# Patient Record
Sex: Female | Born: 1950 | Race: Black or African American | Hispanic: No | Smoking: Former smoker
Health system: Southern US, Community
[De-identification: ages and names within clinical notes are randomized; demographics above are authoritative.]

## PROBLEM LIST (undated history)

## (undated) DIAGNOSIS — I10 Essential (primary) hypertension: Secondary | ICD-10-CM

## (undated) DIAGNOSIS — M009 Pyogenic arthritis, unspecified: Secondary | ICD-10-CM

## (undated) DIAGNOSIS — E669 Obesity, unspecified: Secondary | ICD-10-CM

## (undated) DIAGNOSIS — E119 Type 2 diabetes mellitus without complications: Secondary | ICD-10-CM

## (undated) DIAGNOSIS — Z9889 Other specified postprocedural states: Secondary | ICD-10-CM

## (undated) DIAGNOSIS — N3281 Overactive bladder: Secondary | ICD-10-CM

## (undated) DIAGNOSIS — I639 Cerebral infarction, unspecified: Secondary | ICD-10-CM

## (undated) DIAGNOSIS — M1990 Unspecified osteoarthritis, unspecified site: Secondary | ICD-10-CM

## (undated) DIAGNOSIS — E785 Hyperlipidemia, unspecified: Secondary | ICD-10-CM

## (undated) DIAGNOSIS — G4733 Obstructive sleep apnea (adult) (pediatric): Secondary | ICD-10-CM

## (undated) DIAGNOSIS — Z973 Presence of spectacles and contact lenses: Secondary | ICD-10-CM

## (undated) DIAGNOSIS — R7303 Prediabetes: Secondary | ICD-10-CM

## (undated) HISTORY — DX: Obesity, unspecified: E66.9

## (undated) HISTORY — PX: TOTAL KNEE ARTHROPLASTY: SHX125

## (undated) HISTORY — DX: Hyperlipidemia, unspecified: E78.5

## (undated) HISTORY — DX: Essential (primary) hypertension: I10

## (undated) HISTORY — PX: JOINT REPLACEMENT: SHX530

## (undated) HISTORY — DX: Other specified postprocedural states: Z98.890

## (undated) HISTORY — DX: Obstructive sleep apnea (adult) (pediatric): G47.33

## (undated) HISTORY — PX: TUBAL LIGATION: SHX77

## (undated) HISTORY — DX: Type 2 diabetes mellitus without complications: E11.9

## (undated) HISTORY — DX: Cerebral infarction, unspecified: I63.9

## (undated) HISTORY — DX: Pyogenic arthritis, unspecified: M00.9

## (undated) HISTORY — PX: COLONOSCOPY: SHX174

## (undated) HISTORY — PX: DIAGNOSTIC LAPAROSCOPY: SUR761

## (undated) HISTORY — PX: OTHER SURGICAL HISTORY: SHX169

---

## 1999-07-27 ENCOUNTER — Encounter: Payer: Self-pay | Admitting: Sports Medicine

## 1999-07-27 ENCOUNTER — Ambulatory Visit (HOSPITAL_COMMUNITY): Admission: RE | Admit: 1999-07-27 | Discharge: 1999-07-27 | Payer: Self-pay | Admitting: Sports Medicine

## 2001-08-09 ENCOUNTER — Ambulatory Visit (HOSPITAL_COMMUNITY): Admission: RE | Admit: 2001-08-09 | Discharge: 2001-08-09 | Payer: Self-pay | Admitting: Family Medicine

## 2001-08-15 ENCOUNTER — Encounter: Payer: Self-pay | Admitting: Family Medicine

## 2001-08-15 ENCOUNTER — Encounter: Admission: RE | Admit: 2001-08-15 | Discharge: 2001-08-15 | Payer: Self-pay | Admitting: Family Medicine

## 2004-06-21 ENCOUNTER — Emergency Department (HOSPITAL_COMMUNITY): Admission: EM | Admit: 2004-06-21 | Discharge: 2004-06-21 | Payer: Self-pay | Admitting: Emergency Medicine

## 2005-09-17 ENCOUNTER — Emergency Department (HOSPITAL_COMMUNITY): Admission: EM | Admit: 2005-09-17 | Discharge: 2005-09-17 | Payer: Self-pay | Admitting: Family Medicine

## 2005-10-29 ENCOUNTER — Encounter: Admission: RE | Admit: 2005-10-29 | Discharge: 2005-10-29 | Payer: Self-pay | Admitting: Orthopedic Surgery

## 2006-03-31 ENCOUNTER — Ambulatory Visit (HOSPITAL_BASED_OUTPATIENT_CLINIC_OR_DEPARTMENT_OTHER): Admission: RE | Admit: 2006-03-31 | Discharge: 2006-03-31 | Payer: Self-pay | Admitting: Orthopedic Surgery

## 2006-05-10 ENCOUNTER — Ambulatory Visit: Payer: Self-pay

## 2008-11-14 ENCOUNTER — Encounter: Admission: RE | Admit: 2008-11-14 | Discharge: 2008-11-14 | Payer: Self-pay | Admitting: Family Medicine

## 2008-11-29 ENCOUNTER — Encounter: Admission: RE | Admit: 2008-11-29 | Discharge: 2009-01-27 | Payer: Self-pay | Admitting: Sports Medicine

## 2009-07-30 ENCOUNTER — Inpatient Hospital Stay (HOSPITAL_COMMUNITY): Admission: RE | Admit: 2009-07-30 | Discharge: 2009-08-04 | Payer: Self-pay | Admitting: Orthopedic Surgery

## 2009-09-11 ENCOUNTER — Encounter: Admission: RE | Admit: 2009-09-11 | Discharge: 2009-11-06 | Payer: Self-pay | Admitting: Orthopedic Surgery

## 2009-10-15 ENCOUNTER — Other Ambulatory Visit: Admission: RE | Admit: 2009-10-15 | Discharge: 2009-10-15 | Payer: Self-pay | Admitting: Family Medicine

## 2009-10-21 ENCOUNTER — Encounter: Admission: RE | Admit: 2009-10-21 | Discharge: 2009-10-21 | Payer: Self-pay | Admitting: Family Medicine

## 2009-10-24 ENCOUNTER — Encounter: Admission: RE | Admit: 2009-10-24 | Discharge: 2009-10-24 | Payer: Self-pay | Admitting: Family Medicine

## 2009-11-12 ENCOUNTER — Encounter: Admission: RE | Admit: 2009-11-12 | Discharge: 2009-11-12 | Payer: Self-pay | Admitting: Family Medicine

## 2010-04-22 ENCOUNTER — Other Ambulatory Visit: Payer: Self-pay | Admitting: Family Medicine

## 2010-04-22 ENCOUNTER — Ambulatory Visit
Admission: RE | Admit: 2010-04-22 | Discharge: 2010-04-22 | Disposition: A | Discharge status: Home / Self Care | Payer: Self-pay | Source: Ambulatory Visit | Attending: Family Medicine | Admitting: Family Medicine

## 2010-04-22 DIAGNOSIS — R109 Unspecified abdominal pain: Secondary | ICD-10-CM

## 2010-05-03 LAB — CBC
HCT: 30.2 % — ABNORMAL LOW (ref 36.0–46.0)
HCT: 31.9 % — ABNORMAL LOW (ref 36.0–46.0)
Hemoglobin: 10.2 g/dL — ABNORMAL LOW (ref 12.0–15.0)
Hemoglobin: 10.6 g/dL — ABNORMAL LOW (ref 12.0–15.0)
Hemoglobin: 10.7 g/dL — ABNORMAL LOW (ref 12.0–15.0)
MCHC: 33.9 g/dL (ref 30.0–36.0)
MCHC: 34.1 g/dL (ref 30.0–36.0)
MCV: 83.6 fL (ref 78.0–100.0)
Platelets: 192 10*3/uL (ref 150–400)
Platelets: 244 10*3/uL (ref 150–400)
RBC: 3.58 MIL/uL — ABNORMAL LOW (ref 3.87–5.11)
RBC: 3.64 MIL/uL — ABNORMAL LOW (ref 3.87–5.11)
RBC: 3.75 MIL/uL — ABNORMAL LOW (ref 3.87–5.11)
RDW: 14.3 % (ref 11.5–15.5)
WBC: 7.7 10*3/uL (ref 4.0–10.5)
WBC: 8.2 10*3/uL (ref 4.0–10.5)
WBC: 9 10*3/uL (ref 4.0–10.5)

## 2010-05-03 LAB — BASIC METABOLIC PANEL
BUN: 5 mg/dL — ABNORMAL LOW (ref 6–23)
BUN: 7 mg/dL (ref 6–23)
CO2: 25 mEq/L (ref 19–32)
Calcium: 8.5 mg/dL (ref 8.4–10.5)
Calcium: 8.6 mg/dL (ref 8.4–10.5)
Calcium: 9 mg/dL (ref 8.4–10.5)
Chloride: 102 mEq/L (ref 96–112)
Creatinine, Ser: 0.64 mg/dL (ref 0.4–1.2)
Creatinine, Ser: 0.68 mg/dL (ref 0.4–1.2)
Creatinine, Ser: 0.73 mg/dL (ref 0.4–1.2)
GFR calc Af Amer: >60 mL/min (ref >60)
GFR calc Af Amer: >60 mL/min (ref >60)
GFR calc Af Amer: >60 mL/min (ref >60)
GFR calc non Af Amer: >60 mL/min (ref >60)
GFR calc non Af Amer: >60 mL/min (ref >60)
GFR calc non Af Amer: >60 mL/min (ref >60)
GFR calc non Af Amer: >60 mL/min (ref >60)
Glucose, Bld: 138 mg/dL — ABNORMAL HIGH (ref 70–99)
Glucose, Bld: 156 mg/dL — ABNORMAL HIGH (ref 70–99)
Potassium: 3.4 mEq/L — ABNORMAL LOW (ref 3.5–5.1)
Potassium: 3.8 mEq/L (ref 3.5–5.1)
Potassium: 3.8 mEq/L (ref 3.5–5.1)
Potassium: 4 mEq/L (ref 3.5–5.1)
Sodium: 137 mEq/L (ref 135–145)
Sodium: 137 mEq/L (ref 135–145)
Sodium: 138 mEq/L (ref 135–145)
Sodium: 139 mEq/L (ref 135–145)

## 2010-05-03 LAB — PROTIME-INR
INR: 1.39 (ref 0.00–1.49)
INR: 1.46 (ref 0.00–1.49)
INR: 1.79 — ABNORMAL HIGH (ref 0.00–1.49)
Prothrombin Time: 16.9 seconds — ABNORMAL HIGH (ref 11.6–15.2)
Prothrombin Time: 17.6 seconds — ABNORMAL HIGH (ref 11.6–15.2)
Prothrombin Time: 18.3 seconds — ABNORMAL HIGH (ref 11.6–15.2)

## 2010-05-03 LAB — URINE CULTURE: Culture: NO GROWTH

## 2010-05-04 LAB — COMPREHENSIVE METABOLIC PANEL
Albumin: 4 g/dL (ref 3.5–5.2)
Alkaline Phosphatase: 95 U/L (ref 39–117)
BUN: 7 mg/dL (ref 6–23)
CO2: 25 mEq/L (ref 19–32)
Chloride: 107 mEq/L (ref 96–112)
Creatinine, Ser: 0.68 mg/dL (ref 0.4–1.2)
GFR calc non Af Amer: >60 mL/min (ref >60)
Glucose, Bld: 99 mg/dL (ref 70–99)
Potassium: 4.2 mEq/L (ref 3.5–5.1)
Total Bilirubin: 0.8 mg/dL (ref 0.3–1.2)

## 2010-05-04 LAB — URINALYSIS, ROUTINE W REFLEX MICROSCOPIC
Ketones, ur: NEGATIVE mg/dL
Nitrite: NEGATIVE
Specific Gravity, Urine: 1.017 (ref 1.005–1.030)
pH: 5 (ref 5.0–8.0)

## 2010-05-04 LAB — CBC
HCT: 41 % (ref 36.0–46.0)
Hemoglobin: 13.6 g/dL (ref 12.0–15.0)
MCV: 83.4 fL (ref 78.0–100.0)
Platelets: 253 10*3/uL (ref 150–400)
RBC: 4.92 MIL/uL (ref 3.87–5.11)
WBC: 6.2 10*3/uL (ref 4.0–10.5)

## 2010-05-04 LAB — URINE MICROSCOPIC-ADD ON

## 2010-05-04 LAB — TYPE AND SCREEN

## 2010-05-04 LAB — PROTIME-INR: INR: 0.99 (ref 0.00–1.49)

## 2010-06-30 NOTE — Procedures (Signed)
NAMEMERCEDEES, CONVERY             ACCOUNT NO.:  [id number]   MEDICAL RECORD NO.:  [id number]          PATIENT TYPE:  INP   LOCATION:  5025                         FACILITY:  [facility]   PHYSICIAN:  Loreta Ave, M.D. DATE OF BIRTH:  [birth year]   DATE OF PROCEDURE:  DATE OF DISCHARGE:  08/01/2009                    PERIPHERAL VASCULAR INVASIVE PROCEDURE   FINAL DIAGNOSES:  1. 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  2. Hypertension.  3. Morbid obesity.   HISTORY OF PRESENT ILLNESS:  A 60 year old white female with a history  of end-stage DJD left knee and chronic pain presented to our office for  a preop evaluation for left total knee replacement.  She had  progressively worsening pain with failure to respond with conservative  treatment.  Significant decrease in her daily activities due to the  ongoing complaint.   HOSPITAL COURSE:  On July 30, 2009, the patient was taken to the [facility] OR and a left total knee replacement procedure performed.  Surgeon,  Loreta Ave, M.D. and assistant Genene Churn. Barry Dienes, PA-C.  Anesthesia  general.  EBL 200 mL.  Tourniquet time 92 minutes.  One Hemovac drain  placed.  There were no surgical or anesthesia complications and the  patient was transferred to recovery in stable condition.  Pharmacy  protocol Coumadin started for DVT prophylaxis along with Lovenox bridge.  On July 31, 2009, patient doing well.  Pain controlled.  Vital signs  stable, afebrile.  WBC 7.7, hematocrit 31.9, hemoglobin 10.7, platelets  217, sodium 137, potassium 4.0, chloride 106, CO2 of 27, BUN 7,  creatinine 0.86, glucose 138, INR 1.07.  Dressing clean, dry and intact.  Neurovascularly intact.  The patient lives alone and needs skilled  nursing facility for rehab.  The patient did visit [facility] and  request to be transferred there if bed available.  Discontinued morphine  PCA.  PT/OT consults.  FL2 signed.  On  August 01, 2009, the patient doing  very well.  No complaints.  Vital signs stable, afebrile.  INR 1.39.  Urine culture showed no growth.  CBC and BMET pending.  Knee wound looks  good and staples intact.  No signs of infection.  Calf nontender  neurovascularly.  Hemovac drain removed.   DISPOSITION:  Transferred to skilled nursing facility.   CONDITION:  Good and stable.   MEDICATIONS:  1. Percocet 5/325 one to two tabs p.o. q.4-6 h., p.r.n. for pain.  2. Robaxin 500 mg 1 tab p.o. q.6 h., p.r.n. for spasms.  3. Coumadin per pharmacy protocol.  Maintain INR 2-3 x4 weeks      postoperative DVT prophylaxis.  4. Lovenox 30 mg 1 subcu injection b.i.d. and discontinue when      Coumadin is therapeutic with INR 2-3.  5. Simvastatin 40 mg 1 tab p.o. daily.  6. Lisinopril/hydrochlorothiazide 10/12.5 mg 1 tab p.o. daily.  7. Oxybutynin 5 mg 1 tab p.o. b.i.d.  8. Metoprolol tartrate 100 mg 1 tab p.o. b.i.d.   INSTRUCTIONS:  While at the skilled nursing facility, the patient will  continue PT/OT for knee  range of motion and strengthening and  ambulation.  She is weightbearing as tolerated.  Can start with a walker  and wean to a cane if tolerated.  Coumadin x4 weeks postoperatively for  DVT prophylaxis.  Discontinue Lovenox when the Coumadin is  therapeutic.  She is okay to shower, but no tub soaking.  Do not apply  any creams or ointments to her incision.  Staples to be removed 2 weeks  postop.  Daily dressing changes with 4 x 4 gauze and tape.  Follow up  with Dr. Eulah Pont when she is 2 weeks postop for recheck.  Call our office  at [phone] if there are any issues.      Genene Churn. Denton Meek.    ______________________________  Loreta Ave, M.D.    JMO/MEDQ  D:  08/01/2009  T:  08/01/2009  Job:  [id number]

## 2010-07-03 NOTE — Op Note (Signed)
[name], [name] [name]             ACCOUNT NO.:  [id number]   MEDICAL RECORD NO.:  [id number]          PATIENT TYPE:  AMB   LOCATION:  DSC                          FACILITY:  [facility]   PHYSICIAN:  Loreta Ave, M.D. 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   PROCEDURE PERFORMED:  Left knee examination under anesthesia,  arthroscopy, with partial medial meniscectomy.  Tricompartmental  debridement and removal of loose bodies and chondroplasty.   SURGEON:  Loreta Ave, M.D.   ASSISTANT:  Zonia Kief, P.A.   ANESTHESIA:  General.   ESTIMATED BLOOD LOSS:  Minimal.   TOURNIQUET TIME:  Not employed.   SPECIMENS:  None.   CULTURES:  None.   COMPLICATIONS:  None.   DRESSINGS:  Soft compressive.   DESCRIPTION OF PROCEDURE:  The patient was brought to the operating room  and placed on the operating table in supine position.  After adequate  anesthesia had been obtained, the knee examined.  Very limited by  exogenous obesity.  She does have full motion and reasonable stability  but with crepitus, mostly of the patellofemoral compartment.  Tourniquet  leg holder applied, the leg prepped and draped in the usual sterile  fashion.  Three portals created:  One superolateral, one each medial and  lateral parapatellar.  Inflow catheter introduced.  The knee was  distended, arthroscope introduced, knee inspected.  Grade 3 changes  diffusely medial compartment and patellofemoral joint.  Chondroplasty to  a stable surface.  Knocked down to grade 4.  The cruciate ligament was  intact.  Some spurring in both gutters and in the notch.  Lateral  meniscus, lateral compartment looked good.   Medial meniscus, a  detachment of the posterior horn, radial tear, irreparable.  This was  saucerized out and tapered in  smoothly, retaining a little bit of the rim in the back and tapered into  remaining meniscus.  At completion, all recesses examined to be sure all  loose fragments removed.  Instruments and fluid removed.  Portals in the  knee injected with Marcaine.  Portals closed with 4-0 nylon.  A sterile  compressive dressing applied.      Loreta Ave, M.D.  Electronically Signed     DFM/MEDQ  D:  03/31/2006  T:  04/01/2006  Job:  [id number]

## 2010-07-17 ENCOUNTER — Other Ambulatory Visit (HOSPITAL_COMMUNITY): Payer: Medicaid Other

## 2010-07-21 ENCOUNTER — Encounter (HOSPITAL_COMMUNITY)
Admission: RE | Admit: 2010-07-21 | Discharge: 2010-07-21 | Disposition: A | Discharge status: Home / Self Care | Payer: Medicaid Other | Source: Ambulatory Visit | Attending: Orthopedic Surgery | Admitting: Orthopedic Surgery

## 2010-07-21 ENCOUNTER — Ambulatory Visit (HOSPITAL_COMMUNITY)
Admission: RE | Admit: 2010-07-21 | Discharge: 2010-07-21 | Disposition: A | Discharge status: Home / Self Care | Payer: Medicaid Other | Source: Ambulatory Visit | Attending: Orthopedic Surgery | Admitting: Orthopedic Surgery

## 2010-07-21 ENCOUNTER — Other Ambulatory Visit (HOSPITAL_COMMUNITY): Payer: Self-pay | Admitting: Orthopedic Surgery

## 2010-07-21 DIAGNOSIS — IMO0002 Reserved for concepts with insufficient information to code with codable children: Secondary | ICD-10-CM | POA: Insufficient documentation

## 2010-07-21 DIAGNOSIS — M1711 Unilateral primary osteoarthritis, right knee: Secondary | ICD-10-CM

## 2010-07-21 DIAGNOSIS — Z01818 Encounter for other preprocedural examination: Secondary | ICD-10-CM | POA: Insufficient documentation

## 2010-07-21 DIAGNOSIS — M1710 Unilateral primary osteoarthritis, unspecified knee: Secondary | ICD-10-CM | POA: Insufficient documentation

## 2010-07-21 DIAGNOSIS — Z01810 Encounter for preprocedural cardiovascular examination: Secondary | ICD-10-CM | POA: Insufficient documentation

## 2010-07-21 DIAGNOSIS — Z01812 Encounter for preprocedural laboratory examination: Secondary | ICD-10-CM | POA: Insufficient documentation

## 2010-07-21 LAB — COMPREHENSIVE METABOLIC PANEL
ALT: 20 U/L (ref 0–35)
AST: 19 U/L (ref 0–37)
CO2: 28 mEq/L (ref 19–32)
Chloride: 106 mEq/L (ref 96–112)
Creatinine, Ser: 0.89 mg/dL (ref 0.4–1.2)
GFR calc Af Amer: >60 mL/min (ref >60)
GFR calc non Af Amer: >60 mL/min (ref >60)
Glucose, Bld: 119 mg/dL — ABNORMAL HIGH (ref 70–99)
Sodium: 144 mEq/L (ref 135–145)
Total Bilirubin: 0.3 mg/dL (ref 0.3–1.2)

## 2010-07-21 LAB — SURGICAL PCR SCREEN
MRSA, PCR: NEGATIVE
Staphylococcus aureus: POSITIVE — AB

## 2010-07-21 LAB — URINALYSIS, ROUTINE W REFLEX MICROSCOPIC
Bilirubin Urine: NEGATIVE
Ketones, ur: NEGATIVE mg/dL
Nitrite: NEGATIVE
Urobilinogen, UA: 0.2 mg/dL (ref 0.0–1.0)

## 2010-07-21 LAB — CBC
HCT: 41.3 % (ref 36.0–46.0)
Hemoglobin: 13.4 g/dL (ref 12.0–15.0)
MCH: 27.7 pg (ref 26.0–34.0)
RBC: 4.83 MIL/uL (ref 3.87–5.11)

## 2010-07-21 LAB — PROTIME-INR
INR: 0.91 (ref 0.00–1.49)
Prothrombin Time: 12.5 seconds (ref 11.6–15.2)

## 2010-07-21 LAB — APTT: aPTT: 27 seconds (ref 24–37)

## 2010-07-29 ENCOUNTER — Inpatient Hospital Stay (HOSPITAL_COMMUNITY): Payer: Medicaid Other

## 2010-07-29 ENCOUNTER — Inpatient Hospital Stay (HOSPITAL_COMMUNITY)
Admission: RE | Admit: 2010-07-29 | Discharge: 2010-08-01 | DRG: 470 | Disposition: A | Discharge status: Home Health | Payer: Medicaid Other | Source: Ambulatory Visit | Attending: Orthopedic Surgery | Admitting: Orthopedic Surgery

## 2010-07-29 DIAGNOSIS — G8929 Other chronic pain: Secondary | ICD-10-CM | POA: Diagnosis present

## 2010-07-29 DIAGNOSIS — I10 Essential (primary) hypertension: Secondary | ICD-10-CM | POA: Diagnosis present

## 2010-07-29 DIAGNOSIS — M1710 Unilateral primary osteoarthritis, unspecified knee: Principal | ICD-10-CM | POA: Diagnosis present

## 2010-07-29 DIAGNOSIS — E119 Type 2 diabetes mellitus without complications: Secondary | ICD-10-CM | POA: Diagnosis present

## 2010-07-29 DIAGNOSIS — E7800 Pure hypercholesterolemia, unspecified: Secondary | ICD-10-CM | POA: Diagnosis present

## 2010-07-29 LAB — TYPE AND SCREEN
ABO/RH(D): A POS
Antibody Screen: NEGATIVE

## 2010-07-29 LAB — URINALYSIS, ROUTINE W REFLEX MICROSCOPIC
Nitrite: NEGATIVE
Protein, ur: NEGATIVE mg/dL
Specific Gravity, Urine: 1.017 (ref 1.005–1.030)
Urobilinogen, UA: 0.2 mg/dL (ref 0.0–1.0)

## 2010-07-29 LAB — URINE MICROSCOPIC-ADD ON

## 2010-07-30 LAB — BASIC METABOLIC PANEL
BUN: 14 mg/dL (ref 6–23)
Chloride: 106 mEq/L (ref 96–112)
GFR calc Af Amer: >60 mL/min (ref >60)
GFR calc non Af Amer: >60 mL/min (ref >60)
Potassium: 4.2 mEq/L (ref 3.5–5.1)
Sodium: 140 mEq/L (ref 135–145)

## 2010-07-30 LAB — CBC
Hemoglobin: 11.3 g/dL — ABNORMAL LOW (ref 12.0–15.0)
MCH: 27.6 pg (ref 26.0–34.0)
MCV: 87 fL (ref 78.0–100.0)
Platelets: 253 10*3/uL (ref 150–400)
RBC: 4.09 MIL/uL (ref 3.87–5.11)
WBC: 9.2 10*3/uL (ref 4.0–10.5)

## 2010-07-30 LAB — PROTIME-INR: Prothrombin Time: 13.6 seconds (ref 11.6–15.2)

## 2010-07-31 LAB — BASIC METABOLIC PANEL
Calcium: 9.2 mg/dL (ref 8.4–10.5)
Creatinine, Ser: 0.68 mg/dL (ref 0.50–1.10)
GFR calc Af Amer: >60 mL/min (ref >60)
GFR calc non Af Amer: >60 mL/min (ref >60)
Sodium: 138 mEq/L (ref 135–145)

## 2010-07-31 LAB — PROTIME-INR
INR: 1.29 (ref 0.00–1.49)
Prothrombin Time: 16.3 seconds — ABNORMAL HIGH (ref 11.6–15.2)

## 2010-07-31 LAB — CBC
HCT: 35.1 % — ABNORMAL LOW (ref 36.0–46.0)
Hemoglobin: 11.2 g/dL — ABNORMAL LOW (ref 12.0–15.0)
MCH: 27.8 pg (ref 26.0–34.0)
MCHC: 31.9 g/dL (ref 30.0–36.0)
MCV: 87.1 fL (ref 78.0–100.0)
RDW: 15.3 % (ref 11.5–15.5)

## 2010-08-01 LAB — URINE CULTURE
Colony Count: >=100000
Culture  Setup Time: 201206130951

## 2010-08-01 LAB — CBC
Platelets: 231 10*3/uL (ref 150–400)
RBC: 4.07 MIL/uL (ref 3.87–5.11)
RDW: 15.5 % (ref 11.5–15.5)
WBC: 8.5 10*3/uL (ref 4.0–10.5)

## 2010-08-01 LAB — BASIC METABOLIC PANEL
Calcium: 9.9 mg/dL (ref 8.4–10.5)
GFR calc Af Amer: >60 mL/min (ref >60)
GFR calc non Af Amer: >60 mL/min (ref >60)
Potassium: 3.9 mEq/L (ref 3.5–5.1)
Sodium: 141 mEq/L (ref 135–145)

## 2010-08-01 LAB — PROTIME-INR: Prothrombin Time: 16.9 seconds — ABNORMAL HIGH (ref 11.6–15.2)

## 2010-08-06 NOTE — Op Note (Signed)
[name] [name], [name] [name] NO.:  [id number]  MEDICAL RECORD NO.:  [id number]  LOCATION:  5012                         FACILITY:  [facility]  PHYSICIAN:  Loreta Ave, M.D. DATE OF BIRTH:  14-Jun-1950  DATE OF PROCEDURE:  07/29/2010 DATE OF DISCHARGE:                              OPERATIVE REPORT   PREOPERATIVE DIAGNOSES:  Right knee end-stage degenerative arthritis, varus alignment, mild flexion contraction, morbid obesity.  POSTOPERATIVE DIAGNOSES:  Right knee end-stage degenerative arthritis, varus alignment, mild flexion contraction, morbid obesity.  PROCEDURES:  Right knee modified minimally invasive total knee replacement Stryker triathlon prosthesis.  Cemented pegged posterior stabilized #3 femoral component.  Cemented #3 tibial component 9-mm polyethylene insert.  Cemented resurfacing 32-mm patellar component. Soft tissue balancing.  SURGEON:  Loreta Ave, MD  ASSISTANT:  Zonia Kief, PA present throughout the entire case and necessary for timely completion of procedure.  ANESTHESIA:  General.  ESTIMATED BLOOD LOSS:  Minimal.  SPECIMENS:  None.  CULTURES:  None.  COMPLICATIONS:  None.  DRESSINGS:  Soft compressive knee immobilizer.  DRAIN:  Hemovac x1.  TOURNIQUET TIME:  1 hour and 10 minutes.  PROCEDURE:  The patient was brought to the operating room, placed on the operating table in a supine position.  After adequate anesthesia had been obtained, I examined the left knee that I had already replaced. Full extension, 120 degrees of flexion, good alignment, and stability. Attention turned to the right.  All positioning approach of operative procedure made more difficult by her morbid obesity.  Tourniquet applied.  Prepped and draped in usual sterile fashion.  Exsanguinated with elevation Esmarch.  Tourniquet was inflated to 350 mmHg.  Straight incision above the patella down to the tibial tubercle.  Medial arthrotomy vastus splitting  preserving quad tendon.  Knee exposed. Grade 4 changes throughout.  Medial capsular release.  Distal femur exposed.  10-mm resection, 5 degrees of valgus with the intramedullary guide.  Using epicondylar axis sized, cut, and fitted for posterior stabilized #3 component.  Attention turned to the tibia.  Extramedullary guide.  3-degree posterior slope cut below the defect medially for a 9- mm insert.  After that was complete, the knee was irrigated throughout. All recesses were explored to be sure all fragments of menisci, loose body, and cruciate ligaments removed.  I looked under gap in flexion/extension, which was nicely balanced.  Attention turned to the patella.  Periarticular spurs removed.  Posterior 10 mm removed. Drilled, sized, and fitted for a 32-mm component.  Wound irrigated. Trials put in place.  #3 above and below and a 9-mm insert and a 32 patella.  With this I had a nice mechanical axis full extension, full flexion, good patellofemoral tracking, good balancing, flexion/extension.  Tibia was marked for rotation and reamed.  All trials removed.  Copious irrigation with a pulse irrigating device. Cement prepared, placed on all components firmly seated.  Polyethylene attached to the tibia, knee reduced.  Patella with clamp.  Once cement hardened, the knee was reexamined.  Again pleased with alignment, stability, and motion.  Wound was irrigated once again.  Hemovac placed and brought out through a separate stab wound.  Arthrotomy closed with #1 Vicryl.  Skin and subcutaneous tissue with Vicryl and staples. Sterile compressive dressing applied.  Tourniquet was inflated and removed.  Knee immobilizer applied.  Anesthesia reversed.  Brought to the recovery room.  Tolerated surgery well.  No complications.     Loreta Ave, M.D.     DFM/MEDQ  D:  07/29/2010  T:  07/30/2010  Job:  [id number]  Electronically Signed by Mckinley Jewel M.D. on 08/06/2010 04:11:12 PM

## 2010-09-14 ENCOUNTER — Ambulatory Visit: Payer: Medicaid Other | Attending: Orthopedic Surgery | Admitting: Physical Therapy

## 2010-09-14 ENCOUNTER — Ambulatory Visit: Payer: Medicaid Other | Admitting: Physical Therapy

## 2010-09-14 DIAGNOSIS — IMO0001 Reserved for inherently not codable concepts without codable children: Secondary | ICD-10-CM | POA: Insufficient documentation

## 2010-09-14 DIAGNOSIS — M25569 Pain in unspecified knee: Secondary | ICD-10-CM | POA: Insufficient documentation

## 2010-09-14 DIAGNOSIS — R262 Difficulty in walking, not elsewhere classified: Secondary | ICD-10-CM | POA: Insufficient documentation

## 2010-09-14 DIAGNOSIS — M25669 Stiffness of unspecified knee, not elsewhere classified: Secondary | ICD-10-CM | POA: Insufficient documentation

## 2010-09-14 DIAGNOSIS — Z96659 Presence of unspecified artificial knee joint: Secondary | ICD-10-CM | POA: Insufficient documentation

## 2010-09-22 ENCOUNTER — Ambulatory Visit: Payer: Medicaid Other | Attending: Orthopedic Surgery | Admitting: Physical Therapy

## 2010-09-22 DIAGNOSIS — R262 Difficulty in walking, not elsewhere classified: Secondary | ICD-10-CM | POA: Insufficient documentation

## 2010-09-22 DIAGNOSIS — M25669 Stiffness of unspecified knee, not elsewhere classified: Secondary | ICD-10-CM | POA: Insufficient documentation

## 2010-09-22 DIAGNOSIS — IMO0001 Reserved for inherently not codable concepts without codable children: Secondary | ICD-10-CM | POA: Insufficient documentation

## 2010-09-22 DIAGNOSIS — M25569 Pain in unspecified knee: Secondary | ICD-10-CM | POA: Insufficient documentation

## 2010-09-22 DIAGNOSIS — Z96659 Presence of unspecified artificial knee joint: Secondary | ICD-10-CM | POA: Insufficient documentation

## 2010-09-28 ENCOUNTER — Encounter: Payer: Medicaid Other | Admitting: Physical Therapy

## 2010-09-29 ENCOUNTER — Ambulatory Visit: Payer: Medicaid Other | Admitting: Physical Therapy

## 2010-10-02 ENCOUNTER — Ambulatory Visit: Payer: Medicaid Other | Admitting: Physical Therapy

## 2010-10-05 ENCOUNTER — Ambulatory Visit: Payer: Medicaid Other | Admitting: Physical Therapy

## 2010-10-09 ENCOUNTER — Encounter: Payer: Medicaid Other | Admitting: Physical Therapy

## 2010-10-12 ENCOUNTER — Ambulatory Visit: Payer: Medicaid Other | Admitting: Physical Therapy

## 2010-10-16 ENCOUNTER — Ambulatory Visit: Payer: Medicaid Other | Admitting: Physical Therapy

## 2010-10-20 ENCOUNTER — Encounter: Payer: Medicaid Other | Admitting: Physical Therapy

## 2010-10-21 ENCOUNTER — Ambulatory Visit: Payer: Medicaid Other | Attending: Orthopedic Surgery | Admitting: Physical Therapy

## 2010-10-21 ENCOUNTER — Encounter (INDEPENDENT_AMBULATORY_CARE_PROVIDER_SITE_OTHER): Payer: Medicaid Other | Admitting: Ophthalmology

## 2010-10-21 DIAGNOSIS — M545 Low back pain, unspecified: Secondary | ICD-10-CM | POA: Insufficient documentation

## 2010-10-21 DIAGNOSIS — IMO0001 Reserved for inherently not codable concepts without codable children: Secondary | ICD-10-CM | POA: Insufficient documentation

## 2010-10-21 DIAGNOSIS — M25569 Pain in unspecified knee: Secondary | ICD-10-CM | POA: Insufficient documentation

## 2010-10-21 DIAGNOSIS — R5381 Other malaise: Secondary | ICD-10-CM | POA: Insufficient documentation

## 2010-10-21 DIAGNOSIS — M25669 Stiffness of unspecified knee, not elsewhere classified: Secondary | ICD-10-CM | POA: Insufficient documentation

## 2010-10-21 DIAGNOSIS — R262 Difficulty in walking, not elsewhere classified: Secondary | ICD-10-CM | POA: Insufficient documentation

## 2010-10-22 ENCOUNTER — Encounter (INDEPENDENT_AMBULATORY_CARE_PROVIDER_SITE_OTHER): Payer: Medicaid Other | Admitting: Ophthalmology

## 2010-10-22 DIAGNOSIS — H3530 Unspecified macular degeneration: Secondary | ICD-10-CM

## 2010-10-22 DIAGNOSIS — H2510 Age-related nuclear cataract, unspecified eye: Secondary | ICD-10-CM

## 2010-10-22 DIAGNOSIS — H43819 Vitreous degeneration, unspecified eye: Secondary | ICD-10-CM

## 2010-10-27 ENCOUNTER — Ambulatory Visit: Payer: Medicaid Other | Admitting: Physical Therapy

## 2010-10-27 NOTE — Discharge Summary (Signed)
NAMEADJOA, ALTHOUSE NO.:  [id number]  MEDICAL RECORD NO.:  [id number]  LOCATION:  5012                         FACILITY:  [facility]  PHYSICIAN:  Loreta Ave, M.D. DATE OF BIRTH:  [birth year]  DATE OF ADMISSION:  07/29/2010 DATE OF DISCHARGE:  08/01/2010                              DISCHARGE SUMMARY   FINAL DIAGNOSES: 1. Status post right total knee replacement for end-stage degenerative     joint disease. 2. Hypertension. 3. Type 2 diabetes.  HISTORY OF PRESENT ILLNESS:  A 60 year old black female with history of end-stage DJD of right knee and chronic pain presented to our office for preop evaluation for total knee replacement.  She had progressive and worsening pain with failure to response with conservative treatment. She had significant decrease in her daily activities due to the ongoing complaint.  HOSPITAL COURSE:  On July 29, 2010, the patient was taken to the [facility] OR, had a right total knee replacement procedure performed. Surgeon was Mckinley Jewel, MD and assistant, Zonia Kief PA-C. Anesthesia general.  No specimens.  EBL minimal.  Tourniquet time 79 minutes and one Hemovac drain placed.  There were no surgical or anesthesia complications and the patient was transferred to recovery room in stable condition.  After the recovery room, the patient was then transferred to the orthopedic unit and pharmacy protocol Coumadin and Lovenox started for DVT prophylaxis.  On July 30, 2010, the patient complained of radiating pain from her right buttock to her knee. Temperature 100.3, pulse 105, respirations 20, and blood pressure 124/68.  Hemoglobin 11.3.  INR 1.02.  Dressing clean, dry, and intact. Calf nontender, neurovascularly intact.  Skin was warm and dry.  The patient states that she does have a history of sciatica and we will see how she does with position changes, discontinue PCA, PT/OT consults.  On July 31, 2010, the patient moved into  rehab.  Pain controlled. Temperature 98.6.  Hemoglobin 11.2.  INR 1.29.  The wound looks good and staples intact.  No drainage or signs of infection.  Calf nontender, neurovascularly intact.  Skin is warm and dry.  Discontinued Foley. Encouraged the patient to work hard with rehab.  Later in afternoon, she greatly improved.  On August 01, 2010, the patient was doing well with good pain control.  Excellent progress with therapy and she is ready to discharge home.  Vital signs stable, afebrile.  Knee wound looks good and staples intact.  There are no drains or sign of infection.  Calf nontender, neurovascularly intact.  Skin was warm and dry.  DISPOSITION:  Discharged home.  CONDITION:  Good and stable.  DISCHARGE MEDICATIONS: 1. Percocet 5/325 one to two tabs p.o. q.4-6 hours p.r.n. for pain. 2. Robaxin 500 mg 1 tablet p.o. q.6 h p.r.n. for spasms. 3. Lovenox 30 mg 1 subcu injection q.12 h and discontinue when     Coumadin is therapeutic with INR 2-3. 4. Coumadin protocol for home health agency. 5. Bystolic 10 mg 1 tablet p.o. daily. 6. Simvastatin 20 mg 1 tablet p.o. daily.  INSTRUCTIONS:  The patient will work with her home health PT and OT to improve ambulation and knee range of motion and  strengthening. Weightbear as tolerated.  Daily dressing changes with 4x4 gauze and TED hose.  She is okay to shower, but no tub soaking.  Coumadin x4 weeks postop DVT prophylaxis.  Discontinue Lovenox when Coumadin is therapeutic with INR 2-3.  Follow up when she is 2 weeks postop for recheck and possible staple removal.  Return sooner if needed.     Genene Churn. Denton Meek.   ______________________________ Loreta Ave, M.D.    JMO/MEDQ  D:  09/23/2010  T:  09/24/2010  Job:  [id number]  Electronically Signed by Zonia Kief P.A. on 10/10/2010 06:46:53 PM Electronically Signed by Mckinley Jewel M.D. on 10/27/2010 02:45:56 PM

## 2010-10-29 ENCOUNTER — Ambulatory Visit: Payer: Medicaid Other | Admitting: Physical Therapy

## 2010-11-04 ENCOUNTER — Encounter: Payer: Medicaid Other | Admitting: Physical Therapy

## 2010-11-06 ENCOUNTER — Encounter: Payer: Medicaid Other | Admitting: Physical Therapy

## 2010-11-10 ENCOUNTER — Encounter: Payer: Medicaid Other | Admitting: Physical Therapy

## 2010-11-13 ENCOUNTER — Encounter: Payer: Medicaid Other | Admitting: Physical Therapy

## 2011-01-20 ENCOUNTER — Other Ambulatory Visit: Payer: Self-pay | Admitting: Sports Medicine

## 2011-01-20 DIAGNOSIS — M545 Low back pain: Secondary | ICD-10-CM

## 2011-01-22 ENCOUNTER — Ambulatory Visit
Admission: RE | Admit: 2011-01-22 | Discharge: 2011-01-22 | Disposition: A | Discharge status: Home / Self Care | Payer: Medicaid Other | Source: Ambulatory Visit | Attending: Sports Medicine | Admitting: Sports Medicine

## 2011-01-22 DIAGNOSIS — M545 Low back pain: Secondary | ICD-10-CM

## 2011-02-18 ENCOUNTER — Ambulatory Visit: Payer: Medicaid Other | Attending: Physical Medicine and Rehabilitation

## 2011-02-18 DIAGNOSIS — IMO0001 Reserved for inherently not codable concepts without codable children: Secondary | ICD-10-CM | POA: Insufficient documentation

## 2011-02-18 DIAGNOSIS — M545 Low back pain, unspecified: Secondary | ICD-10-CM | POA: Insufficient documentation

## 2011-02-18 DIAGNOSIS — Z96659 Presence of unspecified artificial knee joint: Secondary | ICD-10-CM | POA: Insufficient documentation

## 2011-02-18 DIAGNOSIS — R293 Abnormal posture: Secondary | ICD-10-CM | POA: Insufficient documentation

## 2011-02-18 DIAGNOSIS — R5381 Other malaise: Secondary | ICD-10-CM | POA: Insufficient documentation

## 2011-02-18 DIAGNOSIS — M25659 Stiffness of unspecified hip, not elsewhere classified: Secondary | ICD-10-CM | POA: Insufficient documentation

## 2011-02-23 ENCOUNTER — Ambulatory Visit: Payer: Medicaid Other | Admitting: Physical Therapy

## 2011-02-24 ENCOUNTER — Ambulatory Visit: Payer: Medicaid Other | Admitting: Physical Therapy

## 2011-03-02 ENCOUNTER — Ambulatory Visit: Payer: Medicaid Other | Admitting: Physical Therapy

## 2011-03-05 ENCOUNTER — Ambulatory Visit: Payer: Medicaid Other

## 2011-10-22 ENCOUNTER — Encounter (INDEPENDENT_AMBULATORY_CARE_PROVIDER_SITE_OTHER): Payer: Medicaid Other | Admitting: Ophthalmology

## 2012-05-19 ENCOUNTER — Other Ambulatory Visit: Payer: Self-pay | Admitting: Family Medicine

## 2012-05-19 DIAGNOSIS — Z1231 Encounter for screening mammogram for malignant neoplasm of breast: Secondary | ICD-10-CM

## 2012-06-21 DIAGNOSIS — H4011X0 Primary open-angle glaucoma, stage unspecified: Secondary | ICD-10-CM | POA: Diagnosis not present

## 2012-06-27 ENCOUNTER — Ambulatory Visit
Admission: RE | Admit: 2012-06-27 | Discharge: 2012-06-27 | Disposition: A | Discharge status: Home / Self Care | Payer: Medicare Other | Source: Ambulatory Visit | Attending: Family Medicine | Admitting: Family Medicine

## 2012-06-27 DIAGNOSIS — Z1231 Encounter for screening mammogram for malignant neoplasm of breast: Secondary | ICD-10-CM

## 2012-07-06 DIAGNOSIS — Z6841 Body Mass Index (BMI) 40.0 and over, adult: Secondary | ICD-10-CM | POA: Diagnosis not present

## 2012-07-06 DIAGNOSIS — E119 Type 2 diabetes mellitus without complications: Secondary | ICD-10-CM | POA: Diagnosis not present

## 2012-07-06 DIAGNOSIS — I10 Essential (primary) hypertension: Secondary | ICD-10-CM | POA: Diagnosis not present

## 2012-07-06 DIAGNOSIS — E782 Mixed hyperlipidemia: Secondary | ICD-10-CM | POA: Diagnosis not present

## 2012-07-18 DIAGNOSIS — H4011X0 Primary open-angle glaucoma, stage unspecified: Secondary | ICD-10-CM | POA: Diagnosis not present

## 2012-08-08 DIAGNOSIS — Z6835 Body mass index (BMI) 35.0-35.9, adult: Secondary | ICD-10-CM | POA: Diagnosis not present

## 2012-08-08 DIAGNOSIS — M549 Dorsalgia, unspecified: Secondary | ICD-10-CM | POA: Diagnosis not present

## 2012-08-21 ENCOUNTER — Encounter: Payer: Medicare Other | Attending: Family Medicine | Admitting: *Deleted

## 2012-08-21 ENCOUNTER — Encounter: Payer: Self-pay | Admitting: *Deleted

## 2012-08-21 DIAGNOSIS — Z713 Dietary counseling and surveillance: Secondary | ICD-10-CM | POA: Insufficient documentation

## 2012-08-21 DIAGNOSIS — E669 Obesity, unspecified: Secondary | ICD-10-CM | POA: Insufficient documentation

## 2012-08-21 NOTE — Patient Instructions (Signed)
Plan:  Aim for 3 Carb Choices per meal (45 grams) +/- 1 either way  Aim for 0-1 Carbs per snack if hungry  Consider going to library to check out Arm Chair exercises  Book to try. Consider  increasing your activity level by doing Arm Chair exercises or dancing for 15-30 minutes daily as tolerated

## 2012-08-21 NOTE — Progress Notes (Signed)
  Medical Nutrition Therapy:  Appt start time: 0800 end time:  0900.  Assessment:  Primary concerns today: patient here for obesity. Lives alone, does own food shopping and preparation. 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 Now she substitute teaches at variety of schools. She hasn't been active lately but would like to sign up for the [facility] when she can afford it. She does some house cleaning as able with sore back. Tries to do some Arm Chair exercises at home.   MEDICATIONS: see list   DIETARY INTAKE: Usual eating pattern includes 2-3 meals and 1-2 snacks per day.  Everyday foods include fair variety of all food groups.  Avoided foods include coffee, alcohol and cigarettes, red meats.    24-hr recall:  B ( AM): skips or has sweetened cereal with whole or 1% milk OR 2 eggs, sausage or [place] bacon and 2 toast plain.   Snk ( AM): none, usually takes a nap L ( PM): none unless skipped breakfast Snk ( PM): none D ( PM): grilled lean meats with lots of vegetables with salad and home made dressing, occasionally a starch, water Snk ( PM): sandwich or salad around midnight Beverages: sweet tea, koolaid, water  Usual physical activity: limited with bad back, would like to swim if could afford to get to [facility] pool  Estimated energy needs: 1400 calories 158 g carbohydrates 105 g protein 39 g fat  Progress Towards Goal(s):  In progress.   Nutritional Diagnosis:  NB-1.1 Food and nutrition-related knowledge deficit As related to obesity.  As evidenced by BMI of 53.5    Intervention:  Nutrition counseling for weight loss education initiated. DiscussedCarb Counting and reading food labels as form of portion control. Also used My Plate Method as suggestion for decreasing meat and starch type foods while increasing vegetables. Also discussed benefits of increased activity with suggestions of ways to include daily with little or no cost. Plan to review food portion of education in more  detail at next visit. Plan:  Aim for 3 Carb Choices per meal (45 grams) +/- 1 either way  Aim for 0-1 Carbs per snack if hungry  Consider going to library to check out Arm Chair exercises  Book to try. Consider  increasing your activity level by doing Arm Chair exercises or dancing for 15-30 minutes daily as tolerated   Handouts given during visit include: Carb Counting and Food Label handouts Meal Plan Card Arm Chair Exercise Book ideas  Monitoring/Evaluation:  Dietary intake, exercise, and body weight in 4 week(s).

## 2012-09-06 DIAGNOSIS — M7750 Other enthesopathy of unspecified foot: Secondary | ICD-10-CM | POA: Diagnosis not present

## 2012-09-20 ENCOUNTER — Encounter: Payer: Medicare Other | Admitting: *Deleted

## 2012-10-12 ENCOUNTER — Ambulatory Visit: Payer: Medicare Other | Admitting: *Deleted

## 2012-11-01 DIAGNOSIS — M7750 Other enthesopathy of unspecified foot: Secondary | ICD-10-CM | POA: Diagnosis not present

## 2012-11-28 DIAGNOSIS — H35369 Drusen (degenerative) of macula, unspecified eye: Secondary | ICD-10-CM | POA: Diagnosis not present

## 2012-11-28 DIAGNOSIS — H4011X0 Primary open-angle glaucoma, stage unspecified: Secondary | ICD-10-CM | POA: Diagnosis not present

## 2012-12-01 ENCOUNTER — Emergency Department (INDEPENDENT_AMBULATORY_CARE_PROVIDER_SITE_OTHER)
Admission: EM | Admit: 2012-12-01 | Discharge: 2012-12-01 | Disposition: A | Discharge status: Home / Self Care | Payer: Medicare Other | Source: Home / Self Care | Attending: Family Medicine | Admitting: Family Medicine

## 2012-12-01 ENCOUNTER — Encounter (HOSPITAL_COMMUNITY): Payer: Self-pay | Admitting: Emergency Medicine

## 2012-12-01 ENCOUNTER — Emergency Department (INDEPENDENT_AMBULATORY_CARE_PROVIDER_SITE_OTHER): Payer: Medicare Other

## 2012-12-01 DIAGNOSIS — M5030 Other cervical disc degeneration, unspecified cervical region: Secondary | ICD-10-CM | POA: Diagnosis not present

## 2012-12-01 DIAGNOSIS — M5412 Radiculopathy, cervical region: Secondary | ICD-10-CM | POA: Diagnosis not present

## 2012-12-01 MED ORDER — IBUPROFEN 800 MG PO TABS
800.0000 mg | ORAL_TABLET | Freq: Once | ORAL | Status: AC
  Administered 2012-12-01: 800 mg via ORAL

## 2012-12-01 MED ORDER — PREDNISONE 20 MG PO TABS: 40.0000 mg | ORAL_TABLET | Freq: Every day | ORAL | Status: DC

## 2012-12-01 MED ORDER — PREDNISONE 20 MG PO TABS
60.0000 mg | ORAL_TABLET | Freq: Once | ORAL | Status: AC
  Administered 2012-12-01: 60 mg via ORAL

## 2012-12-01 MED ORDER — PREDNISONE 20 MG PO TABS
ORAL_TABLET | ORAL | Status: AC
  Filled 2012-12-01: qty 3

## 2012-12-01 MED ORDER — CYCLOBENZAPRINE HCL 10 MG PO TABS: 10.0000 mg | ORAL_TABLET | Freq: Three times a day (TID) | ORAL | Status: DC | PRN

## 2012-12-01 MED ORDER — IBUPROFEN 800 MG PO TABS
ORAL_TABLET | ORAL | Status: AC
  Filled 2012-12-01: qty 1

## 2012-12-01 MED ORDER — HYDROCODONE-ACETAMINOPHEN 5-325 MG PO TABS: 1.0000 | ORAL_TABLET | ORAL | Status: DC | PRN

## 2012-12-01 NOTE — ED Notes (Signed)
Pt  Reports  Neck  Pain  With  Pain  Radiating  Into  Arms  /  Shoulders  For   sev  Weeks    -  She  denys  Any  Chest  Pain    Or  Any  Shortness  Of  Breath       She  Ambulated  To  Room with a  Steady  Fluid  Gait      She  Is  Sitting  Upright on exam table    In no  Acute distress            Skin is   Warm   Dry

## 2012-12-01 NOTE — ED Provider Notes (Signed)
CSN: [id number]     Arrival date & time 12/01/12  1151 History   First MD Initiated Contact with Patient 12/01/12 1359     Chief Complaint  Patient presents with  . Torticollis   HPI: Patient is a 62 y.o. female presenting with shoulder pain. The history is provided by the patient.  Shoulder Pain This is a chronic problem. The problem occurs constantly. The problem has been gradually worsening. She has tried acetaminophen, a cold compress and rest for the symptoms. The treatment provided mild relief.  Pt reports long h/o neck and bil shoulder pain that flares up with over use. States she started a new job in January that requires a lot of heavy lifting. Since starting this job she has noticed increased episodes of the neck pain that radiates into her bil shoulders. Recently she moved and has been having worsening pain. States she can not lift her arms straight above her head d/t the pain. She admits to intermittent numbness and tingling in her bil hands but admits she has also had these symptoms on and off for several years as well. All of these symptoms have worsened and become persistent since the first of this month. An over the counter analgesic cream and ice packs have helped some but pain still persist.  Past Medical History  Diagnosis Date  . Hyperlipidemia   . Hypertension    Past Surgical History  Procedure Laterality Date  . Joint replacement     History reviewed. No pertinent family history. History  Substance Use Topics  . Smoking status: Former Smoker    Quit date: 08/01/1992  . Smokeless tobacco: Never Used  . Alcohol Use: No   OB History   Grav Para Term Preterm Abortions TAB SAB Ect Mult Living                 Review of Systems  All other systems reviewed and are negative.    Allergies  Review of patient's allergies indicates no known allergies.  Home Medications   Current Outpatient Rx  Name  Route  Sig  Dispense  Refill  . amLODipine-valsartan  (EXFORGE) 10-160 MG per tablet   Oral   Take 1 tablet by mouth daily.         [name] Kitchen aspirin 81 MG tablet   Oral   Take 81 mg by mouth daily.         [name] Kitchen atorvastatin (LIPITOR) 40 MG tablet   Oral   Take 40 mg by mouth daily.         . cyclobenzaprine (FLEXERIL) 10 MG tablet   Oral   Take 1 tablet (10 mg total) by mouth 3 (three) times daily as needed for muscle spasms.   30 tablet   0   . HYDROcodone-acetaminophen (NORCO/VICODIN) 5-325 MG per tablet   Oral   Take 1 tablet by mouth every 4 (four) hours as needed for pain.   10 tablet   0   . nebivolol (BYSTOLIC) 10 MG tablet   Oral   Take 10 mg by mouth daily.         . predniSONE (DELTASONE) 20 MG tablet   Oral   Take 2 tablets (40 mg total) by mouth daily. For 5 days   10 tablet   0    BP 145/53  Pulse 80  Temp(Src) 98 F (36.7 C) (Oral)  Resp 16  SpO2 98% Physical Exam  Constitutional: She is oriented to person, place, and time. She  appears well-developed and well-nourished.  Obese  HENT:  Head: Normocephalic and atraumatic.  Eyes: Conjunctivae are normal.  Cardiovascular: Normal rate.   Pulmonary/Chest: Effort normal.  Musculoskeletal:       Back:  TTP to bil lateral neck that radiates into bil shoulders. Grips equal bil. Mild pain w/ active ROM of neck.   Neurological: She is alert and oriented to person, place, and time. No cranial nerve deficit.  Skin: Skin is warm and dry.  Psychiatric: She has a normal mood and affect.    ED Course  Procedures (including critical care time) Labs Review Labs Reviewed - No data to display Imaging Review Dg Cervical Spine Complete  12/01/2012   CLINICAL DATA:  Neck pain with radicular symptoms  EXAM: CERVICAL SPINE  4+ VIEWS  COMPARISON:  None.  FINDINGS: Frontal, lateral, open-mouth odontoid, and bilateral oblique views were obtained. There is no fracture or spondylolisthesis. Prevertebral soft tissues and predental space regions are normal.  There are anterior  osteophytes at all levels. There is mild disc space narrowing at C5-6. There is no appreciable exit foraminal narrowing on the oblique views.  There is calcification in both carotid arteries.  IMPRESSION: Osteoarthritic change. No fracture or spondylolisthesis. Carotid artery atherosclerotic calcification bilaterally.   Electronically Signed   By: Bretta Bang M.D.   On: 12/01/2012 15:54    EKG Interpretation     Ventricular Rate:    PR Interval:    QRS Duration:   QT Interval:    QTC Calculation:   R Axis:     Text Interpretation:              MDM   1. Degenerative disc disease, cervical   2. Cervical radiculopathy     2-3 week h/o worsening bil neck and upper shoulder pain associated w/ intermittent tingling in her bil hands. Pt states has had all symptoms on/off for years but worse over the last 2 to 3 weeks since starting a new job that has been physically demanding. C-spine xrays suggest DDD. Likely cervical radiculopathy (acute on chronic). No upper extremity weakness but pain w/ passive and active ROM of both arms and shoulders. Will treat w/ Prednisone, Flexeril and a short course of Norco and encourage pt to arrange f/u w/ her orthopedists at [facility] for further evaluation. No indication for urgent imaging.     Leanne Chang, NP 12/01/12 1629  Medical screening examination/treatment/procedure(s) were performed by a resident physician or non-physician practitioner and as the supervising physician I was immediately available for consultation/collaboration.  Clementeen Graham, MD  Rodolph Bong, MD 12/02/12 [phone]

## 2012-12-05 ENCOUNTER — Telehealth (HOSPITAL_COMMUNITY): Payer: Self-pay | Admitting: *Deleted

## 2012-12-05 NOTE — ED Notes (Signed)
Pt. called and requested refills of Cyclobenzaprine, Prednisone and Hydrocodone from K. Schorr NP. Pt. states the pills help better than the shot she gets.  I explained that we do not do refills because we are not a primary care facility.  I asked if she had a PCP.   She said she has Dr. Clelia Croft and a back doctor at [facility] but could not say his name. I recommended she call them to handle her chronic back pain problem.  We would be glad to see her again if she can't get in to see them soon. Vassie Moselle 12/05/2012

## 2012-12-09 ENCOUNTER — Inpatient Hospital Stay (HOSPITAL_COMMUNITY)
Admission: EM | Admit: 2012-12-09 | Discharge: 2012-12-15 | DRG: 486 | Disposition: A | Discharge status: Skilled Nursing Facility | Payer: Medicare Other | Attending: Orthopedic Surgery | Admitting: Orthopedic Surgery

## 2012-12-09 ENCOUNTER — Emergency Department (HOSPITAL_COMMUNITY): Payer: Medicare Other

## 2012-12-09 ENCOUNTER — Encounter (HOSPITAL_COMMUNITY): Payer: Self-pay | Admitting: Emergency Medicine

## 2012-12-09 ENCOUNTER — Inpatient Hospital Stay (HOSPITAL_COMMUNITY): Payer: Medicare Other | Admitting: Anesthesiology

## 2012-12-09 ENCOUNTER — Inpatient Hospital Stay (HOSPITAL_COMMUNITY): Payer: Medicare Other

## 2012-12-09 ENCOUNTER — Encounter (HOSPITAL_COMMUNITY)
Admission: EM | Disposition: A | Discharge status: Skilled Nursing Facility | Payer: Self-pay | Source: Home / Self Care | Attending: Orthopedic Surgery

## 2012-12-09 ENCOUNTER — Encounter (HOSPITAL_COMMUNITY): Payer: Medicare Other | Admitting: Anesthesiology

## 2012-12-09 DIAGNOSIS — Z96659 Presence of unspecified artificial knee joint: Secondary | ICD-10-CM

## 2012-12-09 DIAGNOSIS — L089 Local infection of the skin and subcutaneous tissue, unspecified: Secondary | ICD-10-CM | POA: Diagnosis not present

## 2012-12-09 DIAGNOSIS — M6750 Plica syndrome, unspecified knee: Secondary | ICD-10-CM | POA: Diagnosis not present

## 2012-12-09 DIAGNOSIS — E669 Obesity, unspecified: Secondary | ICD-10-CM | POA: Diagnosis present

## 2012-12-09 DIAGNOSIS — A4901 Methicillin susceptible Staphylococcus aureus infection, unspecified site: Secondary | ICD-10-CM | POA: Diagnosis present

## 2012-12-09 DIAGNOSIS — M79609 Pain in unspecified limb: Secondary | ICD-10-CM | POA: Diagnosis not present

## 2012-12-09 DIAGNOSIS — R7881 Bacteremia: Secondary | ICD-10-CM | POA: Diagnosis not present

## 2012-12-09 DIAGNOSIS — B999 Unspecified infectious disease: Secondary | ICD-10-CM | POA: Diagnosis not present

## 2012-12-09 DIAGNOSIS — N390 Urinary tract infection, site not specified: Secondary | ICD-10-CM | POA: Diagnosis not present

## 2012-12-09 DIAGNOSIS — Z79899 Other long term (current) drug therapy: Secondary | ICD-10-CM | POA: Diagnosis not present

## 2012-12-09 DIAGNOSIS — M009 Pyogenic arthritis, unspecified: Secondary | ICD-10-CM

## 2012-12-09 DIAGNOSIS — M6281 Muscle weakness (generalized): Secondary | ICD-10-CM | POA: Diagnosis not present

## 2012-12-09 DIAGNOSIS — Z7982 Long term (current) use of aspirin: Secondary | ICD-10-CM | POA: Diagnosis not present

## 2012-12-09 DIAGNOSIS — Z5189 Encounter for other specified aftercare: Secondary | ICD-10-CM | POA: Diagnosis not present

## 2012-12-09 DIAGNOSIS — T847XXA Infection and inflammatory reaction due to other internal orthopedic prosthetic devices, implants and grafts, initial encounter: Secondary | ICD-10-CM | POA: Diagnosis not present

## 2012-12-09 DIAGNOSIS — M25569 Pain in unspecified knee: Secondary | ICD-10-CM | POA: Diagnosis not present

## 2012-12-09 DIAGNOSIS — E785 Hyperlipidemia, unspecified: Secondary | ICD-10-CM

## 2012-12-09 DIAGNOSIS — T8459XA Infection and inflammatory reaction due to other internal joint prosthesis, initial encounter: Secondary | ICD-10-CM

## 2012-12-09 DIAGNOSIS — I10 Essential (primary) hypertension: Secondary | ICD-10-CM | POA: Diagnosis not present

## 2012-12-09 DIAGNOSIS — Z452 Encounter for adjustment and management of vascular access device: Secondary | ICD-10-CM | POA: Diagnosis not present

## 2012-12-09 DIAGNOSIS — T8450XA Infection and inflammatory reaction due to unspecified internal joint prosthesis, initial encounter: Secondary | ICD-10-CM | POA: Diagnosis not present

## 2012-12-09 DIAGNOSIS — Z6841 Body Mass Index (BMI) 40.0 and over, adult: Secondary | ICD-10-CM | POA: Diagnosis not present

## 2012-12-09 DIAGNOSIS — Z87891 Personal history of nicotine dependence: Secondary | ICD-10-CM | POA: Diagnosis not present

## 2012-12-09 DIAGNOSIS — Z0000 Encounter for general adult medical examination without abnormal findings: Secondary | ICD-10-CM | POA: Diagnosis not present

## 2012-12-09 DIAGNOSIS — I369 Nonrheumatic tricuspid valve disorder, unspecified: Secondary | ICD-10-CM | POA: Diagnosis not present

## 2012-12-09 DIAGNOSIS — Z87440 Personal history of urinary (tract) infections: Secondary | ICD-10-CM | POA: Diagnosis not present

## 2012-12-09 DIAGNOSIS — Z792 Long term (current) use of antibiotics: Secondary | ICD-10-CM | POA: Diagnosis not present

## 2012-12-09 DIAGNOSIS — Y831 Surgical operation with implant of artificial internal device as the cause of abnormal reaction of the patient, or of later complication, without mention of misadventure at the time of the procedure: Secondary | ICD-10-CM | POA: Diagnosis present

## 2012-12-09 DIAGNOSIS — Z471 Aftercare following joint replacement surgery: Secondary | ICD-10-CM | POA: Diagnosis not present

## 2012-12-09 HISTORY — PX: KNEE ARTHROSCOPY: SHX127

## 2012-12-09 LAB — URINALYSIS, ROUTINE W REFLEX MICROSCOPIC
Glucose, UA: NEGATIVE mg/dL
Ketones, ur: NEGATIVE mg/dL
Nitrite: POSITIVE — AB
Specific Gravity, Urine: 1.037 — ABNORMAL HIGH (ref 1.005–1.030)
Urobilinogen, UA: 1 mg/dL (ref 0.0–1.0)
pH: 5.5 (ref 5.0–8.0)

## 2012-12-09 LAB — GLUCOSE, SYNOVIAL FLUID: Glucose, Synovial Fluid: 44 mg/dL

## 2012-12-09 LAB — CBC
Hemoglobin: 12.9 g/dL (ref 12.0–15.0)
MCHC: 32.8 g/dL (ref 30.0–36.0)
Platelets: 241 10*3/uL (ref 150–400)
RBC: 4.54 MIL/uL (ref 3.87–5.11)
WBC: 17.4 10*3/uL — ABNORMAL HIGH (ref 4.0–10.5)

## 2012-12-09 LAB — CBC WITH DIFFERENTIAL/PLATELET
Basophils Relative: 0 % (ref 0–1)
Eosinophils Absolute: 0 10*3/uL (ref 0.0–0.7)
Hemoglobin: 13.6 g/dL (ref 12.0–15.0)
Lymphs Abs: 1.2 10*3/uL (ref 0.7–4.0)
MCH: 27.9 pg (ref 26.0–34.0)
MCHC: 32.8 g/dL (ref 30.0–36.0)
Monocytes Relative: 11 % (ref 3–12)
Neutro Abs: 13.9 10*3/uL — ABNORMAL HIGH (ref 1.7–7.7)
Neutrophils Relative %: 81 % — ABNORMAL HIGH (ref 43–77)
Platelets: 290 10*3/uL (ref 150–400)
RBC: 4.87 MIL/uL (ref 3.87–5.11)

## 2012-12-09 LAB — BASIC METABOLIC PANEL
BUN: 15 mg/dL (ref 6–23)
Chloride: 99 mEq/L (ref 96–112)
GFR calc Af Amer: >90 mL/min (ref >90)
GFR calc non Af Amer: >90 mL/min (ref >90)
Potassium: 4.1 mEq/L (ref 3.5–5.1)
Sodium: 135 mEq/L (ref 135–145)

## 2012-12-09 LAB — GRAM STAIN: Special Requests: NORMAL

## 2012-12-09 LAB — HEMOGLOBIN A1C: Mean Plasma Glucose: 143 mg/dL — ABNORMAL HIGH (ref <117)

## 2012-12-09 LAB — SEDIMENTATION RATE: Sed Rate: 26 mm/hr — ABNORMAL HIGH (ref 0–22)

## 2012-12-09 LAB — CREATININE, SERUM
Creatinine, Ser: 0.7 mg/dL (ref 0.50–1.10)
GFR calc Af Amer: >90 mL/min (ref >90)

## 2012-12-09 LAB — URINE MICROSCOPIC-ADD ON

## 2012-12-09 LAB — SYNOVIAL CELL COUNT + DIFF, W/ CRYSTALS
Crystals, Fluid: NONE SEEN
Lymphocytes-Synovial Fld: 2 % (ref 0–20)
Neutrophil, Synovial: 98 % — ABNORMAL HIGH (ref 0–25)
WBC, Synovial: 65300 /mm3 — ABNORMAL HIGH (ref 0–200)

## 2012-12-09 LAB — MRSA PCR SCREENING: MRSA by PCR: NEGATIVE

## 2012-12-09 LAB — GLUCOSE, CAPILLARY: Glucose-Capillary: 109 mg/dL — ABNORMAL HIGH (ref 70–99)

## 2012-12-09 LAB — C-REACTIVE PROTEIN: CRP: 6.1 mg/dL — ABNORMAL HIGH (ref <0.60)

## 2012-12-09 LAB — PROTIME-INR: INR: 1 (ref 0.00–1.49)

## 2012-12-09 LAB — GENTAMICIN LEVEL, RANDOM: Gentamicin Rm: 2 ug/mL

## 2012-12-09 SURGERY — ARTHROSCOPY, KNEE
Anesthesia: General | Site: Knee | Laterality: Right | Wound class: Contaminated

## 2012-12-09 MED ORDER — ALBUMIN HUMAN 5 % IV SOLN
INTRAVENOUS | Status: DC | PRN
  Administered 2012-12-09: 18:00:00 via INTRAVENOUS

## 2012-12-09 MED ORDER — METHOCARBAMOL 500 MG PO TABS
500.0000 mg | ORAL_TABLET | Freq: Four times a day (QID) | ORAL | Status: DC | PRN
  Administered 2012-12-10: 1000 mg via ORAL
  Administered 2012-12-11 – 2012-12-13 (×4): 500 mg via ORAL
  Filled 2012-12-09 (×5): qty 1
  Filled 2012-12-09: qty 2

## 2012-12-09 MED ORDER — METOCLOPRAMIDE HCL 5 MG/ML IJ SOLN: 5.0000 mg | Freq: Three times a day (TID) | INTRAMUSCULAR | Status: DC | PRN

## 2012-12-09 MED ORDER — VANCOMYCIN HCL 10 G IV SOLR
1500.0000 mg | Freq: Two times a day (BID) | INTRAVENOUS | Status: DC
  Administered 2012-12-10 (×2): 1500 mg via INTRAVENOUS
  Filled 2012-12-09 (×3): qty 1500

## 2012-12-09 MED ORDER — CEFAZOLIN SODIUM-DEXTROSE 2-3 GM-% IV SOLR
INTRAVENOUS | Status: AC
  Filled 2012-12-09: qty 50

## 2012-12-09 MED ORDER — AMLODIPINE BESYLATE-VALSARTAN 10-160 MG PO TABS: 1.0000 | ORAL_TABLET | Freq: Every day | ORAL | Status: DC

## 2012-12-09 MED ORDER — MORPHINE SULFATE 4 MG/ML IJ SOLN
INTRAMUSCULAR | Status: AC
  Filled 2012-12-09: qty 1

## 2012-12-09 MED ORDER — SODIUM CHLORIDE 0.9 % IR SOLN
Status: DC | PRN
  Administered 2012-12-09: 9000 mL

## 2012-12-09 MED ORDER — METHOCARBAMOL 100 MG/ML IJ SOLN
500.0000 mg | Freq: Four times a day (QID) | INTRAVENOUS | Status: DC | PRN
  Filled 2012-12-09: qty 5

## 2012-12-09 MED ORDER — HYDROMORPHONE HCL PF 1 MG/ML IJ SOLN
0.5000 mg | Freq: Once | INTRAMUSCULAR | Status: AC
  Administered 2012-12-09: 0.5 mg via INTRAVENOUS
  Filled 2012-12-09: qty 1

## 2012-12-09 MED ORDER — ESMOLOL HCL 10 MG/ML IV SOLN
INTRAVENOUS | Status: DC | PRN
  Administered 2012-12-09: 30 mg via INTRAVENOUS
  Administered 2012-12-09: 40 mg via INTRAVENOUS
  Administered 2012-12-09: 30 mg via INTRAVENOUS

## 2012-12-09 MED ORDER — LABETALOL HCL 5 MG/ML IV SOLN
INTRAVENOUS | Status: DC | PRN
  Administered 2012-12-09 (×2): 5 mg via INTRAVENOUS
  Administered 2012-12-09: 10 mg via INTRAVENOUS

## 2012-12-09 MED ORDER — VANCOMYCIN HCL 10 G IV SOLR
2500.0000 mg | INTRAVENOUS | Status: AC
  Administered 2012-12-09: 2500 mg via INTRAVENOUS
  Filled 2012-12-09 (×2): qty 2500

## 2012-12-09 MED ORDER — HYDROMORPHONE HCL PF 1 MG/ML IJ SOLN
0.5000 mg | INTRAMUSCULAR | Status: DC | PRN
  Administered 2012-12-10 (×2): 0.5 mg via INTRAVENOUS
  Filled 2012-12-09 (×3): qty 1

## 2012-12-09 MED ORDER — DIPHENHYDRAMINE HCL 12.5 MG/5ML PO ELIX: 12.5000 mg | ORAL_SOLUTION | ORAL | Status: DC | PRN

## 2012-12-09 MED ORDER — METOCLOPRAMIDE HCL 10 MG PO TABS: 5.0000 mg | ORAL_TABLET | Freq: Three times a day (TID) | ORAL | Status: DC | PRN

## 2012-12-09 MED ORDER — MORPHINE SULFATE 2 MG/ML IJ SOLN: 1.0000 mg | INTRAMUSCULAR | Status: DC | PRN

## 2012-12-09 MED ORDER — LIDOCAINE HCL (CARDIAC) 20 MG/ML IV SOLN
INTRAVENOUS | Status: DC | PRN
  Administered 2012-12-09: 60 mg via INTRAVENOUS

## 2012-12-09 MED ORDER — IRBESARTAN 150 MG PO TABS
150.0000 mg | ORAL_TABLET | Freq: Every day | ORAL | Status: DC
  Administered 2012-12-09 – 2012-12-15 (×7): 150 mg via ORAL
  Filled 2012-12-09 (×7): qty 1

## 2012-12-09 MED ORDER — ENOXAPARIN SODIUM 40 MG/0.4ML ~~LOC~~ SOLN
40.0000 mg | SUBCUTANEOUS | Status: DC
  Administered 2012-12-10 – 2012-12-15 (×6): 40 mg via SUBCUTANEOUS
  Filled 2012-12-09 (×6): qty 0.4

## 2012-12-09 MED ORDER — AMLODIPINE BESYLATE 10 MG PO TABS
10.0000 mg | ORAL_TABLET | Freq: Every day | ORAL | Status: DC
  Administered 2012-12-09 – 2012-12-15 (×7): 10 mg via ORAL
  Filled 2012-12-09 (×7): qty 1

## 2012-12-09 MED ORDER — GENTAMICIN SULFATE 40 MG/ML IJ SOLN
600.0000 mg | INTRAVENOUS | Status: DC
  Administered 2012-12-10: 600 mg via INTRAVENOUS
  Filled 2012-12-09: qty 15

## 2012-12-09 MED ORDER — METOPROLOL TARTRATE 1 MG/ML IV SOLN
INTRAVENOUS | Status: DC | PRN
  Administered 2012-12-09 (×5): 1 mg via INTRAVENOUS

## 2012-12-09 MED ORDER — LACTATED RINGERS IV SOLN
INTRAVENOUS | Status: DC
  Administered 2012-12-09: 100 mL/h via INTRAVENOUS

## 2012-12-09 MED ORDER — ATORVASTATIN CALCIUM 40 MG PO TABS
40.0000 mg | ORAL_TABLET | Freq: Every day | ORAL | Status: DC
  Administered 2012-12-09 – 2012-12-14 (×6): 40 mg via ORAL
  Filled 2012-12-09 (×7): qty 1

## 2012-12-09 MED ORDER — SUCCINYLCHOLINE CHLORIDE 20 MG/ML IJ SOLN
INTRAMUSCULAR | Status: DC | PRN
  Administered 2012-12-09: 100 mg via INTRAVENOUS

## 2012-12-09 MED ORDER — FENTANYL CITRATE 0.05 MG/ML IJ SOLN
INTRAMUSCULAR | Status: AC
  Filled 2012-12-09: qty 2

## 2012-12-09 MED ORDER — CEFAZOLIN SODIUM 1-5 GM-% IV SOLN
INTRAVENOUS | Status: AC
  Filled 2012-12-09: qty 50

## 2012-12-09 MED ORDER — DEXTROSE 5 % IV SOLN
3.0000 g | INTRAVENOUS | Status: DC | PRN
  Administered 2012-12-09: 3 g via INTRAVENOUS

## 2012-12-09 MED ORDER — CHLORHEXIDINE GLUCONATE 4 % EX LIQD
60.0000 mL | Freq: Once | CUTANEOUS | Status: AC
  Administered 2012-12-09: 4 via TOPICAL
  Filled 2012-12-09: qty 60

## 2012-12-09 MED ORDER — FENTANYL CITRATE 0.05 MG/ML IJ SOLN
25.0000 ug | INTRAMUSCULAR | Status: DC | PRN
  Administered 2012-12-09 (×2): 25 ug via INTRAVENOUS
  Administered 2012-12-09: 50 ug via INTRAVENOUS
  Administered 2012-12-09: 25 ug via INTRAVENOUS
  Administered 2012-12-09: 50 ug via INTRAVENOUS

## 2012-12-09 MED ORDER — ONDANSETRON HCL 4 MG/2ML IJ SOLN: 4.0000 mg | Freq: Four times a day (QID) | INTRAMUSCULAR | Status: DC | PRN

## 2012-12-09 MED ORDER — SODIUM CHLORIDE 0.9 % IV SOLN
INTRAVENOUS | Status: DC
  Administered 2012-12-09: 11:00:00 via INTRAVENOUS

## 2012-12-09 MED ORDER — BUPIVACAINE HCL (PF) 0.25 % IJ SOLN
INTRAMUSCULAR | Status: AC
  Filled 2012-12-09: qty 30

## 2012-12-09 MED ORDER — ACETAMINOPHEN 650 MG RE SUPP: 650.0000 mg | Freq: Four times a day (QID) | RECTAL | Status: DC | PRN

## 2012-12-09 MED ORDER — POTASSIUM CHLORIDE IN NACL 20-0.9 MEQ/L-% IV SOLN
INTRAVENOUS | Status: DC
  Administered 2012-12-09: 22:00:00 via INTRAVENOUS
  Administered 2012-12-10: 75 mL/h via INTRAVENOUS
  Filled 2012-12-09 (×9): qty 1000

## 2012-12-09 MED ORDER — GENTAMICIN SULFATE 40 MG/ML IJ SOLN
600.0000 mg | INTRAVENOUS | Status: AC
  Administered 2012-12-09: 600 mg via INTRAVENOUS
  Filled 2012-12-09 (×2): qty 15

## 2012-12-09 MED ORDER — PROPOFOL 10 MG/ML IV BOLUS
INTRAVENOUS | Status: DC | PRN
  Administered 2012-12-09: 50 mg via INTRAVENOUS
  Administered 2012-12-09: 150 mg via INTRAVENOUS

## 2012-12-09 MED ORDER — DOCUSATE SODIUM 100 MG PO CAPS
100.0000 mg | ORAL_CAPSULE | Freq: Two times a day (BID) | ORAL | Status: DC
  Administered 2012-12-09 – 2012-12-14 (×10): 100 mg via ORAL
  Filled 2012-12-09 (×13): qty 1

## 2012-12-09 MED ORDER — ONDANSETRON HCL 4 MG/2ML IJ SOLN: 4.0000 mg | Freq: Three times a day (TID) | INTRAMUSCULAR | Status: DC | PRN

## 2012-12-09 MED ORDER — NEBIVOLOL HCL 10 MG PO TABS
10.0000 mg | ORAL_TABLET | Freq: Every day | ORAL | Status: DC
  Administered 2012-12-09 – 2012-12-15 (×7): 10 mg via ORAL
  Filled 2012-12-09 (×7): qty 1

## 2012-12-09 MED ORDER — FENTANYL CITRATE 0.05 MG/ML IJ SOLN
INTRAMUSCULAR | Status: AC
  Administered 2012-12-09: 25 ug via INTRAVENOUS
  Filled 2012-12-09: qty 2

## 2012-12-09 MED ORDER — ONDANSETRON HCL 4 MG PO TABS: 4.0000 mg | ORAL_TABLET | Freq: Four times a day (QID) | ORAL | Status: DC | PRN

## 2012-12-09 MED ORDER — BUPIVACAINE HCL (PF) 0.25 % IJ SOLN
INTRAMUSCULAR | Status: DC | PRN
  Administered 2012-12-09: 10 mL

## 2012-12-09 MED ORDER — FENTANYL CITRATE 0.05 MG/ML IJ SOLN
INTRAMUSCULAR | Status: DC | PRN
  Administered 2012-12-09 (×2): 50 ug via INTRAVENOUS
  Administered 2012-12-09: 100 ug via INTRAVENOUS
  Administered 2012-12-09: 50 ug via INTRAVENOUS

## 2012-12-09 MED ORDER — ACETAMINOPHEN 325 MG PO TABS: 650.0000 mg | ORAL_TABLET | Freq: Four times a day (QID) | ORAL | Status: DC | PRN

## 2012-12-09 MED ORDER — HYDROMORPHONE HCL PF 1 MG/ML IJ SOLN
0.5000 mg | INTRAMUSCULAR | Status: DC | PRN
  Administered 2012-12-09 (×3): 0.5 mg via INTRAVENOUS
  Filled 2012-12-09 (×3): qty 1

## 2012-12-09 MED ORDER — SODIUM CHLORIDE 0.9 % IV SOLN
INTRAVENOUS | Status: DC | PRN
  Administered 2012-12-09 (×2): via INTRAVENOUS

## 2012-12-09 MED ORDER — HYDROCODONE-ACETAMINOPHEN 5-325 MG PO TABS
1.0000 | ORAL_TABLET | ORAL | Status: DC | PRN
  Administered 2012-12-09 – 2012-12-15 (×25): 2 via ORAL
  Filled 2012-12-09: qty 10
  Filled 2012-12-09 (×23): qty 2

## 2012-12-09 MED ORDER — HYDROMORPHONE HCL PF 1 MG/ML IJ SOLN: 1.0000 mg | INTRAMUSCULAR | Status: DC | PRN

## 2012-12-09 SURGICAL SUPPLY — 40 items
BANDAGE ELASTIC 6 VELCRO ST LF (GAUZE/BANDAGES/DRESSINGS) ×2 IMPLANT
BANDAGE ESMARK 6X9 LF (GAUZE/BANDAGES/DRESSINGS) ×1 IMPLANT
BANDAGE GAUZE ELAST BULKY 4 IN (GAUZE/BANDAGES/DRESSINGS) ×2 IMPLANT
BLADE CUDA 5.5 (BLADE) IMPLANT
BLADE GREAT WHITE 4.2 (BLADE) ×2 IMPLANT
BLADE SURG 11 STRL SS (BLADE) ×2 IMPLANT
BNDG ESMARK 6X9 LF (GAUZE/BANDAGES/DRESSINGS) ×2
BRUSH SCRUB DISP (MISCELLANEOUS) IMPLANT
CLOTH BEACON ORANGE TIMEOUT ST (SAFETY) ×2 IMPLANT
COVER SURGICAL LIGHT HANDLE (MISCELLANEOUS) ×4 IMPLANT
CUFF TOURNIQUET SINGLE 34IN LL (TOURNIQUET CUFF) IMPLANT
CUFF TOURNIQUET SINGLE 44IN (TOURNIQUET CUFF) IMPLANT
CWS 400CLOSED WOUND SUCTION KIT ×2 IMPLANT
DRAPE ARTHROSCOPY W/POUCH 114 (DRAPES) ×2 IMPLANT
DRAPE U-SHAPE 47X51 STRL (DRAPES) ×2 IMPLANT
DRSG EMULSION OIL 3X3 NADH (GAUZE/BANDAGES/DRESSINGS) ×2 IMPLANT
DRSG PAD ABDOMINAL 8X10 ST (GAUZE/BANDAGES/DRESSINGS) ×2 IMPLANT
GAUZE SPONGE 4X4 16PLY XRAY LF (GAUZE/BANDAGES/DRESSINGS) ×2 IMPLANT
GLOVE BIO SURGEON STRL SZ7.5 (GLOVE) ×2 IMPLANT
GLOVE BIO SURGEON STRL SZ8 (GLOVE) ×2 IMPLANT
GLOVE BIOGEL PI IND STRL 7.5 (GLOVE) ×1 IMPLANT
GLOVE BIOGEL PI IND STRL 8 (GLOVE) ×1 IMPLANT
GLOVE BIOGEL PI INDICATOR 7.5 (GLOVE) ×1
GLOVE BIOGEL PI INDICATOR 8 (GLOVE) ×1
GOWN PREVENTION PLUS XLARGE (GOWN DISPOSABLE) ×2 IMPLANT
GOWN STRL NON-REIN LRG LVL3 (GOWN DISPOSABLE) ×4 IMPLANT
KIT BASIN OR (CUSTOM PROCEDURE TRAY) ×2 IMPLANT
KIT ROOM TURNOVER OR (KITS) ×2 IMPLANT
MANIFOLD NEPTUNE II (INSTRUMENTS) ×2 IMPLANT
PACK ARTHROSCOPY DSU (CUSTOM PROCEDURE TRAY) ×2 IMPLANT
PAD ARMBOARD 7.5X6 YLW CONV (MISCELLANEOUS) ×4 IMPLANT
PADDING CAST COTTON 6X4 STRL (CAST SUPPLIES) ×4 IMPLANT
SET ARTHROSCOPY TUBING (MISCELLANEOUS) ×1
SET ARTHROSCOPY TUBING LN (MISCELLANEOUS) ×1 IMPLANT
SPONGE GAUZE 4X4 12PLY (GAUZE/BANDAGES/DRESSINGS) ×2 IMPLANT
SUT ETHILON 4 0 PS 2 18 (SUTURE) ×2 IMPLANT
TOWEL OR 17X24 6PK STRL BLUE (TOWEL DISPOSABLE) ×4 IMPLANT
TUBE CONNECTING 12X1/4 (SUCTIONS) ×2 IMPLANT
WAND 90 DEG TURBOVAC W/CORD (SURGICAL WAND) IMPLANT
WATER STERILE IRR 1000ML POUR (IV SOLUTION) ×2 IMPLANT

## 2012-12-09 NOTE — ED Notes (Addendum)
Attempted lab draw x 2, but unsuccessful. RN, Topher made aware. Main lab notified and phlebotomy do not draw labs after 3 am.

## 2012-12-09 NOTE — ED Notes (Signed)
Attempted to call report to floor, RN is busy starting blood transfusion and they  will call back as soon as they can.

## 2012-12-09 NOTE — Progress Notes (Signed)
ANTIBIOTIC CONSULT NOTE - FOLLOW UP  Pharmacy Consult for [facility] Indication: Septic knee joint  No Known Allergies  Patient Measurements: Height: 5\' 5"  (165.1 cm) Weight: 316 lb 9.3 oz (143.6 kg) IBW/kg (Calculated) : 57 Adjusted Body Weight: 90 kg  Vital Signs: Temp: 98.3 F (36.8 C) (10/25 2020) Temp src: Oral (10/25 1136) BP: 192/63 mmHg (10/25 2020) Pulse Rate: 110 (10/25 2020) Intake/Output from previous day:   Intake/Output from this shift: Total I/O In: -  Out: 400 [Urine:400]  Labs:  Recent Labs  12/09/12 0415  WBC 17.1*  HGB 13.6  PLT 290  CREATININE 0.72   Estimated Creatinine Clearance: 106.8 ml/min (by C-G formula based on Cr of 0.72).  Recent Labs  12/09/12 2000  GENTRANDOM 2.0     Microbiology: Recent Results (from the past 720 hour(s))  BODY FLUID CULTURE     Status: None   Collection Time    12/09/12  5:56 AM      Result Value Range Status   Specimen Description KNEE   Final   Special Requests Normal   Final   Gram Stain     Final   Value: CYTOSPIN WBC PRESENT, PREDOMINANTLY PMN     NO ORGANISMS SEEN     Performed by [facility]     Performed at [facility]   Culture PENDING   Incomplete   Report Status PENDING   Incomplete  GRAM STAIN     Status: None   Collection Time    12/09/12  5:56 AM      Result Value Range Status   Specimen Description KNEE   Final   Special Requests Normal   Final   Gram Stain     Final   Value: CYTOSPIN     WBC PRESENT, PREDOMINANTLY PMN     NO ORGANISMS SEEN     Gram Stain Report Called to,Read Back By and Verified With: T.SMITH RN AT 0820 ON [id number] BY C.BONGEL   Report Status 12/09/2012 FINAL   Final  MRSA PCR SCREENING     Status: None   Collection Time    12/09/12  1:37 PM      Result Value Range Status   MRSA by PCR NEGATIVE  NEGATIVE Final   Comment:            The GeneXpert MRSA Assay (FDA     approved for NASAL specimens     only), is one component of a     comprehensive  MRSA colonization     surveillance program. It is not     intended to diagnose MRSA     infection nor to guide or     monitor treatment for     MRSA infections.  GRAM STAIN     Status: None   Collection Time    12/09/12  5:46 PM      Result Value Range Status   Specimen Description FLUID SYNOVIAL RIGHT KNEE   Final   Special Requests PATIENT ON FOLLOWING ANCEF FLUID ON SWAB   Final   Gram Stain     Final   Value: FEW WBC PRESENT,BOTH PMN AND MONONUCLEAR     NO ORGANISMS SEEN     Gram Stain Report Called to,Read Back By and Verified With:     HANDY M,MD 1835 12/09/12 SCALES H   Report Status 12/09/2012 FINAL   Final    Anti-infectives   Start     Dose/Rate Route Frequency Ordered Stop  12/10/12 1000  gentamicin (GARAMYCIN) 600 mg in dextrose 5 % 100 mL IVPB     600 mg 115 mL/hr over 60 Minutes Intravenous Every 24 hours 12/09/12 0930     12/10/12 0100  vancomycin (VANCOCIN) 1,500 mg in sodium chloride 0.9 % 500 mL IVPB     1,500 mg 250 mL/hr over 120 Minutes Intravenous Every 12 hours 12/09/12 0930     12/09/12 1655  ceFAZolin (ANCEF) 2-3 GM-% IVPB SOLR    Comments:  Zachery Conch, Scot   : cabinet override      12/09/12 1655 12/10/12 0459   12/09/12 1653  ceFAZolin (ANCEF) 1-5 GM-% IVPB    Comments:  Alanda Amass   : cabinet override      12/09/12 1653 12/10/12 0459   12/09/12 0930  gentamicin (GARAMYCIN) 600 mg in dextrose 5 % 100 mL IVPB     600 mg 115 mL/hr over 60 Minutes Intravenous STAT 12/09/12 0917 12/09/12 1132   12/09/12 0930  vancomycin (VANCOCIN) 2,500 mg in sodium chloride 0.9 % 500 mL IVPB     2,500 mg 250 mL/hr over 120 Minutes Intravenous STAT 12/09/12 0917 12/09/12 1410      Assessment: 62 yo female s/p bilateral knee replacements presents to ED on 10/25 with worsening right leg pain. Aspiration of the knee showed abnormal synovial fluid with elevated WBC and neutrophils, cultures pending. Beginning broad spectrum antibiotics with vancomycin and  gentamicin.  Gentamycin level done approx 9.5hr post-dose=2 in goal range for q24h dosing per the [place] [employer]  Plan:  Natasha Bence 600mg  IV q24h  Misty Stanley Stillinger 12/09/2012,10:01 PM

## 2012-12-09 NOTE — H&P (Signed)
I have seen and examined the patient. I agree with the findings above.  Symptoms began on Wednesday with progression from slight rash of right lower leg to warmth, swelling of right knee.  As Mr. Renae Fickle also notes, no other symptoms or complaints of tooth issues or other wounds, but today UA positive for UTI.  I discussed with the patient the risks and benefits of surgery for her right knee infection, including the possibility of failure to resolve infection, nerve injury, vessel injury, wound breakdown, DVT/ PE, loss of motion, and need for further surgery among others, including possible poly exchange or TKA revision.  She understands these risks and wishes to proceed.   Budd Palmer, MD 12/09/2012 4:54 PM

## 2012-12-09 NOTE — Brief Op Note (Signed)
12/09/2012  6:40 PM  PATIENT:  Derryl Harbor  62 y.o. female  PRE-OPERATIVE DIAGNOSIS:  Right Total Knee Infection  POST-OPERATIVE DIAGNOSIS:  Right total knee infection, right medial condyle plica  PROCEDURE:  Procedure(s): ARTHROSCOPIC IRRIGATION AND DEBRIDEMENT RIGHT KNEE (Right) 2. Plica excision right medial femoral condyle  SURGEON:  Surgeon(s) and Role:    * Budd Palmer, MD - Primary  ANESTHESIA:   general  EBL:  Total I/O In: 850 [I.V.:600; IV Piggyback:250] Out: 465 [Urine:450; Blood:15]  BLOOD ADMINISTERED:none  DRAINS: Penrose drain in the R TKA   LOCAL MEDICATIONS USED:  MARCAINE     SPECIMEN:  Source of Specimen:  knee joint  DISPOSITION OF SPECIMEN:  micro  COUNTS:  YES  TOURNIQUET:  * No tourniquets in log *  DICTATION: .Other Dictation: Dictation Number [id number]  PLAN OF CARE: Admit to inpatient   PATIENT DISPOSITION:  PACU - hemodynamically stable.   Delay start of Pharmacological VTE agent (>24hrs) due to surgical blood loss or risk of bleeding: no

## 2012-12-09 NOTE — Anesthesia Preprocedure Evaluation (Addendum)
Anesthesia Evaluation  Patient identified by MRN, date of birth, ID band Patient awake    Reviewed: Allergy & Precautions, H&P , NPO status , Patient's Chart, lab work & pertinent test results, reviewed documented beta blocker date and time   Airway Mallampati: II TM Distance: >3 FB Neck ROM: Full    Dental  (+) Teeth Intact and Dental Advisory Given   Pulmonary neg pulmonary ROS,          Cardiovascular hypertension, Pt. on medications and Pt. on home beta blockers Rhythm:Regular Rate:Normal     Neuro/Psych    GI/Hepatic Neg liver ROS,   Endo/Other  negative endocrine ROS  Renal/GU negative Renal ROS     Musculoskeletal   Abdominal   Peds  Hematology   Anesthesia Other Findings   Reproductive/Obstetrics                         Anesthesia Physical Anesthesia Plan  ASA: III  Anesthesia Plan: General   Post-op Pain Management:    Induction: Intravenous  Airway Management Planned: Oral ETT  Additional Equipment:   Intra-op Plan:   Post-operative Plan: Extubation in OR  Informed Consent: I have reviewed the patients History and Physical, chart, labs and discussed the procedure including the risks, benefits and alternatives for the proposed anesthesia with the patient or authorized representative who has indicated his/her understanding and acceptance.   Dental advisory given  Plan Discussed with: CRNA, Anesthesiologist and Surgeon  Anesthesia Plan Comments:         Anesthesia Quick Evaluation

## 2012-12-09 NOTE — Anesthesia Postprocedure Evaluation (Signed)
  Anesthesia Post-op Note  Patient: [name] [name]  Procedure(s) Performed: Procedure(s): ARTHROSCOPIC IRRIGATION AND DEBRIDEMENT RIGHT KNEE (Right)  Patient Location: PACU  Anesthesia Type:General  Level of Consciousness: awake  Airway and Oxygen Therapy: Patient Spontanous Breathing  Post-op Pain: mild  Post-op Assessment: Post-op Vital signs reviewed  Post-op Vital Signs: Reviewed  Complications: No apparent anesthesia complications

## 2012-12-09 NOTE — OR Nursing (Addendum)
Foley in upon arrival to OR.  Urine color - concentrated.

## 2012-12-09 NOTE — Preoperative (Signed)
Beta Blockers   Reason not to administer Beta Blockers:No beta blocker in past 24hr.  Will administer intraop

## 2012-12-09 NOTE — Transfer of Care (Signed)
Immediate Anesthesia Transfer of Care Note  Patient: [name] [name]  Procedure(s) Performed: Procedure(s): ARTHROSCOPIC IRRIGATION AND DEBRIDEMENT RIGHT KNEE (Right)  Patient Location: PACU  Anesthesia Type:General  Level of Consciousness: sedated  Airway & Oxygen Therapy: Patient Spontanous Breathing and Patient connected to nasal cannula oxygen  Post-op Assessment: Report given to PACU RN and Post -op Vital signs reviewed and stable  Post vital signs: Reviewed and stable  Complications: No apparent anesthesia complications

## 2012-12-09 NOTE — Anesthesia Procedure Notes (Signed)
Procedure Name: Intubation Date/Time: 12/09/2012 5:19 PM Performed by: Alanda Amass A Pre-anesthesia Checklist: Patient identified, Timeout performed, Emergency Drugs available, Suction available and Patient being monitored Patient Re-evaluated:Patient Re-evaluated prior to inductionOxygen Delivery Method: Circle system utilized Preoxygenation: Pre-oxygenation with 100% oxygen Intubation Type: IV induction, Rapid sequence and Cricoid Pressure applied Laryngoscope Size: Mac and 3 Grade View: Grade I Tube type: Oral Tube size: 7.5 mm Number of attempts: 1 Airway Equipment and Method: Stylet Placement Confirmation: ETT inserted through vocal cords under direct vision,  breath sounds checked- equal and bilateral and positive ETCO2 Secured at: 22 cm Tube secured with: Tape Dental Injury: Teeth and Oropharynx as per pre-operative assessment

## 2012-12-09 NOTE — ED Provider Notes (Signed)
CSN: [id number]     Arrival date & time 12/09/12  0141 History   First MD Initiated Contact with Patient 12/09/12 0157     Chief Complaint  Patient presents with  . Leg Pain   (Consider location/radiation/quality/duration/timing/severity/associated sxs/prior Treatment) HPI 62 yo female presents to the ER from home with complaint of right knee pain.  Pt s/p bilateral knee replacements, right one done 6/12 with Dr Eulah Pont.  No trauma to the area.  Pain started abruptly on Wednesday and has been worsening since that time.  No fevers, no recent infections, no boils, no skin changes.  Tonight pain worsened, causing to her to call 911.   Past Medical History  Diagnosis Date  . Hyperlipidemia   . Hypertension    Past Surgical History  Procedure Laterality Date  . Joint replacement    . Knee surgery Bilateral    No family history on file. History  Substance Use Topics  . Smoking status: Former Smoker    Quit date: 08/01/1992  . Smokeless tobacco: Never Used  . Alcohol Use: No   OB History   Grav Para Term Preterm Abortions TAB SAB Ect Mult Living                 Review of Systems  Respiratory: Positive for shortness of breath (on exertion).   Cardiovascular: Positive for leg swelling (since having knee replacements).  All other systems reviewed and are negative.    Allergies  Review of patient's allergies indicates no known allergies.  Home Medications   Current Outpatient Rx  Name  Route  Sig  Dispense  Refill  . amLODipine-valsartan (EXFORGE) 10-160 MG per tablet   Oral   Take 1 tablet by mouth daily.         [name] Kitchen aspirin 81 MG tablet   Oral   Take 81 mg by mouth daily.         . Aspirin-Caffeine 845-65 MG PACK   Oral   Take 1 Package by mouth every 8 (eight) hours as needed (headache).         [name] Kitchen atorvastatin (LIPITOR) 40 MG tablet   Oral   Take 40 mg by mouth daily.         . cyclobenzaprine (FLEXERIL) 10 MG tablet   Oral   Take 1 tablet (10 mg total)  by mouth 3 (three) times daily as needed for muscle spasms.   30 tablet   0   . HYDROcodone-acetaminophen (NORCO/VICODIN) 5-325 MG per tablet   Oral   Take 1 tablet by mouth every 4 (four) hours as needed for pain.   10 tablet   0   . nebivolol (BYSTOLIC) 10 MG tablet   Oral   Take 10 mg by mouth daily.         . predniSONE (DELTASONE) 20 MG tablet   Oral   Take 2 tablets (40 mg total) by mouth daily. For 5 days   10 tablet   0   . traMADol (ULTRAM) 50 MG tablet   Oral   Take 50 mg by mouth every 6 (six) hours as needed for pain (pain).          BP 150/92  Pulse 114  Temp(Src) 99.2 F (37.3 C) (Oral)  Resp 20  SpO2 98% Physical Exam  Nursing note and vitals reviewed. Constitutional: She is oriented to person, place, and time. She appears well-developed and well-nourished. She appears distressed.  HENT:  Head: Normocephalic and atraumatic.  Nose:  Nose normal.  Mouth/Throat: Oropharynx is clear and moist.  Eyes: Conjunctivae and EOM are normal. Pupils are equal, round, and reactive to light.  Neck: Normal range of motion. Neck supple. No JVD present. No tracheal deviation present. No thyromegaly present.  Cardiovascular: Regular rhythm, normal heart sounds and intact distal pulses.  Exam reveals no gallop and no friction rub.   No murmur heard. Tachycardia noted  Pulmonary/Chest: Effort normal and breath sounds normal. No stridor. No respiratory distress. She has no wheezes. She has no rales. She exhibits no tenderness.  Abdominal: Soft. Bowel sounds are normal. She exhibits no distension and no mass. There is no tenderness. There is no rebound and no guarding.  Musculoskeletal: She exhibits edema (2+bilaterally) and tenderness.  Decreased ROM of right knee, warmth, effusion noted.  Pain worse over medial aspect.  No calf swelling or warmth.  No overlying skin changes  Lymphadenopathy:    She has no cervical adenopathy.  Neurological: She is alert and oriented to  person, place, and time. She exhibits normal muscle tone. Coordination normal.  Skin: Skin is warm and dry. Rash (diffuse nonblanching maculpapular rash to right lowe extremity) noted. No erythema. No pallor.  Psychiatric: She has a normal mood and affect. Her behavior is normal. Judgment and thought content normal.    ED Course  ARTHOCENTESIS Date/Time: 12/09/2012 5:55 AM Performed by: Olivia Mackie Authorized by: Olivia Mackie Consent: Verbal consent obtained. Risks and benefits: risks, benefits and alternatives were discussed Consent given by: patient Patient understanding: patient states understanding of the procedure being performed Patient consent: the patient's understanding of the procedure matches consent given Test results: test results available and properly labeled Site marked: the operative site was marked Imaging studies: imaging studies available Required items: required blood products, implants, devices, and special equipment available Patient identity confirmed: verbally with patient and arm band Time out: Immediately prior to procedure a "time out" was called to verify the correct patient, procedure, equipment, support staff and site/side marked as required. Indications: pain, possible septic joint and joint swelling  Body area: knee Joint: right knee Local anesthesia used: yes Anesthesia: local infiltration Local anesthetic: lidocaine 1% without epinephrine Anesthetic total: 5 ml Patient sedated: no Preparation: Patient was prepped and draped in the usual sterile fashion. Needle gauge: 18 G Approach: lateral Aspirate: cloudy, yellow and purulent Aspirate amount: 37 ml Patient tolerance: Patient tolerated the procedure well with no immediate complications. Comments: Fluid send for cell count, gram stain, crystal evaluation, glucose   (including critical care time) Labs Review Labs Reviewed  CBC WITH DIFFERENTIAL - Abnormal; Notable for the following:    WBC  17.1 (*)    Neutrophils Relative % 81 (*)    Neutro Abs 13.9 (*)    Lymphocytes Relative 7 (*)    Monocytes Absolute 2.0 (*)    All other components within normal limits  BASIC METABOLIC PANEL - Abnormal; Notable for the following:    Glucose, Bld 163 (*)    All other components within normal limits  SEDIMENTATION RATE - Abnormal; Notable for the following:    Sed Rate 26 (*)    All other components within normal limits  CULTURE, BLOOD (ROUTINE X 2)  CULTURE, BLOOD (ROUTINE X 2)  BODY FLUID CULTURE  GRAM STAIN  C-REACTIVE PROTEIN  SYNOVIAL FLUID, CRYSTAL  SYNOVIAL CELL COUNT + DIFF, W/ CRYSTALS  GLUCOSE, SYNOVIAL FLUID  GLUCOSE, CAPILLARY  CG4 I-STAT (LACTIC ACID)   Imaging Review Dg Chest 2 View  12/09/2012   CLINICAL DATA:  Medical clearance; history of smoking.  EXAM: CHEST  2 VIEW  COMPARISON:  Chest radiograph performed 07/21/2010  FINDINGS: The heart size and mediastinal contours are within normal limits. Both lungs are clear. Pulmonary vascularity is at the upper limits of normal. The visualized skeletal structures are unremarkable.  IMPRESSION: No active cardiopulmonary disease.   Electronically Signed   By: Roanna Raider M.D.   On: 12/09/2012 03:07   Dg Knee 2 Views Right  12/09/2012   CLINICAL DATA:  Right knee pain for 3 days.  EXAM: RIGHT KNEE - 1-2 VIEW  COMPARISON:  Right knee radiographs performed 07/29/2010  FINDINGS: There is no evidence of fracture or dislocation. The patient is status post total knee arthroplasty; the arthroplasty appears grossly intact, with minimal associated degenerative change.  A small knee joint effusion is suspected. The visualized soft tissues are otherwise unremarkable in appearance.  IMPRESSION: 1. No evidence of fracture or dislocation; right total knee arthroplasty appears intact. 2. Suspect small knee joint effusion.   Electronically Signed   By: Roanna Raider M.D.   On: 12/09/2012 02:58    EKG Interpretation   None       MDM   No diagnosis found. 62 yo female with right knee pain, warmth, swelling, decreased ROM.  Concern for possible septic joint.  Will get xray, labs.  Will d/w Theodis Shove on call physician, expect will need tap of the joint.  D/w Dr Carola Frost with orthopedics.  He recommends joint aspiration, fluid studies.  Pt updated on findings and plan, agrees to procedure.   Care passed to Dr Effie Shy awaiting results of arthrocentesis fluid  Olivia Mackie, MD 12/10/12 [phone]

## 2012-12-09 NOTE — H&P (Signed)
Orthopaedic Trauma Service H&P   Chief Complaint:  R knee pain/septic R TKA HPI:   The patient Is a very pleasant 62 year old black female who presents to the hospital with complaints of right knee pain x1 week. Patient had called her orthopedic office 2 times a week indicating that she was having some increased right knee pain. Last night the pain got so bad that she was unable to move her right leg and she felt that it was necessary to be seen. She presented to [facility]. Arthrocentesis of the knee was performed which demonstrated an abundance of white blood cells in the synovial fluid. Patient is transferred to [facility] for surgery later on this afternoon. The patient is in 5 N. 28. she complains only of right knee pain. She denies any recent illnesses, no recent dental work. She is unsure how this precipitated. Patient denies any fevers or chills. She does not feel ill  Patient lives with her daughter and grandson  She was recently treated with prednisone for cervical radiculopathy. This was prescribed to her on 12/01/2012. She completed her course and her neck is feeling much better   Past Medical History  Diagnosis Date  . Hyperlipidemia   . Hypertension     Past Surgical History  Procedure Laterality Date  . Joint replacement    . Knee surgery Bilateral     No family history on file. Social History:  reports that she quit smoking about 20 years ago. She has never used smokeless tobacco. She reports that she does not drink alcohol or use illicit drugs.  Allergies: No Known Allergies  Medications Prior to Admission  Medication Sig Dispense Refill  . amLODipine-valsartan (EXFORGE) 10-160 MG per tablet Take 1 tablet by mouth daily.      [name] Kitchen aspirin 81 MG tablet Take 81 mg by mouth daily.      . Aspirin-Caffeine 845-65 MG PACK Take 1 Package by mouth every 8 (eight) hours as needed (headache).      [name] Kitchen atorvastatin (LIPITOR) 40 MG tablet Take 40 mg by mouth  daily.      . cyclobenzaprine (FLEXERIL) 10 MG tablet Take 1 tablet (10 mg total) by mouth 3 (three) times daily as needed for muscle spasms.  30 tablet  0  . HYDROcodone-acetaminophen (NORCO/VICODIN) 5-325 MG per tablet Take 1 tablet by mouth every 4 (four) hours as needed for pain.  10 tablet  0  . nebivolol (BYSTOLIC) 10 MG tablet Take 10 mg by mouth daily.      . predniSONE (DELTASONE) 20 MG tablet Take 2 tablets (40 mg total) by mouth daily. For 5 days  10 tablet  0  . traMADol (ULTRAM) 50 MG tablet Take 50 mg by mouth every 6 (six) hours as needed for pain (pain).        Results for orders placed during the hospital encounter of 12/09/12 (from the past 48 hour(s))  CBC WITH DIFFERENTIAL     Status: Abnormal   Collection Time    12/09/12  4:15 AM      Result Value Range   WBC 17.1 (*) 4.0 - 10.5 K/uL   RBC 4.87  3.87 - 5.11 MIL/uL   Hemoglobin 13.6  12.0 - 15.0 g/dL   HCT 16.1  09.6 - 04.5 %   MCV 85.2  78.0 - 100.0 fL   MCH 27.9  26.0 - 34.0 pg   MCHC 32.8  30.0 - 36.0 g/dL   RDW 40.9  81.1 -  15.5 %   Platelets 290  150 - 400 K/uL   Neutrophils Relative % 81 (*) 43 - 77 %   Neutro Abs 13.9 (*) 1.7 - 7.7 K/uL   Lymphocytes Relative 7 (*) 12 - 46 %   Lymphs Abs 1.2  0.7 - 4.0 K/uL   Monocytes Relative 11  3 - 12 %   Monocytes Absolute 2.0 (*) 0.1 - 1.0 K/uL   Eosinophils Relative 0  0 - 5 %   Eosinophils Absolute 0.0  0.0 - 0.7 K/uL   Basophils Relative 0  0 - 1 %   Basophils Absolute 0.0  0.0 - 0.1 K/uL  BASIC METABOLIC PANEL     Status: Abnormal   Collection Time    12/09/12  4:15 AM      Result Value Range   Sodium 135  135 - 145 mEq/L   Potassium 4.1  3.5 - 5.1 mEq/L   Chloride 99  96 - 112 mEq/L   CO2 25  19 - 32 mEq/L   Glucose, Bld 163 (*) 70 - 99 mg/dL   BUN 15  6 - 23 mg/dL   Creatinine, Ser 1.61  0.50 - 1.10 mg/dL   Calcium 9.5  8.4 - 09.6 mg/dL   GFR calc non Af Amer >90  >90 mL/min   GFR calc Af Amer >90  >90 mL/min   Comment: (NOTE)     The eGFR has  been calculated using the CKD EPI equation.     This calculation has not been validated in all clinical situations.     eGFR's persistently <90 mL/min signify possible Chronic Kidney     Disease.  SEDIMENTATION RATE     Status: Abnormal   Collection Time    12/09/12  4:15 AM      Result Value Range   Sed Rate 26 (*) 0 - 22 mm/hr  C-REACTIVE PROTEIN     Status: Abnormal   Collection Time    12/09/12  4:15 AM      Result Value Range   CRP 6.1 (*) <0.60 mg/dL   Comment: Performed at [facility]  CG4 I-STAT (LACTIC ACID)     Status: None   Collection Time    12/09/12  4:30 AM      Result Value Range   Lactic Acid, Venous 1.87  0.5 - 2.2 mmol/L  GRAM STAIN     Status: None   Collection Time    12/09/12  5:56 AM      Result Value Range   Specimen Description KNEE     Special Requests Normal     Gram Stain       Value: CYTOSPIN     WBC PRESENT, PREDOMINANTLY PMN     NO ORGANISMS SEEN     Gram Stain Report Called to,Read Back By and Verified With: T.SMITH RN AT 0820 ON 25OCT14 BY C.BONGEL   Report Status 12/09/2012 FINAL    SYNOVIAL CELL COUNT + DIFF, W/ CRYSTALS     Status: Abnormal   Collection Time    12/09/12  5:56 AM      Result Value Range   Color, Synovial STRAW (*) YELLOW   Appearance-Synovial TURBID (*) CLEAR   Crystals, Fluid NO CRYSTALS SEEN     WBC, Synovial 04540 (*) 0 - 200 /cu mm   Neutrophil, Synovial 98 (*) 0 - 25 %   Lymphocytes-Synovial Fld 2  0 - 20 %  GLUCOSE, SYNOVIAL FLUID  Status: None   Collection Time    12/09/12  5:56 AM      Result Value Range   Glucose, Synovial Fluid 44     Comment:            THE NORMAL FLUID LEVEL IS USUALLY     EQUAL TO OR SLIGHTLY LESS THAN     THE NORMAL SERUM LEVEL (WITHIN     10 mg/dL).     PERFORMED AT [facility]     Performed at [facility]  GLUCOSE, CAPILLARY     Status: Abnormal   Collection Time    12/09/12  5:57 AM      Result Value Range   Glucose-Capillary 155 (*) 70 - 99 mg/dL    Comment 1 Notify RN    URINALYSIS, ROUTINE W REFLEX MICROSCOPIC     Status: Abnormal   Collection Time    12/09/12 12:03 PM      Result Value Range   Color, Urine YELLOW  YELLOW   APPearance CLOUDY (*) CLEAR   Specific Gravity, Urine 1.037 (*) 1.005 - 1.030   pH 5.5  5.0 - 8.0   Glucose, UA NEGATIVE  NEGATIVE mg/dL   Hgb urine dipstick SMALL (*) NEGATIVE   Bilirubin Urine NEGATIVE  NEGATIVE   Ketones, ur NEGATIVE  NEGATIVE mg/dL   Protein, ur 30 (*) NEGATIVE mg/dL   Urobilinogen, UA 1.0  0.0 - 1.0 mg/dL   Nitrite POSITIVE (*) NEGATIVE   Leukocytes, UA MODERATE (*) NEGATIVE  URINE MICROSCOPIC-ADD ON     Status: Abnormal   Collection Time    12/09/12 12:03 PM      Result Value Range   Squamous Epithelial / LPF RARE  RARE   WBC, UA 11-20  <3 WBC/hpf   RBC / HPF 3-6  <3 RBC/hpf   Bacteria, UA MANY (*) RARE   Casts HYALINE CASTS (*) NEGATIVE  GLUCOSE, CAPILLARY     Status: Abnormal   Collection Time    12/09/12  1:42 PM      Result Value Range   Glucose-Capillary 109 (*) 70 - 99 mg/dL   Dg Chest 2 View  82/95/6213   CLINICAL DATA:  Medical clearance; history of smoking.  EXAM: CHEST  2 VIEW  COMPARISON:  Chest radiograph performed 07/21/2010  FINDINGS: The heart size and mediastinal contours are within normal limits. Both lungs are clear. Pulmonary vascularity is at the upper limits of normal. The visualized skeletal structures are unremarkable.  IMPRESSION: No active cardiopulmonary disease.   Electronically Signed   By: Roanna Raider M.D.   On: 12/09/2012 03:07   Dg Knee 2 Views Right  12/09/2012   CLINICAL DATA:  Right knee pain for 3 days.  EXAM: RIGHT KNEE - 1-2 VIEW  COMPARISON:  Right knee radiographs performed 07/29/2010  FINDINGS: There is no evidence of fracture or dislocation. The patient is status post total knee arthroplasty; the arthroplasty appears grossly intact, with minimal associated degenerative change.  A small knee joint effusion is suspected. The visualized  soft tissues are otherwise unremarkable in appearance.  IMPRESSION: 1. No evidence of fracture or dislocation; right total knee arthroplasty appears intact. 2. Suspect small knee joint effusion.   Electronically Signed   By: Roanna Raider M.D.   On: 12/09/2012 02:58    Review of Systems  Constitutional: Negative for fever, chills, weight loss (weight gain ) and diaphoresis.  HENT: Negative for sore throat.   Eyes: Negative for blurred vision.  Respiratory: Negative for cough, shortness of breath and wheezing.   Cardiovascular: Negative for chest pain and palpitations.  Gastrointestinal: Negative for nausea, vomiting and abdominal pain.  Genitourinary: Positive for frequency. Negative for dysuria.  Musculoskeletal:       R knee pain x 1 week   Neurological: Negative for tingling, sensory change and headaches.    Blood pressure 153/50, pulse 111, temperature 98.3 F (36.8 C), temperature source Oral, resp. rate 18, height 5\' 5"  (1.651 m), weight 143.6 kg (316 lb 9.3 oz), SpO2 97.00%. Physical Exam  Constitutional: Vital signs are normal. She appears well-developed and well-nourished. She is cooperative.  Non-toxic appearance. She does not have a sickly appearance. She does not appear ill. No distress.  Extremely pleasant female, easy to talk to. Good historian   HENT:  Head: Normocephalic and atraumatic.  Mouth/Throat: Oropharynx is clear and moist.  Eyes: Conjunctivae and EOM are normal. Pupils are equal, round, and reactive to light.  Neck: Normal range of motion and full passive range of motion without pain. Neck supple. No spinous process tenderness and no muscular tenderness present. Normal range of motion present.  Cardiovascular: Normal rate, regular rhythm, S1 normal and S2 normal.   Respiratory:  Clear BS B   GI:  Obese, soft, NTND  Genitourinary:  Foley   Musculoskeletal:  Right Lower Extremity       Right hip is unremarkable      Right knee with moderate effusion       Healed midline total knee and noted      Mild erythema      Pain with motion of R knee      Symmetric swelling to B LEx      Ankle and foot unremarkable      DPN, SPN, TN sensation intact      EHL, FHL, AT, PT, peroneals, gastroc motor intact      + DP pulse      Compartments soft and NT          Neurological: She is alert.  Skin: Skin is warm.    Xrays R knee      No loosening or fx around prothesis      Mild joint effusion   Assessment/Plan  62 y/o female with R knee pain, septic arthritis R TKA  1. Septic R TKA  OR today for I&D  Pt started on vanc and gent- fluid cx pending. This was obtained in ED  Suspect UTI and prednisone use contributed to development of septic knee      2. Medical issues  Continue with home meds  3. UTI  Pt currently on Vanc and gent  Will await cx   4. FEN  NPO  IVF  5. Other  Pt has noted inc urinary frequency and nocturia (could be due to #3) as well as wt gain-  Will check HgbA1c   Will need to follow up with pcp regarding this   Pt states that she is trying to lose weight and is eating "right"- can have RD see to eval pt and give recs  6. ID  vanc and gent  Pt will likely need picc  7. Dispo  OR this pm for I&D      Mearl Latin, PA-C [facility] [phone] (P) 12/09/2012, 1:52 PM

## 2012-12-09 NOTE — ED Notes (Signed)
Bed: WJ19 Expected date:  Expected time:  Means of arrival:  Comments: Bed 14, EMS, 62 F, Knee Pain

## 2012-12-09 NOTE — ED Provider Notes (Signed)
[name] [name] is a 62 y.o. female presents for evaluation of worsening leg pain for several days. The pain is right-sided located in right posterior thigh, and right knee. She was unable to walk this morning. She has had intermittent pain in both knees since she has had knee replacements. She has had nausea without vomiting. Her pain is improved after treatment in the emergency department. I evaluated her at 0845 and pain was 5/10.  Labs have returned showing abnormal synovial fluid, with a normal appearance and elevated white blood cell count. There are no cells or crystals seen on the microscopic examination. A culture is pending.   Results for orders placed during the hospital encounter of 12/09/12  GRAM STAIN      Result Value Range   Specimen Description KNEE     Special Requests Normal     Gram Stain       Value: CYTOSPIN     WBC PRESENT, PREDOMINANTLY PMN     NO ORGANISMS SEEN     Gram Stain Report Called to,Read Back By and Verified With: T.SMITH RN AT 0820 ON 25OCT14 BY C.BONGEL   Report Status 12/09/2012 FINAL    CBC WITH DIFFERENTIAL      Result Value Range   WBC 17.1 (*) 4.0 - 10.5 K/uL   RBC 4.87  3.87 - 5.11 MIL/uL   Hemoglobin 13.6  12.0 - 15.0 g/dL   HCT 40.9  81.1 - 91.4 %   MCV 85.2  78.0 - 100.0 fL   MCH 27.9  26.0 - 34.0 pg   MCHC 32.8  30.0 - 36.0 g/dL   RDW 78.2  95.6 - 21.3 %   Platelets 290  150 - 400 K/uL   Neutrophils Relative % 81 (*) 43 - 77 %   Neutro Abs 13.9 (*) 1.7 - 7.7 K/uL   Lymphocytes Relative 7 (*) 12 - 46 %   Lymphs Abs 1.2  0.7 - 4.0 K/uL   Monocytes Relative 11  3 - 12 %   Monocytes Absolute 2.0 (*) 0.1 - 1.0 K/uL   Eosinophils Relative 0  0 - 5 %   Eosinophils Absolute 0.0  0.0 - 0.7 K/uL   Basophils Relative 0  0 - 1 %   Basophils Absolute 0.0  0.0 - 0.1 K/uL  BASIC METABOLIC PANEL      Result Value Range   Sodium 135  135 - 145 mEq/L   Potassium 4.1  3.5 - 5.1 mEq/L   Chloride 99  96 - 112 mEq/L   CO2 25  19 - 32 mEq/L   Glucose, Bld 163 (*) 70 - 99 mg/dL   BUN 15  6 - 23 mg/dL   Creatinine, Ser 0.86  0.50 - 1.10 mg/dL   Calcium 9.5  8.4 - 57.8 mg/dL   GFR calc non Af Amer >90  >90 mL/min   GFR calc Af Amer >90  >90 mL/min  SEDIMENTATION RATE      Result Value Range   Sed Rate 26 (*) 0 - 22 mm/hr  SYNOVIAL CELL COUNT + DIFF, W/ CRYSTALS      Result Value Range   Color, Synovial STRAW (*) YELLOW   Appearance-Synovial TURBID (*) CLEAR   Crystals, Fluid NO CRYSTALS SEEN     WBC, Synovial 46962 (*) 0 - 200 /cu mm   Neutrophil, Synovial 98 (*) 0 - 25 %   Lymphocytes-Synovial Fld 2  0 - 20 %  GLUCOSE, CAPILLARY  Result Value Range   Glucose-Capillary 155 (*) 70 - 99 mg/dL   Comment 1 Notify RN    CG4 I-STAT (LACTIC ACID)      Result Value Range   Lactic Acid, Venous 1.87  0.5 - 2.2 mmol/L     Exam- right leg, tender, posterior thigh; and diffusely, right knee. She has severe pain with any movement of the right leg. There is also some tenderness in the right lower leg with a rash of the anterior right lower leg. The rash is characterized by flat reddened areas between 1 and 2 cm in diameter, numbering around 10. The rash blanches with pressure, there are no petechiae or vesicles.   Assessment: Evaluation is c/w septic arthritis, with implanted hardware present. She will need operative management. IV ABX ordered.   9:55 AM-Consult complete with Dr. Carola Frost. Patient case explained and discussed. He agrees to admit patient for further evaluation and treatment. Call ended at 0900    Flint Melter, MD 12/09/12 [phone]

## 2012-12-09 NOTE — Op Note (Signed)
[name] [name], [name] [name]             ACCOUNT NO.:  [id number]  MEDICAL RECORD NO.:  [id number]  LOCATION:  5N28C                        FACILITY:  [facility]  PHYSICIAN:  Doralee Albino. Carola Frost, M.D. DATE OF BIRTH:  1950-09-23  DATE OF PROCEDURE:  12/09/2012 DATE OF DISCHARGE:                              OPERATIVE REPORT   PREOPERATIVE DIAGNOSES:  Infected right total knee.  POSTOPERATIVE DIAGNOSIS: 1. Infected right total knee. 2. Medial femoral condyle plica.  PROCEDURE: 1. Arthroscopic irrigation and debridement with partial synovectomy of     the right knee for infection. 2. Plica excision, right medial femoral condyle.  SURGEON:  Myrene Galas, M.D.  ASSISTANT:  None.  ANESTHESIA:  General.  COMPLICATIONS:  None.  I/O:  600 mL of crystalloid and 250 mL colloid/UOP 450 mL.  ESTIMATED BLOOD LOSS:  20 mL.  SPECIMENS:  Two anaerobic and aerobic sent from the knee joint.  FINDING:  Polys and monos with no bacteria seen.  TOURNIQUET:  None.  DISPOSITION:  To PACU.  CONDITION:  Stable.  BRIEF SUMMARY AND INDICATION FOR PROCEDURE:  [name] [name] is a 63- year-old female, who denies any history of recent injury, cough, tooth or gum pain.  She has noted some urinary frequency.  UA was positive preoperatively for UTIs.  She did receive a steroid dose pack within last week.  She complains of increasing right knee pain since Wednesday, 3 days ago, and she finally sought further medical evaluation.  I discussed with her the risks and benefits of surgery including possibility of failure to eradicate her infection, the need for further surgery including possibly knee revision, which could be staged for primary or poly exchange.  We also discussed DVT, PE, loss of motion, need for further surgery, damage to her implants, heart attack, stroke, and other anesthetic complications among others.  The patient understood these risks and did wish to proceed with debridement.  BRIEF  SUMMARY OF PROCEDURE:  [name] [name] received 3 g of Ancef preoperatively.  She was started on vancomycin and gentamicin after obtaining cultures from a knee aspiration in the emergency department as well.  Standard prep and drape was performed in the right lower extremity.  The inferolateral portal was established and advanced into the suprapatellar pouch.  Similarly, a medial working portal was established and then a superolateral cannulae was placed to facilitate inflow and outflow.  Diagnostic arthroscopy was performed of the knee demonstrating synovitis.  No significant damage to the poly.  There was very stout medial femoral plica that was at least 1 a cm in width that stretched across the medial femoral condyle into the notch.  This was debrided with the shaver.  A 9000 mL of saline were flushed through the knee as the debridement was performed and the knee was taken through range of motion consistently throughout to facilitate evacuation of all the infected joint fluid.  The knee was then injected with approximately 10 mL of 0.25% Marcaine.  The portals were closed.  A deep drain was left through the suprapatellar pouch into the knee joint.  This was sewed in.  Sterile gently compressive dressing was applied.  The patient was awakened from anesthesia and transferred to PACU  stable in condition.  PROGNOSIS:  I discussed this case with Dr. Richardson [name], who intends to resume follow up care on Monday.  Depending upon cultures, she may she may return to the OR later this week for further surgical management. We will, however, on broad-spectrum antibiotics awaiting final culture results, and at this time appears to include of vancomycin and gentamicin.  I am also checking hemoglobin A1c and will likely obtain a Infectious Disease consultation within the next 48 hours as well.  She will be weightbearing as tolerated.  She will be on DVT prophylaxis with Lovenox.     Doralee Albino. Carola Frost,  M.D.     MHH/MEDQ  D:  12/09/2012  T:  12/09/2012  Job:  [id number]

## 2012-12-09 NOTE — Progress Notes (Signed)
ANTIBIOTIC CONSULT NOTE - INITIAL  Pharmacy Consult for Vancomycin, Gentamicin Indication: Septic knee joint  No Known Allergies  Patient Measurements: Height: 5\' 4"  (162.6 cm) Weight: 317 lb (143.79 kg) IBW/kg (Calculated) : 54.7 Adjusted Body Weight: 90kg  Vital Signs: Temp: 99.2 F (37.3 C) (10/25 0801) Temp src: Oral (10/25 0801) BP: 137/56 mmHg (10/25 0801) Pulse Rate: 118 (10/25 0801) Intake/Output from previous day:   Intake/Output from this shift:    Labs:  Recent Labs  12/09/12 0415  WBC 17.1*  HGB 13.6  PLT 290  CREATININE 0.72   Estimated Creatinine Clearance: 105.3 ml/min (by C-G formula based on Cr of 0.72). No results found for this basename: VANCOTROUGH, Leodis Binet, VANCORANDOM, GENTTROUGH, GENTPEAK, GENTRANDOM, TOBRATROUGH, TOBRAPEAK, TOBRARND, AMIKACINPEAK, AMIKACINTROU, AMIKACIN,  in the last 72 hours   Microbiology: Recent Results (from the past 720 hour(s))  GRAM STAIN     Status: None   Collection Time    12/09/12  5:56 AM      Result Value Range Status   Specimen Description KNEE   Final   Special Requests Normal   Final   Gram Stain     Final   Value: CYTOSPIN     WBC PRESENT, PREDOMINANTLY PMN     NO ORGANISMS SEEN     Gram Stain Report Called to,Read Back By and Verified With: T.SMITH RN AT 0820 ON 25OCT14 BY C.BONGEL   Report Status 12/09/2012 FINAL   Final    Medical History: Past Medical History  Diagnosis Date  . Hyperlipidemia   . Hypertension     Medications:   (Not in a hospital admission)  Assessment: 62 yo female s/p bilateral knee replacements presents to ED on 10/25 with worsening right leg pain. Aspiration of the knee showed abnormal synovial fluid with elevated WBC and neutrophils, cultures pending. Beginning broad spectrum antibiotics with vancomycin and gentamicin.  Weight 144kg, Height 64in, therefore adjusted body weight 90kg  SCr wnl 0.72, estimated CrCl 105 ml/min  WBC elevated 17.2k  Goal of Therapy:   Vancomycin trough level 15-20 mcg/ml Random gentamicin level per [place] Extended Interval Nomogram  Plan:   Vancomycin 2500mg  IV x 1, then 1500mg  IV q12h  Check trough at stead state  Gentamicin 600mg  IV q24h (~7mg /kg) - check random level 10 hours after dose given Follow up renal function & cultures  Loralee Pacas, PharmD, BCPS Pager: [phone] 12/09/2012,9:23 AM

## 2012-12-09 NOTE — ED Notes (Signed)
[facility] paged. 

## 2012-12-09 NOTE — ED Notes (Signed)
Pt presents via EMS d/t right leg pain. Right leg is warm and swollen.

## 2012-12-10 LAB — URINE CULTURE

## 2012-12-10 LAB — BASIC METABOLIC PANEL
Calcium: 8.9 mg/dL (ref 8.4–10.5)
Creatinine, Ser: 0.74 mg/dL (ref 0.50–1.10)
GFR calc non Af Amer: 90 mL/min — ABNORMAL LOW (ref >90)
Sodium: 139 mEq/L (ref 135–145)

## 2012-12-10 MED ORDER — RIFAMPIN 300 MG PO CAPS
300.0000 mg | ORAL_CAPSULE | Freq: Two times a day (BID) | ORAL | Status: DC
  Filled 2012-12-10 (×2): qty 1

## 2012-12-10 MED ORDER — VANCOMYCIN HCL 10 G IV SOLR
1500.0000 mg | Freq: Two times a day (BID) | INTRAVENOUS | Status: DC
  Administered 2012-12-11 – 2012-12-12 (×2): 1500 mg via INTRAVENOUS
  Filled 2012-12-10 (×3): qty 1500

## 2012-12-10 MED ORDER — CEFAZOLIN SODIUM-DEXTROSE 2-3 GM-% IV SOLR
2.0000 g | Freq: Three times a day (TID) | INTRAVENOUS | Status: DC
  Filled 2012-12-10 (×3): qty 50

## 2012-12-10 MED ORDER — CEFAZOLIN SODIUM-DEXTROSE 2-3 GM-% IV SOLR
2.0000 g | Freq: Three times a day (TID) | INTRAVENOUS | Status: DC
  Administered 2012-12-10 – 2012-12-15 (×13): 2 g via INTRAVENOUS
  Filled 2012-12-10 (×18): qty 50

## 2012-12-10 NOTE — Progress Notes (Signed)
Information from lab: Anaerobic:positive culture, gram stain positive with clusters.  Final results still pending.

## 2012-12-10 NOTE — Progress Notes (Signed)
Clinical Social Work Department BRIEF PSYCHOSOCIAL ASSESSMENT 12/10/2012  Patient:  [name] [name], [name] [name]     Account Number:  [id number]     Admit date:  12/09/2012  Clinical Social Worker:  Hendricks Milo  Date/Time:  12/10/2012 04:18 PM  Referred by:  Physician  Date Referred:  12/10/2012 Referred for  SNF Placement   Other Referral:   Interview type:  Patient Other interview type:    PSYCHOSOCIAL DATA Living Status:  WITH ADULT CHILDREN Admitted from facility:   Level of care:   Primary support name:  Terrial Rhodes Primary support relationship to patient:  CHILD, ADULT Degree of support available:   Patient recently moved in with daughter. Daughter is very supportive.    CURRENT CONCERNS  Other Concerns:    SOCIAL WORK ASSESSMENT / PLAN Clinical Social Worker (CSW) met with patient to discuss SNF placement. Patient agreeable to SNF search in [facility] excluding [facility]. Patient's first choice is [facility]. Patient reported that she has pre-registered at [facility].   Assessment/plan status:  Psychosocial Support/Ongoing Assessment of Needs Other assessment/ plan:   Information/referral to community resources:   CSW gave patient SNF list.    PATIENT'S/FAMILY'S RESPONSE TO PLAN OF CARE: Patient appreciative of CSW visit.

## 2012-12-10 NOTE — Progress Notes (Signed)
   CARE MANAGEMENT NOTE 12/10/2012  Patient:  [name] [name], [name] [name]   Account Number:  [id number]  Date Initiated:  12/10/2012  Documentation initiated by:  [facility]  Subjective/Objective Assessment:   adm: ARTHROSCOPIC IRRIGATION AND DEBRIDEMENT RIGHT KNEE (Right)     Action/Plan:   discharge planning   Anticipated DC Date:  12/12/2012   Anticipated DC Plan:    In-house referral  Clinical Social Worker      DC Planning Services  CM consult      Choice offered to / List presented to:             Status of service:  In process, will continue to follow Medicare Important Message given?   (If response is "NO", the following Medicare IM given date fields will be blank) Date Medicare IM given:   Date Additional Medicare IM given:    Discharge Disposition:    Per UR Regulation:    If discussed at Long Length of Stay Meetings, dates discussed:    Comments:  12/10/12 14:00 CM notes PT recc. SNF.  Will continue for discharge planning.  CSW aware.  Freddy Jaksch, BSN, CM [phone].

## 2012-12-10 NOTE — Progress Notes (Signed)
Orthopedic Tech Progress Note Patient Details:  [name] [name] Jul 19, 1950 [id number]  Patient ID: [name] [name], female   DOB: 1950/04/24, 62 y.o.   MRN: [id number] Trapeze bar patient helper  Nikki Dom 12/10/2012, 1:57 PM

## 2012-12-10 NOTE — Progress Notes (Signed)
[facility] Progress Note  Subjective  Doing well this am No new issues   Blood cultures obtained in ED have a gram stain that shows Gm + cocci in clusters (both bottles) not sure what to make of this finding  Gram stain- no organisms cx pending  Review of Systems  Constitutional: Negative for fever and chills.  Eyes: Negative for blurred vision.  Respiratory: Negative for shortness of breath.   Cardiovascular: Negative for chest pain and palpitations.  Gastrointestinal: Negative for nausea, vomiting and abdominal pain.  Neurological: Negative for tingling, sensory change and headaches.    .Objective    BP 142/112  Pulse 104  Temp(Src) 99.5 F (37.5 C) (Oral)  Resp 20  Ht 5\' 5"  (1.651 m)  Wt 143.6 kg (316 lb 9.3 oz)  BMI 52.68 kg/m2  SpO2 99%  Intake/Output     10/25 0701 - 10/26 0700 10/26 0701 - 10/27 0700   I.V. (mL/kg) 1770.4 (12.3)    IV Piggyback 1365    Total Intake(mL/kg) 3135.4 (21.8)    Urine (mL/kg/hr) 1350 (0.4) 1100 (1.2)   Drains  10 (0)   Blood 15 (0)    Total Output 1365 1110   Net +1770.4 -1110          Labs Results for AYEN, VIVIANO (MRN [id number]) as of 12/10/2012 13:38  Ref. Range 12/09/2012 14:30  Hemoglobin A1C Latest Range: <5.7 % 6.6 (H)  Results for WHITTANY, PARISH (MRN [id number]) as of 12/10/2012 13:38  Ref. Range 12/10/2012 06:05  Sodium Latest Range: 135-145 mEq/L 139  Potassium Latest Range: 3.5-5.1 mEq/L 4.3  Chloride Latest Range: 96-112 mEq/L 106  CO2 Latest Range: 19-32 mEq/L 23  BUN Latest Range: 6-23 mg/dL 10  Creatinine Latest Range: 0.50-1.10 mg/dL 7.82  Calcium Latest Range: 8.4-10.5 mg/dL 8.9  GFR calc non Af Amer Latest Range: >90 mL/min 90 (L)  GFR calc Af Amer Latest Range: >90 mL/min >90  Glucose Latest Range: 70-99 mg/dL 956 (H)    Exam  Gen: sitting in chair, NAD, appears well Lungs:clear Cardiac:RRR Abd: + BS,  Ext:       Left lower extremity   Dressing c/d/i  Distal motor and  sensory functions intact  Drain patent, serosanguinous fluid present  Distal motor and sensory functions intact  Ext warm  + DP pulse     Assessment and Plan   POD/HD#: 1   62 y/o female with septic R TKA  1. Septic R TKA POD 1 arthroscopic I&D  Activity as tolerated  PT eval  Continue with hemovac drain  Dressing change tomorrow  Await cx      2. UTI  Urine cx pending  3. ID  Vanc and rifampin  Dc gent    Not sure what to make of blood culture bottles obtained at [facility] ED. Pt doesn't appear to be septic, no fever.  ? If this was contaminant   ID Consult  4. FEN  Diet as tolerated  5. DVT/PE prophylaxis  Lovenox  6. Dispo  Await cultures  Dr. Eulah Pont to determine definitive ortho course  Continue to monitor      Mearl Latin, PA-C [facility] [phone] (P) 12/10/2012 1:37 PM

## 2012-12-10 NOTE — Progress Notes (Addendum)
Clinical Social Work Department CLINICAL SOCIAL WORK PLACEMENT NOTE 12/10/2012  Patient:  [name] [name], [name] [name]  Account Number:  [id number] Admit date:  12/09/2012  Clinical Social Worker:  Jetta Lout, Theresia Majors  Date/time:  12/10/2012 04:24 PM  Clinical Social Work is seeking post-discharge placement for this patient at the following level of care:   SKILLED NURSING   (*CSW will update this form in Epic as items are completed)   12/10/2012  Patient/family provided with [facility] Department of Clinical Social Work's list of facilities offering this level of care within the geographic area requested by the patient (or if unable, by the patient's family).  12/10/2012  Patient/family informed of their freedom to choose among providers that offer the needed level of care, that participate in Medicare, Medicaid or managed care program needed by the patient, have an available bed and are willing to accept the patient.  12/10/2012  Patient/family informed of [facility]' ownership interest in [facility], as well as of the fact that they are under no obligation to receive care at this facility.  PASARR submitted to [employer] on 08/01/2009 PASARR number received from [employer] on 08/01/2009  FL2 transmitted to all facilities in geographic area requested by pt/family on  12/10/2012 FL2 transmitted to all facilities within larger geographic area on   Patient informed that his/her managed care company has contracts with or will negotiate with  certain facilities, including the following:     Patient/family informed of bed offers received:  12/11/12  Patient chooses bed at [facility] Physician recommends and patient chooses bed at    Patient to be transferred to  on  12/18/2012 Patient to be transferred to facility by [facility]  The following physician request were entered in Epic:   Additional Comments: Patient has existing PASARR. Patient's first choice [facility]. Patient excluded [facility]  from the SNF search.

## 2012-12-10 NOTE — Evaluation (Signed)
Physical Therapy Evaluation Patient Details Name: [name] [name] MRN: [id number] DOB: [birth year] Today's Date: 12/10/2012 Time: 8295-6213 PT Time Calculation (min): 20 min  PT Assessment / Plan / Recommendation History of Present Illness  s/p I&D septic Rt TKA   Clinical Impression  Patient is s/p arthroscopic I&D Rt knee and plica excision Rt medical femoral condyle resulting in functional limitations due to the deficits listed below (see PT Problem List). Patient will benefit from skilled PT to increase their independence and safety with mobility to allow discharge to the venue listed below. Pt is motivated to return home with daughter but spoke with pt about safest D/C planning due to amount of (A) needed at this time and home setup of daughter. Pt verbalized understanding that the safest recommendation at this time would be STSNF.      PT Assessment  Patient needs continued PT services    Follow Up Recommendations  SNF;Supervision/Assistance - 24 hour    Does the patient have the potential to tolerate intense rehabilitation      Barriers to Discharge Decreased caregiver support pt reports she has 24/7 (A); RN under the impression that her (A) is PRN    Equipment Recommendations  None recommended by PT (TBD )    Recommendations for Other Services     Frequency Min 6X/week    Precautions / Restrictions Precautions Precautions: Fall;Knee Restrictions Weight Bearing Restrictions: Yes RLE Weight Bearing: Non weight bearing   Pertinent Vitals/Pain 8/10; patient repositioned for comfort       Mobility  Bed Mobility Bed Mobility: Supine to Sit;Sitting - Scoot to Edge of Bed Supine to Sit: 4: Min assist;HOB elevated;With rails Sitting - Scoot to Edge of Bed: 4: Min assist;With rail Details for Bed Mobility Assistance: pt requires incr time due to pain; (A) to advance Rt LE to EOB; cues for hand placement and sequencing  Transfers Transfers: Sit to Stand;Stand to  Sit;Stand Pivot Transfers Sit to Stand: 1: +2 Total assist;From elevated surface;From bed;With upper extremity assist Sit to Stand: Patient Percentage: 50% Stand to Sit: 1: +2 Total assist;To chair/3-in-1;With armrests;With upper extremity assist Stand to Sit: Patient Percentage: 50% Stand Pivot Transfers: 1: +2 Total assist;From elevated surface;With armrests Stand Pivot Transfers: Patient Percentage: 40% Details for Transfer Assistance: pt requires incr time to complete transfers and max cues for sequencing;  2+ (A) to maintain balance and achieve upright standing position and control descent to chair  Ambulation/Gait Ambulation/Gait Assistance: Not tested (comment) Stairs: No Wheelchair Mobility Wheelchair Mobility: No    Exercises General Exercises - Lower Extremity Ankle Circles/Pumps: AROM;Both;10 reps;Supine   PT Diagnosis: Difficulty walking;Acute pain;Generalized weakness  PT Problem List: Decreased activity tolerance;Decreased strength;Decreased range of motion;Decreased balance;Decreased mobility;Decreased knowledge of use of DME;Decreased safety awareness;Pain PT Treatment Interventions: DME instruction;Gait training;Functional mobility training;Therapeutic activities;Therapeutic exercise;Balance training;Neuromuscular re-education;Patient/family education     PT Goals(Current goals can be found in the care plan section) Acute Rehab PT Goals Patient Stated Goal: to go home with my daughter PT Goal Formulation: With patient Time For Goal Achievement: 12/17/12 Potential to Achieve Goals: Good  Visit Information  Last PT Received On: 12/10/12 Assistance Needed: +2 History of Present Illness: s/p I&D septic Rt TKA        Prior Functioning  Home Living Family/patient expects to be discharged to:: Private residence Living Arrangements: Children Available Help at Discharge: Family;Friend(s);Available 24 hours/day Type of Home: House Home Access: Stairs to  enter [employer] of Steps: 1 Entrance Stairs-Rails: None Home Layout: Able  to live on main level with bedroom/bathroom Home Equipment: Dan Humphreys - 2 wheels;Cane - single point;Shower seat;Bedside commode Additional Comments: pt reports all of her equipment is in storage and she would be unable to get it out  Prior Function Level of Independence: Independent Comments: pt is living with her daughter and is able to sleep on couch downtairs  Communication Communication: No difficulties Dominant Hand: Left    Cognition  Cognition Arousal/Alertness: Awake/alert Behavior During Therapy: WFL for tasks assessed/performed Overall Cognitive Status: Within Functional Limits for tasks assessed    Extremity/Trunk Assessment Upper Extremity Assessment Upper Extremity Assessment: Defer to OT evaluation Lower Extremity Assessment Lower Extremity Assessment: RLE deficits/detail RLE: Unable to fully assess due to pain Cervical / Trunk Assessment Cervical / Trunk Assessment: Kyphotic   Balance Balance Balance Assessed: Yes Static Sitting Balance Static Sitting - Balance Support: Bilateral upper extremity supported;Feet unsupported Static Sitting - Level of Assistance: 5: Stand by assistance Static Sitting - Comment/# of Minutes: tolerated sitting EOB ~5 min to prepare for transfer  End of Session PT - End of Session Equipment Utilized During Treatment: Gait belt Activity Tolerance: Patient tolerated treatment well Patient left: in chair;with call bell/phone within reach Nurse Communication: Mobility status;Patient requests pain meds  GP     Donell Sievert, [place] [phone] 12/10/2012, 9:46 AM

## 2012-12-10 NOTE — Consult Note (Addendum)
[facility]  Total days of antibiotics 2        Day 2 vanco        Day 2 gent               Reason for Consult: bacteremia and pji    Referring Physician: murphy  Principal Problem:   Septic arthritis of knee, right Active Problems:   UTI (urinary tract infection)   Hyperlipidemia   Hypertension    HPI: [name] [name] is a 62 y.o. female with history of bilateral knee prosthetic joint replacements, last done on 07/2010 who presents with 4 day history of right knee pain, swelling and increasing warmth to touch to her right leg. It became increasingly painful unable to weight bear during ambulation. She denies recent trauma to leg, no fevers, but does endorse chills. seh states that she noticed having some red lesions on her lower right leg which she is unclear of the eitology. She went to the ED on 10/25 due to increasing symptoms and unable to be seen in the ambulatory setting. She was Found to have leukocytosis of 17.1 with 81%N, arthrocentesis showed total WBC of 65,300 and 98%PMNs, arthrocentesis cultures showing rare gpc. She did have concurrent blood cultures drawn which 2 sets are both growing [facility]. She was last seen in ED on 10/17 for bilateral shoulder pain thought to be due to cervical radiculopathy for which she is finishing steroid taper. She went to the OR on 10/15 for arthroscopic I x D and, plica excision of right medial femoral condyle. More cultures sent which are pending. She was started on vancomycin and gentamicin. She denis having fevers during this time, but it could possibly have been masked by the pred 40mg  daily she was taking up until Wednesday. She denies dysuria.  Past Medical History  Diagnosis Date  . Hyperlipidemia   . Hypertension     Allergies: No Known Allergies  MEDICATIONS: . amLODipine  10 mg Oral Daily   And  . irbesartan  150 mg Oral Daily  . atorvastatin  40 mg Oral q1800  .  ceFAZolin (ANCEF) IV  2 g Intravenous  Q8H  . docusate sodium  100 mg Oral BID  . enoxaparin (LOVENOX) injection  40 mg Subcutaneous Q24H  . nebivolol  10 mg Oral Daily  . rifampin  300 mg Oral Q12H  . vancomycin  1,500 mg Intravenous Q12H    History  Substance Use Topics  . Smoking status: Former Smoker    Quit date: 08/01/1992  . Smokeless tobacco: Never Used  . Alcohol Use: No    No family history on file.   Review of Systems  Constitutional: positive for chills, and diaphoresis. Negative for fever, activity change, appetite change, fatigue and unexpected weight change.  HENT: Negative for congestion, sore throat, rhinorrhea, sneezing, trouble swallowing and sinus pressure.  Eyes: Negative for photophobia and visual disturbance.  Respiratory: Negative for cough, chest tightness, shortness of breath, wheezing and stridor.  Cardiovascular: Negative for chest pain, palpitations and leg swelling.  Gastrointestinal: Negative for nausea, vomiting, abdominal pain, diarrhea, constipation, blood in stool, abdominal distention and anal bleeding.  Genitourinary: Negative for dysuria, hematuria, flank pain and difficulty urinating.  Musculoskeletal: Negative for myalgias, back pain, joint swelling, arthralgias and gait problem.  Skin: Negative for color change, pallor, rash and wound.  Neurological: Negative for dizziness, tremors, weakness and light-headedness.  Hematological: Negative for adenopathy. Does not bruise/bleed easily.  Psychiatric/Behavioral: Negative for  behavioral problems, confusion, sleep disturbance, dysphoric mood, decreased concentration and agitation.     OBJECTIVE: Temp:  [98.1 F (36.7 C)-99.5 F (37.5 C)] 99.5 F (37.5 C) (10/26 0625) Pulse Rate:  [99-110] 104 (10/26 0625) Resp:  [12-25] 20 (10/26 0625) BP: (141-192)/(54-112) 142/112 mmHg (10/26 0625) SpO2:  [92 %-99 %] 99 % (10/26 1610)  Physical Exam  Constitutional:  oriented to person, place, and time. appears well-developed and  well-nourished. No distress.  HENT:  Mouth/Throat: Oropharynx is clear and moist. No oropharyngeal exudate.  Cardiovascular: Normal rate, regular rhythm and normal heart sounds. Exam reveals no gallop and no friction rub.  No murmur heard.  Pulmonary/Chest: Effort normal and breath sounds normal. No respiratory distress.has no wheezes.  Abdominal: Soft. Bowel sounds are normal. He exhibits no distension. There is no tenderness.  Lymphadenopathy:  no cervical adenopathy.  Neurological:  alert and oriented to person, place, and time. Ext: right leg wrapped from surgery, right penrose drain in place. +1 edema  Skin: Skin is warm and dry. No rash noted. No erythema.  Psychiatric:  a normal mood and affect.  behavior is normal.    LABS: Results for orders placed during the hospital encounter of 12/09/12 (from the past 48 hour(s))  CBC WITH DIFFERENTIAL     Status: Abnormal   Collection Time    12/09/12  4:15 AM      Result Value Range   WBC 17.1 (*) 4.0 - 10.5 K/uL   RBC 4.87  3.87 - 5.11 MIL/uL   Hemoglobin 13.6  12.0 - 15.0 g/dL   HCT 96.0  45.4 - 09.8 %   MCV 85.2  78.0 - 100.0 fL   MCH 27.9  26.0 - 34.0 pg   MCHC 32.8  30.0 - 36.0 g/dL   RDW 11.9  14.7 - 82.9 %   Platelets 290  150 - 400 K/uL   Neutrophils Relative % 81 (*) 43 - 77 %   Neutro Abs 13.9 (*) 1.7 - 7.7 K/uL   Lymphocytes Relative 7 (*) 12 - 46 %   Lymphs Abs 1.2  0.7 - 4.0 K/uL   Monocytes Relative 11  3 - 12 %   Monocytes Absolute 2.0 (*) 0.1 - 1.0 K/uL   Eosinophils Relative 0  0 - 5 %   Eosinophils Absolute 0.0  0.0 - 0.7 K/uL   Basophils Relative 0  0 - 1 %   Basophils Absolute 0.0  0.0 - 0.1 K/uL  BASIC METABOLIC PANEL     Status: Abnormal   Collection Time    12/09/12  4:15 AM      Result Value Range   Sodium 135  135 - 145 mEq/L   Potassium 4.1  3.5 - 5.1 mEq/L   Chloride 99  96 - 112 mEq/L   CO2 25  19 - 32 mEq/L   Glucose, Bld 163 (*) 70 - 99 mg/dL   BUN 15  6 - 23 mg/dL   Creatinine, Ser 5.62   0.50 - 1.10 mg/dL   Calcium 9.5  8.4 - 13.0 mg/dL   GFR calc non Af Amer >90  >90 mL/min   GFR calc Af Amer >90  >90 mL/min   Comment: (NOTE)     The eGFR has been calculated using the CKD EPI equation.     This calculation has not been validated in all clinical situations.     eGFR's persistently <90 mL/min signify possible Chronic Kidney     Disease.  SEDIMENTATION  RATE     Status: Abnormal   Collection Time    12/09/12  4:15 AM      Result Value Range   Sed Rate 26 (*) 0 - 22 mm/hr  C-REACTIVE PROTEIN     Status: Abnormal   Collection Time    12/09/12  4:15 AM      Result Value Range   CRP 6.1 (*) <0.60 mg/dL   Comment: Performed at [facility]  CULTURE, BLOOD (ROUTINE X 2)     Status: None   Collection Time    12/09/12  4:15 AM      Result Value Range   Specimen Description BLOOD RIGHT ARM     Special Requests BOTTLES DRAWN AEROBIC AND ANAEROBIC 5CC     Culture  Setup Time       Value: 12/09/2012 13:17     Performed at [facility]   Culture       Value: GRAM POSITIVE COCCI IN CLUSTERS     Note: Gram Stain Report Called to,Read Back By and Verified With: Illene Regulus RN on 12/10/12 at 06:28 by Christie Nottingham     Performed at [facility]   Report Status PENDING    CG4 I-STAT (LACTIC ACID)     Status: None   Collection Time    12/09/12  4:30 AM      Result Value Range   Lactic Acid, Venous 1.87  0.5 - 2.2 mmol/L  CULTURE, BLOOD (ROUTINE X 2)     Status: None   Collection Time    12/09/12  4:48 AM      Result Value Range   Specimen Description BLOOD LEFT ARM     Special Requests BOTTLES DRAWN AEROBIC AND ANAEROBIC 3CC     Culture  Setup Time       Value: 12/09/2012 13:17     Performed at [facility]   Culture       Value: GRAM POSITIVE COCCI IN CLUSTERS     Note: Gram Stain Report Called to,Read Back By and Verified With: Illene Regulus RN on 12/10/12 at 03:25 by Christie Nottingham     Performed at [facility]   Report Status  PENDING    BODY FLUID CULTURE     Status: None   Collection Time    12/09/12  5:56 AM      Result Value Range   Specimen Description KNEE     Special Requests Normal     Gram Stain       Value: CYTOSPIN WBC PRESENT, PREDOMINANTLY PMN     NO ORGANISMS SEEN     Performed by [facility]     Performed at [facility]   Culture       Value: RARE GRAM POSITIVE COCCI     Note: CRITICAL RESULT CALLED TO, READ BACK BY AND VERIFIED WITH: Gwyndolyn Saxon @ [id number] 12/10/12 BY KRAWS     Performed at [facility]   Report Status PENDING    GRAM STAIN     Status: None   Collection Time    12/09/12  5:56 AM      Result Value Range   Specimen Description KNEE     Special Requests Normal     Gram Stain       Value: CYTOSPIN     WBC PRESENT, PREDOMINANTLY PMN     NO ORGANISMS SEEN     Gram Stain Report Called to,Read Back  By and Verified With: T.SMITH RN AT 0820 ON 25OCT14 BY C.BONGEL   Report Status 12/09/2012 FINAL    SYNOVIAL CELL COUNT + DIFF, W/ CRYSTALS     Status: Abnormal   Collection Time    12/09/12  5:56 AM      Result Value Range   Color, Synovial STRAW (*) YELLOW   Appearance-Synovial TURBID (*) CLEAR   Crystals, Fluid NO CRYSTALS SEEN     WBC, Synovial 96295 (*) 0 - 200 /cu mm   Neutrophil, Synovial 98 (*) 0 - 25 %   Lymphocytes-Synovial Fld 2  0 - 20 %  GLUCOSE, SYNOVIAL FLUID     Status: None   Collection Time    12/09/12  5:56 AM      Result Value Range   Glucose, Synovial Fluid 44     Comment:            THE NORMAL FLUID LEVEL IS USUALLY     EQUAL TO OR SLIGHTLY LESS THAN     THE NORMAL SERUM LEVEL (WITHIN     10 mg/dL).     PERFORMED AT [facility]     Performed at [facility]  GLUCOSE, CAPILLARY     Status: Abnormal   Collection Time    12/09/12  5:57 AM      Result Value Range   Glucose-Capillary 155 (*) 70 - 99 mg/dL   Comment 1 Notify RN    URINALYSIS, ROUTINE W REFLEX MICROSCOPIC     Status: Abnormal   Collection Time     12/09/12 12:03 PM      Result Value Range   Color, Urine YELLOW  YELLOW   APPearance CLOUDY (*) CLEAR   Specific Gravity, Urine 1.037 (*) 1.005 - 1.030   pH 5.5  5.0 - 8.0   Glucose, UA NEGATIVE  NEGATIVE mg/dL   Hgb urine dipstick SMALL (*) NEGATIVE   Bilirubin Urine NEGATIVE  NEGATIVE   Ketones, ur NEGATIVE  NEGATIVE mg/dL   Protein, ur 30 (*) NEGATIVE mg/dL   Urobilinogen, UA 1.0  0.0 - 1.0 mg/dL   Nitrite POSITIVE (*) NEGATIVE   Leukocytes, UA MODERATE (*) NEGATIVE  URINE MICROSCOPIC-ADD ON     Status: Abnormal   Collection Time    12/09/12 12:03 PM      Result Value Range   Squamous Epithelial / LPF RARE  RARE   WBC, UA 11-20  <3 WBC/hpf   RBC / HPF 3-6  <3 RBC/hpf   Bacteria, UA MANY (*) RARE   Casts HYALINE CASTS (*) NEGATIVE  MRSA PCR SCREENING     Status: None   Collection Time    12/09/12  1:37 PM      Result Value Range   MRSA by PCR NEGATIVE  NEGATIVE   Comment:            The GeneXpert MRSA Assay (FDA     approved for NASAL specimens     only), is one component of a     comprehensive MRSA colonization     surveillance program. It is not     intended to diagnose MRSA     infection nor to guide or     monitor treatment for     MRSA infections.  GLUCOSE, CAPILLARY     Status: Abnormal   Collection Time    12/09/12  1:42 PM      Result Value Range   Glucose-Capillary 109 (*) 70 - 99 mg/dL  HEMOGLOBIN A1C     Status: Abnormal   Collection Time    12/09/12  2:30 PM      Result Value Range   Hemoglobin A1C 6.6 (*) <5.7 %   Comment: (NOTE)                                                                               According to the ADA Clinical Practice Recommendations for 2011, when     HbA1c is used as a screening test:      >=6.5%   Diagnostic of Diabetes Mellitus               (if abnormal result is confirmed)     5.7-6.4%   Increased risk of developing Diabetes Mellitus     References:Diagnosis and Classification of Diabetes Mellitus,Diabetes      Care,2011,34(Suppl 1):S62-S69 and Standards of Medical Care in             Diabetes - 2011,Diabetes Care,2011,34 (Suppl 1):S11-S61.   Mean Plasma Glucose 143 (*) <117 mg/dL   Comment: Performed at [facility]  PROTIME-INR     Status: None   Collection Time    12/09/12  2:30 PM      Result Value Range   Prothrombin Time 13.0  11.6 - 15.2 seconds   INR 1.00  0.00 - 1.49  GRAM STAIN     Status: None   Collection Time    12/09/12  5:46 PM      Result Value Range   Specimen Description FLUID SYNOVIAL RIGHT KNEE     Special Requests PATIENT ON FOLLOWING ANCEF FLUID ON SWAB     Gram Stain       Value: FEW WBC PRESENT,BOTH PMN AND MONONUCLEAR     NO ORGANISMS SEEN     Gram Stain Report Called to,Read Back By and Verified With:     HANDY M,MD 1835 12/09/12 SCALES H   Report Status 12/09/2012 FINAL    ANAEROBIC CULTURE     Status: None   Collection Time    12/09/12  5:46 PM      Result Value Range   Specimen Description FLUID SYNOVIAL RIGHT KNEE     Special Requests PATIENT ON FOLLOWING ANCEF FLUID ON SWAB     Gram Stain       Value: FEW WBC PRESENT,BOTH PMN AND MONONUCLEAR     NO ORGANISMS SEEN     Gram Stain Report Called to,Read Back By and Verified With: Gram Stain Report Called to,Read Back By and Verified With: HANDY M MD 1835 12/09/12 SCALES H Performed at [facility]     Performed at [facility]   Culture       Value: NO ANAEROBES ISOLATED; CULTURE IN PROGRESS FOR 5 DAYS     Performed at [facility]   Report Status PENDING    BODY FLUID CULTURE     Status: None   Collection Time    12/09/12  5:46 PM      Result Value Range   Specimen Description FLUID SYNOVIAL RIGHT KNEE     Special Requests PATIENT ON FOLLOWING ANCEF FLUID ON SWAB  Gram Stain       Value: FEW WBC PRESENT,BOTH PMN AND MONONUCLEAR     NO ORGANISMS SEEN     Gram Stain Report Called to,Read Back By and Verified With: Gram Stain Report Called to,Read Back By and Verified  With: HANDY M MD 1835 12/09/12 SCALES H Performed at [facility]     Performed at [facility]   Culture       Value: NO GROWTH     Performed at [facility]   Report Status PENDING    GENTAMICIN LEVEL, RANDOM     Status: None   Collection Time    12/09/12  8:00 PM      Result Value Range   Gentamicin Rm 2.0     Comment:            Random Gentamicin therapeutic     range is dependent on dosage and     time of specimen collection.     A peak range is 5.0-10.0 ug/mL     A trough range is 0.5-2.0 ug/mL                Performed at [facility]  CBC     Status: Abnormal   Collection Time    12/09/12  9:40 PM      Result Value Range   WBC 17.4 (*) 4.0 - 10.5 K/uL   RBC 4.54  3.87 - 5.11 MIL/uL   Hemoglobin 12.9  12.0 - 15.0 g/dL   HCT 16.1  09.6 - 04.5 %   MCV 86.6  78.0 - 100.0 fL   MCH 28.4  26.0 - 34.0 pg   MCHC 32.8  30.0 - 36.0 g/dL   RDW 40.9  81.1 - 91.4 %   Platelets 241  150 - 400 K/uL  CREATININE, SERUM     Status: None   Collection Time    12/09/12  9:40 PM      Result Value Range   Creatinine, Ser 0.70  0.50 - 1.10 mg/dL   GFR calc non Af Amer >90  >90 mL/min   GFR calc Af Amer >90  >90 mL/min   Comment: (NOTE)     The eGFR has been calculated using the CKD EPI equation.     This calculation has not been validated in all clinical situations.     eGFR's persistently <90 mL/min signify possible Chronic Kidney     Disease.  BASIC METABOLIC PANEL     Status: Abnormal   Collection Time    12/10/12  6:05 AM      Result Value Range   Sodium 139  135 - 145 mEq/L   Potassium 4.3  3.5 - 5.1 mEq/L   Chloride 106  96 - 112 mEq/L   CO2 23  19 - 32 mEq/L   Glucose, Bld 149 (*) 70 - 99 mg/dL   BUN 10  6 - 23 mg/dL   Creatinine, Ser 7.82  0.50 - 1.10 mg/dL   Calcium 8.9  8.4 - 95.6 mg/dL   GFR calc non Af Amer 90 (*) >90 mL/min   GFR calc Af Amer >90  >90 mL/min   Comment: (NOTE)     The eGFR has been calculated using the CKD  EPI equation.     This calculation has not been validated in all clinical situations.     eGFR's persistently <90 mL/min signify possible Chronic Kidney     Disease.  MICRO: 10/25 synovial fluid OR cx: PENDING 10/25 synovial aspirate: rare GPC 10/25 blood cx x2: [facility]  IMAGING: Dg Chest 2 View  12/09/2012   CLINICAL DATA:  Medical clearance; history of smoking.  EXAM: CHEST  2 VIEW  COMPARISON:  Chest radiograph performed 07/21/2010  FINDINGS: The heart size and mediastinal contours are within normal limits. Both lungs are clear. Pulmonary vascularity is at the upper limits of normal. The visualized skeletal structures are unremarkable.  IMPRESSION: No active cardiopulmonary disease.   Electronically Signed   By: Roanna Raider M.D.   On: 12/09/2012 03:07   Dg Knee 2 Views Right  12/09/2012   CLINICAL DATA:  Right knee pain for 3 days.  EXAM: RIGHT KNEE - 1-2 VIEW  COMPARISON:  Right knee radiographs performed 07/29/2010  FINDINGS: There is no evidence of fracture or dislocation. The patient is status post total knee arthroplasty; the arthroplasty appears grossly intact, with minimal associated degenerative change.  A small knee joint effusion is suspected. The visualized soft tissues are otherwise unremarkable in appearance.  IMPRESSION: 1. No evidence of fracture or dislocation; right total knee arthroplasty appears intact. 2. Suspect small knee joint effusion.   Electronically Signed   By: Roanna Raider M.D.   On: 12/09/2012 02:58    Assessment/Plan:  17XW F with prosthetic joint infection of right knee s/p I x D c/b by gram positive bacteremia  1) prosthetic joint infection = recommend to start rifampin 300mg  BID either on Monday or Tuesday.optimal to have vancomycin and cefazolin started for 1-2 days prior to addition of rifampin. Acute onset of symptoms suggests early infection possibly due to staph aureus. Can discontinue gentamicin. She will need 6wks of IV therapy followed by at  least 4.5 months of oral antibiotics for prosthetic knee infection.   2) difficult venous access = please stick with PIVs until we document clearance of bacteremia for a picc line to be placed. Repeat blood cultures on monday  2) bacteremia = have added cefazolin in addn to vancomycin for now. We will narrow the regimen to a single agent once identification/sensitivities available. Will order repeat blood cultures on 10/27. Recommend TTE to evaluate for endocarditis.  3) asymptomatic bacturia= she does have evidence of pyuria, but does not sound like she was symptomatic. 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   Duke Salvia Drue Second MD MPH [facility] [phone]

## 2012-12-10 NOTE — Progress Notes (Signed)
Labs called in again; Anaerobic bottle gram positive cocci in clusters - Christie Nottingham in lab.

## 2012-12-11 ENCOUNTER — Encounter (HOSPITAL_COMMUNITY): Payer: Self-pay | Admitting: Orthopedic Surgery

## 2012-12-11 DIAGNOSIS — A4901 Methicillin susceptible Staphylococcus aureus infection, unspecified site: Secondary | ICD-10-CM

## 2012-12-11 DIAGNOSIS — I369 Nonrheumatic tricuspid valve disorder, unspecified: Secondary | ICD-10-CM

## 2012-12-11 DIAGNOSIS — E785 Hyperlipidemia, unspecified: Secondary | ICD-10-CM

## 2012-12-11 DIAGNOSIS — N390 Urinary tract infection, site not specified: Secondary | ICD-10-CM

## 2012-12-11 DIAGNOSIS — Z96659 Presence of unspecified artificial knee joint: Secondary | ICD-10-CM

## 2012-12-11 DIAGNOSIS — I10 Essential (primary) hypertension: Secondary | ICD-10-CM

## 2012-12-11 DIAGNOSIS — T8450XA Infection and inflammatory reaction due to unspecified internal joint prosthesis, initial encounter: Principal | ICD-10-CM

## 2012-12-11 DIAGNOSIS — M009 Pyogenic arthritis, unspecified: Secondary | ICD-10-CM

## 2012-12-11 LAB — CBC WITH DIFFERENTIAL/PLATELET
Basophils Relative: 0 % (ref 0–1)
Eosinophils Relative: 0 % (ref 0–5)
Hemoglobin: 12.1 g/dL (ref 12.0–15.0)
Lymphs Abs: 2.2 10*3/uL (ref 0.7–4.0)
MCHC: 31.9 g/dL (ref 30.0–36.0)
Monocytes Relative: 15 % — ABNORMAL HIGH (ref 3–12)
Neutro Abs: 8.3 10*3/uL — ABNORMAL HIGH (ref 1.7–7.7)
Neutrophils Relative %: 67 % (ref 43–77)
Platelets: 268 10*3/uL (ref 150–400)
RBC: 4.34 MIL/uL (ref 3.87–5.11)

## 2012-12-11 LAB — RENAL FUNCTION PANEL
Albumin: 2.7 g/dL — ABNORMAL LOW (ref 3.5–5.2)
CO2: 20 mEq/L (ref 19–32)
Calcium: 9.6 mg/dL (ref 8.4–10.5)
Creatinine, Ser: 0.73 mg/dL (ref 0.50–1.10)
GFR calc Af Amer: >90 mL/min (ref >90)
GFR calc non Af Amer: >90 mL/min (ref >90)
Phosphorus: 2.7 mg/dL (ref 2.3–4.6)
Sodium: 140 mEq/L (ref 135–145)

## 2012-12-11 MED ORDER — RIFAMPIN 300 MG PO CAPS
300.0000 mg | ORAL_CAPSULE | Freq: Two times a day (BID) | ORAL | Status: DC
  Administered 2012-12-11 – 2012-12-15 (×9): 300 mg via ORAL
  Filled 2012-12-11 (×10): qty 1

## 2012-12-11 NOTE — Progress Notes (Signed)
Echocardiogram 2D Echocardiogram has been performed.  [name] [name] 12/11/2012, 11:27 AM

## 2012-12-11 NOTE — Progress Notes (Signed)
[facility]  Date of Admission:  12/09/2012  Antibiotics: Antibiotics Given (last 72 hours)   Date/Time Action Medication Dose Rate   12/09/12 1210 Given  [Pt. did not receive at [facility] ED when ordered.]   vancomycin (VANCOCIN) 2,500 mg in sodium chloride 0.9 % 500 mL IVPB 2,500 mg 250 mL/hr   12/10/12 0017 Given   vancomycin (VANCOCIN) 1,500 mg in sodium chloride 0.9 % 500 mL IVPB 1,500 mg 250 mL/hr   12/10/12 1031 Given   gentamicin (GARAMYCIN) 600 mg in dextrose 5 % 100 mL IVPB 600 mg 115 mL/hr   12/10/12 1400 Given   vancomycin (VANCOCIN) 1,500 mg in sodium chloride 0.9 % 500 mL IVPB 1,500 mg 250 mL/hr   12/10/12 2231 Given  [med not on unit]   ceFAZolin (ANCEF) IVPB 2 g/50 mL premix 2 g 100 mL/hr   12/11/12 0444 Given   ceFAZolin (ANCEF) IVPB 2 g/50 mL premix 2 g 100 mL/hr      Subjective: Some pain but better  Objective: Temp:  [98.8 F (37.1 C)-99.2 F (37.3 C)] 99.2 F (37.3 C) (10/27 0634) Pulse Rate:  [103-106] 106 (10/27 0634) Resp:  [18] 18 (10/27 0634) BP: (138-149)/(57-60) 138/60 mmHg (10/27 0634) SpO2:  [96 %-97 %] 97 % (10/27 0634)  General: Awake, alert, nad Skin: no rashes Lungs: CTA B Cor: RRR Abdomen: soft, obese, nt, nd Ext: right leg wrapped  Lab Results Lab Results  Component Value Date   WBC 12.6* 12/11/2012   HGB 12.1 12/11/2012   HCT 37.9 12/11/2012   MCV 87.3 12/11/2012   PLT 268 12/11/2012    Lab Results  Component Value Date   CREATININE 0.73 12/11/2012   BUN 8 12/11/2012   NA 140 12/11/2012   K 4.4 12/11/2012   CL 108 12/11/2012   CO2 20 12/11/2012    Lab Results  Component Value Date   ALT 20 07/21/2010   AST 19 07/21/2010   ALKPHOS 91 07/21/2010   BILITOT 0.3 07/21/2010      Microbiology: Recent Results (from the past 240 hour(s))  CULTURE, BLOOD (ROUTINE X 2)     Status: None   Collection Time    12/09/12  4:15 AM      Result Value Range Status   Specimen Description BLOOD RIGHT ARM   Final    Special Requests BOTTLES DRAWN AEROBIC AND ANAEROBIC 5CC   Final   Culture  Setup Time     Final   Value: 12/09/2012 13:17     Performed at [facility]   Culture     Final   Value: STAPHYLOCOCCUS AUREUS     Note: RIFAMPIN AND GENTAMICIN SHOULD NOT BE USED AS SINGLE DRUGS FOR TREATMENT OF STAPH INFECTIONS.     Note: Gram Stain Report Called to,Read Back By and Verified With: Illene Regulus RN on 12/10/12 at 06:28 by Christie Nottingham     Performed at [facility]   Report Status PENDING   Incomplete  CULTURE, BLOOD (ROUTINE X 2)     Status: None   Collection Time    12/09/12  4:48 AM      Result Value Range Status   Specimen Description BLOOD LEFT ARM   Final   Special Requests BOTTLES DRAWN AEROBIC AND ANAEROBIC 3CC   Final   Culture  Setup Time     Final   Value: 12/09/2012 13:17     Performed at [facility]  Final   Value: GRAM POSITIVE COCCI IN CLUSTERS     Note: Gram Stain Report Called to,Read Back By and Verified With: Illene Regulus RN on 12/10/12 at 03:25 by Christie Nottingham     Performed at [facility]   Report Status PENDING   Incomplete  BODY FLUID CULTURE     Status: None   Collection Time    12/09/12  5:56 AM      Result Value Range Status   Specimen Description KNEE   Final   Special Requests Normal   Final   Gram Stain     Final   Value: CYTOSPIN WBC PRESENT, PREDOMINANTLY PMN     NO ORGANISMS SEEN     Performed by [facility]     Performed at [facility]   Culture     Final   Value: RARE GRAM POSITIVE COCCI     Note: CRITICAL RESULT CALLED TO, READ BACK BY AND VERIFIED WITH: Gwyndolyn Saxon @ 1610 12/10/12 BY KRAWS     Performed at [facility]   Report Status PENDING   Incomplete  GRAM STAIN     Status: None   Collection Time    12/09/12  5:56 AM      Result Value Range Status   Specimen Description KNEE   Final   Special Requests Normal   Final   Gram Stain     Final   Value: CYTOSPIN      WBC PRESENT, PREDOMINANTLY PMN     NO ORGANISMS SEEN     Gram Stain Report Called to,Read Back By and Verified With: T.SMITH RN AT 0820 ON 25OCT14 BY C.BONGEL   Report Status 12/09/2012 FINAL   Final  URINE CULTURE     Status: None   Collection Time    12/09/12 12:03 PM      Result Value Range Status   Specimen Description URINE, CATHETERIZED   Final   Special Requests NONE   Final   Culture  Setup Time     Final   Value: 12/09/2012 19:31     Performed at [facility]   Colony Count     Final   Value: NO GROWTH     Performed at [facility]   Culture     Final   Value: NO GROWTH     Performed at [facility]   Report Status 12/10/2012 FINAL   Final  MRSA PCR SCREENING     Status: None   Collection Time    12/09/12  1:37 PM      Result Value Range Status   MRSA by PCR NEGATIVE  NEGATIVE Final   Comment:            The GeneXpert MRSA Assay (FDA     approved for NASAL specimens     only), is one component of a     comprehensive MRSA colonization     surveillance program. It is not     intended to diagnose MRSA     infection nor to guide or     monitor treatment for     MRSA infections.  GRAM STAIN     Status: None   Collection Time    12/09/12  5:46 PM      Result Value Range Status   Specimen Description FLUID SYNOVIAL RIGHT KNEE   Final   Special Requests PATIENT ON FOLLOWING ANCEF FLUID ON SWAB   Final  Gram Stain     Final   Value: FEW WBC PRESENT,BOTH PMN AND MONONUCLEAR     NO ORGANISMS SEEN     Gram Stain Report Called to,Read Back By and Verified With:     HANDY M,MD 1835 12/09/12 SCALES [facility]   Report Status 12/09/2012 FINAL   Final  ANAEROBIC CULTURE     Status: None   Collection Time    12/09/12  5:46 PM      Result Value Range Status   Specimen Description FLUID SYNOVIAL RIGHT KNEE   Final   Special Requests PATIENT ON FOLLOWING ANCEF FLUID ON SWAB   Final   Gram Stain     Final   Value: FEW WBC PRESENT,BOTH PMN AND MONONUCLEAR      NO ORGANISMS SEEN     Gram Stain Report Called to,Read Back By and Verified With: Gram Stain Report Called to,Read Back By and Verified With: HANDY M MD 1835 12/09/12 SCALES H Performed at [facility]     Performed at [facility]   Culture     Final   Value: NO ANAEROBES ISOLATED; CULTURE IN PROGRESS FOR 5 DAYS     Performed at [facility]   Report Status PENDING   Incomplete  BODY FLUID CULTURE     Status: None   Collection Time    12/09/12  5:46 PM      Result Value Range Status   Specimen Description FLUID SYNOVIAL RIGHT KNEE   Final   Special Requests PATIENT ON FOLLOWING ANCEF FLUID ON SWAB   Final   Gram Stain     Final   Value: FEW WBC PRESENT,BOTH PMN AND MONONUCLEAR     NO ORGANISMS SEEN     Gram Stain Report Called to,Read Back By and Verified With: Gram Stain Report Called to,Read Back By and Verified With: HANDY M MD 1835 12/09/12 SCALES H Performed at [facility]     Performed at [facility]   Culture     Final   Value: NO GROWTH     Performed at [facility]   Report Status PENDING   Incomplete    Studies/Results: No results found.  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  On vancomycin and cefazolin pending sensitivities.  Will add rifampin.  Repeat blood cultures sent today.   -picc on Wed ok if repeat blood cultures remain negative -TTE pending   [name] [name], [name] Maduro, MD [facility] [email or web address] [phone] pager   [phone] cell 12/11/2012, 11:18 AM

## 2012-12-11 NOTE — Progress Notes (Signed)
[facility] Progress Note  Subjective  No new issues Doing well  Evaluated by ID yesterday Echo being done at bedside right now   Objective   BP 138/60  Pulse 106  Temp(Src) 99.2 F (37.3 C) (Oral)  Resp 18  Ht 5\' 5"  (1.651 m)  Wt 143.6 kg (316 lb 9.3 oz)  BMI 52.68 kg/m2  SpO2 97%  Intake/Output     10/26 0701 - 10/27 0700 10/27 0701 - 10/28 0700   P.O. 1840 240   I.V. (mL/kg) 1059.8 (7.4)    IV Piggyback 500    Total Intake(mL/kg) 3399.8 (23.7) 240 (1.7)   Urine (mL/kg/hr) 1550 (0.4)    Drains 85 (0)    Blood     Total Output 1635     Net +1764.8 +240        Urine Occurrence 6 x 1 x     Labs Results for [name] [name], [name] [name] (MRN [id number]) as of 12/11/2012 10:15  Ref. Range 12/11/2012 01:40  Sodium Latest Range: 135-145 mEq/L 140  Potassium Latest Range: 3.5-5.1 mEq/L 4.4  Chloride Latest Range: 96-112 mEq/L 108  CO2 Latest Range: 19-32 mEq/L 20  BUN Latest Range: 6-23 mg/dL 8  Creatinine Latest Range: 0.50-1.10 mg/dL 1.47  Calcium Latest Range: 8.4-10.5 mg/dL 9.6  GFR calc non Af Amer Latest Range: >90 mL/min >90  GFR calc Af Amer Latest Range: >90 mL/min >90  Glucose Latest Range: 70-99 mg/dL 829 (H)  Phosphorus Latest Range: 2.3-4.6 mg/dL 2.7  Albumin Latest Range: 3.5-5.2 g/dL 2.7 (L)  WBC Latest Range: 4.0-10.5 K/uL 12.6 (H)  RBC Latest Range: 3.87-5.11 MIL/uL 4.34  Hemoglobin Latest Range: 12.0-15.0 g/dL 56.2  HCT Latest Range: 36.0-46.0 % 37.9  MCV Latest Range: 78.0-100.0 fL 87.3  MCH Latest Range: 26.0-34.0 pg 27.9  MCHC Latest Range: 30.0-36.0 g/dL 13.0  RDW Latest Range: 11.5-15.5 % 14.9  Platelets Latest Range: 150-400 K/uL 268  Neutrophils Relative % Latest Range: 43-77 % 67  Lymphocytes Relative Latest Range: 12-46 % 18  Monocytes Relative Latest Range: 3-12 % 15 (H)  Eosinophils Relative Latest Range: 0-5 % 0  Basophils Relative Latest Range: 0-1 % 0  NEUT# Latest Range: 1.7-7.7 K/uL 8.3 (H)  Lymphocytes Absolute Latest  Range: 0.7-4.0 K/uL 2.2  Monocytes Absolute Latest Range: 0.1-1.0 K/uL 1.9 (H)  Eosinophils Absolute Latest Range: 0.0-0.7 K/uL 0.1  Basophils Absolute Latest Range: 0.0-0.1 K/uL 0.0    Synovial fluid cx- no growth  Urine cx- no growth  Exam  Gen: sitting in chair, NAD, appears well Lungs:clear Cardiac:RRR Abd: + BS,   Ext:        Left lower extremity               Dressing c/d/i             Distal motor and sensory functions intact             Drain patent, serosanguinous fluid present             Distal motor and sensory functions intact             Ext warm             + DP pulse                Assessment and Plan   POD/HD#: 2   62 y/o female with septic R TKA  1. Septic R TKA POD 2 arthroscopic I&D  Activity as tolerated             PT eval             Continue with hemovac drain             Dressing change tomorrow             Await cx                             2. UTI             Urine cx pending  3. ID             appreciate ID eval by Dr. Drue Second  Defer abx to ID  4. FEN             Diet as tolerated  5. DVT/PE prophylaxis             Lovenox  6. Dispo             Await cultures             Dr. Eulah Pont to determine definitive ortho course             Continue to monitor        Mearl Latin, PA-C [facility] [phone] (P) 12/11/2012 10:14 AM

## 2012-12-11 NOTE — Progress Notes (Signed)
Patient ID: [name] [name], female   DOB: 1950/07/08, 62 y.o.   MRN: [id number]  PROGRESS NOTE  Subjective:  negative for Chest Pain  negative for Shortness of Breath  negative for Nausea/Vomiting   negative for Calf Pain  negative for Bowel Movement   Tolerating Diet: yes         Patient reports pain as 5 on 0-10 scale.     Pt denies any fevers or chills.   Objective: Vital signs in last 24 hours:   Patient Vitals for the past 24 hrs:  BP Temp Temp src Pulse Resp SpO2  12/11/12 0634 138/60 mmHg 99.2 F (37.3 C) Oral 106 18 97 %  12/10/12 2122 149/57 mmHg 98.8 F (37.1 C) Oral 103 18 96 %      Intake/Output from previous day:   10/26 0701 - 10/27 0700 In: 3399.8 [P.O.:1840; I.V.:1059.8] Out: 1635 [Urine:1550; Drains:85]   Intake/Output this shift:   10/27 0701 - 10/27 1900 In: 240 [P.O.:240] Out: -    Intake/Output     10/26 0701 - 10/27 0700 10/27 0701 - 10/28 0700   P.O. 1840 240   I.V. (mL/kg) 1059.8 (7.4)    IV Piggyback 500    Total Intake(mL/kg) 3399.8 (23.7) 240 (1.7)   Urine (mL/kg/hr) 1550 (0.4)    Drains 85 (0)    Blood     Total Output 1635     Net +1764.8 +240        Urine Occurrence 6 x 1 x      LABORATORY DATA:  Recent Labs  12/09/12 0415 12/09/12 2140 12/11/12 0140  WBC 17.1* 17.4* 12.6*  HGB 13.6 12.9 12.1  HCT 41.5 39.3 37.9  PLT 290 241 268    Recent Labs  12/09/12 0415 12/09/12 2140 12/10/12 0605 12/11/12 0140  NA 135  --  139 140  K 4.1  --  4.3 4.4  CL 99  --  106 108  CO2 25  --  23 20  BUN 15  --  10 8  CREATININE 0.72 0.70 0.74 0.73  GLUCOSE 163*  --  149* 148*  CALCIUM 9.5  --  8.9 9.6   Lab Results  Component Value Date   INR 1.00 12/09/2012   INR 1.35 08/01/2010   INR 1.29 07/31/2010    Examination:  General appearance: alert, cooperative and no distress  Wound Exam: dressing inplace  Drainage: no drainage noted through dressing, hemovac in place  Motor Exam: grossly intact bilateral LE  Sensory  Exam: grossly intact bilateral LE  Vascular Exam: Normal  Assessment:    2 Days Post-Op  Procedure(s) (LRB): ARTHROSCOPIC IRRIGATION AND DEBRIDEMENT RIGHT KNEE (Right)  ADDITIONAL DIAGNOSIS:  Principal Problem:   Septic arthritis of knee, right Active Problems:   UTI (urinary tract infection)   Hyperlipidemia   Hypertension   Assessment and Plan   POD/HD#: 2  62 y/o female with septic R TKA  1. Septic R TKA POD 2 arthroscopic I&D  Activity as tolerated  PT eval  Continue with hemovac drain  Dressing change tomorrow  Await cx   2. UTI  Urine cx pending   3. ID  appreciate ID eval by Dr. Drue Second  Defer abx to ID   4. FEN  Diet as tolerated   5. DVT/PE prophylaxis  Lovenox   6. Dispo  Await cultures  TEE done today, results pending Hospitalist service consulted to help with medical management. Appreciate their help Dr. Eulah Pont to determine definitive ortho  course.  Continue to monitor           Wilkie Aye 12/11/2012, 12:22 PM

## 2012-12-11 NOTE — Progress Notes (Signed)
OT Cancellation Note and Discharge  Patient Details Name: [name] [name] MRN: [id number] DOB: 25-May-1950   Cancelled Treatment:    Reason Eval/Treat Not Completed: Other (comment). Pt's plan is to D/C to SNF and is already pre-registered at [facility], will defer OT eval to that facility, acute OT will sign off.  Evette Georges [phone] 12/11/2012, 8:19 AM

## 2012-12-11 NOTE — Care Management Utilization Note (Signed)
Utilization review completed. Lollie Gunner, RN BSN 

## 2012-12-11 NOTE — Consult Note (Signed)
Medical Consultation   [name] [name]  WUJ:[id number]  DOB: 1950/04/01  DOA: 12/09/2012  PCP: Lupita Raider, MD  Requesting physician: Mr. Domingo Cocking, PA  Reason for consultation: Medical management  History of Present Illness: This is a 62 year old female with a history of hypertension, hyperlipidemia, bilateral knee joint replacement that presented with increased right knee pain. Patient had been seen in orthopedics office twice within one week. Patient had presented to [facility] long hospital arthrocentesis at that time was performed showing an abundance of white blood cells in the synovial fluid. Patient was then transferred to [facility] for surgery. Patient underwent again arthroscopic irrigation and debridement of the right knee with plica excision of the right medial femoral condyle.  Consulted for medical management. Patient does have UTI with cultures currently pending. Arthrocentesis to show gram-positive cocci, rare. Blood culture showed 12/09/2012 also show gram-positive cocci in clusters.  Infectious diseases also been consulted and following the patient.  Currently patient has no complaints other than right knee pain. She does state that this pain has improved. She denies any chest pain, shortness of breath, palpitations, dizziness, abdominal pain, nausea, vomiting, diarrhea or constipation.  Allergies:  No Known Allergies    Past Medical History  Diagnosis Date  . Hyperlipidemia   . Hypertension     Past Surgical History  Procedure Laterality Date  . Joint replacement    . Knee surgery Bilateral     Social History:  reports that she quit smoking about 20 years ago. She has never used smokeless tobacco. She reports that she does not drink alcohol or use illicit drugs.  No family history on file.  Review of Systems:  Constitutional: Denies fever, chills, diaphoresis, appetite change and fatigue.  HEENT: Denies photophobia, eye pain, redness, hearing  loss, ear pain, congestion, sore throat, rhinorrhea, sneezing, mouth sores, trouble swallowing, neck pain, neck stiffness and tinnitus.   Respiratory: Denies SOB, DOE, cough, chest tightness,  and wheezing.   Cardiovascular: Denies chest pain, palpitations and leg swelling.  Gastrointestinal: Denies nausea, vomiting, abdominal pain, diarrhea, constipation, blood in stool and abdominal distention.  Genitourinary: Denies dysuria, urgency, frequency, hematuria, flank pain and difficulty urinating.  Musculoskeletal: Denies myalgias, back pain, arthralgias and gait problem.  Complains of right knee pain. Skin: Denies pallor, rash and wound.  Neurological: Denies dizziness, seizures, syncope, weakness, light-headedness, numbness and headaches.  Hematological: Denies adenopathy. Easy bruising, personal or family bleeding history  Psychiatric/Behavioral: Denies suicidal ideation, mood changes, confusion, nervousness, sleep disturbance and agitation   Physical Exam: Blood pressure 138/60, pulse 106, temperature 99.2 F (37.3 C), temperature source Oral, resp. rate 18, height 5\' 5"  (1.651 m), weight 143.6 kg (316 lb 9.3 oz), SpO2 97.00%.   General: Well developed, well nourished, NAD, appears stated age  HEENT: NCAT, PERRLA, EOMI, Anicteic Sclera, mucous membranes moist. No pharyngeal erythema or exudates  Neck: Supple, no JVD, no masses,  Cardiovascular: S1 S2 auscultated, no rubs, murmurs or gallops. Regular rate and rhythm.  Respiratory: Clear to auscultation bilaterally with equal chest rise  Abdomen: Soft, obese, nontender, nondistended, + bowel sounds  Extremities: warm dry without cyanosis clubbing or edema  Neuro: AAOx3, cranial nerves grossly intact. Strength 5/5 in patient's upper and lower extremities bilaterally  Skin: Without rashes exudates or nodules  Psych: Normal affect and demeanor with intact judgement and insight  Labs on Admission:  Basic Metabolic Panel:  Recent  Labs Lab 12/10/12 0605 12/11/12 0140  NA 139 140  K 4.3 4.4  CL 106  108  CO2 23 20  GLUCOSE 149* 148*  BUN 10 8  CREATININE 0.74 0.73  CALCIUM 8.9 9.6  PHOS  --  2.7   Liver Function Tests:  Recent Labs Lab 12/11/12 0140  ALBUMIN 2.7*   No results found for this basename: LIPASE, AMYLASE,  in the last 168 hours No results found for this basename: AMMONIA,  in the last 168 hours CBC:  Recent Labs Lab 12/09/12 2140 12/11/12 0140  WBC 17.4* 12.6*  NEUTROABS  --  8.3*  HGB 12.9 12.1  HCT 39.3 37.9  MCV 86.6 87.3  PLT 241 268   Cardiac Enzymes: No results found for this basename: CKTOTAL, CKMB, CKMBINDEX, TROPONINI,  in the last 168 hours BNP: No components found with this basename: POCBNP,  CBG:  Recent Labs Lab 12/09/12 0557 12/09/12 1342  GLUCAP 155* 109*    Inpatient Medications:   Scheduled Meds: . amLODipine  10 mg Oral Daily   And  . irbesartan  150 mg Oral Daily  . atorvastatin  40 mg Oral q1800  .  ceFAZolin (ANCEF) IV  2 g Intravenous Q8H  . docusate sodium  100 mg Oral BID  . enoxaparin (LOVENOX) injection  40 mg Subcutaneous Q24H  . nebivolol  10 mg Oral Daily  . rifampin  300 mg Oral Q12H  . vancomycin  1,500 mg Intravenous Q12H   Continuous Infusions: . 0.9 % NaCl with KCl 20 mEq / L 10 mL/hr at 12/11/12 0500     Radiological Exams on Admission: No results found.  Impression/Recommendations Principal Problem:   Septic arthritis of knee, right Active Problems:   UTI (urinary tract infection)   Hyperlipidemia   Hypertension  Septic arthritis of the right knee -Orthopedics, infectious disease currently following -Cultures from arthrocentesis to show gram-positive cocci -Blood cultures from 12/09/2012 also showed gram-positive cocci in clusters. -Patient undergoing TEE today along with repeat blood cultures. -Continue IV antibiotics of vancomycin and cefazolin, and rifampin and pain control. -Postop day 2 status post  arthrocentesis  Urinary tract infection -Urine culture still pending continue cefazolin and vancomycin infectious disease also on board  Hyperlipidemia - Continue atorvastatin  Hypertension -Currently controlled. -Continue amlodipine, Irbesartan, nebivolol.   DVT prophylaxis: Lovenox  Code Status: Full  I will followup again in the morning. Please contact me if I can be of assistance in the meanwhile. Thank you for this consultation.  Time Spent: 35 minutes  Aleksandra Raben D.O. [facility] 12/11/2012, 1:25 PM

## 2012-12-11 NOTE — Progress Notes (Signed)
Physical Therapy Treatment Patient Details Name: Beronica Lansdale MRN: [id number] DOB: January 14, 1951 Today's Date: 12/11/2012 Time: 4098-1191 PT Time Calculation (min): 24 min  PT Assessment / Plan / Recommendation  History of Present Illness s/p I&D septic Rt TKA    PT Comments   Pt slowly progressing with therapy. Pt requires incr time for all mobility and transfers due to pain. Pt is motivated to increase independence. Pt agreeable to SNF and is awaiting placement. Will cont to follow per POC.   Follow Up Recommendations  SNF;Supervision/Assistance - 24 hour     Does the patient have the potential to tolerate intense rehabilitation     Barriers to Discharge        Equipment Recommendations  None recommended by PT    Recommendations for Other Services    Frequency Min 6X/week   Progress towards PT Goals Progress towards PT goals: Progressing toward goals  Plan Current plan remains appropriate    Precautions / Restrictions Precautions Precautions: Fall;Knee Restrictions Weight Bearing Restrictions: Yes RLE Weight Bearing: Weight bearing as tolerated   Pertinent Vitals/Pain 5/10; repositioned in bed for comfort     Mobility  Bed Mobility Bed Mobility: Sit to Supine;Scooting to HOB Sit to Supine: 4: Min assist;HOB flat;With rail Scooting to [employer]: With rail;With trapeze;5: Supervision Details for Bed Mobility Assistance: pt requires increased time for bed mobility; demo difficulty lifting Rt LE onto bed and requires (A). pt relies on trapeze bar and hand rails to scoot in bed; cues for sequencing  Transfers Transfers: Stand to Sit;Sit to Stand;Stand Pivot Transfers Sit to Stand: 1: +2 Total assist;From chair/3-in-1;With armrests;With upper extremity assist Sit to Stand: Patient Percentage: 60% Stand to Sit: 4: Min assist;To chair/3-in-1;With armrests;With upper extremity assist;To bed Stand Pivot Transfers: 1: +2 Total assist;From elevated surface;With armrests Stand Pivot  Transfers: Patient Percentage: 60% Details for Transfer Assistance: pt requires 2+ for transfers due to difficulty powering up secondary to pain with WB through Rt LE; cues for hand placement and safety; pt was transferred back to chair then requires SPT to get back into bed for procedure. pt requires incr time for all transfers due to pain  Ambulation/Gait Ambulation/Gait Assistance: 4: Min assist Ambulation Distance (Feet): 20 Feet Assistive device: Rolling walker Ambulation/Gait Assistance Details: cues for gt sequencing and upright posture; pt requires incr time and amb at decreased speed; pt requires rest breaks due to UE fatigue; pt amb with step to gt and shuffles Lt foot for stepping due to pain with weightshifting on Rt LE  Gait Pattern: Step-to pattern;Decreased stance time - right;Decreased step length - left;Shuffle;Wide base of support;Trunk flexed Gait velocity: very decreased due to pain and fatigue  Stairs: No Wheelchair Mobility Wheelchair Mobility: No    Exercises General Exercises - Lower Extremity Ankle Circles/Pumps: AROM;Both;10 reps;Seated Long Arc Quad: AAROM;Right;10 reps;Seated   PT Diagnosis:    PT Problem List:   PT Treatment Interventions:     PT Goals (current goals can now be found in the care plan section) Acute Rehab PT Goals Patient Stated Goal: to get to where i can eventually go home  PT Goal Formulation: With patient Time For Goal Achievement: 12/17/12 Potential to Achieve Goals: Good  Visit Information  Last PT Received On: 12/11/12 Assistance Needed: +2 (for sit to stand ) History of Present Illness: s/p I&D septic Rt TKA     Subjective Data  Subjective: pt sitting in chair " how far are you wanting me to walk today?" agreeable to therapy  Patient Stated Goal: to get to where i can eventually go home    Cognition  Cognition Arousal/Alertness: Awake/alert Behavior During Therapy: WFL for tasks assessed/performed Overall Cognitive Status:  Within Functional Limits for tasks assessed    Balance  Balance Balance Assessed: Yes Static Sitting Balance Static Sitting - Balance Support: No upper extremity supported;Feet supported Static Sitting - Level of Assistance: 5: Stand by assistance Static Sitting - Comment/# of Minutes: tolerated sitting EOB ~3 min to rest and prepare for return to supine   End of Session PT - End of Session Equipment Utilized During Treatment: Gait belt Activity Tolerance: Patient tolerated treatment well Patient left: in bed;with call bell/phone within reach Nurse Communication: Mobility status   GP     Donell Sievert, [place] [phone] 12/11/2012, 10:27 AM

## 2012-12-12 DIAGNOSIS — M79609 Pain in unspecified limb: Secondary | ICD-10-CM

## 2012-12-12 LAB — BODY FLUID CULTURE: Special Requests: NORMAL

## 2012-12-12 LAB — CULTURE, BLOOD (ROUTINE X 2)

## 2012-12-12 NOTE — Progress Notes (Signed)
[facility] PROGRESS NOTE  [name] [name] ZOX:[id number] DOB: 1950/03/24 DOA: 12/09/2012 PCP: Lupita Raider, MD  Assessment/Plan: Septic arthritis of the right knee  -Cultures positive for methicillin sensitive Staphylococcus aureus. Infectious disease following, recommending 6 week treatment with IV Cefazolin and rifampin. Repeat blood cultures performed on 12/11/2012 showing no growth.  Urinary tract infection  -Urine cultures drawn on 12/09/2012 showing no growth. Patient on empiric IV antibiotic therapy for septic arthritis Hyperlipidemia  - Continue atorvastatin  Hypertension  -Currently controlled.  -Continue amlodipine, Irbesartan, nebivolol.    Code Status: Full code Disposition Plan: Patient will require 6 weeks of IV antibiotic therapy   Procedures:  Transthoracic echocardiogram performed on 12/11/2012 impression: Estimated ejection fraction 65-70%, no wall motion abnormalities, grade 2 diastolic dysfunction  Antibiotics:  Cefazolin  Rifampin  HPI/Subjective: Patient states feeling better today, had ultrasound today which did not show evidence of DVT per preliminary report. She did spike a temperature of 100.9. Otherwise she is tolerating by mouth intake, states pain is well-controlled.  Objective: Filed Vitals:   12/12/12 1400  BP: 107/70  Pulse: 102  Temp: 100.9 F (38.3 C)  Resp: 16    Intake/Output Summary (Last 24 hours) at 12/12/12 1734 Last data filed at 12/12/12 1200  Gross per 24 hour  Intake   1970 ml  Output     35 ml  Net   1935 ml   Filed Weights   12/09/12 0900 12/09/12 1300  Weight: 143.79 kg (317 lb) 143.6 kg (316 lb 9.3 oz)    Exam:   General:  No acute distress, she is pleasant cooperative and nontoxic-appearing  Cardiovascular: Regular rate rhythm normal S1-S2  Respiratory: Clear to auscultation bilaterally  Abdomen: Soft nontender nondistended  Musculoskeletal: There is some tenderness to palpation over her right  knee. I do not no erythema or worsening swelling  Data Reviewed: Basic Metabolic Panel:  Recent Labs Lab 12/09/12 0415 12/09/12 2140 12/10/12 0605 12/11/12 0140  NA 135  --  139 140  K 4.1  --  4.3 4.4  CL 99  --  106 108  CO2 25  --  23 20  GLUCOSE 163*  --  149* 148*  BUN 15  --  10 8  CREATININE 0.72 0.70 0.74 0.73  CALCIUM 9.5  --  8.9 9.6  PHOS  --   --   --  2.7   Liver Function Tests:  Recent Labs Lab 12/11/12 0140  ALBUMIN 2.7*   No results found for this basename: LIPASE, AMYLASE,  in the last 168 hours No results found for this basename: AMMONIA,  in the last 168 hours CBC:  Recent Labs Lab 12/09/12 0415 12/09/12 2140 12/11/12 0140  WBC 17.1* 17.4* 12.6*  NEUTROABS 13.9*  --  8.3*  HGB 13.6 12.9 12.1  HCT 41.5 39.3 37.9  MCV 85.2 86.6 87.3  PLT 290 241 268   Cardiac Enzymes: No results found for this basename: CKTOTAL, CKMB, CKMBINDEX, TROPONINI,  in the last 168 hours BNP (last 3 results) No results found for this basename: PROBNP,  in the last 8760 hours CBG:  Recent Labs Lab 12/09/12 0557 12/09/12 1342  GLUCAP 155* 109*    Recent Results (from the past 240 hour(s))  CULTURE, BLOOD (ROUTINE X 2)     Status: None   Collection Time    12/09/12  4:15 AM      Result Value Range Status   Specimen Description BLOOD RIGHT ARM   Final   Special Requests  BOTTLES DRAWN AEROBIC AND ANAEROBIC 5CC   Final   Culture  Setup Time     Final   Value: 12/09/2012 13:17     Performed at [facility]   Culture     Final   Value: STAPHYLOCOCCUS AUREUS     Note: RIFAMPIN AND GENTAMICIN SHOULD NOT BE USED AS SINGLE DRUGS FOR TREATMENT OF STAPH INFECTIONS.     Note: Gram Stain Report Called to,Read Back By and Verified With: Illene Regulus RN on 12/10/12 at 06:28 by Christie Nottingham     Performed at [facility]   Report Status 12/12/2012 FINAL   Final   Organism ID, Bacteria STAPHYLOCOCCUS AUREUS   Final  CULTURE, BLOOD (ROUTINE X 2)      Status: None   Collection Time    12/09/12  4:48 AM      Result Value Range Status   Specimen Description BLOOD LEFT ARM   Final   Special Requests BOTTLES DRAWN AEROBIC AND ANAEROBIC 3CC   Final   Culture  Setup Time     Final   Value: 12/09/2012 13:17     Performed at [facility]   Culture     Final   Value: STAPHYLOCOCCUS AUREUS     Note: SUSCEPTIBILITIES PERFORMED ON PREVIOUS CULTURE WITHIN THE LAST 5 DAYS.     Note: Gram Stain Report Called to,Read Back By and Verified With: Illene Regulus RN on 12/10/12 at 03:25 by Christie Nottingham     Performed at [facility]   Report Status 12/12/2012 FINAL   Final  BODY FLUID CULTURE     Status: None   Collection Time    12/09/12  5:56 AM      Result Value Range Status   Specimen Description KNEE   Final   Special Requests Normal   Final   Gram Stain     Final   Value: CYTOSPIN WBC PRESENT, PREDOMINANTLY PMN     NO ORGANISMS SEEN     Performed by [facility]     Performed at [facility]   Culture     Final   Value: RARE STAPHYLOCOCCUS AUREUS     Note: CRITICAL RESULT CALLED TO, READ BACK BY AND VERIFIED WITH: SHARON W @ 1610 12/10/12 BY KRAWS RIFAMPIN AND GENTAMICIN SHOULD NOT BE USED AS SINGLE DRUGS FOR TREATMENT OF STAPH INFECTIONS.     Performed at [facility]   Report Status 12/12/2012 FINAL   Final   Organism ID, Bacteria STAPHYLOCOCCUS AUREUS   Final  GRAM STAIN     Status: None   Collection Time    12/09/12  5:56 AM      Result Value Range Status   Specimen Description KNEE   Final   Special Requests Normal   Final   Gram Stain     Final   Value: CYTOSPIN     WBC PRESENT, PREDOMINANTLY PMN     NO ORGANISMS SEEN     Gram Stain Report Called to,Read Back By and Verified With: T.SMITH RN AT 0820 ON 25OCT14 BY C.BONGEL   Report Status 12/09/2012 FINAL   Final  URINE CULTURE     Status: None   Collection Time    12/09/12 12:03 PM      Result Value Range Status   Specimen Description  URINE, CATHETERIZED   Final   Special Requests NONE   Final   Culture  Setup Time     Final  Value: 12/09/2012 19:31     Performed at [facility]     Final   Value: NO GROWTH     Performed at [facility]   Culture     Final   Value: NO GROWTH     Performed at [facility]   Report Status 12/10/2012 FINAL   Final  MRSA PCR SCREENING     Status: None   Collection Time    12/09/12  1:37 PM      Result Value Range Status   MRSA by PCR NEGATIVE  NEGATIVE Final   Comment:            The GeneXpert MRSA Assay (FDA     approved for NASAL specimens     only), is one component of a     comprehensive MRSA colonization     surveillance program. It is not     intended to diagnose MRSA     infection nor to guide or     monitor treatment for     MRSA infections.  GRAM STAIN     Status: None   Collection Time    12/09/12  5:46 PM      Result Value Range Status   Specimen Description FLUID SYNOVIAL RIGHT KNEE   Final   Special Requests PATIENT ON FOLLOWING ANCEF FLUID ON SWAB   Final   Gram Stain     Final   Value: FEW WBC PRESENT,BOTH PMN AND MONONUCLEAR     NO ORGANISMS SEEN     Gram Stain Report Called to,Read Back By and Verified With:     HANDY M,MD 1835 12/09/12 [facility]   Report Status 12/09/2012 FINAL   Final  ANAEROBIC CULTURE     Status: None   Collection Time    12/09/12  5:46 PM      Result Value Range Status   Specimen Description FLUID SYNOVIAL RIGHT KNEE   Final   Special Requests PATIENT ON FOLLOWING ANCEF FLUID ON SWAB   Final   Gram Stain     Final   Value: FEW WBC PRESENT,BOTH PMN AND MONONUCLEAR     NO ORGANISMS SEEN     Gram Stain Report Called to,Read Back By and Verified With: Gram Stain Report Called to,Read Back By and Verified With: HANDY M MD 1835 12/09/12 SCALES H Performed at [facility]     Performed at [facility]   Culture     Final   Value: NO ANAEROBES ISOLATED; CULTURE IN PROGRESS FOR 5 DAYS      Performed at [facility]   Report Status PENDING   Incomplete  BODY FLUID CULTURE     Status: None   Collection Time    12/09/12  5:46 PM      Result Value Range Status   Specimen Description FLUID SYNOVIAL RIGHT KNEE   Final   Special Requests PATIENT ON FOLLOWING ANCEF FLUID ON SWAB   Final   Gram Stain     Final   Value: FEW WBC PRESENT,BOTH PMN AND MONONUCLEAR     NO ORGANISMS SEEN     Gram Stain Report Called to,Read Back By and Verified With: Gram Stain Report Called to,Read Back By and Verified With: HANDY M MD 1835 12/09/12 SCALES H Performed at [facility]     Performed at [facility]   Culture     Final   Value: FEW STAPHYLOCOCCUS  AUREUS     Note: RIFAMPIN AND GENTAMICIN SHOULD NOT BE USED AS SINGLE DRUGS FOR TREATMENT OF STAPH INFECTIONS.     Performed at [facility]   Report Status 12/12/2012 FINAL   Final   Organism ID, Bacteria STAPHYLOCOCCUS AUREUS   Final  CULTURE, BLOOD (ROUTINE X 2)     Status: None   Collection Time    12/11/12  1:40 AM      Result Value Range Status   Specimen Description BLOOD RIGHT ARM   Final   Special Requests BOTTLES DRAWN AEROBIC AND ANAEROBIC 10CC EACH   Final   Culture  Setup Time     Final   Value: 12/11/2012 08:34     Performed at [facility]   Culture     Final   Value:        BLOOD CULTURE RECEIVED NO GROWTH TO DATE CULTURE WILL BE HELD FOR 5 DAYS BEFORE ISSUING A FINAL NEGATIVE REPORT     Performed at [facility]   Report Status PENDING   Incomplete  CULTURE, BLOOD (ROUTINE X 2)     Status: None   Collection Time    12/11/12  1:45 AM      Result Value Range Status   Specimen Description BLOOD RIGHT HAND   Final   Special Requests BOTTLES DRAWN AEROBIC ONLY 10CC   Final   Culture  Setup Time     Final   Value: 12/11/2012 08:36     Performed at [facility]   Culture     Final   Value:        BLOOD CULTURE RECEIVED NO GROWTH TO DATE CULTURE WILL BE HELD FOR 5  DAYS BEFORE ISSUING A FINAL NEGATIVE REPORT     Performed at [facility]   Report Status PENDING   Incomplete     Studies: No results found.  Scheduled Meds: . amLODipine  10 mg Oral Daily   And  . irbesartan  150 mg Oral Daily  . atorvastatin  40 mg Oral q1800  .  ceFAZolin (ANCEF) IV  2 g Intravenous Q8H  . docusate sodium  100 mg Oral BID  . enoxaparin (LOVENOX) injection  40 mg Subcutaneous Q24H  . nebivolol  10 mg Oral Daily  . rifampin  300 mg Oral Q12H   Continuous Infusions: . 0.9 % NaCl with KCl 20 mEq / L 10 mL/hr at 12/11/12 0500    Principal Problem:   Septic arthritis of knee, right Active Problems:   UTI (urinary tract infection)   Hyperlipidemia   Hypertension    Time spent: 30    Jeralyn Bennett  [facility] Pager [phone]. If 7PM-7AM, please contact night-coverage at [email or web address], password [facility] 12/12/2012, 5:34 PM  LOS: 3 days

## 2012-12-12 NOTE — Progress Notes (Addendum)
[facility]  Date of Admission:  12/09/2012  Antibiotics: Antibiotics Given (last 72 hours)   Date/Time Action Medication Dose Rate   12/09/12 1210 Given  [Pt. did not receive at [facility] ED when ordered.]   vancomycin (VANCOCIN) 2,500 mg in sodium chloride 0.9 % 500 mL IVPB 2,500 mg 250 mL/hr   12/10/12 0017 Given   vancomycin (VANCOCIN) 1,500 mg in sodium chloride 0.9 % 500 mL IVPB 1,500 mg 250 mL/hr   12/10/12 1031 Given   gentamicin (GARAMYCIN) 600 mg in dextrose 5 % 100 mL IVPB 600 mg 115 mL/hr   12/10/12 1400 Given   vancomycin (VANCOCIN) 1,500 mg in sodium chloride 0.9 % 500 mL IVPB 1,500 mg 250 mL/hr   12/10/12 2231 Given  [med not on unit]   ceFAZolin (ANCEF) IVPB 2 g/50 mL premix 2 g 100 mL/hr   12/11/12 0444 Given   ceFAZolin (ANCEF) IVPB 2 g/50 mL premix 2 g 100 mL/hr   12/11/12 1346 Given   ceFAZolin (ANCEF) IVPB 2 g/50 mL premix 2 g 100 mL/hr   12/11/12 1355 Given   rifampin (RIFADIN) capsule 300 mg 300 mg    12/11/12 1742 Given   vancomycin (VANCOCIN) 1,500 mg in sodium chloride 0.9 % 500 mL IVPB 1,500 mg 250 mL/hr   12/11/12 1951 Given   ceFAZolin (ANCEF) IVPB 2 g/50 mL premix 2 g 100 mL/hr   12/11/12 2122 Given   rifampin (RIFADIN) capsule 300 mg 300 mg    12/12/12 0355 Given   ceFAZolin (ANCEF) IVPB 2 g/50 mL premix 2 g 100 mL/hr   12/12/12 0510 Given   vancomycin (VANCOCIN) 1,500 mg in sodium chloride 0.9 % 500 mL IVPB 1,500 mg 250 mL/hr      Subjective: No complaints  Objective: Temp:  [100 F (37.8 C)-100.4 F (38 C)] 100.4 F (38 C) (10/27 2000) Pulse Rate:  [105-110] 110 (10/27 2000) Resp:  [18-20] 18 (10/27 2000) BP: (136-163)/(64-65) 163/64 mmHg (10/27 2000) SpO2:  [93 %-95 %] 93 % (10/27 2000)  General: Awake, alert, nad Skin: no rashes Lungs: CTA B Cor: RRR Abdomen: soft, obese, nt, nd Ext: right leg wrapped  Lab Results Lab Results  Component Value Date   WBC 12.6* 12/11/2012   HGB 12.1 12/11/2012   HCT 37.9 12/11/2012   MCV 87.3 12/11/2012   PLT 268 12/11/2012    Lab Results  Component Value Date   CREATININE 0.73 12/11/2012   BUN 8 12/11/2012   NA 140 12/11/2012   K 4.4 12/11/2012   CL 108 12/11/2012   CO2 20 12/11/2012    Lab Results  Component Value Date   ALT 20 07/21/2010   AST 19 07/21/2010   ALKPHOS 91 07/21/2010   BILITOT 0.3 07/21/2010      Microbiology: Recent Results (from the past 240 hour(s))  CULTURE, BLOOD (ROUTINE X 2)     Status: None   Collection Time    12/09/12  4:15 AM      Result Value Range Status   Specimen Description BLOOD RIGHT ARM   Final   Special Requests BOTTLES DRAWN AEROBIC AND ANAEROBIC 5CC   Final   Culture  Setup Time     Final   Value: 12/09/2012 13:17     Performed at [facility]   Culture     Final   Value: STAPHYLOCOCCUS AUREUS     Note: RIFAMPIN AND GENTAMICIN SHOULD NOT BE USED AS SINGLE DRUGS FOR TREATMENT OF  STAPH INFECTIONS.     Note: Gram Stain Report Called to,Read Back By and Verified With: Illene Regulus RN on 12/10/12 at 06:28 by Christie Nottingham     Performed at [facility]   Report Status 12/12/2012 FINAL   Final   Organism ID, Bacteria STAPHYLOCOCCUS AUREUS   Final  CULTURE, BLOOD (ROUTINE X 2)     Status: None   Collection Time    12/09/12  4:48 AM      Result Value Range Status   Specimen Description BLOOD LEFT ARM   Final   Special Requests BOTTLES DRAWN AEROBIC AND ANAEROBIC 3CC   Final   Culture  Setup Time     Final   Value: 12/09/2012 13:17     Performed at [facility]   Culture     Final   Value: STAPHYLOCOCCUS AUREUS     Note: SUSCEPTIBILITIES PERFORMED ON PREVIOUS CULTURE WITHIN THE LAST 5 DAYS.     Note: Gram Stain Report Called to,Read Back By and Verified With: Illene Regulus RN on 12/10/12 at 03:25 by Christie Nottingham     Performed at [facility]   Report Status 12/12/2012 FINAL   Final  BODY FLUID CULTURE     Status: None   Collection Time    12/09/12  5:56 AM       Result Value Range Status   Specimen Description KNEE   Final   Special Requests Normal   Final   Gram Stain     Final   Value: CYTOSPIN WBC PRESENT, PREDOMINANTLY PMN     NO ORGANISMS SEEN     Performed by [facility]     Performed at [facility]   Culture     Final   Value: RARE STAPHYLOCOCCUS AUREUS     Note: CRITICAL RESULT CALLED TO, READ BACK BY AND VERIFIED WITH: SHARON W @ 1610 12/10/12 BY KRAWS RIFAMPIN AND GENTAMICIN SHOULD NOT BE USED AS SINGLE DRUGS FOR TREATMENT OF STAPH INFECTIONS.     Performed at [facility]   Report Status 12/12/2012 FINAL   Final   Organism ID, Bacteria STAPHYLOCOCCUS AUREUS   Final  GRAM STAIN     Status: None   Collection Time    12/09/12  5:56 AM      Result Value Range Status   Specimen Description KNEE   Final   Special Requests Normal   Final   Gram Stain     Final   Value: CYTOSPIN     WBC PRESENT, PREDOMINANTLY PMN     NO ORGANISMS SEEN     Gram Stain Report Called to,Read Back By and Verified With: T.SMITH RN AT 0820 ON 25OCT14 BY C.BONGEL   Report Status 12/09/2012 FINAL   Final  URINE CULTURE     Status: None   Collection Time    12/09/12 12:03 PM      Result Value Range Status   Specimen Description URINE, CATHETERIZED   Final   Special Requests NONE   Final   Culture  Setup Time     Final   Value: 12/09/2012 19:31     Performed at [facility] Count     Final   Value: NO GROWTH     Performed at [facility]   Culture     Final   Value: NO GROWTH     Performed at [facility]   Report Status 12/10/2012 FINAL  Final  MRSA PCR SCREENING     Status: None   Collection Time    12/09/12  1:37 PM      Result Value Range Status   MRSA by PCR NEGATIVE  NEGATIVE Final   Comment:            The GeneXpert MRSA Assay (FDA     approved for NASAL specimens     only), is one component of a     comprehensive MRSA colonization     surveillance program. It is not     intended  to diagnose MRSA     infection nor to guide or     monitor treatment for     MRSA infections.  GRAM STAIN     Status: None   Collection Time    12/09/12  5:46 PM      Result Value Range Status   Specimen Description FLUID SYNOVIAL RIGHT KNEE   Final   Special Requests PATIENT ON FOLLOWING ANCEF FLUID ON SWAB   Final   Gram Stain     Final   Value: FEW WBC PRESENT,BOTH PMN AND MONONUCLEAR     NO ORGANISMS SEEN     Gram Stain Report Called to,Read Back By and Verified With:     HANDY M,MD 1835 12/09/12 [facility]   Report Status 12/09/2012 FINAL   Final  ANAEROBIC CULTURE     Status: None   Collection Time    12/09/12  5:46 PM      Result Value Range Status   Specimen Description FLUID SYNOVIAL RIGHT KNEE   Final   Special Requests PATIENT ON FOLLOWING ANCEF FLUID ON SWAB   Final   Gram Stain     Final   Value: FEW WBC PRESENT,BOTH PMN AND MONONUCLEAR     NO ORGANISMS SEEN     Gram Stain Report Called to,Read Back By and Verified With: Gram Stain Report Called to,Read Back By and Verified With: HANDY M MD 1835 12/09/12 SCALES H Performed at [facility]     Performed at [facility]   Culture     Final   Value: NO ANAEROBES ISOLATED; CULTURE IN PROGRESS FOR 5 DAYS     Performed at [facility]   Report Status PENDING   Incomplete  BODY FLUID CULTURE     Status: None   Collection Time    12/09/12  5:46 PM      Result Value Range Status   Specimen Description FLUID SYNOVIAL RIGHT KNEE   Final   Special Requests PATIENT ON FOLLOWING ANCEF FLUID ON SWAB   Final   Gram Stain     Final   Value: FEW WBC PRESENT,BOTH PMN AND MONONUCLEAR     NO ORGANISMS SEEN     Gram Stain Report Called to,Read Back By and Verified With: Gram Stain Report Called to,Read Back By and Verified With: HANDY M MD 1835 12/09/12 SCALES H Performed at [facility]     Performed at [facility]   Culture     Final   Value: FEW STAPHYLOCOCCUS AUREUS     Note: RIFAMPIN AND  GENTAMICIN SHOULD NOT BE USED AS SINGLE DRUGS FOR TREATMENT OF STAPH INFECTIONS.     Performed at [facility]   Report Status 12/12/2012 FINAL   Final   Organism ID, Bacteria STAPHYLOCOCCUS AUREUS   Final  CULTURE, BLOOD (ROUTINE X 2)     Status: None   Collection  Time    12/11/12  1:40 AM      Result Value Range Status   Specimen Description BLOOD RIGHT ARM   Final   Special Requests BOTTLES DRAWN AEROBIC AND ANAEROBIC 10CC EACH   Final   Culture  Setup Time     Final   Value: 12/11/2012 08:34     Performed at [facility]   Culture     Final   Value:        BLOOD CULTURE RECEIVED NO GROWTH TO DATE CULTURE WILL BE HELD FOR 5 DAYS BEFORE ISSUING A FINAL NEGATIVE REPORT     Performed at [facility]   Report Status PENDING   Incomplete  CULTURE, BLOOD (ROUTINE X 2)     Status: None   Collection Time    12/11/12  1:45 AM      Result Value Range Status   Specimen Description BLOOD RIGHT HAND   Final   Special Requests BOTTLES DRAWN AEROBIC ONLY 10CC   Final   Culture  Setup Time     Final   Value: 12/11/2012 08:36     Performed at [facility]   Culture     Final   Value:        BLOOD CULTURE RECEIVED NO GROWTH TO DATE CULTURE WILL BE HELD FOR 5 DAYS BEFORE ISSUING A FINAL NEGATIVE REPORT     Performed at [facility]   Report Status PENDING   Incomplete    Studies/Results: No results found.  Assessment/Plan: 1) Prosthetic joint infection- Now MSSA.  Will continue with cefazolin and rifampin for at least 6 weeks in the IV.  Will need prolonged oral therapy after that. Repeat cultures remain negative.  TTE without vegetation.  Will defer TEE since she will need a prolonged course regardless.    -picc on Wed ok if repeat blood cultures remain negative -? If further surgery.    Will add routine HIV screening   COMER, ROBERT, MD [facility] [email or web address] [phone] pager     [phone] cell 12/12/2012, 10:48 AM

## 2012-12-12 NOTE — Progress Notes (Signed)
Physical Therapy Treatment Patient Details Name: [name] [name] MRN: [id number] DOB: 1951-01-10 Today's Date: 12/12/2012 Time: 4098-1191 PT Time Calculation (min): 24 min  PT Assessment / Plan / Recommendation  History of Present Illness s/p I&D septic Rt TKA    PT Comments   Pt cont's to be limited by pain & decreased activity tolerance.    Follow Up Recommendations  SNF;Supervision/Assistance - 24 hour     Does the patient have the potential to tolerate intense rehabilitation     Barriers to Discharge        Equipment Recommendations  None recommended by PT    Recommendations for Other Services    Frequency Min 6X/week   Progress towards PT Goals Progress towards PT goals: Progressing toward goals (slowly)  Plan Current plan remains appropriate    Precautions / Restrictions Precautions Precautions: Fall;Knee Restrictions RLE Weight Bearing: Weight bearing as tolerated   Pertinent Vitals/Pain 5/10 Rt knee.  Premedicated per pt.  Repositioned for comfort.      Mobility  Bed Mobility Bed Mobility: Supine to Sit;Sitting - Scoot to Edge of Bed Supine to Sit: 4: Min assist;HOB elevated;With rails Sitting - Scoot to Edge of Bed: 3: Mod assist Details for Bed Mobility Assistance: Incr time.  Relied heavily on rail with UE's to push trunk to sitting upright.  (A) to move RLE to EOB.  Use of draw pad to bring Rt hip forwards to EOB.   Transfers Transfers: Sit to Stand;Stand to Sit Sit to Stand: 1: +2 Total assist;4: Min guard;With upper extremity assist;With armrests;From bed;From chair/3-in-1 Sit to Stand: Patient Percentage: 70% Stand to Sit: 4: Min guard;With upper extremity assist;With armrests;To chair/3-in-1 Details for Transfer Assistance: +2 (A) to stand from bed; +1 to stand from 3-in-1 with armrests.  slow to achieve full standing Ambulation/Gait Ambulation/Gait Assistance: 4: Min guard Ambulation Distance (Feet): 20 Feet (to/from bathroom) Assistive device:  Rolling walker Ambulation/Gait Assistance Details: Cues for sequencing & encouragement to shift increased weight to Rt to allow increased step with Lt.    Pt fatigues quickly.  Step-to gait but shuffles Lt foot across floor when advancing.   Gait Pattern: Step-to pattern;Shuffle;Decreased weight shift to right;Decreased step length - right;Decreased step length - left Gait velocity: very decreased due to pain and fatigue  Stairs: No Wheelchair Mobility Wheelchair Mobility: No      PT Goals (current goals can now be found in the care plan section) Acute Rehab PT Goals PT Goal Formulation: With patient Time For Goal Achievement: 12/17/12 Potential to Achieve Goals: Good  Visit Information  Last PT Received On: 12/12/12 Assistance Needed: +2 (to stand from bed; +1 to stand from 3-in-1) History of Present Illness: s/p I&D septic Rt TKA     Subjective Data      Cognition  Cognition Arousal/Alertness: Awake/alert Behavior During Therapy: WFL for tasks assessed/performed Overall Cognitive Status: Within Functional Limits for tasks assessed    Balance     End of Session PT - End of Session Equipment Utilized During Treatment: Gait belt Activity Tolerance: Patient limited by fatigue;Patient limited by pain Patient left: in chair;with call bell/phone within reach Nurse Communication: Mobility status   GP     Lara Mulch 12/12/2012, 11:38 AM  Verdell Face, PTA [phone] 12/12/2012

## 2012-12-12 NOTE — Progress Notes (Signed)
*  PRELIMINARY RESULTS* Vascular Ultrasound Right lower extremity venous duplex has been completed.  Preliminary findings: no evidence of DVT.    Farrel Demark, RDMS, RVT  12/12/2012, 1:02 PM

## 2012-12-12 NOTE — Progress Notes (Signed)
Patient ID: [name] [name], female   DOB: 1950-03-14, 62 y.o.   MRN: [id number]  PROGRESS NOTE  Subjective:  negative for Chest Pain  negative for Shortness of Breath  negative for Nausea/Vomiting   positive for Calf Pain RLE  negative for Bowel Movement   Tolerating Diet: yes         Patient reports Right knee pain as 5 on 0-10 scale.     Objective: Vital signs in last 24 hours:   Patient Vitals for the past 24 hrs:  BP Temp Temp src Pulse Resp SpO2  12/11/12 2000 163/64 mmHg 100.4 F (38 C) Oral 110 18 93 %  12/11/12 1421 136/65 mmHg 100 F (37.8 C) - 105 20 95 %      Intake/Output from previous day:   10/27 0701 - 10/28 0700 In: 2210 [P.O.:960] Out: 35 [Drains:35]   Intake/Output this shift:       Intake/Output     10/27 0701 - 10/28 0700 10/28 0701 - 10/29 0700   P.O. 960    I.V. (mL/kg)     IV Piggyback 1250    Total Intake(mL/kg) 2210 (15.4)    Urine (mL/kg/hr)     Drains 35 (0)    Total Output 35     Net +2175          Urine Occurrence 9 x       LABORATORY DATA:  Recent Labs  12/09/12 0415 12/09/12 2140 12/11/12 0140  WBC 17.1* 17.4* 12.6*  HGB 13.6 12.9 12.1  HCT 41.5 39.3 37.9  PLT 290 241 268    Recent Labs  12/09/12 0415 12/09/12 2140 12/10/12 0605 12/11/12 0140  NA 135  --  139 140  K 4.1  --  4.3 4.4  CL 99  --  106 108  CO2 25  --  23 20  BUN 15  --  10 8  CREATININE 0.72 0.70 0.74 0.73  GLUCOSE 163*  --  149* 148*  CALCIUM 9.5  --  8.9 9.6   Lab Results  Component Value Date   INR 1.00 12/09/2012   INR 1.35 08/01/2010   INR 1.29 07/31/2010    Examination:  General appearance: alert, cooperative and no distress  Wound Exam: clean, dry, intact, dressing changed and hemovac removed. No significant warmth or erythema. No effusion.   Drainage:  None: wound tissue dry  Motor Exam: grossly intact bilateral LE  Sensory Exam: grossly intact bilateral LE  Vascular Exam: Normal  Right calf significantly tender to  palpation. No pain Left calf  Assessment:    3 Days Post-Op  Procedure(s) (LRB): ARTHROSCOPIC IRRIGATION AND DEBRIDEMENT RIGHT KNEE (Right)  ADDITIONAL DIAGNOSIS:  Principal Problem:   Septic arthritis of knee, right Active Problems:   UTI (urinary tract infection)   Hyperlipidemia   Hypertension   Plan: POD/HD#: 2  62 y/o female with septic R TKA   1. Septic R TKA POD 2 arthroscopic I&D  Activity as tolerated  Continue PT WBAT Dressing changed and hemovac removed. No effusion or erythema. No significant warmth.  Await cx   2. UTI  Urine cx shows no growth to date   3. ID   Defer abx to ID, appreciate eval  4. FEN  Diet as tolerated   5. DVT/PE prophylaxis  Lovenox   6. Hospitalist service on board to help with medical management Appreciate eval  7. Right calf pain- RLE venous duplex ordered to r/o RLE DVT  8. Dispo  Await  cultures  TEE  results pending  Dr. Eulah Pont to determine definitive ortho course.  Hopefully the pt will improve with IV antibiotics and I&D. Will consider poly exchange vs. Rev TKA if necessary.  Continue to monitor           Wilkie Aye 12/12/2012, 7:59 AM

## 2012-12-13 NOTE — Progress Notes (Signed)
Patient ID: [name] [name], female   DOB: April 04, 1950, 62 y.o.   MRN: [id number]  PROGRESS NOTE  Subjective:  negative for Chest Pain  negative for Shortness of Breath  negative for Nausea/Vomiting   positive for Bowel Movement   Tolerating Diet: yes         Patient reports pain as 5 on 0-10 scale.     Pt doing well. States she is feeling okay.   Objective: Vital signs in last 24 hours:   Patient Vitals for the past 24 hrs:  BP Temp Temp src Pulse Resp SpO2  12/13/12 0609 159/56 mmHg 98.9 F (37.2 C) Oral 104 16 94 %  12/12/12 2055 165/68 mmHg 99.6 F (37.6 C) Oral 105 16 95 %  12/12/12 1816 - 98.3 F (36.8 C) - - - -  12/12/12 1400 107/70 mmHg 100.9 F (38.3 C) - 102 16 98 %      Intake/Output from previous day:   10/28 0701 - 10/29 0700 In: 1110 [P.O.:960] Out: -    Intake/Output this shift:       Intake/Output     10/28 0701 - 10/29 0700 10/29 0701 - 10/30 0700   P.O. 960    Other 100    IV Piggyback 50    Total Intake(mL/kg) 1110 (7.7)    Drains     Total Output       Net +1110          Urine Occurrence 9 x    Stool Occurrence 2 x       LABORATORY DATA:  Recent Labs  12/09/12 0415 12/09/12 2140 12/11/12 0140  WBC 17.1* 17.4* 12.6*  HGB 13.6 12.9 12.1  HCT 41.5 39.3 37.9  PLT 290 241 268    Recent Labs  12/09/12 0415 12/09/12 2140 12/10/12 0605 12/11/12 0140  NA 135  --  139 140  K 4.1  --  4.3 4.4  CL 99  --  106 108  CO2 25  --  23 20  BUN 15  --  10 8  CREATININE 0.72 0.70 0.74 0.73  GLUCOSE 163*  --  149* 148*  CALCIUM 9.5  --  8.9 9.6   Lab Results  Component Value Date   INR 1.00 12/09/2012   INR 1.35 08/01/2010   INR 1.29 07/31/2010    Examination:  General appearance: alert, cooperative and no distress  Wound Exam: dressing in place  Drainage: no drainage noted through dressing  Motor Exam: grossly intact bilateral LE  Sensory Exam: grossly intact bilateral LE  Vascular Exam: Normal  Assessment:    4 Days  Post-Op  Procedure(s) (LRB): ARTHROSCOPIC IRRIGATION AND DEBRIDEMENT RIGHT KNEE (Right)  ADDITIONAL DIAGNOSIS:  Principal Problem:   Septic arthritis of knee, right Active Problems:   UTI (urinary tract infection)   Hyperlipidemia   Hypertension     Plan: POD/HD#: 77 62 y/o female with septic R TKA   1. Septic R TKA POD 4 arthroscopic I&D  Activity as tolerated  Continue PT WBAT  Dressing change tomorrow.  Cultures positive for methicillin sensitive Staphylococcus aureus. Infectious disease following, recommending 6 week treatment with IV Cefazolin and rifampin. Repeat blood cultures performed on 12/11/2012 showing no growth  2. UTI  Urine cx shows no growth  3. ID  Defer abx to ID, appreciate eval   4. FEN  Diet as tolerated   5. DVT/PE prophylaxis  Lovenox   6. Hospitalist service on board to help with medical management  Appreciate eval   7. Right calf pain- Venous duplex prelim no evidence of DVT   8. Dispo  Await final cultures  Dr. Eulah Pont to determine definitive ortho course. Optimistic at this point that the pt will not require additional surgical intervention. Will consider poly exchange vs. Rev TKA if necessary.  Continue to monitor           Wilkie Aye 12/13/2012, 7:55 AM

## 2012-12-13 NOTE — Progress Notes (Signed)
[facility]  Date of Admission:  12/09/2012  Antibiotics: Antibiotics Given (last 72 hours)   Date/Time Action Medication Dose Rate   12/10/12 1031 Given   gentamicin (GARAMYCIN) 600 mg in dextrose 5 % 100 mL IVPB 600 mg 115 mL/hr   12/10/12 1400 Given   vancomycin (VANCOCIN) 1,500 mg in sodium chloride 0.9 % 500 mL IVPB 1,500 mg 250 mL/hr   12/10/12 2231 Given  [med not on unit]   ceFAZolin (ANCEF) IVPB 2 g/50 mL premix 2 g 100 mL/hr   12/11/12 0444 Given   ceFAZolin (ANCEF) IVPB 2 g/50 mL premix 2 g 100 mL/hr   12/11/12 1346 Given   ceFAZolin (ANCEF) IVPB 2 g/50 mL premix 2 g 100 mL/hr   12/11/12 1355 Given   rifampin (RIFADIN) capsule 300 mg 300 mg    12/11/12 1742 Given   vancomycin (VANCOCIN) 1,500 mg in sodium chloride 0.9 % 500 mL IVPB 1,500 mg 250 mL/hr   12/11/12 1951 Given   ceFAZolin (ANCEF) IVPB 2 g/50 mL premix 2 g 100 mL/hr   12/11/12 2122 Given   rifampin (RIFADIN) capsule 300 mg 300 mg    12/12/12 0355 Given   ceFAZolin (ANCEF) IVPB 2 g/50 mL premix 2 g 100 mL/hr   12/12/12 0510 Given   vancomycin (VANCOCIN) 1,500 mg in sodium chloride 0.9 % 500 mL IVPB 1,500 mg 250 mL/hr   12/12/12 1145 Given   rifampin (RIFADIN) capsule 300 mg 300 mg    12/12/12 1604 Given   ceFAZolin (ANCEF) IVPB 2 g/50 mL premix 2 g 100 mL/hr   12/12/12 2249 Given   rifampin (RIFADIN) capsule 300 mg 300 mg    12/12/12 2323 Given   ceFAZolin (ANCEF) IVPB 2 g/50 mL premix 2 g 100 mL/hr   12/13/12 0810 Given   ceFAZolin (ANCEF) IVPB 2 g/50 mL premix 2 g 100 mL/hr      Subjective: No complaints  Objective: Temp:  [98.3 F (36.8 C)-100.9 F (38.3 C)] 98.9 F (37.2 C) (10/29 0609) Pulse Rate:  [102-105] 104 (10/29 0609) Resp:  [16] 16 (10/29 0609) BP: (107-165)/(56-70) 159/56 mmHg (10/29 0609) SpO2:  [94 %-98 %] 94 % (10/29 0609)  General: Awake, alert, nad Skin: no rashes Lungs: CTA B Cor: RRR Abdomen: soft, obese, nt, nd Ext: right leg  wrapped  Lab Results Lab Results  Component Value Date   WBC 12.6* 12/11/2012   HGB 12.1 12/11/2012   HCT 37.9 12/11/2012   MCV 87.3 12/11/2012   PLT 268 12/11/2012    Lab Results  Component Value Date   CREATININE 0.73 12/11/2012   BUN 8 12/11/2012   NA 140 12/11/2012   K 4.4 12/11/2012   CL 108 12/11/2012   CO2 20 12/11/2012    Lab Results  Component Value Date   ALT 20 07/21/2010   AST 19 07/21/2010   ALKPHOS 91 07/21/2010   BILITOT 0.3 07/21/2010      Microbiology: Recent Results (from the past 240 hour(s))  CULTURE, BLOOD (ROUTINE X 2)     Status: None   Collection Time    12/09/12  4:15 AM      Result Value Range Status   Specimen Description BLOOD RIGHT ARM   Final   Special Requests BOTTLES DRAWN AEROBIC AND ANAEROBIC 5CC   Final   Culture  Setup Time     Final   Value: 12/09/2012 13:17     Performed at [facility]  Culture     Final   Value: STAPHYLOCOCCUS AUREUS     Note: RIFAMPIN AND GENTAMICIN SHOULD NOT BE USED AS SINGLE DRUGS FOR TREATMENT OF STAPH INFECTIONS.     Note: Gram Stain Report Called to,Read Back By and Verified With: Illene Regulus RN on 12/10/12 at 06:28 by Christie Nottingham     Performed at [facility]   Report Status 12/12/2012 FINAL   Final   Organism ID, Bacteria STAPHYLOCOCCUS AUREUS   Final  CULTURE, BLOOD (ROUTINE X 2)     Status: None   Collection Time    12/09/12  4:48 AM      Result Value Range Status   Specimen Description BLOOD LEFT ARM   Final   Special Requests BOTTLES DRAWN AEROBIC AND ANAEROBIC 3CC   Final   Culture  Setup Time     Final   Value: 12/09/2012 13:17     Performed at [facility]   Culture     Final   Value: STAPHYLOCOCCUS AUREUS     Note: SUSCEPTIBILITIES PERFORMED ON PREVIOUS CULTURE WITHIN THE LAST 5 DAYS.     Note: Gram Stain Report Called to,Read Back By and Verified With: Illene Regulus RN on 12/10/12 at 03:25 by Christie Nottingham     Performed at [facility]   Report Status  12/12/2012 FINAL   Final  BODY FLUID CULTURE     Status: None   Collection Time    12/09/12  5:56 AM      Result Value Range Status   Specimen Description KNEE   Final   Special Requests Normal   Final   Gram Stain     Final   Value: CYTOSPIN WBC PRESENT, PREDOMINANTLY PMN     NO ORGANISMS SEEN     Performed by [facility]     Performed at [facility]   Culture     Final   Value: RARE STAPHYLOCOCCUS AUREUS     Note: CRITICAL RESULT CALLED TO, READ BACK BY AND VERIFIED WITH: SHARON W @ 1610 12/10/12 BY KRAWS RIFAMPIN AND GENTAMICIN SHOULD NOT BE USED AS SINGLE DRUGS FOR TREATMENT OF STAPH INFECTIONS.     Performed at [facility]   Report Status 12/12/2012 FINAL   Final   Organism ID, Bacteria STAPHYLOCOCCUS AUREUS   Final  GRAM STAIN     Status: None   Collection Time    12/09/12  5:56 AM      Result Value Range Status   Specimen Description KNEE   Final   Special Requests Normal   Final   Gram Stain     Final   Value: CYTOSPIN     WBC PRESENT, PREDOMINANTLY PMN     NO ORGANISMS SEEN     Gram Stain Report Called to,Read Back By and Verified With: T.SMITH RN AT 0820 ON 25OCT14 BY C.BONGEL   Report Status 12/09/2012 FINAL   Final  URINE CULTURE     Status: None   Collection Time    12/09/12 12:03 PM      Result Value Range Status   Specimen Description URINE, CATHETERIZED   Final   Special Requests NONE   Final   Culture  Setup Time     Final   Value: 12/09/2012 19:31     Performed at [facility]     Final   Value: NO GROWTH     Performed at [facility]  Culture     Final   Value: NO GROWTH     Performed at [facility]   Report Status 12/10/2012 FINAL   Final  MRSA PCR SCREENING     Status: None   Collection Time    12/09/12  1:37 PM      Result Value Range Status   MRSA by PCR NEGATIVE  NEGATIVE Final   Comment:            The GeneXpert MRSA Assay (FDA     approved for NASAL specimens     only),  is one component of a     comprehensive MRSA colonization     surveillance program. It is not     intended to diagnose MRSA     infection nor to guide or     monitor treatment for     MRSA infections.  GRAM STAIN     Status: None   Collection Time    12/09/12  5:46 PM      Result Value Range Status   Specimen Description FLUID SYNOVIAL RIGHT KNEE   Final   Special Requests PATIENT ON FOLLOWING ANCEF FLUID ON SWAB   Final   Gram Stain     Final   Value: FEW WBC PRESENT,BOTH PMN AND MONONUCLEAR     NO ORGANISMS SEEN     Gram Stain Report Called to,Read Back By and Verified With:     HANDY M,MD 1835 12/09/12 SCALES H   Report Status 12/09/2012 FINAL   Final  ANAEROBIC CULTURE     Status: None   Collection Time    12/09/12  5:46 PM      Result Value Range Status   Specimen Description FLUID SYNOVIAL RIGHT KNEE   Final   Special Requests PATIENT ON FOLLOWING ANCEF FLUID ON SWAB   Final   Gram Stain     Final   Value: FEW WBC PRESENT,BOTH PMN AND MONONUCLEAR     NO ORGANISMS SEEN     Gram Stain Report Called to,Read Back By and Verified With: Gram Stain Report Called to,Read Back By and Verified With: HANDY M MD 1835 12/09/12 SCALES H Performed at [facility]     Performed at [facility]   Culture     Final   Value: NO ANAEROBES ISOLATED; CULTURE IN PROGRESS FOR 5 DAYS     Performed at [facility]   Report Status PENDING   Incomplete  BODY FLUID CULTURE     Status: None   Collection Time    12/09/12  5:46 PM      Result Value Range Status   Specimen Description FLUID SYNOVIAL RIGHT KNEE   Final   Special Requests PATIENT ON FOLLOWING ANCEF FLUID ON SWAB   Final   Gram Stain     Final   Value: FEW WBC PRESENT,BOTH PMN AND MONONUCLEAR     NO ORGANISMS SEEN     Gram Stain Report Called to,Read Back By and Verified With: Gram Stain Report Called to,Read Back By and Verified With: HANDY M MD 1835 12/09/12 SCALES H Performed at [facility]      Performed at [facility]   Culture     Final   Value: FEW STAPHYLOCOCCUS AUREUS     Note: RIFAMPIN AND GENTAMICIN SHOULD NOT BE USED AS SINGLE DRUGS FOR TREATMENT OF STAPH INFECTIONS.     Performed at [facility]   Report Status 12/12/2012 FINAL  Final   Organism ID, Bacteria STAPHYLOCOCCUS AUREUS   Final  CULTURE, BLOOD (ROUTINE X 2)     Status: None   Collection Time    12/11/12  1:40 AM      Result Value Range Status   Specimen Description BLOOD RIGHT ARM   Final   Special Requests BOTTLES DRAWN AEROBIC AND ANAEROBIC 10CC EACH   Final   Culture  Setup Time     Final   Value: 12/11/2012 08:34     Performed at [facility]   Culture     Final   Value:        BLOOD CULTURE RECEIVED NO GROWTH TO DATE CULTURE WILL BE HELD FOR 5 DAYS BEFORE ISSUING A FINAL NEGATIVE REPORT     Performed at [facility]   Report Status PENDING   Incomplete  CULTURE, BLOOD (ROUTINE X 2)     Status: None   Collection Time    12/11/12  1:45 AM      Result Value Range Status   Specimen Description BLOOD RIGHT HAND   Final   Special Requests BOTTLES DRAWN AEROBIC ONLY 10CC   Final   Culture  Setup Time     Final   Value: 12/11/2012 08:36     Performed at [facility]   Culture     Final   Value:        BLOOD CULTURE RECEIVED NO GROWTH TO DATE CULTURE WILL BE HELD FOR 5 DAYS BEFORE ISSUING A FINAL NEGATIVE REPORT     Performed at [facility]   Report Status PENDING   Incomplete    Studies/Results: No results found.  Assessment/Plan: 1) Prosthetic joint infection- Now MSSA.  Will continue with cefazolin and rifampin for at least 6 weeks in the IV through December 5th.  Will need prolonged oral therapy after that (6 months). Repeat cultures remain negative.  TTE without vegetation.  Will defer TEE since she will need a prolonged course regardless.    -picc in -we will follow up with her in about 2 weeks -continue cefazolin 2 g q 8 hours and rifampin 300  mg po twice a day per home health protocol -cbc, cmp weekly to [facility]  Thanks for consult    Staci Righter, MD [facility] [email or web address] [phone] pager   [phone] cell 12/13/2012, 10:03 AM

## 2012-12-13 NOTE — Progress Notes (Signed)
Physical Therapy Treatment Patient Details Name: [name] [name] MRN: [id number] DOB: January 08, 1951 Today's Date: 12/13/2012 Time: 4098-1191 PT Time Calculation (min): 23 min  PT Assessment / Plan / Recommendation  History of Present Illness s/p I&D septic Rt TKA    PT Comments   Pt agreeable to participate in therapy.  Progressing with ability to stand with decreased (A) but mobility cont's to be limited by decreased activity tolerance & pt reporting Rt knee discomfort.     Follow Up Recommendations  SNF;Supervision/Assistance - 24 hour     Does the patient have the potential to tolerate intense rehabilitation     Barriers to Discharge        Equipment Recommendations  None recommended by PT    Recommendations for Other Services    Frequency Min 6X/week   Progress towards PT Goals Progress towards PT goals: Progressing toward goals (slowly)  Plan Current plan remains appropriate    Precautions / Restrictions Precautions Precautions: Fall;Knee Restrictions Weight Bearing Restrictions: Yes RLE Weight Bearing: Weight bearing as tolerated   Pertinent Vitals/Pain 5/10 Rt knee pain.  Appeared comfortable with repositioning.      Mobility  Bed Mobility Bed Mobility: Supine to Sit;Sitting - Scoot to Edge of Bed Supine to Sit: 4: Min guard;With rails Sitting - Scoot to Edge of Bed: 4: Min guard Details for Bed Mobility Assistance: Incr time & relies heavily on rail to push herself up to sitting.   Transfers Transfers: Sit to Stand;Stand to Sit Sit to Stand: 4: Min guard;With upper extremity assist;From bed Stand to Sit: 4: Min guard;With upper extremity assist;With armrests;To chair/3-in-1 Details for Transfer Assistance: Incr time to stand Ambulation/Gait Ambulation/Gait Assistance: 4: Min guard Ambulation Distance (Feet): 15 Feet Assistive device: Rolling walker Ambulation/Gait Assistance Details: Distance limited due to fatigue & Rt knee pain.  Cues for sequencing.   Multiple standing rest breaks due to UE fatigue.  Encouraged pt to decrease reliance of UE's on RW.  Lt step length less than Rt step length.   Gait Pattern: Step-to pattern;Shuffle;Decreased weight shift to right;Decreased step length - right;Decreased step length - left Gait velocity: very decreased due to pain and fatigue  Stairs: No Wheelchair Mobility Wheelchair Mobility: No    Exercises General Exercises - Lower Extremity Ankle Circles/Pumps: AROM;Both;10 reps Quad Sets: AROM;Right;5 reps Long Arc Quad: AROM;Right;10 reps Heel Slides: AAROM;Right;10 reps;Supine Straight Leg Raises: AAROM;Right;10 reps;Supine     PT Goals (current goals can now be found in the care plan section) Acute Rehab PT Goals PT Goal Formulation: With patient Time For Goal Achievement: 12/17/12 Potential to Achieve Goals: Good  Visit Information  Last PT Received On: 12/13/12 Assistance Needed: +1 History of Present Illness: s/p I&D septic Rt TKA     Subjective Data      Cognition  Cognition Arousal/Alertness: Awake/alert Behavior During Therapy: WFL for tasks assessed/performed Overall Cognitive Status: Within Functional Limits for tasks assessed    Balance     End of Session PT - End of Session Activity Tolerance: Patient limited by fatigue Patient left: in chair;with call bell/phone within reach Nurse Communication: Mobility status   GP     Lara Mulch 12/13/2012, 2:10 PM   Verdell Face, PTA [phone] 12/13/2012

## 2012-12-13 NOTE — Progress Notes (Signed)
[facility] PROGRESS NOTE  [name] [name] ZOX:[id number] DOB: Mar 11, 1950 DOA: 12/09/2012 PCP: Lupita Raider, MD  Assessment/Plan: Septic arthritis of the right knee  -Cultures positive for methicillin sensitive Staphylococcus aureus. Infectious disease following, recommending 6 week treatment with IV Cefazolin and rifampin. Repeat blood cultures performed on 12/11/2012 showing no growth.  Urinary tract infection  -Urine cultures drawn on 12/09/2012 showing no growth. Patient on empiric IV antibiotic therapy for septic arthritis Hyperlipidemia  - Continue atorvastatin  Hypertension  BP high. Likely pain related. Will decrease IVF to reduce salt load.   Code Status: Full code Disposition Plan: Patient will require 6 weeks of IV antibiotic therapy   Procedures:  Transthoracic echocardiogram performed on 12/11/2012 impression: Estimated ejection fraction 65-70%, no wall motion abnormalities, grade 2 diastolic dysfunction  Antibiotics:  Cefazolin  Rifampin  HPI/Subjective: C/o pain in knee and right shoulder (chronic)  Objective: Filed Vitals:   12/13/12 1245  BP: 167/79  Pulse: 97  Temp: 99.3 F (37.4 C)  Resp: 16    Intake/Output Summary (Last 24 hours) at 12/13/12 1718 Last data filed at 12/13/12 8119  Gross per 24 hour  Intake    630 ml  Output      0 ml  Net    630 ml   Filed Weights   12/09/12 0900 12/09/12 1300  Weight: 143.79 kg (317 lb) 143.6 kg (316 lb 9.3 oz)    Exam:   General:  No acute distress, she is pleasant cooperative and nontoxic-appearing  Cardiovascular: Regular rate rhythm normal S1-S2  Respiratory: Clear to auscultation bilaterally  Abdomen: Soft nontender nondistended  Musculoskeletal: There is some tenderness to palpation over her right knee. I do not no erythema or worsening swelling  Data Reviewed: Basic Metabolic Panel:  Recent Labs Lab 12/09/12 0415 12/09/12 2140 12/10/12 0605 12/11/12 0140  NA 135  --  139  140  K 4.1  --  4.3 4.4  CL 99  --  106 108  CO2 25  --  23 20  GLUCOSE 163*  --  149* 148*  BUN 15  --  10 8  CREATININE 0.72 0.70 0.74 0.73  CALCIUM 9.5  --  8.9 9.6  PHOS  --   --   --  2.7   Liver Function Tests:  Recent Labs Lab 12/11/12 0140  ALBUMIN 2.7*   No results found for this basename: LIPASE, AMYLASE,  in the last 168 hours No results found for this basename: AMMONIA,  in the last 168 hours CBC:  Recent Labs Lab 12/09/12 0415 12/09/12 2140 12/11/12 0140  WBC 17.1* 17.4* 12.6*  NEUTROABS 13.9*  --  8.3*  HGB 13.6 12.9 12.1  HCT 41.5 39.3 37.9  MCV 85.2 86.6 87.3  PLT 290 241 268   Cardiac Enzymes: No results found for this basename: CKTOTAL, CKMB, CKMBINDEX, TROPONINI,  in the last 168 hours BNP (last 3 results) No results found for this basename: PROBNP,  in the last 8760 hours CBG:  Recent Labs Lab 12/09/12 0557 12/09/12 1342  GLUCAP 155* 109*    Recent Results (from the past 240 hour(s))  CULTURE, BLOOD (ROUTINE X 2)     Status: None   Collection Time    12/09/12  4:15 AM      Result Value Range Status   Specimen Description BLOOD RIGHT ARM   Final   Special Requests BOTTLES DRAWN AEROBIC AND ANAEROBIC 5CC   Final   Culture  Setup Time     Final  Value: 12/09/2012 13:17     Performed at [facility]   Culture     Final   Value: STAPHYLOCOCCUS AUREUS     Note: RIFAMPIN AND GENTAMICIN SHOULD NOT BE USED AS SINGLE DRUGS FOR TREATMENT OF STAPH INFECTIONS.     Note: Gram Stain Report Called to,Read Back By and Verified With: Illene Regulus RN on 12/10/12 at 06:28 by Christie Nottingham     Performed at [facility]   Report Status 12/12/2012 FINAL   Final   Organism ID, Bacteria STAPHYLOCOCCUS AUREUS   Final  CULTURE, BLOOD (ROUTINE X 2)     Status: None   Collection Time    12/09/12  4:48 AM      Result Value Range Status   Specimen Description BLOOD LEFT ARM   Final   Special Requests BOTTLES DRAWN AEROBIC AND ANAEROBIC 3CC    Final   Culture  Setup Time     Final   Value: 12/09/2012 13:17     Performed at [facility]   Culture     Final   Value: STAPHYLOCOCCUS AUREUS     Note: SUSCEPTIBILITIES PERFORMED ON PREVIOUS CULTURE WITHIN THE LAST 5 DAYS.     Note: Gram Stain Report Called to,Read Back By and Verified With: Illene Regulus RN on 12/10/12 at 03:25 by Christie Nottingham     Performed at [facility]   Report Status 12/12/2012 FINAL   Final  BODY FLUID CULTURE     Status: None   Collection Time    12/09/12  5:56 AM      Result Value Range Status   Specimen Description KNEE   Final   Special Requests Normal   Final   Gram Stain     Final   Value: CYTOSPIN WBC PRESENT, PREDOMINANTLY PMN     NO ORGANISMS SEEN     Performed by [facility]     Performed at [facility]   Culture     Final   Value: RARE STAPHYLOCOCCUS AUREUS     Note: CRITICAL RESULT CALLED TO, READ BACK BY AND VERIFIED WITH: SHARON W @ 0454 12/10/12 BY KRAWS RIFAMPIN AND GENTAMICIN SHOULD NOT BE USED AS SINGLE DRUGS FOR TREATMENT OF STAPH INFECTIONS.     Performed at [facility]   Report Status 12/12/2012 FINAL   Final   Organism ID, Bacteria STAPHYLOCOCCUS AUREUS   Final  GRAM STAIN     Status: None   Collection Time    12/09/12  5:56 AM      Result Value Range Status   Specimen Description KNEE   Final   Special Requests Normal   Final   Gram Stain     Final   Value: CYTOSPIN     WBC PRESENT, PREDOMINANTLY PMN     NO ORGANISMS SEEN     Gram Stain Report Called to,Read Back By and Verified With: T.SMITH RN AT 0820 ON 25OCT14 BY C.BONGEL   Report Status 12/09/2012 FINAL   Final  URINE CULTURE     Status: None   Collection Time    12/09/12 12:03 PM      Result Value Range Status   Specimen Description URINE, CATHETERIZED   Final   Special Requests NONE   Final   Culture  Setup Time     Final   Value: 12/09/2012 19:31     Performed at [facility]     Final  Value:  NO GROWTH     Performed at [facility]     Final   Value: NO GROWTH     Performed at [facility]   Report Status 12/10/2012 FINAL   Final  MRSA PCR SCREENING     Status: None   Collection Time    12/09/12  1:37 PM      Result Value Range Status   MRSA by PCR NEGATIVE  NEGATIVE Final   Comment:            The GeneXpert MRSA Assay (FDA     approved for NASAL specimens     only), is one component of a     comprehensive MRSA colonization     surveillance program. It is not     intended to diagnose MRSA     infection nor to guide or     monitor treatment for     MRSA infections.  GRAM STAIN     Status: None   Collection Time    12/09/12  5:46 PM      Result Value Range Status   Specimen Description FLUID SYNOVIAL RIGHT KNEE   Final   Special Requests PATIENT ON FOLLOWING ANCEF FLUID ON SWAB   Final   Gram Stain     Final   Value: FEW WBC PRESENT,BOTH PMN AND MONONUCLEAR     NO ORGANISMS SEEN     Gram Stain Report Called to,Read Back By and Verified With:     HANDY M,MD 1835 12/09/12 SCALES H   Report Status 12/09/2012 FINAL   Final  ANAEROBIC CULTURE     Status: None   Collection Time    12/09/12  5:46 PM      Result Value Range Status   Specimen Description FLUID SYNOVIAL RIGHT KNEE   Final   Special Requests PATIENT ON FOLLOWING ANCEF FLUID ON SWAB   Final   Gram Stain     Final   Value: FEW WBC PRESENT,BOTH PMN AND MONONUCLEAR     NO ORGANISMS SEEN     Gram Stain Report Called to,Read Back By and Verified With: Gram Stain Report Called to,Read Back By and Verified With: HANDY M MD 1835 12/09/12 SCALES H Performed at [facility]     Performed at [facility]   Culture     Final   Value: NO ANAEROBES ISOLATED; CULTURE IN PROGRESS FOR 5 DAYS     Performed at [facility]   Report Status PENDING   Incomplete  BODY FLUID CULTURE     Status: None   Collection Time    12/09/12  5:46 PM      Result Value Range Status    Specimen Description FLUID SYNOVIAL RIGHT KNEE   Final   Special Requests PATIENT ON FOLLOWING ANCEF FLUID ON SWAB   Final   Gram Stain     Final   Value: FEW WBC PRESENT,BOTH PMN AND MONONUCLEAR     NO ORGANISMS SEEN     Gram Stain Report Called to,Read Back By and Verified With: Gram Stain Report Called to,Read Back By and Verified With: HANDY M MD 1835 12/09/12 SCALES H Performed at [facility]     Performed at [facility]   Culture     Final   Value: FEW STAPHYLOCOCCUS AUREUS     Note: RIFAMPIN AND GENTAMICIN SHOULD NOT BE USED AS SINGLE DRUGS FOR TREATMENT OF STAPH INFECTIONS.  Performed at [facility]   Report Status 12/12/2012 FINAL   Final   Organism ID, Bacteria STAPHYLOCOCCUS AUREUS   Final  CULTURE, BLOOD (ROUTINE X 2)     Status: None   Collection Time    12/11/12  1:40 AM      Result Value Range Status   Specimen Description BLOOD RIGHT ARM   Final   Special Requests BOTTLES DRAWN AEROBIC AND ANAEROBIC 10CC EACH   Final   Culture  Setup Time     Final   Value: 12/11/2012 08:34     Performed at [facility]   Culture     Final   Value:        BLOOD CULTURE RECEIVED NO GROWTH TO DATE CULTURE WILL BE HELD FOR 5 DAYS BEFORE ISSUING A FINAL NEGATIVE REPORT     Performed at [facility]   Report Status PENDING   Incomplete  CULTURE, BLOOD (ROUTINE X 2)     Status: None   Collection Time    12/11/12  1:45 AM      Result Value Range Status   Specimen Description BLOOD RIGHT HAND   Final   Special Requests BOTTLES DRAWN AEROBIC ONLY 10CC   Final   Culture  Setup Time     Final   Value: 12/11/2012 08:36     Performed at [facility]   Culture     Final   Value:        BLOOD CULTURE RECEIVED NO GROWTH TO DATE CULTURE WILL BE HELD FOR 5 DAYS BEFORE ISSUING A FINAL NEGATIVE REPORT     Performed at [facility]   Report Status PENDING   Incomplete     Studies: No results found.  Scheduled Meds: . amLODipine  10  mg Oral Daily   And  . irbesartan  150 mg Oral Daily  . atorvastatin  40 mg Oral q1800  .  ceFAZolin (ANCEF) IV  2 g Intravenous Q8H  . docusate sodium  100 mg Oral BID  . enoxaparin (LOVENOX) injection  40 mg Subcutaneous Q24H  . nebivolol  10 mg Oral Daily  . rifampin  300 mg Oral Q12H   Continuous Infusions: . 0.9 % NaCl with KCl 20 mEq / L 10 mL/hr at 12/11/12 0500    Time spent: 25  Christiane Ha, MD  [facility] Pager [phone]. If 7PM-7AM, please contact night-coverage at [email or web address], password [facility] 12/13/2012, 5:18 PM  LOS: 4 days

## 2012-12-14 ENCOUNTER — Inpatient Hospital Stay (HOSPITAL_COMMUNITY): Payer: Medicare Other

## 2012-12-14 LAB — ANAEROBIC CULTURE

## 2012-12-14 LAB — CBC WITH DIFFERENTIAL/PLATELET
Basophils Absolute: 0 10*3/uL (ref 0.0–0.1)
Basophils Relative: 0 % (ref 0–1)
Eosinophils Relative: 1 % (ref 0–5)
HCT: 36.2 % (ref 36.0–46.0)
Lymphocytes Relative: 18 % (ref 12–46)
MCH: 28.3 pg (ref 26.0–34.0)
MCHC: 32.9 g/dL (ref 30.0–36.0)
MCV: 86.2 fL (ref 78.0–100.0)
Monocytes Absolute: 1 10*3/uL (ref 0.1–1.0)
Monocytes Relative: 8 % (ref 3–12)
Platelets: 311 10*3/uL (ref 150–400)
RDW: 14.8 % (ref 11.5–15.5)
WBC: 12.4 10*3/uL — ABNORMAL HIGH (ref 4.0–10.5)

## 2012-12-14 MED ORDER — SODIUM CHLORIDE 0.9 % IJ SOLN: 10.0000 mL | INTRAMUSCULAR | Status: DC | PRN

## 2012-12-14 NOTE — Progress Notes (Signed)
[facility] PROGRESS NOTE  [name] [name] WJX:[id number] DOB: January 02, 1951 DOA: 12/09/2012 PCP: Lupita Raider, MD  Assessment/Plan: Septic arthritis of the right knee  -Cultures positive for methicillin sensitive Staphylococcus aureus. Infectious disease following, recommending 6 week treatment with IV Cefazolin and rifampin. Repeat blood cultures performed on 12/11/2012 showing no growth.  PICC in Urinary tract infection  -Urine cultures drawn on 12/09/2012 showing no growth. Patient on empiric IV antibiotic therapy for septic arthritis Hyperlipidemia  - Continue atorvastatin  Hypertension  BP better   Code Status: Full code Disposition Plan: Patient will require 6 weeks of IV antibiotic therapy   Procedures:  Transthoracic echocardiogram performed on 12/11/2012 impression: Estimated ejection fraction 65-70%, no wall motion abnormalities, grade 2 diastolic dysfunction  Antibiotics:  Cefazolin  Rifampin  HPI/Subjective: Pain unchanged  Objective: Filed Vitals:   12/14/12 1257  BP: 136/76  Pulse: 95  Temp: 99.4 F (37.4 C)  Resp: 16    Intake/Output Summary (Last 24 hours) at 12/14/12 1322 Last data filed at 12/14/12 0616  Gross per 24 hour  Intake    290 ml  Output      0 ml  Net    290 ml   Filed Weights   12/09/12 0900 12/09/12 1300  Weight: 143.79 kg (317 lb) 143.6 kg (316 lb 9.3 oz)    Exam:   General:  comfortable  Cardiovascular: Regular rate rhythm normal S1-S2  Respiratory: Clear to auscultation bilaterally  Abdomen: Soft nontender nondistended  Musculoskeletal: There is some tenderness to palpation over her right knee. I do not no erythema or worsening swelling  Data Reviewed: Basic Metabolic Panel:  Recent Labs Lab 12/09/12 0415 12/09/12 2140 12/10/12 0605 12/11/12 0140  NA 135  --  139 140  K 4.1  --  4.3 4.4  CL 99  --  106 108  CO2 25  --  23 20  GLUCOSE 163*  --  149* 148*  BUN 15  --  10 8  CREATININE 0.72 0.70  0.74 0.73  CALCIUM 9.5  --  8.9 9.6  PHOS  --   --   --  2.7   Liver Function Tests:  Recent Labs Lab 12/11/12 0140  ALBUMIN 2.7*   No results found for this basename: LIPASE, AMYLASE,  in the last 168 hours No results found for this basename: AMMONIA,  in the last 168 hours CBC:  Recent Labs Lab 12/09/12 0415 12/09/12 2140 12/11/12 0140 12/14/12 0750  WBC 17.1* 17.4* 12.6* 12.4*  NEUTROABS 13.9*  --  8.3* 9.1*  HGB 13.6 12.9 12.1 11.9*  HCT 41.5 39.3 37.9 36.2  MCV 85.2 86.6 87.3 86.2  PLT 290 241 268 311   Cardiac Enzymes: No results found for this basename: CKTOTAL, CKMB, CKMBINDEX, TROPONINI,  in the last 168 hours BNP (last 3 results) No results found for this basename: PROBNP,  in the last 8760 hours CBG:  Recent Labs Lab 12/09/12 0557 12/09/12 1342  GLUCAP 155* 109*    Recent Results (from the past 240 hour(s))  CULTURE, BLOOD (ROUTINE X 2)     Status: None   Collection Time    12/09/12  4:15 AM      Result Value Range Status   Specimen Description BLOOD RIGHT ARM   Final   Special Requests BOTTLES DRAWN AEROBIC AND ANAEROBIC 5CC   Final   Culture  Setup Time     Final   Value: 12/09/2012 13:17     Performed at [facility]  Culture     Final   Value: STAPHYLOCOCCUS AUREUS     Note: RIFAMPIN AND GENTAMICIN SHOULD NOT BE USED AS SINGLE DRUGS FOR TREATMENT OF STAPH INFECTIONS.     Note: Gram Stain Report Called to,Read Back By and Verified With: Illene Regulus RN on 12/10/12 at 06:28 by Christie Nottingham     Performed at [facility]   Report Status 12/12/2012 FINAL   Final   Organism ID, Bacteria STAPHYLOCOCCUS AUREUS   Final  CULTURE, BLOOD (ROUTINE X 2)     Status: None   Collection Time    12/09/12  4:48 AM      Result Value Range Status   Specimen Description BLOOD LEFT ARM   Final   Special Requests BOTTLES DRAWN AEROBIC AND ANAEROBIC 3CC   Final   Culture  Setup Time     Final   Value: 12/09/2012 13:17     Performed at [facility]   Culture     Final   Value: STAPHYLOCOCCUS AUREUS     Note: SUSCEPTIBILITIES PERFORMED ON PREVIOUS CULTURE WITHIN THE LAST 5 DAYS.     Note: Gram Stain Report Called to,Read Back By and Verified With: Illene Regulus RN on 12/10/12 at 03:25 by Christie Nottingham     Performed at [facility]   Report Status 12/12/2012 FINAL   Final  BODY FLUID CULTURE     Status: None   Collection Time    12/09/12  5:56 AM      Result Value Range Status   Specimen Description KNEE   Final   Special Requests Normal   Final   Gram Stain     Final   Value: CYTOSPIN WBC PRESENT, PREDOMINANTLY PMN     NO ORGANISMS SEEN     Performed by [facility]     Performed at [facility]   Culture     Final   Value: RARE STAPHYLOCOCCUS AUREUS     Note: CRITICAL RESULT CALLED TO, READ BACK BY AND VERIFIED WITH: SHARON W @ 1610 12/10/12 BY KRAWS RIFAMPIN AND GENTAMICIN SHOULD NOT BE USED AS SINGLE DRUGS FOR TREATMENT OF STAPH INFECTIONS.     Performed at [facility]   Report Status 12/12/2012 FINAL   Final   Organism ID, Bacteria STAPHYLOCOCCUS AUREUS   Final  GRAM STAIN     Status: None   Collection Time    12/09/12  5:56 AM      Result Value Range Status   Specimen Description KNEE   Final   Special Requests Normal   Final   Gram Stain     Final   Value: CYTOSPIN     WBC PRESENT, PREDOMINANTLY PMN     NO ORGANISMS SEEN     Gram Stain Report Called to,Read Back By and Verified With: T.SMITH RN AT 0820 ON 25OCT14 BY C.BONGEL   Report Status 12/09/2012 FINAL   Final  URINE CULTURE     Status: None   Collection Time    12/09/12 12:03 PM      Result Value Range Status   Specimen Description URINE, CATHETERIZED   Final   Special Requests NONE   Final   Culture  Setup Time     Final   Value: 12/09/2012 19:31     Performed at [facility]     Final   Value: NO GROWTH     Performed at [facility]  Culture     Final   Value: NO GROWTH      Performed at [facility]   Report Status 12/10/2012 FINAL   Final  MRSA PCR SCREENING     Status: None   Collection Time    12/09/12  1:37 PM      Result Value Range Status   MRSA by PCR NEGATIVE  NEGATIVE Final   Comment:            The GeneXpert MRSA Assay (FDA     approved for NASAL specimens     only), is one component of a     comprehensive MRSA colonization     surveillance program. It is not     intended to diagnose MRSA     infection nor to guide or     monitor treatment for     MRSA infections.  GRAM STAIN     Status: None   Collection Time    12/09/12  5:46 PM      Result Value Range Status   Specimen Description FLUID SYNOVIAL RIGHT KNEE   Final   Special Requests PATIENT ON FOLLOWING ANCEF FLUID ON SWAB   Final   Gram Stain     Final   Value: FEW WBC PRESENT,BOTH PMN AND MONONUCLEAR     NO ORGANISMS SEEN     Gram Stain Report Called to,Read Back By and Verified With:     HANDY M,MD 1835 12/09/12 [facility]AL[facility]   Report Status 12/09/2012 FINAL   Final  ANAEROBIC CULTURE     Status: None   Collection Time    12/09/12  5:46 PM      Result Value Range Status   Specimen Description FLUID SYNOVIAL RIGHT KNEE   Final   Special Requests PATIENT ON FOLLOWING ANCEF FLUID ON SWAB   Final   Gram Stain     Final   Value: FEW WBC PRESENT,BOTH PMN AND MONONUCLEAR     NO ORGANISMS SEEN     Gram Stain Report Called to,Read Back By and Verified With: Gram Stain Report Called to,Read Back By and Verified With: HANDY M MD 1835 12/09/12 SCALES H Performed at [facility]     Performed at [facility]   Culture     Final   Value: NO ANAEROBES ISOLATED     Performed at [facility]   Report Status 12/14/2012 FINAL   Final  BODY FLUID CULTURE     Status: None   Collection Time    12/09/12  5:46 PM      Result Value Range Status   Specimen Description FLUID SYNOVIAL RIGHT KNEE   Final   Special Requests PATIENT ON FOLLOWING ANCEF FLUID ON SWAB   Final    Gram Stain     Final   Value: FEW WBC PRESENT,BOTH PMN AND MONONUCLEAR     NO ORGANISMS SEEN     Gram Stain Report Called to,Read Back By and Verified With: Gram Stain Report Called to,Read Back By and Verified With: HANDY M MD 1835 12/09/12 SCAL[facility] Performed at [facility]     Performed at [facility]   Culture     Final   Value: FEW STAPHYLOCOCCUS AUREUS     Note: RIFAMPIN AND GENTAMICIN SHOULD NOT BE USED AS SINGLE DRUGS FOR TREATMENT OF STAPH INFECTIONS.     Performed at [facility]   Report Status 12/12/2012 FINAL   Final  Organism ID, Bacteria STAPHYLOCOCCUS AUREUS   Final  CULTURE, BLOOD (ROUTINE X 2)     Status: None   Collection Time    12/11/12  1:40 AM      Result Value Range Status   Specimen Description BLOOD RIGHT ARM   Final   Special Requests BOTTLES DRAWN AEROBIC AND ANAEROBIC 10CC EACH   Final   Culture  Setup Time     Final   Value: 12/11/2012 08:34     Performed at [facility]   Culture     Final   Value:        BLOOD CULTURE RECEIVED NO GROWTH TO DATE CULTURE WILL BE HELD FOR 5 DAYS BEFORE ISSUING A FINAL NEGATIVE REPORT     Performed at [facility]   Report Status PENDING   Incomplete  CULTURE, BLOOD (ROUTINE X 2)     Status: None   Collection Time    12/11/12  1:45 AM      Result Value Range Status   Specimen Description BLOOD RIGHT HAND   Final   Special Requests BOTTLES DRAWN AEROBIC ONLY 10CC   Final   Culture  Setup Time     Final   Value: 12/11/2012 08:36     Performed at [facility]   Culture     Final   Value:        BLOOD CULTURE RECEIVED NO GROWTH TO DATE CULTURE WILL BE HELD FOR 5 DAYS BEFORE ISSUING A FINAL NEGATIVE REPORT     Performed at [facility]   Report Status PENDING   Incomplete     Studies: Ir Fluoro Guide Cv Line Right  12/14/2012   CLINICAL DATA:  Knee infection, access for IV antibiotics  EXAM: Right upper extremity single lumen power PICC LINE PLACEMENT WITH  ULTRASOUND AND FLUOROSCOPIC GUIDANCE  FLUOROSCOPY TIME:  6 seconds  PROCEDURE: The patient was advised of the possible risks and complications and agreed to undergo the procedure. The patient was then brought to the angiographic suite for the procedure.  The right arm was prepped with chlorhexidine, draped in the usual sterile fashion using maximum barrier technique (cap and mask, sterile gown, sterile gloves, large sterile sheet, hand hygiene and cutaneous antisepsis) and infiltrated locally with 1% Lidocaine.  Ultrasound demonstrated patency of the right brachial vein, and this was documented with an image. Under real-time ultrasound guidance, this vein was accessed with a 21 gauge micropuncture needle and image documentation was performed. A 0.018 wire was introduced in to the vein. Over this, a 5 [place] single lumen power injectable PICC was advanced to the lower SVC/right atrial junction. Fluoroscopy during the procedure and fluoro spot radiograph confirms appropriate catheter position. The catheter was flushed and covered with a sterile dressing.  Complications: No immediate  IMPRESSION: Successful right arm power PICC line placement with ultrasound and fluoroscopic guidance. The catheter is ready for use.   Electronically Signed   By: Ruel Favors M.D.   On: 12/14/2012 11:11   Ir US Guide Vasc Access Right  12/14/2012   CLINICAL DATA:  Knee infection, access for IV antibiotics  EXAM: Right upper extremity single lumen power PICC LINE PLACEMENT WITH ULTRASOUND AND FLUOROSCOPIC GUIDANCE  FLUOROSCOPY TIME:  6 seconds  PROCEDURE: The patient was advised of the possible risks and complications and agreed to undergo the procedure. The patient was then brought to the angiographic suite for the procedure.  The right arm was prepped with chlorhexidine, draped  in the usual sterile fashion using maximum barrier technique (cap and mask, sterile gown, sterile gloves, large sterile sheet, hand hygiene and cutaneous  antisepsis) and infiltrated locally with 1% Lidocaine.  Ultrasound demonstrated patency of the right brachial vein, and this was documented with an image. Under real-time ultrasound guidance, this vein was accessed with a 21 gauge micropuncture needle and image documentation was performed. A 0.018 wire was introduced in to the vein. Over this, a 5 [place] single lumen power injectable PICC was advanced to the lower SVC/right atrial junction. Fluoroscopy during the procedure and fluoro spot radiograph confirms appropriate catheter position. The catheter was flushed and covered with a sterile dressing.  Complications: No immediate  IMPRESSION: Successful right arm power PICC line placement with ultrasound and fluoroscopic guidance. The catheter is ready for use.   Electronically Signed   By: Ruel Favors M.D.   On: 12/14/2012 11:11    Scheduled Meds: . amLODipine  10 mg Oral Daily   And  . irbesartan  150 mg Oral Daily  . atorvastatin  40 mg Oral q1800  .  ceFAZolin (ANCEF) IV  2 g Intravenous Q8H  . docusate sodium  100 mg Oral BID  . enoxaparin (LOVENOX) injection  40 mg Subcutaneous Q24H  . nebivolol  10 mg Oral Daily  . rifampin  300 mg Oral Q12H   Continuous Infusions: . 0.9 % NaCl with KCl 20 mEq / L 10 mL/hr at 12/11/12 0500    Time spent: 15  Christiane Ha, MD  [facility] Pager [phone]. If 7PM-7AM, please contact night-coverage at [email or web address], password [email or web address] 12/14/2012, 1:22 PM  LOS: 5 days

## 2012-12-14 NOTE — Procedures (Signed)
Successful RUE SL POWER PICC TIP SVC/RA NO COMP STABLE READY FOR USE  

## 2012-12-14 NOTE — Progress Notes (Signed)
Peripherally Inserted Central Catheter/Midline Placement  The IV Nurse has discussed with the patient and/or persons authorized to consent for the patient, the purpose of this procedure and the potential benefits and risks involved with this procedure.  The benefits include less needle sticks, lab draws from the catheter and patient may be discharged home with the catheter.  Risks include, but not limited to, infection, bleeding, blood clot (thrombus formation), and puncture of an artery; nerve damage and irregular heat beat.  Alternatives to this procedure were also discussed.  PICC/Midline Placement Documentation        Stacie Glaze Horton 12/14/2012, 9:00 AM

## 2012-12-14 NOTE — Progress Notes (Signed)
Physical Therapy Treatment Patient Details Name: [name] [name] MRN: [id number] DOB: 09-03-1950 Today's Date: 12/14/2012 Time: 4098-1191 PT Time Calculation (min): 18 min  PT Assessment / Plan / Recommendation  History of Present Illness s/p I&D septic Rt TKA    PT Comments   Pt seems to self-limit herself as she requires motivation to increase distance but defers, however she is progressing with ability to perform bed mobility & sit<>stand transfers with increased independence.     Follow Up Recommendations  SNF;Supervision/Assistance - 24 hour     Does the patient have the potential to tolerate intense rehabilitation     Barriers to Discharge        Equipment Recommendations  None recommended by PT    Recommendations for Other Services    Frequency Min 6X/week   Progress towards PT Goals Progress towards PT goals: Progressing toward goals  Plan Current plan remains appropriate    Precautions / Restrictions Precautions Precautions: Fall;Knee Restrictions RLE Weight Bearing: Weight bearing as tolerated       Mobility  Bed Mobility Bed Mobility: Supine to Sit;Sitting - Scoot to Edge of Bed;Sit to Supine Supine to Sit: 5: Supervision;HOB flat;With rails Sitting - Scoot to Edge of Bed: 5: Supervision Sit to Supine: 5: Supervision;HOB flat Transfers Transfers: Sit to Stand;Stand to Sit Sit to Stand: 4: Min guard;With upper extremity assist;From bed;From toilet Stand to Sit: 4: Min guard;With upper extremity assist;To bed;To toilet Ambulation/Gait Ambulation/Gait Assistance: 4: Min guard Ambulation Distance (Feet): 24 Feet (12' + 12' bed<>bathroom) Assistive device: Rolling walker Ambulation/Gait Assistance Details: cont's to require cues for sequencing Gait Pattern: Step-to pattern (decreased floor clearance) General Gait Details: defers further distance due to fatigue Stairs: No      PT Goals (current goals can now be found in the care plan section) Acute Rehab  PT Goals PT Goal Formulation: With patient Time For Goal Achievement: 12/17/12 Potential to Achieve Goals: Good  Visit Information  Last PT Received On: 12/14/12 Assistance Needed: +1 History of Present Illness: s/p I&D septic Rt TKA     Subjective Data      Cognition  Cognition Arousal/Alertness: Awake/alert Behavior During Therapy: WFL for tasks assessed/performed Overall Cognitive Status: Within Functional Limits for tasks assessed    Balance     End of Session PT - End of Session Activity Tolerance: Patient tolerated treatment well;Patient limited by fatigue Patient left: in bed;with call bell/phone within reach Nurse Communication: Mobility status   GP     Lara Mulch 12/14/2012, 3:22 PM   Verdell Face, PTA [phone] 12/14/2012

## 2012-12-14 NOTE — Progress Notes (Signed)
Patient ID: [name] [name], female   DOB: Jun 09, 1950, 62 y.o.   MRN: [id number]  PROGRESS NOTE  Subjective:  negative for Chest Pain  negative for Shortness of Breath  positive for Nausea/, negative for vomiting   negative for Calf Pain  negative for Bowel Movement   Tolerating Diet: yes         Patient reports pain as 5 on 0-10 scale.       Objective: Vital signs in last 24 hours:   Patient Vitals for the past 24 hrs:  BP Temp Temp src Pulse Resp SpO2  12/14/12 0617 152/86 mmHg 98.9 F (37.2 C) Oral 97 16 96 %  12/13/12 2127 148/66 mmHg 99.2 F (37.3 C) Oral 99 16 97 %  12/13/12 1245 167/79 mmHg 99.3 F (37.4 C) - 97 16 95 %      Intake/Output from previous day:   10/29 0701 - 10/30 0700 In: 340 [P.O.:240] Out: -    Intake/Output this shift:   10/29 1901 - 10/30 0700 In: 240 [P.O.:240] Out: -    Intake/Output     10/29 0701 - 10/30 0700   P.O. 240   IV Piggyback 100   Total Intake(mL/kg) 340 (2.4)   Net +340       Urine Occurrence 4 x      LABORATORY DATA:  Recent Labs  12/09/12 0415 12/09/12 2140 12/11/12 0140  WBC 17.1* 17.4* 12.6*  HGB 13.6 12.9 12.1  HCT 41.5 39.3 37.9  PLT 290 241 268    Recent Labs  12/09/12 0415 12/09/12 2140 12/10/12 0605 12/11/12 0140  NA 135  --  139 140  K 4.1  --  4.3 4.4  CL 99  --  106 108  CO2 25  --  23 20  BUN 15  --  10 8  CREATININE 0.72 0.70 0.74 0.73  GLUCOSE 163*  --  149* 148*  CALCIUM 9.5  --  8.9 9.6   Lab Results  Component Value Date   INR 1.00 12/09/2012   INR 1.35 08/01/2010   INR 1.29 07/31/2010    Examination:  General appearance: alert, cooperative and no distress  Wound Exam: dressing in place  Drainage: no drainage noted through dressing  Motor Exam: grossly intact bilateral LE  Sensory Exam: grossly intact bilateral LE  Vascular Exam: Normal  Assessment:    5 Days Post-Op  Procedure(s) (LRB): ARTHROSCOPIC IRRIGATION AND DEBRIDEMENT RIGHT KNEE (Right)  ADDITIONAL  DIAGNOSIS:  Principal Problem:   Septic arthritis of knee, right Active Problems:   UTI (urinary tract infection)   Hyperlipidemia   Hypertension     Plan:  POD/HD#: 30  62 y/o female with septic R TKA   1. Septic R TKA POD 5 arthroscopic I&D  Activity as tolerated  Continue PT WBAT  Dressing change tomorrow.  Cultures positive for methicillin sensitive Staphylococcus aureus. Infectious disease following, recommending 6 week treatment with IV Cefazolin and rifampin. Repeat blood cultures performed on 12/11/2012 showing no growth   2. UTI  Urine cx shows no growth   3. ID  Appreciate recommendation  4. FEN  Diet as tolerated   5. DVT/PE prophylaxis  Lovenox   6. Hospitalist service on board to help with medical management  Appreciate eval   7. Right calf pain- Venous duplex  no evidence of DVT   8. Dispo  PICC line today Anticipate possible  D/C to SNF tomorrow Continue to monitor  Wilkie Aye 12/14/2012, 6:59 AM

## 2012-12-15 DIAGNOSIS — Z471 Aftercare following joint replacement surgery: Secondary | ICD-10-CM | POA: Diagnosis not present

## 2012-12-15 DIAGNOSIS — B999 Unspecified infectious disease: Secondary | ICD-10-CM | POA: Diagnosis not present

## 2012-12-15 DIAGNOSIS — M5030 Other cervical disc degeneration, unspecified cervical region: Secondary | ICD-10-CM | POA: Diagnosis not present

## 2012-12-15 DIAGNOSIS — Z87440 Personal history of urinary (tract) infections: Secondary | ICD-10-CM | POA: Diagnosis not present

## 2012-12-15 DIAGNOSIS — N390 Urinary tract infection, site not specified: Secondary | ICD-10-CM | POA: Diagnosis not present

## 2012-12-15 DIAGNOSIS — M6281 Muscle weakness (generalized): Secondary | ICD-10-CM | POA: Diagnosis not present

## 2012-12-15 DIAGNOSIS — Z5189 Encounter for other specified aftercare: Secondary | ICD-10-CM | POA: Diagnosis not present

## 2012-12-15 DIAGNOSIS — A4901 Methicillin susceptible Staphylococcus aureus infection, unspecified site: Secondary | ICD-10-CM | POA: Diagnosis not present

## 2012-12-15 DIAGNOSIS — I10 Essential (primary) hypertension: Secondary | ICD-10-CM | POA: Diagnosis not present

## 2012-12-15 DIAGNOSIS — Z792 Long term (current) use of antibiotics: Secondary | ICD-10-CM | POA: Diagnosis not present

## 2012-12-15 DIAGNOSIS — M25569 Pain in unspecified knee: Secondary | ICD-10-CM | POA: Diagnosis not present

## 2012-12-15 DIAGNOSIS — Z7982 Long term (current) use of aspirin: Secondary | ICD-10-CM | POA: Diagnosis not present

## 2012-12-15 DIAGNOSIS — Z9889 Other specified postprocedural states: Secondary | ICD-10-CM | POA: Diagnosis not present

## 2012-12-15 DIAGNOSIS — A4902 Methicillin resistant Staphylococcus aureus infection, unspecified site: Secondary | ICD-10-CM | POA: Diagnosis not present

## 2012-12-15 DIAGNOSIS — Z452 Encounter for adjustment and management of vascular access device: Secondary | ICD-10-CM | POA: Diagnosis not present

## 2012-12-15 DIAGNOSIS — T8450XA Infection and inflammatory reaction due to unspecified internal joint prosthesis, initial encounter: Secondary | ICD-10-CM | POA: Diagnosis not present

## 2012-12-15 DIAGNOSIS — Z96659 Presence of unspecified artificial knee joint: Secondary | ICD-10-CM | POA: Diagnosis not present

## 2012-12-15 DIAGNOSIS — M79609 Pain in unspecified limb: Secondary | ICD-10-CM | POA: Diagnosis not present

## 2012-12-15 DIAGNOSIS — I369 Nonrheumatic tricuspid valve disorder, unspecified: Secondary | ICD-10-CM | POA: Diagnosis not present

## 2012-12-15 DIAGNOSIS — M009 Pyogenic arthritis, unspecified: Secondary | ICD-10-CM | POA: Diagnosis not present

## 2012-12-15 DIAGNOSIS — M5412 Radiculopathy, cervical region: Secondary | ICD-10-CM | POA: Diagnosis not present

## 2012-12-15 DIAGNOSIS — E785 Hyperlipidemia, unspecified: Secondary | ICD-10-CM | POA: Diagnosis not present

## 2012-12-15 MED ORDER — HYDROCODONE-ACETAMINOPHEN 5-325 MG PO TABS: 1.0000 | ORAL_TABLET | ORAL | Status: DC | PRN

## 2012-12-15 MED ORDER — ENOXAPARIN SODIUM 40 MG/0.4ML ~~LOC~~ SOLN: 40.0000 mg | SUBCUTANEOUS | Status: DC

## 2012-12-15 MED ORDER — HEPARIN SOD (PORK) LOCK FLUSH 100 UNIT/ML IV SOLN: 250.0000 [IU] | INTRAVENOUS | Status: DC | PRN

## 2012-12-15 MED ORDER — RIFAMPIN 300 MG PO CAPS: 300.0000 mg | ORAL_CAPSULE | Freq: Two times a day (BID) | ORAL | Status: DC

## 2012-12-15 MED ORDER — CEFAZOLIN SODIUM-DEXTROSE 2-3 GM-% IV SOLR: 2.0000 g | Freq: Three times a day (TID) | INTRAVENOUS | Status: DC

## 2012-12-15 MED ORDER — WHITE PETROLATUM GEL
Status: AC
  Administered 2012-12-15: 0.2
  Filled 2012-12-15: qty 5

## 2012-12-15 NOTE — Progress Notes (Signed)
Patient ID: [name] [name], female   DOB: 1950/10/28, 62 y.o.   MRN: [id number]  PROGRESS NOTE  Subjective:  negative for Chest Pain  negative for Shortness of Breath  positive for Nausea, negative for Vomiting   negative for Calf Pain  positive for Bowel Movement   Tolerating Diet: yes         Patient reports pain as 5 on 0-10 scale.       Objective: Vital signs in last 24 hours:   Patient Vitals for the past 24 hrs:  BP Temp Pulse Resp SpO2  12/15/12 0633 136/75 mmHg 98.9 F (37.2 C) 82 18 99 %  12/14/12 2215 140/68 mmHg 98.6 F (37 C) 92 18 98 %  12/14/12 1257 136/76 mmHg 99.4 F (37.4 C) 95 16 97 %      Intake/Output from previous day:   10/30 0701 - 10/31 0700 In: 120 [I.V.:120] Out: -    Intake/Output this shift:       Intake/Output     10/30 0701 - 10/31 0700 10/31 0701 - 11/01 0700   P.O.     I.V. (mL/kg) 120 (0.8)    IV Piggyback     Total Intake(mL/kg) 120 (0.8)    Net +120          Urine Occurrence 6 x    Stool Occurrence 1 x       LABORATORY DATA:  Recent Labs  12/09/12 0415 12/09/12 2140 12/11/12 0140 12/14/12 0750  WBC 17.1* 17.4* 12.6* 12.4*  HGB 13.6 12.9 12.1 11.9*  HCT 41.5 39.3 37.9 36.2  PLT 290 241 268 311    Recent Labs  12/09/12 0415 12/09/12 2140 12/10/12 0605 12/11/12 0140  NA 135  --  139 140  K 4.1  --  4.3 4.4  CL 99  --  106 108  CO2 25  --  23 20  BUN 15  --  10 8  CREATININE 0.72 0.70 0.74 0.73  GLUCOSE 163*  --  149* 148*  CALCIUM 9.5  --  8.9 9.6   Lab Results  Component Value Date   INR 1.00 12/09/2012   INR 1.35 08/01/2010   INR 1.29 07/31/2010    Examination:  General appearance: alert, cooperative and no distress  Wound Exam: clean, dry, intact, dressing changed  Drainage:  None: wound tissue dry  Motor Exam: grossly intact bilateral LE  Sensory Exam: grossly intact bilateral LE  Vascular Exam: Normal  Assessment:    6 Days Post-Op  Procedure(s) (LRB): ARTHROSCOPIC IRRIGATION AND  DEBRIDEMENT RIGHT KNEE (Right)  ADDITIONAL DIAGNOSIS:  Principal Problem:   Septic arthritis of knee, right Active Problems:   UTI (urinary tract infection)   Hyperlipidemia   Hypertension     Plan: POD/HD#: 21  62 y/o female with septic R TKA   1. Septic R TKA POD 6 arthroscopic I&D  Activity as tolerated  Continue PT WBAT  Dressing change today.  Cultures positive for methicillin sensitive Staphylococcus aureus. Infectious disease following, recommending 6 week treatment with IV Cefazolin and rifampin. Repeat blood cultures performed on 12/11/2012 showing no growth   2. UTI  Urine cx shows no growth   3. ID  Appreciate recommendation   4. FEN  Diet as tolerated   5. DVT/PE prophylaxis  Lovenox   6. Hospitalist service on board to help with medical management  Appreciate eval   7. Right calf pain- Venous duplex no evidence of DVT   8. Dispo  PICC line  placed yesterday  Anticipate D/C to SNF today            Wilkie Aye 12/15/2012, 7:42 AM

## 2012-12-15 NOTE — Discharge Summary (Signed)
PATIENT ID: [name] [name]        MRN:  [id number]          DOB/AGE: 61-Oct-1952 / 61 y.o.    DISCHARGE SUMMARY  ADMISSION DATE:    12/09/2012 DISCHARGE DATE:   12/15/2012   ADMISSION DIAGNOSIS: leg pain Right Total Knee Infection    DISCHARGE DIAGNOSIS:  Right Total Knee Infection    ADDITIONAL DIAGNOSIS: Principal Problem:   Septic arthritis of knee, right Active Problems:   UTI (urinary tract infection)   Hyperlipidemia   Hypertension  Past Medical History  Diagnosis Date  . Hyperlipidemia   . Hypertension     PROCEDURE: Procedure(s): ARTHROSCOPIC IRRIGATION AND DEBRIDEMENT RIGHT KNEE Right on 12/09/2012  CONSULTS: ID, Hospitalist service, PT/OT, Soc work  Treatment Team:  Edsel Petrin, DO   HISTORY:  See H&P in chart  HOSPITAL COURSE:  [name] [name] is a 62 y.o. admitted on 12/09/2012 and found to have a diagnosis of Right Total Knee Infection.  After appropriate laboratory studies were obtained  they were taken to the operating room on 12/09/2012 and underwent  Procedure(s): ARTHROSCOPIC IRRIGATION AND DEBRIDEMENT RIGHT KNEE  Right.   They were given perioperative antibiotics:  Anti-infectives   Start     Dose/Rate Route Frequency Ordered Stop   12/15/12 0000  ceFAZolin (ANCEF) 2-3 GM-% SOLR     2 g 100 mL/hr over 30 Minutes Intravenous Every 8 hours 12/15/12 0756     12/15/12 0000  rifampin (RIFADIN) 300 MG capsule     300 mg Oral Every 12 hours 12/15/12 0756     12/11/12 1730  vancomycin (VANCOCIN) 1,500 mg in sodium chloride 0.9 % 500 mL IVPB  Status:  Discontinued     1,500 mg 250 mL/hr over 120 Minutes Intravenous Every 12 hours 12/10/12 1731 12/12/12 1037   12/11/12 1300  rifampin (RIFADIN) capsule 300 mg     300 mg Oral Every 12 hours 12/11/12 1125     12/10/12 2000  ceFAZolin (ANCEF) IVPB 2 g/50 mL premix     2 g 100 mL/hr over 30 Minutes Intravenous Every 8 hours 12/10/12 1733     12/10/12 1600  ceFAZolin (ANCEF) IVPB 2 g/50 mL premix   Status:  Discontinued     2 g 100 mL/hr over 30 Minutes Intravenous Every 8 hours 12/10/12 1444 12/10/12 1733   12/10/12 1430  rifampin (RIFADIN) capsule 300 mg  Status:  Discontinued     300 mg Oral Every 12 hours 12/10/12 1322 12/10/12 1532   12/10/12 1000  gentamicin (GARAMYCIN) 600 mg in dextrose 5 % 100 mL IVPB  Status:  Discontinued     600 mg 115 mL/hr over 60 Minutes Intravenous Every 24 hours 12/09/12 0930 12/10/12 1359   12/10/12 0100  vancomycin (VANCOCIN) 1,500 mg in sodium chloride 0.9 % 500 mL IVPB  Status:  Discontinued     1,500 mg 250 mL/hr over 120 Minutes Intravenous Every 12 hours 12/09/12 0930 12/10/12 1731   12/09/12 1655  ceFAZolin (ANCEF) 2-3 GM-% IVPB SOLR    Comments:  Zachery Conch, Scot   : cabinet override      12/09/12 1655 12/10/12 0459   12/09/12 1653  ceFAZolin (ANCEF) 1-5 GM-% IVPB    Comments:  Alanda Amass   : cabinet override      12/09/12 1653 12/10/12 0459   12/09/12 0930  gentamicin (GARAMYCIN) 600 mg in dextrose 5 % 100 mL IVPB     600 mg 115 mL/hr over 60  Minutes Intravenous STAT 12/09/12 0917 12/09/12 1132   12/09/12 0930  vancomycin (VANCOCIN) 2,500 mg in sodium chloride 0.9 % 500 mL IVPB     2,500 mg 250 mL/hr over 120 Minutes Intravenous STAT 12/09/12 0917 12/09/12 1410    .  Tolerated the procedure well.  Placed with a foley intraoperatively.      6 Days Post-Op Procedure(s) (LRB):  ARTHROSCOPIC IRRIGATION AND DEBRIDEMENT RIGHT KNEE (Right)   ADDITIONAL DIAGNOSIS:  Principal Problem:  Septic arthritis of knee, right  Active Problems:  UTI (urinary tract infection)  Hyperlipidemia  Hypertension    POD/HD#: 25  62 y/o female with septic R TKA   1. Septic R TKA POD 6 arthroscopic I&D  Activity as tolerated  Continue PT WBAT  Dressing change today.  Cultures positive for methicillin sensitive Staphylococcus aureus. Infectious disease following, recommending 6 week treatment with IV Cefazolin and rifampin. Repeat blood cultures  performed on 12/11/2012 showing no growth   2. UTI  Urine cx shows no growth   3. ID  Appreciate recommendation   4. FEN  Diet as tolerated   5. DVT/PE prophylaxis  Lovenox   6. Hospitalist service on board to help with medical management  Appreciate eval   7. Right calf pain- Venous duplex no evidence of DVT   8. Dispo  PICC line placed yesterday  Anticipate D/C to SNF today   .  The remainder of the hospital course was dedicated to ambulation and strengthening.   The patient was discharged on 6 Days Post-Op in  Stable condition.  Blood products given:none  DIAGNOSTIC STUDIES: Recent vital signs: Patient Vitals for the past 24 hrs:  BP Temp Pulse Resp SpO2  12/15/12 0633 136/75 mmHg 98.9 F (37.2 C) 82 18 99 %  12/14/12 2215 140/68 mmHg 98.6 F (37 C) 92 18 98 %  12/14/12 1257 136/76 mmHg 99.4 F (37.4 C) 95 16 97 %       Recent laboratory studies:  Recent Labs  12/09/12 0415 12/09/12 2140 12/11/12 0140 12/14/12 0750  WBC 17.1* 17.4* 12.6* 12.4*  HGB 13.6 12.9 12.1 11.9*  HCT 41.5 39.3 37.9 36.2  PLT 290 241 268 311    Recent Labs  12/09/12 0415 12/09/12 2140 12/10/12 0605 12/11/12 0140  NA 135  --  139 140  K 4.1  --  4.3 4.4  CL 99  --  106 108  CO2 25  --  23 20  BUN 15  --  10 8  CREATININE 0.72 0.70 0.74 0.73  GLUCOSE 163*  --  149* 148*  CALCIUM 9.5  --  8.9 9.6   Lab Results  Component Value Date   INR 1.00 12/09/2012   INR 1.35 08/01/2010   INR 1.29 07/31/2010     Recent Radiographic Studies :  Dg Chest 2 View  12/09/2012   CLINICAL DATA:  Medical clearance; history of smoking.  EXAM: CHEST  2 VIEW  COMPARISON:  Chest radiograph performed 07/21/2010  FINDINGS: The heart size and mediastinal contours are within normal limits. Both lungs are clear. Pulmonary vascularity is at the upper limits of normal. The visualized skeletal structures are unremarkable.  IMPRESSION: No active cardiopulmonary disease.   Electronically Signed   By:  Roanna Raider M.D.   On: 12/09/2012 03:07   Dg Cervical Spine Complete  12/01/2012   CLINICAL DATA:  Neck pain with radicular symptoms  EXAM: CERVICAL SPINE  4+ VIEWS  COMPARISON:  None.  FINDINGS: Frontal, lateral, open-mouth  odontoid, and bilateral oblique views were obtained. There is no fracture or spondylolisthesis. Prevertebral soft tissues and predental space regions are normal.  There are anterior osteophytes at all levels. There is mild disc space narrowing at C5-6. There is no appreciable exit foraminal narrowing on the oblique views.  There is calcification in both carotid arteries.  IMPRESSION: Osteoarthritic change. No fracture or spondylolisthesis. Carotid artery atherosclerotic calcification bilaterally.   Electronically Signed   By: Bretta Bang M.D.   On: 12/01/2012 15:54   Dg Knee 2 Views Right  12/09/2012   CLINICAL DATA:  Right knee pain for 3 days.  EXAM: RIGHT KNEE - 1-2 VIEW  COMPARISON:  Right knee radiographs performed 07/29/2010  FINDINGS: There is no evidence of fracture or dislocation. The patient is status post total knee arthroplasty; the arthroplasty appears grossly intact, with minimal associated degenerative change.  A small knee joint effusion is suspected. The visualized soft tissues are otherwise unremarkable in appearance.  IMPRESSION: 1. No evidence of fracture or dislocation; right total knee arthroplasty appears intact. 2. Suspect small knee joint effusion.   Electronically Signed   By: Roanna Raider M.D.   On: 12/09/2012 02:58   Ir Fluoro Guide Cv Line Right  12/14/2012   CLINICAL DATA:  Knee infection, access for IV antibiotics  EXAM: Right upper extremity single lumen power PICC LINE PLACEMENT WITH ULTRASOUND AND FLUOROSCOPIC GUIDANCE  FLUOROSCOPY TIME:  6 seconds  PROCEDURE: The patient was advised of the possible risks and complications and agreed to undergo the procedure. The patient was then brought to the angiographic suite for the procedure.  The  right arm was prepped with chlorhexidine, draped in the usual sterile fashion using maximum barrier technique (cap and mask, sterile gown, sterile gloves, large sterile sheet, hand hygiene and cutaneous antisepsis) and infiltrated locally with 1% Lidocaine.  Ultrasound demonstrated patency of the right brachial vein, and this was documented with an image. Under real-time ultrasound guidance, this vein was accessed with a 21 gauge micropuncture needle and image documentation was performed. A 0.018 wire was introduced in to the vein. Over this, a 5 [place] single lumen power injectable PICC was advanced to the lower SVC/right atrial junction. Fluoroscopy during the procedure and fluoro spot radiograph confirms appropriate catheter position. The catheter was flushed and covered with a sterile dressing.  Complications: No immediate  IMPRESSION: Successful right arm power PICC line placement with ultrasound and fluoroscopic guidance. The catheter is ready for use.   Electronically Signed   By: Ruel Favors M.D.   On: 12/14/2012 11:11   Ir US Guide Vasc Access Right  12/14/2012   CLINICAL DATA:  Knee infection, access for IV antibiotics  EXAM: Right upper extremity single lumen power PICC LINE PLACEMENT WITH ULTRASOUND AND FLUOROSCOPIC GUIDANCE  FLUOROSCOPY TIME:  6 seconds  PROCEDURE: The patient was advised of the possible risks and complications and agreed to undergo the procedure. The patient was then brought to the angiographic suite for the procedure.  The right arm was prepped with chlorhexidine, draped in the usual sterile fashion using maximum barrier technique (cap and mask, sterile gown, sterile gloves, large sterile sheet, hand hygiene and cutaneous antisepsis) and infiltrated locally with 1% Lidocaine.  Ultrasound demonstrated patency of the right brachial vein, and this was documented with an image. Under real-time ultrasound guidance, this vein was accessed with a 21 gauge micropuncture needle and image  documentation was performed. A 0.018 wire was introduced in to the vein. Over this, a  5 [place] single lumen power injectable PICC was advanced to the lower SVC/right atrial junction. Fluoroscopy during the procedure and fluoro spot radiograph confirms appropriate catheter position. The catheter was flushed and covered with a sterile dressing.  Complications: No immediate  IMPRESSION: Successful right arm power PICC line placement with ultrasound and fluoroscopic guidance. The catheter is ready for use.   Electronically Signed   By: Ruel Favors M.D.   On: 12/14/2012 11:11    DISCHARGE INSTRUCTIONS: Discharge Orders   Future Appointments Provider Department Dept Phone   12/28/2012 2:00 PM Gardiner Barefoot, MD [facility] [phone]   Future Orders Complete By Expires   Call MD / Call 911  As directed    Comments:     If you experience chest pain or shortness of breath, CALL 911 and be transported to the hospital emergency room.  If you develope a fever above 101 F, pus (white drainage) or increased drainage or redness at the wound, or calf pain, call your surgeon's office.   Constipation Prevention  As directed    Comments:     Drink plenty of fluids.  Prune juice may be helpful.  You may use a stool softener, such as Colace (over the counter) 100 mg twice a day.  Use MiraLax (over the counter) for constipation as needed.   Diet - low sodium heart healthy  As directed    Discharge instructions  As directed    Comments:     Knee arthroscopy/I&D Care After Refer to this sheet in the next few weeks. These discharge instructions provide you with general information on caring for yourself after you leave the hospital. Your caregiver may also give you specific instructions. Your treatment has been planned according to the most current medical practices available, but unavoidable complications sometimes occur. If you have any problems or questions after discharge,  please call your caregiver.  HOME INSTRUCTIONS  May shower and apply bandaids to incisions.  Begin knee range of motion exercises.  Weightbear as tolerated with crutches and wean off as comfort allows.  Elevate foot above heart level as much as possible.   Avoid lifting or driving until you are instructed otherwise.   (suture)  removal on Monday Continue IV cefazolin x 6 weeks. Continue Rifampin x 6 weeks. Further antibiotic recommendations per ID.   SEEK MEDICAL CARE IF: You have swelling of your calf or leg.  You develop shortness of breath or chest pain.  You have redness, swelling, or increasing pain in the wound.  There is pus or any unusual drainage coming from the surgical site.  You notice a bad smell coming from the surgical site or dressing.  The surgical site breaks open after sutures or staples have been removed.  There is persistent bleeding from the suture or staple line.  You are getting worse or are not improving.  You have any other questions or concerns.  SEEK IMMEDIATE MEDICAL CARE IF:  You have a fever.  You develop a rash.  You have difficulty breathing.  You develop any reaction or side effects to medicines given.  MAKE SURE YOU:  Understand these instructions.  Will watch your condition.  Will get help right away if you are not doing well or get worse.   Increase activity slowly as tolerated  As directed    Weight bearing as tolerated  As directed    Questions:     Laterality:     Extremity:  DISCHARGE MEDICATIONS:     Medication List         amLODipine-valsartan 10-160 MG per tablet  Commonly known as:  EXFORGE  Take 1 tablet by mouth daily.     aspirin 81 MG tablet  Take 81 mg by mouth daily.     Aspirin-Caffeine 845-65 MG Pack  Take 1 Package by mouth every 8 (eight) hours as needed (headache).     atorvastatin 40 MG tablet  Commonly known as:  LIPITOR  Take 40 mg by mouth daily.     ceFAZolin 2-3 GM-% Solr  Commonly known as:   ANCEF  Inject 50 mLs (2 g total) into the vein every 8 (eight) hours.     cyclobenzaprine 10 MG tablet  Commonly known as:  FLEXERIL  Take 1 tablet (10 mg total) by mouth 3 (three) times daily as needed for muscle spasms.     enoxaparin 40 MG/0.4ML injection  Commonly known as:  LOVENOX  Inject 0.4 mLs (40 mg total) into the skin daily.     HYDROcodone-acetaminophen 5-325 MG per tablet  Commonly known as:  NORCO/VICODIN  Take 1-2 tablets by mouth every 4 (four) hours as needed.     nebivolol 10 MG tablet  Commonly known as:  BYSTOLIC  Take 10 mg by mouth daily.     predniSONE 20 MG tablet  Commonly known as:  DELTASONE  Take 2 tablets (40 mg total) by mouth daily. For 5 days     rifampin 300 MG capsule  Commonly known as:  RIFADIN  Take 1 capsule (300 mg total) by mouth every 12 (twelve) hours.     traMADol 50 MG tablet  Commonly known as:  ULTRAM  Take 50 mg by mouth every 6 (six) hours as needed for pain (pain).        FOLLOW UP VISIT:       Follow-up Information   Follow up with Loreta Ave, MD In 1 week.   Specialty:  Orthopedic Surgery   Contact information:   [address] [place] [address] [phone]       DISPOSITION:   Skilled Nursing Facility/Rehab  CONDITION:  Stable   Wilkie Aye 12/15/2012, 7:58 AM

## 2012-12-17 LAB — CULTURE, BLOOD (ROUTINE X 2): Culture: NO GROWTH

## 2012-12-18 ENCOUNTER — Other Ambulatory Visit: Payer: Self-pay | Admitting: *Deleted

## 2012-12-18 ENCOUNTER — Encounter: Payer: Self-pay | Admitting: Internal Medicine

## 2012-12-18 ENCOUNTER — Non-Acute Institutional Stay (SKILLED_NURSING_FACILITY): Payer: Medicare Other | Admitting: Internal Medicine

## 2012-12-18 DIAGNOSIS — E785 Hyperlipidemia, unspecified: Secondary | ICD-10-CM

## 2012-12-18 DIAGNOSIS — M009 Pyogenic arthritis, unspecified: Secondary | ICD-10-CM | POA: Diagnosis not present

## 2012-12-18 DIAGNOSIS — Z9889 Other specified postprocedural states: Secondary | ICD-10-CM | POA: Insufficient documentation

## 2012-12-18 DIAGNOSIS — Z452 Encounter for adjustment and management of vascular access device: Secondary | ICD-10-CM

## 2012-12-18 DIAGNOSIS — I10 Essential (primary) hypertension: Secondary | ICD-10-CM | POA: Diagnosis not present

## 2012-12-18 DIAGNOSIS — N390 Urinary tract infection, site not specified: Secondary | ICD-10-CM

## 2012-12-18 NOTE — Progress Notes (Signed)
Clinical social worker assisted with patient discharge to skilled nursing facility, [facility].  CSW addressed all family questions and concerns. CSW copied chart and added all important documents. CSW also set up patient transportation with [facility]. Clinical Social Worker will sign off for now as social work intervention is no longer needed.   Cindee Mclester, MSW, LCSWA [phone] 

## 2012-12-18 NOTE — Assessment & Plan Note (Signed)
As aborve -6 weeks antibiotics

## 2012-12-18 NOTE — Progress Notes (Signed)
MRN: [id number] Name: [name] [name]  Sex: female Age: 62 y.o. DOB: 1951-02-15  PSC #: [facility] Dandy Facility/Room: 303 Level Of Care: SNF Provider: Merrilee Seashore D Emergency Contacts: Extended Emergency Contact Information Primary Emergency Contact: Jones,Clyde Address: [address]          [place], [place] [address] [place] Phone: [phone] Relation: Son Secondary Emergency Contact: Rebecca Eaton [place] Home Phone: [phone] Mobile Phone: [phone] Relation: Daughter  Code Status: FULL  Allergies: Review of patient's allergies indicates no known allergies.  Chief Complaint  Patient presents with  . nursing home admission    HPI: Patient is 63 y.o. female who has septic knee, s/sp TKA who is admitted for 6 weeks IV antibiotics and OT/PT  Past Medical History  Diagnosis Date  . Hyperlipidemia   . Hypertension   . S/P right knee arthroscopy     with septic joint growing out MSSA  . Septic arthritis of knee, right     Past Surgical History  Procedure Laterality Date  . Joint replacement    . Knee surgery Bilateral   . Knee arthroscopy Right 12/09/2012    Procedure: ARTHROSCOPIC IRRIGATION AND DEBRIDEMENT RIGHT KNEE;  Surgeon: Budd Palmer, MD;  Location: [facility] OR;  Service: Orthopedics;  Laterality: Right;      Medication List       This list is accurate as of: 12/18/12 12:00 PM.  Always use your most recent med list.               amLODipine-valsartan 10-160 MG per tablet  Commonly known as:  EXFORGE  Take 1 tablet by mouth daily.     aspirin 81 MG tablet  Take 81 mg by mouth daily.     Aspirin-Caffeine 845-65 MG Pack  Take 1 Package by mouth every 8 (eight) hours as needed (headache).     atorvastatin 40 MG tablet  Commonly known as:  LIPITOR  Take 40 mg by mouth daily.     ceFAZolin 2-3 GM-% Solr  Commonly known as:  ANCEF  Inject 50 mLs (2 g total) into the vein every 8 (eight) hours.      cyclobenzaprine 10 MG tablet  Commonly known as:  FLEXERIL  Take 1 tablet (10 mg total) by mouth 3 (three) times daily as needed for muscle spasms.     enoxaparin 40 MG/0.4ML injection  Commonly known as:  LOVENOX  Inject 0.4 mLs (40 mg total) into the skin daily.     HYDROcodone-acetaminophen 5-325 MG per tablet  Commonly known as:  NORCO/VICODIN  Take 1-2 tablets by mouth every 4 (four) hours as needed.     nebivolol 10 MG tablet  Commonly known as:  BYSTOLIC  Take 10 mg by mouth daily.     rifampin 300 MG capsule  Commonly known as:  RIFADIN  Take 1 capsule (300 mg total) by mouth every 12 (twelve) hours.     traMADol 50 MG tablet  Commonly known as:  ULTRAM  Take 50 mg by mouth every 6 (six) hours as needed for pain (pain).        No orders of the defined types were placed in this encounter.     There is no immunization history on file for this patient.  History  Substance Use Topics  . Smoking status: Former Smoker    Quit date: 08/01/1992  . Smokeless tobacco: Never Used  . Alcohol Use: No    Family history is noncontributory  Review of Systems  DATA OBTAINED: from patient, GENERAL: Feels well no fevers, +fatigue,no appetite changes SKIN: No itching, rash or wounds EYES: No eye pain, redness, discharge EARS: No earache, tinnitus, change in hearing NOSE: No congestion, drainage or bleeding  MOUTH/THROAT: No mouth or tooth pain, No sore throat, No difficulty chewing or swallowing  RESPIRATORY: No cough, wheezing, SOB CARDIAC: No chest pain, palpitations, lower extremity edema  GI: No abdominal pain, No N/V/D or constipation, No heartburn or reflux  GU: No dysuria, frequency or urgency, or incontinence  MUSCULOSKELETAL:R knee pain, pain controlled NEUROLOGIC: No headache, dizziness or focal weakness PSYCHIATRIC: No overt anxiety or sadness. Sleeps well. No behavior issue.   Filed Vitals:   12/18/12 1145  BP: 149/78  Pulse: 94  Temp: 98.5 F (36.9  C)  Resp: 20    Physical Exam  GENERAL APPEARANCE: Alert, conversant. Appropriately groomed. No acute distress.  SKIN: No diaphoresis rash;see below HEAD: Normocephalic, atraumatic  EYES: Conjunctiva/lids clear. Pupils round, reactive. EOMs intact.  EARS: External exam WNL, canals clear. Hearing grossly normal.  NOSE: No deformity or discharge.  MOUTH/THROAT: Lips w/o lesions. RESPIRATORY: Breathing is even, unlabored. Lung sounds are clear   CARDIOVASCULAR: Heart RRR no murmurs, rubs or gallops. No peripheral edema.  ARTERIAL: radial pulse 2+, DP pulse 1+  VENOUS: No varicosities. No venous stasis skin changes  GASTROINTESTINAL: Abdomen is soft, non-tender, not distended w/ normal bowel sounds GENITOURINARY: Bladder non tender, not distended  MUSCULOSKELETAL:R knee with midline incision, healing well;knee is swollen and warm but no erythema NEUROLOGIC: Oriented X3. Cranial nerves 2-12 grossly intact. Moves all extremities no tremor. PSYCHIATRIC: Mood and affect appropriate to situation, no behavioral issues  Patient Active Problem List   Diagnosis Date Noted  . S/P right knee arthroscopy   . UTI (urinary tract infection) 12/09/2012  . Septic arthritis of knee, right 12/09/2012  . Hyperlipidemia   . Hypertension     CBC    Component Value Date/Time   WBC 12.4* 12/14/2012 0750   RBC 4.20 12/14/2012 0750   HGB 11.9* 12/14/2012 0750   HCT 36.2 12/14/2012 0750   PLT 311 12/14/2012 0750   MCV 86.2 12/14/2012 0750   LYMPHSABS 2.2 12/14/2012 0750   MONOABS 1.0 12/14/2012 0750   EOSABS 0.1 12/14/2012 0750   BASOSABS 0.0 12/14/2012 0750    CMP     Component Value Date/Time   NA 140 12/11/2012 0140   K 4.4 12/11/2012 0140   CL 108 12/11/2012 0140   CO2 20 12/11/2012 0140   GLUCOSE 148* 12/11/2012 0140   BUN 8 12/11/2012 0140   CREATININE 0.73 12/11/2012 0140   CALCIUM 9.6 12/11/2012 0140   PROT 7.5 07/21/2010 1314   ALBUMIN 2.7* 12/11/2012 0140   AST 19 07/21/2010 1314    ALT 20 07/21/2010 1314   ALKPHOS 91 07/21/2010 1314   BILITOT 0.3 07/21/2010 1314   GFRNONAA >90 12/11/2012 0140   GFRAA >90 12/11/2012 0140    Assessment and Plan  S/P right knee arthroscopy On 12/09/2012- pt to have 6 weeks with IV cefazolin and po rifampin with PICC line DVT prophylaxis with lovenox\  Hypertension Continue bystolic and exforge  Septic arthritis of knee, right As aborve -6 weeks antibiotics  Hyperlipidemia Continue lipitor  UTI (urinary tract infection) Urine cx were negative-no UTI    Margit Hanks, MD

## 2012-12-18 NOTE — Assessment & Plan Note (Signed)
Continue lipitor  ?

## 2012-12-18 NOTE — Assessment & Plan Note (Signed)
Urine cx were negative-no UTI

## 2012-12-18 NOTE — Assessment & Plan Note (Signed)
On 12/09/2012- pt to have 6 weeks with IV cefazolin and po rifampin with PICC line DVT prophylaxis with lovenox\

## 2012-12-18 NOTE — Assessment & Plan Note (Signed)
Continue bystolic and exforge

## 2012-12-22 NOTE — Progress Notes (Signed)
I have seen and examined the patient. I agree with the findings above.  Laurenashley Viar H, MD  

## 2012-12-25 ENCOUNTER — Other Ambulatory Visit: Payer: Self-pay | Admitting: *Deleted

## 2012-12-25 MED ORDER — HYDROCODONE-ACETAMINOPHEN 5-325 MG PO TABS: ORAL_TABLET | ORAL | Status: DC

## 2012-12-26 ENCOUNTER — Other Ambulatory Visit: Payer: Self-pay | Admitting: *Deleted

## 2012-12-26 DIAGNOSIS — M009 Pyogenic arthritis, unspecified: Secondary | ICD-10-CM | POA: Diagnosis not present

## 2012-12-26 MED ORDER — TRAMADOL HCL 50 MG PO TABS: 50.0000 mg | ORAL_TABLET | Freq: Four times a day (QID) | ORAL | Status: DC | PRN

## 2012-12-28 ENCOUNTER — Encounter: Payer: Self-pay | Admitting: Internal Medicine

## 2012-12-28 ENCOUNTER — Ambulatory Visit (INDEPENDENT_AMBULATORY_CARE_PROVIDER_SITE_OTHER): Payer: Medicaid Other | Admitting: Internal Medicine

## 2012-12-28 VITALS — BP 154/81 | HR 88 | Temp 98.2°F | Ht 65.0 in | Wt 305.0 lb

## 2012-12-28 DIAGNOSIS — M009 Pyogenic arthritis, unspecified: Secondary | ICD-10-CM | POA: Diagnosis not present

## 2012-12-28 NOTE — Assessment & Plan Note (Signed)
Doing well with current therapy.  RTC on Dec 4 and remove picc if doing well.  Then will need continuation with Keflex for 3-6 months.

## 2012-12-28 NOTE — Progress Notes (Signed)
  Subjective:    Patient ID: [name] [name], female    DOB: 12-May-1950, 62 y.o.   MRN: [id number]  HPI [name] [name] is a 62 y.o. female with history of bilateral knee prosthetic joint replacements, last done on 07/2010 who presented to ED with 4 day history of right knee pain, swelling and increasing warmth to touch to her right leg. It became increasingly painful unable to weight bear during ambulation. She denies recent trauma to leg, no fevers, but does endorse chills. seh states that she noticed having some red lesions on her lower right leg which she is unclear of the eitology. She went to the ED on 10/25 due to increasing symptoms and unable to be seen in the ambulatory setting. She was Found to have leukocytosis of 17.1 with 81%N, arthrocentesis showed total WBC of 65,300 and 98%PMNs, arthrocentesis cultures showing rare gpc. She did have concurrent blood cultures drawn which 2 sets and grew MSSA.  She was started on cefazolin with rifampin and has been on it for about 2 weeks.  Complaint of red rash on groin.  Picc working well.  Hopeful to finish soon    Review of Systems  Constitutional: Negative for fever and chills.  Eyes: Negative for visual disturbance.  Respiratory: Negative for cough.   Cardiovascular: Negative for leg swelling.  Gastrointestinal: Negative for diarrhea.  Musculoskeletal: Positive for arthralgias.       Improved knee swelling  Skin: Positive for rash.  Neurological: Negative for dizziness, light-headedness and headaches.  Psychiatric/Behavioral: Negative for dysphoric mood.       Objective:   Physical Exam  Constitutional: She appears well-developed and well-nourished.  In wheelchair  HENT:  Mouth/Throat: No oropharyngeal exudate.  Eyes: Right eye exhibits no discharge. Left eye exhibits no discharge. No scleral icterus.  Cardiovascular: Normal rate, regular rhythm and normal heart sounds.   Neurological: She is alert.  Skin:  picc clean, dry, no  erythema          Assessment & Plan:

## 2013-01-17 ENCOUNTER — Encounter: Payer: Self-pay | Admitting: Internal Medicine

## 2013-01-17 ENCOUNTER — Non-Acute Institutional Stay (SKILLED_NURSING_FACILITY): Payer: Medicare Other | Admitting: Internal Medicine

## 2013-01-17 DIAGNOSIS — I10 Essential (primary) hypertension: Secondary | ICD-10-CM

## 2013-01-17 NOTE — Progress Notes (Signed)
MRN: [id number] Name: [name] [name]  Sex: female Age: 62 y.o. DOB: 07-Jan-1951  [facility] #: [email or web address] Facility/Room: 303 Level Of Care: SNF Provider: Merrilee Seashore D Emergency Contacts: Extended Emergency Contact Information Primary Emergency Contact: Jones,Clyde Address: [address]          [place], [place] [address] Darden Amber of [place] Phone: [phone] Relation: Son Secondary Emergency Contact: Rebecca Eaton [place] of [place] Home Phone: [phone] Mobile Phone: [phone] Relation: Daughter  Code Status: FULL  Allergies: Review of patient's allergies indicates no known allergies.  Chief Complaint  Patient presents with  . Acute Visit    HPI: Patient is 62 y.o. female who I am seeing today because her BP is high today and has been high for several days.  Past Medical History  Diagnosis Date  . Hyperlipidemia   . Hypertension   . S/P right knee arthroscopy     with septic joint growing out MSSA  . Septic arthritis of knee, right     Past Surgical History  Procedure Laterality Date  . Joint replacement    . Knee surgery Bilateral   . Knee arthroscopy Right 12/09/2012    Procedure: ARTHROSCOPIC IRRIGATION AND DEBRIDEMENT RIGHT KNEE;  Surgeon: Budd Palmer, MD;  Location: [facility] OR;  Service: Orthopedics;  Laterality: Right;      Medication List       This list is accurate as of: 01/17/13 12:53 PM.  Always use your most recent med list.               amLODipine-valsartan 10-160 MG per tablet  Commonly known as:  EXFORGE  Take 1 tablet by mouth daily.     aspirin 81 MG tablet  Take 81 mg by mouth daily.     Aspirin-Caffeine 845-65 MG Pack  Take 1 Package by mouth every 8 (eight) hours as needed (headache).     atorvastatin 40 MG tablet  Commonly known as:  LIPITOR  Take 40 mg by mouth daily.     ceFAZolin 2-3 GM-% Solr  Commonly known as:  ANCEF  Inject 2 g into the vein every 8 (eight) hours.     cyclobenzaprine 10 MG  tablet  Commonly known as:  FLEXERIL  Take 1 tablet (10 mg total) by mouth 3 (three) times daily as needed for muscle spasms.     enoxaparin 40 MG/0.4ML injection  Commonly known as:  LOVENOX  Inject 0.4 mLs (40 mg total) into the skin daily.     HYDROcodone-acetaminophen 5-325 MG per tablet  Commonly known as:  NORCO/VICODIN  Take one tablet by mouth every four hours as needed for moderate pain; Take two tablets by mouth every four hours as needed for severe pain     nebivolol 10 MG tablet  Commonly known as:  BYSTOLIC  Take 10 mg by mouth daily.     rifampin 300 MG capsule  Commonly known as:  RIFADIN  Take 300 mg by mouth every 12 (twelve) hours.     traMADol 50 MG tablet  Commonly known as:  ULTRAM  Take 1 tablet (50 mg total) by mouth every 6 (six) hours as needed.        Meds ordered this encounter  Medications  . ceFAZolin (ANCEF) 2-3 GM-% SOLR    Sig: Inject 2 g into the vein every 8 (eight) hours.  . rifampin (RIFADIN) 300 MG capsule    Sig: Take 300 mg by mouth every 12 (twelve) hours.  There is no immunization history on file for this patient.  History  Substance Use Topics  . Smoking status: Former Smoker    Quit date: 08/01/1992  . Smokeless tobacco: Never Used  . Alcohol Use: No    Review of Systems  DATA OBTAINED: from patient, nurse; PT HAS NO C/O OF HA, SOB, CP ,ETC; NURSE REPORTS THAT PT IS EATING A;LL THE TIME, LOTS OF SALTY FOODS, NEVER GETS OUT OF BED;PT SAYS THAT SHE IS NOT GETTING HER BP MEDS BUT GREAT KNOWS FOR SURE SHE GOT HER MEDS YESTERDAY AND TODAY GENERAL: Feels well no fevers, fatigue, appetite changes SKIN: No itching, rash HEENT: No complaint RESPIRATORY: No cough, wheezing, SOB CARDIAC: No chest pain, palpitations, lower extremity edema  GI: No abdominal pain, No N/V/D or constipation, No heartburn or reflux  GU: No dysuria, frequency or urgency, or incontinence  MUSCULOSKELETAL: No unrelieved bone/joint pain NEUROLOGIC: No  headache, dizziness or focal weakness PSYCHIATRIC: No overt anxiety or sadness. Sleeps well.   Filed Vitals:   01/17/13 1247  BP: 189/99  Pulse: 82  Temp:   Resp:     Physical Exam  GENERAL APPEARANCE: Alert, conversant. Appropriately groomed. No acute distress ;OBESE SKIN: No diaphoresis rash, or wounds HEENT: Unremarkable RESPIRATORY: Breathing is even, unlabored. Lung sounds are clear   CARDIOVASCULAR: Heart RRR no murmurs, rubs or gallops. No peripheral edema  GASTROINTESTINAL: Abdomen is soft, non-tender, not distended w/ normal bowel sounds.  GENITOURINARY: Bladder non tender, not distended  MUSCULOSKELETAL:MINIMA;L CREPITANCE TO r KNEE;NO HEAT ,REDNESS OR APPRECIABLE SWELLING NEUROLOGIC: Cranial nerves 2-12 grossly intact. Moves all extremities no tremor. PSYCHIATRIC: Mood and affect appropriate to situation, no behavioral issues  Patient Active Problem List   Diagnosis Date Noted  . S/P right knee arthroscopy   . UTI (urinary tract infection) 12/09/2012  . Septic arthritis of knee, right 12/09/2012  . Hyperlipidemia   . Hypertension     CBC    Component Value Date/Time   WBC 12.4* 12/14/2012 0750   RBC 4.20 12/14/2012 0750   HGB 11.9* 12/14/2012 0750   HCT 36.2 12/14/2012 0750   PLT 311 12/14/2012 0750   MCV 86.2 12/14/2012 0750   LYMPHSABS 2.2 12/14/2012 0750   MONOABS 1.0 12/14/2012 0750   EOSABS 0.1 12/14/2012 0750   BASOSABS 0.0 12/14/2012 0750    CMP     Component Value Date/Time   NA 140 12/11/2012 0140   K 4.4 12/11/2012 0140   CL 108 12/11/2012 0140   CO2 20 12/11/2012 0140   GLUCOSE 148* 12/11/2012 0140   BUN 8 12/11/2012 0140   CREATININE 0.73 12/11/2012 0140   CALCIUM 9.6 12/11/2012 0140   PROT 7.5 07/21/2010 1314   ALBUMIN 2.7* 12/11/2012 0140   AST 19 07/21/2010 1314   ALT 20 07/21/2010 1314   ALKPHOS 91 07/21/2010 1314   BILITOT 0.3 07/21/2010 1314   GFRNONAA >90 12/11/2012 0140   GFRAA >90 12/11/2012 0140    Assessment and  Plan  Hypertension Will change exforge to 10/320 for discharge tomorrow. Today she will get an extra valsartan 160 mg and tomorrow as well. Will need to f/u with PCP in a week.  TIME SPENT WITH PT RECORDS, NURSE AND PT >25 MIN  Margit Hanks, MD

## 2013-01-17 NOTE — Assessment & Plan Note (Signed)
Will change exforge to 10/320 for discharge tomorrow. Today she will get an extra valsartan 160 mg and tomorrow as well. Will need to f/u with PCP in a week.

## 2013-01-18 ENCOUNTER — Non-Acute Institutional Stay (SKILLED_NURSING_FACILITY): Payer: Medicare Other | Admitting: Internal Medicine

## 2013-01-18 ENCOUNTER — Encounter: Payer: Self-pay | Admitting: Internal Medicine

## 2013-01-18 ENCOUNTER — Ambulatory Visit (INDEPENDENT_AMBULATORY_CARE_PROVIDER_SITE_OTHER): Payer: Medicare Other | Admitting: Internal Medicine

## 2013-01-18 VITALS — BP 195/80 | HR 102 | Temp 97.5°F | Wt 306.0 lb

## 2013-01-18 DIAGNOSIS — Z9889 Other specified postprocedural states: Secondary | ICD-10-CM

## 2013-01-18 DIAGNOSIS — T8459XD Infection and inflammatory reaction due to other internal joint prosthesis, subsequent encounter: Secondary | ICD-10-CM

## 2013-01-18 DIAGNOSIS — Z5189 Encounter for other specified aftercare: Secondary | ICD-10-CM

## 2013-01-18 DIAGNOSIS — N390 Urinary tract infection, site not specified: Secondary | ICD-10-CM

## 2013-01-18 DIAGNOSIS — I10 Essential (primary) hypertension: Secondary | ICD-10-CM | POA: Diagnosis not present

## 2013-01-18 LAB — C-REACTIVE PROTEIN: CRP: 0.9 mg/dL — ABNORMAL HIGH (ref <0.60)

## 2013-01-18 MED ORDER — CEPHALEXIN 500 MG PO CAPS: 500.0000 mg | ORAL_CAPSULE | Freq: Four times a day (QID) | ORAL | Status: DC

## 2013-01-18 NOTE — Assessment & Plan Note (Addendum)
Doing well and I will stop her IV antibiotics and change her to Keflex. Anticipate 3-6 months of Keflex. I will check her sedimentation rate and CRP today as well to see how it compares. This will help guide duration of oral therapy. Prescription was sent to her preferred pharmacy. She will return in 3 months.

## 2013-01-18 NOTE — Progress Notes (Signed)
  Subjective:    Patient ID: [name] [name], female    DOB: May 13, 1950, 62 y.o.   MRN: [id number]  HPI  [name] [name] is a 62 y.o. female with history of bilateral knee prosthetic joint replacements, last done on 07/2010 who presented to ED with 4 day history of right knee pain, swelling and increasing warmth to touch to her right leg. It became increasingly painful unable to weight bear during ambulation. She denies recent trauma to leg, no fevers, but does endorse chills. seh states that she noticed having some red lesions on her lower right leg which she is unclear of the eitology. She went to the ED on 10/25 due to increasing symptoms and unable to be seen in the ambulatory setting. She was Found to have leukocytosis of 17.1 with 81%N, arthrocentesis showed total WBC of 65,300 and 98%PMNs, arthrocentesis cultures showing rare gpc. She did have concurrent blood cultures drawn which 2 sets and grew MSSA.     She has been now on cefazolin for 6 weeks. She is having no fever, no chills, no diarrhea or rashes. Her knee has been doing well and she is walking on it now. Minimal edema and no warmth or significant pain. She is going to leave the facility as soon as her antibiotics are changed to oral regimen.    Review of Systems  Constitutional: Negative for fever and chills.  Eyes: Negative for visual disturbance.  Respiratory: Negative for cough.   Cardiovascular: Negative for leg swelling.  Gastrointestinal: Negative for diarrhea.  Musculoskeletal: Positive for arthralgias.       Improved knee swelling  Skin: Positive for rash.  Neurological: Negative for dizziness, light-headedness and headaches.  Psychiatric/Behavioral: Negative for dysphoric mood.       Objective:   Physical Exam  Constitutional: She is oriented to person, place, and time. She appears well-developed and well-nourished.  In wheelchair  HENT:  Mouth/Throat: No oropharyngeal exudate.  Eyes: Right eye exhibits no  discharge. Left eye exhibits no discharge. No scleral icterus.  Cardiovascular: Normal rate, regular rhythm and normal heart sounds.   Neurological: She is alert and oriented to person, place, and time.  Skin: No rash noted.  Psychiatric: She has a normal mood and affect.          Assessment & Plan:

## 2013-01-18 NOTE — Progress Notes (Signed)
MRN: [id number] Name: [name] [name]  Sex: female Age: 62 y.o. DOB: 07-30-1950  PSC #: [facility] Facility/Room: Level Of Care: SNF Provider: Merrilee Seashore D Emergency Contacts: Extended Emergency Contact Information Primary Emergency Contact: Jones,Clyde Address: [address]          [place], [place] [address] Darden Amber of [place] Phone: [phone] Relation: Son Secondary Emergency Contact: Rebecca Eaton [place] of [place] Home Phone: [phone] Mobile Phone: [phone] Relation: Daughter  Allergies: Review of patient's allergies indicates no known allergies.  Chief Complaint  Patient presents with  . Discharge Note    HPI: Patient is 62 y.o. female who is being d/c to home after being admitted for a septic knee replacement for IV antibiotics and OT/PT.  Past Medical History  Diagnosis Date  . Hyperlipidemia   . Hypertension   . S/P right knee arthroscopy     with septic joint growing out MSSA  . Septic arthritis of knee, right     Past Surgical History  Procedure Laterality Date  . Joint replacement    . Knee surgery Bilateral   . Knee arthroscopy Right 12/09/2012    Procedure: ARTHROSCOPIC IRRIGATION AND DEBRIDEMENT RIGHT KNEE;  Surgeon: Budd Palmer, MD;  Location: [facility] OR;  Service: Orthopedics;  Laterality: Right;      Medication List       This list is accurate as of: 01/18/13 11:59 PM.  Always use your most recent med list.               amLODipine-valsartan 10-160 MG per tablet  Commonly known as:  EXFORGE  Take 1 tablet by mouth daily.     aspirin 81 MG tablet  Take 81 mg by mouth daily.     atorvastatin 40 MG tablet  Commonly known as:  LIPITOR  Take 40 mg by mouth daily.     cephALEXin 500 MG capsule  Commonly known as:  KEFLEX  Take 1 capsule (500 mg total) by mouth 4 (four) times daily.     cyclobenzaprine 10 MG tablet  Commonly known as:  FLEXERIL  Take 1 tablet (10 mg total) by mouth 3 (three) times  daily as needed for muscle spasms.     enoxaparin 40 MG/0.4ML injection  Commonly known as:  LOVENOX  Inject 0.4 mLs (40 mg total) into the skin daily.     HYDROcodone-acetaminophen 5-325 MG per tablet  Commonly known as:  NORCO/VICODIN  Take one tablet by mouth every four hours as needed for moderate pain; Take two tablets by mouth every four hours as needed for severe pain     nebivolol 10 MG tablet  Commonly known as:  BYSTOLIC  Take 10 mg by mouth daily.     rifampin 300 MG capsule  Commonly known as:  RIFADIN  Take 300 mg by mouth every 12 (twelve) hours.     traMADol 50 MG tablet  Commonly known as:  ULTRAM  Take 1 tablet (50 mg total) by mouth every 6 (six) hours as needed.        No orders of the defined types were placed in this encounter.     There is no immunization history on file for this patient.  History  Substance Use Topics  . Smoking status: Former Smoker    Quit date: 08/01/1992  . Smokeless tobacco: Never Used     Comment: pt. does not smoke  . Alcohol Use: No    Filed Vitals:   01/19/13 1026  BP:  165/90  Pulse: 99  Temp: 98.7 F (37.1 C)  Resp: 18    Physical Exam  GENERAL APPEARANCE: Alert, conversant. Appropriately groomed. No acute distress.  HEENT: Unremarkable. RESPIRATORY: Breathing is even, unlabored. Lung sounds are clear   CARDIOVASCULAR: Heart RRR no murmurs, rubs or gallops. No peripheral edema.  GASTROINTESTINAL: Abdomen is soft, non-tender, not distended w/ normal bowel sounds.  NEUROLOGIC: Cranial nerves 2-12 grossly intact. Moves all extremities no tremor.  Patient Active Problem List   Diagnosis Date Noted  . S/P right knee arthroscopy   . UTI (urinary tract infection) 12/09/2012  . Infection of prosthetic knee joint 12/09/2012  . Hyperlipidemia   . Hypertension     CBC    Component Value Date/Time   WBC 12.4* 12/14/2012 0750   RBC 4.20 12/14/2012 0750   HGB 11.9* 12/14/2012 0750   HCT 36.2 12/14/2012 0750    PLT 311 12/14/2012 0750   MCV 86.2 12/14/2012 0750   LYMPHSABS 2.2 12/14/2012 0750   MONOABS 1.0 12/14/2012 0750   EOSABS 0.1 12/14/2012 0750   BASOSABS 0.0 12/14/2012 0750    CMP     Component Value Date/Time   NA 140 12/11/2012 0140   K 4.4 12/11/2012 0140   CL 108 12/11/2012 0140   CO2 20 12/11/2012 0140   GLUCOSE 148* 12/11/2012 0140   BUN 8 12/11/2012 0140   CREATININE 0.73 12/11/2012 0140   CALCIUM 9.6 12/11/2012 0140   PROT 7.5 07/21/2010 1314   ALBUMIN 2.7* 12/11/2012 0140   AST 19 07/21/2010 1314   ALT 20 07/21/2010 1314   ALKPHOS 91 07/21/2010 1314   BILITOT 0.3 07/21/2010 1314   GFRNONAA >90 12/11/2012 0140   GFRAA >90 12/11/2012 0140    Assessment and Plan  Pt was seen by Dr Luciana Axe today and PICC and IV antibiotics were stopped. Pt will be on a course of Keflex per Dr Luciana Axe. In the last few days pt's BP had increased significantly and a medication change was made but I expect that more changes may be needed.Pt was d/c with home health and  OT/PT but the last I heard she was refusing them.   Margit Hanks, MD

## 2013-01-19 ENCOUNTER — Encounter: Payer: Self-pay | Admitting: Internal Medicine

## 2013-01-30 DIAGNOSIS — M009 Pyogenic arthritis, unspecified: Secondary | ICD-10-CM | POA: Diagnosis not present

## 2013-01-31 DIAGNOSIS — L538 Other specified erythematous conditions: Secondary | ICD-10-CM | POA: Diagnosis not present

## 2013-01-31 DIAGNOSIS — M009 Pyogenic arthritis, unspecified: Secondary | ICD-10-CM | POA: Diagnosis not present

## 2013-01-31 DIAGNOSIS — Z6841 Body Mass Index (BMI) 40.0 and over, adult: Secondary | ICD-10-CM | POA: Diagnosis not present

## 2013-01-31 DIAGNOSIS — I10 Essential (primary) hypertension: Secondary | ICD-10-CM | POA: Diagnosis not present

## 2013-03-05 ENCOUNTER — Ambulatory Visit: Payer: Medicare Other | Admitting: Podiatry

## 2013-03-13 DIAGNOSIS — E782 Mixed hyperlipidemia: Secondary | ICD-10-CM | POA: Diagnosis not present

## 2013-03-13 DIAGNOSIS — I10 Essential (primary) hypertension: Secondary | ICD-10-CM | POA: Diagnosis not present

## 2013-03-13 DIAGNOSIS — M67919 Unspecified disorder of synovium and tendon, unspecified shoulder: Secondary | ICD-10-CM | POA: Diagnosis not present

## 2013-03-13 DIAGNOSIS — M25569 Pain in unspecified knee: Secondary | ICD-10-CM | POA: Diagnosis not present

## 2013-03-13 DIAGNOSIS — Z6841 Body Mass Index (BMI) 40.0 and over, adult: Secondary | ICD-10-CM | POA: Diagnosis not present

## 2013-03-13 DIAGNOSIS — E119 Type 2 diabetes mellitus without complications: Secondary | ICD-10-CM | POA: Diagnosis not present

## 2013-03-13 DIAGNOSIS — M719 Bursopathy, unspecified: Secondary | ICD-10-CM | POA: Diagnosis not present

## 2013-03-14 ENCOUNTER — Encounter: Payer: Self-pay | Admitting: Podiatrist

## 2013-03-14 ENCOUNTER — Ambulatory Visit (INDEPENDENT_AMBULATORY_CARE_PROVIDER_SITE_OTHER): Payer: Medicare Other | Admitting: Podiatrist

## 2013-03-14 VITALS — BP 139/64 | HR 78 | Resp 16

## 2013-03-14 DIAGNOSIS — L851 Acquired keratosis [keratoderma] palmaris et plantaris: Secondary | ICD-10-CM

## 2013-03-14 DIAGNOSIS — M216X9 Other acquired deformities of unspecified foot: Secondary | ICD-10-CM

## 2013-03-14 DIAGNOSIS — H4011X0 Primary open-angle glaucoma, stage unspecified: Secondary | ICD-10-CM | POA: Diagnosis not present

## 2013-03-14 DIAGNOSIS — M216X1 Other acquired deformities of right foot: Secondary | ICD-10-CM

## 2013-03-14 NOTE — Progress Notes (Signed)
   HPI:  Patient presents today for follow up of painful lesion submetatarsal 5 of the right foot. She recently had an infected prosthesis of her right knee however she states now that this is no longer giving her trouble.  Objective:  Patients chart is reviewed.  Neurovascular status unchanged. With palpable pedal pulses and neurological sensation intact to the right foot. Intractable hyperkeratotic lesion submetatarsal 5 of the right foot is noted. No redness, no swelling, no signs of infection are present. Ground glass appearance is noted.  Assessment:  Intractable hyperkeratotic lesion submetatarsal 5 of the right foot.  Plan:  Discussed treatment options and alternatives.  The symptomatic lesion was debrided with a 15 blade without complication. Discussed if it comes back and continues to be painful we can consider excision of the lesion however this would require sutures and staying off her foot for one to 2 weeks. She'll be seen back as needed for followup.

## 2013-03-19 ENCOUNTER — Ambulatory Visit: Payer: Medicare Other | Admitting: Podiatry

## 2013-03-21 ENCOUNTER — Other Ambulatory Visit: Payer: Self-pay | Admitting: Orthopedic Surgery

## 2013-03-21 DIAGNOSIS — M25511 Pain in right shoulder: Secondary | ICD-10-CM

## 2013-03-25 ENCOUNTER — Ambulatory Visit
Admission: RE | Admit: 2013-03-25 | Discharge: 2013-03-25 | Disposition: A | Discharge status: Home / Self Care | Payer: Medicaid Other | Source: Ambulatory Visit | Attending: Orthopedic Surgery | Admitting: Orthopedic Surgery

## 2013-03-25 DIAGNOSIS — M19019 Primary osteoarthritis, unspecified shoulder: Secondary | ICD-10-CM | POA: Diagnosis not present

## 2013-03-25 DIAGNOSIS — M25511 Pain in right shoulder: Secondary | ICD-10-CM

## 2013-03-25 DIAGNOSIS — M67919 Unspecified disorder of synovium and tendon, unspecified shoulder: Secondary | ICD-10-CM | POA: Diagnosis not present

## 2013-03-27 DIAGNOSIS — M25569 Pain in unspecified knee: Secondary | ICD-10-CM | POA: Diagnosis not present

## 2013-04-26 ENCOUNTER — Ambulatory Visit: Payer: Medicare Other | Admitting: Internal Medicine

## 2013-05-08 ENCOUNTER — Telehealth: Payer: Self-pay | Admitting: Licensed Clinical Social Worker

## 2013-05-08 ENCOUNTER — Ambulatory Visit: Payer: Medicare Other | Admitting: Internal Medicine

## 2013-05-08 NOTE — Telephone Encounter (Signed)
Patient called stating that she was unable to make her appointment today at 10:15 because she was unable to get in her car to drive due to knee stiffness, extreme pain and throbbing in her leg. I advised that she really needed to be seen, she could not get anyone to bring her today. Patient would like me to cancel appointment today and ask if Dr. Luciana Axeomer could do something for her over the phone. I explained to the patient once again she really needed to be seen because infection could be back. She would like Dr. Luciana Axeomer to call her if he is available. Please advise

## 2013-05-08 NOTE — Telephone Encounter (Signed)
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  She needs to be seen by the orthopedist.

## 2013-05-08 NOTE — Telephone Encounter (Signed)
Yes it is, I notified the patient.

## 2013-05-21 ENCOUNTER — Ambulatory Visit: Payer: Medicare Other | Admitting: Podiatry

## 2013-05-22 ENCOUNTER — Telehealth: Payer: Self-pay | Admitting: *Deleted

## 2013-05-22 NOTE — Telephone Encounter (Signed)
Patient called trying to reschedule missed appointment in March.  Patient needs an afternoon appointment, around 2:30.  Gave appointment for 4/30 at 2:30 with Dr. Luciana Axeomer.  Andree CossHowell, Michelle M, RN

## 2013-05-22 NOTE — Telephone Encounter (Signed)
error 

## 2013-05-31 DIAGNOSIS — N393 Stress incontinence (female) (male): Secondary | ICD-10-CM | POA: Diagnosis not present

## 2013-05-31 DIAGNOSIS — R350 Frequency of micturition: Secondary | ICD-10-CM | POA: Diagnosis not present

## 2013-05-31 DIAGNOSIS — N318 Other neuromuscular dysfunction of bladder: Secondary | ICD-10-CM | POA: Diagnosis not present

## 2013-05-31 DIAGNOSIS — Z6841 Body Mass Index (BMI) 40.0 and over, adult: Secondary | ICD-10-CM | POA: Diagnosis not present

## 2013-06-12 ENCOUNTER — Ambulatory Visit: Payer: Medicare Other | Admitting: Internal Medicine

## 2013-06-13 ENCOUNTER — Encounter: Payer: Self-pay | Admitting: Podiatry

## 2013-06-13 ENCOUNTER — Ambulatory Visit (INDEPENDENT_AMBULATORY_CARE_PROVIDER_SITE_OTHER): Payer: Medicare Other | Admitting: Podiatry

## 2013-06-13 VITALS — BP 138/58 | HR 77 | Resp 16

## 2013-06-13 DIAGNOSIS — L84 Corns and callosities: Secondary | ICD-10-CM

## 2013-06-13 DIAGNOSIS — M779 Enthesopathy, unspecified: Secondary | ICD-10-CM | POA: Diagnosis not present

## 2013-06-13 MED ORDER — TRIAMCINOLONE ACETONIDE 10 MG/ML IJ SUSP
10.0000 mg | Freq: Once | INTRAMUSCULAR | Status: AC
  Administered 2013-06-13: 10 mg

## 2013-06-13 NOTE — Progress Notes (Signed)
Subjective:     Patient ID: [name] HarborMyrtle [name], female   DOB: 05-14-1950, 63 y.o.   MRN: [id number]  HPI patient presents with inflammation and pain around the fifth metatarsal head right that and very tender for her and was trimmed in January without relief of symptoms   Review of Systems     Objective:   Physical Exam Neurovascular status intact with patient well oriented x3 and is found to have inflammation and pain fifth metatarsal head right foot with callus formation    Assessment:     Capsulitis with keratotic lesion right fifth MPJ with pain    Plan:     Injected the right fifth MPJ 3 mg Kenalog dexamethasone combination and then debrided the lesion fully which gives patient relief. Reappoint when symptomatic again

## 2013-06-14 ENCOUNTER — Ambulatory Visit: Payer: Medicare Other | Admitting: Internal Medicine

## 2013-06-15 ENCOUNTER — Other Ambulatory Visit: Payer: Self-pay

## 2013-06-15 DIAGNOSIS — Z1231 Encounter for screening mammogram for malignant neoplasm of breast: Secondary | ICD-10-CM

## 2013-06-19 ENCOUNTER — Ambulatory Visit (INDEPENDENT_AMBULATORY_CARE_PROVIDER_SITE_OTHER): Payer: Medicare Other | Admitting: Internal Medicine

## 2013-06-19 ENCOUNTER — Encounter: Payer: Self-pay | Admitting: Internal Medicine

## 2013-06-19 VITALS — BP 145/77 | HR 83 | Temp 98.7°F | Wt 320.0 lb

## 2013-06-19 DIAGNOSIS — M5030 Other cervical disc degeneration, unspecified cervical region: Secondary | ICD-10-CM | POA: Diagnosis not present

## 2013-06-19 DIAGNOSIS — I10 Essential (primary) hypertension: Secondary | ICD-10-CM | POA: Diagnosis not present

## 2013-06-19 DIAGNOSIS — M25569 Pain in unspecified knee: Secondary | ICD-10-CM | POA: Diagnosis not present

## 2013-06-19 DIAGNOSIS — M5412 Radiculopathy, cervical region: Secondary | ICD-10-CM | POA: Diagnosis not present

## 2013-06-19 DIAGNOSIS — T8450XA Infection and inflammatory reaction due to unspecified internal joint prosthesis, initial encounter: Secondary | ICD-10-CM

## 2013-06-19 DIAGNOSIS — A4901 Methicillin susceptible Staphylococcus aureus infection, unspecified site: Secondary | ICD-10-CM | POA: Diagnosis not present

## 2013-06-19 DIAGNOSIS — I369 Nonrheumatic tricuspid valve disorder, unspecified: Secondary | ICD-10-CM | POA: Diagnosis not present

## 2013-06-19 DIAGNOSIS — T8459XA Infection and inflammatory reaction due to other internal joint prosthesis, initial encounter: Secondary | ICD-10-CM

## 2013-06-19 DIAGNOSIS — Z96659 Presence of unspecified artificial knee joint: Secondary | ICD-10-CM | POA: Diagnosis not present

## 2013-06-19 DIAGNOSIS — E785 Hyperlipidemia, unspecified: Secondary | ICD-10-CM | POA: Diagnosis not present

## 2013-06-19 DIAGNOSIS — M009 Pyogenic arthritis, unspecified: Secondary | ICD-10-CM | POA: Diagnosis not present

## 2013-06-19 LAB — COMPREHENSIVE METABOLIC PANEL
ALT: 20 U/L (ref 0–35)
AST: 18 U/L (ref 0–37)
Albumin: 4.1 g/dL (ref 3.5–5.2)
Alkaline Phosphatase: 104 U/L (ref 39–117)
BUN: 14 mg/dL (ref 6–23)
CO2: 27 mEq/L (ref 19–32)
CREATININE: 0.82 mg/dL (ref 0.50–1.10)
Calcium: 9.9 mg/dL (ref 8.4–10.5)
Chloride: 103 mEq/L (ref 96–112)
Glucose, Bld: 102 mg/dL — ABNORMAL HIGH (ref 70–99)
Potassium: 4.6 mEq/L (ref 3.5–5.3)
Sodium: 138 mEq/L (ref 135–145)
Total Bilirubin: 0.5 mg/dL (ref 0.2–1.2)
Total Protein: 7.4 g/dL (ref 6.0–8.3)

## 2013-06-19 LAB — C-REACTIVE PROTEIN

## 2013-06-19 LAB — SEDIMENTATION RATE: SED RATE: 12 mm/h (ref 0–22)

## 2013-06-19 NOTE — Assessment & Plan Note (Signed)
Doing well with Keflex.  Will complete 6 months in June.  Will check crp, esr and cmp.  If all ok, can stop at the end of the 6 months.  Return PRN.

## 2013-06-19 NOTE — Progress Notes (Signed)
  Subjective:    Patient ID: [name] [name], female    DOB: 18-Aug-1950, 63 y.o.   MRN: [id number]  HPI  [name] [name] is a 63 y.o. female with history of bilateral knee prosthetic joint replacements, last done on 07/2010 who presented to ED with 4 day history of right knee pain, swelling and increasing warmth to touch to her right leg. It became increasingly painful unable to weight bear during ambulation. She denies recent trauma to leg, no fevers, but does endorse chills. seh states that she noticed having some red lesions on her lower right leg which she is unclear of the eitology. She went to the ED on 10/25 due to increasing symptoms and unable to be seen in the ambulatory setting. She was Found to have leukocytosis of 17.1 with 81%N, arthrocentesis showed total WBC of 65,300 and 98%PMNs, arthrocentesis cultures showing rare gpc. She did have concurrent blood cultures drawn which 2 sets and grew MSSA.       She completed 6 weeks of cefazolin and now is 5/6 months of keflex. Good tolerance.  Is having some knee pain and intermittent swelling at the bottom of the knee but no knee effusion or warmth.  Is to get an injection of knee due to pain.      Review of Systems  Constitutional: Negative for fever and chills.  Eyes: Negative for visual disturbance.  Respiratory: Negative for cough.   Cardiovascular: Negative for leg swelling.  Gastrointestinal: Negative for nausea, abdominal pain and diarrhea.  Musculoskeletal: Positive for arthralgias. Negative for joint swelling.  Neurological: Negative for dizziness, light-headedness and headaches.  Psychiatric/Behavioral: Negative for dysphoric mood.       Objective:   Physical Exam  Constitutional: She appears well-developed and well-nourished.  HENT:  Mouth/Throat: No oropharyngeal exudate.  Eyes: Right eye exhibits no discharge. Left eye exhibits no discharge. No scleral icterus.  Cardiovascular: Normal rate, regular rhythm and normal heart  sounds.   Musculoskeletal:  Right knee with no effusion, not warm  Skin: No rash noted.  Psychiatric: She has a normal mood and affect.          Assessment & Plan:

## 2013-06-20 ENCOUNTER — Telehealth: Payer: Self-pay | Admitting: *Deleted

## 2013-06-20 NOTE — Telephone Encounter (Signed)
Message copied by Georgena Spurling on Wed Jun 20, 2013 10:52 AM ------      Message from: Thayer Headings      Created: Wed Jun 20, 2013  9:53 AM       Please let her know that her labs look great (CRP, ESR) and continue with plan to finish next month keflex and stop.  Follow up PRN.  thanks ------

## 2013-06-20 NOTE — Telephone Encounter (Signed)
Patient notified

## 2013-06-21 DIAGNOSIS — M47817 Spondylosis without myelopathy or radiculopathy, lumbosacral region: Secondary | ICD-10-CM | POA: Diagnosis not present

## 2013-06-28 ENCOUNTER — Ambulatory Visit
Admission: RE | Admit: 2013-06-28 | Discharge: 2013-06-28 | Disposition: A | Discharge status: Home / Self Care | Payer: Medicaid Other | Source: Ambulatory Visit

## 2013-06-28 DIAGNOSIS — Z1231 Encounter for screening mammogram for malignant neoplasm of breast: Secondary | ICD-10-CM | POA: Diagnosis not present

## 2013-07-11 DIAGNOSIS — H4011X0 Primary open-angle glaucoma, stage unspecified: Secondary | ICD-10-CM | POA: Diagnosis not present

## 2013-08-27 ENCOUNTER — Other Ambulatory Visit: Payer: Self-pay | Admitting: Physician Assistant

## 2013-09-06 ENCOUNTER — Encounter (HOSPITAL_BASED_OUTPATIENT_CLINIC_OR_DEPARTMENT_OTHER): Payer: Self-pay

## 2013-09-06 ENCOUNTER — Ambulatory Visit (HOSPITAL_BASED_OUTPATIENT_CLINIC_OR_DEPARTMENT_OTHER): Admit: 2013-09-06 | Payer: Medicaid Other | Admitting: Orthopedic Surgery

## 2013-09-06 SURGERY — SHOULDER ARTHROSCOPY WITH SUBACROMIAL DECOMPRESSION, ROTATOR CUFF REPAIR AND BICEP TENDON REPAIR
Anesthesia: General | Site: Shoulder | Laterality: Right

## 2013-09-09 ENCOUNTER — Encounter (HOSPITAL_COMMUNITY): Payer: Self-pay | Admitting: Emergency Medicine

## 2013-09-09 ENCOUNTER — Emergency Department (HOSPITAL_COMMUNITY): Payer: Medicare Other

## 2013-09-09 ENCOUNTER — Emergency Department (HOSPITAL_COMMUNITY)
Admission: EM | Admit: 2013-09-09 | Discharge: 2013-09-10 | Disposition: A | Discharge status: Home / Self Care | Payer: Medicare Other | Attending: Emergency Medicine | Admitting: Emergency Medicine

## 2013-09-09 DIAGNOSIS — Z9889 Other specified postprocedural states: Secondary | ICD-10-CM | POA: Insufficient documentation

## 2013-09-09 DIAGNOSIS — I10 Essential (primary) hypertension: Secondary | ICD-10-CM | POA: Insufficient documentation

## 2013-09-09 DIAGNOSIS — X500XXA Overexertion from strenuous movement or load, initial encounter: Secondary | ICD-10-CM | POA: Insufficient documentation

## 2013-09-09 DIAGNOSIS — M25461 Effusion, right knee: Secondary | ICD-10-CM

## 2013-09-09 DIAGNOSIS — S99919A Unspecified injury of unspecified ankle, initial encounter: Secondary | ICD-10-CM | POA: Diagnosis not present

## 2013-09-09 DIAGNOSIS — Z792 Long term (current) use of antibiotics: Secondary | ICD-10-CM | POA: Diagnosis not present

## 2013-09-09 DIAGNOSIS — M25469 Effusion, unspecified knee: Secondary | ICD-10-CM | POA: Insufficient documentation

## 2013-09-09 DIAGNOSIS — Z8742 Personal history of other diseases of the female genital tract: Secondary | ICD-10-CM | POA: Insufficient documentation

## 2013-09-09 DIAGNOSIS — R509 Fever, unspecified: Secondary | ICD-10-CM | POA: Diagnosis not present

## 2013-09-09 DIAGNOSIS — Z7901 Long term (current) use of anticoagulants: Secondary | ICD-10-CM | POA: Diagnosis not present

## 2013-09-09 DIAGNOSIS — S8990XA Unspecified injury of unspecified lower leg, initial encounter: Secondary | ICD-10-CM | POA: Insufficient documentation

## 2013-09-09 DIAGNOSIS — Y9389 Activity, other specified: Secondary | ICD-10-CM | POA: Insufficient documentation

## 2013-09-09 DIAGNOSIS — S79929A Unspecified injury of unspecified thigh, initial encounter: Secondary | ICD-10-CM | POA: Diagnosis not present

## 2013-09-09 DIAGNOSIS — S79919A Unspecified injury of unspecified hip, initial encounter: Secondary | ICD-10-CM | POA: Diagnosis not present

## 2013-09-09 DIAGNOSIS — Z7982 Long term (current) use of aspirin: Secondary | ICD-10-CM | POA: Diagnosis not present

## 2013-09-09 DIAGNOSIS — Z87891 Personal history of nicotine dependence: Secondary | ICD-10-CM | POA: Insufficient documentation

## 2013-09-09 DIAGNOSIS — M25559 Pain in unspecified hip: Secondary | ICD-10-CM | POA: Diagnosis not present

## 2013-09-09 DIAGNOSIS — E785 Hyperlipidemia, unspecified: Secondary | ICD-10-CM | POA: Insufficient documentation

## 2013-09-09 DIAGNOSIS — S99929A Unspecified injury of unspecified foot, initial encounter: Principal | ICD-10-CM

## 2013-09-09 DIAGNOSIS — Z79899 Other long term (current) drug therapy: Secondary | ICD-10-CM | POA: Diagnosis not present

## 2013-09-09 DIAGNOSIS — Y9289 Other specified places as the place of occurrence of the external cause: Secondary | ICD-10-CM | POA: Insufficient documentation

## 2013-09-09 DIAGNOSIS — M25569 Pain in unspecified knee: Secondary | ICD-10-CM | POA: Diagnosis not present

## 2013-09-09 HISTORY — DX: Overactive bladder: N32.81

## 2013-09-09 LAB — CBC WITH DIFFERENTIAL/PLATELET
BASOS ABS: 0 10*3/uL (ref 0.0–0.1)
BASOS PCT: 0 % (ref 0–1)
EOS ABS: 0.1 10*3/uL (ref 0.0–0.7)
Eosinophils Relative: 1 % (ref 0–5)
HCT: 36.1 % (ref 36.0–46.0)
Hemoglobin: 11.2 g/dL — ABNORMAL LOW (ref 12.0–15.0)
Lymphocytes Relative: 17 % (ref 12–46)
Lymphs Abs: 2.4 10*3/uL (ref 0.7–4.0)
MCH: 26.3 pg (ref 26.0–34.0)
MCHC: 31 g/dL (ref 30.0–36.0)
MCV: 84.7 fL (ref 78.0–100.0)
MONOS PCT: 7 % (ref 3–12)
Monocytes Absolute: 0.9 10*3/uL (ref 0.1–1.0)
NEUTROS PCT: 75 % (ref 43–77)
Neutro Abs: 10.6 10*3/uL — ABNORMAL HIGH (ref 1.7–7.7)
PLATELETS: 686 10*3/uL — AB (ref 150–400)
RBC: 4.26 MIL/uL (ref 3.87–5.11)
RDW: 16.5 % — ABNORMAL HIGH (ref 11.5–15.5)
WBC: 14.1 10*3/uL — ABNORMAL HIGH (ref 4.0–10.5)

## 2013-09-09 LAB — BASIC METABOLIC PANEL
ANION GAP: 17 — AB (ref 5–15)
BUN: 25 mg/dL — ABNORMAL HIGH (ref 6–23)
CHLORIDE: 101 meq/L (ref 96–112)
CO2: 24 mEq/L (ref 19–32)
Calcium: 10.3 mg/dL (ref 8.4–10.5)
Creatinine, Ser: 0.98 mg/dL (ref 0.50–1.10)
GFR calc Af Amer: 70 mL/min — ABNORMAL LOW (ref >90)
GFR, EST NON AFRICAN AMERICAN: 61 mL/min — AB (ref >90)
Glucose, Bld: 115 mg/dL — ABNORMAL HIGH (ref 70–99)
Potassium: 3.8 mEq/L (ref 3.7–5.3)
SODIUM: 142 meq/L (ref 137–147)

## 2013-09-09 LAB — SEDIMENTATION RATE: SED RATE: 120 mm/h — AB (ref 0–22)

## 2013-09-09 LAB — CBG MONITORING, ED: GLUCOSE-CAPILLARY: 125 mg/dL — AB (ref 70–99)

## 2013-09-09 MED ORDER — OXYCODONE-ACETAMINOPHEN 5-325 MG PO TABS
2.0000 | ORAL_TABLET | Freq: Once | ORAL | Status: AC
  Administered 2013-09-09: 2 via ORAL
  Filled 2013-09-09: qty 2

## 2013-09-09 MED ORDER — OXYCODONE-ACETAMINOPHEN 5-325 MG PO TABS
1.0000 | ORAL_TABLET | Freq: Once | ORAL | Status: AC
  Administered 2013-09-09: 1 via ORAL
  Filled 2013-09-09: qty 1

## 2013-09-09 MED ORDER — OXYCODONE-ACETAMINOPHEN 5-325 MG PO TABS: 2.0000 | ORAL_TABLET | Freq: Four times a day (QID) | ORAL | Status: DC | PRN

## 2013-09-09 NOTE — Discharge Instructions (Signed)

## 2013-09-09 NOTE — ED Notes (Signed)
Pt from home via EMS with c/o family tried to assist pt out of recliner and her right leg bent behind her. Pt has history of right knee pain x 2 weeks. Pain is same as past 2 weeks pt just wants to be checked due to sliding out of recliner. Pt takes flexeril daily.

## 2013-09-09 NOTE — ED Notes (Signed)
Consent for procedure re: right knee fluid aspiration completed.  Rob PA at bedside to perform procedure.

## 2013-09-09 NOTE — ED Provider Notes (Signed)
CSN: [id number]     Arrival date & time 09/09/13  1809 History   First MD Initiated Contact with Patient 09/09/13 1811     Chief Complaint  Patient presents with  . Fall  . Knee Pain     (Consider location/radiation/quality/duration/timing/severity/associated sxs/prior Treatment) HPI Comments: Patient presents to the emergency department with chief complaint of right knee pain. She states that she fell out of her recliner, and landed on her knee, also twisting her knee today. She states the pain has been worse for the past 2 weeks. She states that she had a TKA approximately one year ago, which subsequently became infected. She has been being followed by infectious disease for the past 6-8 months. She states 2 months in an inpatient rehabilitation center, getting IV antibiotics. For the past 6 months she has been taking Keflex daily. She took her last Keflex dose July 1. She states that the pain in her knee has never returned to baseline. She reports intermittent fevers and chills, but denies measuring temperature. She states that she has not walked on her knee for the past week because of the pain. She takes Flexeril daily for pain, but states that is no longer helping.  The history is provided by the patient. No language interpreter was used.    Past Medical History  Diagnosis Date  . Hyperlipidemia   . Hypertension   . S/P right knee arthroscopy     with septic joint growing out MSSA  . Septic arthritis of knee, right   . Overactive bladder    Past Surgical History  Procedure Laterality Date  . Joint replacement    . Knee surgery Bilateral   . Knee arthroscopy Right 12/09/2012    Procedure: ARTHROSCOPIC IRRIGATION AND DEBRIDEMENT RIGHT KNEE;  Surgeon: Budd Palmer, MD;  Location: [facility] OR;  Service: Orthopedics;  Laterality: Right;   No family history on file. History  Substance Use Topics  . Smoking status: Former Smoker    Quit date: 08/01/1992  . Smokeless tobacco: Never  Used     Comment: pt. does not smoke  . Alcohol Use: No   OB History   Grav Para Term Preterm Abortions TAB SAB Ect Mult Living                 Review of Systems  Constitutional: Positive for fever and chills.  Musculoskeletal: Positive for arthralgias. Negative for joint swelling.  All other systems reviewed and are negative.     Allergies  Review of patient's allergies indicates no known allergies.  Home Medications   Prior to Admission medications   Medication Sig Start Date End Date Taking? Authorizing Provider  amLODipine-valsartan (EXFORGE) 10-160 MG per tablet Take 1 tablet by mouth daily.    Historical Provider, MD  aspirin 81 MG tablet Take 81 mg by mouth daily.    Historical Provider, MD  atorvastatin (LIPITOR) 40 MG tablet Take 40 mg by mouth daily.    Historical Provider, MD  cephALEXin (KEFLEX) 500 MG capsule Take 1 capsule (500 mg total) by mouth 4 (four) times daily. 01/18/13   Gardiner Barefoot, MD  cyclobenzaprine (FLEXERIL) 10 MG tablet Take 1 tablet (10 mg total) by mouth 3 (three) times daily as needed for muscle spasms. 12/01/12   Roma Kayser Schorr, NP  enoxaparin (LOVENOX) 40 MG/0.4ML injection Inject 0.4 mLs (40 mg total) into the skin daily. 12/15/12   Wilkie Aye, PA-C  HYDROcodone-acetaminophen (NORCO/VICODIN) 5-325 MG per tablet Take one tablet  by mouth every four hours as needed for moderate pain; Take two tablets by mouth every four hours as needed for severe pain 12/25/12   Tiffany L Reed, DO  nebivolol (BYSTOLIC) 10 MG tablet Take 10 mg by mouth daily.    Historical Provider, MD  traMADol (ULTRAM) 50 MG tablet Take 1 tablet (50 mg total) by mouth every 6 (six) hours as needed. 12/26/12   Kimber Relic, MD   BP 116/59  Pulse 86  Resp 20  Ht 5\' 5"  (1.651 m)  Wt 300 lb (136.079 kg)  BMI 49.92 kg/m2  SpO2 97% Physical Exam  Nursing note and vitals reviewed. Constitutional: She is oriented to person, place, and time. She appears  well-developed and well-nourished.  Morbidly obese  HENT:  Head: Normocephalic and atraumatic.  Eyes: Conjunctivae and EOM are normal.  Neck: Normal range of motion.  Cardiovascular: Normal rate.   Pulmonary/Chest: Effort normal.  Abdominal: She exhibits no distension.  Musculoskeletal: Normal range of motion.  Right knee tenderness palpation, midline scar from TKA, no evidence of cellulitis, no swelling, no evidence of DVT, range of motion and strength are deferred secondary to pain  Right hip is also tender to palpation, but no obvious bony abnormality or deformity, range of motion strength also deferred secondary to pain  Neurological: She is alert and oriented to person, place, and time.  Skin: Skin is dry.  Psychiatric: She has a normal mood and affect. Her behavior is normal. Judgment and thought content normal.    ED Course  Fine needle aspiration Date/Time: 09/09/2013 10:36 PM Performed by: Roxy Horseman Authorized by: Roxy Horseman Consent: Verbal consent obtained. written consent obtained. The procedure was performed in an emergent situation. Risks and benefits: risks, benefits and alternatives were discussed Consent given by: patient Patient understanding: patient states understanding of the procedure being performed Patient consent: the patient's understanding of the procedure matches consent given Procedure consent: procedure consent matches procedure scheduled Relevant documents: relevant documents present and verified Test results: test results available and properly labeled Site marked: the operative site was marked Imaging studies: imaging studies available Required items: required blood products, implants, devices, and special equipment available Patient identity confirmed: verbally with patient and arm band Time out: Immediately prior to procedure a "time out" was called to verify the correct patient, procedure, equipment, support staff and site/side marked  as required. Preparation: Patient was prepped and draped in the usual sterile fashion. Local anesthesia used: yes Anesthesia: local infiltration Local anesthetic: lidocaine 2% without epinephrine Anesthetic total: 5 ml Patient sedated: no Patient tolerance: Patient tolerated the procedure well with no immediate complications.   (including critical care time) Results for orders placed during the hospital encounter of 09/09/13  CBC WITH DIFFERENTIAL      Result Value Ref Range   WBC 14.1 (*) 4.0 - 10.5 K/uL   RBC 4.26  3.87 - 5.11 MIL/uL   Hemoglobin 11.2 (*) 12.0 - 15.0 g/dL   HCT 16.1  09.6 - 04.5 %   MCV 84.7  78.0 - 100.0 fL   MCH 26.3  26.0 - 34.0 pg   MCHC 31.0  30.0 - 36.0 g/dL   RDW 40.9 (*) 81.1 - 91.4 %   Platelets 686 (*) 150 - 400 K/uL   Neutrophils Relative % 75  43 - 77 %   Neutro Abs 10.6 (*) 1.7 - 7.7 K/uL   Lymphocytes Relative 17  12 - 46 %   Lymphs Abs 2.4  0.7 -  4.0 K/uL   Monocytes Relative 7  3 - 12 %   Monocytes Absolute 0.9  0.1 - 1.0 K/uL   Eosinophils Relative 1  0 - 5 %   Eosinophils Absolute 0.1  0.0 - 0.7 K/uL   Basophils Relative 0  0 - 1 %   Basophils Absolute 0.0  0.0 - 0.1 K/uL  BASIC METABOLIC PANEL      Result Value Ref Range   Sodium 142  137 - 147 mEq/L   Potassium 3.8  3.7 - 5.3 mEq/L   Chloride 101  96 - 112 mEq/L   CO2 24  19 - 32 mEq/L   Glucose, Bld 115 (*) 70 - 99 mg/dL   BUN 25 (*) 6 - 23 mg/dL   Creatinine, Ser 2.130.98  0.50 - 1.10 mg/dL   Calcium 08.610.3  8.4 - 57.810.5 mg/dL   GFR calc non Af Amer 61 (*) >90 mL/min   GFR calc Af Amer 70 (*) >90 mL/min   Anion gap 17 (*) 5 - 15   Dg Hip Complete Right  09/09/2013   CLINICAL DATA:  Right hip and knee pain secondary to a fall tonight.  EXAM: RIGHT HIP - COMPLETE 2+ VIEW  COMPARISON:  None.  FINDINGS: There is no fracture or dislocation. 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 With similar findings on the left. Pelvic bones appear normal.  IMPRESSION: No acute  abnormality.   Electronically Signed   By: Geanie CooleyJim  Maxwell M.D.   On: 09/09/2013 20:48   Dg Knee Complete 4 Views Right  09/09/2013   CLINICAL DATA:  Severe right knee pain mostly laterally  EXAM: RIGHT KNEE - COMPLETE 4+ VIEW  COMPARISON:  12/09/2012  FINDINGS: Tricompartmental knee prosthesis. On the lateral view, a moderate to large joint effusion has developed. The patella appears progressively sclerotic with thickening in somewhat poor definition of the posterior cortex of the patella. There is no fracture or dislocation. The femoral and tibial components of the implant appear in anticipated position.  IMPRESSION: Interval development of a moderate to large anterior joint effusion. There is also progressive sclerosis of the patella. The possibility of a septic joint with osteomyelitis of the patella is not excluded.   Electronically Signed   By: Esperanza Heiraymond  Rubner M.D.   On: 09/09/2013 20:46      EKG Interpretation None      MDM   Final diagnoses:  Knee effusion, right   Patient with right knee pain.  Pain has been increasing over the past 2 weeks.  Recently taken off keflex for septic joint.  No fevers.  Pain with movement.  No obvious effusion on PE.  9:32 PM Patient discussed with Dr. Dion SaucierLandau, who recommends aspirating the knee, restarting keflex, and follow-up with Dr. Eulah PontMurphy on Tuesday.  Dr. 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  States that the hardware will need to be removed.   10:33 PM Unsuccessful at aspirating the knee.  Dr. Gwendolyn GrantWalden called to bedside and also unable to aspirate fluid gross synovial fluid.  A small amount of blood was aspirated.  Possible hematoma from fall.  Plan remains unchanged.  DC to home with instructions to follow-up with Dr. Eulah PontMurphy on Tuesday.  Will restart abx.  Roxy Horsemanobert Morrison Mcbryar, PA-C 09/09/13 2337

## 2013-09-10 DIAGNOSIS — M25569 Pain in unspecified knee: Secondary | ICD-10-CM | POA: Diagnosis not present

## 2013-09-10 DIAGNOSIS — S8990XA Unspecified injury of unspecified lower leg, initial encounter: Secondary | ICD-10-CM | POA: Diagnosis not present

## 2013-09-10 DIAGNOSIS — S99919A Unspecified injury of unspecified ankle, initial encounter: Secondary | ICD-10-CM | POA: Diagnosis not present

## 2013-09-10 LAB — C-REACTIVE PROTEIN: CRP: 11.8 mg/dL — AB (ref <0.60)

## 2013-09-10 NOTE — ED Provider Notes (Signed)
Medical screening examination/treatment/procedure(s) were conducted as a shared visit with non-physician practitioner(s) and myself.  I personally evaluated the patient during the encounter.   EKG Interpretation None      Patient here with knee pain s/p fall. Hx of knee infection, recently stopper her long time keflex and swelling returned. Xrays normal. Ortho recommended tapping the joint - only blood on our tap - could be hemarthrosis due to trauma. Will restart keflex on Ortho recommendations. Given DME to help with ambulation.  Dagmar HaitWilliam Kimiye Strathman, MD 09/10/13 0030

## 2013-09-18 DIAGNOSIS — M25569 Pain in unspecified knee: Secondary | ICD-10-CM | POA: Diagnosis not present

## 2014-02-01 ENCOUNTER — Other Ambulatory Visit: Payer: Self-pay | Admitting: Orthopedic Surgery

## 2014-02-01 DIAGNOSIS — M545 Low back pain: Secondary | ICD-10-CM | POA: Diagnosis not present

## 2014-02-01 DIAGNOSIS — M25551 Pain in right hip: Secondary | ICD-10-CM | POA: Diagnosis not present

## 2014-02-01 DIAGNOSIS — M25561 Pain in right knee: Secondary | ICD-10-CM | POA: Diagnosis not present

## 2014-02-12 ENCOUNTER — Other Ambulatory Visit: Payer: Medicaid Other

## 2014-02-12 ENCOUNTER — Ambulatory Visit
Admission: RE | Admit: 2014-02-12 | Discharge: 2014-02-12 | Disposition: A | Discharge status: Home / Self Care | Payer: Medicare Other | Source: Ambulatory Visit | Attending: Orthopedic Surgery | Admitting: Orthopedic Surgery

## 2014-02-12 DIAGNOSIS — M545 Low back pain: Secondary | ICD-10-CM

## 2014-02-12 DIAGNOSIS — S3992XA Unspecified injury of lower back, initial encounter: Secondary | ICD-10-CM | POA: Diagnosis not present

## 2014-03-04 ENCOUNTER — Other Ambulatory Visit: Payer: Self-pay | Admitting: Physical Medicine and Rehabilitation

## 2014-03-04 DIAGNOSIS — M545 Low back pain: Secondary | ICD-10-CM | POA: Diagnosis not present

## 2014-03-04 DIAGNOSIS — M25561 Pain in right knee: Secondary | ICD-10-CM | POA: Diagnosis not present

## 2014-03-04 DIAGNOSIS — IMO0002 Reserved for concepts with insufficient information to code with codable children: Secondary | ICD-10-CM

## 2014-03-04 DIAGNOSIS — N949 Unspecified condition associated with female genital organs and menstrual cycle: Secondary | ICD-10-CM

## 2014-03-04 DIAGNOSIS — M7061 Trochanteric bursitis, right hip: Secondary | ICD-10-CM | POA: Diagnosis not present

## 2014-03-06 ENCOUNTER — Other Ambulatory Visit: Payer: Medicaid Other

## 2014-03-06 ENCOUNTER — Ambulatory Visit
Admission: RE | Admit: 2014-03-06 | Discharge: 2014-03-06 | Disposition: A | Discharge status: Home / Self Care | Payer: Medicare Other | Source: Ambulatory Visit | Attending: Physical Medicine and Rehabilitation | Admitting: Physical Medicine and Rehabilitation

## 2014-03-06 DIAGNOSIS — N949 Unspecified condition associated with female genital organs and menstrual cycle: Secondary | ICD-10-CM

## 2014-03-06 DIAGNOSIS — N8320 Unspecified ovarian cysts: Secondary | ICD-10-CM | POA: Diagnosis not present

## 2014-03-22 DIAGNOSIS — M545 Low back pain: Secondary | ICD-10-CM | POA: Diagnosis not present

## 2014-03-22 DIAGNOSIS — M7061 Trochanteric bursitis, right hip: Secondary | ICD-10-CM | POA: Diagnosis not present

## 2014-03-29 ENCOUNTER — Other Ambulatory Visit (HOSPITAL_COMMUNITY): Payer: Self-pay | Admitting: Orthopedic Surgery

## 2014-03-29 DIAGNOSIS — M545 Low back pain: Secondary | ICD-10-CM | POA: Diagnosis not present

## 2014-03-29 DIAGNOSIS — M009 Pyogenic arthritis, unspecified: Secondary | ICD-10-CM | POA: Diagnosis not present

## 2014-03-29 DIAGNOSIS — T84032S Mechanical loosening of internal right knee prosthetic joint, sequela: Secondary | ICD-10-CM

## 2014-03-29 DIAGNOSIS — M25561 Pain in right knee: Secondary | ICD-10-CM | POA: Diagnosis not present

## 2014-04-02 ENCOUNTER — Encounter (HOSPITAL_COMMUNITY): Payer: Medicare Other

## 2014-04-15 ENCOUNTER — Encounter (HOSPITAL_COMMUNITY): Payer: Medicare Other

## 2014-04-22 ENCOUNTER — Encounter (HOSPITAL_COMMUNITY)
Admission: RE | Admit: 2014-04-22 | Discharge: 2014-04-22 | Disposition: A | Discharge status: Home / Self Care | Payer: Medicare Other | Source: Ambulatory Visit | Attending: Orthopedic Surgery | Admitting: Orthopedic Surgery

## 2014-04-22 DIAGNOSIS — M25561 Pain in right knee: Secondary | ICD-10-CM | POA: Insufficient documentation

## 2014-04-22 DIAGNOSIS — Z471 Aftercare following joint replacement surgery: Secondary | ICD-10-CM | POA: Diagnosis not present

## 2014-04-22 DIAGNOSIS — Z96651 Presence of right artificial knee joint: Secondary | ICD-10-CM | POA: Diagnosis not present

## 2014-04-22 DIAGNOSIS — T84032S Mechanical loosening of internal right knee prosthetic joint, sequela: Secondary | ICD-10-CM | POA: Insufficient documentation

## 2014-04-22 MED ORDER — TECHNETIUM TC 99M MEDRONATE IV KIT
25.9000 | PACK | Freq: Once | INTRAVENOUS | Status: AC | PRN
  Administered 2014-04-22: 25.9 via INTRAVENOUS

## 2014-04-26 DIAGNOSIS — M25561 Pain in right knee: Secondary | ICD-10-CM | POA: Diagnosis not present

## 2014-04-26 DIAGNOSIS — M7061 Trochanteric bursitis, right hip: Secondary | ICD-10-CM | POA: Diagnosis not present

## 2014-04-26 DIAGNOSIS — M545 Low back pain: Secondary | ICD-10-CM | POA: Diagnosis not present

## 2014-05-06 DIAGNOSIS — E782 Mixed hyperlipidemia: Secondary | ICD-10-CM | POA: Diagnosis not present

## 2014-05-06 DIAGNOSIS — I10 Essential (primary) hypertension: Secondary | ICD-10-CM | POA: Diagnosis not present

## 2014-05-06 DIAGNOSIS — M1990 Unspecified osteoarthritis, unspecified site: Secondary | ICD-10-CM | POA: Diagnosis not present

## 2014-05-06 DIAGNOSIS — E119 Type 2 diabetes mellitus without complications: Secondary | ICD-10-CM | POA: Diagnosis not present

## 2014-05-06 DIAGNOSIS — N3281 Overactive bladder: Secondary | ICD-10-CM | POA: Diagnosis not present

## 2014-05-06 DIAGNOSIS — Z6841 Body Mass Index (BMI) 40.0 and over, adult: Secondary | ICD-10-CM | POA: Diagnosis not present

## 2014-05-14 DIAGNOSIS — R509 Fever, unspecified: Secondary | ICD-10-CM | POA: Diagnosis not present

## 2014-05-14 DIAGNOSIS — J101 Influenza due to other identified influenza virus with other respiratory manifestations: Secondary | ICD-10-CM | POA: Diagnosis not present

## 2014-05-14 DIAGNOSIS — R52 Pain, unspecified: Secondary | ICD-10-CM | POA: Diagnosis not present

## 2014-05-14 DIAGNOSIS — R0989 Other specified symptoms and signs involving the circulatory and respiratory systems: Secondary | ICD-10-CM | POA: Diagnosis not present

## 2014-05-23 ENCOUNTER — Other Ambulatory Visit (HOSPITAL_COMMUNITY): Payer: Medicare Other

## 2014-06-05 ENCOUNTER — Inpatient Hospital Stay (HOSPITAL_COMMUNITY): Admit: 2014-06-05 | Payer: Medicare Other | Admitting: Orthopedic Surgery

## 2014-06-05 ENCOUNTER — Encounter (HOSPITAL_COMMUNITY): Payer: Self-pay

## 2014-06-05 SURGERY — TOTAL KNEE REVISION
Anesthesia: General | Laterality: Right

## 2014-06-11 DIAGNOSIS — R319 Hematuria, unspecified: Secondary | ICD-10-CM | POA: Diagnosis not present

## 2014-06-11 DIAGNOSIS — Z6841 Body Mass Index (BMI) 40.0 and over, adult: Secondary | ICD-10-CM | POA: Diagnosis not present

## 2014-06-11 DIAGNOSIS — R7309 Other abnormal glucose: Secondary | ICD-10-CM | POA: Diagnosis not present

## 2014-06-11 DIAGNOSIS — E789 Disorder of lipoprotein metabolism, unspecified: Secondary | ICD-10-CM | POA: Diagnosis not present

## 2014-06-11 DIAGNOSIS — I10 Essential (primary) hypertension: Secondary | ICD-10-CM | POA: Diagnosis not present

## 2014-06-25 DIAGNOSIS — Z6841 Body Mass Index (BMI) 40.0 and over, adult: Secondary | ICD-10-CM | POA: Diagnosis not present

## 2014-06-25 DIAGNOSIS — I10 Essential (primary) hypertension: Secondary | ICD-10-CM | POA: Diagnosis not present

## 2014-06-25 DIAGNOSIS — E789 Disorder of lipoprotein metabolism, unspecified: Secondary | ICD-10-CM | POA: Diagnosis not present

## 2014-06-25 DIAGNOSIS — R7309 Other abnormal glucose: Secondary | ICD-10-CM | POA: Diagnosis not present

## 2014-07-09 DIAGNOSIS — R7309 Other abnormal glucose: Secondary | ICD-10-CM | POA: Diagnosis not present

## 2014-07-09 DIAGNOSIS — Z6841 Body Mass Index (BMI) 40.0 and over, adult: Secondary | ICD-10-CM | POA: Diagnosis not present

## 2014-07-09 DIAGNOSIS — E789 Disorder of lipoprotein metabolism, unspecified: Secondary | ICD-10-CM | POA: Diagnosis not present

## 2014-07-09 DIAGNOSIS — I10 Essential (primary) hypertension: Secondary | ICD-10-CM | POA: Diagnosis not present

## 2014-07-18 DIAGNOSIS — E789 Disorder of lipoprotein metabolism, unspecified: Secondary | ICD-10-CM | POA: Diagnosis not present

## 2014-07-18 DIAGNOSIS — R7309 Other abnormal glucose: Secondary | ICD-10-CM | POA: Diagnosis not present

## 2014-07-18 DIAGNOSIS — Z6841 Body Mass Index (BMI) 40.0 and over, adult: Secondary | ICD-10-CM | POA: Diagnosis not present

## 2014-07-18 DIAGNOSIS — I10 Essential (primary) hypertension: Secondary | ICD-10-CM | POA: Diagnosis not present

## 2014-08-16 DIAGNOSIS — E119 Type 2 diabetes mellitus without complications: Secondary | ICD-10-CM | POA: Diagnosis not present

## 2014-08-16 DIAGNOSIS — E669 Obesity, unspecified: Secondary | ICD-10-CM | POA: Diagnosis not present

## 2014-08-16 DIAGNOSIS — I10 Essential (primary) hypertension: Secondary | ICD-10-CM | POA: Diagnosis not present

## 2014-08-16 DIAGNOSIS — E785 Hyperlipidemia, unspecified: Secondary | ICD-10-CM | POA: Diagnosis not present

## 2014-08-16 DIAGNOSIS — R7309 Other abnormal glucose: Secondary | ICD-10-CM | POA: Diagnosis not present

## 2014-08-27 ENCOUNTER — Ambulatory Visit: Payer: Medicare Other | Admitting: Family Medicine

## 2014-10-28 IMAGING — MG MM DIGITAL SCREENING BILAT
8 of 9 series · 8 of 9 positions shown · non-contrast
Comparison: Previous exams.

CLINICAL DATA: Screening.

DIGITAL BILATERAL SCREENING MAMMOGRAM WITH CAD

[R CC (1 of 2)]
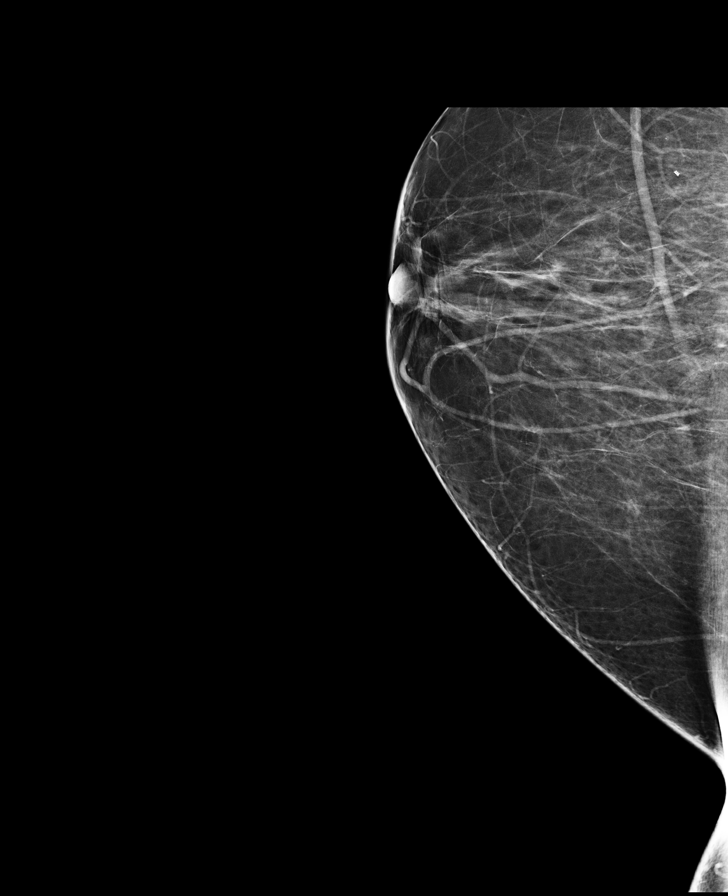

[R MLO (1 of 2)]
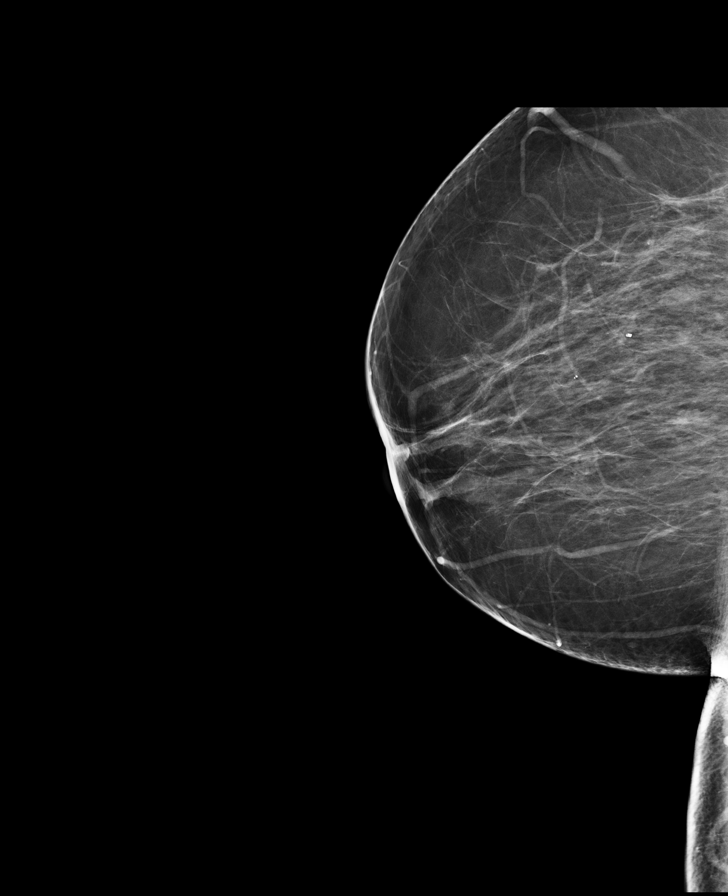

[L CC (1 of 3)]
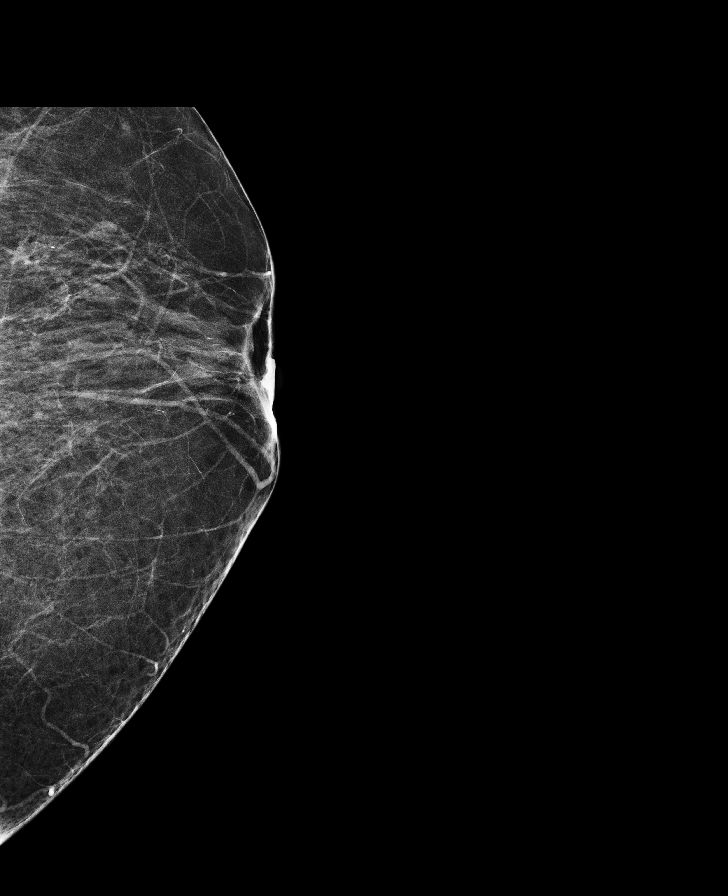

[L CC (2 of 3)]
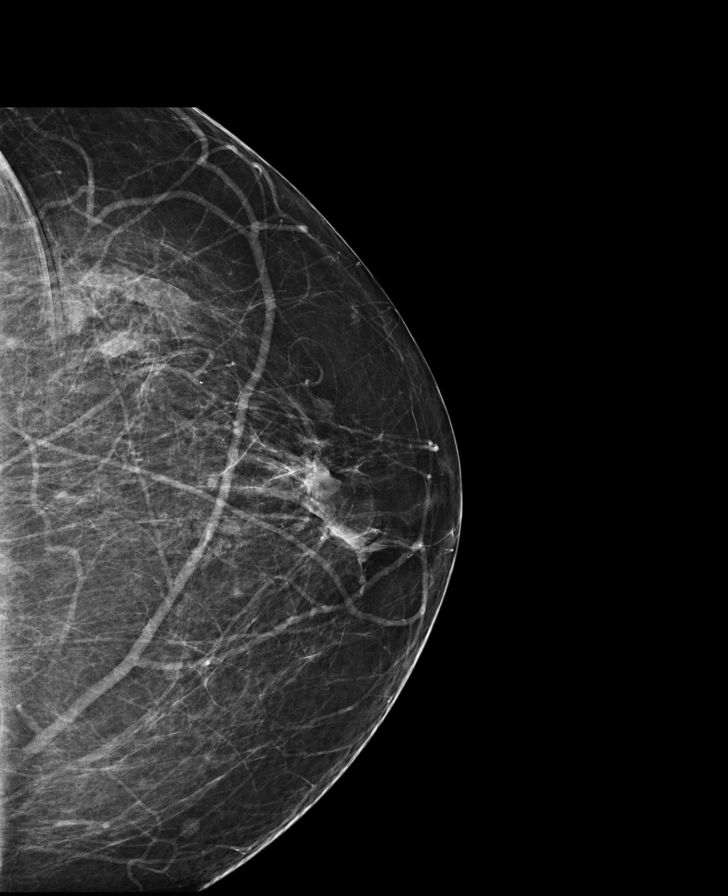

[L CC (3 of 3)]
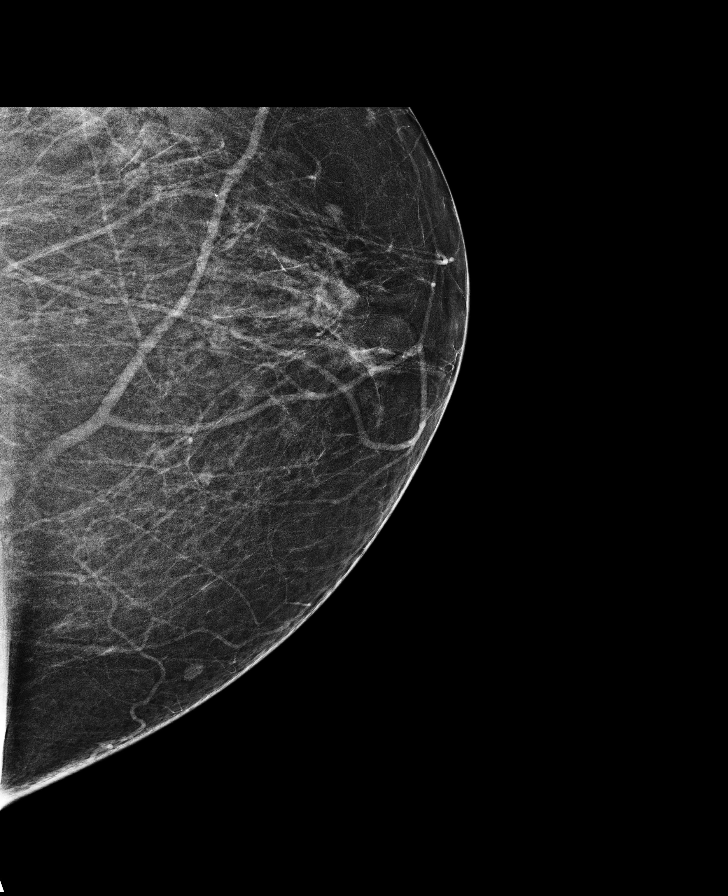

[R MLO (2 of 2)]
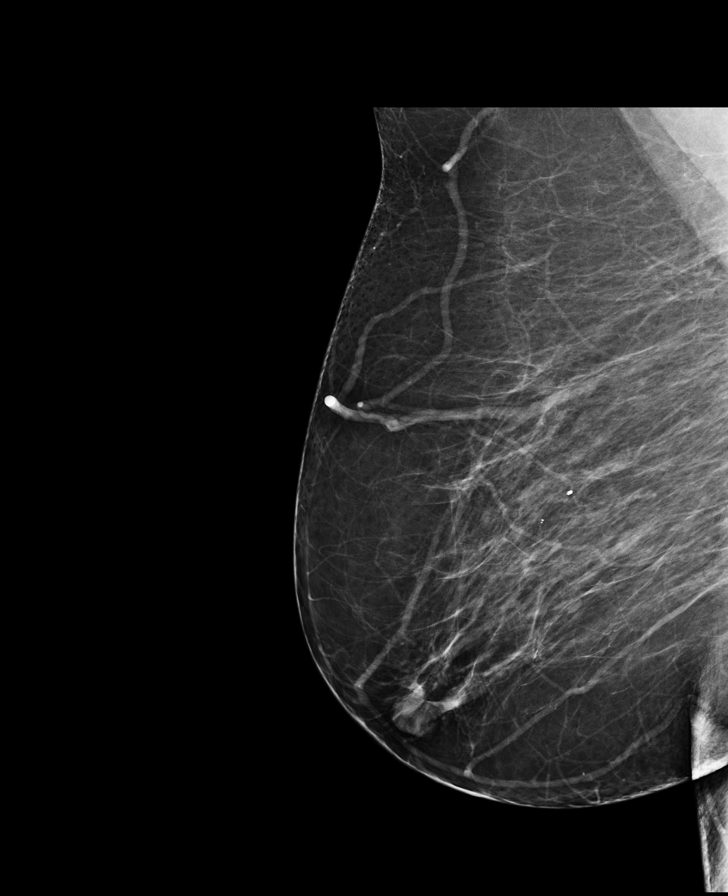

[L MLO]
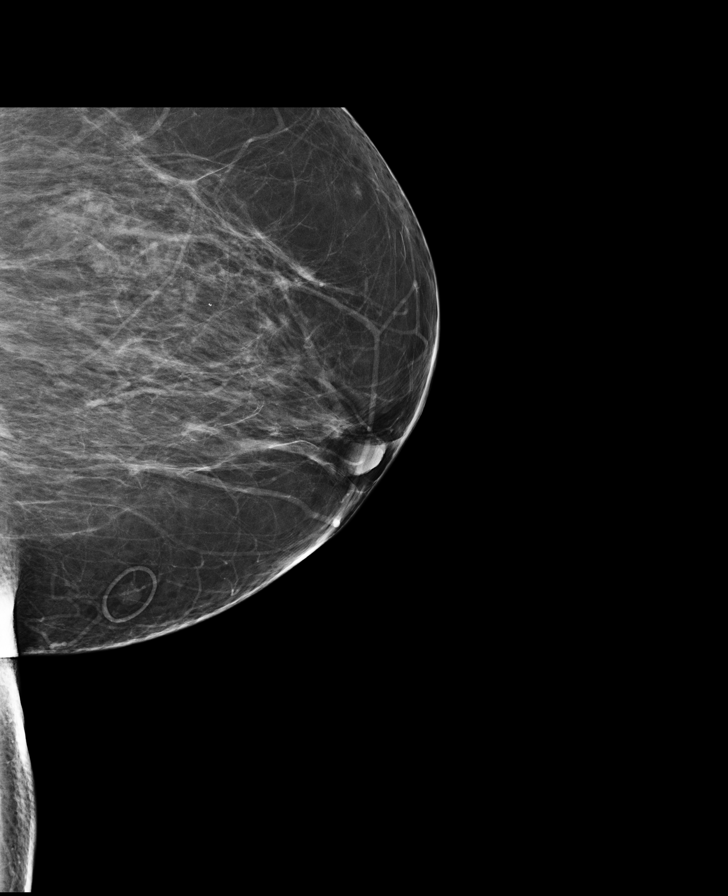

[R CC (2 of 2)]
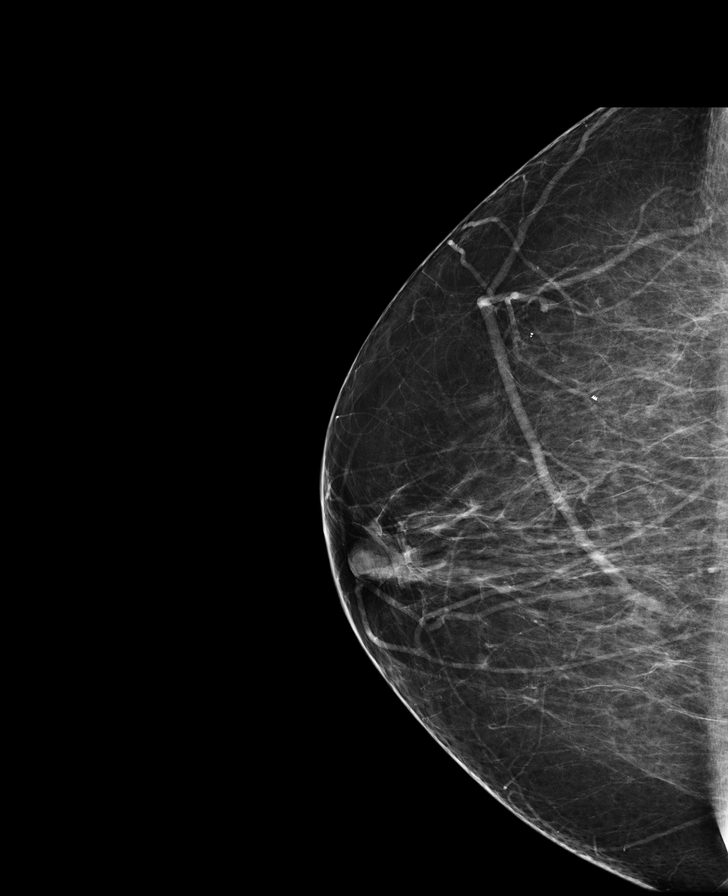

[8 of 9 positions shown; findings below may reference images not displayed]

FINDINGS: ACR Breast Density Category 2: There is a scattered fibroglandular
pattern.

No suspicious masses, architectural distortion, or calcifications
are present.

Images were processed with CAD.
IMPRESSION: No mammographic evidence of malignancy.

A result letter of this screening mammogram will be mailed directly
to the patient.

RECOMMENDATION:
Screening mammogram in one year. (Code:LB-[id number])

BI-RADS CATEGORY 1:  Negative.

## 2014-11-04 ENCOUNTER — Inpatient Hospital Stay (HOSPITAL_COMMUNITY): Payer: Medicare Other

## 2014-11-04 ENCOUNTER — Encounter (HOSPITAL_COMMUNITY): Payer: Self-pay | Admitting: Emergency Medicine

## 2014-11-04 ENCOUNTER — Inpatient Hospital Stay (HOSPITAL_COMMUNITY)
Admission: EM | Admit: 2014-11-04 | Discharge: 2014-11-09 | DRG: 464 | Disposition: A | Discharge status: Skilled Nursing Facility | Payer: Medicare Other | Attending: Internal Medicine | Admitting: Internal Medicine

## 2014-11-04 ENCOUNTER — Emergency Department (HOSPITAL_COMMUNITY): Payer: Medicare Other

## 2014-11-04 DIAGNOSIS — Z6841 Body Mass Index (BMI) 40.0 and over, adult: Secondary | ICD-10-CM

## 2014-11-04 DIAGNOSIS — N3281 Overactive bladder: Secondary | ICD-10-CM | POA: Diagnosis present

## 2014-11-04 DIAGNOSIS — Z96653 Presence of artificial knee joint, bilateral: Secondary | ICD-10-CM | POA: Diagnosis present

## 2014-11-04 DIAGNOSIS — Y792 Prosthetic and other implants, materials and accessory orthopedic devices associated with adverse incidents: Secondary | ICD-10-CM | POA: Diagnosis present

## 2014-11-04 DIAGNOSIS — R2689 Other abnormalities of gait and mobility: Secondary | ICD-10-CM | POA: Diagnosis not present

## 2014-11-04 DIAGNOSIS — Z7982 Long term (current) use of aspirin: Secondary | ICD-10-CM

## 2014-11-04 DIAGNOSIS — M19011 Primary osteoarthritis, right shoulder: Secondary | ICD-10-CM | POA: Diagnosis present

## 2014-11-04 DIAGNOSIS — R630 Anorexia: Secondary | ICD-10-CM | POA: Diagnosis present

## 2014-11-04 DIAGNOSIS — M869 Osteomyelitis, unspecified: Secondary | ICD-10-CM

## 2014-11-04 DIAGNOSIS — M25561 Pain in right knee: Secondary | ICD-10-CM

## 2014-11-04 DIAGNOSIS — M25461 Effusion, right knee: Secondary | ICD-10-CM | POA: Diagnosis not present

## 2014-11-04 DIAGNOSIS — Z8619 Personal history of other infectious and parasitic diseases: Secondary | ICD-10-CM

## 2014-11-04 DIAGNOSIS — G8929 Other chronic pain: Secondary | ICD-10-CM | POA: Diagnosis not present

## 2014-11-04 DIAGNOSIS — R52 Pain, unspecified: Secondary | ICD-10-CM

## 2014-11-04 DIAGNOSIS — T8453XD Infection and inflammatory reaction due to internal right knee prosthesis, subsequent encounter: Secondary | ICD-10-CM

## 2014-11-04 DIAGNOSIS — M00861 Arthritis due to other bacteria, right knee: Secondary | ICD-10-CM | POA: Diagnosis present

## 2014-11-04 DIAGNOSIS — Z79899 Other long term (current) drug therapy: Secondary | ICD-10-CM

## 2014-11-04 DIAGNOSIS — R7309 Other abnormal glucose: Secondary | ICD-10-CM | POA: Diagnosis present

## 2014-11-04 DIAGNOSIS — M6281 Muscle weakness (generalized): Secondary | ICD-10-CM | POA: Diagnosis not present

## 2014-11-04 DIAGNOSIS — E785 Hyperlipidemia, unspecified: Secondary | ICD-10-CM | POA: Diagnosis not present

## 2014-11-04 DIAGNOSIS — Z96659 Presence of unspecified artificial knee joint: Secondary | ICD-10-CM

## 2014-11-04 DIAGNOSIS — D62 Acute posthemorrhagic anemia: Secondary | ICD-10-CM | POA: Diagnosis not present

## 2014-11-04 DIAGNOSIS — M009 Pyogenic arthritis, unspecified: Secondary | ICD-10-CM | POA: Insufficient documentation

## 2014-11-04 DIAGNOSIS — T8459XA Infection and inflammatory reaction due to other internal joint prosthesis, initial encounter: Secondary | ICD-10-CM

## 2014-11-04 DIAGNOSIS — T8131XA Disruption of external operation (surgical) wound, not elsewhere classified, initial encounter: Secondary | ICD-10-CM | POA: Diagnosis not present

## 2014-11-04 DIAGNOSIS — T8450XD Infection and inflammatory reaction due to unspecified internal joint prosthesis, subsequent encounter: Secondary | ICD-10-CM | POA: Diagnosis not present

## 2014-11-04 DIAGNOSIS — B9561 Methicillin susceptible Staphylococcus aureus infection as the cause of diseases classified elsewhere: Secondary | ICD-10-CM | POA: Diagnosis present

## 2014-11-04 DIAGNOSIS — T859XXA Unspecified complication of internal prosthetic device, implant and graft, initial encounter: Secondary | ICD-10-CM | POA: Insufficient documentation

## 2014-11-04 DIAGNOSIS — Z9119 Patient's noncompliance with other medical treatment and regimen: Secondary | ICD-10-CM | POA: Diagnosis present

## 2014-11-04 DIAGNOSIS — B9689 Other specified bacterial agents as the cause of diseases classified elsewhere: Secondary | ICD-10-CM | POA: Diagnosis not present

## 2014-11-04 DIAGNOSIS — T8189XA Other complications of procedures, not elsewhere classified, initial encounter: Secondary | ICD-10-CM | POA: Diagnosis not present

## 2014-11-04 DIAGNOSIS — M25562 Pain in left knee: Secondary | ICD-10-CM

## 2014-11-04 DIAGNOSIS — Z4789 Encounter for other orthopedic aftercare: Secondary | ICD-10-CM | POA: Diagnosis not present

## 2014-11-04 DIAGNOSIS — M868X6 Other osteomyelitis, lower leg: Secondary | ICD-10-CM | POA: Diagnosis present

## 2014-11-04 DIAGNOSIS — M7989 Other specified soft tissue disorders: Secondary | ICD-10-CM | POA: Diagnosis not present

## 2014-11-04 DIAGNOSIS — Z87891 Personal history of nicotine dependence: Secondary | ICD-10-CM | POA: Diagnosis not present

## 2014-11-04 DIAGNOSIS — R32 Unspecified urinary incontinence: Secondary | ICD-10-CM | POA: Diagnosis present

## 2014-11-04 DIAGNOSIS — I10 Essential (primary) hypertension: Secondary | ICD-10-CM | POA: Diagnosis present

## 2014-11-04 DIAGNOSIS — Z452 Encounter for adjustment and management of vascular access device: Secondary | ICD-10-CM | POA: Diagnosis not present

## 2014-11-04 DIAGNOSIS — Z96651 Presence of right artificial knee joint: Secondary | ICD-10-CM | POA: Diagnosis not present

## 2014-11-04 DIAGNOSIS — T8453XA Infection and inflammatory reaction due to internal right knee prosthesis, initial encounter: Secondary | ICD-10-CM | POA: Diagnosis not present

## 2014-11-04 DIAGNOSIS — E46 Unspecified protein-calorie malnutrition: Secondary | ICD-10-CM | POA: Diagnosis not present

## 2014-11-04 DIAGNOSIS — G8918 Other acute postprocedural pain: Secondary | ICD-10-CM | POA: Diagnosis not present

## 2014-11-04 DIAGNOSIS — T8450XS Infection and inflammatory reaction due to unspecified internal joint prosthesis, sequela: Secondary | ICD-10-CM | POA: Diagnosis not present

## 2014-11-04 LAB — CBC WITH DIFFERENTIAL/PLATELET
BASOS ABS: 0.1 10*3/uL (ref 0.0–0.1)
Basophils Relative: 1 %
Eosinophils Absolute: 0.1 10*3/uL (ref 0.0–0.7)
Eosinophils Relative: 1 %
HEMATOCRIT: 36 % (ref 36.0–46.0)
HEMOGLOBIN: 11 g/dL — AB (ref 12.0–15.0)
LYMPHS PCT: 34 %
Lymphs Abs: 2.6 10*3/uL (ref 0.7–4.0)
MCH: 20.9 pg — ABNORMAL LOW (ref 26.0–34.0)
MCHC: 30.6 g/dL (ref 30.0–36.0)
MCV: 68.3 fL — ABNORMAL LOW (ref 78.0–100.0)
MONOS PCT: 7 %
Monocytes Absolute: 0.5 10*3/uL (ref 0.1–1.0)
NEUTROS ABS: 4.4 10*3/uL (ref 1.7–7.7)
Neutrophils Relative %: 57 %
Platelets: 407 10*3/uL — ABNORMAL HIGH (ref 150–400)
RBC: 5.27 MIL/uL — AB (ref 3.87–5.11)
RDW: 18.3 % — ABNORMAL HIGH (ref 11.5–15.5)
WBC: 7.7 10*3/uL (ref 4.0–10.5)

## 2014-11-04 LAB — HEPATIC FUNCTION PANEL
ALT: 7 U/L — AB (ref 14–54)
AST: 16 U/L (ref 15–41)
Albumin: 3.6 g/dL (ref 3.5–5.0)
Alkaline Phosphatase: 101 U/L (ref 38–126)
BILIRUBIN DIRECT: 0.2 mg/dL (ref 0.1–0.5)
BILIRUBIN INDIRECT: 0.4 mg/dL (ref 0.3–0.9)
BILIRUBIN TOTAL: 0.6 mg/dL (ref 0.3–1.2)
Total Protein: 8.7 g/dL — ABNORMAL HIGH (ref 6.5–8.1)

## 2014-11-04 LAB — BASIC METABOLIC PANEL
ANION GAP: 10 (ref 5–15)
BUN: 7 mg/dL (ref 6–20)
CHLORIDE: 107 mmol/L (ref 101–111)
CO2: 22 mmol/L (ref 22–32)
Calcium: 9.7 mg/dL (ref 8.9–10.3)
Creatinine, Ser: 0.69 mg/dL (ref 0.44–1.00)
GFR calc non Af Amer: >60 mL/min (ref >60)
Glucose, Bld: 99 mg/dL (ref 65–99)
POTASSIUM: 4.6 mmol/L (ref 3.5–5.1)
Sodium: 139 mmol/L (ref 135–145)

## 2014-11-04 LAB — C-REACTIVE PROTEIN: CRP: 0.9 mg/dL (ref <1.0)

## 2014-11-04 LAB — SEDIMENTATION RATE: SED RATE: 45 mm/h — AB (ref 0–22)

## 2014-11-04 MED ORDER — HYDROMORPHONE HCL 1 MG/ML IJ SOLN
0.5000 mg | INTRAMUSCULAR | Status: DC | PRN
  Administered 2014-11-06 – 2014-11-09 (×5): 0.5 mg via INTRAVENOUS
  Filled 2014-11-04 (×5): qty 1

## 2014-11-04 MED ORDER — CEFAZOLIN SODIUM-DEXTROSE 2-3 GM-% IV SOLR
2.0000 g | Freq: Once | INTRAVENOUS | Status: AC
  Administered 2014-11-04: 2 g via INTRAVENOUS
  Filled 2014-11-04: qty 50

## 2014-11-04 MED ORDER — ALUM & MAG HYDROXIDE-SIMETH 200-200-20 MG/5ML PO SUSP
15.0000 mL | Freq: Four times a day (QID) | ORAL | Status: DC | PRN
  Administered 2014-11-04: 15 mL via ORAL
  Filled 2014-11-04: qty 30

## 2014-11-04 MED ORDER — IRBESARTAN 150 MG PO TABS
150.0000 mg | ORAL_TABLET | Freq: Every day | ORAL | Status: DC
  Administered 2014-11-05: 150 mg via ORAL
  Filled 2014-11-04 (×2): qty 1

## 2014-11-04 MED ORDER — DOCUSATE SODIUM 100 MG PO CAPS
100.0000 mg | ORAL_CAPSULE | Freq: Two times a day (BID) | ORAL | Status: DC
  Administered 2014-11-04 – 2014-11-09 (×9): 100 mg via ORAL
  Filled 2014-11-04 (×12): qty 1

## 2014-11-04 MED ORDER — VANCOMYCIN HCL 10 G IV SOLR
2000.0000 mg | Freq: Once | INTRAVENOUS | Status: AC
  Administered 2014-11-04: 2000 mg via INTRAVENOUS
  Filled 2014-11-04: qty 2000

## 2014-11-04 MED ORDER — VANCOMYCIN HCL 10 G IV SOLR
1250.0000 mg | Freq: Two times a day (BID) | INTRAVENOUS | Status: AC
  Administered 2014-11-04 – 2014-11-06 (×3): 1250 mg via INTRAVENOUS
  Filled 2014-11-04 (×5): qty 1250

## 2014-11-04 MED ORDER — ACETAMINOPHEN 325 MG PO TABS
650.0000 mg | ORAL_TABLET | Freq: Four times a day (QID) | ORAL | Status: DC | PRN
  Administered 2014-11-07 – 2014-11-08 (×2): 650 mg via ORAL
  Filled 2014-11-04 (×2): qty 2

## 2014-11-04 MED ORDER — LEVALBUTEROL HCL 0.63 MG/3ML IN NEBU
0.6300 mg | INHALATION_SOLUTION | Freq: Four times a day (QID) | RESPIRATORY_TRACT | Status: DC
  Administered 2014-11-04: 0.63 mg via RESPIRATORY_TRACT
  Filled 2014-11-04: qty 3

## 2014-11-04 MED ORDER — SODIUM CHLORIDE 0.9 % IJ SOLN
3.0000 mL | Freq: Two times a day (BID) | INTRAMUSCULAR | Status: DC
  Administered 2014-11-04 – 2014-11-08 (×5): 3 mL via INTRAVENOUS

## 2014-11-04 MED ORDER — HYDRALAZINE HCL 20 MG/ML IJ SOLN
10.0000 mg | Freq: Four times a day (QID) | INTRAMUSCULAR | Status: DC | PRN
  Administered 2014-11-04 – 2014-11-06 (×2): 10 mg via INTRAVENOUS
  Filled 2014-11-04: qty 1

## 2014-11-04 MED ORDER — ENOXAPARIN SODIUM 60 MG/0.6ML ~~LOC~~ SOLN
50.0000 mg | SUBCUTANEOUS | Status: DC
  Administered 2014-11-04 – 2014-11-09 (×4): 50 mg via SUBCUTANEOUS
  Filled 2014-11-04 (×6): qty 0.6

## 2014-11-04 MED ORDER — ACETAMINOPHEN 650 MG RE SUPP: 650.0000 mg | Freq: Four times a day (QID) | RECTAL | Status: DC | PRN

## 2014-11-04 MED ORDER — OXYCODONE-ACETAMINOPHEN 5-325 MG PO TABS
2.0000 | ORAL_TABLET | Freq: Four times a day (QID) | ORAL | Status: DC | PRN
  Administered 2014-11-04 – 2014-11-07 (×10): 2 via ORAL
  Filled 2014-11-04 (×10): qty 2

## 2014-11-04 MED ORDER — ATORVASTATIN CALCIUM 40 MG PO TABS
40.0000 mg | ORAL_TABLET | Freq: Every morning | ORAL | Status: DC
  Administered 2014-11-05 – 2014-11-09 (×5): 40 mg via ORAL
  Filled 2014-11-04 (×6): qty 1

## 2014-11-04 MED ORDER — SODIUM CHLORIDE 0.9 % IV SOLN
INTRAVENOUS | Status: DC
Start: 2014-11-04 — End: 2014-11-07
  Administered 2014-11-04: 09:00:00 via INTRAVENOUS
  Administered 2014-11-07: 75 mL/h via INTRAVENOUS

## 2014-11-04 MED ORDER — NEBIVOLOL HCL 5 MG PO TABS
10.0000 mg | ORAL_TABLET | Freq: Every morning | ORAL | Status: DC
  Administered 2014-11-04 – 2014-11-09 (×6): 10 mg via ORAL
  Filled 2014-11-04: qty 2
  Filled 2014-11-04: qty 1
  Filled 2014-11-04 (×2): qty 2
  Filled 2014-11-04: qty 1
  Filled 2014-11-04: qty 2

## 2014-11-04 NOTE — H&P (Signed)
[facility] History and Physical  Dione Petron NWG:[id number] DOB: 07/20/1950 DOA: 11/04/2014  Referring physician:  PCP: Lupita Raider, MD   Chief Complaint: *   HPI:  [name] [name] is a 64 y.o. female with history of bilateral knee prosthetic joint replacements, last done on 07/2010 who presented to ED with 4 day history of right knee pain, swelling and increasing warmth to touch and drainage from   right patellar region. It became increasingly painful unable to weight bear during ambulation. She denies recent trauma to leg, no fevers, but does endorse chills. Patient has had irrigation and debridement of the right knee on 12/09/12 for right knee infection. Patient also had MSSA bacteremia along with septic arthritis of the right knee, previously treated with 6 weeks of cefazolin and 6 months of oral Keflex and managed by infectious disease Dr. Molly Maduro comer  as an outpatient.  In the ER the patient is afebrile, hypertensive, otherwise hemodynamically stable, white count is 7.7. X-ray of the right knee show soft tissue swelling and right total knee arthroplasty. Orthopedics has been consulted   Review of Systems: negative for the following  Constitutional: Denies fever, chills, diaphoresis, appetite change and fatigue.  HEENT: Denies photophobia, eye pain, redness, hearing loss, ear pain, congestion, sore throat, rhinorrhea, sneezing, mouth sores, trouble swallowing, neck pain, neck stiffness and tinnitus.  Respiratory: Denies SOB, DOE, cough, chest tightness, and wheezing.  Cardiovascular: Denies chest pain, palpitations and leg swelling.  Gastrointestinal: Denies nausea, vomiting, abdominal pain, diarrhea, constipation, blood in stool and abdominal distention.  Genitourinary: Denies dysuria, urgency, frequency, hematuria, flank pain and difficulty urinating.  Musculoskeletal: Right knee, joint swelling, arthralgias and gait problem.  Skin: Denies pallor, rash and wound.   Neurological: Denies dizziness, seizures, syncope, weakness, light-headedness, numbness and headaches.  Hematological: Denies adenopathy. Easy bruising, personal or family bleeding history  Psychiatric/Behavioral: Denies suicidal ideation, mood changes, confusion, nervousness, sleep disturbance and agitation       Past Medical History  Diagnosis Date  . Hyperlipidemia   . Hypertension   . S/P right knee arthroscopy     with septic joint growing out MSSA  . Septic arthritis of knee, right   . Overactive bladder      Past Surgical History  Procedure Laterality Date  . Joint replacement    . Knee surgery Bilateral   . Knee arthroscopy Right 12/09/2012    Procedure: ARTHROSCOPIC IRRIGATION AND DEBRIDEMENT RIGHT KNEE;  Surgeon: Budd Palmer, MD;  Location: [facility] OR;  Service: Orthopedics;  Laterality: Right;      Social History:  reports that she quit smoking about 22 years ago. She has never used smokeless tobacco. She reports that she does not drink alcohol or use illicit drugs.    No Known Allergies      FAMILY HISTORY  When questioned  Directly-patient reports  No family history of HTN, CVA ,DIABETES, TB, Cancer CAD, Bleeding Disorders, Sickle Cell, diabetes, anemia, asthma,   Prior to Admission medications   Medication Sig Start Date End Date Taking? Authorizing Provider  Aspirin-Salicylamide-Caffeine (BC HEADACHE POWDER PO) Take 1 Package by mouth daily as needed (for pain).   Yes Historical Provider, MD  atorvastatin (LIPITOR) 40 MG tablet Take 40 mg by mouth every morning.    Yes Historical Provider, MD  nebivolol (BYSTOLIC) 10 MG tablet Take 10 mg by mouth every morning.    Yes Historical Provider, MD  valsartan (DIOVAN) 160 MG tablet Take 160 mg by mouth daily.   Yes Historical Provider,  MD  aspirin 81 MG tablet Take 81 mg by mouth every morning.     Historical Provider, MD  HYDROcodone-acetaminophen (NORCO/VICODIN) 5-325 MG per tablet Take one tablet by mouth  every four hours as needed for moderate pain; Take two tablets by mouth every four hours as needed for severe pain Patient not taking: Reported on 11/04/2014 12/25/12   Tiffany L Reed, DO  oxyCODONE-acetaminophen (PERCOCET/ROXICET) 5-325 MG per tablet Take 2 tablets by mouth every 6 (six) hours as needed for severe pain. Patient not taking: Reported on 11/04/2014 09/09/13   Roxy Horseman, PA-C  traMADol (ULTRAM) 50 MG tablet Take 1 tablet (50 mg total) by mouth every 6 (six) hours as needed. Patient not taking: Reported on 11/04/2014 12/26/12   Kimber Relic, MD     Physical Exam: Filed Vitals:   11/04/14 0331 11/04/14 0400 11/04/14 0603 11/04/14 0630  BP: 183/80 183/80 172/72 179/72  Pulse: 85 76 77 81  Temp:      TempSrc:      Resp: SpO2: 96% 98% 98% 100%     Constitutional: Vital signs reviewed. Patient is a well-developed and well-nourished in no acute distress and cooperative with exam. Alert and oriented x3.  Head: Normocephalic and atraumatic  Ear: TM normal bilaterally  Mouth: no erythema or exudates, MMM  Eyes: PERRL, EOMI, conjunctivae normal, No scleral icterus.  Neck: Supple, Trachea midline normal ROM, No JVD, mass, thyromegaly, or carotid bruit present.  Cardiovascular: RRR, S1 normal, S2 normal, no MRG, pulses symmetric and intact bilaterally  Pulmonary/Chest: CTAB, no wheezes, rales, or rhonchi  Abdominal: Soft. Non-tender, non-distended, bowel sounds are normal, no masses, organomegaly, or guarding present.  GU: no CVA tenderness Musculoskeletal: No joint deformities, erythema, or stiffness, ROM full and no nontender Extremities: No deformity; decreased range of motion of the knees, right greater than left; pulses normal; swelling, tenderness and pain on attempted range of motion of right knee. Drainage from the right knee joint Hematology: no cervical, inginal, or axillary adenopathy.  Neurological: A&O x3, Strenght is normal and symmetric bilaterally,  cranial nerve II-XII are grossly intact, no focal motor deficit, sensory intact to light touch bilaterally.  Skin: Warm, dry and intact. No rash, cyanosis, or clubbing.  Psychiatric: Normal mood and affect. speech and behavior is normal. Judgment and thought content normal. Cognition and memory are normal.      Data Review   Micro Results No results found for this or any previous visit (from the past 240 hour(s)).  Radiology Reports Dg Knee 1-2 Views Right  11/04/2014   CLINICAL DATA:  Right knee drainage for 4 days. Previous right knee arthroplasty. No injury.  EXAM: RIGHT KNEE - 1-2 VIEW  COMPARISON:  09/09/2013  FINDINGS: Right total knee arthroplasty. Components appear grossly well-seated although oblique positioning of the lateral view limits evaluation of the femoral component. No evidence of acute fracture or dislocation. No significant effusion. Soft tissue swelling in the subcutaneous fat superficial to the patella may represent edema or cellulitis.  IMPRESSION: Subcutaneous soft tissue swelling. Right total knee arthroplasty appears grossly well seated. No acute fractures.   Electronically Signed   By: Burman Nieves M.D.   On: 11/04/2014 06:03     CBC  Recent Labs Lab 11/04/14 0507  WBC 7.7  HGB 11.0*  HCT 36.0  PLT 407*  MCV 68.3*  MCH 20.9*  MCHC 30.6  RDW 18.3*  LYMPHSABS 2.6  MONOABS 0.5  EOSABS 0.1  BASOSABS 0.1  Chemistries   Recent Labs Lab 11/04/14 0507  NA 139  K 4.6  CL 107  CO2 22  GLUCOSE 99  BUN 7  CREATININE 0.69  CALCIUM 9.7   ------------------------------------------------------------------------------------------------------------------ CrCl cannot be calculated (Unknown ideal weight.). ------------------------------------------------------------------------------------------------------------------ No results for input(s): HGBA1C in the last 72  hours. ------------------------------------------------------------------------------------------------------------------ No results for input(s): CHOL, HDL, LDLCALC, TRIG, CHOLHDL, LDLDIRECT in the last 72 hours. ------------------------------------------------------------------------------------------------------------------ No results for input(s): TSH, T4TOTAL, T3FREE, THYROIDAB in the last 72 hours.  Invalid input(s): FREET3 ------------------------------------------------------------------------------------------------------------------ No results for input(s): VITAMINB12, FOLATE, FERRITIN, TIBC, IRON, RETICCTPCT in the last 72 hours.  Coagulation profile No results for input(s): INR, PROTIME in the last 168 hours.  No results for input(s): DDIMER in the last 72 hours.  Cardiac Enzymes No results for input(s): CKMB, TROPONINI, MYOGLOBIN in the last 168 hours.  Invalid input(s): CK ------------------------------------------------------------------------------------------------------------------ Invalid input(s): POCBNP   CBG: No results for input(s): GLUCAP in the last 168 hours.     EKG: Independently reviewed.   Assessment/Plan Active Problems:    Septic arthritis of the right knee Orthopedics consulted, IR guided aspiration for cell count and culture has been ordered Patient empirically started on vancomycin Blood culture 2   Hypertension-blood pressure uncontrolled, patient takes Diovan and diastolic at home which has been restarted. Start prn hydralazine   Dyslipidemia continue Lipitor    Code Status:   full Family Communication: bedside Disposition Plan: admit   Total time spent 55 minutes.Greater than 50% of this time was spent in counseling, explanation of diagnosis, planning of further management, and coordination of care  [facility] Pager [phone]  If 7PM-7AM, please contact night-coverage [email or web address] Password  [facility] 11/04/2014, 7:32 AM

## 2014-11-04 NOTE — Progress Notes (Signed)
Patient ID: [name] [name], female   DOB: [birth year], 64 y.o.   MRN: [id number]         [facility]    Date of Admission:  11/04/2014           Day 1 vancomycin       Reason for Consult: Chronic right prosthetic knee infection    Referring Physician: Dr. Richardson Landry  Active Problems:   Infection of prosthetic knee joint   . atorvastatin  40 mg Oral q morning - 10a  . docusate sodium  100 mg Oral BID  . enoxaparin (LOVENOX) injection  50 mg Subcutaneous Q24H  . irbesartan  150 mg Oral Daily  . nebivolol  10 mg Oral q morning - 10a  . sodium chloride  3 mL Intravenous Q12H  . vancomycin  1,250 mg Intravenous Q12H    Recommendations: 1. 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 I will continue vancomycin pending final cultures. She will need another PICC. She probably needs removal of all hardware with spacer placement followed by at least 6 weeks of IV antibiotic therapy.    HPI: [name] [name] is a 64 y.o. female who underwent right total knee arthroplasty in 2012. 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 She underwent incision and drainage and polyps change followed by 6 weeks of IV cefazolin and oral rifampin. She was then converted over to oral cephalexin along with rifampin and continued antibiotic therapy until June 2015. She was followed by my partner, Dr. Merceda Elks. She was feeling better and her sedimentation rate and C-reactive protein had normalized so antibiotics were stopped at that time. She fell on her right knee in July 2015 and suffered a traumatic hemarthrosis. She has had worsening pain since that time. She underwent a bone scan in March of this year which showed probable loosening of all components in the knee. She was scheduled for surgery  in April but it was felt that her blood pressure needed to come under better control. She was working on that when she started having increasing pain, swelling, redness and drainage from her knee recently. She came to the emergency department today where 10 mL of serosanguineous fluid was drained from the knee. Gram stain shows gram-positive cocci in pairs.   Review of Systems: Constitutional: positive for anorexia and weight loss, negative for chills, fevers and sweats Eyes: negative Ears, nose, mouth, throat, and face: negative Respiratory: negative Cardiovascular: negative Gastrointestinal: negative Genitourinary:negative Integument/breast: positive for recent dryness, redness and cracking of the skin over her right knee Musculoskeletal:positive for arthralgias Behavioral/Psych: negative  Past Medical History  Diagnosis Date  . Hyperlipidemia   . Hypertension   . S/P right knee arthroscopy     with septic joint growing out MSSA  . Septic arthritis of knee, right   . Overactive bladder     Social History  Substance Use Topics  . Smoking status: Former Smoker    Quit date: 08/01/1992  . Smokeless tobacco: Never Used     Comment: pt. does not smoke  . Alcohol Use: No    No family history on file. No Known Allergies  OBJECTIVE: Blood pressure 145/63, pulse 78, temperature 97.9 F (36.6 C), temperature source Oral, resp. rate 18, height  (1.626 m), weight 251 lb 8 oz (114.08  kg), SpO2 99 %. General: She is alert and in good spirits Skin: Dry peeling skin over her anterior right knee Oral: No oropharyngeal lesions Lymph nodes: No palpable adenopathy  Lung: clear Cor: Regular S1 and S2 with no murmur AbdomObese, soft and nontender  Extremities: Right knee is slightly warm and swollen. There is dark brown drainage from a pinpoint hole inferiorly  Lab Results Lab Results  Component Value Date   WBC 7.7 11/04/2014   HGB 11.0* 11/04/2014   HCT 36.0 11/04/2014   MCV  68.3* 11/04/2014   PLT 407* 11/04/2014    Lab Results  Component Value Date   CREATININE 0.69 11/04/2014   BUN 7 11/04/2014   NA 139 11/04/2014   K 4.6 11/04/2014   CL 107 11/04/2014   CO2 22 11/04/2014    Lab Results  Component Value Date   ALT 7* 11/04/2014   AST 16 11/04/2014   ALKPHOS 101 11/04/2014   BILITOT 0.6 11/04/2014     Microbiology: Recent Results (from the past 240 hour(s))  Wound culture     Status: None (Preliminary result)   Collection Time: 11/04/14  5:12 AM  Result Value Ref Range Status   Specimen Description WOUND KNEE  Final   Special Requests NONE  Final   Gram Stain   Final    NO WBC SEEN FEW SQUAMOUS EPITHELIAL CELLS PRESENT NO ORGANISMS SEEN Performed at [facility]    Culture PENDING  Incomplete   Report Status PENDING  Incomplete  Anaerobic culture     Status: None (Preliminary result)   Collection Time: 11/04/14 12:50 PM  Result Value Ref Range Status   Specimen Description   Final    FLUID RIGHT KNEE Performed at [facility]    Special Requests Normal  Final   Gram Stain PENDING  Incomplete   Culture PENDING  Incomplete   Report Status PENDING  Incomplete  Body fluid culture     Status: None (Preliminary result)   Collection Time: 11/04/14 12:50 PM  Result Value Ref Range Status   Specimen Description FLUID RIGHT KNEE  Final   Special Requests NONE  Final   Gram Stain   Final    ABUNDANT WBC PRESENT,BOTH PMN AND MONONUCLEAR FEW GRAM POSITIVE COCCI IN PAIRS Performed at [facility]    Culture PENDING  Incomplete   Report Status PENDING  Incomplete    Cliffton Asters, MD [facility] [phone] pager   [phone] cell 11/04/2014, 5:04 PM

## 2014-11-04 NOTE — Procedures (Signed)
Successful US guided rt knee jt aspiration No comp Stable 10cc blood tinged exudative jt fld aspirated Labs sent Full report in pacs

## 2014-11-04 NOTE — ED Notes (Signed)
Bed: ZO10 Expected date:  Expected time:  Means of arrival:  Comments: EMS 64 yo female-right knee pain

## 2014-11-04 NOTE — ED Provider Notes (Signed)
CSN: [id number]     Arrival date & time 11/04/14  0300 History   First MD Initiated Contact with Patient 11/04/14 [phone]     Chief Complaint  Patient presents with  . Knee Pain     (Consider location/radiation/quality/duration/timing/severity/associated sxs/prior Treatment) HPI  This is a 64 year old female with a history of bilateral total knee replacements as well as subsequent history of right knee infection (MSSA, no anaerobes) 2 years ago that required long-term antibiotics via PICC line. She has had a return of pain in the right knee since July of this year. She has been nonambulatory since that time, getting around the house by scooting around in a desk chair. Over the past few days she has had an increase in pain in the right knee as well as increased erythema over the right patella. Yesterday she noticed that she had clear drainage from the skin overlying the patella. She has not had a fever. She has not noticed purulent drainage. The pain in her knee is mild to moderate at rest but severe if she tries to move it. Range of motion is very limited due to pain and swelling.    Past Medical History  Diagnosis Date  . Hyperlipidemia   . Hypertension   . S/P right knee arthroscopy     with septic joint growing out MSSA  . Septic arthritis of knee, right   . Overactive bladder    Past Surgical History  Procedure Laterality Date  . Joint replacement    . Knee surgery Bilateral   . Knee arthroscopy Right 12/09/2012    Procedure: ARTHROSCOPIC IRRIGATION AND DEBRIDEMENT RIGHT KNEE;  Surgeon: Budd Palmer, MD;  Location: [facility] OR;  Service: Orthopedics;  Laterality: Right;   No family history on file. Social History  Substance Use Topics  . Smoking status: Former Smoker    Quit date: 08/01/1992  . Smokeless tobacco: Never Used     Comment: pt. does not smoke  . Alcohol Use: No   OB History    No data available     Review of Systems  All other systems reviewed and are  negative.   Allergies  Review of patient's allergies indicates no known allergies.  Home Medications   Prior to Admission medications   Medication Sig Start Date End Date Taking? Authorizing Provider  Aspirin-Salicylamide-Caffeine (BC HEADACHE POWDER PO) Take 1 Package by mouth daily as needed (for pain).   Yes Historical Provider, MD  atorvastatin (LIPITOR) 40 MG tablet Take 40 mg by mouth every morning.    Yes Historical Provider, MD  nebivolol (BYSTOLIC) 10 MG tablet Take 10 mg by mouth every morning.    Yes Historical Provider, MD  valsartan (DIOVAN) 160 MG tablet Take 160 mg by mouth daily.   Yes Historical Provider, MD  aspirin 81 MG tablet Take 81 mg by mouth every morning.     Historical Provider, MD  HYDROcodone-acetaminophen (NORCO/VICODIN) 5-325 MG per tablet Take one tablet by mouth every four hours as needed for moderate pain; Take two tablets by mouth every four hours as needed for severe pain Patient not taking: Reported on 11/04/2014 12/25/12   Tiffany L Reed, DO  oxyCODONE-acetaminophen (PERCOCET/ROXICET) 5-325 MG per tablet Take 2 tablets by mouth every 6 (six) hours as needed for severe pain. Patient not taking: Reported on 11/04/2014 09/09/13   Roxy Horseman, PA-C  traMADol (ULTRAM) 50 MG tablet Take 1 tablet (50 mg total) by mouth every 6 (six) hours as needed. Patient not  taking: Reported on 11/04/2014 12/26/12   Kimber Relic, MD   BP 179/72 mmHg  Pulse 81  Temp(Src) 97.7 F (36.5 C) (Oral)  Resp 14  SpO2 100%   Physical Exam  General: Well-developed, obese female in no acute distress; appearance consistent with age of record HENT: normocephalic; atraumatic Eyes: pupils equal, round and reactive to light; extraocular muscles intact Neck: supple Heart: regular rate and rhythm Lungs: clear to auscultation bilaterally Abdomen: soft; obese; nontender; bowel sounds present Extremities: No deformity; decreased range of motion of the knees, right greater than left;  pulses normal; swelling, tenderness and pain on attempted range of motion of right knee Neurologic: Awake, alert and oriented; motor function intact in all extremities and symmetric; no facial droop Skin: Warm and dry; erythema and serous drainage of the skin anterior to the right patella:    Psychiatric: Normal mood and affect    ED Course  Procedures (including critical care time)   MDM  Nursing notes and vitals signs, including pulse oximetry, reviewed.  Summary of this visit's results, reviewed by myself:  Labs:  Results for orders placed or performed during the hospital encounter of 11/04/14 (from the past 24 hour(s))  CBC with Differential/Platelet     Status: Abnormal   Collection Time: 11/04/14  5:07 AM  Result Value Ref Range   WBC 7.7 4.0 - 10.5 K/uL   RBC 5.27 (H) 3.87 - 5.11 MIL/uL   Hemoglobin 11.0 (L) 12.0 - 15.0 g/dL   HCT 16.1 09.6 - 04.5 %   MCV 68.3 (L) 78.0 - 100.0 fL   MCH 20.9 (L) 26.0 - 34.0 pg   MCHC 30.6 30.0 - 36.0 g/dL   RDW 40.9 (H) 81.1 - 91.4 %   Platelets 407 (H) 150 - 400 K/uL   Neutrophils Relative % 57 %   Lymphocytes Relative 34 %   Monocytes Relative 7 %   Eosinophils Relative 1 %   Basophils Relative 1 %   Neutro Abs 4.4 1.7 - 7.7 K/uL   Lymphs Abs 2.6 0.7 - 4.0 K/uL   Monocytes Absolute 0.5 0.1 - 1.0 K/uL   Eosinophils Absolute 0.1 0.0 - 0.7 K/uL   Basophils Absolute 0.1 0.0 - 0.1 K/uL   Smear Review MORPHOLOGY UNREMARKABLE   Basic metabolic panel     Status: None   Collection Time: 11/04/14  5:07 AM  Result Value Ref Range   Sodium 139 135 - 145 mmol/L   Potassium 4.6 3.5 - 5.1 mmol/L   Chloride 107 101 - 111 mmol/L   CO2 22 22 - 32 mmol/L   Glucose, Bld 99 65 - 99 mg/dL   BUN 7 6 - 20 mg/dL   Creatinine, Ser 7.82 0.44 - 1.00 mg/dL   Calcium 9.7 8.9 - 95.6 mg/dL   GFR calc non Af Amer >60 >60 mL/min   GFR calc Af Amer >60 >60 mL/min   Anion gap 10 5 - 15    Imaging Studies: Dg Knee 1-2 Views Right  11/04/2014    CLINICAL DATA:  Right knee drainage for 4 days. Previous right knee arthroplasty. No injury.  EXAM: RIGHT KNEE - 1-2 VIEW  COMPARISON:  09/09/2013  FINDINGS: Right total knee arthroplasty. Components appear grossly well-seated although oblique positioning of the lateral view limits evaluation of the femoral component. No evidence of acute fracture or dislocation. No significant effusion. Soft tissue swelling in the subcutaneous fat superficial to the patella may represent edema or cellulitis.  IMPRESSION: Subcutaneous soft  tissue swelling. Right total knee arthroplasty appears grossly well seated. No acute fractures.   Electronically Signed   By: Burman Nieves M.D.   On: 11/04/2014 06:03   6:34 AM Discussed with Dr. Madelon Lips who is covering for Dr. Eulah Pont. They will see the patient in consult. He agrees with proceeding with antibiotics as wound has been cultured. Will have hospitalist admit.   Paula Libra, MD 11/04/14 [id number]

## 2014-11-04 NOTE — Consult Note (Signed)
Patient well known to me. Seen and examined today. Not acutely ill or septic. 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 Initial surgery 2011. Hematogenous infection 2014. I&D poly exchange then. Long term IV then oral antibiotics. Earlier this year evidence of ongoing subacute infection and probable loosening of components. Now with scant clear drainage at distal wound. Discussed at length with her. Plan transfer to [facility] for I&D, removal of previous total knee on Wednesday. Depending on findings either cement spacer with later revision OR single stage revision. There remains a great concern about ever curing this infection, given that she has not followed up with me since March and it has been ongoing since. Fortunately her xrays show very little bone loss or involvement. Still awaiting results of ultrasound guided aspiration of knee. Also we need input from ID service. Dr Richardson Landry

## 2014-11-04 NOTE — ED Notes (Signed)
Bed: WA17 Expected date:  Expected time:  Means of arrival:  Comments: 

## 2014-11-04 NOTE — Progress Notes (Signed)
ANTIBIOTIC CONSULT NOTE  Pharmacy Consult for Vancomycin Indication: R/O septic knee  No Known Allergies  Patient Measurements: Height:  (162.6 cm) Weight: 251 lb 8 oz (114.08 kg) IBW/kg (Calculated) : 54.7  Vital Signs: Temp: 98.1 F (36.7 C) (09/19 0825) Temp Source: Oral (09/19 0825) BP: 182/63 mmHg (09/19 0825) Pulse Rate: 81 (09/19 0825) Intake/Output from previous day:   Intake/Output from this shift:    Labs:  Recent Labs  11/04/14 0507  WBC 7.7  HGB 11.0*  PLT 407*  CREATININE 0.69   Estimated Creatinine Clearance: 89.2 mL/min (by C-G formula based on Cr of 0.69). No results for input(s): VANCOTROUGH, VANCOPEAK, VANCORANDOM, GENTTROUGH, GENTPEAK, GENTRANDOM, TOBRATROUGH, TOBRAPEAK, TOBRARND, AMIKACINPEAK, AMIKACINTROU, AMIKACIN in the last 72 hours.   Microbiology: No results found for this or any previous visit (from the past 720 hour(s)).  Anti-infectives    Start     Dose/Rate Route Frequency Ordered Stop   11/04/14 2200  vancomycin (VANCOCIN) 1,250 mg in sodium chloride 0.9 % 250 mL IVPB     1,250 mg 166.7 mL/hr over 90 Minutes Intravenous Every 12 hours 11/04/14 0846     11/04/14 0745  vancomycin (VANCOCIN) 2,000 mg in sodium chloride 0.9 % 500 mL IVPB     2,000 mg 250 mL/hr over 120 Minutes Intravenous  Once 11/04/14 0738     11/04/14 0645  ceFAZolin (ANCEF) IVPB 2 g/50 mL premix     2 g 100 mL/hr over 30 Minutes Intravenous  Once 11/04/14 1610        Assessment: 64 yo obese F who presents to ED with pain, swelling, redness of R knee that began draining several days ago.  She has hx B TKA and subsequent R knee MSSA infection in 2014 for which she completed 6 weeks of IV Ancef.   She is afebrile on admission and WBC wnl.  Cx data pending. Renal function is at patient's baseline.  NCrCl ~ 90-95 ml/min  9/19 Ancef x1 dose in ED 9/19 >> Vanc >>    9/19 blood x2: 9/19 Wound (knee):  Dose changes/levels:  Goal of Therapy:  Vancomycin  trough level 15-20 mcg/ml  Plan:  Vancomycin 2gm IV x1 now then  IV q12h Check Vancomycin trough at steady state Monitor renal function and cx data   Elson Clan 11/04/2014,8:46 AM

## 2014-11-04 NOTE — Care Management Note (Signed)
Case Management Note  Patient Details  Name: [name] [name] MRN: [id number] Date of Birth: 06-Feb-1951  Subjective/Objective:                Failed op treatment for on going knee infection over recent hardware site.    Action/Plan:Date:  Sept. 19, 2016 U.R. performed for needs and level of care. Will continue to follow for Case Management needs.  Marcelle Smiling, RN, BSN, [place]   [phone]   Expected Discharge Date:   (UNKNOWN)               Expected Discharge Plan:     In-House Referral:     Discharge planning Services     Post Acute Care Choice:    Choice offered to:     DME Arranged:    DME Agency:     HH Arranged:    HH Agency:     Status of Service:     Medicare Important Message Given:    Date Medicare IM Given:    Medicare IM give by:    Date Additional Medicare IM Given:    Additional Medicare Important Message give by:     If discussed at Long Length of Stay Meetings, dates discussed:    Additional Comments:  Golda Acre, RN 11/04/2014, 3:51 PM

## 2014-11-04 NOTE — ED Notes (Signed)
Per EMS pt right leg has been draining since Thursday. Has been taking tramadol and hydrocodone at home and has run out.

## 2014-11-04 NOTE — Consult Note (Signed)
Reason for Consult: infected right total knee Referring Physician: [facility]  [name] [name] is an 64 y.o. female.  HPI: Patient with over one year now of right knee pain progressively worsening.  Difficulty with weight bearing on the right leg, said she "pushes herself around the house in a wheeled desk chair", and transfers or very short distances with wheeled walker.  Acutely now with drainage from right knee for the last 4 days.  Previous knee replacements done by Dr. Amada Jupiter, one in 2011 on the right and then in 2012 on the left.  At the time she presented with her infection, 2014, her cultures grew out staph aureus, Methicillin sensitive.  She did well with initial debriding arthroscopy and then a course of IV antibiotics for six weeks and then p.o. antibiotics for almost six months.  Her acute situation definitely settled down, but the lingering pain has never resolved.  Because of the elevated SED rate, as well as C-reactive protein we pursued further workup with her bone scan.  At this point in time I think there is definitely evidence of either some loosening of her components from her infection or a smoldering non-acute infectious process.  Previous attempts to aspirate and culture her knee the end of last year, 2015, revealed a scant amount of bloody fluid, really no obvious purulence.  Intraarticular Cortisone injection helpful, but only for a short time.  The big issue here is lingering diffuse deep seated pain throughout her knee.  Not systemically ill.   It was decided in March of this year patient needed single stage revision vs two stage with cement spacer and the plan was to proceed at that time.  There were issues regarding clearance for surgery with uncontrolled diabetes and HTN and the surgery was cancelled at the time.  Also appears to have some noncompliance as far as follow up with ortho as well as PCP regarding the above issues.      Past Medical History  Diagnosis Date  .  Hyperlipidemia   . Hypertension   . S/P right knee arthroscopy     with septic joint growing out MSSA  . Septic arthritis of knee, right   . Overactive bladder     Past Surgical History  Procedure Laterality Date  . Joint replacement    . Knee surgery Bilateral   . Knee arthroscopy Right 12/09/2012    Procedure: ARTHROSCOPIC IRRIGATION AND DEBRIDEMENT RIGHT KNEE;  Surgeon: Rozanna Box, MD;  Location: [facility];  Service: Orthopedics;  Laterality: Right;    No family history on file.  Social History:  reports that she quit smoking about 22 years ago. She has never used smokeless tobacco. She reports that she does not drink alcohol or use illicit drugs.  Allergies: No Known Allergies  Medications: I have reviewed the patient's current medications.  Results for orders placed or performed during the hospital encounter of 11/04/14 (from the past 48 hour(s))  CBC with Differential/Platelet     Status: Abnormal   Collection Time: 11/04/14  5:07 AM  Result Value Ref Range   WBC 7.7 4.0 - 10.5 K/uL   RBC 5.27 (H) 3.87 - 5.11 MIL/uL   Hemoglobin 11.0 (L) 12.0 - 15.0 g/dL   HCT 36.0 36.0 - 46.0 %   MCV 68.3 (L) 78.0 - 100.0 fL   MCH 20.9 (L) 26.0 - 34.0 pg   MCHC 30.6 30.0 - 36.0 g/dL   RDW 18.3 (H) 11.5 - 15.5 %  Platelets 407 (H) 150 - 400 K/uL   Neutrophils Relative % 57 %   Lymphocytes Relative 34 %   Monocytes Relative 7 %   Eosinophils Relative 1 %   Basophils Relative 1 %   Neutro Abs 4.4 1.7 - 7.7 K/uL   Lymphs Abs 2.6 0.7 - 4.0 K/uL   Monocytes Absolute 0.5 0.1 - 1.0 K/uL   Eosinophils Absolute 0.1 0.0 - 0.7 K/uL   Basophils Absolute 0.1 0.0 - 0.1 K/uL   Smear Review MORPHOLOGY UNREMARKABLE   Basic metabolic panel     Status: None   Collection Time: 11/04/14  5:07 AM  Result Value Ref Range   Sodium 139 135 - 145 mmol/L   Potassium 4.6 3.5 - 5.1 mmol/L   Chloride 107 101 - 111 mmol/L   CO2 22 22 - 32 mmol/L   Glucose, Bld 99 65 - 99 mg/dL   BUN 7 6 - 20 mg/dL    Creatinine, Ser 0.69 0.44 - 1.00 mg/dL   Calcium 9.7 8.9 - 10.3 mg/dL   GFR calc non Af Amer >60 >60 mL/min   GFR calc Af Amer >60 >60 mL/min    Comment: (NOTE) The eGFR has been calculated using the CKD EPI equation. This calculation has not been validated in all clinical situations. eGFR's persistently <60 mL/min signify possible Chronic Kidney Disease.    Anion gap 10 5 - 15  C-reactive protein     Status: None   Collection Time: 11/04/14  5:07 AM  Result Value Ref Range   CRP 0.9 <1.0 mg/dL    Comment: Performed at [facility]  Sedimentation rate     Status: Abnormal   Collection Time: 11/04/14  5:07 AM  Result Value Ref Range   Sed Rate 45 (H) 0 - 22 mm/hr  Hepatic function panel     Status: Abnormal   Collection Time: 11/04/14  5:07 AM  Result Value Ref Range   Total Protein 8.7 (H) 6.5 - 8.1 g/dL   Albumin 3.6 3.5 - 5.0 g/dL   AST 16 15 - 41 U/L   ALT 7 (L) 14 - 54 U/L   Alkaline Phosphatase 101 38 - 126 U/L   Total Bilirubin 0.6 0.3 - 1.2 mg/dL   Bilirubin, Direct 0.2 0.1 - 0.5 mg/dL   Indirect Bilirubin 0.4 0.3 - 0.9 mg/dL    Dg Knee 1-2 Views Right  11/04/2014   CLINICAL DATA:  Right knee drainage for 4 days. Previous right knee arthroplasty. No injury.  EXAM: RIGHT KNEE - 1-2 VIEW  COMPARISON:  09/09/2013  FINDINGS: Right total knee arthroplasty. Components appear grossly well-seated although oblique positioning of the lateral view limits evaluation of the femoral component. No evidence of acute fracture or dislocation. No significant effusion. Soft tissue swelling in the subcutaneous fat superficial to the patella may represent edema or cellulitis.  IMPRESSION: Subcutaneous soft tissue swelling. Right total knee arthroplasty appears grossly well seated. No acute fractures.   Electronically Signed   By: Lucienne Capers M.D.   On: 11/04/2014 06:03    Review of Systems  Constitutional: Positive for chills. Negative for fever.  HENT: Negative.   Eyes:  Negative.   Respiratory: Negative.   Cardiovascular: Negative.   Gastrointestinal: Negative.   Genitourinary: Negative.   Musculoskeletal: Positive for joint pain. Negative for falls.  Skin:       Flaking/dry skin right knee now with "pus like" drainage  Neurological: Negative.   Endo/Heme/Allergies: Negative.   Psychiatric/Behavioral:  Negative.    Blood pressure 182/63, pulse 81, temperature 98.1 F (36.7 C), temperature source Oral, resp. rate 17, height '5\' 4"'  (1.626 m), weight 114.08 kg (251 lb 8 oz), SpO2 98 %. Physical Exam  Constitutional: She is oriented to person, place, and time. She appears well-developed and well-nourished. No distress.  Morbidly obese  HENT:  Head: Normocephalic and atraumatic.  Neck: Normal range of motion.  Cardiovascular: Normal rate and intact distal pulses.   Respiratory: Effort normal and breath sounds normal. No respiratory distress. She has no wheezes.  Musculoskeletal:       Right knee: She exhibits decreased range of motion, swelling, effusion, erythema and bony tenderness. She exhibits no ecchymosis, no LCL laxity and no MCL laxity. Tenderness found. Medial joint line and lateral joint line tenderness noted.  Significant pain with any attempt of passive ROM right knee  Lymphadenopathy:    She has no cervical adenopathy.  Neurological: She is alert and oriented to person, place, and time. No cranial nerve deficit.  Skin: Skin is warm and dry. There is erythema.     Psychiatric: She has a normal mood and affect. Her behavior is normal.    Assessment/Plan: Infected right total knee   Long standing infected right total knee.  This has been worked up and treatment discussed several months ago, documentation in our office from March recommend one stage vs two revision total knee for infection.  She had not been systemically ill, but now presenting with drainage.  There were some issues regarding surgical clearance since March due to uncontrolled  diabetes and HTN, I believe she even changed PCP.  Have discussed with Dr. Amada Jupiter and ortho team, do feel she needs 2 stage revision, first being I&D hardware removal and placement of antibiotic cement spacer.  Dr. Percell Miller will coordinate this.  Appreciate medicine team participation in her care.  [name] [name] 11/04/2014, 9:58 AM

## 2014-11-05 ENCOUNTER — Other Ambulatory Visit: Payer: Self-pay | Admitting: Physician Assistant

## 2014-11-05 DIAGNOSIS — G8929 Other chronic pain: Secondary | ICD-10-CM

## 2014-11-05 DIAGNOSIS — M25561 Pain in right knee: Secondary | ICD-10-CM

## 2014-11-05 LAB — CBC WITH DIFFERENTIAL/PLATELET
BASOS ABS: 0 10*3/uL (ref 0.0–0.1)
Basophils Relative: 0 %
EOS PCT: 1 %
Eosinophils Absolute: 0.1 10*3/uL (ref 0.0–0.7)
HCT: 37.3 % (ref 36.0–46.0)
HEMOGLOBIN: 11.2 g/dL — AB (ref 12.0–15.0)
LYMPHS ABS: 1.8 10*3/uL (ref 0.7–4.0)
LYMPHS PCT: 28 %
MCH: 21.1 pg — ABNORMAL LOW (ref 26.0–34.0)
MCHC: 30 g/dL (ref 30.0–36.0)
MCV: 70.2 fL — AB (ref 78.0–100.0)
Monocytes Absolute: 0.6 10*3/uL (ref 0.1–1.0)
Monocytes Relative: 8 %
NEUTROS ABS: 4.1 10*3/uL (ref 1.7–7.7)
NEUTROS PCT: 62 %
PLATELETS: 379 10*3/uL (ref 150–400)
RBC: 5.31 MIL/uL — AB (ref 3.87–5.11)
RDW: 19 % — ABNORMAL HIGH (ref 11.5–15.5)
WBC: 6.6 10*3/uL (ref 4.0–10.5)

## 2014-11-05 LAB — TYPE AND SCREEN
ABO/RH(D): A POS
ABO/RH(D): A POS
Antibody Screen: NEGATIVE
Antibody Screen: NEGATIVE

## 2014-11-05 LAB — COMPREHENSIVE METABOLIC PANEL
ALK PHOS: 88 U/L (ref 38–126)
ALK PHOS: 95 U/L (ref 38–126)
ALT: 8 U/L — AB (ref 14–54)
ALT: 8 U/L — AB (ref 14–54)
AST: 13 U/L — ABNORMAL LOW (ref 15–41)
AST: 17 U/L (ref 15–41)
Albumin: 3.2 g/dL — ABNORMAL LOW (ref 3.5–5.0)
Albumin: 3.2 g/dL — ABNORMAL LOW (ref 3.5–5.0)
Anion gap: 6 (ref 5–15)
Anion gap: 8 (ref 5–15)
BUN: 8 mg/dL (ref 6–20)
CALCIUM: 9.2 mg/dL (ref 8.9–10.3)
CALCIUM: 9.2 mg/dL (ref 8.9–10.3)
CHLORIDE: 111 mmol/L (ref 101–111)
CHLORIDE: 110 mmol/L (ref 101–111)
CO2: 23 mmol/L (ref 22–32)
CO2: 22 mmol/L (ref 22–32)
CREATININE: 0.49 mg/dL (ref 0.44–1.00)
CREATININE: 0.85 mg/dL (ref 0.44–1.00)
GFR calc Af Amer: >60 mL/min (ref >60)
Glucose, Bld: 103 mg/dL — ABNORMAL HIGH (ref 65–99)
Glucose, Bld: 107 mg/dL — ABNORMAL HIGH (ref 65–99)
Potassium: 3.8 mmol/L (ref 3.5–5.1)
Potassium: 4 mmol/L (ref 3.5–5.1)
Sodium: 140 mmol/L (ref 135–145)
Sodium: 140 mmol/L (ref 135–145)
Total Bilirubin: 0.7 mg/dL (ref 0.3–1.2)
Total Bilirubin: 0.5 mg/dL (ref 0.3–1.2)
Total Protein: 7.8 g/dL (ref 6.5–8.1)
Total Protein: 8.3 g/dL — ABNORMAL HIGH (ref 6.5–8.1)

## 2014-11-05 LAB — CBC
HCT: 35.8 % — ABNORMAL LOW (ref 36.0–46.0)
Hemoglobin: 10.7 g/dL — ABNORMAL LOW (ref 12.0–15.0)
MCH: 20.7 pg — AB (ref 26.0–34.0)
MCHC: 29.9 g/dL — ABNORMAL LOW (ref 30.0–36.0)
MCV: 69.4 fL — AB (ref 78.0–100.0)
PLATELETS: 360 10*3/uL (ref 150–400)
RBC: 5.16 MIL/uL — ABNORMAL HIGH (ref 3.87–5.11)
RDW: 18.7 % — AB (ref 11.5–15.5)
WBC: 5.5 10*3/uL (ref 4.0–10.5)

## 2014-11-05 LAB — PROTIME-INR
INR: 1.12 (ref 0.00–1.49)
PROTHROMBIN TIME: 14.6 s (ref 11.6–15.2)

## 2014-11-05 LAB — ABO/RH: ABO/RH(D): A POS

## 2014-11-05 LAB — HEMOGLOBIN A1C
Hgb A1c MFr Bld: 6.2 % — ABNORMAL HIGH (ref 4.8–5.6)
MEAN PLASMA GLUCOSE: 131 mg/dL

## 2014-11-05 LAB — APTT: APTT: 27 s (ref 24–37)

## 2014-11-05 MED ORDER — CHLORHEXIDINE GLUCONATE 4 % EX LIQD
60.0000 mL | Freq: Once | CUTANEOUS | Status: AC
  Administered 2014-11-05: 4 via TOPICAL
  Filled 2014-11-05: qty 60

## 2014-11-05 MED ORDER — SODIUM CHLORIDE 0.9 % IV SOLN
1500.0000 mg | INTRAVENOUS | Status: DC
  Filled 2014-11-05: qty 1500

## 2014-11-05 MED ORDER — CHLORHEXIDINE GLUCONATE 4 % EX LIQD
60.0000 mL | Freq: Once | CUTANEOUS | Status: AC
  Administered 2014-11-06: 4 via TOPICAL
  Filled 2014-11-05: qty 60

## 2014-11-05 MED ORDER — CHLORHEXIDINE GLUCONATE 4 % EX LIQD
60.0000 mL | Freq: Once | CUTANEOUS | Status: DC
  Filled 2014-11-05: qty 60

## 2014-11-05 MED ORDER — LACTATED RINGERS IV SOLN
INTRAVENOUS | Status: DC
  Administered 2014-11-05: 100 mL/h via INTRAVENOUS
  Administered 2014-11-06 (×3): via INTRAVENOUS

## 2014-11-05 MED ORDER — VANCOMYCIN HCL 10 G IV SOLR
1250.0000 mg | Freq: Two times a day (BID) | INTRAVENOUS | Status: DC
  Administered 2014-11-06 – 2014-11-07 (×2): 1250 mg via INTRAVENOUS
  Filled 2014-11-05 (×4): qty 1250

## 2014-11-05 MED ORDER — IRBESARTAN 150 MG PO TABS: 150.0000 mg | ORAL_TABLET | Freq: Every day | ORAL | Status: DC

## 2014-11-05 NOTE — Progress Notes (Signed)
[facility] PROGRESS NOTE  [name] [name] ONG:[id number] DOB: [birth year] DOA: 11/04/2014 PCP: Lupita Raider, MD  Assessment/Plan: Active Problems:   Infection of prosthetic knee joint    Chronic right prosthetic knee infection-most likely due to the same MSSA that was isolated in October 2014. Continue vancomycin pending final cultures, patient will need PICC line, she will need hardware removal with spacer placement followed by 6 weeks of IV antibiotics Dr. Eulah Pont and Dr. Orvan Falconer following Transfer to [facility] for surgery tomorrow  Hypertension-uncontrolled, patient takes Diovan and bystolic at home which has been restarted. Start prn hydralazine   Dyslipidemia continue Lipitor  Prediabetes-hemoglobin A1c 6.2, previously 6.6. Patient currently does not take any anti-diabetic medications at home  DVT prophylaxsis   Code Status:      Code Status Orders        Start     Ordered   11/04/14 0732  Full code   Continuous     11/04/14 0732     Family Communication: family updated about patient's clinical progress Disposition Plan:  Transfer to [facility] for surgery tomorrow   Brief narrative: [name] [name] is a 64 y.o. female with history of bilateral knee prosthetic joint replacements, last done on 07/2010 who presented to ED with 4 day history of right knee pain, swelling and increasing warmth to touch and drainage from right patellar region. It became increasingly painful unable to weight bear during ambulation. She denies recent trauma to leg, no fevers, but does endorse chills. Patient has had irrigation and debridement of the right knee on 12/09/12 for right knee infection. Patient also had MSSA bacteremia along with septic arthritis of the right knee, previously treated with 6 weeks of cefazolin and 6 months of oral Keflex and managed by infectious disease Dr. Molly Maduro comer as an outpatient.  In the ER the patient is afebrile, hypertensive, otherwise  hemodynamically stable, white count is 7.7. X-ray of the right knee show soft tissue swelling and right total knee arthroplasty. Orthopedics has been consulted  Consultants:  None  Procedures:  None  Antibiotics: Anti-infectives    Start     Dose/Rate Route Frequency Ordered Stop   11/04/14 2200  vancomycin (VANCOCIN) 1,250 mg in sodium chloride 0.9 % 250 mL IVPB     1,250 mg 166.7 mL/hr over 90 Minutes Intravenous Every 12 hours 11/04/14 0846     11/04/14 0745  vancomycin (VANCOCIN) 2,000 mg in sodium chloride 0.9 % 500 mL IVPB     2,000 mg 250 mL/hr over 120 Minutes Intravenous  Once 11/04/14 0738 11/04/14 1056   11/04/14 0645  ceFAZolin (ANCEF) IVPB 2 g/50 mL premix     2 g 100 mL/hr over 30 Minutes Intravenous  Once 11/04/14 0634 11/04/14 0745         HPI/Subjective: Patient states the pain in the knee as well controlled, we covered with a dressing, otherwise no complaints of chest pain shortness of breath nausea vomiting  Objective: Filed Vitals:   11/04/14 0908 11/04/14 1528 11/04/14 2241 11/05/14 0630  BP:  145/63 192/81 165/80  Pulse:  78 78 74  Temp:  97.9 F (36.6 C) 98 F (36.7 C) 98.7 F (37.1 C)  TempSrc:  Oral Oral Oral  Resp:  Height:      Weight:      SpO2: 98% 99% 100% 100%    Intake/Output Summary (Last 24 hours) at 11/05/14 1049 Last data filed at 11/05/14 0800  Gross per  24 hour  Intake    360 ml  Output      0 ml  Net    360 ml    Exam:  General: No acute respiratory distress Lungs: Clear to auscultation bilaterally without wheezes or crackles Cardiovascular: Regular rate and rhythm without murmur gallop or rub normal S1 and S2 Abdomen: Nontender, nondistended, soft, bowel sounds positive, no rebound, no ascites, no appreciable mass ExtremitiesSignificant pain with any attempt of passive ROM right knee     Data Review   Micro Results Recent Results (from the past 240 hour(s))  Wound culture     Status: None  (Preliminary result)   Collection Time: 11/04/14  5:12 AM  Result Value Ref Range Status   Specimen Description WOUND KNEE  Final   Special Requests NONE  Final   Gram Stain   Final    NO WBC SEEN FEW SQUAMOUS EPITHELIAL CELLS PRESENT NO ORGANISMS SEEN Performed at [facility]    Culture   Final    MODERATE STAPHYLOCOCCUS AUREUS Note: RIFAMPIN AND GENTAMICIN SHOULD NOT BE USED AS SINGLE DRUGS FOR TREATMENT OF STAPH INFECTIONS. Performed at [facility]    Report Status PENDING  Incomplete  Anaerobic culture     Status: None (Preliminary result)   Collection Time: 11/04/14 12:50 PM  Result Value Ref Range Status   Specimen Description   Final    FLUID RIGHT KNEE Performed at [facility]    Special Requests Normal  Final   Gram Stain PENDING  Incomplete   Culture PENDING  Incomplete   Report Status PENDING  Incomplete  Fungus culture w smear     Status: None (Preliminary result)   Collection Time: 11/04/14 12:50 PM  Result Value Ref Range Status   Specimen Description KNEE JOINT FLUID RIGHT  Final   Special Requests Normal  Final   Fungal Smear   Final    NO YEAST OR FUNGAL ELEMENTS SEEN Performed at [facility]    Culture   Final    CULTURE IN PROGRESS FOR FOUR WEEKS Performed at [facility]    Report Status PENDING  Incomplete  Body fluid culture     Status: None (Preliminary result)   Collection Time: 11/04/14 12:50 PM  Result Value Ref Range Status   Specimen Description FLUID RIGHT KNEE  Final   Special Requests NONE  Final   Gram Stain   Final    ABUNDANT WBC PRESENT,BOTH PMN AND MONONUCLEAR FEW GRAM POSITIVE COCCI IN PAIRS Performed at [facility]    Culture PENDING  Incomplete   Report Status PENDING  Incomplete    Radiology Reports Dg Knee 1-2 Views Right  11/04/2014   CLINICAL DATA:  Right knee drainage for 4 days. Previous right knee arthroplasty. No injury.  EXAM: RIGHT KNEE - 1-2 VIEW  COMPARISON:   09/09/2013  FINDINGS: Right total knee arthroplasty. Components appear grossly well-seated although oblique positioning of the lateral view limits evaluation of the femoral component. No evidence of acute fracture or dislocation. No significant effusion. Soft tissue swelling in the subcutaneous fat superficial to the patella may represent edema or cellulitis.  IMPRESSION: Subcutaneous soft tissue swelling. Right total knee arthroplasty appears grossly well seated. No acute fractures.   Electronically Signed   By: Burman Nieves M.D.   On: 11/04/2014 06:03   US Aspiration  11/04/2014   CLINICAL DATA:  PRIOR RIGHT KNEE ARTHROPLASTY, SEPTIC ARTHRITIS  EXAM: ULTRASOUND GUIDED NEEDLE ASPIRATE OF  RIGHT KNEE JOINT  MEDICATIONS: 1% lidocaine locally  PROCEDURE: The procedure, risks, benefits, and alternatives were explained to the patient. Questions regarding the procedure were encouraged and answered. The patient understands and consents to the procedure.  The medial right knee was prepped with ChloraPrep in a sterile fashion, and a sterile drape was applied covering the operative field. A sterile gown and sterile gloves were used for the procedure. Local anesthesia was provided with 1% Lidocaine.  Previous imaging reviewed. Preliminary ultrasound performed. Moderate right knee joint effusion noted. Under sterile conditions and local anesthesia, a 19 gauge sheath needle was advanced percutaneously under direct ultrasound into the medial aspect of the right knee joint. Syringe aspiration yielded 10 cc extubated blood tinged synovial fluid. Sample sent for Gram stain and culture. Needle removed.  COMPLICATIONS: None.  FINDINGS: Imaging confirms needle placed in in the right knee joint for aspiration  IMPRESSION: Successful ultrasound right knee joint aspiration. Sample sent for lab analysis and culture.   Electronically Signed   By: Judie Petit.  Shick M.D.   On: 11/04/2014 15:52     CBC  Recent Labs Lab 11/04/14 0507   WBC 7.7  HGB 11.0*  HCT 36.0  PLT 407*  MCV 68.3*  MCH 20.9*  MCHC 30.6  RDW 18.3*  LYMPHSABS 2.6  MONOABS 0.5  EOSABS 0.1  BASOSABS 0.1    Chemistries   Recent Labs Lab 11/04/14 0507  NA 139  K 4.6  CL 107  CO2 22  GLUCOSE 99  BUN 7  CREATININE 0.69  CALCIUM 9.7  AST 16  ALT 7*  ALKPHOS 101  BILITOT 0.6   ------------------------------------------------------------------------------------------------------------------ estimated creatinine clearance is 89.2 mL/min (by C-G formula based on Cr of 0.69). ------------------------------------------------------------------------------------------------------------------  Recent Labs  11/04/14 0507  HGBA1C 6.2*   ------------------------------------------------------------------------------------------------------------------ No results for input(s): CHOL, HDL, LDLCALC, TRIG, CHOLHDL, LDLDIRECT in the last 72 hours. ------------------------------------------------------------------------------------------------------------------ No results for input(s): TSH, T4TOTAL, T3FREE, THYROIDAB in the last 72 hours.  Invalid input(s): FREET3 ------------------------------------------------------------------------------------------------------------------ No results for input(s): VITAMINB12, FOLATE, FERRITIN, TIBC, IRON, RETICCTPCT in the last 72 hours.  Coagulation profile No results for input(s): INR, PROTIME in the last 168 hours.  No results for input(s): DDIMER in the last 72 hours.  Cardiac Enzymes No results for input(s): CKMB, TROPONINI, MYOGLOBIN in the last 168 hours.  Invalid input(s): CK ------------------------------------------------------------------------------------------------------------------ Invalid input(s): POCBNP   CBG: No results for input(s): GLUCAP in the last 168 hours.     Studies: Dg Knee 1-2 Views Right  11/04/2014   CLINICAL DATA:  Right knee drainage for 4 days. Previous right  knee arthroplasty. No injury.  EXAM: RIGHT KNEE - 1-2 VIEW  COMPARISON:  09/09/2013  FINDINGS: Right total knee arthroplasty. Components appear grossly well-seated although oblique positioning of the lateral view limits evaluation of the femoral component. No evidence of acute fracture or dislocation. No significant effusion. Soft tissue swelling in the subcutaneous fat superficial to the patella may represent edema or cellulitis.  IMPRESSION: Subcutaneous soft tissue swelling. Right total knee arthroplasty appears grossly well seated. No acute fractures.   Electronically Signed   By: Burman Nieves M.D.   On: 11/04/2014 06:03   US Aspiration  11/04/2014   CLINICAL DATA:  PRIOR RIGHT KNEE ARTHROPLASTY, SEPTIC ARTHRITIS  EXAM: ULTRASOUND GUIDED NEEDLE ASPIRATE OF RIGHT KNEE JOINT  MEDICATIONS: 1% lidocaine locally  PROCEDURE: The procedure, risks, benefits, and alternatives were explained to the patient. Questions regarding the procedure were encouraged and answered. The patient understands and consents  to the procedure.  The medial right knee was prepped with ChloraPrep in a sterile fashion, and a sterile drape was applied covering the operative field. A sterile gown and sterile gloves were used for the procedure. Local anesthesia was provided with 1% Lidocaine.  Previous imaging reviewed. Preliminary ultrasound performed. Moderate right knee joint effusion noted. Under sterile conditions and local anesthesia, a 19 gauge sheath needle was advanced percutaneously under direct ultrasound into the medial aspect of the right knee joint. Syringe aspiration yielded 10 cc extubated blood tinged synovial fluid. Sample sent for Gram stain and culture. Needle removed.  COMPLICATIONS: None.  FINDINGS: Imaging confirms needle placed in in the right knee joint for aspiration  IMPRESSION: Successful ultrasound right knee joint aspiration. Sample sent for lab analysis and culture.   Electronically Signed   By: Judie Petit.  Shick M.D.    On: 11/04/2014 15:52      Lab Results  Component Value Date   HGBA1C 6.2* 11/04/2014   HGBA1C 6.6* 12/09/2012   Lab Results  Component Value Date   CREATININE 0.69 11/04/2014       Scheduled Meds: . atorvastatin  40 mg Oral q morning - 10a  . docusate sodium  100 mg Oral BID  . enoxaparin (LOVENOX) injection  50 mg Subcutaneous Q24H  . irbesartan  150 mg Oral Daily  . nebivolol  10 mg Oral q morning - 10a  . sodium chloride  3 mL Intravenous Q12H  . vancomycin  1,250 mg Intravenous Q12H   Continuous Infusions: . sodium chloride 75 mL/hr at 11/04/14 0854    Active Problems:   Infection of prosthetic knee joint    Time spent: 45 minutes   [facility]  [facility] Pager [phone]. If 7PM-7AM, please contact night-coverage at [email or web address], password [facility] 11/05/2014, 10:49 AM  LOS: 1 day

## 2014-11-05 NOTE — Progress Notes (Signed)
Report called to Carney Bern on [facility].

## 2014-11-05 NOTE — Progress Notes (Signed)
Patient ID: [name] [name], female   DOB: 1950/07/13, 64 y.o.   MRN: [id number]         [facility] for Infectious Disease    Date of Admission:  11/04/2014           Day 2 vancomycin   Active Problems:   Infection of prosthetic knee joint   . atorvastatin  40 mg Oral q morning - 10a  . docusate sodium  100 mg Oral BID  . enoxaparin (LOVENOX) injection  50 mg Subcutaneous Q24H  . nebivolol  10 mg Oral q morning - 10a  . sodium chloride  3 mL Intravenous Q12H  . vancomycin  1,250 mg Intravenous Q12H    SUBJECTIVE: Her chronic right knee pain is unchanged. She did not have any fever, chills or sweats overnight.   Review of Systems: Pertinent items are noted in HPI.  Past Medical History  Diagnosis Date  . Hyperlipidemia   . Hypertension   . S/P right knee arthroscopy     with septic joint growing out MSSA  . Septic arthritis of knee, right   . Overactive bladder     Social History  Substance Use Topics  . Smoking status: Former Smoker    Quit date: 08/01/1992  . Smokeless tobacco: Never Used     Comment: pt. does not smoke  . Alcohol Use: No    No family history on file. No Known Allergies  OBJECTIVE: Filed Vitals:   11/04/14 1528 11/04/14 2241 11/05/14 0630 11/05/14 1440  BP: 145/63 192/81 165/80 186/68  Pulse: 78 78 74 73  Temp: 97.9 F (36.6 C) 98 F (36.7 C) 98.7 F (37.1 C) 97.5 F (36.4 C)  TempSrc: Oral Oral Oral Oral  Resp: Height:      Weight:      SpO2: 99% 100% 100% 100%   Body mass index is 43.15 kg/(m^2).  General: She is in good spirits. She is talking to a friend on the phone  Lungs: Clear  Cor: Regular S1 and S2 with no murmur  Right leg: Warm and swollen knee. No expressible drainage.   Lab Results Lab Results  Component Value Date   WBC 5.5 11/05/2014   HGB 10.7* 11/05/2014   HCT 35.8* 11/05/2014   MCV 69.4* 11/05/2014   PLT 360 11/05/2014    Lab Results  Component Value Date   CREATININE 0.49  11/05/2014   BUN <5* 11/05/2014   NA 140 11/05/2014   K 3.8 11/05/2014   CL 111 11/05/2014   CO2 23 11/05/2014    Lab Results  Component Value Date   ALT 8* 11/05/2014   AST 13* 11/05/2014   ALKPHOS 88 11/05/2014   BILITOT 0.7 11/05/2014   SED RATE (mm/hr)  Date Value  11/04/2014 45*  09/09/2013 120*  06/19/2013 12   CRP (mg/dL)  Date Value  11/91/4782 0.9  09/09/2013 11.8*  06/19/2013 <0.5     Microbiology: Recent Results (from the past 240 hour(s))  Blood culture (routine x 2)     Status: None (Preliminary result)   Collection Time: 11/04/14  5:07 AM  Result Value Ref Range Status   Specimen Description BLOOD RIGHT HAND  Final   Special Requests BOTTLES DRAWN AEROBIC ONLY 8.5ML  Final   Culture   Final    NO GROWTH 1 DAY Performed at [facility]    Report Status PENDING  Incomplete  Wound culture     Status: None (Preliminary  result)   Collection Time: 11/04/14  5:12 AM  Result Value Ref Range Status   Specimen Description WOUND KNEE  Final   Special Requests NONE  Final   Gram Stain   Final    NO WBC SEEN FEW SQUAMOUS EPITHELIAL CELLS PRESENT NO ORGANISMS SEEN Performed at [facility]    Culture   Final    MODERATE STAPHYLOCOCCUS AUREUS Note: RIFAMPIN AND GENTAMICIN SHOULD NOT BE USED AS SINGLE DRUGS FOR TREATMENT OF STAPH INFECTIONS. Performed at [facility]    Report Status PENDING  Incomplete  Blood culture (routine x 2)     Status: None (Preliminary result)   Collection Time: 11/04/14  5:21 AM  Result Value Ref Range Status   Specimen Description BLOOD LEFT HAND  Final   Special Requests BOTTLES DRAWN AEROBIC AND ANAEROBIC  Final   Culture   Final    NO GROWTH 1 DAY Performed at [facility]    Report Status PENDING  Incomplete  Anaerobic culture     Status: None (Preliminary result)   Collection Time: 11/04/14 12:50 PM  Result Value Ref Range Status   Specimen Description   Final    FLUID RIGHT  KNEE Performed at [facility]    Special Requests Normal  Final   Gram Stain PENDING  Incomplete   Culture PENDING  Incomplete   Report Status PENDING  Incomplete  Fungus culture w smear     Status: None (Preliminary result)   Collection Time: 11/04/14 12:50 PM  Result Value Ref Range Status   Specimen Description KNEE JOINT FLUID RIGHT  Final   Special Requests Normal  Final   Fungal Smear   Final    NO YEAST OR FUNGAL ELEMENTS SEEN Performed at [facility]    Culture   Final    CULTURE IN PROGRESS FOR FOUR WEEKS Performed at [facility]    Report Status PENDING  Incomplete  Body fluid culture     Status: None (Preliminary result)   Collection Time: 11/04/14 12:50 PM  Result Value Ref Range Status   Specimen Description FLUID RIGHT KNEE  Final   Special Requests NONE  Final   Gram Stain   Final    ABUNDANT WBC PRESENT,BOTH PMN AND MONONUCLEAR FEW GRAM POSITIVE COCCI IN PAIRS    Culture   Final    TOO YOUNG TO READ Performed at [facility]    Report Status PENDING  Incomplete     ASSESSMENT: Cultures of wound drainage are growing staph aureus. 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 I will continue vancomycin pending final culture results and have a PICC placed. She is due to transfer to [facility] tonight for surgery tomorrow.   PLAN: Continue vancomycin pending final culture results  PICC placement   Cliffton Asters, MD [facility] for Infectious Disease [facility] [phone] pager   [phone] cell 11/05/2014, 3:47 PM

## 2014-11-05 NOTE — H&P (Signed)
[name] [name] is an 64 y.o. female.  HPI: Patient with over one year now of right knee pain progressively worsening. Difficulty with weight bearing on the right leg, said she "pushes herself around the house in a wheeled desk chair", and transfers or very short distances with wheeled walker. Acutely now with drainage from right knee for the last 4 days. Previous knee replacements done by Dr. Amada Jupiter, one in 2011 on the right and then in 2012 on the left. At the time she presented with her infection, 2014, her cultures grew out staph aureus, Methicillin sensitive. She did well with initial debriding arthroscopy and then a course of IV antibiotics for six weeks and then p.o. antibiotics for almost six months. Her acute situation definitely settled down, but the lingering pain has never resolved. Because of the elevated SED rate, as well as C-reactive protein we pursued further workup with her bone scan. At this point in time I think there is definitely evidence of either some loosening of her components from her infection or a smoldering non-acute infectious process. Previous attempts to aspirate and culture her knee the end of last year, 2015, revealed a scant amount of bloody fluid, really no obvious purulence. Intraarticular Cortisone injection helpful, but only for a short time. The big issue here is lingering diffuse deep seated pain throughout her knee. Not systemically ill.   It was decided in March of this year patient needed single stage revision vs two stage with cement spacer and the plan was to proceed at that time. There were issues regarding clearance for surgery with uncontrolled diabetes and HTN and the surgery was cancelled at the time. Also appears to have some noncompliance as far as follow up with ortho as well as PCP regarding the above issues.   Past Medical History  Diagnosis Date  . Hyperlipidemia   . Hypertension   . S/P right knee arthroscopy      with septic joint growing out MSSA  . Septic arthritis of knee, right   . Overactive bladder     Past Surgical History  Procedure Laterality Date  . Joint replacement    . Knee surgery Bilateral   . Knee arthroscopy Right 12/09/2012    Procedure: ARTHROSCOPIC IRRIGATION AND DEBRIDEMENT RIGHT KNEE; Surgeon: Rozanna Box, MD; Location: [place]; Service: Orthopedics; Laterality: Right;    No family history on file.  Social History:  reports that she quit smoking about 22 years ago. She has never used smokeless tobacco. She reports that she does not drink alcohol or use illicit drugs.  Allergies: No Known Allergies  Medications: I have reviewed the patient's current medications.   Lab Results Last 48 Hours    Results for orders placed or performed during the hospital encounter of 11/04/14 (from the past 48 hour(s))  CBC with Differential/Platelet Status: Abnormal   Collection Time: 11/04/14 5:07 AM  Result Value Ref Range   WBC 7.7 4.0 - 10.5 K/uL   RBC 5.27 (H) 3.87 - 5.11 MIL/uL   Hemoglobin 11.0 (L) 12.0 - 15.0 g/dL   HCT 36.0 36.0 - 46.0 %   MCV 68.3 (L) 78.0 - 100.0 fL   MCH 20.9 (L) 26.0 - 34.0 pg   MCHC 30.6 30.0 - 36.0 g/dL   RDW 18.3 (H) 11.5 - 15.5 %   Platelets 407 (H) 150 - 400 K/uL   Neutrophils Relative % 57 %   Lymphocytes Relative 34 %   Monocytes Relative 7 %   Eosinophils  Relative 1 %   Basophils Relative 1 %   Neutro Abs 4.4 1.7 - 7.7 K/uL   Lymphs Abs 2.6 0.7 - 4.0 K/uL   Monocytes Absolute 0.5 0.1 - 1.0 K/uL   Eosinophils Absolute 0.1 0.0 - 0.7 K/uL   Basophils Absolute 0.1 0.0 - 0.1 K/uL   Smear Review MORPHOLOGY UNREMARKABLE   Basic metabolic panel Status: None   Collection Time: 11/04/14 5:07 AM  Result Value Ref Range   Sodium 139 135 - 145 mmol/L   Potassium 4.6 3.5 - 5.1 mmol/L   Chloride 107  101 - 111 mmol/L   CO2 22 22 - 32 mmol/L   Glucose, Bld 99 65 - 99 mg/dL   BUN 7 6 - 20 mg/dL   Creatinine, Ser 0.69 0.44 - 1.00 mg/dL   Calcium 9.7 8.9 - 10.3 mg/dL   GFR calc non Af Amer >60 >60 mL/min   GFR calc Af Amer >60 >60 mL/min    Comment: (NOTE) The eGFR has been calculated using the CKD EPI equation. This calculation has not been validated in all clinical situations. eGFR's persistently <60 mL/min signify possible Chronic Kidney Disease.    Anion gap 10 5 - 15  C-reactive protein Status: None   Collection Time: 11/04/14 5:07 AM  Result Value Ref Range   CRP 0.9 <1.0 mg/dL    Comment: Performed at [facility]  Sedimentation rate Status: Abnormal   Collection Time: 11/04/14 5:07 AM  Result Value Ref Range   Sed Rate 45 (H) 0 - 22 mm/hr  Hepatic function panel Status: Abnormal   Collection Time: 11/04/14 5:07 AM  Result Value Ref Range   Total Protein 8.7 (H) 6.5 - 8.1 g/dL   Albumin 3.6 3.5 - 5.0 g/dL   AST 16 15 - 41 U/L   ALT 7 (L) 14 - 54 U/L   Alkaline Phosphatase 101 38 - 126 U/L   Total Bilirubin 0.6 0.3 - 1.2 mg/dL   Bilirubin, Direct 0.2 0.1 - 0.5 mg/dL   Indirect Bilirubin 0.4 0.3 - 0.9 mg/dL       Imaging Results (Last 48 hours)    Dg Knee 1-2 Views Right  11/04/2014 CLINICAL DATA: Right knee drainage for 4 days. Previous right knee arthroplasty. No injury. EXAM: RIGHT KNEE - 1-2 VIEW COMPARISON: 09/09/2013 FINDINGS: Right total knee arthroplasty. Components appear grossly well-seated although oblique positioning of the lateral view limits evaluation of the femoral component. No evidence of acute fracture or dislocation. No significant effusion. Soft tissue swelling in the subcutaneous fat superficial to the patella may represent edema or cellulitis. IMPRESSION: Subcutaneous soft tissue swelling. Right total knee arthroplasty  appears grossly well seated. No acute fractures. Electronically Signed By: Lucienne Capers M.D. On: 11/04/2014 06:03     Review of Systems  Constitutional: Positive for chills. Negative for fever.  HENT: Negative.  Eyes: Negative.  Respiratory: Negative.  Cardiovascular: Negative.  Gastrointestinal: Negative.  Genitourinary: Negative.  Musculoskeletal: Positive for joint pain. Negative for falls.  Skin:   Flaking/dry skin right knee now with "pus like" drainage  Neurological: Negative.  Endo/Heme/Allergies: Negative.  Psychiatric/Behavioral: Negative.   Blood pressure 182/63, pulse 81, temperature 98.1 F (36.7 C), temperature source Oral, resp. rate 17, height _0  (1.626 m), weight 114.08 kg (251 lb 8 oz), SpO2 98 %. Physical Exam  Constitutional: She is oriented to person, place, and time. She appears well-developed and well-nourished. No distress.  Morbidly obese  HENT:  Head: Normocephalic and atraumatic.  Neck: Normal range of motion.  Cardiovascular: Normal rate and intact distal pulses.  Respiratory: Effort normal and breath sounds normal. No respiratory distress. She has no wheezes.  Musculoskeletal:   Right knee: She exhibits decreased range of motion, swelling, effusion, erythema and bony tenderness. She exhibits no ecchymosis, no LCL laxity and no MCL laxity. Tenderness found. Medial joint line and lateral joint line tenderness noted.  Significant pain with any attempt of passive ROM right knee  Lymphadenopathy:   She has no cervical adenopathy.  Neurological: She is alert and oriented to person, place, and time. No cranial nerve deficit.  Skin: Skin is warm and dry. There is erythema.     Psychiatric: She has a normal mood and affect. Her behavior is normal.    Assessment/Plan: Infected right total knee   Long standing infected right total knee. This has been worked up and treatment discussed several months ago, documentation in  our office from March recommend one stage vs two revision total knee for infection. She had not been systemically ill, but now presenting with drainage. There were some issues regarding surgical clearance since March due to uncontrolled diabetes and HTN, I believe she even changed PCP. Have discussed with Dr. Amada Jupiter and ortho team, do feel she needs 2 stage revision, first being I&D hardware removal and placement of antibiotic cement spacer. We will proceed with 2 stage surgical intervention this Wednesday.  Please hold Lovenox day before surgery and make NPO after midnight.

## 2014-11-05 NOTE — Progress Notes (Signed)
Patient ID: [name] [name], female   DOB: [birth year], 64 y.o.   MRN: [id number]   Patient to be transferred to [facility] today for right knee surgery tomorrow Please hold Lovenox today Make NPO after midnight  Please call with questions -Tessa Lerner, PA-C [phone]

## 2014-11-05 NOTE — Progress Notes (Signed)
ANTIBIOTIC CONSULT NOTE  Pharmacy Consult for Vancomycin Indication: R/O septic knee  No Known Allergies  Patient Measurements: Height:  (162.6 cm) Weight: 251 lb 8 oz (114.08 kg) IBW/kg (Calculated) : 54.7  Vital Signs: Temp: 97.5 F (36.4 C) (09/20 1440) Temp Source: Oral (09/20 1440) BP: 186/68 mmHg (09/20 1440) Pulse Rate: 73 (09/20 1440) Intake/Output from previous day: 09/19 0701 - 09/20 0700 In: 360 [P.O.:360] Out: -  Intake/Output from this shift: Total I/O In: 240 [P.O.:240] Out: -   Labs:  Recent Labs  11/04/14 0507 11/05/14 1357  WBC 7.7 5.5  HGB 11.0* 10.7*  PLT 407* 360  CREATININE 0.69 0.49   Estimated Creatinine Clearance: 89.2 mL/min (by C-G formula based on Cr of 0.49). No results for input(s): VANCOTROUGH, VANCOPEAK, VANCORANDOM, GENTTROUGH, GENTPEAK, GENTRANDOM, TOBRATROUGH, TOBRAPEAK, TOBRARND, AMIKACINPEAK, AMIKACINTROU, AMIKACIN in the last 72 hours.   Microbiology: Recent Results (from the past 720 hour(s))  Blood culture (routine x 2)     Status: None (Preliminary result)   Collection Time: 11/04/14  5:07 AM  Result Value Ref Range Status   Specimen Description BLOOD RIGHT HAND  Final   Special Requests BOTTLES DRAWN AEROBIC ONLY 8.5ML  Final   Culture   Final    NO GROWTH 1 DAY Performed at [facility]    Report Status PENDING  Incomplete  Wound culture     Status: None (Preliminary result)   Collection Time: 11/04/14  5:12 AM  Result Value Ref Range Status   Specimen Description WOUND KNEE  Final   Special Requests NONE  Final   Gram Stain   Final    NO WBC SEEN FEW SQUAMOUS EPITHELIAL CELLS PRESENT NO ORGANISMS SEEN Performed at [facility]    Culture   Final    MODERATE STAPHYLOCOCCUS AUREUS Note: RIFAMPIN AND GENTAMICIN SHOULD NOT BE USED AS SINGLE DRUGS FOR TREATMENT OF STAPH INFECTIONS. Performed at [facility]    Report Status PENDING  Incomplete  Blood culture (routine x 2)      Status: None (Preliminary result)   Collection Time: 11/04/14  5:21 AM  Result Value Ref Range Status   Specimen Description BLOOD LEFT HAND  Final   Special Requests BOTTLES DRAWN AEROBIC AND ANAEROBIC  Final   Culture   Final    NO GROWTH 1 DAY Performed at [facility]    Report Status PENDING  Incomplete  Anaerobic culture     Status: None (Preliminary result)   Collection Time: 11/04/14 12:50 PM  Result Value Ref Range Status   Specimen Description   Final    FLUID RIGHT KNEE Performed at [facility]    Special Requests Normal  Final   Gram Stain PENDING  Incomplete   Culture PENDING  Incomplete   Report Status PENDING  Incomplete  Fungus culture w smear     Status: None (Preliminary result)   Collection Time: 11/04/14 12:50 PM  Result Value Ref Range Status   Specimen Description KNEE JOINT FLUID RIGHT  Final   Special Requests Normal  Final   Fungal Smear   Final    NO YEAST OR FUNGAL ELEMENTS SEEN Performed at [facility]    Culture   Final    CULTURE IN PROGRESS FOR FOUR WEEKS Performed at [facility]    Report Status PENDING  Incomplete  Body fluid culture     Status: None (Preliminary result)   Collection Time: 11/04/14 12:50 PM  Result Value Ref Range Status   Specimen Description FLUID RIGHT KNEE  Final   Special Requests NONE  Final   Gram Stain   Final    ABUNDANT WBC PRESENT,BOTH PMN AND MONONUCLEAR FEW GRAM POSITIVE COCCI IN PAIRS    Culture   Final    TOO YOUNG TO READ Performed at [facility]    Report Status PENDING  Incomplete    Assessment: 64 yo obese F who presents to ED with pain, swelling, redness of R knee that began draining several days ago.  She has hx B TKA and subsequent R knee MSSA infection in 2014 for which she completed 6 weeks of IV Ancef.   She is afebrile and WBC WNL.  Renal function is stable.   9/19 Ancef x1 dose in ED 9/19 >> Vanc >>    9/19 blood x2: NGTD 9/19 Wound  (knee): moderate Staph aureus 9/19 Fungus culture (R knee joint fluid): no yeast or fungal elements seen, cx in progress x 4 weeks)   Goal of Therapy:  Vancomycin trough level 15-20 mcg/ml  Plan:  Plan to transfer to [facility] today for I&D, hardware removal, and placement of antibiotic cement spacer on 9/21 (surgery scheduled ~ 1330). Vancomycin 1500 mg IV x 1 has been ordered per MD pre-operatively per Vancomycin pre-op dosing protocol.   Continue Vancomycin 1250 mg IV q12h through last dose at 2200 tonight.  Then give pre-op dose of Vancomycin 1500 mg IV x 1 dose tomorrow prior to surgery.  Resume Vancomycin 1250 mg IV q12h twelve hours after pre-op dose tomorrow.  Check Vancomycin trough level at steady state  Monitor renal function and cx data   Greer Pickerel, PharmD, BCPS Pager: [phone] 11/05/2014 5:44 PM

## 2014-11-06 ENCOUNTER — Encounter (HOSPITAL_COMMUNITY)
Admission: EM | Disposition: A | Discharge status: Skilled Nursing Facility | Payer: Self-pay | Source: Home / Self Care | Attending: Internal Medicine

## 2014-11-06 ENCOUNTER — Inpatient Hospital Stay (HOSPITAL_COMMUNITY): Payer: Medicare Other | Admitting: Anesthesiology

## 2014-11-06 ENCOUNTER — Inpatient Hospital Stay (HOSPITAL_COMMUNITY): Admission: RE | Admit: 2014-11-06 | Payer: Medicare Other | Source: Ambulatory Visit | Admitting: Orthopedic Surgery

## 2014-11-06 ENCOUNTER — Encounter (HOSPITAL_COMMUNITY): Payer: Self-pay | Admitting: *Deleted

## 2014-11-06 DIAGNOSIS — T8450XS Infection and inflammatory reaction due to unspecified internal joint prosthesis, sequela: Secondary | ICD-10-CM

## 2014-11-06 DIAGNOSIS — I10 Essential (primary) hypertension: Secondary | ICD-10-CM

## 2014-11-06 DIAGNOSIS — E785 Hyperlipidemia, unspecified: Secondary | ICD-10-CM

## 2014-11-06 HISTORY — PX: REIMPLANTATION OF CEMENTED SPACER KNEE: SHX6050

## 2014-11-06 HISTORY — PX: TOTAL KNEE REVISION: SHX996

## 2014-11-06 LAB — WOUND CULTURE: Gram Stain: NONE SEEN

## 2014-11-06 LAB — SURGICAL PCR SCREEN
MRSA, PCR: NEGATIVE
Staphylococcus aureus: NEGATIVE

## 2014-11-06 SURGERY — TOTAL KNEE REVISION
Anesthesia: Regional | Site: Knee | Laterality: Right

## 2014-11-06 MED ORDER — GLYCOPYRROLATE 0.2 MG/ML IJ SOLN
INTRAMUSCULAR | Status: DC | PRN
  Administered 2014-11-06: 0.6 mg via INTRAVENOUS

## 2014-11-06 MED ORDER — NEOSTIGMINE METHYLSULFATE 10 MG/10ML IV SOLN
INTRAVENOUS | Status: DC | PRN
  Administered 2014-11-06: 5 mg via INTRAVENOUS

## 2014-11-06 MED ORDER — PROPOFOL 10 MG/ML IV BOLUS
INTRAVENOUS | Status: AC
  Filled 2014-11-06: qty 20

## 2014-11-06 MED ORDER — MIDAZOLAM HCL 2 MG/2ML IJ SOLN
1.0000 mg | Freq: Once | INTRAMUSCULAR | Status: AC
  Administered 2014-11-06: 1 mg via INTRAVENOUS

## 2014-11-06 MED ORDER — MIDAZOLAM HCL 5 MG/ML IJ SOLN: 1.0000 mg | Freq: Once | INTRAMUSCULAR | Status: DC

## 2014-11-06 MED ORDER — LABETALOL HCL 5 MG/ML IV SOLN
INTRAVENOUS | Status: DC | PRN
  Administered 2014-11-06: 5 mg via INTRAVENOUS

## 2014-11-06 MED ORDER — FENTANYL CITRATE (PF) 100 MCG/2ML IJ SOLN
INTRAMUSCULAR | Status: DC | PRN
  Administered 2014-11-06: 100 ug via INTRAVENOUS
  Administered 2014-11-06 (×7): 50 ug via INTRAVENOUS
  Administered 2014-11-06: 100 ug via INTRAVENOUS

## 2014-11-06 MED ORDER — PROPOFOL 10 MG/ML IV BOLUS
INTRAVENOUS | Status: DC | PRN
  Administered 2014-11-06: 150 mg via INTRAVENOUS
  Administered 2014-11-06: 50 mg via INTRAVENOUS

## 2014-11-06 MED ORDER — ONDANSETRON HCL 4 MG/2ML IJ SOLN
INTRAMUSCULAR | Status: DC | PRN
  Administered 2014-11-06: 4 mg via INTRAVENOUS

## 2014-11-06 MED ORDER — VANCOMYCIN HCL 10 G IV SOLR
1250.0000 mg | INTRAVENOUS | Status: DC
  Filled 2014-11-06: qty 1250

## 2014-11-06 MED ORDER — BUPIVACAINE-EPINEPHRINE (PF) 0.5% -1:200000 IJ SOLN
INTRAMUSCULAR | Status: DC | PRN
  Administered 2014-11-06: 30 mL via PERINEURAL

## 2014-11-06 MED ORDER — BUPIVACAINE LIPOSOME 1.3 % IJ SUSP
20.0000 mL | Freq: Once | INTRAMUSCULAR | Status: DC
  Filled 2014-11-06: qty 20

## 2014-11-06 MED ORDER — HYDRALAZINE HCL 20 MG/ML IJ SOLN
INTRAMUSCULAR | Status: AC
  Filled 2014-11-06: qty 1

## 2014-11-06 MED ORDER — FENTANYL CITRATE (PF) 100 MCG/2ML IJ SOLN
50.0000 ug | Freq: Once | INTRAMUSCULAR | Status: AC
  Administered 2014-11-06: 50 ug via INTRAVENOUS

## 2014-11-06 MED ORDER — MIDAZOLAM HCL 5 MG/5ML IJ SOLN
INTRAMUSCULAR | Status: DC | PRN
  Administered 2014-11-06: 2 mg via INTRAVENOUS

## 2014-11-06 MED ORDER — SODIUM CHLORIDE 0.9 % IR SOLN
Status: DC | PRN
  Administered 2014-11-06: 1000 mL
  Administered 2014-11-06 (×3): 3000 mL

## 2014-11-06 MED ORDER — HYDROMORPHONE HCL 1 MG/ML IJ SOLN
INTRAMUSCULAR | Status: AC
  Administered 2014-11-06: 0.5 mg via INTRAVENOUS
  Filled 2014-11-06: qty 1

## 2014-11-06 MED ORDER — LACTATED RINGERS IV SOLN
INTRAVENOUS | Status: DC
  Administered 2014-11-07: 21:00:00 via INTRAVENOUS

## 2014-11-06 MED ORDER — FENTANYL CITRATE (PF) 250 MCG/5ML IJ SOLN
INTRAMUSCULAR | Status: AC
  Filled 2014-11-06: qty 5

## 2014-11-06 MED ORDER — SALINE FLUSH 0.9 % IV SOLN
INTRAVENOUS | Status: DC | PRN
  Administered 2014-11-06: 40 mL via SURGICAL_CAVITY

## 2014-11-06 MED ORDER — ROCURONIUM BROMIDE 100 MG/10ML IV SOLN
INTRAVENOUS | Status: DC | PRN
  Administered 2014-11-06: 50 mg via INTRAVENOUS
  Administered 2014-11-06: 10 mg via INTRAVENOUS

## 2014-11-06 MED ORDER — HYDROMORPHONE HCL 1 MG/ML IJ SOLN
0.2500 mg | INTRAMUSCULAR | Status: DC | PRN
  Administered 2014-11-06 (×6): 0.5 mg via INTRAVENOUS

## 2014-11-06 MED ORDER — SUCCINYLCHOLINE CHLORIDE 20 MG/ML IJ SOLN
INTRAMUSCULAR | Status: DC | PRN
  Administered 2014-11-06: 120 mg via INTRAVENOUS

## 2014-11-06 MED ORDER — BUPIVACAINE LIPOSOME 1.3 % IJ SUSP
INTRAMUSCULAR | Status: DC | PRN
  Administered 2014-11-06: 20 mL

## 2014-11-06 MED ORDER — OXYCODONE-ACETAMINOPHEN 5-325 MG PO TABS: 1.0000 | ORAL_TABLET | ORAL | Status: DC | PRN

## 2014-11-06 MED ORDER — PHENYLEPHRINE HCL 10 MG/ML IJ SOLN
INTRAMUSCULAR | Status: DC | PRN
  Administered 2014-11-06: 40 ug via INTRAVENOUS
  Administered 2014-11-06 (×2): 80 ug via INTRAVENOUS

## 2014-11-06 MED ORDER — MIDAZOLAM HCL 2 MG/2ML IJ SOLN
INTRAMUSCULAR | Status: AC
  Filled 2014-11-06: qty 4

## 2014-11-06 MED ORDER — LIDOCAINE HCL (CARDIAC) 20 MG/ML IV SOLN
INTRAVENOUS | Status: DC | PRN
  Administered 2014-11-06: 40 mg via INTRAVENOUS

## 2014-11-06 MED ORDER — ENOXAPARIN SODIUM 30 MG/0.3ML ~~LOC~~ SOLN: 30.0000 mg | Freq: Two times a day (BID) | SUBCUTANEOUS | Status: DC

## 2014-11-06 SURGICAL SUPPLY — 80 items
BANDAGE ELASTIC 4 VELCRO ST LF (GAUZE/BANDAGES/DRESSINGS) ×3 IMPLANT
BANDAGE ELASTIC 6 VELCRO ST LF (GAUZE/BANDAGES/DRESSINGS) ×3 IMPLANT
BANDAGE ESMARK 6X9 LF (GAUZE/BANDAGES/DRESSINGS) ×1 IMPLANT
BENZOIN TINCTURE PRP APPL 2/3 (GAUZE/BANDAGES/DRESSINGS) ×3 IMPLANT
BLADE SAG 18X100X1.27 (BLADE) ×3 IMPLANT
BLADE SAW SGTL 13.0X1.19X90.0M (BLADE) ×3 IMPLANT
BLADE SURG ROTATE 9660 (MISCELLANEOUS) IMPLANT
BNDG ESMARK 6X9 LF (GAUZE/BANDAGES/DRESSINGS) ×3
BONE CEMENT SIMPLEX TOBRAMYCIN (Cement) ×4 IMPLANT
BOWL SMART MIX CTS (DISPOSABLE) IMPLANT
CEMENT BONE SIMPLEX TOBRAMYCIN (Cement) ×2 IMPLANT
CLOSURE STERI-STRIP 1/2X4 (GAUZE/BANDAGES/DRESSINGS) ×1
CLSR STERI-STRIP ANTIMIC 1/2X4 (GAUZE/BANDAGES/DRESSINGS) ×2 IMPLANT
CMPNT FEMORAL MED 64MM (Knees) ×1 IMPLANT
COMPONENT FEMORAL MED 64MM (Knees) ×1 IMPLANT
COMPONENT TIBIAL LRG (Knees) ×1 IMPLANT
COVER SURGICAL LIGHT HANDLE (MISCELLANEOUS) ×3 IMPLANT
CUFF TOURNIQUET SINGLE 34IN LL (TOURNIQUET CUFF) IMPLANT
DRAPE EXTREMITY T 121X128X90 (DRAPE) ×3 IMPLANT
DRAPE IMP U-DRAPE 54X76 (DRAPES) ×3 IMPLANT
DRAPE U-SHAPE 47X51 STRL (DRAPES) ×3 IMPLANT
DRSG PAD ABDOMINAL 8X10 ST (GAUZE/BANDAGES/DRESSINGS) ×3 IMPLANT
DURAPREP 26ML APPLICATOR (WOUND CARE) ×6 IMPLANT
ELECT CAUTERY BLADE 6.4 (BLADE) ×3 IMPLANT
ELECT REM PT RETURN 9FT ADLT (ELECTROSURGICAL) ×3
ELECTRODE REM PT RTRN 9FT ADLT (ELECTROSURGICAL) ×1 IMPLANT
EVACUATOR 1/8 PVC DRAIN (DRAIN) ×3 IMPLANT
FACESHIELD WRAPAROUND (MASK) ×3 IMPLANT
FEMORAL COMPONENT MEDIUM (Knees) ×2 IMPLANT
GAUZE SPONGE 4X4 12PLY STRL (GAUZE/BANDAGES/DRESSINGS) ×9 IMPLANT
GAUZE XEROFORM 1X8 LF (GAUZE/BANDAGES/DRESSINGS) ×3 IMPLANT
GLOVE BIO SURGEON STRL SZ 6.5 (GLOVE) ×2 IMPLANT
GLOVE BIO SURGEONS STRL SZ 6.5 (GLOVE) ×1
GLOVE BIOGEL PI IND STRL 7.0 (GLOVE) ×2 IMPLANT
GLOVE BIOGEL PI INDICATOR 7.0 (GLOVE) ×4
GLOVE ECLIPSE 7.0 STRL STRAW (GLOVE) ×3 IMPLANT
GLOVE ORTHO TXT STRL SZ7.5 (GLOVE) ×3 IMPLANT
GLOVE SURG SS PI 7.0 STRL IVOR (GLOVE) ×3 IMPLANT
GOWN STRL REUS W/ TWL LRG LVL3 (GOWN DISPOSABLE) ×4 IMPLANT
GOWN STRL REUS W/ TWL XL LVL3 (GOWN DISPOSABLE) ×1 IMPLANT
GOWN STRL REUS W/TWL LRG LVL3 (GOWN DISPOSABLE) ×8
GOWN STRL REUS W/TWL XL LVL3 (GOWN DISPOSABLE) ×2
HANDPIECE INTERPULSE COAX TIP (DISPOSABLE)
IMMOBILIZER KNEE 22 UNIV (SOFTGOODS) ×3 IMPLANT
KIT BASIN OR (CUSTOM PROCEDURE TRAY) ×3 IMPLANT
KIT ROOM TURNOVER OR (KITS) ×3 IMPLANT
MANIFOLD NEPTUNE II (INSTRUMENTS) ×3 IMPLANT
NEEDLE 18GX1X1/2 (RX/OR ONLY) (NEEDLE) ×3 IMPLANT
NEEDLE 22X1 1/2 (OR ONLY) (NEEDLE) ×3 IMPLANT
NS IRRIG 1000ML POUR BTL (IV SOLUTION) ×3 IMPLANT
PACK TOTAL JOINT (CUSTOM PROCEDURE TRAY) ×3 IMPLANT
PACK UNIVERSAL I (CUSTOM PROCEDURE TRAY) ×3 IMPLANT
PAD ARMBOARD 7.5X6 YLW CONV (MISCELLANEOUS) ×6 IMPLANT
PAD CAST 4YDX4 CTTN HI CHSV (CAST SUPPLIES) ×1 IMPLANT
PADDING CAST COTTON 4X4 STRL (CAST SUPPLIES) ×2
PADDING CAST COTTON 6X4 STRL (CAST SUPPLIES) ×3 IMPLANT
REMEDY TIBIAL COMPONENT  SIZE MEDIUM ×3 IMPLANT
SET HNDPC FAN SPRY TIP SCT (DISPOSABLE) IMPLANT
SPONGE LAP 18X18 X RAY DECT (DISPOSABLE) ×6 IMPLANT
SUCTION FRAZIER TIP 10 FR DISP (SUCTIONS) ×3 IMPLANT
SUT MNCRL AB 4-0 PS2 18 (SUTURE) ×3 IMPLANT
SUT VIC AB 0 CT1 27 (SUTURE) ×2
SUT VIC AB 0 CT1 27XBRD ANBCTR (SUTURE) ×1 IMPLANT
SUT VIC AB 1 CT1 27 (SUTURE) ×2
SUT VIC AB 1 CT1 27XBRD ANBCTR (SUTURE) ×1 IMPLANT
SUT VIC AB 1 CTX 36 (SUTURE) ×2
SUT VIC AB 1 CTX36XBRD ANBCTR (SUTURE) ×1 IMPLANT
SUT VIC AB 2-0 CT1 27 (SUTURE) ×4
SUT VIC AB 2-0 CT1 TAPERPNT 27 (SUTURE) ×2 IMPLANT
SYR 50ML LL SCALE MARK (SYRINGE) ×3 IMPLANT
TIBIAL COMPONENT LRG (Knees) ×3 IMPLANT
TOWEL OR 17X24 6PK STRL BLUE (TOWEL DISPOSABLE) ×3 IMPLANT
TOWEL OR 17X26 10 PK STRL BLUE (TOWEL DISPOSABLE) ×3 IMPLANT
TRAY CATH 16FR W/PLASTIC CATH (SET/KITS/TRAYS/PACK) ×3 IMPLANT
TRAY FOLEY CATH 16FRSI W/METER (SET/KITS/TRAYS/PACK) IMPLANT
TUBE ANAEROBIC SPECIMEN COL (MISCELLANEOUS) IMPLANT
TUBE CONNECTING 12'X1/4 (SUCTIONS) ×1
TUBE CONNECTING 12X1/4 (SUCTIONS) ×2 IMPLANT
WATER STERILE IRR 1000ML POUR (IV SOLUTION) ×3 IMPLANT
YANKAUER SUCT BULB TIP NO VENT (SUCTIONS) ×3 IMPLANT

## 2014-11-06 NOTE — Transfer of Care (Signed)
Immediate Anesthesia Transfer of Care Note  Patient: [name] [name]  Procedure(s) Performed: Procedure(s): REMOVAL OF TOTAL COMPONENTS, EXTENSIVE  IRRIGATION AND DEBRIDEMENT  RIGHT KNEE (Right) PLACEMENT OF CEMENT SPACER (Right)  Patient Location: PACU  Anesthesia Type:General  Level of Consciousness: awake, alert , oriented and patient cooperative  Airway & Oxygen Therapy: Patient Spontanous Breathing and Patient connected to face mask oxygen  Post-op Assessment: Report given to RN and Post -op Vital signs reviewed and stable  Post vital signs: Reviewed and stable  Last Vitals:  BP 177/100 HR 85 SpO2 97 on 4L [place] RR 15 Resting comfortably, without complaints of pain. Maintaining good airway.  Complications: No apparent anesthesia complications

## 2014-11-06 NOTE — Progress Notes (Signed)
[facility] Progress Note   [name] [name]  ZOX:[id number]  DOB: 06/07/1950  DOA: 11/04/2014 PCP: Lupita Raider, MD  Brief narrative: [name] [name] is a 64 y.o. female with history of bilateral knee replacements, septic arthritis of the right knee, hypertension, hyperlipidemia who presents with 4 days of right knee pain swelling warmth and drainage. In the past septic arthritis was noted to be secondary to MSSA- she was bacteremic as well and was treated with 6 weeks of Ancef followed by 6 months of Keflex.   Subjective: Continues to have some right knee pain. Ongoing discharge. No complaint of fever chills nausea vomiting abdominal pain  Assessment/Plan: Principal Problem:   Infection of prosthetic knee joint -Has history of MSSA infection of same knee as mentioned above -Plan orthopedics for OR today for irrigation and debridement -Continue antibiotics per ID-wound culture growing MSSA -PICC line to be placed  Active Problems:   Hyperlipidemia -Continue statin    Hypertension -Continue nebivolol    Code Status:     Code Status Orders        Start     Ordered   11/04/14 0732  Full code   Continuous     11/04/14 0732     Family Communication:  Disposition Plan: PT eval post surgery to determine if she is safe to go home DVT prophylaxis: Per ortho Consultants: Orthopedic surgery, infectious disease Procedures:  Antibiotics: Anti-infectives    Start     Dose/Rate Route Frequency Ordered Stop   11/07/14 0100  [MAR Hold]  vancomycin (VANCOCIN) 1,250 mg in sodium chloride 0.9 % 250 mL IVPB     (MAR Hold since 11/06/14 1223)   1,250 mg 166.7 mL/hr over 90 Minutes Intravenous Every 12 hours 11/05/14 1745     11/06/14 1500  vancomycin (VANCOCIN) 1,250 mg in sodium chloride 0.9 % 250 mL IVPB     1,250 mg 166.7 mL/hr over 90 Minutes Intravenous To Surgery 11/06/14 1451 11/07/14 1500   11/04/14 2200  vancomycin (VANCOCIN) 1,250 mg in sodium chloride 0.9 % 250  mL IVPB     1,250 mg 166.7 mL/hr over 90 Minutes Intravenous Every 12 hours 11/04/14 0846 11/06/14 0147   11/04/14 0745  vancomycin (VANCOCIN) 2,000 mg in sodium chloride 0.9 % 500 mL IVPB     2,000 mg 250 mL/hr over 120 Minutes Intravenous  Once 11/04/14 0738 11/04/14 1056   11/04/14 0645  ceFAZolin (ANCEF) IVPB 2 g/50 mL premix     2 g 100 mL/hr over 30 Minutes Intravenous  Once 11/04/14 0634 11/04/14 0745      Objective: Filed Weights   11/04/14 0825  Weight: 114.08 kg (251 lb 8 oz)    Intake/Output Summary (Last 24 hours) at 11/06/14 1616 Last data filed at 11/06/14 1611  Gross per 24 hour  Intake 2968.33 ml  Output    250 ml  Net 2718.33 ml     Vitals Filed Vitals:   11/05/14 1440 11/05/14 2115 11/06/14 0133 11/06/14 0525  BP: 186/68 142/82 141/69 163/98  Pulse: 73 77 78 81  Temp: 97.5 F (36.4 C) 97.8 F (36.6 C) 98 F (36.7 C) 97.8 F (36.6 C)  TempSrc: Oral Oral Oral Oral  Resp: Height:      Weight:      SpO2: 100% 100% 96% 100%    Exam:  General:  Pt is alert, not in acute distress  HEENT: No icterus, No thrush, oral mucosa moist  Cardiovascular: regular rate and rhythm,  S1/S2 No murmur  Respiratory: clear to auscultation bilaterally   Abdomen: Soft, +Bowel sounds, non tender, non distended, no guarding  MSK: No LE cyanosis or clubbing-right knee swollen erythematous tender with bloody drainage on dressing.  Data Reviewed: Basic Metabolic Panel:  Recent Labs Lab 11/04/14 0507 11/05/14 1357 11/05/14 2245  NA 139 140 140  K 4.6 3.8 4.0  CL 107 111 110  CO2 GLUCOSE 99 103* 107*  BUN 7 <5* 8  CREATININE 0.69 0.49 0.85  CALCIUM 9.7 9.2 9.2   Liver Function Tests:  Recent Labs Lab 11/04/14 0507 11/05/14 1357 11/05/14 2245  AST 16 13* 17  ALT 7* 8* 8*  ALKPHOS 101 88 95  BILITOT 0.6 0.7 0.5  PROT 8.7* 7.8 8.3*  ALBUMIN 3.6 3.2* 3.2*   No results for input(s): LIPASE, AMYLASE in the last 168 hours. No  results for input(s): AMMONIA in the last 168 hours. CBC:  Recent Labs Lab 11/04/14 0507 11/05/14 1357 11/05/14 2245  WBC 7.7 5.5 6.6  NEUTROABS 4.4  --  4.1  HGB 11.0* 10.7* 11.2*  HCT 36.0 35.8* 37.3  MCV 68.3* 69.4* 70.2*  PLT 407* 360 379   Cardiac Enzymes: No results for input(s): CKTOTAL, CKMB, CKMBINDEX, TROPONINI in the last 168 hours. BNP (last 3 results) No results for input(s): BNP in the last 8760 hours.  ProBNP (last 3 results) No results for input(s): PROBNP in the last 8760 hours.  CBG: No results for input(s): GLUCAP in the last 168 hours.  Recent Results (from the past 240 hour(s))  Blood culture (routine x 2)     Status: None (Preliminary result)   Collection Time: 11/04/14  5:07 AM  Result Value Ref Range Status   Specimen Description BLOOD RIGHT HAND  Final   Special Requests BOTTLES DRAWN AEROBIC ONLY 8.5ML  Final   Culture   Final    NO GROWTH 2 DAYS Performed at [facility]    Report Status PENDING  Incomplete  Wound culture     Status: None   Collection Time: 11/04/14  5:12 AM  Result Value Ref Range Status   Specimen Description WOUND KNEE  Final   Special Requests NONE  Final   Gram Stain   Final    NO WBC SEEN FEW SQUAMOUS EPITHELIAL CELLS PRESENT NO ORGANISMS SEEN Performed at [facility]    Culture   Final    MODERATE STAPHYLOCOCCUS AUREUS Note: RIFAMPIN AND GENTAMICIN SHOULD NOT BE USED AS SINGLE DRUGS FOR TREATMENT OF STAPH INFECTIONS. Performed at [facility]    Report Status 11/06/2014 FINAL  Final   Organism ID, Bacteria STAPHYLOCOCCUS AUREUS  Final      Susceptibility   Staphylococcus aureus - MIC*    CLINDAMYCIN <=0.25 SENSITIVE Sensitive     ERYTHROMYCIN <=0.25 SENSITIVE Sensitive     GENTAMICIN <=0.5 SENSITIVE Sensitive     LEVOFLOXACIN 0.25 SENSITIVE Sensitive     OXACILLIN 0.5 SENSITIVE Sensitive     RIFAMPIN <=0.5 SENSITIVE Sensitive     TRIMETH/SULFA <=10 SENSITIVE Sensitive      VANCOMYCIN 1 SENSITIVE Sensitive     TETRACYCLINE <=1 SENSITIVE Sensitive     MOXIFLOXACIN <=0.25 SENSITIVE Sensitive     * MODERATE STAPHYLOCOCCUS AUREUS  Blood culture (routine x 2)     Status: None (Preliminary result)   Collection Time: 11/04/14  5:21 AM  Result Value Ref Range Status   Specimen Description BLOOD LEFT HAND  Final  Special Requests BOTTLES DRAWN AEROBIC AND ANAEROBIC  Final   Culture   Final    NO GROWTH 2 DAYS Performed at [facility]    Report Status PENDING  Incomplete  Anaerobic culture     Status: None (Preliminary result)   Collection Time: 11/04/14 12:50 PM  Result Value Ref Range Status   Specimen Description FLUID RIGHT KNEE  Final   Special Requests Normal  Final   Gram Stain   Final    ABUNDANT WBC PRESENT,BOTH PMN AND MONONUCLEAR FEW GRAM POSITIVE COCCI IN PAIRS    Culture   Final    NO ANAEROBES ISOLATED; CULTURE IN PROGRESS FOR 5 DAYS Performed at [facility]    Report Status PENDING  Incomplete  Fungus culture w smear     Status: None (Preliminary result)   Collection Time: 11/04/14 12:50 PM  Result Value Ref Range Status   Specimen Description KNEE JOINT FLUID RIGHT  Final   Special Requests Normal  Final   Fungal Smear   Final    NO YEAST OR FUNGAL ELEMENTS SEEN Performed at [facility]    Culture   Final    CULTURE IN PROGRESS FOR FOUR WEEKS Performed at [facility]    Report Status PENDING  Incomplete  Body fluid culture     Status: None (Preliminary result)   Collection Time: 11/04/14 12:50 PM  Result Value Ref Range Status   Specimen Description FLUID RIGHT KNEE  Final   Special Requests NONE  Final   Gram Stain   Final    ABUNDANT WBC PRESENT,BOTH PMN AND MONONUCLEAR FEW GRAM POSITIVE COCCI IN PAIRS    Culture   Final    CULTURE REINCUBATED FOR BETTER GROWTH Performed at [facility]    Report Status PENDING  Incomplete  Urine culture     Status: None (Preliminary result)    Collection Time: 11/05/14 10:55 PM  Result Value Ref Range Status   Specimen Description URINE, RANDOM  Final   Special Requests NONE  Final   Culture TOO YOUNG TO READ  Final   Report Status PENDING  Incomplete  Surgical pcr screen     Status: None   Collection Time: 11/05/14 10:57 PM  Result Value Ref Range Status   MRSA, PCR NEGATIVE NEGATIVE Final   Staphylococcus aureus NEGATIVE NEGATIVE Final    Comment:        The Xpert SA Assay (FDA approved for NASAL specimens in patients over 68 years of age), is one component of a comprehensive surveillance program.  Test performance has been validated by [facility] for patients greater than or equal to 20 year old. It is not intended to diagnose infection nor to guide or monitor treatment.      Studies: No results found.  Scheduled Meds:  Scheduled Meds: . [MAR Hold] atorvastatin  40 mg Oral q morning - 10a  . bupivacaine liposome  20 mL Infiltration Once  . [MAR Hold] docusate sodium  100 mg Oral BID  . [MAR Hold] enoxaparin (LOVENOX) injection  50 mg Subcutaneous Q24H  . [MAR Hold] nebivolol  10 mg Oral q morning - 10a  . [MAR Hold] sodium chloride  3 mL Intravenous Q12H  . [MAR Hold] vancomycin  1,250 mg Intravenous Q12H  . vancomycin  1,250 mg Intravenous To OR   Continuous Infusions: . sodium chloride 75 mL/hr at 11/04/14 0854  . lactated ringers 100 mL/hr (11/05/14 2119)  . lactated ringers  Time spent on care of this patient: 35 min   RIZWAN,SAIMA, MD 11/06/2014, 4:16 PM  LOS: 2 days   [facility] Office  [phone] Pager - Text Page per [email or web address] If 7PM-7AM, please contact night-coverage [email or web address]

## 2014-11-06 NOTE — Progress Notes (Signed)
Called report to Short stay. Pt going down to have surgery

## 2014-11-06 NOTE — Discharge Instructions (Signed)
INSTRUCTIONS AFTER JOINT REPLACEMENT   o Remove items at home which could result in a fall. This includes throw rugs or furniture in walking pathways o ICE to the affected joint every three hours while awake for 30 minutes at a time, for at least the first 3-5 days, and then as needed for pain and swelling.  Continue to use ice for pain and swelling. You may notice swelling that will progress down to the foot and ankle.  This is normal after surgery.  Elevate your leg when you are not up walking on it.   o Continue to use the breathing machine you got in the hospital (incentive spirometer) which will help keep your temperature down.  It is common for your temperature to cycle up and down following surgery, especially at night when you are not up moving around and exerting yourself.  The breathing machine keeps your lungs expanded and your temperature down.  BLOOD THINNER: USE LOVENOX AS DIRECTED FOR A TOTAL OF 14 DAYS FOLLOWING SURGERY.  ONCE FINISHED WITH THIS, TAKE ASPIRIN  ONE TAB ONCE DAILY.  THESE MEDICATIONS ARE BOTH USED TO PREVENT BLOOD CLOTS.  DIET:  As you were doing prior to hospitalization, we recommend a well-balanced diet.  DRESSING / WOUND CARE / SHOWERING  You may change your dressing 3-5 days after surgery.  Then change the dressing every day with sterile gauze.  Please use good hand washing techniques before changing the dressing.  Do not use any lotions or creams on the incision until instructed by your surgeon. and You may shower 3 days after surgery, but keep the wounds dry during showering.  You may use an occlusive plastic wrap (Press'n Seal for example), NO SOAKING/SUBMERGING IN THE BATHTUB.  If the bandage gets wet, change with a clean dry gauze.  If the incision gets wet, pat the wound dry with a clean towel.     WEIGHT BEARING   Weight bearing as tolerated with assist device (walker, cane, etc) as directed, use it as long as suggested by your surgeon or therapist,  typically at least 4-6 weeks.   CONSTIPATION  Constipation is defined medically as fewer than three stools per week and severe constipation as less than one stool per week.  Even if you have a regular bowel pattern at home, your normal regimen is likely to be disrupted due to multiple reasons following surgery.  Combination of anesthesia, postoperative narcotics, change in appetite and fluid intake all can affect your bowels.   YOU MUST use at least one of the following options; they are listed in order of increasing strength to get the job done.  They are all available over the counter, and you may need to use some, POSSIBLY even all of these options:    Drink plenty of fluids (prune juice may be helpful) and high fiber foods Colace 100 mg by mouth twice a day  Senokot for constipation as directed and as needed Dulcolax (bisacodyl), take with full glass of water  Miralax (polyethylene glycol) once or twice a day as needed.  If you have tried all these things and are unable to have a bowel movement in the first 3-4 days after surgery call either your surgeon or your primary doctor.    If you experience loose stools or diarrhea, hold the medications until you stool forms back up.  If your symptoms do not get better within 1 week or if they get worse, check with your doctor.  If you experience "  the worst abdominal pain ever" or develop nausea or vomiting, please contact the office immediately for further recommendations for treatment.   ITCHING:  If you experience itching with your medications, try taking only a single pain pill, or even half a pain pill at a time.  You can also use Benadryl over the counter for itching or also to help with sleep.   TED HOSE STOCKINGS:  Use stockings on both legs until for at least 2 weeks or as directed by physician office. They may be removed at night for sleeping.  MEDICATIONS:  See your medication summary on the After Visit Summary that nursing will review  with you.  You may have some home medications which will be placed on hold until you complete the course of blood thinner medication.  It is important for you to complete the blood thinner medication as prescribed.  PRECAUTIONS:  If you experience chest pain or shortness of breath - call 911 immediately for transfer to the hospital emergency department.   If you develop a fever greater that 101 F, purulent drainage from wound, increased redness or drainage from wound, foul odor from the wound/dressing, or calf pain - CONTACT YOUR SURGEON.                                                   FOLLOW-UP APPOINTMENTS:  If you do not already have a post-op appointment, please call the office for an appointment to be seen by your surgeon.  Guidelines for how soon to be seen are listed in your After Visit Summary, but are typically between 1-4 weeks after surgery.  OTHER INSTRUCTIONS:   Knee Replacement:  Do not place pillow under knee, focus on keeping the knee straight while resting. CPM instructions: 0-90 degrees, 2 hours in the morning, 2 hours in the afternoon, and 2 hours in the evening. Place foam block, curve side up under heel at all times except when in CPM or when walking.  DO NOT modify, tear, cut, or change the foam block in any way.  MAKE SURE YOU:   Understand these instructions.   Get help right away if you are not doing well or get worse.    Thank you for letting us be a part of your medical care team.  It is a privilege we respect greatly.  We hope these instructions will help you stay on track for a fast and full recovery!

## 2014-11-06 NOTE — H&P (View-Only) (Signed)
Patient ID: [name] [name], female   DOB: 02/26/1950, 64 y.o.   MRN: [id number]   Patient to be transferred to [facility] today for right knee surgery tomorrow Please hold Lovenox today Make NPO after midnight  Please call with questions -Lindsey Stanbery, PA-C [phone] 

## 2014-11-06 NOTE — Interval H&P Note (Signed)
History and Physical Interval Note:  11/06/2014 8:30 AM  [name] [name]  has presented today for surgery, with the diagnosis of RIGHT KNEE INFECTION   The various methods of treatment have been discussed with the patient and family. After consideration of risks, benefits and other options for treatment, the patient has consented to  Procedure(s): REVISION RIGHT TOTAL KNEE,  IRRIGATION AND DEBRIDEMENT   (Right) PLACEMENT OF CEMENTED SPACER   (Right) as a surgical intervention .  The patient's history has been reviewed, patient examined, no change in status, stable for surgery.  I have reviewed the patient's chart and labs.  Questions were answered to the patient's satisfaction.     Loreta Ave

## 2014-11-06 NOTE — Anesthesia Procedure Notes (Addendum)
Anesthesia Regional Block:  Femoral nerve block  Pre-Anesthetic Checklist: ,, timeout performed, Correct Patient, Correct Site, Correct Laterality, Correct Procedure, Correct Position, site marked, Risks and benefits discussed, pre-op evaluation,  At surgeon's request and post-op pain management  Laterality: Right  Prep: Maximum Sterile Barrier Precautions used and chloraprep       Needles:  Injection technique: Single-shot  Needle Type: Echogenic Stimulator Needle     Needle Length: 5cm 5 cm Needle Gauge: 22 and 22 G    Additional Needles:  Procedures: ultrasound guided (picture in chart) Femoral nerve block  Nerve Stimulator or Paresthesia:  Response: Patellar respose,   Additional Responses:   Narrative:  Start time: 11/06/2014 1:30 PM End time: 11/06/2014 1:40 PM Injection made incrementally with aspirations every 5 mL. Anesthesiologist: Gaynelle Adu  Additional Notes: 2% Lidocaine skin wheel.    Procedure Name: Intubation Date/Time: 11/06/2014 2:34 PM Performed by: Fransisca Kaufmann Pre-anesthesia Checklist: Patient identified, Emergency Drugs available, Suction available, Patient being monitored and Timeout performed Patient Re-evaluated:Patient Re-evaluated prior to inductionOxygen Delivery Method: Circle system utilized Preoxygenation: Pre-oxygenation with 100% oxygen Intubation Type: IV induction Ventilation: Mask ventilation without difficulty Laryngoscope Size: Miller and 2 Grade View: Grade I Tube type: Oral Tube size: 7.5 mm Number of attempts: 1 Airway Equipment and Method: Stylet Placement Confirmation: ETT inserted through vocal cords under direct vision,  positive ETCO2 and CO2 detector Secured at: 22 cm Tube secured with: Tape Dental Injury: Teeth and Oropharynx as per pre-operative assessment

## 2014-11-06 NOTE — Anesthesia Preprocedure Evaluation (Signed)
Anesthesia Evaluation  Patient identified by MRN, date of birth, ID band Patient awake    Reviewed: Allergy & Precautions, H&P , NPO status , Patient's Chart, lab work & pertinent test results, reviewed documented beta blocker date and time   Airway Mallampati: II  TM Distance: >3 FB Neck ROM: Full    Dental no notable dental hx. (+) Teeth Intact, Dental Advisory Given   Pulmonary neg pulmonary ROS, former smoker,    Pulmonary exam normal breath sounds clear to auscultation       Cardiovascular hypertension, Pt. on medications and Pt. on home beta blockers  Rhythm:Regular Rate:Normal     Neuro/Psych negative neurological ROS  negative psych ROS   GI/Hepatic negative GI ROS, Neg liver ROS,   Endo/Other  Morbid obesity  Renal/GU negative Renal ROS  negative genitourinary   Musculoskeletal  (+) Arthritis , Osteoarthritis,    Abdominal   Peds  Hematology negative hematology ROS (+)   Anesthesia Other Findings   Reproductive/Obstetrics negative OB ROS                             Anesthesia Physical Anesthesia Plan  ASA: III  Anesthesia Plan: General and Regional   Post-op Pain Management: GA combined w/ Regional for post-op pain   Induction: Intravenous  Airway Management Planned: LMA and Oral ETT  Additional Equipment:   Intra-op Plan:   Post-operative Plan: Extubation in OR  Informed Consent: I have reviewed the patients History and Physical, chart, labs and discussed the procedure including the risks, benefits and alternatives for the proposed anesthesia with the patient or authorized representative who has indicated his/her understanding and acceptance.   Dental advisory given  Plan Discussed with: CRNA  Anesthesia Plan Comments:         Anesthesia Quick Evaluation

## 2014-11-07 ENCOUNTER — Encounter (HOSPITAL_COMMUNITY): Payer: Self-pay | Admitting: Orthopedic Surgery

## 2014-11-07 MED ORDER — CEFAZOLIN SODIUM-DEXTROSE 2-3 GM-% IV SOLR
2.0000 g | Freq: Three times a day (TID) | INTRAVENOUS | Status: DC
  Administered 2014-11-07 – 2014-11-09 (×6): 2 g via INTRAVENOUS
  Filled 2014-11-07 (×10): qty 50

## 2014-11-07 MED ORDER — SODIUM CHLORIDE 0.9 % IJ SOLN
10.0000 mL | INTRAMUSCULAR | Status: DC | PRN
  Administered 2014-11-09: 10 mL
  Filled 2014-11-07: qty 40

## 2014-11-07 MED ORDER — OXYCODONE-ACETAMINOPHEN 5-325 MG PO TABS
1.0000 | ORAL_TABLET | Freq: Four times a day (QID) | ORAL | Status: DC | PRN
  Administered 2014-11-07: 1 via ORAL
  Administered 2014-11-08 – 2014-11-09 (×6): 2 via ORAL
  Filled 2014-11-07 (×5): qty 2
  Filled 2014-11-07: qty 1
  Filled 2014-11-07: qty 2

## 2014-11-07 MED ORDER — MORPHINE SULFATE ER 15 MG PO TBCR
15.0000 mg | EXTENDED_RELEASE_TABLET | Freq: Two times a day (BID) | ORAL | Status: DC
  Administered 2014-11-07 – 2014-11-09 (×5): 15 mg via ORAL
  Filled 2014-11-07 (×5): qty 1

## 2014-11-07 MED ORDER — RIFAMPIN 300 MG PO CAPS
300.0000 mg | ORAL_CAPSULE | Freq: Every day | ORAL | Status: DC
  Administered 2014-11-07 – 2014-11-09 (×3): 300 mg via ORAL
  Filled 2014-11-07 (×3): qty 1

## 2014-11-07 NOTE — Care Management Important Message (Signed)
Important Message  Patient Details  Name: [name] [name] MRN: [id number] Date of Birth: [birth year]   Medicare Important Message Given:  Yes-second notification given    Orson Aloe 11/07/2014, 9:25 AM

## 2014-11-07 NOTE — Progress Notes (Signed)
[facility] Progress Note   Melyssa Signor  ZOX:[id number]  DOB: 12-Dec-1950  DOA: 11/04/2014 PCP: Lupita Raider, MD  Brief narrative: [name] [name] is a 64 y.o. female with history of bilateral knee replacements, septic arthritis of the right knee, hypertension, hyperlipidemia who presents with 4 days of right knee pain swelling warmth and drainage. In the past septic arthritis was noted to be secondary to MSSA- she was bacteremic as well and was treated with 6 weeks of Ancef followed by 6 months of Keflex.   Subjective: Continues to have some right knee pain which is quite severe. No nausea, vomiting, diarrhea or abdominal pain.   Assessment/Plan: Principal Problem:   Infection of prosthetic knee joint -Has history of MSSA infection of same knee as mentioned above -s/p irrigation and debridement and placement of a spacer on 9/21 -Continue antibiotics per ID-wound culture growing MSSA -PICC line to be placed - increase medications for pain control- start MS Contin with Oxycodone to continue for breakthrough pain  Active Problems:   Hyperlipidemia -Continue statin    Hypertension -Continue nebivolol    Code Status:     Code Status Orders        Start     Ordered   11/04/14 0732  Full code   Continuous     11/04/14 0732     Family Communication:  Disposition Plan: PT eval   DVT prophylaxis: Lovenox Consultants: Orthopedic surgery, infectious disease Procedures:  Antibiotics: Anti-infectives    Start     Dose/Rate Route Frequency Ordered Stop   11/07/14 0100  vancomycin (VANCOCIN) 1,250 mg in sodium chloride 0.9 % 250 mL IVPB     1,250 mg 166.7 mL/hr over 90 Minutes Intravenous Every 12 hours 11/05/14 1745     11/06/14 1500  vancomycin (VANCOCIN) 1,250 mg in sodium chloride 0.9 % 250 mL IVPB  Status:  Discontinued     1,250 mg 166.7 mL/hr over 90 Minutes Intravenous To Surgery 11/06/14 1451 11/06/14 1911   11/04/14 2200  vancomycin (VANCOCIN) 1,250  mg in sodium chloride 0.9 % 250 mL IVPB     1,250 mg 166.7 mL/hr over 90 Minutes Intravenous Every 12 hours 11/04/14 0846 11/06/14 0147   11/04/14 0745  vancomycin (VANCOCIN) 2,000 mg in sodium chloride 0.9 % 500 mL IVPB     2,000 mg 250 mL/hr over 120 Minutes Intravenous  Once 11/04/14 0738 11/04/14 1056   11/04/14 0645  ceFAZolin (ANCEF) IVPB 2 g/50 mL premix     2 g 100 mL/hr over 30 Minutes Intravenous  Once 11/04/14 0634 11/04/14 0745      Objective: Filed Weights   11/04/14 0825  Weight: 114.08 kg (251 lb 8 oz)    Intake/Output Summary (Last 24 hours) at 11/07/14 1209 Last data filed at 11/06/14 1645  Gross per 24 hour  Intake   2000 ml  Output    450 ml  Net   1550 ml     Vitals Filed Vitals:   11/06/14 1845 11/06/14 1855 11/06/14 2100 11/07/14 0623  BP:  177/68 158/91 164/42  Pulse: 95 102 116 98  Temp:   98.1 F (36.7 C) 98.4 F (36.9 C)  TempSrc:   Oral Oral  Resp: Height:      Weight:      SpO2: 100% 100% 100% 100%    Exam:  General:  Pt is alert, not in acute distress  HEENT: No icterus, No thrush, oral mucosa moist  Cardiovascular: regular rate and  rhythm, S1/S2 No murmur  Respiratory: clear to auscultation bilaterally   Abdomen: Soft, +Bowel sounds, non tender, non distended, no guarding  MSK: No LE cyanosis or clubbing-right knee in dressing wound vac in place  Data Reviewed: Basic Metabolic Panel:  Recent Labs Lab 11/04/14 0507 11/05/14 1357 11/05/14 2245  NA 139 140 140  K 4.6 3.8 4.0  CL 107 111 110  CO2 GLUCOSE 99 103* 107*  BUN 7 <5* 8  CREATININE 0.69 0.49 0.85  CALCIUM 9.7 9.2 9.2   Liver Function Tests:  Recent Labs Lab 11/04/14 0507 11/05/14 1357 11/05/14 2245  AST 16 13* 17  ALT 7* 8* 8*  ALKPHOS 101 88 95  BILITOT 0.6 0.7 0.5  PROT 8.7* 7.8 8.3*  ALBUMIN 3.6 3.2* 3.2*   No results for input(s): LIPASE, AMYLASE in the last 168 hours. No results for input(s): AMMONIA in the last 168  hours. CBC:  Recent Labs Lab 11/04/14 0507 11/05/14 1357 11/05/14 2245  WBC 7.7 5.5 6.6  NEUTROABS 4.4  --  4.1  HGB 11.0* 10.7* 11.2*  HCT 36.0 35.8* 37.3  MCV 68.3* 69.4* 70.2*  PLT 407* 360 379   Cardiac Enzymes: No results for input(s): CKTOTAL, CKMB, CKMBINDEX, TROPONINI in the last 168 hours. BNP (last 3 results) No results for input(s): BNP in the last 8760 hours.  ProBNP (last 3 results) No results for input(s): PROBNP in the last 8760 hours.  CBG: No results for input(s): GLUCAP in the last 168 hours.  Recent Results (from the past 240 hour(s))  Blood culture (routine x 2)     Status: None (Preliminary result)   Collection Time: 11/04/14  5:07 AM  Result Value Ref Range Status   Specimen Description BLOOD RIGHT HAND  Final   Special Requests BOTTLES DRAWN AEROBIC ONLY 8.5ML  Final   Culture   Final    NO GROWTH 2 DAYS Performed at [facility]    Report Status PENDING  Incomplete  Wound culture     Status: None   Collection Time: 11/04/14  5:12 AM  Result Value Ref Range Status   Specimen Description WOUND KNEE  Final   Special Requests NONE  Final   Gram Stain   Final    NO WBC SEEN FEW SQUAMOUS EPITHELIAL CELLS PRESENT NO ORGANISMS SEEN Performed at [facility]    Culture   Final    MODERATE STAPHYLOCOCCUS AUREUS Note: RIFAMPIN AND GENTAMICIN SHOULD NOT BE USED AS SINGLE DRUGS FOR TREATMENT OF STAPH INFECTIONS. Performed at [facility]    Report Status 11/06/2014 FINAL  Final   Organism ID, Bacteria STAPHYLOCOCCUS AUREUS  Final      Susceptibility   Staphylococcus aureus - MIC*    CLINDAMYCIN <=0.25 SENSITIVE Sensitive     ERYTHROMYCIN <=0.25 SENSITIVE Sensitive     GENTAMICIN <=0.5 SENSITIVE Sensitive     LEVOFLOXACIN 0.25 SENSITIVE Sensitive     OXACILLIN 0.5 SENSITIVE Sensitive     RIFAMPIN <=0.5 SENSITIVE Sensitive     TRIMETH/SULFA <=10 SENSITIVE Sensitive     VANCOMYCIN 1 SENSITIVE Sensitive     TETRACYCLINE  <=1 SENSITIVE Sensitive     MOXIFLOXACIN <=0.25 SENSITIVE Sensitive     * MODERATE STAPHYLOCOCCUS AUREUS  Blood culture (routine x 2)     Status: None (Preliminary result)   Collection Time: 11/04/14  5:21 AM  Result Value Ref Range Status   Specimen Description BLOOD LEFT HAND  Final   Special  Requests BOTTLES DRAWN AEROBIC AND ANAEROBIC  Final   Culture   Final    NO GROWTH 2 DAYS Performed at [facility]    Report Status PENDING  Incomplete  Anaerobic culture     Status: None (Preliminary result)   Collection Time: 11/04/14 12:50 PM  Result Value Ref Range Status   Specimen Description FLUID RIGHT KNEE  Final   Special Requests Normal  Final   Gram Stain   Final    ABUNDANT WBC PRESENT,BOTH PMN AND MONONUCLEAR FEW GRAM POSITIVE COCCI IN PAIRS    Culture   Final    NO ANAEROBES ISOLATED; CULTURE IN PROGRESS FOR 5 DAYS Performed at [facility]    Report Status PENDING  Incomplete  Fungus culture w smear     Status: None (Preliminary result)   Collection Time: 11/04/14 12:50 PM  Result Value Ref Range Status   Specimen Description KNEE JOINT FLUID RIGHT  Final   Special Requests Normal  Final   Fungal Smear   Final    NO YEAST OR FUNGAL ELEMENTS SEEN Performed at [facility]    Culture   Final    CULTURE IN PROGRESS FOR FOUR WEEKS Performed at [facility]    Report Status PENDING  Incomplete  Body fluid culture     Status: None (Preliminary result)   Collection Time: 11/04/14 12:50 PM  Result Value Ref Range Status   Specimen Description FLUID RIGHT KNEE  Final   Special Requests NONE  Final   Gram Stain   Final    ABUNDANT WBC PRESENT,BOTH PMN AND MONONUCLEAR FEW GRAM POSITIVE COCCI IN PAIRS    Culture   Final    MODERATE STAPHYLOCOCCUS AUREUS Performed at [facility]    Report Status PENDING  Incomplete  Urine culture     Status: None (Preliminary result)   Collection Time: 11/05/14 10:55 PM  Result Value Ref  Range Status   Specimen Description URINE, RANDOM  Final   Special Requests NONE  Final   Culture TOO YOUNG TO READ  Final   Report Status PENDING  Incomplete  Surgical pcr screen     Status: None   Collection Time: 11/05/14 10:57 PM  Result Value Ref Range Status   MRSA, PCR NEGATIVE NEGATIVE Final   Staphylococcus aureus NEGATIVE NEGATIVE Final    Comment:        The Xpert SA Assay (FDA approved for NASAL specimens in patients over 73 years of age), is one component of a comprehensive surveillance program.  Test performance has been validated by [facility] for patients greater than or equal to 92 year old. It is not intended to diagnose infection nor to guide or monitor treatment.      Studies: No results found.  Scheduled Meds:  Scheduled Meds: . atorvastatin  40 mg Oral q morning - 10a  . bupivacaine liposome  20 mL Infiltration Once  . docusate sodium  100 mg Oral BID  . enoxaparin (LOVENOX) injection  50 mg Subcutaneous Q24H  . morphine  15 mg Oral Q12H  . nebivolol  10 mg Oral q morning - 10a  . sodium chloride  3 mL Intravenous Q12H  . vancomycin  1,250 mg Intravenous Q12H   Continuous Infusions: . lactated ringers      Time spent on care of this patient: 35 min   Dreden Rivere, MD 11/07/2014, 12:09 PM  LOS: 3 days   [facility] Office  [phone] Pager -  Text Page per [email or web address] If 7PM-7AM, please contact night-coverage [email or web address]

## 2014-11-07 NOTE — Anesthesia Postprocedure Evaluation (Signed)
  Anesthesia Post-op Note  Patient: [name] [name]  Procedure(s) Performed: Procedure(s): REMOVAL OF TOTAL COMPONENTS, EXTENSIVE  IRRIGATION AND DEBRIDEMENT  RIGHT KNEE (Right) PLACEMENT OF CEMENT SPACER (Right)  Patient Location: PACU  Anesthesia Type: General, Regional   Level of Consciousness: awake, alert  and oriented  Airway and Oxygen Therapy: Patient Spontanous Breathing  Post-op Pain: mild  Post-op Assessment: Post-op Vital signs reviewed  Post-op Vital Signs: Reviewed  Last Vitals:  Filed Vitals:   11/07/14 2113  BP: 169/60  Pulse: 106  Temp: 36.7 C  Resp: 19    Complications: No apparent anesthesia complications

## 2014-11-07 NOTE — Progress Notes (Signed)
Patient requested fresh pitcher of ice water (delivered by RN). Patient requests all IV pain medications to be pushed slowly because it burns if the medication is pushed too fast. Nursing will continue to monitor.

## 2014-11-07 NOTE — Op Note (Signed)
[name], [name] [name] NO.:  [id number]  MEDICAL RECORD NO.:  [id number]  LOCATION:  5N19C                        FACILITY:  [facility]  PHYSICIAN:  Loreta Ave, M.D. 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  Drainage from inferior incision. This is relatively chronic.  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  Drainage from inferior incision. This is relatively chronic with significant adhesions, arthrofibrosis, and purulence.  No gross loosening of the fixed components however.  PROCEDURE:  Right knee exploration.  Extensive irrigation and debridement of intra and extra-articular purulence.  Elliptical excision of inferior sinus tract.  Removal of all remaining components from femur, tibia, and patella.  Extensive irrigation and debridement. Placement of a cement St[employer]ker Remedy spacer impregnated with antibiotics.  Size medium on the femur side, large on the tibia.  SURGEON:  Loreta Ave, M.D.  ASSISTANT:  Mikey Kirschner, PA, present throughout the entire case and necessary for timely completion of procedure.  ANESTHESIA:  General.  BLOOD LOSS:  300 mL.  BLOOD GIVEN:  None.  SPECIMENS:  None.  CULTURES:  None.  COMPLICATIONS:  None.  DRESSINGS:  Soft compressive knee immobilizer.  DRAINS:  Hemovac x1.  TOURNIQUET TIME:  One hour.  DESCRIPTION OF PROCEDURE:  The patient was brought to the operating room and after adequate anesthesia had been obtained, right knee examined. From the chronic infection and scarring, her motion was 0 to about 40 degrees.  Tourniquet applied.  Prepped and draped in usual sterile fashion.  Exsanguinated with elevation of Esmarch.  Tourniquet inflated to  350 mmHg.  Previous incision was opened.  At the bottom of this, I did an elliptical excision of her   DICTATION ENDS HERE.     Loreta Ave, M.D.     DFM/MEDQ  D:  11/07/2014  T:  11/07/2014  Job:  [id number]

## 2014-11-07 NOTE — Evaluation (Signed)
Physical Therapy Evaluation Patient Details Name: [name] [name] MRN: [id number] DOB: 04/01/1950 Today's Date: 11/07/2014   History of Present Illness  Pt is a 64 y/o F s/p I and D Rt total knee and placement of cemented spacer 2/2 Rt knee infection.  Her PMH includes hyperlipidemia, HTN, overactive bladder.  Clinical Impression  Pt admitted with above diagnosis. Pt currently with functional limitations due to the deficits listed below (see PT Problem List). [name] [name] is limited 2/2 severe hypersensitivity to light touch of Rt LE and therefore was limited to rolling in bed.  She required +3 assist for rolling so pericare could be provided.  She does not have assist needed upon return home as her daughter works during the day and pt will need to go to SNF before returning home.  Pt will benefit from skilled PT to increase their independence and safety with mobility to allow discharge to the venue listed below.      Follow Up Recommendations SNF;Supervision/Assistance - 24 hour    Equipment Recommendations  Other (comment) (TBD by next venue of care)    Recommendations for Other Services       Precautions / Restrictions Precautions Precautions: Fall;Knee Precaution Booklet Issued: No Precaution Comments: Reviewed knee precautions and specific orders for no ROM and limited ambulation Required Braces or Orthoses: Knee Immobilizer - Right Knee Immobilizer - Right: On at all times (May remove when in bed to place ice on knee) Restrictions Weight Bearing Restrictions: Yes RLE Weight Bearing: Weight bearing as tolerated      Mobility  Bed Mobility Overal bed mobility: Needs Assistance;+2 for physical assistance Bed Mobility: Rolling Rolling: +2 for physical assistance;Max assist         General bed mobility comments: Max +2 assist w/ very increased time 2/2 pt's sensitivity w/ any mobility.  Pt assisted by reaching and holding onto bed rail while pericare was provided and sheets  were changed as pt had soiled her bed.  Transfers                 General transfer comment: Attempted to have pt sit EOB; however due to pt's sensitivity, was unable  Ambulation/Gait                Stairs            Wheelchair Mobility    Modified Rankin (Stroke Patients Only)       Balance                                             Pertinent Vitals/Pain Pain Assessment: Faces Faces Pain Scale: Hurts worst Pain Location: Rt LE Pain Descriptors / Indicators: Moaning;Grimacing;Guarding;Sharp;Constant Pain Intervention(s): Limited activity within patient's tolerance;Monitored during session;Repositioned;Utilized relaxation techniques    Home Living Family/patient expects to be discharged to:: Skilled nursing facility Living Arrangements: Children (lives w/ her daughter)               Additional Comments: Daughter works during the day and no other available assist    Prior Function Level of Independence: Needs assistance   Gait / Transfers Assistance Needed: PTA pt would sit in her computer chair and shimy her bottom to scoot around her home.  Last time pt remembers walking was in July, 2016 using RW in house, would not go outside for fear of falling.  ADL's / Homemaking Assistance Needed:  Grandaughter would assist pt w/ washing her back and legs.          Hand Dominance        Extremity/Trunk Assessment   Upper Extremity Assessment: Generalized weakness           Lower Extremity Assessment: RLE deficits/detail RLE Deficits / Details: s/p I and D Rt total knee w/ resultant limited ROM and strength       Communication   Communication: No difficulties  Cognition Arousal/Alertness: Awake/alert Behavior During Therapy: Anxious Overall Cognitive Status: Within Functional Limits for tasks assessed                      General Comments General comments (skin integrity, edema, etc.): Pt w/ severe  hypersensitivity to pain and would scream out before PT placed hand on Rt LE.  Because of this was unable to assist pt to sitting EOB as she was unable to slide Rt LE in bed and would scream out in pain w/ light touch anywhere on Rt LE.  Provided educated on importance of and benefits of getting OOB and the potential consequences of immobility.  When educated pt that we needed to provide pericare pt says, "I can just sit here in my urine, it will be fine".  When educated on potential risk for bed sore she replied with, "I won't get a bed sore from lying in bed for a few days".      Exercises General Exercises - Lower Extremity Ankle Circles/Pumps: AROM;Both;15 reps;Supine      Assessment/Plan    PT Assessment Patient needs continued PT services  PT Diagnosis Difficulty walking;Abnormality of gait;Generalized weakness;Acute pain   PT Problem List Decreased strength;Decreased range of motion;Decreased activity tolerance;Decreased balance;Decreased mobility;Decreased knowledge of use of DME;Decreased safety awareness;Decreased knowledge of precautions;Obesity;Decreased skin integrity;Pain  PT Treatment Interventions DME instruction;Gait training;Functional mobility training;Therapeutic activities;Therapeutic exercise;Balance training;Neuromuscular re-education;Patient/family education;Modalities;Wheelchair mobility training   PT Goals (Current goals can be found in the Care Plan section) Acute Rehab PT Goals Patient Stated Goal: rehab before home PT Goal Formulation: With patient Time For Goal Achievement: 11/21/14 Potential to Achieve Goals: Fair    Frequency Min 5X/week   Barriers to discharge Decreased caregiver support No assist availabe at d/c     Co-evaluation               End of Session   Activity Tolerance: Patient limited by pain Patient left: in bed;with call bell/phone within reach;with nursing/sitter in room Nurse Communication: Mobility status;Precautions;Weight  bearing status;Other (comment);Patient requests pain meds         Time: 1610-9604 PT Time Calculation (min) (ACUTE ONLY): 45 min   Charges:   PT Evaluation $Initial PT Evaluation Tier I: 1 Procedure PT Treatments $Therapeutic Activity: 23-37 mins   PT G Codes:       Michail Jewels PT, DPT [phone] Pager: [phone] 11/07/2014, 5:03 PM

## 2014-11-07 NOTE — Progress Notes (Signed)
Subjective: 1 Day Post-Op Procedure(s) (LRB): REMOVAL OF TOTAL COMPONENTS, EXTENSIVE  IRRIGATION AND DEBRIDEMENT  RIGHT KNEE (Right) PLACEMENT OF CEMENT SPACER (Right) Patient reports pain as severe.  Patient with increased pain since surgery despite minimal movement of knee.  No nausea/vomiting, lightheadedness/dizziness, chest pain/sob.  Tolerating diet but ready to advance.  Positive flatus but no bm.  Patient continues to have urinary incontinence but states this has been going on prior to sx.  Objective: Vital signs in last 24 hours: Temp:  [97.7 F (36.5 C)-98.4 F (36.9 C)] 98.4 F (36.9 C) (09/22 0623) Pulse Rate:  [76-116] 98 (09/22 0623) Resp:  [13-20] 20 (09/22 0623) BP: (150-202)/(42-100) 164/42 mmHg (09/22 0623) SpO2:  [98 %-100 %] 100 % (09/22 0623)  Intake/Output from previous day: 09/21 0701 - 09/22 0700 In: 2000 [I.V.:2000] Out: 450 [Urine:200; Blood:250] Intake/Output this shift:     Recent Labs  11/05/14 1357 11/05/14 2245  HGB 10.7* 11.2*    Recent Labs  11/05/14 1357 11/05/14 2245  WBC 5.5 6.6  RBC 5.16* 5.31*  HCT 35.8* 37.3  PLT 360 379    Recent Labs  11/05/14 1357 11/05/14 2245  NA 140 140  K 3.8 4.0  CL 111 110  CO2 23 22  BUN <5* 8  CREATININE 0.49 0.85  GLUCOSE 103* 107*  CALCIUM 9.2 9.2    Recent Labs  11/05/14 2245  INR 1.12    Neurologically intact Neurovascular intact Sensation intact distally Intact pulses distally Dorsiflexion/Plantar flexion intact Compartment soft  hemovac drain in place No drainage noted through dressing Knee immobilizer in place Negative homans bilaterally  Assessment/Plan: 1 Day Post-Op Procedure(s) (LRB): REMOVAL OF TOTAL COMPONENTS, EXTENSIVE  IRRIGATION AND DEBRIDEMENT  RIGHT KNEE (Right) PLACEMENT OF CEMENT SPACER (Right) Advance diet Up with therapy  WBAT RLE Must be in knee immobilizer at all times.  May open knee immobilizer when in bed to place ice pack. NO ROM or right  knee.  No excessive walking. Will remove hemovac drain tomorrow Will change bandage tomorrow  Dry dressing change prn ABLA-mild and stable Continue plan for picc line placement and IV abx per ID (cx grew out MSSA) Continue plan per medicine  Otilio Saber 11/07/2014, 8:19 AM

## 2014-11-07 NOTE — Progress Notes (Signed)
Peripherally Inserted Central Catheter/Midline Placement  The IV Nurse has discussed with the patient and/or persons authorized to consent for the patient, the purpose of this procedure and the potential benefits and risks involved with this procedure.  The benefits include less needle sticks, lab draws from the catheter and patient may be discharged home with the catheter.  Risks include, but not limited to, infection, bleeding, blood clot (thrombus formation), and puncture of an artery; nerve damage and irregular heat beat.  Alternatives to this procedure were also discussed.  PICC/Midline Placement Documentation  PICC / Midline Single Lumen 11/07/14 PICC Right Cephalic 39 cm 0 cm (Active)  Indication for Insertion or Continuance of Line Home intravenous therapies (PICC only) 11/07/2014  9:43 AM  Exposed Catheter (cm) 0 cm 11/07/2014  9:43 AM  Site Assessment Clean;Dry;Intact 11/07/2014  9:43 AM  Line Status Flushed;Saline locked;Blood return noted 11/07/2014  9:43 AM  Dressing Type Transparent 11/07/2014  9:43 AM  Dressing Status Clean;Intact;Dry;Antimicrobial disc in place 11/07/2014  9:43 AM  Line Care Connections checked and tightened 11/07/2014  9:43 AM  Line Adjustment (NICU/IV Team Only) No 11/07/2014  9:43 AM  Dressing Intervention New dressing 11/07/2014  9:43 AM  Dressing Change Due 11/14/14 11/07/2014  9:43 AM       Elliot Dally 11/07/2014, 9:44 AM

## 2014-11-07 NOTE — Progress Notes (Signed)
INFECTIOUS DISEASE PROGRESS NOTE  ID: [name] [name] is a 64 y.o. female with  Principal Problem:   Infection of prosthetic knee joint Active Problems:   Hyperlipidemia   Hypertension  Subjective: Without complaints  Abtx:  Anti-infectives    Start     Dose/Rate Route Frequency Ordered Stop   11/07/14 0100  vancomycin (VANCOCIN) 1,250 mg in sodium chloride 0.9 % 250 mL IVPB     1,250 mg 166.7 mL/hr over 90 Minutes Intravenous Every 12 hours 11/05/14 1745     11/06/14 1500  vancomycin (VANCOCIN) 1,250 mg in sodium chloride 0.9 % 250 mL IVPB  Status:  Discontinued     1,250 mg 166.7 mL/hr over 90 Minutes Intravenous To Surgery 11/06/14 1451 11/06/14 1911   11/04/14 2200  vancomycin (VANCOCIN) 1,250 mg in sodium chloride 0.9 % 250 mL IVPB     1,250 mg 166.7 mL/hr over 90 Minutes Intravenous Every 12 hours 11/04/14 0846 11/06/14 0147   11/04/14 0745  vancomycin (VANCOCIN) 2,000 mg in sodium chloride 0.9 % 500 mL IVPB     2,000 mg 250 mL/hr over 120 Minutes Intravenous  Once 11/04/14 0738 11/04/14 1056   11/04/14 0645  ceFAZolin (ANCEF) IVPB 2 g/50 mL premix     2 g 100 mL/hr over 30 Minutes Intravenous  Once 11/04/14 0634 11/04/14 0745      Medications:  Scheduled: . atorvastatin  40 mg Oral q morning - 10a  . bupivacaine liposome  20 mL Infiltration Once  . docusate sodium  100 mg Oral BID  . enoxaparin (LOVENOX) injection  50 mg Subcutaneous Q24H  . morphine  15 mg Oral Q12H  . nebivolol  10 mg Oral q morning - 10a  . sodium chloride  3 mL Intravenous Q12H  . vancomycin  1,250 mg Intravenous Q12H    Objective: Vital signs in last 24 hours: Temp:  [97.7 F (36.5 C)-98.4 F (36.9 C)] 98.4 F (36.9 C) (09/22 2440) Pulse Rate:  [76-116] 98 (09/22 0623) Resp:  [13-20] 20 (09/22 0623) BP: (150-202)/(42-100) 164/42 mmHg (09/22 0623) SpO2:  [98 %-100 %] 100 % (09/22 0623)   General appearance: alert, cooperative and no distress Incision/Wound: RLE dressed  Lab  Results  Recent Labs  11/05/14 1357 11/05/14 2245  WBC 5.5 6.6  HGB 10.7* 11.2*  HCT 35.8* 37.3  NA 140 140  K 3.8 4.0  CL 111 110  CO2 23 22  BUN <5* 8  CREATININE 0.49 0.85   Liver Panel  Recent Labs  11/05/14 1357 11/05/14 2245  PROT 7.8 8.3*  ALBUMIN 3.2* 3.2*  AST 13* 17  ALT 8* 8*  ALKPHOS 88 95  BILITOT 0.7 0.5   Sedimentation Rate No results for input(s): ESRSEDRATE in the last 72 hours. C-Reactive Protein No results for input(s): CRP in the last 72 hours.  Microbiology: Recent Results (from the past 240 hour(s))  Blood culture (routine x 2)     Status: None (Preliminary result)   Collection Time: 11/04/14  5:07 AM  Result Value Ref Range Status   Specimen Description BLOOD RIGHT HAND  Final   Special Requests BOTTLES DRAWN AEROBIC ONLY 8.5ML  Final   Culture   Final    NO GROWTH 3 DAYS Performed at [facility]    Report Status PENDING  Incomplete  Wound culture     Status: None   Collection Time: 11/04/14  5:12 AM  Result Value Ref Range Status   Specimen Description WOUND KNEE  Final   Special Requests NONE  Final   Gram Stain   Final    NO WBC SEEN FEW SQUAMOUS EPITHELIAL CELLS PRESENT NO ORGANISMS SEEN Performed at [facility]    Culture   Final    MODERATE STAPHYLOCOCCUS AUREUS Note: RIFAMPIN AND GENTAMICIN SHOULD NOT BE USED AS SINGLE DRUGS FOR TREATMENT OF STAPH INFECTIONS. Performed at [facility]    Report Status 11/06/2014 FINAL  Final   Organism ID, Bacteria STAPHYLOCOCCUS AUREUS  Final      Susceptibility   Staphylococcus aureus - MIC*    CLINDAMYCIN <=0.25 SENSITIVE Sensitive     ERYTHROMYCIN <=0.25 SENSITIVE Sensitive     GENTAMICIN <=0.5 SENSITIVE Sensitive     LEVOFLOXACIN 0.25 SENSITIVE Sensitive     OXACILLIN 0.5 SENSITIVE Sensitive     RIFAMPIN <=0.5 SENSITIVE Sensitive     TRIMETH/SULFA <=10 SENSITIVE Sensitive     VANCOMYCIN 1 SENSITIVE Sensitive     TETRACYCLINE <=1 SENSITIVE Sensitive       MOXIFLOXACIN <=0.25 SENSITIVE Sensitive     * MODERATE STAPHYLOCOCCUS AUREUS  Blood culture (routine x 2)     Status: None (Preliminary result)   Collection Time: 11/04/14  5:21 AM  Result Value Ref Range Status   Specimen Description BLOOD LEFT HAND  Final   Special Requests BOTTLES DRAWN AEROBIC AND ANAEROBIC 5ML  Final   Culture   Final    NO GROWTH 3 DAYS Performed at [facility]    Report Status PENDING  Incomplete  Anaerobic culture     Status: None (Preliminary result)   Collection Time: 11/04/14 12:50 PM  Result Value Ref Range Status   Specimen Description FLUID RIGHT KNEE  Final   Special Requests Normal  Final   Gram Stain   Final    ABUNDANT WBC PRESENT,BOTH PMN AND MONONUCLEAR FEW GRAM POSITIVE COCCI IN PAIRS    Culture   Final    NO ANAEROBES ISOLATED; CULTURE IN PROGRESS FOR 5 DAYS Performed at [facility]    Report Status PENDING  Incomplete  Fungus culture w smear     Status: None (Preliminary result)   Collection Time: 11/04/14 12:50 PM  Result Value Ref Range Status   Specimen Description KNEE JOINT FLUID RIGHT  Final   Special Requests Normal  Final   Fungal Smear   Final    NO YEAST OR FUNGAL ELEMENTS SEEN Performed at [facility]    Culture   Final    CULTURE IN PROGRESS FOR FOUR WEEKS Performed at [facility]    Report Status PENDING  Incomplete  Body fluid culture     Status: None (Preliminary result)   Collection Time: 11/04/14 12:50 PM  Result Value Ref Range Status   Specimen Description FLUID RIGHT KNEE  Final   Special Requests NONE  Final   Gram Stain   Final    ABUNDANT WBC PRESENT,BOTH PMN AND MONONUCLEAR FEW GRAM POSITIVE COCCI IN PAIRS    Culture   Final    MODERATE STAPHYLOCOCCUS AUREUS Performed at [facility]    Report Status PENDING  Incomplete  Urine culture     Status: None (Preliminary result)   Collection Time: 11/05/14 10:55 PM  Result Value Ref Range Status   Specimen  Description URINE, RANDOM  Final   Special Requests NONE  Final   Culture CULTURE REINCUBATED FOR BETTER GROWTH  Final   Report Status PENDING  Incomplete  Surgical pcr screen  Status: None   Collection Time: 11/05/14 10:57 PM  Result Value Ref Range Status   MRSA, PCR NEGATIVE NEGATIVE Final   Staphylococcus aureus NEGATIVE NEGATIVE Final    Comment:        The Xpert SA Assay (FDA approved for NASAL specimens in patients over 45 years of age), is one component of a comprehensive surveillance program.  Test performance has been validated by [facility] for patients greater than or equal to 2 year old. It is not intended to diagnose infection nor to guide or monitor treatment.     Studies/Results: No results found.   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  She has PIC.  She can f/u in ID clinic in 4-6 weeks.          Bobby Rumpf Infectious Diseases (pager) [phone] [email or web address] 11/07/2014, 3:19 PM  LOS: 3 days   Diagnosis: Prosthetic joint infection and Osteomyelltis  Culture Result: MSSA  No Known Allergies  Discharge antibiotics: Ancef 2 g ivpb q8h Rifampin 320m po once daily Duration: 39 days  End Date: 12-17-14  [facility] Protocol: Labs weekly while on IV antibiotics: _X_ CBC with differential _X_ CMP _X_ CRP _X_ ESR  Fax weekly labs to ([phone] Clinic Follow Up Appt: HJohnnye Simaor [place] or CMegan Salon4-6 weeks

## 2014-11-07 NOTE — Progress Notes (Signed)
Patient urinated on herself. NT asked RN to assist with a full linen change. Patient refused RN to assist with turning her while the NT began the linen change stating that she was in too much pain. RN administered Dilaudid IV. RN and NT were able to successfully complete full linen change after pain medication administration. Nursing will continue to monitor.

## 2014-11-08 DIAGNOSIS — T8450XD Infection and inflammatory reaction due to unspecified internal joint prosthesis, subsequent encounter: Secondary | ICD-10-CM

## 2014-11-08 DIAGNOSIS — M25561 Pain in right knee: Secondary | ICD-10-CM

## 2014-11-08 DIAGNOSIS — M25562 Pain in left knee: Secondary | ICD-10-CM

## 2014-11-08 DIAGNOSIS — M869 Osteomyelitis, unspecified: Secondary | ICD-10-CM

## 2014-11-08 LAB — CBC
HCT: 25.7 % — ABNORMAL LOW (ref 36.0–46.0)
HEMATOCRIT: 26.1 % — AB (ref 36.0–46.0)
HEMOGLOBIN: 7.7 g/dL — AB (ref 12.0–15.0)
HEMOGLOBIN: 8 g/dL — AB (ref 12.0–15.0)
MCH: 21 pg — AB (ref 26.0–34.0)
MCH: 21.4 pg — ABNORMAL LOW (ref 26.0–34.0)
MCHC: 30 g/dL (ref 30.0–36.0)
MCHC: 30.7 g/dL (ref 30.0–36.0)
MCV: 70 fL — ABNORMAL LOW (ref 78.0–100.0)
MCV: 70 fL — AB (ref 78.0–100.0)
Platelets: 277 10*3/uL (ref 150–400)
Platelets: 345 10*3/uL (ref 150–400)
RBC: 3.67 MIL/uL — AB (ref 3.87–5.11)
RBC: 3.73 MIL/uL — ABNORMAL LOW (ref 3.87–5.11)
RDW: 18.9 % — ABNORMAL HIGH (ref 11.5–15.5)
RDW: 19.3 % — AB (ref 11.5–15.5)
WBC: 7.9 10*3/uL (ref 4.0–10.5)
WBC: 8.8 10*3/uL (ref 4.0–10.5)

## 2014-11-08 LAB — URINE CULTURE

## 2014-11-08 LAB — BASIC METABOLIC PANEL
ANION GAP: 5 (ref 5–15)
BUN: 5 mg/dL — ABNORMAL LOW (ref 6–20)
CALCIUM: 8.9 mg/dL (ref 8.9–10.3)
CHLORIDE: 109 mmol/L (ref 101–111)
CO2: 25 mmol/L (ref 22–32)
Creatinine, Ser: 0.78 mg/dL (ref 0.44–1.00)
GFR calc non Af Amer: >60 mL/min (ref >60)
Glucose, Bld: 148 mg/dL — ABNORMAL HIGH (ref 65–99)
Potassium: 3.8 mmol/L (ref 3.5–5.1)
Sodium: 139 mmol/L (ref 135–145)

## 2014-11-08 LAB — BODY FLUID CULTURE

## 2014-11-08 LAB — PREPARE RBC (CROSSMATCH)

## 2014-11-08 MED ORDER — DOCUSATE SODIUM 100 MG PO CAPS: 100.0000 mg | ORAL_CAPSULE | Freq: Two times a day (BID) | ORAL | Status: DC

## 2014-11-08 MED ORDER — FERROUS SULFATE 325 (65 FE) MG PO TABS: 325.0000 mg | ORAL_TABLET | Freq: Two times a day (BID) | ORAL | Status: DC

## 2014-11-08 MED ORDER — SODIUM CHLORIDE 0.9 % IV SOLN: Freq: Once | INTRAVENOUS | Status: DC

## 2014-11-08 MED ORDER — ACETAMINOPHEN 325 MG PO TABS: 650.0000 mg | ORAL_TABLET | ORAL | Status: DC | PRN

## 2014-11-08 MED ORDER — MORPHINE SULFATE ER 15 MG PO TBCR: 15.0000 mg | EXTENDED_RELEASE_TABLET | Freq: Two times a day (BID) | ORAL | Status: DC

## 2014-11-08 MED ORDER — IRBESARTAN 150 MG PO TABS
150.0000 mg | ORAL_TABLET | Freq: Every day | ORAL | Status: DC
  Administered 2014-11-08 – 2014-11-09 (×2): 150 mg via ORAL
  Filled 2014-11-08 (×2): qty 1

## 2014-11-08 MED ORDER — FERROUS SULFATE 325 (65 FE) MG PO TABS
325.0000 mg | ORAL_TABLET | Freq: Two times a day (BID) | ORAL | Status: DC
  Administered 2014-11-08 – 2014-11-09 (×2): 325 mg via ORAL
  Filled 2014-11-08 (×2): qty 1

## 2014-11-08 MED ORDER — RIFAMPIN 300 MG PO CAPS: 300.0000 mg | ORAL_CAPSULE | Freq: Every day | ORAL | Status: DC

## 2014-11-08 NOTE — Clinical Social Work Note (Signed)
Clinical Social Work Assessment  Patient Details  Name: [name] [name] MRN: [id number] Date of Birth: 04/26/1950  Date of referral:  11/08/14               Reason for consult:  Facility Placement, Discharge Planning                Permission sought to share information with:  Chartered certified accountant granted to share information::  Yes, Verbal Permission Granted  Name::        Agency::  [employer] (prefers [place] or [place])  Relationship::     Contact Information:     Housing/Transportation Living arrangements for the past 2 months:  [place] of Information:  Patient Patient Interpreter Needed:  None Criminal Activity/Legal Involvement Pertinent to Current Situation/Hospitalization:  No - Comment as needed Significant Relationships:  Adult Children Lives with:  Adult Children Do you feel safe going back to the place where you live?  No (High fall risk.) Need for family participation in patient care:  No (Coment) (Patient able to make own decisions.)  Care giving concerns:  Patient expressed no concerns at this time.   Social Worker assessment / plan:  CSW received referral for possible SNF placement at time of discharge. CSW met with patient at bedside to discuss discharge disposition. Patient understanding and agreeable to PT recommendation for SNF placement at time of discharge. Per patient, patient would prefer [facility] at either [facility] or [facility]. CSW to continue to follow and assist with discharge planning needs.  Employment status:  Retired Forensic scientist:  Medicare PT Recommendations:  [place] / Referral to community resources:  [place]  Patient/Family's Response to care:  Patient understanding and agreeable to CSW plan of care.  Patient/Family's Understanding of and Emotional Response to Diagnosis, Current Treatment, and Prognosis:  Patient understanding and agreeable to  CSW plan of care.  Emotional Assessment Appearance:  Appears stated age Attitude/Demeanor/Rapport:  Other (Pleasant) Affect (typically observed):  Accepting, Appropriate, Pleasant, Happy Orientation:  Oriented to Self, Oriented to Place, Oriented to  Time, Oriented to Situation Alcohol / Substance use:  Not Applicable Psych involvement (Current and /or in the community):  No (Comment) (Not appropriate on this admission.)  Discharge Needs  Concerns to be addressed:  No discharge needs identified Readmission within the last 30 days:  No Current discharge risk:  None Barriers to Discharge:  No Barriers Identified   Caroline Sauger, LCSW 11/08/2014, 12:12 PM [phone]

## 2014-11-08 NOTE — Progress Notes (Signed)
Subjective: 2 Days Post-Op Procedure(s) (LRB): REMOVAL OF TOTAL COMPONENTS, EXTENSIVE  IRRIGATION AND DEBRIDEMENT  RIGHT KNEE (Right) PLACEMENT OF CEMENT SPACER (Right) Patient reports pain as severe.  Continued severe pain despite increased narcotics.  No nausea/vomiting, lightheadedness/dizziness, chest pain/sob.  Positive flatus but no bm.  Tolerating diet.  Objective: Vital signs in last 24 hours: Temp:  [98 F (36.7 C)-100.1 F (37.8 C)] 100.1 F (37.8 C) (09/23 0700) Pulse Rate:  [100-111] 111 (09/23 0700) Resp:  [19-20] 19 (09/23 0700) BP: (122-178)/(41-62) 178/62 mmHg (09/23 0700) SpO2:  [97 %-100 %] 98 % (09/23 0700)  Intake/Output from previous day: 09/22 0701 - 09/23 0700 In: 1880 [P.O.:1780; IV Piggyback:100] Out: 87 [Emesis/NG output:2; Drains:85] Intake/Output this shift:     Recent Labs  11/05/14 1357 11/05/14 2245 11/08/14 0800  HGB 10.7* 11.2* 7.7*    Recent Labs  11/05/14 2245 11/08/14 0800  WBC 6.6 7.9  RBC 5.31* 3.67*  HCT 37.3 25.7*  PLT 379 277    Recent Labs  11/05/14 1357 11/05/14 2245  NA 140 140  K 3.8 4.0  CL 111 110  CO2 23 22  BUN <5* 8  CREATININE 0.49 0.85  GLUCOSE 103* 107*  CALCIUM 9.2 9.2    Recent Labs  11/05/14 2245  INR 1.12    Neurologically intact Neurovascular intact Sensation intact distally Intact pulses distally Dorsiflexion/Plantar flexion intact Incision: moderate drainage No cellulitis present Compartment soft  Knee immobilizer in place but unwrapped by me to change bandage Moderate drainage noted through bandage.  Bandage changed by me today hemovac drain pulled by me today  Assessment/Plan: 2 Days Post-Op Procedure(s) (LRB): REMOVAL OF TOTAL COMPONENTS, EXTENSIVE  IRRIGATION AND DEBRIDEMENT  RIGHT KNEE (Right) PLACEMENT OF CEMENT SPACER (Right) Advance diet Up with therapy  WBAT RLE Must be in knee immobilizer at all times, but may open to place ice packs. NO ROM of right knee.  No  excessive ambulation. Dry dressing change prn ABLA-mild and stable PICC placed yesterday and patient receiving abx per ID Continue d/c plan per medicine Patient will need to fu with ortho in 1-2 weeks.  Pain meds and dvt ppx rx in chart. Ortho signing off   Otilio Saber 11/08/2014, 8:30 AM

## 2014-11-08 NOTE — Clinical Social Work Placement (Signed)
   CLINICAL SOCIAL WORK PLACEMENT  NOTE  Date:  11/08/2014  Patient Details  Name: [name] [name] MRN: [id number] Date of Birth: Nov 02, 1950  Clinical Social Work is seeking post-discharge placement for this patient at the Skilled  Nursing Facility level of care (*CSW will initial, date and re-position this form in  chart as items are completed):  Yes   Patient/family provided with [facility] Clinical Social Work Department's list of facilities offering this level of care within the geographic area requested by the patient (or if unable, by the patient's family).  Yes   Patient/family informed of their freedom to choose among providers that offer the needed level of care, that participate in Medicare, Medicaid or managed care program needed by the patient, have an available bed and are willing to accept the patient.  Yes   Patient/family informed of [facility]'s ownership interest in [facility] and [facility], as well as of the fact that they are under no obligation to receive care at these facilities.  PASRR submitted to EDS on  (n/a)     PASRR number received on  (n/a)     Existing PASRR number confirmed on 11/08/14     FL2 transmitted to all facilities in geographic area requested by pt/family on 11/08/14     FL2 transmitted to all facilities within larger geographic area on  (n/a)     Patient informed that his/her managed care company has contracts with or will negotiate with certain facilities, including the following:   (yes, [place])         Patient/family informed of bed offers received.  Patient chooses bed at       Physician recommends and patient chooses bed at      Patient to be transferred to   on  .  Patient to be transferred to facility by EMS     Patient family notified on   of transfer.  Name of family member notified:        PHYSICIAN Please sign FL2     Additional Comment:    _______________________________________________ Rod Mae, LCSW 11/08/2014, 12:15 PM

## 2014-11-08 NOTE — Progress Notes (Signed)
Physical Therapy Treatment Patient Details Name: [name] [name] MRN: [id number] DOB: 05-26-1950 Today's Date: 11/08/2014    History of Present Illness Pt is a 64 y/o F s/p I and D Rt total knee and placement of cemented spacer 2/2 Rt knee infection.  Her PMH includes hyperlipidemia, HTN, overactive bladder.    PT Comments    [name] continues to be extremely anxious and guarded w/ any mobility.  Educated pt again on the potential consequences of lying in bed and benefits of sitting up.  She required +2 max assist for bed mobility to achieve sitting EOB w/ very increased time, utilizing relaxation techniques.  Pt will benefit from continued skilled PT services to increase functional independence and safety.   Follow Up Recommendations  SNF;Supervision/Assistance - 24 hour     Equipment Recommendations  Other (comment) (TBD by next venue of care)    Recommendations for Other Services       Precautions / Restrictions Precautions Precautions: Fall;Knee Precaution Comments: Reviewed knee precautions and specific orders for no ROM and limited ambulation Required Braces or Orthoses: Knee Immobilizer - Right Knee Immobilizer - Right: On at all times (May remove when in bed to place ice on knee) Restrictions Weight Bearing Restrictions: Yes RLE Weight Bearing: Weight bearing as tolerated    Mobility  Bed Mobility Overal bed mobility: Needs Assistance;+2 for physical assistance Bed Mobility: Supine to Sit     Supine to sit: Max assist;+2 for physical assistance     General bed mobility comments: Max +2 assist w/ very increased time 2/2 pt's sensitivity w/ any mobility.  Pt educated on use of bed sheet which allowed her to assist her Rt LE to EOB.  Pt would not allow PT to assist her w/ Rt LE mobility until Rt LE was off bed and needed support.  Bed pad was used to rotate pelvis very slowly to sitting EOB.  Pt extremely guarded and anxious.    Transfers                     Ambulation/Gait                 Stairs            Wheelchair Mobility    Modified Rankin (Stroke Patients Only)       Balance                                    Cognition Arousal/Alertness: Awake/alert Behavior During Therapy: Anxious Overall Cognitive Status: Within Functional Limits for tasks assessed                      Exercises General Exercises - Lower Extremity Ankle Circles/Pumps: AROM;Both;15 reps;Supine    General Comments General comments (skin integrity, edema, etc.): Educated pt on the benefits of sitting EOB and potential consequences of lying in bed.  Instructed pt to sit EOB w/ Rt LE elevated for at least 2 hours before returning to supine w/ assist from nurse tech, pt verbalized understanding.      Pertinent Vitals/Pain Pain Assessment: Faces Faces Pain Scale: Hurts worst Pain Location: Rt LE w/ any mobility Pain Descriptors / Indicators: Sharp;Moaning;Grimacing;Guarding Pain Intervention(s): Limited activity within patient's tolerance;Monitored during session;Repositioned;Premedicated before session    Home Living  Prior Function            PT Goals (current goals can now be found in the care plan section) Acute Rehab PT Goals Patient Stated Goal: rehab before home PT Goal Formulation: With patient Time For Goal Achievement: 11/21/14 Potential to Achieve Goals: Fair Progress towards PT goals: Progressing toward goals (very modestly)    Frequency  7X/week    PT Plan Frequency needs to be updated    Co-evaluation             End of Session   Activity Tolerance: Patient limited by pain;Other (comment) (limited by anxiety and muscle guarding) Patient left: in bed;with call bell/phone within reach     Time: 0923-1002 PT Time Calculation (min) (ACUTE ONLY): 39 min  Charges:  $Therapeutic Activity: 38-52 mins                    G Codes:      Michail Jewels PT,  DPT [phone] Pager: [phone] 11/08/2014, 11:24 AM

## 2014-11-08 NOTE — Discharge Summary (Addendum)
Physician Discharge Summary  [name] [name] ZOX:[id number] DOB: 1950-11-19 DOA: 11/04/2014  PCP: Lupita Raider, MD  Admit date: 11/04/2014 Discharge date: 11/09/2014  Time spent: 60 minutes   Discharge Condition: stable  Discharge Diagnoses:  Principal Problem:   Infection of prosthetic knee joint Active Problems:   Osteomyelitis of right knee region   Hyperlipidemia   Hypertension   History of present illness:  [name] [name] is a 64 y.o. female with history of bilateral knee replacements, septic arthritis of the right knee, hypertension, hyperlipidemia who presents with 4 days of right knee pain swelling warmth and drainage. In the past septic arthritis was noted to be secondary to MSSA- she was bacteremic as well and was treated with 6 weeks of Ancef followed by 6 months of Keflex.  Hospital Course:  Principal Problem:  Infection of prosthetic knee joint/osteomyelitis -Has history of MSSA infection of same knee 10/14 -s/p irrigation and debridement and placement of an antibiotic impregnated spacer on 9/21 -wound culture growing MSSA -Continue cefazolin and rifampin 42 days (6 wks)- stop date- 10/3 -PICC line placed - Continue current dose of MS Contin with Oxycodone for breakthrough pain -Per ortho,WBAT, must be in knee immobilizer at all times but may open to place ice packs-NO range of motion of the right knee and no "excessive ambulation", dressing change when necessary, follow-up with ortho in 1-2 weeks - Lovenox for DVT prophylaxis per ortho  Active Problems:  Anemia -Likely secondary to acute blood loss from surgery - asymptomatic and therefore, no indication for transfusion -ferrous sulfate started-   Hyperlipidemia -Continue statin   Hypertension -Continue nebivolol and Diovan    Procedures:  10/2214- I and D by Mckinley Jewel, MD  Consultations:  Ortho  ID   Discharge Exam: Filed Weights   11/04/14 0825  Weight: 114.08 kg (251 lb 8 oz)    Filed Vitals:   11/09/14 0619  BP: 144/59  Pulse: 102  Temp: 98.3 F (36.8 C)  Resp: 18    General: AAO x 3, no distress Cardiovascular: RRR, no murmurs  Respiratory: clear to auscultation bilaterally GI: soft, non-tender, non-distended, bowel sound positive Extremity: right knee in immobilizer, no cyanosis, clubbing or edema  Discharge Instructions You were cared for by a hospitalist during your hospital stay. If you have any questions about your discharge medications or the care you received while you were in the hospital after you are discharged, you can call the unit and asked to speak with the hospitalist on call if the hospitalist that took care of you is not available. Once you are discharged, your primary care physician will handle any further medical issues. Please note that NO REFILLS for any discharge medications will be authorized once you are discharged, as it is imperative that you return to your primary care physician (or establish a relationship with a primary care physician if you do not have one) for your aftercare needs so that they can reassess your need for medications and monitor your lab values.      Discharge Instructions    Diet - low sodium heart healthy    Complete by:  As directed      Increase activity slowly    Complete by:  As directed             Medication List    STOP taking these medications        BC HEADACHE POWDER PO      TAKE these medications        acetaminophen  325 MG tablet  Commonly known as:  TYLENOL  Take 2 tablets (650 mg total) by mouth every 4 (four) hours as needed for mild pain, fever or headache (or Fever >/= 101).     aspirin 81 MG tablet  Take 81 mg by mouth every morning.     atorvastatin 40 MG tablet  Commonly known as:  LIPITOR  Take 40 mg by mouth every morning.     docusate sodium 100 MG capsule  Commonly known as:  COLACE  Take 1 capsule (100 mg total) by mouth 2 (two) times daily.     enoxaparin 30  MG/0.3ML injection  Commonly known as:  LOVENOX  Inject 0.3 mLs (30 mg total) into the skin every 12 (twelve) hours. Use Lovenox injections as directed q12 hours x 14 days following surgery to prevent blood clots     ferrous sulfate 325 (65 FE) MG tablet  Take 1 tablet (325 mg total) by mouth 2 (two) times daily with a meal.     morphine 15 MG 12 hr tablet  Commonly known as:  MS CONTIN  Take 1 tablet (15 mg total) by mouth every 12 (twelve) hours.     nebivolol 10 MG tablet  Commonly known as:  BYSTOLIC  Take 10 mg by mouth every morning.     oxyCODONE-acetaminophen 5-325 MG per tablet  Commonly known as:  ROXICET  Take 1-2 tablets by mouth every 4 (four) hours as needed.     rifampin 300 MG capsule  Commonly known as:  RIFADIN  Take 1 capsule (300 mg total) by mouth daily.     valsartan 160 MG tablet  Commonly known as:  DIOVAN  Take 160 mg by mouth daily.       No Known Allergies Follow-up Information    Follow up with Loreta Ave, MD. Schedule an appointment as soon as possible for a visit in 2 weeks.   Specialty:  Orthopedic Surgery   Contact information:   [address] [place] [address] [phone]        The results of significant diagnostics from this hospitalization (including imaging, microbiology, ancillary and laboratory) are listed below for reference.    Significant Diagnostic Studies: Dg Knee 1-2 Views Right  11/04/2014   CLINICAL DATA:  Right knee drainage for 4 days. Previous right knee arthroplasty. No injury.  EXAM: RIGHT KNEE - 1-2 VIEW  COMPARISON:  09/09/2013  FINDINGS: Right total knee arthroplasty. Components appear grossly well-seated although oblique positioning of the lateral view limits evaluation of the femoral component. No evidence of acute fracture or dislocation. No significant effusion. Soft tissue swelling in the subcutaneous fat superficial to the patella may represent edema or cellulitis.  IMPRESSION:  Subcutaneous soft tissue swelling. Right total knee arthroplasty appears grossly well seated. No acute fractures.   Electronically Signed   By: Burman Nieves M.D.   On: 11/04/2014 06:03   US Aspiration  11/04/2014   CLINICAL DATA:  PRIOR RIGHT KNEE ARTHROPLASTY, SEPTIC ARTHRITIS  EXAM: ULTRASOUND GUIDED NEEDLE ASPIRATE OF RIGHT KNEE JOINT  MEDICATIONS: 1% lidocaine locally  PROCEDURE: The procedure, risks, benefits, and alternatives were explained to the patient. Questions regarding the procedure were encouraged and answered. The patient understands and consents to the procedure.  The medial right knee was prepped with ChloraPrep in a sterile fashion, and a sterile drape was applied covering the operative field. A sterile gown and sterile gloves were used for the procedure. Local anesthesia was  provided with 1% Lidocaine.  Previous imaging reviewed. Preliminary ultrasound performed. Moderate right knee joint effusion noted. Under sterile conditions and local anesthesia, a 19 gauge sheath needle was advanced percutaneously under direct ultrasound into the medial aspect of the right knee joint. Syringe aspiration yielded 10 cc extubated blood tinged synovial fluid. Sample sent for Gram stain and culture. Needle removed.  COMPLICATIONS: None.  FINDINGS: Imaging confirms needle placed in in the right knee joint for aspiration  IMPRESSION: Successful ultrasound right knee joint aspiration. Sample sent for lab analysis and culture.   Electronically Signed   By: Judie Petit.  Shick M.D.   On: 11/04/2014 15:52    Microbiology: Recent Results (from the past 240 hour(s))  Blood culture (routine x 2)     Status: None (Preliminary result)   Collection Time: 11/04/14  5:07 AM  Result Value Ref Range Status   Specimen Description BLOOD RIGHT HAND  Final   Special Requests BOTTLES DRAWN AEROBIC ONLY 8.5ML  Final   Culture   Final    NO GROWTH 4 DAYS Performed at [facility]    Report Status PENDING  Incomplete   Wound culture     Status: None   Collection Time: 11/04/14  5:12 AM  Result Value Ref Range Status   Specimen Description WOUND KNEE  Final   Special Requests NONE  Final   Gram Stain   Final    NO WBC SEEN FEW SQUAMOUS EPITHELIAL CELLS PRESENT NO ORGANISMS SEEN Performed at [facility]    Culture   Final    MODERATE STAPHYLOCOCCUS AUREUS Note: RIFAMPIN AND GENTAMICIN SHOULD NOT BE USED AS SINGLE DRUGS FOR TREATMENT OF STAPH INFECTIONS. Performed at [facility]    Report Status 11/06/2014 FINAL  Final   Organism ID, Bacteria STAPHYLOCOCCUS AUREUS  Final      Susceptibility   Staphylococcus aureus - MIC*    CLINDAMYCIN <=0.25 SENSITIVE Sensitive     ERYTHROMYCIN <=0.25 SENSITIVE Sensitive     GENTAMICIN <=0.5 SENSITIVE Sensitive     LEVOFLOXACIN 0.25 SENSITIVE Sensitive     OXACILLIN 0.5 SENSITIVE Sensitive     RIFAMPIN <=0.5 SENSITIVE Sensitive     TRIMETH/SULFA <=10 SENSITIVE Sensitive     VANCOMYCIN 1 SENSITIVE Sensitive     TETRACYCLINE <=1 SENSITIVE Sensitive     MOXIFLOXACIN <=0.25 SENSITIVE Sensitive     * MODERATE STAPHYLOCOCCUS AUREUS  Blood culture (routine x 2)     Status: None (Preliminary result)   Collection Time: 11/04/14  5:21 AM  Result Value Ref Range Status   Specimen Description BLOOD LEFT HAND  Final   Special Requests BOTTLES DRAWN AEROBIC AND ANAEROBIC  Final   Culture   Final    NO GROWTH 4 DAYS Performed at [facility]    Report Status PENDING  Incomplete  Anaerobic culture     Status: None   Collection Time: 11/04/14 12:50 PM  Result Value Ref Range Status   Specimen Description FLUID RIGHT KNEE  Final   Special Requests Normal  Final   Gram Stain   Final    ABUNDANT WBC PRESENT,BOTH PMN AND MONONUCLEAR FEW GRAM POSITIVE COCCI IN PAIRS    Culture   Final    NO ANAEROBES ISOLATED Performed at [facility]    Report Status 11/09/2014 FINAL  Final  Fungus culture w smear     Status: None (Preliminary  result)   Collection Time: 11/04/14 12:50 PM  Result Value Ref Range Status  Specimen Description KNEE JOINT FLUID RIGHT  Final   Special Requests Normal  Final   Fungal Smear   Final    NO YEAST OR FUNGAL ELEMENTS SEEN Performed at [facility]    Culture   Final    CULTURE IN PROGRESS FOR FOUR WEEKS Performed at [facility]    Report Status PENDING  Incomplete  Body fluid culture     Status: None   Collection Time: 11/04/14 12:50 PM  Result Value Ref Range Status   Specimen Description FLUID RIGHT KNEE  Final   Special Requests NONE  Final   Gram Stain   Final    ABUNDANT WBC PRESENT,BOTH PMN AND MONONUCLEAR FEW GRAM POSITIVE COCCI IN PAIRS    Culture   Final    MODERATE STAPHYLOCOCCUS AUREUS Performed at [facility]    Report Status 11/08/2014 FINAL  Final   Organism ID, Bacteria STAPHYLOCOCCUS AUREUS  Final      Susceptibility   Staphylococcus aureus - MIC*    CIPROFLOXACIN <=0.5 SENSITIVE Sensitive     ERYTHROMYCIN 0.5 SENSITIVE Sensitive     GENTAMICIN <=0.5 SENSITIVE Sensitive     OXACILLIN 0.5 SENSITIVE Sensitive     TETRACYCLINE <=1 SENSITIVE Sensitive     VANCOMYCIN <=0.5 SENSITIVE Sensitive     TRIMETH/SULFA <=10 SENSITIVE Sensitive     CLINDAMYCIN <=0.25 SENSITIVE Sensitive     RIFAMPIN <=0.5 SENSITIVE Sensitive     Inducible Clindamycin NEGATIVE Sensitive     * MODERATE STAPHYLOCOCCUS AUREUS  Urine culture     Status: None   Collection Time: 11/05/14 10:55 PM  Result Value Ref Range Status   Specimen Description URINE, RANDOM  Final   Special Requests NONE  Final   Culture   Final    20,000 COLONIES/mL PROTEUS MIRABILIS 20,000 COLONIES/mL ENTEROCOCCUS SPECIES    Report Status 11/08/2014 FINAL  Final   Organism ID, Bacteria PROTEUS MIRABILIS  Final   Organism ID, Bacteria ENTEROCOCCUS SPECIES  Final      Susceptibility   Proteus mirabilis - MIC*    AMPICILLIN <=2 SENSITIVE Sensitive     CEFAZOLIN <=4 SENSITIVE Sensitive      CEFTRIAXONE <=1 SENSITIVE Sensitive     CIPROFLOXACIN <=0.25 SENSITIVE Sensitive     GENTAMICIN <=1 SENSITIVE Sensitive     IMIPENEM 4 SENSITIVE Sensitive     NITROFURANTOIN 64 RESISTANT Resistant     TRIMETH/SULFA <=20 SENSITIVE Sensitive     AMPICILLIN/SULBACTAM <=2 SENSITIVE Sensitive     PIP/TAZO <=4 SENSITIVE Sensitive     * 20,000 COLONIES/mL PROTEUS MIRABILIS   Enterococcus species - MIC*    AMPICILLIN <=2 SENSITIVE Sensitive     LEVOFLOXACIN 1 SENSITIVE Sensitive     NITROFURANTOIN <=16 SENSITIVE Sensitive     VANCOMYCIN 1 SENSITIVE Sensitive     * 20,000 COLONIES/mL ENTEROCOCCUS SPECIES  Surgical pcr screen     Status: None   Collection Time: 11/05/14 10:57 PM  Result Value Ref Range Status   MRSA, PCR NEGATIVE NEGATIVE Final   Staphylococcus aureus NEGATIVE NEGATIVE Final    Comment:        The Xpert SA Assay (FDA approved for NASAL specimens in patients over 71 years of age), is one component of a comprehensive surveillance program.  Test performance has been validated by [facility] for patients greater than or equal to 26 year old. It is not intended to diagnose infection nor to guide or monitor treatment.      Labs: Basic  Metabolic Panel:  Recent Labs Lab 11/04/14 0507 11/05/14 1357 11/05/14 2245 11/08/14 0800 11/09/14 0425  NA 139 140 140 139 140  K 4.6 3.8 4.0 3.8 3.9  CL 107 111 110 109 106  CO2 GLUCOSE 99 103* 107* 148* 110*  BUN 7 <5* 8 5* 5*  CREATININE 0.69 0.49 0.85 0.78 0.81  CALCIUM 9.7 9.2 9.2 8.9 8.9   Liver Function Tests:  Recent Labs Lab 11/04/14 0507 11/05/14 1357 11/05/14 2245  AST 16 13* 17  ALT 7* 8* 8*  ALKPHOS 101 88 95  BILITOT 0.6 0.7 0.5  PROT 8.7* 7.8 8.3*  ALBUMIN 3.6 3.2* 3.2*   No results for input(s): LIPASE, AMYLASE in the last 168 hours. No results for input(s): AMMONIA in the last 168 hours. CBC:  Recent Labs Lab 11/04/14 0507 11/05/14 1357 11/05/14 2245 11/08/14 0800  11/08/14 1124 11/09/14 0425  WBC 7.7 5.5 6.6 7.9 8.8 7.0  NEUTROABS 4.4  --  4.1  --   --   --   HGB 11.0* 10.7* 11.2* 7.7* 8.0* 7.8*  HCT 36.0 35.8* 37.3 25.7* 26.1* 25.8*  MCV 68.3* 69.4* 70.2* 70.0* 70.0* 70.3*  PLT 407* 360 379 277 345 293   Cardiac Enzymes: No results for input(s): CKTOTAL, CKMB, CKMBINDEX, TROPONINI in the last 168 hours. BNP: BNP (last 3 results) No results for input(s): BNP in the last 8760 hours.  ProBNP (last 3 results) No results for input(s): PROBNP in the last 8760 hours.  CBG: No results for input(s): GLUCAP in the last 168 hours.     SignedCalvert Cantor, MD [facility] 11/09/2014, 9:42 AM

## 2014-11-08 NOTE — Progress Notes (Addendum)
[facility] Progress Note   [name] [name]  ZOX:[id number]  DOB: 09-14-1950  DOA: 11/04/2014 PCP: Lupita Raider, MD  Brief narrative: [name] [name] is a 64 y.o. female with history of bilateral knee replacements, septic arthritis of the right knee, hypertension, hyperlipidemia who presents with 4 days of right knee pain swelling warmth and drainage. In the past septic arthritis was noted to be secondary to MSSA- she was bacteremic as well and was treated with 6 weeks of Ancef followed by 6 months of Keflex.   Subjective: Pain was well controlled through the night and she was able to sleep through the night without asking for breakthrough medication. She is in pain this morning which is relatively well controlled with the current medications that are ordered. No complaints of fevers chills shortness of breath cough nausea vomiting or diarrhea.  Assessment/Plan: Principal Problem:   Infection of prosthetic knee joint/osteomyelitis -Has history of MSSA infection of same knee 10/14 -s/p irrigation and debridement and placement of a spacer on 9/21 -wound culture growing MSSA -Continue cefazolin and rifampin 42 days (6 wks) 10/3 -PICC line placed - Continue current dose of MS Contin with Oxycodone for breakthrough pain -Per ortho, must be in knee immobilizer at all times but may open to place ice packs-no range of motion of the right knee and no "excessive ambulation", dressing change when necessary, follow-up with ortho in 1-2 weeks  Active Problems:  Anemia -Likely secondary to acute blood loss -Rechecking hemoglobin-if still low, we'll transfuse     Hyperlipidemia -Continue statin    Hypertension -Continue nebivolol and Diovan    Code Status:     Code Status Orders        Start     Ordered   11/04/14 0732  Full code   Continuous     11/04/14 0732     Disposition Plan:  skilled nursing facility DVT prophylaxis: Lovenox Consultants: Orthopedic surgery,  infectious disease Procedures:  Antibiotics: Anti-infectives    Start     Dose/Rate Route Frequency Ordered Stop   11/07/14 1600  ceFAZolin (ANCEF) IVPB 2 g/50 mL premix     2 g 100 mL/hr over 30 Minutes Intravenous 3 times per day 11/07/14 1531     11/07/14 1600  rifampin (RIFADIN) capsule 300 mg     300 mg Oral Daily 11/07/14 1531     11/07/14 0100  vancomycin (VANCOCIN) 1,250 mg in sodium chloride 0.9 % 250 mL IVPB  Status:  Discontinued     1,250 mg 166.7 mL/hr over 90 Minutes Intravenous Every 12 hours 11/05/14 1745 11/07/14 1531   11/06/14 1500  vancomycin (VANCOCIN) 1,250 mg in sodium chloride 0.9 % 250 mL IVPB  Status:  Discontinued     1,250 mg 166.7 mL/hr over 90 Minutes Intravenous To Surgery 11/06/14 1451 11/06/14 1911   11/04/14 2200  vancomycin (VANCOCIN) 1,250 mg in sodium chloride 0.9 % 250 mL IVPB     1,250 mg 166.7 mL/hr over 90 Minutes Intravenous Every 12 hours 11/04/14 0846 11/06/14 0147   11/04/14 0745  vancomycin (VANCOCIN) 2,000 mg in sodium chloride 0.9 % 500 mL IVPB     2,000 mg 250 mL/hr over 120 Minutes Intravenous  Once 11/04/14 0738 11/04/14 1056   11/04/14 0645  ceFAZolin (ANCEF) IVPB 2 g/50 mL premix     2 g 100 mL/hr over 30 Minutes Intravenous  Once 11/04/14 0634 11/04/14 0745      Objective: Filed Weights   11/04/14 0825  Weight: 114.08 kg (251 lb  8 oz)    Intake/Output Summary (Last 24 hours) at 11/08/14 1238 Last data filed at 11/08/14 0825  Gross per 24 hour  Intake   1690 ml  Output     87 ml  Net   1603 ml     Vitals Filed Vitals:   11/07/14 0623 11/07/14 1300 11/07/14 2113 11/08/14 0700  BP: 164/42 122/41 169/60 178/62  Pulse: 98 100 106 111  Temp: 98.4 F (36.9 C) 98 F (36.7 C) 98.1 F (36.7 C) 100.1 F (37.8 C)  TempSrc: Oral Oral Oral Oral  Resp: Height:      Weight:      SpO2: 100% 100% 97% 98%    Exam:  General:  Pt is alert, not in acute distress  HEENT: No icterus, No thrush, oral mucosa  moist  Cardiovascular: regular rate and rhythm, S1/S2 No murmur  Respiratory: clear to auscultation bilaterally   Abdomen: Soft, +Bowel sounds, non tender, non distended, no guarding  MSK: No LE cyanosis or clubbing-right knee in knee immobilizer  Data Reviewed: Basic Metabolic Panel:  Recent Labs Lab 11/04/14 0507 11/05/14 1357 11/05/14 2245 11/08/14 0800  NA 139 140 140 139  K 4.6 3.8 4.0 3.8  CL 107 111 110 109  CO2 GLUCOSE 99 103* 107* 148*  BUN 7 <5* 8 5*  CREATININE 0.69 0.49 0.85 0.78  CALCIUM 9.7 9.2 9.2 8.9   Liver Function Tests:  Recent Labs Lab 11/04/14 0507 11/05/14 1357 11/05/14 2245  AST 16 13* 17  ALT 7* 8* 8*  ALKPHOS 101 88 95  BILITOT 0.6 0.7 0.5  PROT 8.7* 7.8 8.3*  ALBUMIN 3.6 3.2* 3.2*   No results for input(s): LIPASE, AMYLASE in the last 168 hours. No results for input(s): AMMONIA in the last 168 hours. CBC:  Recent Labs Lab 11/04/14 0507 11/05/14 1357 11/05/14 2245 11/08/14 0800  WBC 7.7 5.5 6.6 7.9  NEUTROABS 4.4  --  4.1  --   HGB 11.0* 10.7* 11.2* 7.7*  HCT 36.0 35.8* 37.3 25.7*  MCV 68.3* 69.4* 70.2* 70.0*  PLT 407* 360 379 277   Cardiac Enzymes: No results for input(s): CKTOTAL, CKMB, CKMBINDEX, TROPONINI in the last 168 hours. BNP (last 3 results) No results for input(s): BNP in the last 8760 hours.  ProBNP (last 3 results) No results for input(s): PROBNP in the last 8760 hours.  CBG: No results for input(s): GLUCAP in the last 168 hours.  Recent Results (from the past 240 hour(s))  Blood culture (routine x 2)     Status: None (Preliminary result)   Collection Time: 11/04/14  5:07 AM  Result Value Ref Range Status   Specimen Description BLOOD RIGHT HAND  Final   Special Requests BOTTLES DRAWN AEROBIC ONLY 8.5ML  Final   Culture   Final    NO GROWTH 3 DAYS Performed at [facility]    Report Status PENDING  Incomplete  Wound culture     Status: None   Collection Time: 11/04/14  5:12 AM   Result Value Ref Range Status   Specimen Description WOUND KNEE  Final   Special Requests NONE  Final   Gram Stain   Final    NO WBC SEEN FEW SQUAMOUS EPITHELIAL CELLS PRESENT NO ORGANISMS SEEN Performed at [facility]    Culture   Final    MODERATE STAPHYLOCOCCUS AUREUS Note: RIFAMPIN AND GENTAMICIN SHOULD NOT BE USED AS SINGLE DRUGS FOR  TREATMENT OF STAPH INFECTIONS. Performed at [facility]    Report Status 11/06/2014 FINAL  Final   Organism ID, Bacteria STAPHYLOCOCCUS AUREUS  Final      Susceptibility   Staphylococcus aureus - MIC*    CLINDAMYCIN <=0.25 SENSITIVE Sensitive     ERYTHROMYCIN <=0.25 SENSITIVE Sensitive     GENTAMICIN <=0.5 SENSITIVE Sensitive     LEVOFLOXACIN 0.25 SENSITIVE Sensitive     OXACILLIN 0.5 SENSITIVE Sensitive     RIFAMPIN <=0.5 SENSITIVE Sensitive     TRIMETH/SULFA <=10 SENSITIVE Sensitive     VANCOMYCIN 1 SENSITIVE Sensitive     TETRACYCLINE <=1 SENSITIVE Sensitive     MOXIFLOXACIN <=0.25 SENSITIVE Sensitive     * MODERATE STAPHYLOCOCCUS AUREUS  Blood culture (routine x 2)     Status: None (Preliminary result)   Collection Time: 11/04/14  5:21 AM  Result Value Ref Range Status   Specimen Description BLOOD LEFT HAND  Final   Special Requests BOTTLES DRAWN AEROBIC AND ANAEROBIC  Final   Culture   Final    NO GROWTH 3 DAYS Performed at [facility]    Report Status PENDING  Incomplete  Anaerobic culture     Status: None (Preliminary result)   Collection Time: 11/04/14 12:50 PM  Result Value Ref Range Status   Specimen Description FLUID RIGHT KNEE  Final   Special Requests Normal  Final   Gram Stain   Final    ABUNDANT WBC PRESENT,BOTH PMN AND MONONUCLEAR FEW GRAM POSITIVE COCCI IN PAIRS    Culture   Final    NO ANAEROBES ISOLATED; CULTURE IN PROGRESS FOR 5 DAYS Performed at [facility]    Report Status PENDING  Incomplete  Fungus culture w smear     Status: None (Preliminary result)   Collection  Time: 11/04/14 12:50 PM  Result Value Ref Range Status   Specimen Description KNEE JOINT FLUID RIGHT  Final   Special Requests Normal  Final   Fungal Smear   Final    NO YEAST OR FUNGAL ELEMENTS SEEN Performed at [facility]    Culture   Final    CULTURE IN PROGRESS FOR FOUR WEEKS Performed at [facility]    Report Status PENDING  Incomplete  Body fluid culture     Status: None (Preliminary result)   Collection Time: 11/04/14 12:50 PM  Result Value Ref Range Status   Specimen Description FLUID RIGHT KNEE  Final   Special Requests NONE  Final   Gram Stain   Final    ABUNDANT WBC PRESENT,BOTH PMN AND MONONUCLEAR FEW GRAM POSITIVE COCCI IN PAIRS    Culture   Final    MODERATE STAPHYLOCOCCUS AUREUS Performed at [facility]    Report Status PENDING  Incomplete  Urine culture     Status: None   Collection Time: 11/05/14 10:55 PM  Result Value Ref Range Status   Specimen Description URINE, RANDOM  Final   Special Requests NONE  Final   Culture   Final    20,000 COLONIES/mL PROTEUS MIRABILIS 20,000 COLONIES/mL ENTEROCOCCUS SPECIES    Report Status 11/08/2014 FINAL  Final   Organism ID, Bacteria PROTEUS MIRABILIS  Final   Organism ID, Bacteria ENTEROCOCCUS SPECIES  Final      Susceptibility   Proteus mirabilis - MIC*    AMPICILLIN <=2 SENSITIVE Sensitive     CEFAZOLIN <=4 SENSITIVE Sensitive     CEFTRIAXONE <=1 SENSITIVE Sensitive     CIPROFLOXACIN <=0.25  SENSITIVE Sensitive     GENTAMICIN <=1 SENSITIVE Sensitive     IMIPENEM 4 SENSITIVE Sensitive     NITROFURANTOIN 64 RESISTANT Resistant     TRIMETH/SULFA <=20 SENSITIVE Sensitive     AMPICILLIN/SULBACTAM <=2 SENSITIVE Sensitive     PIP/TAZO <=4 SENSITIVE Sensitive     * 20,000 COLONIES/mL PROTEUS MIRABILIS   Enterococcus species - MIC*    AMPICILLIN <=2 SENSITIVE Sensitive     LEVOFLOXACIN 1 SENSITIVE Sensitive     NITROFURANTOIN <=16 SENSITIVE Sensitive     VANCOMYCIN 1 SENSITIVE Sensitive      * 20,000 COLONIES/mL ENTEROCOCCUS SPECIES  Surgical pcr screen     Status: None   Collection Time: 11/05/14 10:57 PM  Result Value Ref Range Status   MRSA, PCR NEGATIVE NEGATIVE Final   Staphylococcus aureus NEGATIVE NEGATIVE Final    Comment:        The Xpert SA Assay (FDA approved for NASAL specimens in patients over 39 years of age), is one component of a comprehensive surveillance program.  Test performance has been validated by [facility] for patients greater than or equal to 69 year old. It is not intended to diagnose infection nor to guide or monitor treatment.      Studies: No results found.  Scheduled Meds:  Scheduled Meds: . sodium chloride   Intravenous Once  . atorvastatin  40 mg Oral q morning - 10a  . bupivacaine liposome  20 mL Infiltration Once  .  ceFAZolin (ANCEF) IV  2 g Intravenous 3 times per day  . docusate sodium  100 mg Oral BID  . enoxaparin (LOVENOX) injection  50 mg Subcutaneous Q24H  . morphine  15 mg Oral Q12H  . nebivolol  10 mg Oral q morning - 10a  . rifampin  300 mg Oral Daily  . sodium chloride  3 mL Intravenous Q12H   Continuous Infusions:    Time spent on care of this patient: 35 min   RIZWAN,SAIMA, MD 11/08/2014, 12:38 PM  LOS: 4 days   [facility] Office  [phone] Pager - Text Page per [email or web address] If 7PM-7AM, please contact night-coverage [email or web address]

## 2014-11-08 NOTE — Progress Notes (Addendum)
INFECTIOUS DISEASE PROGRESS NOTE  ID: [name] [name] is a 64 y.o. female with  Principal Problem:   Infection of prosthetic knee joint Active Problems:   Hyperlipidemia   Hypertension  Subjective: C/o R shoulder arthritis  Abtx:  Anti-infectives    Start     Dose/Rate Route Frequency Ordered Stop   11/07/14 1600  ceFAZolin (ANCEF) IVPB 2 g/50 mL premix     2 g 100 mL/hr over 30 Minutes Intravenous 3 times per day 11/07/14 1531     11/07/14 1600  rifampin (RIFADIN) capsule 300 mg     300 mg Oral Daily 11/07/14 1531     11/07/14 0100  vancomycin (VANCOCIN) 1,250 mg in sodium chloride 0.9 % 250 mL IVPB  Status:  Discontinued     1,250 mg 166.7 mL/hr over 90 Minutes Intravenous Every 12 hours 11/05/14 1745 11/07/14 1531   11/06/14 1500  vancomycin (VANCOCIN) 1,250 mg in sodium chloride 0.9 % 250 mL IVPB  Status:  Discontinued     1,250 mg 166.7 mL/hr over 90 Minutes Intravenous To Surgery 11/06/14 1451 11/06/14 1911   11/04/14 2200  vancomycin (VANCOCIN) 1,250 mg in sodium chloride 0.9 % 250 mL IVPB     1,250 mg 166.7 mL/hr over 90 Minutes Intravenous Every 12 hours 11/04/14 0846 11/06/14 0147   11/04/14 0745  vancomycin (VANCOCIN) 2,000 mg in sodium chloride 0.9 % 500 mL IVPB     2,000 mg 250 mL/hr over 120 Minutes Intravenous  Once 11/04/14 0738 11/04/14 1056   11/04/14 0645  ceFAZolin (ANCEF) IVPB 2 g/50 mL premix     2 g 100 mL/hr over 30 Minutes Intravenous  Once 11/04/14 0634 11/04/14 0745      Medications:  Scheduled: . atorvastatin  40 mg Oral q morning - 10a  . bupivacaine liposome  20 mL Infiltration Once  .  ceFAZolin (ANCEF) IV  2 g Intravenous 3 times per day  . docusate sodium  100 mg Oral BID  . enoxaparin (LOVENOX) injection  50 mg Subcutaneous Q24H  . morphine  15 mg Oral Q12H  . nebivolol  10 mg Oral q morning - 10a  . rifampin  300 mg Oral Daily  . sodium chloride  3 mL Intravenous Q12H    Objective: Vital signs in last 24 hours: Temp:  [98 F  (36.7 C)-100.1 F (37.8 C)] 100.1 F (37.8 C) (09/23 0700) Pulse Rate:  [100-111] 111 (09/23 0700) Resp:  [19-20] 19 (09/23 0700) BP: (122-178)/(41-62) 178/62 mmHg (09/23 0700) SpO2:  [97 %-100 %] 98 % (09/23 0700)   General appearance: alert, cooperative and no distress Incision/Wound: RLE dressed. Normal light touch distally  Lab Results  Recent Labs  11/05/14 2245 11/08/14 0800  WBC 6.6 7.9  HGB 11.2* 7.7*  HCT 37.3 25.7*  NA 140 139  K 4.0 3.8  CL 110 109  CO2 22 25  BUN 8 5*  CREATININE 0.85 0.78   Liver Panel  Recent Labs  11/05/14 1357 11/05/14 2245  PROT 7.8 8.3*  ALBUMIN 3.2* 3.2*  AST 13* 17  ALT 8* 8*  ALKPHOS 88 95  BILITOT 0.7 0.5   Sedimentation Rate No results for input(s): ESRSEDRATE in the last 72 hours. C-Reactive Protein No results for input(s): CRP in the last 72 hours.  Microbiology: Recent Results (from the past 240 hour(s))  Blood culture (routine x 2)     Status: None (Preliminary result)   Collection Time: 11/04/14  5:07 AM  Result Value Ref  Range Status   Specimen Description BLOOD RIGHT HAND  Final   Special Requests BOTTLES DRAWN AEROBIC ONLY 8.5ML  Final   Culture   Final    NO GROWTH 3 DAYS Performed at [facility]    Report Status PENDING  Incomplete  Wound culture     Status: None   Collection Time: 11/04/14  5:12 AM  Result Value Ref Range Status   Specimen Description WOUND KNEE  Final   Special Requests NONE  Final   Gram Stain   Final    NO WBC SEEN FEW SQUAMOUS EPITHELIAL CELLS PRESENT NO ORGANISMS SEEN Performed at [facility]    Culture   Final    MODERATE STAPHYLOCOCCUS AUREUS Note: RIFAMPIN AND GENTAMICIN SHOULD NOT BE USED AS SINGLE DRUGS FOR TREATMENT OF STAPH INFECTIONS. Performed at [facility]    Report Status 11/06/2014 FINAL  Final   Organism ID, Bacteria STAPHYLOCOCCUS AUREUS  Final      Susceptibility   Staphylococcus aureus - MIC*    CLINDAMYCIN <=0.25 SENSITIVE  Sensitive     ERYTHROMYCIN <=0.25 SENSITIVE Sensitive     GENTAMICIN <=0.5 SENSITIVE Sensitive     LEVOFLOXACIN 0.25 SENSITIVE Sensitive     OXACILLIN 0.5 SENSITIVE Sensitive     RIFAMPIN <=0.5 SENSITIVE Sensitive     TRIMETH/SULFA <=10 SENSITIVE Sensitive     VANCOMYCIN 1 SENSITIVE Sensitive     TETRACYCLINE <=1 SENSITIVE Sensitive     MOXIFLOXACIN <=0.25 SENSITIVE Sensitive     * MODERATE STAPHYLOCOCCUS AUREUS  Blood culture (routine x 2)     Status: None (Preliminary result)   Collection Time: 11/04/14  5:21 AM  Result Value Ref Range Status   Specimen Description BLOOD LEFT HAND  Final   Special Requests BOTTLES DRAWN AEROBIC AND ANAEROBIC  Final   Culture   Final    NO GROWTH 3 DAYS Performed at [facility]    Report Status PENDING  Incomplete  Anaerobic culture     Status: None (Preliminary result)   Collection Time: 11/04/14 12:50 PM  Result Value Ref Range Status   Specimen Description FLUID RIGHT KNEE  Final   Special Requests Normal  Final   Gram Stain   Final    ABUNDANT WBC PRESENT,BOTH PMN AND MONONUCLEAR FEW GRAM POSITIVE COCCI IN PAIRS    Culture   Final    NO ANAEROBES ISOLATED; CULTURE IN PROGRESS FOR 5 DAYS Performed at [facility]    Report Status PENDING  Incomplete  Fungus culture w smear     Status: None (Preliminary result)   Collection Time: 11/04/14 12:50 PM  Result Value Ref Range Status   Specimen Description KNEE JOINT FLUID RIGHT  Final   Special Requests Normal  Final   Fungal Smear   Final    NO YEAST OR FUNGAL ELEMENTS SEEN Performed at [facility]    Culture   Final    CULTURE IN PROGRESS FOR FOUR WEEKS Performed at [facility]    Report Status PENDING  Incomplete  Body fluid culture     Status: None (Preliminary result)   Collection Time: 11/04/14 12:50 PM  Result Value Ref Range Status   Specimen Description FLUID RIGHT KNEE  Final   Special Requests NONE  Final   Gram Stain   Final     ABUNDANT WBC PRESENT,BOTH PMN AND MONONUCLEAR FEW GRAM POSITIVE COCCI IN PAIRS    Culture   Final  MODERATE STAPHYLOCOCCUS AUREUS Performed at [facility]    Report Status PENDING  Incomplete  Urine culture     Status: None (Preliminary result)   Collection Time: 11/05/14 10:55 PM  Result Value Ref Range Status   Specimen Description URINE, RANDOM  Final   Special Requests NONE  Final   Culture CULTURE REINCUBATED FOR BETTER GROWTH  Final   Report Status PENDING  Incomplete  Surgical pcr screen     Status: None   Collection Time: 11/05/14 10:57 PM  Result Value Ref Range Status   MRSA, PCR NEGATIVE NEGATIVE Final   Staphylococcus aureus NEGATIVE NEGATIVE Final    Comment:        The Xpert SA Assay (FDA approved for NASAL specimens in patients over 78 years of age), is one component of a comprehensive surveillance program.  Test performance has been validated by [facility] for patients greater than or equal to 27 year old. It is not intended to diagnose infection nor to guide or monitor treatment.     Studies/Results: No results found.   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  Available as needed.          Johny Sax Infectious Diseases (pager) [phone] [email or web address] 11/08/2014, 8:48 AM  LOS: 4 days

## 2014-11-08 NOTE — Care Management Important Message (Signed)
Important Message  Patient Details  Name: [name] [name] MRN: [id number] Date of Birth: 10-20-1950   Medicare Important Message Given:  Yes-third notification given    Bernadette Hoit 11/08/2014, 11:11 AM

## 2014-11-09 DIAGNOSIS — B999 Unspecified infectious disease: Secondary | ICD-10-CM | POA: Diagnosis not present

## 2014-11-09 DIAGNOSIS — Z765 Malingerer [conscious simulation]: Secondary | ICD-10-CM | POA: Diagnosis not present

## 2014-11-09 DIAGNOSIS — T8450XA Infection and inflammatory reaction due to unspecified internal joint prosthesis, initial encounter: Secondary | ICD-10-CM | POA: Diagnosis not present

## 2014-11-09 DIAGNOSIS — R5381 Other malaise: Secondary | ICD-10-CM | POA: Diagnosis not present

## 2014-11-09 DIAGNOSIS — M6281 Muscle weakness (generalized): Secondary | ICD-10-CM | POA: Diagnosis not present

## 2014-11-09 DIAGNOSIS — N10 Acute tubulo-interstitial nephritis: Secondary | ICD-10-CM | POA: Diagnosis not present

## 2014-11-09 DIAGNOSIS — F4320 Adjustment disorder, unspecified: Secondary | ICD-10-CM | POA: Diagnosis not present

## 2014-11-09 DIAGNOSIS — I10 Essential (primary) hypertension: Secondary | ICD-10-CM | POA: Diagnosis not present

## 2014-11-09 DIAGNOSIS — K5900 Constipation, unspecified: Secondary | ICD-10-CM | POA: Diagnosis not present

## 2014-11-09 DIAGNOSIS — Z4789 Encounter for other orthopedic aftercare: Secondary | ICD-10-CM | POA: Diagnosis not present

## 2014-11-09 DIAGNOSIS — Z452 Encounter for adjustment and management of vascular access device: Secondary | ICD-10-CM | POA: Diagnosis not present

## 2014-11-09 DIAGNOSIS — M869 Osteomyelitis, unspecified: Secondary | ICD-10-CM | POA: Diagnosis not present

## 2014-11-09 DIAGNOSIS — M00861 Arthritis due to other bacteria, right knee: Secondary | ICD-10-CM | POA: Diagnosis not present

## 2014-11-09 DIAGNOSIS — R600 Localized edema: Secondary | ICD-10-CM | POA: Diagnosis not present

## 2014-11-09 DIAGNOSIS — D62 Acute posthemorrhagic anemia: Secondary | ICD-10-CM | POA: Diagnosis not present

## 2014-11-09 DIAGNOSIS — T8450XS Infection and inflammatory reaction due to unspecified internal joint prosthesis, sequela: Secondary | ICD-10-CM | POA: Diagnosis not present

## 2014-11-09 DIAGNOSIS — T8453XD Infection and inflammatory reaction due to internal right knee prosthesis, subsequent encounter: Secondary | ICD-10-CM | POA: Diagnosis not present

## 2014-11-09 DIAGNOSIS — Z96651 Presence of right artificial knee joint: Secondary | ICD-10-CM | POA: Diagnosis not present

## 2014-11-09 DIAGNOSIS — E46 Unspecified protein-calorie malnutrition: Secondary | ICD-10-CM | POA: Diagnosis not present

## 2014-11-09 DIAGNOSIS — E785 Hyperlipidemia, unspecified: Secondary | ICD-10-CM | POA: Diagnosis not present

## 2014-11-09 DIAGNOSIS — M25561 Pain in right knee: Secondary | ICD-10-CM | POA: Diagnosis not present

## 2014-11-09 DIAGNOSIS — R7881 Bacteremia: Secondary | ICD-10-CM | POA: Diagnosis not present

## 2014-11-09 DIAGNOSIS — R2689 Other abnormalities of gait and mobility: Secondary | ICD-10-CM | POA: Diagnosis not present

## 2014-11-09 LAB — BASIC METABOLIC PANEL
ANION GAP: 8 (ref 5–15)
BUN: 5 mg/dL — ABNORMAL LOW (ref 6–20)
CHLORIDE: 106 mmol/L (ref 101–111)
CO2: 26 mmol/L (ref 22–32)
Calcium: 8.9 mg/dL (ref 8.9–10.3)
Creatinine, Ser: 0.81 mg/dL (ref 0.44–1.00)
GFR calc non Af Amer: >60 mL/min (ref >60)
GLUCOSE: 110 mg/dL — AB (ref 65–99)
Potassium: 3.9 mmol/L (ref 3.5–5.1)
Sodium: 140 mmol/L (ref 135–145)

## 2014-11-09 LAB — CULTURE, BLOOD (ROUTINE X 2)
Culture: NO GROWTH
Culture: NO GROWTH

## 2014-11-09 LAB — CBC
HEMATOCRIT: 25.8 % — AB (ref 36.0–46.0)
HEMOGLOBIN: 7.8 g/dL — AB (ref 12.0–15.0)
MCH: 21.3 pg — ABNORMAL LOW (ref 26.0–34.0)
MCHC: 30.2 g/dL (ref 30.0–36.0)
MCV: 70.3 fL — AB (ref 78.0–100.0)
Platelets: 293 10*3/uL (ref 150–400)
RBC: 3.67 MIL/uL — ABNORMAL LOW (ref 3.87–5.11)
RDW: 19.4 % — ABNORMAL HIGH (ref 11.5–15.5)
WBC: 7 10*3/uL (ref 4.0–10.5)

## 2014-11-09 LAB — ANAEROBIC CULTURE: Special Requests: NORMAL

## 2014-11-09 MED ORDER — HEPARIN SOD (PORK) LOCK FLUSH 100 UNIT/ML IV SOLN
250.0000 [IU] | INTRAVENOUS | Status: AC | PRN
  Administered 2014-11-09: 250 [IU]

## 2014-11-09 NOTE — Clinical Social Work Placement (Signed)
   CLINICAL SOCIAL WORK PLACEMENT  NOTE  Date:  11/09/2014  Patient Details  Name: [name] [name] MRN: [id number] Date of Birth: July 21, 1950  Clinical Social Work is seeking post-discharge placement for this patient at the Skilled  Nursing Facility level of care (*CSW will initial, date and re-position this form in  chart as items are completed):  Yes   Patient/family provided with [facility] Clinical Social Work Department's list of facilities offering this level of care within the geographic area requested by the patient (or if unable, by the patient's family).  Yes   Patient/family informed of their freedom to choose among providers that offer the needed level of care, that participate in Medicare, Medicaid or managed care program needed by the patient, have an available bed and are willing to accept the patient.  Yes   Patient/family informed of [facility]'s ownership interest in [facility] and [facility], as well as of the fact that they are under no obligation to receive care at these facilities.  PASRR submitted to EDS on  (n/a)     PASRR number received on  (n/a)     Existing PASRR number confirmed on 11/08/14     FL2 transmitted to all facilities in geographic area requested by pt/family on 11/08/14     FL2 transmitted to all facilities within larger geographic area on  (n/a)     Patient informed that his/her managed care company has contracts with or will negotiate with certain facilities, including the following:   (yes, [place])     Yes   Patient/family informed of bed offers received.  Patient chooses bed at [facility]     Physician recommends and patient chooses bed at      Patient to be transferred to [facility] on 11/09/14.  Patient to be transferred to facility by EMS     Patient family notified on 11/09/14 of transfer.  Name of family member notified:  pt notified at bedside.     PHYSICIAN Please sign FL2     Additional Comment:     _______________________________________________ Orson Eva, LCSW 11/09/2014, 10:33 AM

## 2014-11-09 NOTE — Progress Notes (Signed)
Physical Therapy Treatment Patient Details Name: [name] [name] MRN: [id number] DOB: 10-Oct-1950 Today's Date: 11/09/2014    History of Present Illness Pt is a 64 y/o F s/p I and D Rt total knee and placement of cemented spacer 2/2 Rt knee infection.  Her PMH includes hyperlipidemia, HTN, overactive bladder.    PT Comments    Patient moving extremely slowly and needs incr time to complete basic isometric strengthening exercises in bed and with max encouragement.  She was premedicated and declined to sit EOB, transfer or ambulate today.  Patient guarding her Rt leg and allowed therapist to touch her Rt leg only minimally.  Patient anticipates discharge to facility this pm, so will sign off. Equipment to be addressed at next venue of care.  Follow Up Recommendations  SNF;Supervision/Assistance - 24 hour     Equipment Recommendations  None recommended by PT    Recommendations for Other Services       Precautions / Restrictions Precautions Precautions: Fall;Knee (No ROM, limit ambulation distances) Precaution Booklet Issued: No Precaution Comments: reviewed positioning, use of knee immobilizer with return demo understanding Required Braces or Orthoses: Knee Immobilizer - Right Knee Immobilizer - Right: On at all times Restrictions Weight Bearing Restrictions: Yes RLE Weight Bearing: Weight bearing as tolerated Other Position/Activity Restrictions: No ROM Rt knee    Mobility  Bed Mobility               General bed mobility comments: declined getting to edge of bed, despite max encouragement  Transfers                 General transfer comment: declined getting to edge of bed, despite max encouragement  Ambulation/Gait             General Gait Details: pt declined   Stairs            Wheelchair Mobility    Modified Rankin (Stroke Patients Only)       Balance Overall balance assessment: History of Falls                                   Cognition Arousal/Alertness: Awake/alert Behavior During Therapy: Anxious Overall Cognitive Status: Within Functional Limits for tasks assessed                      Exercises General Exercises - Lower Extremity Ankle Circles/Pumps: AROM;Both;10 reps;Supine Quad Sets: AROM;Right;10 reps;Supine Gluteal Sets: AROM;Right;10 reps;Supine Hip ABduction/ADduction: AROM;Right;10 reps;Supine;Other (comment) (isometric abduct, isometric adduct) Straight Leg Raises: AROM;Right;10 reps;Supine;Other (comment) (isometric)    General Comments General comments (skin integrity, edema, etc.): reapplied Rt knee immobilizer in bed, trimmed excess distal portion for improved fit, min/mod sanguinous drainage from Rt lateral proximal incision site      Pertinent Vitals/Pain Pain Assessment: 0-10 Pain Score: 8  Pain Location: Rt knee/lateral thigh Pain Descriptors / Indicators: Aching;Sharp;Shooting;Tender Pain Intervention(s): Limited activity within patient's tolerance;Monitored during session;Premedicated before session;Repositioned    Home Living                      Prior Function            PT Goals (current goals can now be found in the care plan section) Acute Rehab PT Goals Patient Stated Goal: rehab; let this medicine kick in so I can get some relief! Progress towards PT goals: Not progressing toward goals - comment  Frequency       PT Plan      Co-evaluation             End of Session   Activity Tolerance: Patient limited by pain Patient left: in bed;with call bell/phone within reach;with SCD's reapplied     Time: 1610-9604 PT Time Calculation (min) (ACUTE ONLY): 51 min  Charges:  $Therapeutic Exercise: 23-37 mins $Self Care/Home Management: Oct 08, 2022                    G CodesNestor Lewandowsky, PT [phone]  SOTH,KRISTEN 11/09/2014, 11:07 AM

## 2014-11-09 NOTE — Progress Notes (Signed)
Pt for discharge to [facility]   CSW facilitated pt discharge needs including faxing pt discharge summary to [facility], discussing with pt at bedside, providing RN phone number to call report, and arranging ambulance transport for pt to [facility] scheduled for 2 pm pick up.   No further social work needs identified at this time.  CSW signing off.   Loletta Specter, MSW, LCSW Clinical Social Work [phone]

## 2014-11-09 NOTE — Plan of Care (Signed)
Problem: Acute Rehab PT Goals(only PT should resolve) Goal: Pt Will Go Supine/Side To Sit Outcome: Not Met (add Reason) pain Goal: Pt Will Go Sit To Supine/Side Outcome: Not Met (add Reason) pain Goal: Patient Will Transfer Sit To/From Stand Outcome: Not Met (add Reason) Pain, pt declined Goal: Pt Will Transfer Bed To Chair/Chair To Bed Outcome: Not Met (add Reason) Pain intensity

## 2014-11-11 ENCOUNTER — Encounter: Payer: Self-pay | Admitting: Adult Health

## 2014-11-11 ENCOUNTER — Non-Acute Institutional Stay (SKILLED_NURSING_FACILITY): Payer: Medicare Other | Admitting: Adult Health

## 2014-11-11 DIAGNOSIS — E46 Unspecified protein-calorie malnutrition: Secondary | ICD-10-CM

## 2014-11-11 DIAGNOSIS — E785 Hyperlipidemia, unspecified: Secondary | ICD-10-CM | POA: Diagnosis not present

## 2014-11-11 DIAGNOSIS — T8450XS Infection and inflammatory reaction due to unspecified internal joint prosthesis, sequela: Secondary | ICD-10-CM | POA: Diagnosis not present

## 2014-11-11 DIAGNOSIS — K5900 Constipation, unspecified: Secondary | ICD-10-CM

## 2014-11-11 DIAGNOSIS — I10 Essential (primary) hypertension: Secondary | ICD-10-CM

## 2014-11-11 DIAGNOSIS — Z96659 Presence of unspecified artificial knee joint: Principal | ICD-10-CM

## 2014-11-11 DIAGNOSIS — T8459XS Infection and inflammatory reaction due to other internal joint prosthesis, sequela: Secondary | ICD-10-CM

## 2014-11-11 DIAGNOSIS — D62 Acute posthemorrhagic anemia: Secondary | ICD-10-CM | POA: Diagnosis not present

## 2014-11-11 LAB — TYPE AND SCREEN
ABO/RH(D): A POS
Antibody Screen: NEGATIVE
UNIT DIVISION: 0

## 2014-11-12 DIAGNOSIS — M6281 Muscle weakness (generalized): Secondary | ICD-10-CM | POA: Diagnosis not present

## 2014-11-12 DIAGNOSIS — R2689 Other abnormalities of gait and mobility: Secondary | ICD-10-CM | POA: Diagnosis not present

## 2014-11-12 DIAGNOSIS — M00861 Arthritis due to other bacteria, right knee: Secondary | ICD-10-CM | POA: Diagnosis not present

## 2014-11-12 DIAGNOSIS — R5381 Other malaise: Secondary | ICD-10-CM | POA: Diagnosis not present

## 2014-11-12 DIAGNOSIS — R600 Localized edema: Secondary | ICD-10-CM | POA: Diagnosis not present

## 2014-11-13 ENCOUNTER — Non-Acute Institutional Stay (SKILLED_NURSING_FACILITY): Payer: Medicare Other | Admitting: Internal Medicine

## 2014-11-13 ENCOUNTER — Encounter (HOSPITAL_COMMUNITY): Payer: Self-pay | Admitting: Orthopedic Surgery

## 2014-11-13 DIAGNOSIS — I10 Essential (primary) hypertension: Secondary | ICD-10-CM

## 2014-11-13 DIAGNOSIS — Z96659 Presence of unspecified artificial knee joint: Secondary | ICD-10-CM

## 2014-11-13 DIAGNOSIS — T8450XA Infection and inflammatory reaction due to unspecified internal joint prosthesis, initial encounter: Secondary | ICD-10-CM

## 2014-11-13 DIAGNOSIS — R7881 Bacteremia: Secondary | ICD-10-CM | POA: Diagnosis not present

## 2014-11-13 DIAGNOSIS — M00861 Arthritis due to other bacteria, right knee: Secondary | ICD-10-CM | POA: Diagnosis not present

## 2014-11-13 DIAGNOSIS — T8459XA Infection and inflammatory reaction due to other internal joint prosthesis, initial encounter: Secondary | ICD-10-CM

## 2014-11-13 DIAGNOSIS — K5900 Constipation, unspecified: Secondary | ICD-10-CM | POA: Diagnosis not present

## 2014-11-13 DIAGNOSIS — R2689 Other abnormalities of gait and mobility: Secondary | ICD-10-CM | POA: Diagnosis not present

## 2014-11-13 DIAGNOSIS — M6281 Muscle weakness (generalized): Secondary | ICD-10-CM | POA: Diagnosis not present

## 2014-11-13 DIAGNOSIS — D62 Acute posthemorrhagic anemia: Secondary | ICD-10-CM | POA: Diagnosis not present

## 2014-11-13 DIAGNOSIS — E785 Hyperlipidemia, unspecified: Secondary | ICD-10-CM

## 2014-11-13 DIAGNOSIS — R600 Localized edema: Secondary | ICD-10-CM | POA: Diagnosis not present

## 2014-11-13 DIAGNOSIS — R5381 Other malaise: Secondary | ICD-10-CM

## 2014-11-13 NOTE — Progress Notes (Signed)
Patient ID: [name] [name], female   DOB: Oct 26, 1950, 64 y.o.   MRN: [id number]     [facility]   PCP: Lupita Raider, MD  Code Status: Full Code  No Known Allergies  Chief Complaint  Patient presents with  . New Admit To SNF    New Admit     HPI:  64 y.o. 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 She had to undergo irrigation and debridement and had antibiotic impregnated spacer placed on 11/06/14. She was started on antibiotics, seen by Infectious disease and is now on cefazolin and rifampin for a total of 6 weeks. She has medical history of bilateral knee replacements, hypertension, hyperlipidemia. She is seen in her room today. Her pain is under control with current regimen but mobility is limited. She has been working some with therapy team. Wound nurse has noted her surgical incision to have dehisced some with drainage reported. She is seen in her room today. She denies any fever or chills.   Review of Systems:  Constitutional: Negative for fever, chills, diaphoresis.  HENT: Negative for headache, congestion, nasal discharge, difficulty swallowing.   Eyes: Negative for eye pain, blurred vision, double vision and discharge.  Respiratory: Negative for cough, shortness of breath and wheezing.   Cardiovascular: Negative for chest pain, palpitations. Positive for leg swelling.  Gastrointestinal: Negative for heartburn, nausea, vomiting, abdominal pain. Last bowel movement 2 days back Genitourinary: Negative for dysuria, flank pain.  Musculoskeletal: Negative for back pain, falls Skin: Negative for itching, rash.  Neurological: Negative for dizziness, tingling, focal weakness Psychiatric/Behavioral: Negative for depression   Past Medical History  Diagnosis Date  . Hyperlipidemia   . Hypertension   . S/P right knee arthroscopy     with septic  joint growing out MSSA  . Septic arthritis of knee, right   . Overactive bladder    Past Surgical History  Procedure Laterality Date  . Joint replacement    . Knee surgery Bilateral   . Knee arthroscopy Right 12/09/2012    Procedure: ARTHROSCOPIC IRRIGATION AND DEBRIDEMENT RIGHT KNEE;  Surgeon: Budd Palmer, MD;  Location: [facility] OR;  Service: Orthopedics;  Laterality: Right;  . Total knee revision Right 11/06/2014    Procedure: REMOVAL OF TOTAL COMPONENTS, EXTENSIVE  IRRIGATION AND DEBRIDEMENT  RIGHT KNEE;  Surgeon: Loreta Ave, MD;  Location[facility] OR;  Service: Orthopedics;  Laterality: Right;  . Reimplantation of cemented spacer knee Right 11/06/2014    Procedure: PLACEMENT OF CEMENT SPACER;  Surgeon: Loreta Ave, MD;  Location: [facility] OR;  Service: Orthopedics;  Laterality: Right;   Social History:   reports that she quit smoking about 22 years ago. She has never used smokeless tobacco. She reports that she does not drink alcohol or use illicit drugs.  No family history on file.  Medications:   Medication List       This list is accurate as of: 11/13/14 10:08 AM.  Always use your most recent med list.               acetaminophen 325 MG tablet  Commonly known as:  TYLENOL  Take 2 tablets (650 mg total) by mouth every 4 (four) hours as needed for mild pain, fever or headache (or Fever >/= 101).     aspirin 81 MG tablet  Take 81 mg by mouth every morning.     atorvastatin 40 MG tablet  Commonly known as:  LIPITOR  Take 40 mg by mouth every morning.     CEFAZOLIN SODIUM IV  2g/57ml in over 30 minutes three times daily. Stop 11/18/14     docusate sodium 100 MG capsule  Commonly known as:  COLACE  Take 1 capsule (100 mg total) by mouth 2 (two) times daily.     enoxaparin 30 MG/0.3ML injection  Commonly known as:  LOVENOX  Inject 0.3 mLs (30 mg total) into the skin every 12 (twelve) hours. Use Lovenox injections as directed q12 hours x 14 days following surgery to  prevent blood clots     ferrous sulfate 325 (65 FE) MG tablet  Take 1 tablet (325 mg total) by mouth 2 (two) times daily with a meal.     morphine 15 MG 12 hr tablet  Commonly known as:  MS CONTIN  Take 1 tablet (15 mg total) by mouth every 12 (twelve) hours.     nebivolol 10 MG tablet  Commonly known as:  BYSTOLIC  Take 10 mg by mouth every morning.     oxyCODONE-acetaminophen 5-325 MG tablet  Commonly known as:  ROXICET  Take 1-2 tablets by mouth every 4 (four) hours as needed.     PROCEL PO  One Scoop twice daily for protein     rifampin 300 MG capsule  Commonly known as:  RIFADIN  Take 1 capsule (300 mg total) by mouth daily.     valsartan 160 MG tablet  Commonly known as:  DIOVAN  Take 160 mg by mouth daily.         Physical Exam: Filed Vitals:   11/13/14 1001  BP: 153/78  Pulse: 84  Temp: 98.7 F (37.1 C)  TempSrc: Oral  Resp: 20  Height: 5\' 4"  (1.626 m)  Weight: 251 lb 12.8 oz (114.216 kg)  SpO2: 98%    General- elderly female, obese, in no acute distress Head- normocephalic, atraumatic Nose- normal nasal mucosa, no maxillary or frontal sinus tenderness, no nasal discharge Throat- moist mucus membrane  Eyes- PERRLA, EOMI, no pallor, no icterus, no discharge, normal conjunctiva, normal sclera Neck- no cervical lymphadenopathy Cardiovascular- normal s1,s2, no murmurs, right leg and foot edema Respiratory- bilateral clear to auscultation, no wheeze, no rhonchi, no crackles, no use of accessory muscles Abdomen- bowel sounds present, soft, non tender Musculoskeletal- right leg ROM limited at both hip and knee, patient winces with pain with even the slightest movement, able to move all other 3 extremities and ROM in both shoulders limited Neurological- alert and oriented to person, place and time Skin- warm and dry, right knee surgical incision with staples in place, at 2 points there is gaping noted with skin denuded, bloody drainage present, peri-incision  erythema noted, right arm picc line Psychiatry- normal mood and affect    Labs reviewed: Basic Metabolic Panel:  Recent Labs  16/10/96 2245 11/08/14 0800 11/09/14 0425  NA 140 139 140  K 4.0 3.8 3.9  CL 110 109 106  CO2 22 25 26   GLUCOSE 107* 148* 110*  BUN 8 5* 5*  CREATININE 0.85 0.78 0.81  CALCIUM 9.2 8.9 8.9   Liver Function Tests:  Recent Labs  11/04/14 0507 11/05/14 1357 11/05/14 2245  AST 16 13* 17  ALT 7* 8* 8*  ALKPHOS 101 88 95  BILITOT 0.6 0.7 0.5  PROT 8.7* 7.8 8.3*  ALBUMIN 3.6 3.2* 3.2*   No results for input(s): LIPASE, AMYLASE in the last 8760 hours. No results for input(s):  AMMONIA in the last 8760 hours. CBC:  Recent Labs  11/04/14 0507  11/05/14 2245 11/08/14 0800 11/08/14 1124 11/09/14 0425  WBC 7.7  < > 6.6 7.9 8.8 7.0  NEUTROABS 4.4  --  4.1  --   --   --   HGB 11.0*  < > 11.2* 7.7* 8.0* 7.8*  HCT 36.0  < > 37.3 25.7* 26.1* 25.8*  MCV 68.3*  < > 70.2* 70.0* 70.0* 70.3*  PLT 407*  < > 379 277 345 293  < > = values in this interval not displayed. Cardiac Enzymes: No results for input(s): CKTOTAL, CKMB, CKMBINDEX, TROPONINI in the last 8760 hours. BNP: Invalid input(s): POCBNP CBG: No results for input(s): GLUCAP in the last 8760 hours.  Radiological Exams: Dg Knee 1-2 Views Right  11/04/2014   CLINICAL DATA:  Right knee drainage for 4 days. Previous right knee arthroplasty. No injury.  EXAM: RIGHT KNEE - 1-2 VIEW  COMPARISON:  09/09/2013  FINDINGS: Right total knee arthroplasty. Components appear grossly well-seated although oblique positioning of the lateral view limits evaluation of the femoral component. No evidence of acute fracture or dislocation. No significant effusion. Soft tissue swelling in the subcutaneous fat superficial to the patella may represent edema or cellulitis.  IMPRESSION: Subcutaneous soft tissue swelling. Right total knee arthroplasty appears grossly well seated. No acute fractures.   Electronically Signed    By: Burman Nieves M.D.   On: 11/04/2014 06:03   US Aspiration  11/04/2014   CLINICAL DATA:  PRIOR RIGHT KNEE ARTHROPLASTY, SEPTIC ARTHRITIS  EXAM: ULTRASOUND GUIDED NEEDLE ASPIRATE OF RIGHT KNEE JOINT  MEDICATIONS: 1% lidocaine locally  PROCEDURE: The procedure, risks, benefits, and alternatives were explained to the patient. Questions regarding the procedure were encouraged and answered. The patient understands and consents to the procedure.  The medial right knee was prepped with ChloraPrep in a sterile fashion, and a sterile drape was applied covering the operative field. A sterile gown and sterile gloves were used for the procedure. Local anesthesia was provided with 1% Lidocaine.  Previous imaging reviewed. Preliminary ultrasound performed. Moderate right knee joint effusion noted. Under sterile conditions and local anesthesia, a 19 gauge sheath needle was advanced percutaneously under direct ultrasound into the medial aspect of the right knee joint. Syringe aspiration yielded 10 cc extubated blood tinged synovial fluid. Sample sent for Gram stain and culture. Needle removed.  COMPLICATIONS: None.  FINDINGS: Imaging confirms needle placed in in the right knee joint for aspiration  IMPRESSION: Successful ultrasound right knee joint aspiration. Sample sent for lab analysis and culture.   Electronically Signed   By: Judie Petit.  Shick M.D.   On: 11/04/2014 15:52    Assessment/Plan  Physical deconditioning severely deconditioned. Will have patient work with PT/OT as tolerated to regain strength and restore function.  Fall precautions are in place.  Right prosthetic knee osteomyelitis Has antibiotic impregnated spacer and on 6 weeks course of rifaxamine and cefazolin. With dehisence of incision site will have her be seen by orthopedics, appointment made for 9 am 11/14/14. Start robaxin 500 mg bid for 5 days and then q12h prn now for muscle spasm and reassess. Continue ms contin 15 mg bid, roxicet 5-325 mg 1-2  tab q4h prn pain. To wear immobilizer to her right leg all the time and to work with therapy team as tolerated. Will have physiatry follow patient to help with pain management. Continue lovenox for dvt prophylaxis.  MSSA bacteremia Continue cefazolin and rifaxamin for total of 6 weeks  until 12/19/14, to be followed by ID and orthopedics, monitor wbc and temperature curve. Continue skin and right arm picc line care  Blood loss anemia Likely post surgery, monitor h&h, continue ferrous sulfate 325 mg bid for now  HTN Elevated bp, monitor bp reading for now, continue bystolic 10 mg daily and diovan 160 mg daily  HLD Continue lipitor for now and monitor   Goals of care: short term rehabilitation   Labs/tests ordered: cbc, bmp  Family/ staff Communication: reviewed care plan with patient and nursing supervisor    Oneal Grout, MD  [facility] [phone] (Monday-Friday 8 am - 5 pm) [phone] (afterhours)

## 2014-11-14 DIAGNOSIS — T8453XD Infection and inflammatory reaction due to internal right knee prosthesis, subsequent encounter: Secondary | ICD-10-CM | POA: Diagnosis not present

## 2014-11-14 DIAGNOSIS — R600 Localized edema: Secondary | ICD-10-CM | POA: Diagnosis not present

## 2014-11-14 DIAGNOSIS — R5381 Other malaise: Secondary | ICD-10-CM | POA: Diagnosis not present

## 2014-11-14 DIAGNOSIS — M00861 Arthritis due to other bacteria, right knee: Secondary | ICD-10-CM | POA: Diagnosis not present

## 2014-11-14 DIAGNOSIS — M6281 Muscle weakness (generalized): Secondary | ICD-10-CM | POA: Diagnosis not present

## 2014-11-14 DIAGNOSIS — R2689 Other abnormalities of gait and mobility: Secondary | ICD-10-CM | POA: Diagnosis not present

## 2014-11-15 DIAGNOSIS — E46 Unspecified protein-calorie malnutrition: Secondary | ICD-10-CM | POA: Insufficient documentation

## 2014-11-15 NOTE — Progress Notes (Signed)
Patient ID: [name] [name], female   DOB: 08-15-1950, 64 y.o.   MRN: [id number]    DATE:  11/11/14 MRN:  [id number]  BIRTHDAY: [birth year]  Facility:  Nursing Home Location:  [facility]  Nursing Home Room Number: [id number]  LEVEL OF CARE:  SNF [phone])  Contact Information    Name Relation Home Work [place] Son [phone]  [phone]   Lu [place] Daughter [phone]         Chief Complaint  Patient presents with  . Hospitalization Follow-up    Infection of  S/P prosthetic knee joint/osteomyelitis, anemia, hyperlipidemia, constipation, hypertension and protein calorie malnutrition    HISTORY OF PRESENT ILLNESS:  This is a 64 year old female who was been admitted to [facility] on 11/09/14 from [facility]. She has PMH of bilateral knee replacements, septic arthritis, hypertension and hyperlipidemia. She was having right knee pain, swelling, warmth and drainage. She had a history of having septic arthritis secondary to MSSA/bacteremia and was treated with 6 weeks of Ancef followed by 6 months of Keflex. She had right knee  irrigation and debridement and placement of an antibiotics impregnated spacer on 9/21.  She has been admitted for a short-term rehabilitation.  PAST MEDICAL HISTORY:  Past Medical History  Diagnosis Date  . Hyperlipidemia   . Hypertension   . S/P right knee arthroscopy     with septic joint growing out MSSA  . Septic arthritis of knee, right   . Overactive bladder      CURRENT MEDICATIONS: Reviewed  Patient's Medications  New Prescriptions   No medications on file  Previous Medications   ACETAMINOPHEN (TYLENOL) 325 MG TABLET    Take 2 tablets (650 mg total) by mouth every 4 (four) hours as needed for mild pain, fever or headache (or Fever >/= 101).   ASPIRIN 81 MG TABLET    Take 81 mg by mouth every morning.    ATORVASTATIN (LIPITOR) 40 MG TABLET    Take 40 mg by mouth every morning.    CEFAZOLIN SODIUM IV     2g/53ml in over 30 minutes three times daily. Stop 11/18/14   DOCUSATE SODIUM (COLACE) 100 MG CAPSULE    Take 1 capsule (100 mg total) by mouth 2 (two) times daily.   ENOXAPARIN (LOVENOX) 30 MG/0.3ML INJECTION    Inject 0.3 mLs (30 mg total) into the skin every 12 (twelve) hours. Use Lovenox injections as directed q12 hours x 14 days following surgery to prevent blood clots   FERROUS SULFATE 325 (65 FE) MG TABLET    Take 1 tablet (325 mg total) by mouth 2 (two) times daily with a meal.   MORPHINE (MS CONTIN) 15 MG 12 HR TABLET    Take 1 tablet (15 mg total) by mouth every 12 (twelve) hours.   NEBIVOLOL (BYSTOLIC) 10 MG TABLET    Take 10 mg by mouth every morning.    OXYCODONE-ACETAMINOPHEN (ROXICET) 5-325 MG PER TABLET    Take 1-2 tablets by mouth every 4 (four) hours as needed.   PROTEIN (PROCEL PO)    One Scoop twice daily for protein   RIFAMPIN (RIFADIN) 300 MG CAPSULE    Take 1 capsule (300 mg total) by mouth daily.   VALSARTAN (DIOVAN) 160 MG TABLET    Take 160 mg by mouth daily.  Modified Medications   No medications on file  Discontinued Medications   No medications on file    No Known Allergies   REVIEW OF  SYSTEMS:  GENERAL: no change in appetite, no fatigue, no weight changes, no fever, chills or weakness EYES: Denies change in vision, dry eyes, eye pain, itching or discharge EARS: Denies change in hearing, ringing in ears, or earache NOSE: Denies nasal congestion or epistaxis MOUTH and THROAT: Denies oral discomfort, gingival pain or bleeding, pain from teeth or hoarseness   RESPIRATORY: no cough, SOB, DOE, wheezing, hemoptysis CARDIAC: no chest pain, edema or palpitations GI: no abdominal pain, diarrhea, constipation, heart burn, nausea or vomiting GU: Denies dysuria, frequency, hematuria, incontinence, or discharge PSYCHIATRIC: Denies feeling of depression or anxiety. No report of hallucinations, insomnia, paranoia, or agitation   PHYSICAL EXAMINATION  GENERAL  APPEARANCE: Well nourished. In no acute distress. Obese SKIN:  Right knee with staples, with slight serosanguinous drainage  HEAD: Normal in size and contour. No evidence of trauma EYES: Lids open and close normally. No blepharitis, entropion or ectropion. PERRL. Conjunctivae are clear and sclerae are white. Lenses are without opacity EARS: Pinnae are normal. Patient hears normal voice tunes of the examiner MOUTH and THROAT: Lips are without lesions. Oral mucosa is moist and without lesions. Tongue is normal in shape, size, and color and without lesions NECK: supple, trachea midline, no neck masses, no thyroid tenderness, no thyromegaly LYMPHATICS: no LAN in the neck, no supraclavicular LAN RESPIRATORY: breathing is even & unlabored, BS CTAB  CARDIAC: RRR, no murmur,no extra heart sounds, RLE edema 2+; right upper arm SL PICC GI: abdomen soft, normal BS, no masses, no tenderness, no hepatomegaly, no splenomegaly EXTREMITIES:  Able to move 4 extremities; RLE immobilizer PSYCHIATRIC: Alert and oriented X 3. Affect and behavior are appropriate  LABS/RADIOLOGY: Labs reviewed: Basic Metabolic Panel:  Recent Labs  47/42/59 2245 11/08/14 0800 11/09/14 0425  NA 140 139 140  K 4.0 3.8 3.9  CL 110 109 106  CO2 GLUCOSE 107* 148* 110*  BUN 8 5* 5*  CREATININE 0.85 0.78 0.81  CALCIUM 9.2 8.9 8.9   Liver Function Tests:  Recent Labs  11/04/14 0507 11/05/14 1357 11/05/14 2245  AST 16 13* 17  ALT 7* 8* 8*  ALKPHOS 101 88 95  BILITOT 0.6 0.7 0.5  PROT 8.7* 7.8 8.3*  ALBUMIN 3.6 3.2* 3.2*   CBC:  Recent Labs  11/04/14 0507  11/05/14 2245 11/08/14 0800 11/08/14 1124 11/09/14 0425  WBC 7.7  < > 6.6 7.9 8.8 7.0  NEUTROABS 4.4  --  4.1  --   --   --   HGB 11.0*  < > 11.2* 7.7* 8.0* 7.8*  HCT 36.0  < > 37.3 25.7* 26.1* 25.8*  MCV 68.3*  < > 70.2* 70.0* 70.0* 70.3*  PLT 407*  < > 379 277 345 293  < > = values in this interval not displayed.   Dg Knee 1-2 Views  Right  11/04/2014   CLINICAL DATA:  Right knee drainage for 4 days. Previous right knee arthroplasty. No injury.  EXAM: RIGHT KNEE - 1-2 VIEW  COMPARISON:  09/09/2013  FINDINGS: Right total knee arthroplasty. Components appear grossly well-seated although oblique positioning of the lateral view limits evaluation of the femoral component. No evidence of acute fracture or dislocation. No significant effusion. Soft tissue swelling in the subcutaneous fat superficial to the patella may represent edema or cellulitis.  IMPRESSION: Subcutaneous soft tissue swelling. Right total knee arthroplasty appears grossly well seated. No acute fractures.   Electronically Signed   By: Burman Nieves M.D.   On: 11/04/2014 06:03  US Aspiration  11/04/2014   CLINICAL DATA:  PRIOR RIGHT KNEE ARTHROPLASTY, SEPTIC ARTHRITIS  EXAM: ULTRASOUND GUIDED NEEDLE ASPIRATE OF RIGHT KNEE JOINT  MEDICATIONS: 1% lidocaine locally  PROCEDURE: The procedure, risks, benefits, and alternatives were explained to the patient. Questions regarding the procedure were encouraged and answered. The patient understands and consents to the procedure.  The medial right knee was prepped with ChloraPrep in a sterile fashion, and a sterile drape was applied covering the operative field. A sterile gown and sterile gloves were used for the procedure. Local anesthesia was provided with 1% Lidocaine.  Previous imaging reviewed. Preliminary ultrasound performed. Moderate right knee joint effusion noted. Under sterile conditions and local anesthesia, a 19 gauge sheath needle was advanced percutaneously under direct ultrasound into the medial aspect of the right knee joint. Syringe aspiration yielded 10 cc extubated blood tinged synovial fluid. Sample sent for Gram stain and culture. Needle removed.  COMPLICATIONS: None.  FINDINGS: Imaging confirms needle placed in in the right knee joint for aspiration  IMPRESSION: Successful ultrasound right knee joint aspiration.  Sample sent for lab analysis and culture.   Electronically Signed   By: Judie Petit.  Shick M.D.   On: 11/04/2014 15:52    ASSESSMENT/PLAN:  Infection of prosthetic knee joint/osteomyelitis S/P irrigation and debridement and placement of ongoing antibiotics impregnated spacer - for rehabilitation; WBAT; continue Lovenox 30 mg subcutaneous every 12 hours 14 days; continue cefazolin and rifampin 42 days (6 weeks) with stop date 10/3; continue MS Contin 15 mg 12 hour 1 tab by mouth every 12 hours and Percocet 5/325 mg 1-2 tabs by mouth every 4 hours when necessary pain; right knee immobilizer at all times; no ROM on the right knee and no excessive ambulation; follow-up with Dr. Eulah Pont, orthopedic surgeon, in 2 weeks  Anemia, acute blood loss - hemoglobin 7.8; continue ferrous sulfate 325 mg 1 tab by mouth twice a day; check CBC  Hyperlipidemia - continue atorvastatin 40 mg 1 tab by mouth daily  Constipation - continue Colace 100 mg by mouth twice a day  Hypertension - well controlled; continue Bystolic 10 mg 1 tab by mouth every morning and Diovan 160 mg 1 tab by mouth daily; check BMP  Protein calorie malnutrition - albumin 3.2; start Procel 1 scoop twice a day     Goals of care:  Short-term rehabilitation   [facility], NP [facility] [phone]

## 2014-11-18 DIAGNOSIS — M00861 Arthritis due to other bacteria, right knee: Secondary | ICD-10-CM | POA: Diagnosis not present

## 2014-11-18 DIAGNOSIS — R2689 Other abnormalities of gait and mobility: Secondary | ICD-10-CM | POA: Diagnosis not present

## 2014-11-18 DIAGNOSIS — M6281 Muscle weakness (generalized): Secondary | ICD-10-CM | POA: Diagnosis not present

## 2014-11-18 DIAGNOSIS — R600 Localized edema: Secondary | ICD-10-CM | POA: Diagnosis not present

## 2014-11-18 DIAGNOSIS — R5381 Other malaise: Secondary | ICD-10-CM | POA: Diagnosis not present

## 2014-11-20 DIAGNOSIS — R600 Localized edema: Secondary | ICD-10-CM | POA: Diagnosis not present

## 2014-11-20 DIAGNOSIS — R2689 Other abnormalities of gait and mobility: Secondary | ICD-10-CM | POA: Diagnosis not present

## 2014-11-20 DIAGNOSIS — R5381 Other malaise: Secondary | ICD-10-CM | POA: Diagnosis not present

## 2014-11-20 DIAGNOSIS — M00861 Arthritis due to other bacteria, right knee: Secondary | ICD-10-CM | POA: Diagnosis not present

## 2014-11-20 DIAGNOSIS — M6281 Muscle weakness (generalized): Secondary | ICD-10-CM | POA: Diagnosis not present

## 2014-11-22 DIAGNOSIS — R2689 Other abnormalities of gait and mobility: Secondary | ICD-10-CM | POA: Diagnosis not present

## 2014-11-22 DIAGNOSIS — M00861 Arthritis due to other bacteria, right knee: Secondary | ICD-10-CM | POA: Diagnosis not present

## 2014-11-22 DIAGNOSIS — R5381 Other malaise: Secondary | ICD-10-CM | POA: Diagnosis not present

## 2014-11-22 DIAGNOSIS — M6281 Muscle weakness (generalized): Secondary | ICD-10-CM | POA: Diagnosis not present

## 2014-11-22 DIAGNOSIS — R600 Localized edema: Secondary | ICD-10-CM | POA: Diagnosis not present

## 2014-11-25 DIAGNOSIS — M00861 Arthritis due to other bacteria, right knee: Secondary | ICD-10-CM | POA: Diagnosis not present

## 2014-11-25 DIAGNOSIS — R600 Localized edema: Secondary | ICD-10-CM | POA: Diagnosis not present

## 2014-11-25 DIAGNOSIS — R5381 Other malaise: Secondary | ICD-10-CM | POA: Diagnosis not present

## 2014-11-25 DIAGNOSIS — M6281 Muscle weakness (generalized): Secondary | ICD-10-CM | POA: Diagnosis not present

## 2014-11-25 DIAGNOSIS — R2689 Other abnormalities of gait and mobility: Secondary | ICD-10-CM | POA: Diagnosis not present

## 2014-11-26 DIAGNOSIS — T8453XD Infection and inflammatory reaction due to internal right knee prosthesis, subsequent encounter: Secondary | ICD-10-CM | POA: Diagnosis not present

## 2014-11-27 DIAGNOSIS — R5381 Other malaise: Secondary | ICD-10-CM | POA: Diagnosis not present

## 2014-11-27 DIAGNOSIS — R2689 Other abnormalities of gait and mobility: Secondary | ICD-10-CM | POA: Diagnosis not present

## 2014-11-27 DIAGNOSIS — M00861 Arthritis due to other bacteria, right knee: Secondary | ICD-10-CM | POA: Diagnosis not present

## 2014-11-27 DIAGNOSIS — R600 Localized edema: Secondary | ICD-10-CM | POA: Diagnosis not present

## 2014-11-27 DIAGNOSIS — M6281 Muscle weakness (generalized): Secondary | ICD-10-CM | POA: Diagnosis not present

## 2014-11-29 DIAGNOSIS — R600 Localized edema: Secondary | ICD-10-CM | POA: Diagnosis not present

## 2014-11-29 DIAGNOSIS — M6281 Muscle weakness (generalized): Secondary | ICD-10-CM | POA: Diagnosis not present

## 2014-11-29 DIAGNOSIS — R2689 Other abnormalities of gait and mobility: Secondary | ICD-10-CM | POA: Diagnosis not present

## 2014-11-29 DIAGNOSIS — M00861 Arthritis due to other bacteria, right knee: Secondary | ICD-10-CM | POA: Diagnosis not present

## 2014-11-29 DIAGNOSIS — R5381 Other malaise: Secondary | ICD-10-CM | POA: Diagnosis not present

## 2014-11-29 NOTE — Op Note (Signed)
NAMMancel Parsons:  [name], [name] [name]             ACCOUNT NO.:  [id number]  MEDICAL RECORD NO.:  [id number]  LOCATION:  5N19C                        FACILITY:  [facility]  PHYSICIAN:  Loreta Aveaniel F. Murphy, M.D. DATE OF BIRTH:  September 04, 1950  DATE OF PROCEDURE:  11/06/2014 DATE OF DISCHARGE:  11/09/2014                              OPERATIVE REPORT   ADDENDUM  DESCRIPTION OF PROCEDURE:  The patient was brought to the operating room and after adequate anesthesia had been obtained, right knee was examined.  From chronic infection and scarring, her motion was 0-40 degrees.  Tourniquet applied.  Prepped and draped in usual sterile fashion.  Exsanguinated with elevation of Esmarch.  Tourniquet inflated to 350 mmHg.  Previous incision was then opened.  At the bottom of the incision, there was a large area of drainage.  This was elliptically excised and the fistulous tract coming out there was excised.  This extended into a large pocket of infectious material anteromedially that extended into the joint on the medial aspect.  After all of the extra- articular medial had been extensively debrided, I did a full arthrotomy in a standard manner.  There was cloudy purulent fluid within the knee itself.  All adhesions were cleared out.  I then sequentially removed all components.  I was able to saw the polyethylene off the patella preserving most of the bone, taking out the remaining pegs. Polyethylene removed from the tibia.  Utilizing thin osteotomes as well as oscillating saw, I then carefully removed all the remaining components.  They were not grossly loose.  Fortunately, I was able to preserve a fair amount of bone with removal of components.  Once that was complete, I did an extensive lavage of the entire knee utilizing almost 9 L of saline.  I felt that I cleaned out the knee and debrided this adequately.  I then utilized the [employer]ker antibiotic-impregnated cement spacers.  A large one on the tibia, medium on the  femur.  These were lightly cemented in place with antibiotic cement as well.  Once cement hardened, the wound was irrigated again.  Arthrotomy was then closed with Vicryl.  Vicryl and staples on the skin.  Sterile compressive dressing applied.  Tourniquet deflated and removed. Anesthesia reversed.  Brought to the recovery room.  Tolerated the surgery well.     Loreta Aveaniel F. Murphy, M.D.     DFM/MEDQ  D:  11/28/2014  T:  11/29/2014  Job:  [id number]

## 2014-12-02 DIAGNOSIS — R5381 Other malaise: Secondary | ICD-10-CM | POA: Diagnosis not present

## 2014-12-02 DIAGNOSIS — R600 Localized edema: Secondary | ICD-10-CM | POA: Diagnosis not present

## 2014-12-02 DIAGNOSIS — M6281 Muscle weakness (generalized): Secondary | ICD-10-CM | POA: Diagnosis not present

## 2014-12-02 DIAGNOSIS — R2689 Other abnormalities of gait and mobility: Secondary | ICD-10-CM | POA: Diagnosis not present

## 2014-12-02 DIAGNOSIS — M00861 Arthritis due to other bacteria, right knee: Secondary | ICD-10-CM | POA: Diagnosis not present

## 2014-12-02 LAB — FUNGUS CULTURE W SMEAR
Fungal Smear: NONE SEEN
SPECIAL REQUESTS: NORMAL

## 2014-12-11 ENCOUNTER — Encounter: Payer: Self-pay | Admitting: Infectious Diseases

## 2014-12-11 ENCOUNTER — Ambulatory Visit (INDEPENDENT_AMBULATORY_CARE_PROVIDER_SITE_OTHER): Payer: Medicare Other | Admitting: Infectious Diseases

## 2014-12-11 VITALS — BP 171/85 | HR 80 | Temp 97.7°F

## 2014-12-11 DIAGNOSIS — M869 Osteomyelitis, unspecified: Secondary | ICD-10-CM

## 2014-12-11 DIAGNOSIS — N10 Acute tubulo-interstitial nephritis: Secondary | ICD-10-CM | POA: Diagnosis not present

## 2014-12-11 MED ORDER — CEPHALEXIN 500 MG PO CAPS: 500.0000 mg | ORAL_CAPSULE | Freq: Two times a day (BID) | ORAL | Status: DC

## 2014-12-11 NOTE — Progress Notes (Signed)
   Subjective:    Patient ID: [name] [name], female    DOB: 09/21/1950, 64 y.o.   MRN: [id number]  HPI 64 y.o. F who underwent R TKR in 2012. 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 She underwent incision and drainage and parts exchange followed by 6 weeks of IV cefazolin and oral rifampin. She was then converted over to oral cephalexin along with rifampin and continued antibiotic therapy until June 2015. She was followed by my partner, Dr. Merceda Elksob Comer. She was feeling better and her sedimentation rate and C-reactive protein had normalized so antibiotics were stopped at that time. She fell on her right knee in July 2015 and suffered a traumatic hemarthrosis. She has had worsening pain since that time. She underwent a bone scan in March of this year which showed probable loosening of all components in the knee. She was scheduled for surgery in April but it was felt that her HTN needed to come under better control. She then developed increasing pain, swelling, redness and drainage from her knee. She came to the emergency department 9-19 and 10 mL of serosanguineous fluid was drained from the knee. She was adm and underwent I & D on 9-22, with removal of hardware and placement of anbx spacer. Her Cx (wound and operative) grew MSSA, she was d/c home on ancef/rif with completion date of 11-3.  Has been having some throbbing pain for lat 3 days, otherwise knee has been fine. [facility]  No problems with PIC Still needs to cane to walk, bear wt.   Review of Systems  Constitutional: Negative for fever, chills, appetite change and unexpected weight change.  Respiratory: Negative for cough and shortness of breath.   Gastrointestinal: Positive for constipation. Negative for diarrhea.  Genitourinary: Negative for difficulty urinating.  has been started on colace.  Has lost 19#.      Objective:   Physical Exam  Constitutional: She appears  well-developed and well-nourished.  HENT:  Mouth/Throat: No oropharyngeal exudate.  Eyes: EOM are normal. Pupils are equal, round, and reactive to light.  Neck: Neck supple.  Cardiovascular: Normal rate, regular rhythm and normal heart sounds.   Pulmonary/Chest: Effort normal and breath sounds normal.  Abdominal: Soft. Bowel sounds are normal. There is no tenderness.  Musculoskeletal:       Arms:      Legs: Lymphadenopathy:    She has no cervical adenopathy.      Assessment & Plan:

## 2014-12-11 NOTE — Assessment & Plan Note (Signed)
She appears to be doing ok.  Her wound is healing well, she is tolerating her anbx Will ask her SNF to send us her labs.  She can stop her rifampin at end of IV therapy.  Will plan for her to continue her anbx as written, plan for her to stay on oral anbx until she has new TKR.

## 2014-12-11 NOTE — Assessment & Plan Note (Signed)
Would re-cx if she has sx.

## 2014-12-17 DIAGNOSIS — T8453XD Infection and inflammatory reaction due to internal right knee prosthesis, subsequent encounter: Secondary | ICD-10-CM | POA: Diagnosis not present

## 2014-12-19 ENCOUNTER — Other Ambulatory Visit: Payer: Self-pay | Admitting: *Deleted

## 2014-12-19 ENCOUNTER — Encounter: Payer: Self-pay | Admitting: Adult Health

## 2014-12-19 DIAGNOSIS — M869 Osteomyelitis, unspecified: Secondary | ICD-10-CM

## 2014-12-19 MED ORDER — CEPHALEXIN 500 MG PO CAPS: 500.0000 mg | ORAL_CAPSULE | Freq: Two times a day (BID) | ORAL | Status: DC

## 2015-01-14 DIAGNOSIS — T8453XD Infection and inflammatory reaction due to internal right knee prosthesis, subsequent encounter: Secondary | ICD-10-CM | POA: Diagnosis not present

## 2015-03-13 ENCOUNTER — Ambulatory Visit: Payer: Medicare Other | Admitting: Infectious Diseases

## 2015-03-17 ENCOUNTER — Ambulatory Visit: Payer: Medicare Other | Admitting: Infectious Diseases

## 2015-04-18 ENCOUNTER — Emergency Department (HOSPITAL_COMMUNITY)
Admission: EM | Admit: 2015-04-18 | Discharge: 2015-04-18 | Disposition: A | Discharge status: Home / Self Care | Payer: Medicare Other | Attending: Emergency Medicine | Admitting: Emergency Medicine

## 2015-04-18 ENCOUNTER — Encounter (HOSPITAL_COMMUNITY): Payer: Self-pay | Admitting: Emergency Medicine

## 2015-04-18 DIAGNOSIS — Z79891 Long term (current) use of opiate analgesic: Secondary | ICD-10-CM | POA: Insufficient documentation

## 2015-04-18 DIAGNOSIS — Z9889 Other specified postprocedural states: Secondary | ICD-10-CM | POA: Insufficient documentation

## 2015-04-18 DIAGNOSIS — I10 Essential (primary) hypertension: Secondary | ICD-10-CM | POA: Insufficient documentation

## 2015-04-18 DIAGNOSIS — Z7901 Long term (current) use of anticoagulants: Secondary | ICD-10-CM | POA: Diagnosis not present

## 2015-04-18 DIAGNOSIS — Z87891 Personal history of nicotine dependence: Secondary | ICD-10-CM | POA: Diagnosis not present

## 2015-04-18 DIAGNOSIS — M25561 Pain in right knee: Secondary | ICD-10-CM | POA: Insufficient documentation

## 2015-04-18 DIAGNOSIS — G8929 Other chronic pain: Secondary | ICD-10-CM | POA: Insufficient documentation

## 2015-04-18 DIAGNOSIS — Z79899 Other long term (current) drug therapy: Secondary | ICD-10-CM | POA: Insufficient documentation

## 2015-04-18 DIAGNOSIS — Z7982 Long term (current) use of aspirin: Secondary | ICD-10-CM | POA: Insufficient documentation

## 2015-04-18 DIAGNOSIS — Z792 Long term (current) use of antibiotics: Secondary | ICD-10-CM | POA: Insufficient documentation

## 2015-04-18 DIAGNOSIS — N3281 Overactive bladder: Secondary | ICD-10-CM | POA: Insufficient documentation

## 2015-04-18 DIAGNOSIS — E785 Hyperlipidemia, unspecified: Secondary | ICD-10-CM | POA: Diagnosis not present

## 2015-04-18 DIAGNOSIS — M25569 Pain in unspecified knee: Secondary | ICD-10-CM | POA: Diagnosis not present

## 2015-04-18 MED ORDER — DIAZEPAM 5 MG PO TABS
5.0000 mg | ORAL_TABLET | Freq: Once | ORAL | Status: AC
  Administered 2015-04-18: 5 mg via ORAL
  Filled 2015-04-18: qty 1

## 2015-04-18 MED ORDER — HYDROMORPHONE HCL 1 MG/ML IJ SOLN
1.0000 mg | Freq: Once | INTRAMUSCULAR | Status: AC
  Administered 2015-04-18: 1 mg via INTRAMUSCULAR
  Filled 2015-04-18: qty 1

## 2015-04-18 NOTE — ED Notes (Signed)
Bed: [facility]ALB Expected date:  Expected time:  Means of arrival:  Comments: 

## 2015-04-18 NOTE — ED Notes (Addendum)
Pt BIB EMS from home c/o right knee pain; pt had knee replacement 2012 which later became infected; equipment from knee was removed due to infection; pt states her knee began again this past December; pt able to stand and pivot with assistance.  Pt states she was supposed to have knee surgery in February but her doctor wanted to get her BP under control first. BP in triage is 178/107.

## 2015-04-18 NOTE — ED Provider Notes (Signed)
CSN: [id number]     Arrival date & time 04/18/15  [id number] History   First MD Initiated Contact with Patient 04/18/15 [phone]     Chief Complaint  Patient presents with  . Knee Pain     (Consider location/radiation/quality/duration/timing/severity/associated sxs/prior Treatment) HPI   64yF with R knee pain. Significant past history with regards to this. 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 She underwent I&D and parts exchange followed by 6 weeks IV abx/rifampin then switched to oral cephalexin along with rifampin and continued antibiotic therapy until June 2015. She fell on her right knee in July 2015 and suffered a traumatic hemarthrosis. She has had worsening pain since that time. She underwent a bone scan in March, 2016 which showed probable loosening of components. She subsequently then developed increasing pain, swelling, redness and drainage from her knee. She came to the emergency department September, 2013 and 10 mL of serosanguineous fluid was drained from the knee. She was adm and underwent I & D on 9-22, with removal of hardware and placement of abx spacer.   Coming in today because of continued pain. Constant throb/ache. Can only stand for a few minutes at a time. No acute swelling, redness. Last saw orthopedic surgeon in December     Past Medical History  Diagnosis Date  . Hyperlipidemia   . Hypertension   . S/P right knee arthroscopy     with septic joint growing out MSSA  . Septic arthritis of knee, right (HCC)   . Overactive bladder    Past Surgical History  Procedure Laterality Date  . Joint replacement    . Knee surgery Bilateral   . Knee arthroscopy Right 12/09/2012    Procedure: ARTHROSCOPIC IRRIGATION AND DEBRIDEMENT RIGHT KNEE;  Surgeon: Budd PalmerMichael H Handy, MD;  Location: [facility] OR;  Service: Orthopedics;  Laterality: Right;  . Total knee revision Right 11/06/2014    Procedure: REMOVAL OF TOTAL COMPONENTS, EXTENSIVE   IRRIGATION AND DEBRIDEMENT  RIGHT KNEE;  Surgeon: Loreta Aveaniel F Murphy, MD;  [facility];  Service: Orthopedics;  Laterality: Right;  . Reimplantation of cemented spacer knee Right 11/06/2014    Procedure: PLACEMENT OF CEMENT SPACER;  Surgeon: Loreta Aveaniel F Murphy, MD;  [facility];  Service: Orthopedics;  Laterality: Right;   No family history on file. Social History  Substance Use Topics  . Smoking status: Former Smoker    Quit date: 08/01/1992  . Smokeless tobacco: Never Used     Comment: pt. does not smoke  . Alcohol Use: No   OB History    No data available     Review of Systems  All systems reviewed and negative, other than as noted in HPI.   Allergies  Review of patient's allergies indicates no known allergies.  Home Medications   Prior to Admission medications   Medication Sig Start Date End Date Taking? Authorizing Provider  acetaminophen (TYLENOL) 325 MG tablet Take 2 tablets (650 mg total) by mouth every 4 (four) hours as needed for mild pain, fever or headache (or Fever >/= 101). 11/08/14   Calvert CantorSaima Rizwan, MD  aspirin 81 MG tablet Take 81 mg by mouth every morning.     Historical Provider, MD  atorvastatin (LIPITOR) 40 MG tablet Take 40 mg by mouth every morning.     Historical Provider, MD  CEFAZOLIN SODIUM IV 2g/6350ml in 100ml over 30 minutes three times daily. Stop 11/18/14    Historical Provider, MD  cephALEXin (KEFLEX) 500 MG capsule  Take 1 capsule (500 mg total) by mouth 2 (two) times daily. 12/19/14   Ginnie Smart, MD  docusate sodium (COLACE) 100 MG capsule Take 1 capsule (100 mg total) by mouth 2 (two) times daily. 11/08/14   Calvert Cantor, MD  enoxaparin (LOVENOX) 30 MG/0.3ML injection Inject 0.3 mLs (30 mg total) into the skin every 12 (twelve) hours. Use Lovenox injections as directed q12 hours x 14 days following surgery to prevent blood clots Patient not taking: Reported on 12/11/2014 11/06/14   Cristie Hem, PA-C  ferrous sulfate 325 (65 FE) MG tablet Take  1 tablet (325 mg total) by mouth 2 (two) times daily with a meal. 11/08/14   Calvert Cantor, MD  morphine (MS CONTIN) 15 MG 12 hr tablet Take 1 tablet (15 mg total) by mouth every 12 (twelve) hours. 11/08/14   Calvert Cantor, MD  nebivolol (BYSTOLIC) 10 MG tablet Take 10 mg by mouth every morning.     Historical Provider, MD  oxyCODONE-acetaminophen (ROXICET) 5-325 MG per tablet Take 1-2 tablets by mouth every 4 (four) hours as needed. 11/06/14   Cristie Hem, PA-C  Protein (PROCEL PO) One Scoop twice daily for protein    Historical Provider, MD  rifampin (RIFADIN) 300 MG capsule Take 1 capsule (300 mg total) by mouth daily. 11/08/14   Calvert Cantor, MD  valsartan (DIOVAN) 160 MG tablet Take 160 mg by mouth daily.    Historical Provider, MD   BP 199/90 mmHg  Pulse 104  Temp(Src) 97.8 F (36.6 C)  Resp 18  SpO2 93% Physical Exam  Constitutional: She appears well-developed and well-nourished. No distress.  HENT:  Head: Normocephalic and atraumatic.  Eyes: Conjunctivae are normal. Right eye exhibits no discharge. Left eye exhibits no discharge.  Neck: Neck supple.  Cardiovascular: Normal rate, regular rhythm and normal heart sounds.  Exam reveals no gallop and no friction rub.   No murmur heard. Pulmonary/Chest: Effort normal and breath sounds normal. No respiratory distress.  Abdominal: Soft. She exhibits no distension. There is no tenderness.  Musculoskeletal: She exhibits no edema or tenderness.  Well healed surgical scars R knee. No effusion. No concerning skin changes. No TTP, but complains of increased pain with ROM. NVI.   Neurological: She is alert.  Skin: Skin is warm and dry.  Psychiatric: She has a normal mood and affect. Her behavior is normal. Thought content normal.  Nursing note and vitals reviewed.   ED Course  Procedures (including critical care time) Labs Review Labs Reviewed - No data to display  Imaging Review No results found. I have personally reviewed and  evaluated these images and lab results as part of my medical decision-making.   EKG Interpretation None      MDM   Final diagnoses:  Chronic knee pain, right    64yF with R knee pain. She is under the impression that she could come to the ED and have her surgery today. Explained that today different circumstances than the last time she came through the ED and was admitted. Today her exam is reassuring. No evidence of infection or neurovascular compromise on exam. I do feel that imaging or other work-up would be of much utility or acutely change management. Reassurance provided as well as need to see her orthpedist.     Raeford Razor, MD 04/21/15 [phone]

## 2015-04-18 NOTE — ED Notes (Signed)
Bed: WA11 Expected date:  Expected time:  Means of arrival:  Comments: Knee pain 

## 2015-04-18 NOTE — ED Notes (Signed)
MD at bedside. 

## 2015-05-16 ENCOUNTER — Emergency Department (HOSPITAL_COMMUNITY)
Admission: EM | Admit: 2015-05-16 | Discharge: 2015-05-16 | Disposition: A | Discharge status: Home / Self Care | Payer: Medicare Other | Attending: Emergency Medicine | Admitting: Emergency Medicine

## 2015-05-16 ENCOUNTER — Encounter (HOSPITAL_COMMUNITY): Payer: Self-pay | Admitting: Emergency Medicine

## 2015-05-16 DIAGNOSIS — Z9889 Other specified postprocedural states: Secondary | ICD-10-CM | POA: Diagnosis not present

## 2015-05-16 DIAGNOSIS — Z792 Long term (current) use of antibiotics: Secondary | ICD-10-CM | POA: Insufficient documentation

## 2015-05-16 DIAGNOSIS — Z7982 Long term (current) use of aspirin: Secondary | ICD-10-CM | POA: Diagnosis not present

## 2015-05-16 DIAGNOSIS — M25561 Pain in right knee: Secondary | ICD-10-CM | POA: Diagnosis not present

## 2015-05-16 DIAGNOSIS — E785 Hyperlipidemia, unspecified: Secondary | ICD-10-CM | POA: Diagnosis not present

## 2015-05-16 DIAGNOSIS — I10 Essential (primary) hypertension: Secondary | ICD-10-CM | POA: Insufficient documentation

## 2015-05-16 DIAGNOSIS — Z87448 Personal history of other diseases of urinary system: Secondary | ICD-10-CM | POA: Insufficient documentation

## 2015-05-16 DIAGNOSIS — M25569 Pain in unspecified knee: Secondary | ICD-10-CM | POA: Diagnosis not present

## 2015-05-16 DIAGNOSIS — G8929 Other chronic pain: Secondary | ICD-10-CM | POA: Insufficient documentation

## 2015-05-16 DIAGNOSIS — R52 Pain, unspecified: Secondary | ICD-10-CM | POA: Diagnosis not present

## 2015-05-16 DIAGNOSIS — R2241 Localized swelling, mass and lump, right lower limb: Secondary | ICD-10-CM | POA: Diagnosis not present

## 2015-05-16 DIAGNOSIS — Z79899 Other long term (current) drug therapy: Secondary | ICD-10-CM | POA: Insufficient documentation

## 2015-05-16 DIAGNOSIS — Z87891 Personal history of nicotine dependence: Secondary | ICD-10-CM | POA: Diagnosis not present

## 2015-05-16 MED ORDER — HYDROCODONE-ACETAMINOPHEN 5-325 MG PO TABS
1.0000 | ORAL_TABLET | Freq: Once | ORAL | Status: AC
Start: 2015-05-16 — End: 2015-05-16
  Administered 2015-05-16: 1 via ORAL
  Filled 2015-05-16: qty 1

## 2015-05-16 MED ORDER — TRAMADOL HCL 50 MG PO TABS: 50.0000 mg | ORAL_TABLET | Freq: Four times a day (QID) | ORAL | Status: DC | PRN

## 2015-05-16 MED ORDER — NEBIVOLOL HCL 10 MG PO TABS: 10.0000 mg | ORAL_TABLET | Freq: Every day | ORAL | Status: DC

## 2015-05-16 NOTE — ED Notes (Signed)
[facility] notified of need for pt to be transported home. PT was given discharge information and has signed discharge paperwork.

## 2015-05-16 NOTE — ED Notes (Signed)
Per EMS, chronic right knee pain

## 2015-05-16 NOTE — ED Provider Notes (Signed)
CSN: [id number]     Arrival date & time 05/16/15  1513 History   First MD Initiated Contact with Patient 05/16/15 1730     Chief Complaint  Patient presents with  . Knee Pain   HPI   65 -year-old female resents today with  R knee pain. 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 She underwent I&D and parts exchange followed by 6 weeks IV abx/rifampin then switched to oral cephalexin along with rifampin and continued antibiotic therapy until June 2015. She fell on her right knee in July 2015 and suffered a traumatic hemarthrosis. She has had worsening pain since that time. She underwent a bone scan in March, 2016 which showed probable loosening of components. She subsequently then developed increasing pain, swelling, redness and drainage from her knee. She came to the emergency department September, 2013 and 10 mL of serosanguineous fluid was drained from the knee. She was adm and underwent I & D on 9-22, with removal of hardware and placement of abx spacer.   Today patient presents today with complaints of right knee pain. She was most recently seen in the ED on 04/18/2015 for the same. Patient reports that since surgery she's had intermittent swelling and pain to the right knee. She reports this is not every day, reports about every other day her knee or to touch, with swelling and pain, she reports placing ice on the knee and reduce his symptoms back down to baseline. Patient reports the pain has been persistent, she has followed up with Dr. Eulah Pont who performed the procedure, he is evaluated the knee at its present condition in December reporting that they would need to do a total knee replacement. She was scheduled in February of this year for the surgery, but due to hypertension was unable to undergo the procedure. Today patient reports the pain is baseline, with normal skin findings of the knee. Patient denies any fever, chills, nausea, vomiting, or any  other systemic symptoms. Patient reports she continues to take Keflex daily as preventative therapy. Patient reports she ran out of her tramadol yesterday, and cannot receive another prescription for 7 days.    Past Medical History  Diagnosis Date  . Hyperlipidemia   . Hypertension   . S/P right knee arthroscopy     with septic joint growing out MSSA  . Septic arthritis of knee, right (HCC)   . Overactive bladder    Past Surgical History  Procedure Laterality Date  . Joint replacement    . Knee surgery Bilateral   . Knee arthroscopy Right 12/09/2012    Procedure: ARTHROSCOPIC IRRIGATION AND DEBRIDEMENT RIGHT KNEE;  Surgeon: Budd Palmer, MD;  Location: [facility] OR;  Service: Orthopedics;  Laterality: Right;  . Total knee revision Right 11/06/2014    Procedure: REMOVAL OF TOTAL COMPONENTS, EXTENSIVE  IRRIGATION AND DEBRIDEMENT  RIGHT KNEE;  Surgeon: Loreta Ave, MD;  Location: [facility] OR;  Service: Orthopedics;  Laterality: Right;  . Reimplantation of cemented spacer knee Right 11/06/2014    Procedure: PLACEMENT OF CEMENT SPACER;  Surgeon: Loreta Ave, MD;  Location: [facility] OR;  Service: Orthopedics;  Laterality: Right;   No family history on file. Social History  Substance Use Topics  . Smoking status: Former Smoker    Quit date: 08/01/1992  . Smokeless tobacco: Never Used     Comment: pt. does not smoke  . Alcohol Use: No   OB History    No data available  Review of Systems  All other systems reviewed and are negative.   Allergies  Review of patient's allergies indicates no known allergies.  Home Medications   Prior to Admission medications   Medication Sig Start Date End Date Taking? Authorizing Provider  aspirin 81 MG tablet Take 81 mg by mouth every morning.    Yes Historical Provider, MD  atorvastatin (LIPITOR) 40 MG tablet Take 40 mg by mouth every morning.    Yes Historical Provider, MD  cephALEXin (KEFLEX) 500 MG capsule Take 1 capsule (500 mg total) by mouth 2  (two) times daily. 12/19/14  Yes Ginnie Smart, MD  valsartan (DIOVAN) 160 MG tablet Take 160 mg by mouth daily.   Yes Historical Provider, MD  nebivolol (BYSTOLIC) 10 MG tablet Take 1 tablet (10 mg total) by mouth daily. 05/16/15   Eyvonne Mechanic, PA-C  traMADol (ULTRAM) 50 MG tablet Take 1 tablet (50 mg total) by mouth every 6 (six) hours as needed. 05/16/15   Niv Darley, PA-C   BP 167/97 mmHg  Pulse 97  Resp 18  SpO2 98%   Physical Exam  Constitutional: She is oriented to person, place, and time. She appears well-developed and well-nourished.  HENT:  Head: Normocephalic and atraumatic.  Eyes: Conjunctivae are normal. Pupils are equal, round, and reactive to light. Right eye exhibits no discharge. Left eye exhibits no discharge. No scleral icterus.  Neck: Normal range of motion. No JVD present. No tracheal deviation present.  Pulmonary/Chest: Effort normal. No stridor.  Musculoskeletal:  Warmth to touch over right knee, no redness, minor amount of swelling. No range of motion of the knee due to prior procedures, distal sensation strength, and motor function intact.  Neurological: She is alert and oriented to person, place, and time. Coordination normal.  Skin: Skin is warm and dry. No rash noted. No erythema. No pallor.  Psychiatric: She has a normal mood and affect. Her behavior is normal. Judgment and thought content normal.  Nursing note and vitals reviewed.   ED Course  Procedures (including critical care time) Labs Review Labs Reviewed - No data to display  Imaging Review No results found. I have personally reviewed and evaluated these images and lab results as part of my medical decision-making.   EKG Interpretation None      MDM   Final diagnoses:  Right knee pain    Labs:   Imaging:  Consults:  Therapeutics:  Discharge Meds:   Assessment/Plan:65 year old female presents today with right knee pain. Patient reports that this is chronic, with no  acute changes in her baseline level. She reports this gets warmth to touch, she places ice on it and it improves. She notes this is been present since the time of her last septic arthritis. She notes she's been using Ultram at home, has been unable to make contact with Dr. Eulah Pont for follow-up evaluation. I personally contacted Dr. Eulah Pont, this is not the case as he has been awaiting her return for further management. He reports that patient is in need of surgical revision, but has not been following up in the office, did call their office today for refill on pain medication. I informed Dr. Eulah Pont did patient's knee was warm to touch, reports this is baseline per patient. He instructed that no joint aspiration was indicated his on this patient was afebrile, with no signs of systemic illness. Patient is afebrile, nontoxic in no acute distress, again she reiterates that this is her baseline. Patient will be given short course  of pain medication until she can follow up with Dr. Eulah PontMurphy in his office. Patient also noted to be hypertensive here in the ED, requesting refill of her medication. Patient has been bouncing around between urgent cares getting her medications filled, informed her that she would need to follow up with primary care for continued management of her hypertension. Patient will be discharged home with follow-up information, strict return precautions. Patient verbalized understanding and agreement today's plan had no further questions or concerns at the time discharge        Eyvonne MechanicJeffrey Karisa Nesser, PA-C 05/17/15 0207  Melene Planan Floyd, DO 05/17/15 2201

## 2015-05-16 NOTE — Discharge Instructions (Signed)

## 2015-05-20 ENCOUNTER — Ambulatory Visit (INDEPENDENT_AMBULATORY_CARE_PROVIDER_SITE_OTHER): Payer: Medicare Other | Admitting: Family Medicine

## 2015-05-20 VITALS — BP 166/82 | HR 70 | Temp 97.4°F | Resp 16 | Ht 65.0 in

## 2015-05-20 DIAGNOSIS — I10 Essential (primary) hypertension: Secondary | ICD-10-CM

## 2015-05-20 DIAGNOSIS — Z9889 Other specified postprocedural states: Secondary | ICD-10-CM | POA: Diagnosis not present

## 2015-05-20 DIAGNOSIS — M25561 Pain in right knee: Secondary | ICD-10-CM | POA: Diagnosis not present

## 2015-05-20 DIAGNOSIS — T8453XD Infection and inflammatory reaction due to internal right knee prosthesis, subsequent encounter: Secondary | ICD-10-CM | POA: Diagnosis not present

## 2015-05-20 MED ORDER — AMLODIPINE BESYLATE 5 MG PO TABS: 5.0000 mg | ORAL_TABLET | Freq: Every day | ORAL | Status: DC

## 2015-05-20 NOTE — Assessment & Plan Note (Signed)
S/P R knee TKA with spacer.   Pre operative clearance today. POSSUM score of 8% mortality and 42.3% morbidity.  She does not have any severe underlying problems including CAD, arrhythmia, CKD.  Cardiac Risk Index of 0.4% of a major cardiac event per her current risk factors.  She can have surgery with the understanding that her risk factors of HTN/HLD place her heart at increased stress during the procedure.

## 2015-05-20 NOTE — Progress Notes (Signed)
Pt is a 65 y.o. female who is here for preoperative clearance for TKA revision of the R and HTN.   HTN - Not greatly controlled.  Been on Volsartan as well as bystolic and denies SE at this time.  Compliant with medication.  No Sx today including HA, blurred vision, diplopia.    TKA revision of R knee with spacer 2/2 septic arthritis - Here for pre-operative clearance per Dr. Eulah PontMurphy.  Currently wheelchair bound 2/2 her knee.  No previous MI, CHF, unstable angina or valvular Sx.   1) High Risk Cardiac Conditions  1) Recent MI - No.  2) Decompensated Heart Failure - No.  3) unstable angina - No.  4) Symptomatic arrythmia - No.  5) Sx Valvular Disease - No.  2) Intermediate Risk Factors - DM, CKD, CVA, CHF, CAD - No.  2) Functional Status - > 4 mets ( Walk, run, climb stairs ) No.  3) Surgery Specific Risk -   Intermediate (Carotid, Head and Neck, Orthopaedic )  4) Further Noninvasive evaluation -   1) EKG - Yes.    PE: Filed Vitals:   05/20/15 1512  BP: 166/82  Pulse: 70  Temp: 97.4 F (36.3 C)  Resp: 16   Physical Examination: General appearance - alert, well appearing, and in no distress Chest - clear to auscultation, no wheezes, rales or rhonchi, symmetric air entry Heart - normal rate and regular rhythm

## 2015-05-20 NOTE — Assessment & Plan Note (Signed)
Switch to Norvasc from Bystolic due to AA and no underlying CHF Continue valsartan 160 mg with possibility of increasing to 320 F/u in 3-4 weeks, can consider repeat BMET.

## 2015-05-20 NOTE — Patient Instructions (Addendum)
     IF you received an x-ray today, you will receive an invoice from [facility]. Please contact [facility] at [phone] with questions or concerns regarding your invoice.   IF you received labwork today, you will receive an invoice from [facility]. Please contact [facility] at [phone] with questions or concerns regarding your invoice.   Our billing staff will not be able to assist you with questions regarding bills from these companies.  You will be contacted with the lab results as soon as they are available. The fastest way to get your results is to activate your My Chart account. Instructions are located on the last page of this paperwork. If you have not heard from us regarding the results in 2 weeks, please contact this office.     Stop the bystolic and start the norvasc 5 mg once per day.

## 2015-05-21 ENCOUNTER — Telehealth: Payer: Self-pay | Admitting: Radiology

## 2015-05-21 MED ORDER — VALSARTAN 160 MG PO TABS: 160.0000 mg | ORAL_TABLET | Freq: Every day | ORAL | Status: DC

## 2015-05-21 NOTE — Telephone Encounter (Signed)
Patient called to clarify which meds she is to take/ she is to use, Dr Paulina FusiHess wanted her to use.  I have advised her to continue to use the Valsartan and to use the new med the Amlodipine, she has voiced understanding. She has asked for the Valsartan, only has 2 left, I have sent this in (one month supply until she can see her PCP) She has also stated she is out of Lipitor and wants this sent also, I have advised her this is not mentioned in the note, but I will send a request Please review and advise

## 2015-05-22 MED ORDER — ATORVASTATIN CALCIUM 40 MG PO TABS: 40.0000 mg | ORAL_TABLET | Freq: Every morning | ORAL | Status: DC

## 2015-05-22 NOTE — Telephone Encounter (Signed)
I have refilled for her but she will need a lipid panel before more refills or we need to see her last lab results from a different office

## 2015-05-23 ENCOUNTER — Telehealth: Payer: Self-pay

## 2015-05-23 NOTE — Telephone Encounter (Signed)
SPoke with pt, she would like an appointment for BP recheck. Can someone help. Nevermind she will just walk in.

## 2015-05-23 NOTE — Telephone Encounter (Signed)
She needs to come in

## 2015-05-23 NOTE — Telephone Encounter (Signed)
Sherry with [facility] is calling stating that patient has been cleared from a cardiac side but needs to make sure she is cleared for medical to have surgery   Best number [phone]

## 2015-05-23 NOTE — Telephone Encounter (Signed)
She saw Dr Paulina Fusihess on 4/4 and other than her BP everything was fine - her BP medications were changed and if she has seen cardiology since then and they have evaluated her BP medication change on that medication she should be fine - if they did not evaluate her BP since the medication change I would recommend her RTC for BP check.

## 2015-05-27 ENCOUNTER — Telehealth: Payer: Self-pay

## 2015-05-27 DIAGNOSIS — R32 Unspecified urinary incontinence: Secondary | ICD-10-CM

## 2015-05-27 NOTE — Telephone Encounter (Signed)
Pt states she was told to come back in order to get clearance for surgery, however she is in a wheelchair and the transportation bus isn't able to bring her since her insurance won't pay for it States she isn't sure what to do. Please call [phone]

## 2015-05-27 NOTE — Telephone Encounter (Signed)
I do not have any suggestions. Please advise.

## 2015-05-27 NOTE — Telephone Encounter (Signed)
S/P right knee arthroscopy - [name] DeutscherBryan R Hess, DO at 05/20/2015 4:06 PM     Status: Written Related Problem: S/P right knee arthroscopy   Expand All Collapse All   S/P R knee TKA with spacer.  Pre operative clearance today. POSSUM score of 8% mortality and 42.3% morbidity. She does not have any severe underlying problems including CAD, arrhythmia, CKD. Cardiac Risk Index of 0.4% of a major cardiac event per her current risk factors. She can have surgery with the understanding that her risk factors of HTN/HLD place her heart at increased stress during the procedure.          See Above. Dr. Paulina FusiHess cleared pt for surgery on 05/20/15.... Not sure what else she needs. I did see that [name] [name] recommended she return for BP recheck since her meds were changed. Does she have a machine at home? Can she check her BP and let [name] [name] know the readings? If not, there's not a whole lot I can offer, she would need to come in to have BP rechecked.

## 2015-05-28 NOTE — Telephone Encounter (Signed)
155/69 Blood pressure today 149/79

## 2015-05-29 NOTE — Telephone Encounter (Signed)
SPoke with pt, pt advised to wait for Dr. Paulina FusiHess to come in to get paper signed for surgery.  She would like to double her dose of Diovan to see if her blood pressure will go down. Is this ok?  Can we Rx pantyliner/depend? For overactive bladder. She states her Rx ran out and she needs another one.

## 2015-05-30 MED ORDER — DEPEND UNDERGARMENTS MISC: 1.0000 | Status: DC | PRN

## 2015-05-30 NOTE — Telephone Encounter (Signed)
I would have the patient increase her Norvasc as I think that will make the most changes in her BP with the current dose.  Please have her increase the Norvasc to 2 pills a day and then call us with her BP results.

## 2015-05-30 NOTE — Telephone Encounter (Signed)
Sent her Rx to the pharmacy for Depends protection.

## 2015-05-30 NOTE — Telephone Encounter (Signed)
Pt advised.

## 2015-06-02 NOTE — Telephone Encounter (Signed)
[facility]

## 2015-06-04 ENCOUNTER — Telehealth: Payer: Self-pay

## 2015-06-04 NOTE — Telephone Encounter (Signed)
Dr. Paulina FusiHess, I missed you yesterday to talk to you about this patient. She needs clearance for her surgery but you were not here last week and nobody else would sign off on this. According to your notes pt was cleared but her blood pressure was high. I need something from you, signed,  clearing her for knee surgery.

## 2015-06-04 NOTE — Telephone Encounter (Signed)
Clarisa SchoolsHey Tamara, I signed off on this when [facility] was [facility] today so they should not need anything else.  Thanks, Judie GrieveBryan

## 2015-06-04 NOTE — Telephone Encounter (Signed)
Thanks Dr. Paulina FusiHess!

## 2015-06-06 ENCOUNTER — Other Ambulatory Visit: Payer: Self-pay | Admitting: Physician Assistant

## 2015-06-06 DIAGNOSIS — T8453XD Infection and inflammatory reaction due to internal right knee prosthesis, subsequent encounter: Secondary | ICD-10-CM | POA: Diagnosis not present

## 2015-06-06 NOTE — H&P (Signed)
[name] CapriceMyrtle comes in for follow up finally.  First time I have seen her since the end of last year.  We have been waiting to get clearance to proceed with conversion of her cement spacer back to a total knee.  She has completed antibiotics.  Other than pain around her knee trying to walk with a cement spacer, she has had no elevated temperature and no local or systemic signs of infection.  She continues to have issues with her blood pressure, which has been our biggest stumbling block for getting clearance.  I had her finally come in because we have stopped refilling her pain medicine because we haven't seen her in almost four months.   History and general exam is reviewed.    EXAMINATION: Lungs clear to auscultation bilaterally.  Heart sounds normal.  Specifically, she is awake, alert and afebrile.  Her knee has little to no warmth.  Obviously limited motion 0 to about 30 degrees.  No evidence of local or systemic infection.    X-RAYS: Two view x-ray shows her spacer.  Nothing else adverse.    DISPOSITION: At this point in time I think her infection is resolved.  We are going to get a CBC with diff, repeat her SED rate and C-reactive protein.  We are going to proceed with scheduling, conversion of her back to a stemmed total knee.  Again, we need to get medical clearance before we can proceed.  Procedure, risks, benefits and complications reviewed in detail.  Her entire course reviewed in detail with her and she understands.  We will see her prior to operative intervention.    PROCEDURE: For the sake of completeness to be sure infection is eradicated, we attempted to aspirate her knee under sterile technique with Xylocaine.  This essentially yielded no fluid in her knee and I was very comfortable we were definitely in the knee joint.

## 2015-06-16 ENCOUNTER — Telehealth: Payer: Self-pay

## 2015-06-16 MED ORDER — VALSARTAN 320 MG PO TABS: 320.0000 mg | ORAL_TABLET | Freq: Every day | ORAL | Status: DC

## 2015-06-16 NOTE — Telephone Encounter (Signed)
Looks like [name] advised to double dose. Her blood pressure has been running 149/90, in that range. She would like to send in the 320mg  instead of taking two pills.

## 2015-06-19 ENCOUNTER — Other Ambulatory Visit (HOSPITAL_COMMUNITY): Payer: Self-pay | Admitting: *Deleted

## 2015-06-19 NOTE — Pre-Procedure Instructions (Signed)
    [name] HarborMyrtle [name]  06/19/2015      [facility] [place] [address] [place] [id number] Phone: [phone] Fax: [phone]    Your procedure is scheduled on 07-02-2015   Wednesday   [facility] at  11:20 A.M.   Call this number if you have problems the morning of surgery:  [phone]   Remember:  Do not eat food or drink liquids after midnight.   Take these medicines the morning of surgery with A SIP OF WATER Amlodipine(Norvasc),atorvastatin(Lipitor),tramadol(Ultra) if needed.   Do not wear jewelry, make-up or nail polish.  Do not wear lotions, powders, or perfumes.  You may not wear deodorant.  Do not shave 48 hours prior to surgery.     Do not bring valuables to the hospital.  [facility] is not responsible for any belongings or valuables.  Contacts, dentures or bridgework may not be worn into surgery.  Leave your suitcase in the car.  After surgery it may be brought to your room.  For patients admitted to the hospital, discharge time will be determined by your treatment team.    Special instructions:  See attached sheet for instructions on CHG showers  Please read over the following fact sheets that you were given. Pain Booklet, Coughing and Deep Breathing and Surgical Site Infection Prevention

## 2015-06-20 ENCOUNTER — Inpatient Hospital Stay (HOSPITAL_COMMUNITY)
Admission: RE | Admit: 2015-06-20 | Discharge: 2015-06-20 | Disposition: A | Discharge status: Home / Self Care | Payer: Medicare Other | Source: Ambulatory Visit

## 2015-06-25 ENCOUNTER — Encounter (HOSPITAL_COMMUNITY)
Admission: RE | Admit: 2015-06-25 | Discharge: 2015-06-25 | Disposition: A | Discharge status: Home / Self Care | Payer: Medicare Other | Source: Ambulatory Visit | Attending: Orthopedic Surgery | Admitting: Orthopedic Surgery

## 2015-06-25 ENCOUNTER — Encounter (HOSPITAL_COMMUNITY): Payer: Self-pay

## 2015-06-25 DIAGNOSIS — Z0183 Encounter for blood typing: Secondary | ICD-10-CM | POA: Diagnosis not present

## 2015-06-25 DIAGNOSIS — I10 Essential (primary) hypertension: Secondary | ICD-10-CM | POA: Diagnosis not present

## 2015-06-25 DIAGNOSIS — Z01812 Encounter for preprocedural laboratory examination: Secondary | ICD-10-CM | POA: Diagnosis not present

## 2015-06-25 DIAGNOSIS — Z87891 Personal history of nicotine dependence: Secondary | ICD-10-CM | POA: Diagnosis not present

## 2015-06-25 DIAGNOSIS — Z79899 Other long term (current) drug therapy: Secondary | ICD-10-CM | POA: Insufficient documentation

## 2015-06-25 DIAGNOSIS — Z01818 Encounter for other preprocedural examination: Secondary | ICD-10-CM | POA: Insufficient documentation

## 2015-06-25 DIAGNOSIS — R7303 Prediabetes: Secondary | ICD-10-CM | POA: Insufficient documentation

## 2015-06-25 DIAGNOSIS — Z7982 Long term (current) use of aspirin: Secondary | ICD-10-CM | POA: Diagnosis not present

## 2015-06-25 DIAGNOSIS — E785 Hyperlipidemia, unspecified: Secondary | ICD-10-CM | POA: Insufficient documentation

## 2015-06-25 HISTORY — DX: Presence of spectacles and contact lenses: Z97.3

## 2015-06-25 HISTORY — DX: Prediabetes: R73.03

## 2015-06-25 LAB — TYPE AND SCREEN
ABO/RH(D): A POS
Antibody Screen: NEGATIVE

## 2015-06-25 LAB — CBC
HCT: 40.3 % (ref 36.0–46.0)
Hemoglobin: 12.4 g/dL (ref 12.0–15.0)
MCH: 24.5 pg — ABNORMAL LOW (ref 26.0–34.0)
MCHC: 30.8 g/dL (ref 30.0–36.0)
MCV: 79.6 fL (ref 78.0–100.0)
PLATELETS: 267 10*3/uL (ref 150–400)
RBC: 5.06 MIL/uL (ref 3.87–5.11)
RDW: 15.7 % — AB (ref 11.5–15.5)
WBC: 5.5 10*3/uL (ref 4.0–10.5)

## 2015-06-25 LAB — BASIC METABOLIC PANEL
Anion gap: 9 (ref 5–15)
BUN: 9 mg/dL (ref 6–20)
CALCIUM: 9.7 mg/dL (ref 8.9–10.3)
CO2: 22 mmol/L (ref 22–32)
CREATININE: 0.61 mg/dL (ref 0.44–1.00)
Chloride: 110 mmol/L (ref 101–111)
GFR calc non Af Amer: >60 mL/min (ref >60)
GLUCOSE: 104 mg/dL — AB (ref 65–99)
Potassium: 4 mmol/L (ref 3.5–5.1)
Sodium: 141 mmol/L (ref 135–145)

## 2015-06-25 LAB — SURGICAL PCR SCREEN
MRSA, PCR: NEGATIVE
Staphylococcus aureus: NEGATIVE

## 2015-06-25 NOTE — Progress Notes (Signed)
Pt denies SOB, chest pain, and being under the care of a cardiologist. Pt denies having a cardiac cath and stress test. Pt denies having a chest x ray within the last year. Pt chart forwarded to anesthesia for review; clearance note on chart.

## 2015-06-25 NOTE — Pre-Procedure Instructions (Signed)
[name] HarborMyrtle [name]  06/25/2015      [facility] [place] - [address] [place] [id number] Phone: [phone] Fax: [phone]    Your procedure is scheduled on Wednesday, Jul 02, 2015.  [facility] at 11:15 A.M.  Call this number if you have problems the morning of surgery:  [phone]   Remember:  Do not eat food or drink liquids after midnight Tuesday, Jul 01, 2015  Take these medicines the morning of surgery with A SIP OF WATER : amLODipine (NORVASC), atorvastatin (LIPITOR), if needed:traMADol (ULTRAM) for pain Stop taking Aspirin, vitamins, fish oil and herbal medications. Do not take any NSAIDs ie: Ibuprofen, Advil, Naproxen, BC and Goody Powder or any medication containing Aspirin; stop now.   Do not wear jewelry, make-up or nail polish.  Do not wear lotions, powders, or perfumes.  You may not wear deodorant.  Do not shave 48 hours prior to surgery.  Men may shave face and neck.  Do not bring valuables to the hospital.  [facility] is not responsible for any belongings or valuables.  Contacts, dentures or bridgework may not be worn into surgery.  Leave your suitcase in the car.  After surgery it may be brought to your room.  For patients admitted to the hospital, discharge time will be determined by your treatment team.  Patients discharged the day of surgery will not be allowed to drive home.   Name and phone number of your driver:   Special instructions:  Special Instructions:Special Instructions: [facility] - Preparing for Surgery  Before surgery, you can play an important role.  Because skin is not sterile, your skin needs to be as free of germs as possible.  You can reduce the number of germs on you skin by washing with CHG (chlorahexidine gluconate) soap before surgery.  CHG is an antiseptic cleaner which kills germs and bonds with the skin to continue killing germs  even after washing.  Please DO NOT use if you have an allergy to CHG or antibacterial soaps.  If your skin becomes reddened/irritated stop using the CHG and inform your nurse when you arrive at Short Stay.  Do not shave (including legs and underarms) for at least 48 hours prior to the first CHG shower.  You may shave your face.  Please follow these instructions carefully:   1.  Shower with CHG Soap the night before surgery and the morning of Surgery.  2.  If you choose to wash your hair, wash your hair first as usual with your normal shampoo.  3.  After you shampoo, rinse your hair and body thoroughly to remove the Shampoo.  4.  Use CHG as you would any other liquid soap.  You can apply chg directly  to the skin and wash gently with scrungie or a clean washcloth.  5.  Apply the CHG Soap to your body ONLY FROM THE NECK DOWN.  Do not use on open wounds or open sores.  Avoid contact with your eyes, ears, mouth and genitals (private parts).  Wash genitals (private parts) with your normal soap.  6.  Wash thoroughly, paying special attention to the area where your surgery will be performed.  7.  Thoroughly rinse your body with warm water from the neck down.  8.  DO NOT shower/wash with your normal soap after using and rinsing off the CHG Soap.  9.  Pat yourself dry  with a clean towel.            10.  Wear clean pajamas.            11.  Place clean sheets on your bed the night of your first shower and do not sleep with pets.  Day of Surgery  Do not apply any lotions/deodorants the morning of surgery.  Please wear clean clothes to the hospital/surgery center.  Please read over the following fact sheets that you were given. Pain Booklet, Coughing and Deep Breathing, Blood Transfusion Information, MRSA Information and Surgical Site Infection Prevention

## 2015-06-26 LAB — HEMOGLOBIN A1C
Hgb A1c MFr Bld: 6.1 % — ABNORMAL HIGH (ref 4.8–5.6)
Mean Plasma Glucose: 128 mg/dL

## 2015-06-26 NOTE — Progress Notes (Signed)
Anesthesia Chart Review:  Pt is a 65 year old female scheduled for R total knee revision on 07/02/2015 with Dr. Jamison Neighbor. Murphy.   Pt uses [facility] for primary care; has medical clearance for surgery from Dr. Warner MccreedyBrian Hess.   PMH includes:  HTN, hyperlipidemia, borderline diabetes. Former smoker. BMI 47  Medications include: amlodipine, ASA, lipitor, valsartan.   Preoperative labs reviewed.  HgbA1c 6.1, glucose 104  EKG 11/05/14: NSR. ST & T wave abnormality, consider anterior ischemia  Echo 12/11/12:  - Procedure narrative: Transthoracic echocardiography. Image quality was poor. The study was technically difficult, as a result of poor sound wave transmission and restricted patient mobility. - Left ventricle: Wall thickness was increased in a patternof moderate LVH. Systolic function was vigorous. The estimated ejection fraction was in the range of 65% to 70%. Wall motion was normal; there were no regional wall motion abnormalities. Features are consistent with a pseudonormal left ventricular filling pattern, with concomitant abnormal relaxation and increased filling pressure (grade 2 diastolic dysfunction). Doppler parameters are consistent with high ventricular filling pressure. - Atrial septum: There was increased thickness of the septum, consistent with lipomatous hypertrophy. - Pericardium, extracardiac: A trivial pericardial effusion was identified.  If no changes, I anticipate pt can proceed with surgery as scheduled.   Rica Mastngela Airianna Kreischer, FNP-BC [facility]/Anesthesiology Phone: [phone] 06/26/2015 2:29 PM

## 2015-07-01 MED ORDER — VANCOMYCIN HCL 10 G IV SOLR
1500.0000 mg | INTRAVENOUS | Status: DC
  Filled 2015-07-01: qty 1500

## 2015-07-01 MED ORDER — TRANEXAMIC ACID 1000 MG/10ML IV SOLN
1000.0000 mg | INTRAVENOUS | Status: DC
  Filled 2015-07-01: qty 10

## 2015-07-02 ENCOUNTER — Inpatient Hospital Stay (HOSPITAL_COMMUNITY)
Admission: EM | Admit: 2015-07-02 | Discharge: 2015-07-05 | DRG: 467 | Disposition: A | Discharge status: Skilled Nursing Facility | Payer: Medicare Other | Attending: Orthopedic Surgery | Admitting: Orthopedic Surgery

## 2015-07-02 ENCOUNTER — Encounter (HOSPITAL_COMMUNITY)
Admission: EM | Disposition: A | Discharge status: Skilled Nursing Facility | Payer: Self-pay | Source: Home / Self Care | Attending: Orthopedic Surgery

## 2015-07-02 ENCOUNTER — Inpatient Hospital Stay (HOSPITAL_COMMUNITY): Payer: Medicare Other

## 2015-07-02 ENCOUNTER — Inpatient Hospital Stay (HOSPITAL_COMMUNITY): Admission: RE | Admit: 2015-07-02 | Payer: Medicare Other | Source: Ambulatory Visit | Admitting: Orthopedic Surgery

## 2015-07-02 ENCOUNTER — Emergency Department (HOSPITAL_COMMUNITY): Payer: Medicare Other | Admitting: Anesthesiology

## 2015-07-02 ENCOUNTER — Emergency Department (HOSPITAL_COMMUNITY): Payer: Medicare Other | Admitting: Emergency Medicine

## 2015-07-02 ENCOUNTER — Encounter (HOSPITAL_COMMUNITY): Payer: Self-pay | Admitting: *Deleted

## 2015-07-02 DIAGNOSIS — M00269 Other streptococcal arthritis, unspecified knee: Secondary | ICD-10-CM | POA: Diagnosis not present

## 2015-07-02 DIAGNOSIS — M25562 Pain in left knee: Secondary | ICD-10-CM | POA: Diagnosis not present

## 2015-07-02 DIAGNOSIS — Z96659 Presence of unspecified artificial knee joint: Secondary | ICD-10-CM

## 2015-07-02 DIAGNOSIS — Z6841 Body Mass Index (BMI) 40.0 and over, adult: Secondary | ICD-10-CM | POA: Diagnosis not present

## 2015-07-02 DIAGNOSIS — Z471 Aftercare following joint replacement surgery: Secondary | ICD-10-CM | POA: Diagnosis not present

## 2015-07-02 DIAGNOSIS — Z87891 Personal history of nicotine dependence: Secondary | ICD-10-CM

## 2015-07-02 DIAGNOSIS — E784 Other hyperlipidemia: Secondary | ICD-10-CM | POA: Diagnosis not present

## 2015-07-02 DIAGNOSIS — G8918 Other acute postprocedural pain: Secondary | ICD-10-CM | POA: Diagnosis not present

## 2015-07-02 DIAGNOSIS — M25512 Pain in left shoulder: Secondary | ICD-10-CM | POA: Diagnosis not present

## 2015-07-02 DIAGNOSIS — Z7982 Long term (current) use of aspirin: Secondary | ICD-10-CM | POA: Diagnosis not present

## 2015-07-02 DIAGNOSIS — M25569 Pain in unspecified knee: Secondary | ICD-10-CM | POA: Diagnosis not present

## 2015-07-02 DIAGNOSIS — M1711 Unilateral primary osteoarthritis, right knee: Secondary | ICD-10-CM | POA: Diagnosis present

## 2015-07-02 DIAGNOSIS — E785 Hyperlipidemia, unspecified: Secondary | ICD-10-CM | POA: Diagnosis present

## 2015-07-02 DIAGNOSIS — I10 Essential (primary) hypertension: Secondary | ICD-10-CM | POA: Diagnosis present

## 2015-07-02 DIAGNOSIS — M6281 Muscle weakness (generalized): Secondary | ICD-10-CM | POA: Diagnosis not present

## 2015-07-02 DIAGNOSIS — M25561 Pain in right knee: Secondary | ICD-10-CM

## 2015-07-02 DIAGNOSIS — T84498A Other mechanical complication of other internal orthopedic devices, implants and grafts, initial encounter: Secondary | ICD-10-CM | POA: Diagnosis not present

## 2015-07-02 DIAGNOSIS — Z96651 Presence of right artificial knee joint: Secondary | ICD-10-CM | POA: Diagnosis not present

## 2015-07-02 DIAGNOSIS — R278 Other lack of coordination: Secondary | ICD-10-CM | POA: Diagnosis not present

## 2015-07-02 DIAGNOSIS — D62 Acute posthemorrhagic anemia: Secondary | ICD-10-CM | POA: Diagnosis not present

## 2015-07-02 DIAGNOSIS — Z4789 Encounter for other orthopedic aftercare: Secondary | ICD-10-CM | POA: Diagnosis not present

## 2015-07-02 HISTORY — DX: Unspecified osteoarthritis, unspecified site: M19.90

## 2015-07-02 HISTORY — PX: TOTAL KNEE REVISION: SHX996

## 2015-07-02 SURGERY — TOTAL KNEE REVISION
Anesthesia: Regional | Laterality: Right

## 2015-07-02 MED ORDER — PHENOL 1.4 % MT LIQD: 1.0000 | OROMUCOSAL | Status: DC | PRN

## 2015-07-02 MED ORDER — TOBRAMYCIN SULFATE 1.2 G IJ SOLR
INTRAMUSCULAR | Status: AC
  Filled 2015-07-02: qty 2.4

## 2015-07-02 MED ORDER — METHOCARBAMOL 1000 MG/10ML IJ SOLN
500.0000 mg | Freq: Four times a day (QID) | INTRAVENOUS | Status: DC | PRN
  Filled 2015-07-02 (×2): qty 5

## 2015-07-02 MED ORDER — ROCURONIUM BROMIDE 50 MG/5ML IV SOLN
INTRAVENOUS | Status: AC
  Filled 2015-07-02: qty 1

## 2015-07-02 MED ORDER — GLYCOPYRROLATE 0.2 MG/ML IJ SOLN
INTRAMUSCULAR | Status: DC | PRN
  Administered 2015-07-02: 0.2 mg via INTRAVENOUS

## 2015-07-02 MED ORDER — LIDOCAINE HCL (CARDIAC) 20 MG/ML IV SOLN
INTRAVENOUS | Status: DC | PRN
  Administered 2015-07-02: 70 mg via INTRAVENOUS

## 2015-07-02 MED ORDER — OXYCODONE HCL 5 MG PO TABS
5.0000 mg | ORAL_TABLET | ORAL | Status: DC | PRN
  Administered 2015-07-02 – 2015-07-05 (×18): 10 mg via ORAL
  Filled 2015-07-02 (×18): qty 2

## 2015-07-02 MED ORDER — HYDROMORPHONE HCL 1 MG/ML IJ SOLN
INTRAMUSCULAR | Status: AC
  Administered 2015-07-02: 0.5 mg via INTRAVENOUS
  Filled 2015-07-02: qty 1

## 2015-07-02 MED ORDER — ATORVASTATIN CALCIUM 40 MG PO TABS
40.0000 mg | ORAL_TABLET | Freq: Every morning | ORAL | Status: DC
  Administered 2015-07-03 – 2015-07-05 (×3): 40 mg via ORAL
  Filled 2015-07-02 (×2): qty 1

## 2015-07-02 MED ORDER — ONDANSETRON HCL 4 MG PO TABS: 4.0000 mg | ORAL_TABLET | Freq: Four times a day (QID) | ORAL | Status: DC | PRN

## 2015-07-02 MED ORDER — OXYCODONE-ACETAMINOPHEN 5-325 MG PO TABS: 1.0000 | ORAL_TABLET | ORAL | Status: DC | PRN

## 2015-07-02 MED ORDER — CHLORHEXIDINE GLUCONATE 4 % EX LIQD: 60.0000 mL | Freq: Once | CUTANEOUS | Status: DC

## 2015-07-02 MED ORDER — ROPIVACAINE HCL 5 MG/ML IJ SOLN
INTRAMUSCULAR | Status: DC | PRN
  Administered 2015-07-02: 25 mL via PERINEURAL

## 2015-07-02 MED ORDER — ONDANSETRON HCL 4 MG/2ML IJ SOLN
INTRAMUSCULAR | Status: DC | PRN
  Administered 2015-07-02: 4 mg via INTRAVENOUS

## 2015-07-02 MED ORDER — PHENYLEPHRINE 40 MCG/ML (10ML) SYRINGE FOR IV PUSH (FOR BLOOD PRESSURE SUPPORT)
PREFILLED_SYRINGE | INTRAVENOUS | Status: AC
  Filled 2015-07-02: qty 10

## 2015-07-02 MED ORDER — DIPHENHYDRAMINE HCL 12.5 MG/5ML PO ELIX: 12.5000 mg | ORAL_SOLUTION | ORAL | Status: DC | PRN

## 2015-07-02 MED ORDER — FENTANYL CITRATE (PF) 250 MCG/5ML IJ SOLN
INTRAMUSCULAR | Status: AC
  Filled 2015-07-02: qty 5

## 2015-07-02 MED ORDER — TOBRAMYCIN SULFATE 1.2 G IJ SOLR
INTRAMUSCULAR | Status: DC | PRN
  Administered 2015-07-02: 2.4 g

## 2015-07-02 MED ORDER — KETOROLAC TROMETHAMINE 30 MG/ML IJ SOLN: 30.0000 mg | Freq: Once | INTRAMUSCULAR | Status: DC | PRN

## 2015-07-02 MED ORDER — POLYETHYLENE GLYCOL 3350 17 G PO PACK: 17.0000 g | PACK | Freq: Every day | ORAL | Status: DC | PRN

## 2015-07-02 MED ORDER — HYDROMORPHONE HCL 1 MG/ML IJ SOLN
0.5000 mg | INTRAMUSCULAR | Status: DC | PRN
  Administered 2015-07-03 – 2015-07-04 (×2): 1 mg via INTRAVENOUS
  Filled 2015-07-02 (×2): qty 1

## 2015-07-02 MED ORDER — HYDROCODONE-ACETAMINOPHEN 7.5-325 MG PO TABS: 1.0000 | ORAL_TABLET | Freq: Once | ORAL | Status: DC | PRN

## 2015-07-02 MED ORDER — VANCOMYCIN HCL IN DEXTROSE 1-5 GM/200ML-% IV SOLN
1000.0000 mg | Freq: Two times a day (BID) | INTRAVENOUS | Status: AC
Start: 2015-07-02 — End: 2015-07-02
  Administered 2015-07-02: 1000 mg via INTRAVENOUS
  Filled 2015-07-02: qty 200

## 2015-07-02 MED ORDER — POTASSIUM CHLORIDE IN NACL 20-0.9 MEQ/L-% IV SOLN
INTRAVENOUS | Status: DC
  Administered 2015-07-02: 17:00:00 via INTRAVENOUS
  Administered 2015-07-03: 100 mL/h via INTRAVENOUS
  Filled 2015-07-02 (×2): qty 1000

## 2015-07-02 MED ORDER — MENTHOL 3 MG MT LOZG: 1.0000 | LOZENGE | OROMUCOSAL | Status: DC | PRN

## 2015-07-02 MED ORDER — MAGNESIUM CITRATE PO SOLN: 1.0000 | Freq: Once | ORAL | Status: DC | PRN

## 2015-07-02 MED ORDER — METHOCARBAMOL 500 MG PO TABS
500.0000 mg | ORAL_TABLET | Freq: Four times a day (QID) | ORAL | Status: DC | PRN
  Administered 2015-07-03 – 2015-07-05 (×6): 500 mg via ORAL
  Filled 2015-07-02 (×8): qty 1

## 2015-07-02 MED ORDER — LABETALOL HCL 5 MG/ML IV SOLN
INTRAVENOUS | Status: DC | PRN
  Administered 2015-07-02: 5 mg via INTRAVENOUS

## 2015-07-02 MED ORDER — APIXABAN 2.5 MG PO TABS: ORAL_TABLET | ORAL | Status: DC

## 2015-07-02 MED ORDER — METOCLOPRAMIDE HCL 5 MG PO TABS
5.0000 mg | ORAL_TABLET | Freq: Three times a day (TID) | ORAL | Status: DC | PRN
Start: 2015-07-02 — End: 2015-07-05

## 2015-07-02 MED ORDER — DEPEND UNDERGARMENTS MISC: 1.0000 | Status: DC | PRN

## 2015-07-02 MED ORDER — HYDROMORPHONE HCL 1 MG/ML IJ SOLN
0.2500 mg | INTRAMUSCULAR | Status: DC | PRN
  Administered 2015-07-02 (×2): 0.5 mg via INTRAVENOUS

## 2015-07-02 MED ORDER — FENTANYL CITRATE (PF) 100 MCG/2ML IJ SOLN
INTRAMUSCULAR | Status: DC | PRN
  Administered 2015-07-02 (×2): 100 ug via INTRAVENOUS
  Administered 2015-07-02: 150 ug via INTRAVENOUS
  Administered 2015-07-02 (×2): 50 ug via INTRAVENOUS
  Administered 2015-07-02: 100 ug via INTRAVENOUS
  Administered 2015-07-02 (×3): 50 ug via INTRAVENOUS

## 2015-07-02 MED ORDER — VANCOMYCIN HCL 1000 MG IV SOLR
1500.0000 mg | INTRAVENOUS | Status: DC | PRN
  Administered 2015-07-02: 1500 mg via INTRAVENOUS

## 2015-07-02 MED ORDER — KETOROLAC TROMETHAMINE 30 MG/ML IJ SOLN
INTRAMUSCULAR | Status: AC
  Filled 2015-07-02: qty 1

## 2015-07-02 MED ORDER — LABETALOL HCL 5 MG/ML IV SOLN
INTRAVENOUS | Status: AC
  Filled 2015-07-02: qty 4

## 2015-07-02 MED ORDER — ACETAMINOPHEN 325 MG PO TABS
650.0000 mg | ORAL_TABLET | Freq: Four times a day (QID) | ORAL | Status: DC | PRN
  Filled 2015-07-02: qty 2

## 2015-07-02 MED ORDER — IRBESARTAN 300 MG PO TABS
300.0000 mg | ORAL_TABLET | Freq: Every day | ORAL | Status: DC
  Administered 2015-07-03 – 2015-07-05 (×3): 300 mg via ORAL
  Filled 2015-07-02 (×3): qty 1

## 2015-07-02 MED ORDER — ZOLPIDEM TARTRATE 5 MG PO TABS: 5.0000 mg | ORAL_TABLET | Freq: Every evening | ORAL | Status: DC | PRN

## 2015-07-02 MED ORDER — ACETAMINOPHEN 650 MG RE SUPP: 650.0000 mg | Freq: Four times a day (QID) | RECTAL | Status: DC | PRN

## 2015-07-02 MED ORDER — PROPOFOL 10 MG/ML IV BOLUS
INTRAVENOUS | Status: AC
  Filled 2015-07-02: qty 20

## 2015-07-02 MED ORDER — AMLODIPINE BESYLATE 5 MG PO TABS
5.0000 mg | ORAL_TABLET | Freq: Every day | ORAL | Status: DC
  Administered 2015-07-02 – 2015-07-05 (×4): 5 mg via ORAL
  Filled 2015-07-02 (×4): qty 1

## 2015-07-02 MED ORDER — SODIUM CHLORIDE 0.9 % IR SOLN
Status: DC | PRN
  Administered 2015-07-02: 3000 mL

## 2015-07-02 MED ORDER — MIDAZOLAM HCL 2 MG/2ML IJ SOLN
INTRAMUSCULAR | Status: AC
  Filled 2015-07-02: qty 2

## 2015-07-02 MED ORDER — ROCURONIUM BROMIDE 100 MG/10ML IV SOLN
INTRAVENOUS | Status: DC | PRN
  Administered 2015-07-02: 20 mg via INTRAVENOUS
  Administered 2015-07-02: 50 mg via INTRAVENOUS

## 2015-07-02 MED ORDER — NEOSTIGMINE METHYLSULFATE 10 MG/10ML IV SOLN
INTRAVENOUS | Status: DC | PRN
  Administered 2015-07-02: 2 mg via INTRAVENOUS

## 2015-07-02 MED ORDER — METOCLOPRAMIDE HCL 5 MG/ML IJ SOLN
5.0000 mg | Freq: Three times a day (TID) | INTRAMUSCULAR | Status: DC | PRN
Start: 2015-07-02 — End: 2015-07-05

## 2015-07-02 MED ORDER — BISACODYL 5 MG PO TBEC: 5.0000 mg | DELAYED_RELEASE_TABLET | Freq: Every day | ORAL | Status: DC | PRN

## 2015-07-02 MED ORDER — APIXABAN 2.5 MG PO TABS
2.5000 mg | ORAL_TABLET | Freq: Two times a day (BID) | ORAL | Status: DC
  Administered 2015-07-03 – 2015-07-05 (×5): 2.5 mg via ORAL
  Filled 2015-07-02 (×5): qty 1

## 2015-07-02 MED ORDER — TRANEXAMIC ACID 1000 MG/10ML IV SOLN
1000.0000 mg | INTRAVENOUS | Status: DC | PRN
  Administered 2015-07-02: 1000 mg via INTRAVENOUS

## 2015-07-02 MED ORDER — ONDANSETRON HCL 4 MG/2ML IJ SOLN
INTRAMUSCULAR | Status: AC
  Filled 2015-07-02: qty 2

## 2015-07-02 MED ORDER — MIDAZOLAM HCL 2 MG/2ML IJ SOLN
2.0000 mg | Freq: Once | INTRAMUSCULAR | Status: AC
  Administered 2015-07-02: 2 mg via INTRAVENOUS

## 2015-07-02 MED ORDER — LACTATED RINGERS IV SOLN
INTRAVENOUS | Status: DC
  Administered 2015-07-02 (×2): via INTRAVENOUS

## 2015-07-02 MED ORDER — DEXAMETHASONE SODIUM PHOSPHATE 10 MG/ML IJ SOLN
INTRAMUSCULAR | Status: AC
  Filled 2015-07-02: qty 1

## 2015-07-02 MED ORDER — BUPIVACAINE LIPOSOME 1.3 % IJ SUSP
INTRAMUSCULAR | Status: DC | PRN
  Administered 2015-07-02: 20 mL

## 2015-07-02 MED ORDER — PHENYLEPHRINE 40 MCG/ML (10ML) SYRINGE FOR IV PUSH (FOR BLOOD PRESSURE SUPPORT)
PREFILLED_SYRINGE | INTRAVENOUS | Status: DC | PRN
  Administered 2015-07-02 (×2): 80 ug via INTRAVENOUS

## 2015-07-02 MED ORDER — DEXAMETHASONE SODIUM PHOSPHATE 10 MG/ML IJ SOLN
10.0000 mg | Freq: Once | INTRAMUSCULAR | Status: AC
  Administered 2015-07-03: 10 mg via INTRAVENOUS
  Filled 2015-07-02: qty 1

## 2015-07-02 MED ORDER — METHOCARBAMOL 500 MG PO TABS: 500.0000 mg | ORAL_TABLET | Freq: Four times a day (QID) | ORAL | Status: DC

## 2015-07-02 MED ORDER — ONDANSETRON HCL 4 MG PO TABS: 4.0000 mg | ORAL_TABLET | Freq: Three times a day (TID) | ORAL | Status: DC | PRN

## 2015-07-02 MED ORDER — PROPOFOL 10 MG/ML IV BOLUS
INTRAVENOUS | Status: DC | PRN
  Administered 2015-07-02: 170 mg via INTRAVENOUS

## 2015-07-02 MED ORDER — ONDANSETRON HCL 4 MG/2ML IJ SOLN: 4.0000 mg | Freq: Four times a day (QID) | INTRAMUSCULAR | Status: DC | PRN

## 2015-07-02 MED ORDER — BUPIVACAINE LIPOSOME 1.3 % IJ SUSP
20.0000 mL | INTRAMUSCULAR | Status: DC
  Filled 2015-07-02: qty 20

## 2015-07-02 MED ORDER — SODIUM CHLORIDE 0.9 % IJ SOLN
INTRAMUSCULAR | Status: DC | PRN
  Administered 2015-07-02: 40 mL via INTRAVENOUS

## 2015-07-02 MED ORDER — FENTANYL CITRATE (PF) 100 MCG/2ML IJ SOLN
100.0000 ug | Freq: Once | INTRAMUSCULAR | Status: AC
  Administered 2015-07-02: 100 ug via INTRAVENOUS

## 2015-07-02 MED ORDER — DOCUSATE SODIUM 100 MG PO CAPS
100.0000 mg | ORAL_CAPSULE | Freq: Two times a day (BID) | ORAL | Status: DC
  Administered 2015-07-02 – 2015-07-05 (×7): 100 mg via ORAL
  Filled 2015-07-02 (×7): qty 1

## 2015-07-02 MED ORDER — CELECOXIB 200 MG PO CAPS
200.0000 mg | ORAL_CAPSULE | Freq: Two times a day (BID) | ORAL | Status: DC
  Administered 2015-07-02 – 2015-07-05 (×7): 200 mg via ORAL
  Filled 2015-07-02 (×7): qty 1

## 2015-07-02 SURGICAL SUPPLY — 77 items
ANCHOR CORKSCREW BIO 5.5 FT (Anchor) ×3 IMPLANT
BANDAGE ACE 6X5 VEL STRL LF (GAUZE/BANDAGES/DRESSINGS) ×3 IMPLANT
BANDAGE ELASTIC 4 VELCRO ST LF (GAUZE/BANDAGES/DRESSINGS) ×3 IMPLANT
BANDAGE ELASTIC 6 VELCRO ST LF (GAUZE/BANDAGES/DRESSINGS) ×3 IMPLANT
BANDAGE ESMARK 6X9 LF (GAUZE/BANDAGES/DRESSINGS) ×1 IMPLANT
BASEPLATE TIBIAL SZ4 (Plate) ×3 IMPLANT
BASEPLATE TIBIAL TRIATH (Orthopedic Implant) ×2 IMPLANT
BLADE SAG 18X100X1.27 (BLADE) ×3 IMPLANT
BLADE SAW SGTL 13.0X1.19X90.0M (BLADE) ×3 IMPLANT
BLADE SURG ROTATE 9660 (MISCELLANEOUS) IMPLANT
BNDG ESMARK 6X9 LF (GAUZE/BANDAGES/DRESSINGS) ×3
BOWL SMART MIX CTS (DISPOSABLE) IMPLANT
CEMENT BONE SIMPLEX SPEEDSET (Cement) ×6 IMPLANT
CLOSURE WOUND 1/2 X4 (GAUZE/BANDAGES/DRESSINGS) ×1
COVER SURGICAL LIGHT HANDLE (MISCELLANEOUS) ×3 IMPLANT
CUFF TOURNIQUET SINGLE 44IN (TOURNIQUET CUFF) ×3 IMPLANT
DRAPE EXTREMITY T 121X128X90 (DRAPE) ×3 IMPLANT
DRAPE IMP U-DRAPE 54X76 (DRAPES) ×3 IMPLANT
DRAPE U-SHAPE 47X51 STRL (DRAPES) ×3 IMPLANT
DRSG ADAPTIC 3X8 NADH LF (GAUZE/BANDAGES/DRESSINGS) ×3 IMPLANT
DRSG PAD ABDOMINAL 8X10 ST (GAUZE/BANDAGES/DRESSINGS) ×3 IMPLANT
DURAPREP 26ML APPLICATOR (WOUND CARE) ×6 IMPLANT
ELECT CAUTERY BLADE 6.4 (BLADE) ×3 IMPLANT
ELECT REM PT RETURN 9FT ADLT (ELECTROSURGICAL) ×3
ELECTRODE REM PT RTRN 9FT ADLT (ELECTROSURGICAL) ×1 IMPLANT
EVACUATOR 1/8 PVC DRAIN (DRAIN) IMPLANT
FACESHIELD WRAPAROUND (MASK) ×3 IMPLANT
FEMORAL CAMP STABILIZER KNEE (Knees) ×3 IMPLANT
GAUZE SPONGE 4X4 12PLY STRL (GAUZE/BANDAGES/DRESSINGS) ×3 IMPLANT
GAUZE XEROFORM 1X8 LF (GAUZE/BANDAGES/DRESSINGS) ×6 IMPLANT
GLOVE BIOGEL PI IND STRL 7.0 (GLOVE) ×1 IMPLANT
GLOVE BIOGEL PI INDICATOR 7.0 (GLOVE) ×2
GLOVE ECLIPSE 7.0 STRL STRAW (GLOVE) ×3 IMPLANT
GLOVE ORTHO TXT STRL SZ7.5 (GLOVE) ×3 IMPLANT
GOWN STRL REUS W/ TWL LRG LVL3 (GOWN DISPOSABLE) ×3 IMPLANT
GOWN STRL REUS W/ TWL XL LVL3 (GOWN DISPOSABLE) ×1 IMPLANT
GOWN STRL REUS W/TWL LRG LVL3 (GOWN DISPOSABLE) ×6
GOWN STRL REUS W/TWL XL LVL3 (GOWN DISPOSABLE) ×2
HANDPIECE INTERPULSE COAX TIP (DISPOSABLE)
IMMOBILIZER KNEE 22 UNIV (SOFTGOODS) ×3 IMPLANT
INSERT TIBIAL SZ 4 11MM (Insert) ×1 IMPLANT
KIT BASIN OR (CUSTOM PROCEDURE TRAY) ×3 IMPLANT
KIT ROOM TURNOVER OR (KITS) ×3 IMPLANT
KNEE-FLUTED STEM 14MM*100MM TS (Stem) ×3 IMPLANT
MANIFOLD NEPTUNE II (INSTRUMENTS) ×3 IMPLANT
NEEDLE 18GX1X1/2 (RX/OR ONLY) (NEEDLE) ×3 IMPLANT
NEEDLE 22X1 1/2 (OR ONLY) (NEEDLE) ×3 IMPLANT
NEEDLE SUT PASSER RTC (NEEDLE) ×3 IMPLANT
NS IRRIG 1000ML POUR BTL (IV SOLUTION) ×3 IMPLANT
PACK TOTAL JOINT (CUSTOM PROCEDURE TRAY) ×3 IMPLANT
PACK UNIVERSAL I (CUSTOM PROCEDURE TRAY) ×3 IMPLANT
PAD ARMBOARD 7.5X6 YLW CONV (MISCELLANEOUS) ×6 IMPLANT
PAD CAST 4YDX4 CTTN HI CHSV (CAST SUPPLIES) ×1 IMPLANT
PADDING CAST COTTON 4X4 STRL (CAST SUPPLIES) ×2
PADDING CAST COTTON 6X4 STRL (CAST SUPPLIES) ×6 IMPLANT
PATELLA ASYMMETRIC 35X10MM (Knees) ×3 IMPLANT
SET HNDPC FAN SPRY TIP SCT (DISPOSABLE) IMPLANT
STAPLER VISISTAT 35W (STAPLE) ×3 IMPLANT
STEM FLUTED TRIA 13X100 (Stem) ×3 IMPLANT
STEM KNEE-FLUTED 14MM*100MM TS (Stem) ×1 IMPLANT
STRIP CLOSURE SKIN 1/2X4 (GAUZE/BANDAGES/DRESSINGS) ×2 IMPLANT
SUCTION FRAZIER HANDLE 10FR (MISCELLANEOUS) ×2
SUCTION TUBE FRAZIER 10FR DISP (MISCELLANEOUS) ×1 IMPLANT
SUT MNCRL AB 4-0 PS2 18 (SUTURE) ×3 IMPLANT
SUT VIC AB 0 CT1 27 (SUTURE)
SUT VIC AB 0 CT1 27XBRD ANBCTR (SUTURE) IMPLANT
SUT VIC AB 1 CTX 36 (SUTURE) ×2
SUT VIC AB 1 CTX36XBRD ANBCTR (SUTURE) ×1 IMPLANT
SUT VIC AB 2-0 CT1 27 (SUTURE) ×6
SUT VIC AB 2-0 CT1 TAPERPNT 27 (SUTURE) ×3 IMPLANT
SYR 50ML LL SCALE MARK (SYRINGE) ×3 IMPLANT
TIBIAL INSERT SZ 4 11MM (Insert) ×3 IMPLANT
TOWEL OR 17X24 6PK STRL BLUE (TOWEL DISPOSABLE) ×3 IMPLANT
TOWEL OR 17X26 10 PK STRL BLUE (TOWEL DISPOSABLE) ×3 IMPLANT
TRAY FOLEY CATH 16FRSI W/METER (SET/KITS/TRAYS/PACK) IMPLANT
TUBE ANAEROBIC SPECIMEN COL (MISCELLANEOUS) IMPLANT
WATER STERILE IRR 1000ML POUR (IV SOLUTION) ×3 IMPLANT

## 2015-07-02 NOTE — ED Notes (Signed)
Pt called for [facility] transport to hospital for knee replacement by Dr. Eulah PontMurphy. Pt was unable to ambulate to get out of the house. OR to be called and alerted pt is here.

## 2015-07-02 NOTE — Progress Notes (Signed)
Orthopedic Tech Progress Note Patient Details:  [name] [name] 08/17/1950 [id number]  CPM Right Knee CPM Right Knee: On Right Knee Flexion (Degrees): 60 Right Knee Extension (Degrees): 0 Additional Comments: trapezebar patient helper Viewed order from doctor's order list  Nikki DomCrawford, Berline Semrad 07/02/2015, 3:03 PM

## 2015-07-02 NOTE — Discharge Summary (Addendum)
Patient ID: [name] HarborMyrtle Lorenzo MRN: [id number] DOB/AGE: 1950/12/13 65 y.o.  Admit date: 07/02/2015 Discharge date: 07/04/2015  Admission Diagnoses:  Active Problems:   S/P revision of total knee   Discharge Diagnoses:  Same  Past Medical History  Diagnosis Date  . Hyperlipidemia   . Hypertension   . S/P right knee arthroscopy     with septic joint growing out MSSA  . Overactive bladder   . Wears glasses   . Borderline diabetes   . Septic arthritis of knee, right (HCC)   . Arthritis     "right knee" (07/02/2015)    Surgeries: Procedure(s): TOTAL KNEE REVISION on 07/02/2015   Consultants:    Discharged Condition: Improved  Hospital Course: [name] [name] is an 65 y.o. 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 Patient has severe unremitting pain that affects sleep, daily activities, and work/hobbies. After pre-op clearance the patient was taken to the operating room on 07/02/2015 and underwent  Procedure(s): TOTAL KNEE REVISION.  Patient was given perioperative antibiotics: patient with a pre-op Hb of 12.4 developed ABLA on pod#1 with a Hb of 9.1.  She is currently stable but we will continue to follow.      Anti-infectives    Start     Dose/Rate Route Frequency Ordered Stop   07/02/15 2100  vancomycin (VANCOCIN) IVPB 1000 mg/200 mL premix     1,000 mg 200 mL/hr over 60 Minutes Intravenous Every 12 hours 07/02/15 1524 07/02/15 2225   07/02/15 1203  tobramycin (NEBCIN) powder  Status:  Discontinued       As needed 07/02/15 1203 07/02/15 1337       Patient was given sequential compression devices, early ambulation, and chemoprophylaxis to prevent DVT.  Patient benefited maximally from hospital stay and there were no complications.    Recent vital signs:  Patient Vitals for the past 24 hrs:  BP Temp Temp src Pulse Resp SpO2  07/04/15 0514 (!) 117/48 mmHg 98.1 F (36.7 C) Oral (!) 103 18 100 %  07/03/15 2027 (!) 123/53  mmHg 98.2 F (36.8 C) Oral (!) 110 18 99 %  07/03/15 1700 (!) 150/63 mmHg 98.8 F (37.1 C) Oral (!) 120 18 100 %     Recent laboratory studies:   Recent Labs  07/03/15 0657  WBC 6.9  HGB 9.1*  HCT 29.9*  PLT 193  NA 140  K 4.6  CL 106  CO2 24  BUN 20  CREATININE 1.06*  GLUCOSE 127*  CALCIUM 8.4*     Discharge Medications:     Medication List    STOP taking these medications        aspirin 81 MG tablet     traMADol 50 MG tablet  Commonly known as:  ULTRAM      TAKE these medications        amLODipine 5 MG tablet  Commonly known as:  NORVASC  Take 1 tablet (5 mg total) by mouth daily.     apixaban 2.5 MG Tabs tablet  Commonly known as:  ELIQUIS  Take 1 tab po q12 hours x 14 days following surgery to prevent blood clots     atorvastatin 40 MG tablet  Commonly known as:  LIPITOR  Take 1 tablet (40 mg total) by mouth every morning.     bisacodyl 5 MG EC tablet  Commonly known as:  DULCOLAX  Take 1 tablet (5 mg total) by mouth daily as needed for moderate constipation.  DEPEND UNDERGARMENTS Misc  1 Device by Does not apply route as needed.     methocarbamol 500 MG tablet  Commonly known as:  ROBAXIN  Take 1 tablet (500 mg total) by mouth 4 (four) times daily.     ondansetron 4 MG tablet  Commonly known as:  ZOFRAN  Take 1 tablet (4 mg total) by mouth every 8 (eight) hours as needed for nausea or vomiting.     oxyCODONE-acetaminophen 5-325 MG tablet  Commonly known as:  ROXICET  Take 1-2 tablets by mouth every 4 (four) hours as needed.     valsartan 320 MG tablet  Commonly known as:  DIOVAN  Take 1 tablet (320 mg total) by mouth daily.        Diagnostic Studies: Dg Knee Right Port  07/02/2015  CLINICAL DATA:  Post right total knee replacement EXAM: PORTABLE RIGHT KNEE - 1-2 VIEW COMPARISON:  11/04/2014 FINDINGS: Two views of the right knee submitted. There is new right knee prosthesis with anatomic alignment. Postsurgical changes are noted  with small amount of periarticular soft tissue air. Midline anterior skin staples. IMPRESSION: New right knee prosthesis with anatomic alignment. Electronically Signed   By: Natasha Mead M.D.   On: 07/02/2015 14:47    Disposition: 01-Home or Self Care       Signed: Otilio Saber 07/04/2015, 8:03 AM

## 2015-07-02 NOTE — Transfer of Care (Signed)
Immediate Anesthesia Transfer of Care Note  Patient: [name] [name]  Procedure(s) Performed: Procedure(s): TOTAL KNEE REVISION (Right)  Patient Location: PACU  Anesthesia Type:General  Level of Consciousness: awake  Airway & Oxygen Therapy: Patient Spontanous Breathing and Patient connected to nasal cannula oxygen  Post-op Assessment: Report given to RN and Post -op Vital signs reviewed and stable  Post vital signs: stable  Last Vitals:  Filed Vitals:   07/02/15 1106 07/02/15 1344  BP:  137/63  Pulse: 101 98  Temp:  36.6 C  Resp: 16 12    Last Pain:  Filed Vitals:   07/02/15 1345  PainSc: 8          Complications: No apparent anesthesia complications

## 2015-07-02 NOTE — H&P (View-Only) (Signed)
[name] CapriceMyrtle comes in for follow up finally.  First time I have seen her since the end of last year.  We have been waiting to get clearance to proceed with conversion of her cement spacer back to a total knee.  She has completed antibiotics.  Other than pain around her knee trying to walk with a cement spacer, she has had no elevated temperature and no local or systemic signs of infection.  She continues to have issues with her blood pressure, which has been our biggest stumbling block for getting clearance.  I had her finally come in because we have stopped refilling her pain medicine because we haven't seen her in almost four months.   History and general exam is reviewed.    EXAMINATION: Lungs clear to auscultation bilaterally.  Heart sounds normal.  Specifically, she is awake, alert and afebrile.  Her knee has little to no warmth.  Obviously limited motion 0 to about 30 degrees.  No evidence of local or systemic infection.    X-RAYS: Two view x-ray shows her spacer.  Nothing else adverse.    DISPOSITION: At this point in time I think her infection is resolved.  We are going to get a CBC with diff, repeat her SED rate and C-reactive protein.  We are going to proceed with scheduling, conversion of her back to a stemmed total knee.  Again, we need to get medical clearance before we can proceed.  Procedure, risks, benefits and complications reviewed in detail.  Her entire course reviewed in detail with her and she understands.  We will see her prior to operative intervention.    PROCEDURE: For the sake of completeness to be sure infection is eradicated, we attempted to aspirate her knee under sterile technique with Xylocaine.  This essentially yielded no fluid in her knee and I was very comfortable we were definitely in the knee joint.

## 2015-07-02 NOTE — Anesthesia Procedure Notes (Addendum)
Anesthesia Regional Block:  Adductor canal block  Pre-Anesthetic Checklist: ,, timeout performed, Correct Patient, Correct Site, Correct Laterality, Correct Procedure, Correct Position, site marked, Risks and benefits discussed,  Surgical consent,  Pre-op evaluation,  At surgeon's request and post-op pain management  Laterality: Right  Prep: chloraprep       Needles:  Injection technique: Single-shot  Needle Type: Echogenic Needle     Needle Length: 10cm 10 cm Needle Gauge: 21 and 21 G    Additional Needles:  Procedures: ultrasound guided (picture in chart) Adductor canal block Narrative:  Start time: 07/02/2015 10:45 AM End time: 07/02/2015 10:54 AM Injection made incrementally with aspirations every 5 mL.  Performed by: Personally  Anesthesiologist: Marcene DuosFITZGERALD, ROBERT   Procedure Name: Intubation Date/Time: 07/02/2015 11:24 AM Performed by: Renford DillsMULLINS, Dannelle Rhymes L Pre-anesthesia Checklist: Patient identified, Emergency Drugs available, Suction available and Patient being monitored Patient Re-evaluated:Patient Re-evaluated prior to inductionOxygen Delivery Method: Circle system utilized Preoxygenation: Pre-oxygenation with 100% oxygen Intubation Type: IV induction Ventilation: Mask ventilation without difficulty Laryngoscope Size: Miller and 2 Grade View: Grade III Tube type: Oral Tube size: 7.5 mm Number of attempts: 1 Airway Equipment and Method: Stylet Placement Confirmation: ETT inserted through vocal cords under direct vision,  positive ETCO2 and breath sounds checked- equal and bilateral Secured at: 20 cm Tube secured with: Tape Dental Injury: Teeth and Oropharynx as per pre-operative assessment

## 2015-07-02 NOTE — Interval H&P Note (Signed)
History and Physical Interval Note:  07/02/2015 8:34 AM  [name] [name]  has presented today for surgery, with the diagnosis of right knee mechanical orthopedic implant complication  The various methods of treatment have been discussed with the patient and family. After consideration of risks, benefits and other options for treatment, the patient has consented to  Procedure(s): TOTAL KNEE REVISION (Right) as a surgical intervention .  The patient's history has been reviewed, patient examined, no change in status, stable for surgery.  I have reviewed the patient's chart and labs.  Questions were answered to the patient's satisfaction.     Loreta Aveaniel F Milica Gully

## 2015-07-02 NOTE — Anesthesia Preprocedure Evaluation (Signed)
Anesthesia Evaluation  Patient identified by MRN, date of birth, ID band Patient awake    Reviewed: Allergy & Precautions, H&P , NPO status , Patient's Chart, lab work & pertinent test results, reviewed documented beta blocker date and time   Airway Mallampati: II  TM Distance: >3 FB Neck ROM: Full    Dental no notable dental hx. (+) Teeth Intact, Dental Advisory Given   Pulmonary neg pulmonary ROS, former smoker,    Pulmonary exam normal breath sounds clear to auscultation       Cardiovascular hypertension, Pt. on medications and Pt. on home beta blockers  Rhythm:Regular Rate:Normal     Neuro/Psych negative neurological ROS  negative psych ROS   GI/Hepatic negative GI ROS, Neg liver ROS,   Endo/Other  Morbid obesity  Renal/GU negative Renal ROS  negative genitourinary   Musculoskeletal  (+) Arthritis , Osteoarthritis,    Abdominal   Peds  Hematology negative hematology ROS (+)   Anesthesia Other Findings   Reproductive/Obstetrics negative OB ROS                             Lab Results  Component Value Date   WBC 5.5 06/25/2015   HGB 12.4 06/25/2015   HCT 40.3 06/25/2015   MCV 79.6 06/25/2015   PLT 267 06/25/2015   Lab Results  Component Value Date   CREATININE 0.61 06/25/2015   BUN 9 06/25/2015   NA 141 06/25/2015   K 4.0 06/25/2015   CL 110 06/25/2015   CO2 22 06/25/2015    Anesthesia Physical  Anesthesia Plan  ASA: III  Anesthesia Plan: General and Regional   Post-op Pain Management: GA combined w/ Regional for post-op pain   Induction: Intravenous  Airway Management Planned: Oral ETT  Additional Equipment:   Intra-op Plan:   Post-operative Plan: Extubation in OR  Informed Consent: I have reviewed the patients History and Physical, chart, labs and discussed the procedure including the risks, benefits and alternatives for the proposed anesthesia with the  patient or authorized representative who has indicated his/her understanding and acceptance.   Dental advisory given  Plan Discussed with: CRNA  Anesthesia Plan Comments:         Anesthesia Quick Evaluation

## 2015-07-02 NOTE — ED Provider Notes (Signed)
CSN: [id number]     Arrival date & time 07/02/15  [id number] History   First MD Initiated Contact with Patient 07/02/15 [phone]     Chief Complaint  Patient presents with  . Knee Pain     HPI Patient is scheduled for right knee surgery with Dr. Eulah PontMurphy today.  She states she was unable to get a ride to the hospital and she is scheduled to be in the preoperative arena at 9:30 AM.  She has no complaints at this time.  She called the ambulance for transportation only but they brought her to the emergency department.  She was unaware that calling the ambulance manger having a medical issue that needs evaluation in the emergency department and that they are not a transportation service.  Patient has no complaints.  We have spoken with the operating room and are ready for her in the preoperative arena   Past Medical History  Diagnosis Date  . Hyperlipidemia   . Hypertension   . S/P right knee arthroscopy     with septic joint growing out MSSA  . Septic arthritis of knee, right (HCC)   . Overactive bladder   . Wears glasses   . Borderline diabetes   . Headache    Past Surgical History  Procedure Laterality Date  . Joint replacement    . Knee surgery Bilateral   . Knee arthroscopy Right 12/09/2012    Procedure: ARTHROSCOPIC IRRIGATION AND DEBRIDEMENT RIGHT KNEE;  Surgeon: Budd PalmerMichael H Handy, MD;  Location: [facility] OR;  Service: Orthopedics;  Laterality: Right;  . Total knee revision Right 11/06/2014    Procedure: REMOVAL OF TOTAL COMPONENTS, EXTENSIVE  IRRIGATION AND DEBRIDEMENT  RIGHT KNEE;  Surgeon: Loreta Aveaniel F Murphy, MD;  [facility];  Service: Orthopedics;  Laterality: Right;  . Reimplantation of cemented spacer knee Right 11/06/2014    Procedure: PLACEMENT OF CEMENT SPACER;  Surgeon: Loreta Aveaniel F Murphy, MD;  [facility];  Service: Orthopedics;  Laterality: Right;  . Colonoscopy    . Diagnostic laparoscopy     Family History  Problem Relation Age of Onset  . Cancer - Other Mother   . Hypertension  Other    Social History  Substance Use Topics  . Smoking status: Former Smoker    Types: Cigarettes    Quit date: 08/01/1992  . Smokeless tobacco: Never Used     Comment: pt. does not smoke  . Alcohol Use: No   OB History    No data available     Review of Systems  All other systems reviewed and are negative.     Allergies  Review of patient's allergies indicates no known allergies.  Home Medications   Prior to Admission medications   Medication Sig Start Date End Date Taking? Authorizing Provider  amLODipine (NORVASC) 5 MG tablet Take 1 tablet (5 mg total) by mouth daily. 05/20/15   Twana FirstBryan R Hess, DO  aspirin 81 MG tablet Take 81 mg by mouth every morning.     Historical Provider, MD  atorvastatin (LIPITOR) 40 MG tablet Take 1 tablet (40 mg total) by mouth every morning. 05/22/15   Morrell RiddleSarah L Weber, PA-C  Incontinence Supply Disposable (DEPEND UNDERGARMENTS) MISC 1 Device by Does not apply route as needed. Patient not taking: Reported on 06/18/2015 05/30/15   Morrell RiddleSarah L Weber, PA-C  traMADol (ULTRAM) 50 MG tablet Take 1 tablet (50 mg total) by mouth every 6 (six) hours as needed. Patient taking differently: Take 50 mg by mouth every 6 (  six) hours as needed for moderate pain.  05/16/15   Eyvonne Mechanic, PA-C  valsartan (DIOVAN) 320 MG tablet Take 1 tablet (320 mg total) by mouth daily. 06/16/15   Morrell Riddle, PA-C   BP 146/55 mmHg  Pulse 100  Temp(Src) 98.7 F (37.1 C) (Oral)  Resp 20  SpO2 97% Physical Exam  Constitutional: She is oriented to person, place, and time. She appears well-developed and well-nourished.  HENT:  Head: Normocephalic.  Eyes: EOM are normal.  Neck: Normal range of motion.  Pulmonary/Chest: Effort normal.  Abdominal: She exhibits no distension.  Neurological: She is alert and oriented to person, place, and time.  Psychiatric: She has a normal mood and affect.  Nursing note and vitals reviewed.   ED Course  Procedures (including critical care  time) Labs Review Labs Reviewed - No data to display  Imaging Review No results found. I have personally reviewed and evaluated these images and lab results as part of my medical decision-making.   EKG Interpretation None      MDM   Final diagnoses:  Right knee pain    Medical screening examination completed.  No life-threatening emergency.  Patient be discharged from the emergency department at this time and taken to the preoperative area.     Azalia Bilis, MD 07/02/15 [phone]

## 2015-07-02 NOTE — Progress Notes (Signed)
Utilization review completed.  

## 2015-07-03 LAB — CBC
HEMATOCRIT: 29.9 % — AB (ref 36.0–46.0)
HEMOGLOBIN: 9.1 g/dL — AB (ref 12.0–15.0)
MCH: 25.2 pg — ABNORMAL LOW (ref 26.0–34.0)
MCHC: 30.4 g/dL (ref 30.0–36.0)
MCV: 82.8 fL (ref 78.0–100.0)
Platelets: 193 10*3/uL (ref 150–400)
RBC: 3.61 MIL/uL — AB (ref 3.87–5.11)
RDW: 16 % — ABNORMAL HIGH (ref 11.5–15.5)
WBC: 6.9 10*3/uL (ref 4.0–10.5)

## 2015-07-03 LAB — BASIC METABOLIC PANEL
Anion gap: 10 (ref 5–15)
BUN: 20 mg/dL (ref 6–20)
CHLORIDE: 106 mmol/L (ref 101–111)
CO2: 24 mmol/L (ref 22–32)
Calcium: 8.4 mg/dL — ABNORMAL LOW (ref 8.9–10.3)
Creatinine, Ser: 1.06 mg/dL — ABNORMAL HIGH (ref 0.44–1.00)
GFR calc Af Amer: >60 mL/min (ref >60)
GFR calc non Af Amer: 54 mL/min — ABNORMAL LOW (ref >60)
GLUCOSE: 127 mg/dL — AB (ref 65–99)
POTASSIUM: 4.6 mmol/L (ref 3.5–5.1)
SODIUM: 140 mmol/L (ref 135–145)

## 2015-07-03 NOTE — Clinical Social Work Note (Signed)
Clinical Social Work Assessment  Patient Details  Name: [name] [name] MRN: [id number] Date of Birth: 22-Jun-1950  Date of referral:  07/03/15               Reason for consult:  Facility Placement                Permission sought to share information with:  Family Supports, Oceanographeracility Contact Representative Permission granted to share information::  Yes, Verbal Permission Granted  Name::     [name] [name]  Agency::  [facility] [facility]  Relationship::  Daughter  Contact Information:  [phone]  Housing/Transportation Living arrangements for the past 2 months:  Single Family Home Source of Information:    Patient Interpreter Needed:  None Criminal Activity/Legal Involvement Pertinent to Current Situation/Hospitalization:  No - Comment as needed Significant Relationships:  Adult Children Lives with:  Adult Children Do you feel safe going back to the place where you live?  Yes Need for family participation in patient care:  Yes (Comment) (Patient's family active in patient's care.)  Care giving concerns:  Patient's daughter expressed no concerns at this time.   Social Worker assessment / plan:  LCSW received referral for possible SNF placement at time of discharge. LCSW spoke with patient's daughter regarding discharge plan. Per patient's daughter, patient [facility] but family does not remember facility name. LCSW to provide bed offers once obtained to patient and patient's family.  Employment status:  Retired Health and safety inspectornsurance information:  Medicare PT Recommendations:  Skilled Nursing Facility Information / Referral to community resources:  Skilled Nursing Facility  Patient/Family's Response to care:  Patient's family understanding and agreeable to Johnson & JohnsonLCSW plan of care.  Patient/Family's Understanding of and Emotional Response to Diagnosis, Current Treatment, and Prognosis:  Patient's family understanding and agreeable to LCSW plan of  care.  Emotional Assessment Appearance:  Appears stated age Attitude/Demeanor/Rapport:  Other (LCSW spoke with patient's daughter.) Affect (typically observed):  Other (LCSW spoke with patient's daughter.) Orientation:  Oriented to Self, Oriented to Place, Oriented to  Time, Oriented to Situation Alcohol / Substance use:  Not Applicable Psych involvement (Current and /or in the community):  No (Comment) (Not appropriate on this admissions.)  Discharge Needs  Concerns to be addressed:  No discharge needs identified Readmission within the last 30 days:  No Current discharge risk:  None Barriers to Discharge:  No Barriers Identified   Rod MaeVaughn, Raymel Cull S, LCSW 07/03/2015, 12:42 PM [phone]

## 2015-07-03 NOTE — Progress Notes (Signed)
OT Cancellation Note and Discharge  Patient Details Name: [name] HarborMyrtle Puckett MRN: [id number] DOB: 31-Aug-1950   Cancelled Treatment:    Reason Eval/Treat Not Completed: Other (comment) Pt is Medicare and current D/C plan is SNF. No apparent immediate acute care OT needs, therefore will defer OT to SNF. If OT eval is needed please call Acute Rehab Dept. at [phone] or text page OT at [phone].    Evette GeorgesLeonard, Rulon Abdalla Eva [phone] 07/03/2015, 9:07 AM

## 2015-07-03 NOTE — Anesthesia Postprocedure Evaluation (Signed)
Anesthesia Post Note  Patient: [name] HarborMyrtle Jean  Procedure(s) Performed: Procedure(s) (LRB): TOTAL KNEE REVISION (Right)  Patient location during evaluation: PACU Anesthesia Type: General Level of consciousness: awake and alert and patient cooperative Pain management: pain level controlled Vital Signs Assessment: post-procedure vital signs reviewed and stable Respiratory status: spontaneous breathing and respiratory function stable Cardiovascular status: stable Anesthetic complications: no    Last Vitals:  Filed Vitals:   07/03/15 1700 07/03/15 2027  BP: 150/63 123/53  Pulse: 120 110  Temp: 37.1 C 36.8 C  Resp: 18 18    Last Pain:  Filed Vitals:   07/03/15 2027  PainSc: 2                  Michell Giuliano S

## 2015-07-03 NOTE — NC FL2 (Signed)
[place] MEDICAID FL2 LEVEL OF CARE SCREENING TOOL     IDENTIFICATION  Patient Name: [name] [name] Birthdate: 19-May-1950 Sex: female Admission Date (Current Location): 07/02/2015  [facility] and [place] Number:  Producer, television/film/video and Address:  [facility], [address] [place], [place] [address]      Provider Number: [id number]  Attending Physician Name and Address:  Loreta Ave, MD  Relative Name and Phone Number:       Current Level of Care: Hospital Recommended Level of Care: Skilled Nursing Facility Prior Approval Number:    Date Approved/Denied:   PASRR Number: [id number]  Discharge Plan: SNF    Current Diagnoses: Patient Active Problem List   Diagnosis Date Noted  . 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 Protein-calorie malnutrition (HCC) 11/15/2014  . Osteomyelitis of right knee region (HCC) 11/08/2014  . Complication of device 11/04/2014  . Septic arthritis (HCC) 11/04/2014  . S/P right knee arthroscopy   . UTI (urinary tract infection) 12/09/2012  . 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 Hyperlipidemia   . Hypertension     Orientation RESPIRATION BLADDER Height & Weight     Self, Time, Situation, Place  O2 Continent Weight: 278 lb (126.1 kg) Height:   (165.1 cm)  BEHAVIORAL SYMPTOMS/MOOD NEUROLOGICAL BOWEL NUTRITION STATUS      Continent Diet (Please see discharge summary.)  AMBULATORY STATUS COMMUNICATION OF NEEDS Skin    (Has not attempted with PT) Verbally Surgical wounds                       Personal Care Assistance Level of Assistance  Bathing, Feeding, Dressing Bathing Assistance: Limited assistance Feeding assistance: Independent Dressing Assistance: Limited assistance     Functional Limitations Info             SPECIAL CARE FACTORS FREQUENCY  PT (By licensed PT), OT (By licensed OT)     PT Frequency: 5 OT Frequency: 5            Contractures       Additional Factors Info  Code Status, Allergies Code Status Info: FULL Allergies Info: No known allergies           Current Medications (07/03/2015):  This is the current hospital active medication list Current Facility-Administered Medications  Medication Dose Route Frequency Provider Last Rate Last Dose  . 0.9 % NaCl with KCl 20 mEq/ L  infusion   Intravenous Continuous Cristie Hem, PA-C 50 mL/hr at 07/03/15 0800    . acetaminophen (TYLENOL) tablet 650 mg  650 mg Oral Q6H PRN Cristie Hem, PA-C       Or  . acetaminophen (TYLENOL) suppository 650 mg  650 mg Rectal Q6H PRN Cristie Hem, PA-C      . amLODipine (NORVASC) tablet 5 mg  5 mg Oral Daily Cristie Hem, PA-C   5 mg at 07/03/15 0908  . apixaban (ELIQUIS) tablet 2.5 mg  2.5 mg Oral Q12H Cristie Hem, PA-C   2.5 mg at 07/03/15 0908  . atorvastatin (LIPITOR) tablet 40 mg  40 mg Oral q morning - 10a Cristie Hem, PA-C   40 mg at 07/03/15 0908  . bisacodyl (DULCOLAX) EC tablet 5 mg  5 mg Oral Daily PRN Cristie Hem, PA-C      . celecoxib (CELEBREX) capsule 200 mg  200 mg Oral Q12H Cristie Hem, PA-C   200  mg at 07/03/15 0908  . diphenhydrAMINE (BENADRYL) 12.5 MG/5ML elixir 12.5-25 mg  12.5-25 mg Oral Q4H PRN Cristie HemMary L Stanbery, PA-C      . docusate sodium (COLACE) capsule 100 mg  100 mg Oral BID Cristie HemMary L Stanbery, PA-C   100 mg at 07/03/15 0908  . HYDROmorphone (DILAUDID) injection 0.5-1 mg  0.5-1 mg Intravenous Q2H PRN Cristie HemMary L Stanbery, PA-C      . irbesartan (AVAPRO) tablet 300 mg  300 mg Oral Daily Cristie HemMary L Stanbery, PA-C   300 mg at 07/03/15 0908  . magnesium citrate solution 1 Bottle  1 Bottle Oral Once PRN Cristie HemMary L Stanbery, PA-C      . menthol-cetylpyridinium (CEPACOL) lozenge 3 mg  1 lozenge Oral PRN Cristie HemMary L Stanbery, PA-C       Or  . phenol (CHLORASEPTIC) mouth spray 1 spray  1 spray Mouth/Throat PRN Cristie HemMary L Stanbery, PA-C      . methocarbamol (ROBAXIN) tablet 500 mg  500 mg Oral Q6H PRN Cristie HemMary L Stanbery, PA-C        Or  . methocarbamol (ROBAXIN) 500 mg in dextrose 5 % 50 mL IVPB  500 mg Intravenous Q6H PRN Cristie HemMary L Stanbery, PA-C      . metoCLOPramide (REGLAN) tablet 5-10 mg  5-10 mg Oral Q8H PRN Cristie HemMary L Stanbery, PA-C       Or  . metoCLOPramide (REGLAN) injection 5-10 mg  5-10 mg Intravenous Q8H PRN Cristie HemMary L Stanbery, PA-C      . ondansetron [facility](ZOFRAN) tablet 4 mg  4 mg Oral Q6H PRN Cristie HemMary L Stanbery, PA-C       Or  . ondansetron [facility](ZOFRAN) injection 4 mg  4 mg Intravenous Q6H PRN Cristie HemMary L Stanbery, PA-C      . oxyCODONE (Oxy IR/ROXICODONE) immediate release tablet 5-10 mg  5-10 mg Oral Q3H PRN Cristie HemMary L Stanbery, PA-C   10 mg at 07/03/15 1232  . polyethylene glycol (MIRALAX / GLYCOLAX) packet 17 g  17 g Oral Daily PRN Cristie HemMary L Stanbery, PA-C      . zolpidem (AMBIEN) tablet 5 mg  5 mg Oral QHS PRN Cristie HemMary L Stanbery, PA-C         Discharge Medications: Please see discharge summary for a list of discharge medications.  Relevant Imaging Results:  Relevant Lab Results:   Additional Information SSN: [id number]  Rod MaeVaughn, Aster Eckrich S, LCSW

## 2015-07-03 NOTE — Progress Notes (Signed)
Physical Therapy Treatment Patient Details Name: [name] [name] MRN: [id number] DOB: 1951/01/09 Today's Date: 07/03/2015    History of Present Illness Pt is a 65 y.o. female who initially underwent a Rt TKA which became septic and later underwent removal of the components and had a cemented spacer placed. Pt is now s/p total knee revision. PMH: hypertension, diabetes, bilateral TKA.     PT Comments    Pt making progress with PT, strong encouragement needed to progress mobility. Able to stand at EOB with rw, mod assist to come to standing. Pt unwilling to attempt ambulation at this time. Will continue to follow and progress as tolerated. Continue to recommend SNF following acute stay.   Follow Up Recommendations  SNF;Supervision/Assistance - 24 hour     Equipment Recommendations  None recommended by PT    Recommendations for Other Services       Precautions / Restrictions Precautions Precautions: Knee;Fall Precaution Comments: NO KNEE FLEXION PAST 60 DEGREES. HEP provided, reviewed knee extension precautions.  Restrictions Weight Bearing Restrictions: Yes RLE Weight Bearing: Weight bearing as tolerated    Mobility  Bed Mobility Overal bed mobility: Needs Assistance Bed Mobility: Supine to Sit;Sit to Supine     Supine to sit: Mod assist;HOB elevated (assist with Rt LE) Sit to supine: Mod assist (bilateral LEs)   General bed mobility comments: Using KI per pt request to ensure knee flexion does not go past 60 degrees.   Transfers Overall transfer level: Needs assistance Equipment used: Rolling walker (2 wheeled) Transfers: Sit to/from Stand Sit to Stand: Mod assist         General transfer comment: Cues for hand position. Pt stating that she does not feel like she will be able to stand but able to perform with +1 mod assist. Pt standing EOB approx approx. 3 minutes  Ambulation/Gait                 Stairs            Wheelchair Mobility    Modified  Rankin (Stroke Patients Only)       Balance Overall balance assessment: Needs assistance Sitting-balance support: No upper extremity supported Sitting balance-Leahy Scale: Good     Standing balance support: Bilateral upper extremity supported Standing balance-Leahy Scale: Poor Standing balance comment: using rw for support                    Cognition Arousal/Alertness: Awake/alert Behavior During Therapy: WFL for tasks assessed/performed Overall Cognitive Status: Within Functional Limits for tasks assessed                      Exercises Total Joint Exercises Ankle Circles/Pumps: AROM;Both;10 reps Quad Sets: Strengthening;Right;10 reps Heel Slides: AAROM;Right;10 reps (approx. 30 degrees) Hip ABduction/ADduction: Strengthening;Right;10 reps (mod assist)    General Comments        Pertinent Vitals/Pain Pain Assessment: 0-10 Pain Score: 7  Pain Location: Rt knee Pain Descriptors / Indicators: Aching Pain Intervention(s): Limited activity within patient's tolerance;Monitored during session;Ice applied    Home Living                      Prior Function            PT Goals (current goals can now be found in the care plan section) Acute Rehab PT Goals Patient Stated Goal: be able to walk again PT Goal Formulation: With patient Time For Goal Achievement: 07/17/15 Potential to Achieve Goals:  Good Progress towards PT goals: Progressing toward goals    Frequency  7X/week    PT Plan Current plan remains appropriate    Co-evaluation             End of Session Equipment Utilized During Treatment: Gait belt;Right knee immobilizer Activity Tolerance: Patient tolerated treatment well Patient left: in bed;with call bell/phone within reach;with nursing/sitter in room;with SCD's reapplied;Other (comment) (in bone foam)     Time: [id number] PT Time Calculation (min) (ACUTE ONLY): 25 min  Charges:  $Therapeutic Exercise: 8-22  mins $Therapeutic Activity: 8-22 mins                    G Codes:      Christiane HaBenjamin J. Precious Gilchrest, PT, CSCS Pager [phone] Office [phone]  07/03/2015, 4:26 PM

## 2015-07-03 NOTE — Clinical Social Work Placement (Signed)
   CLINICAL SOCIAL WORK PLACEMENT  NOTE  Date:  07/03/2015  Patient Details  Name: [name] [name] MRN: [id number] Date of Birth: [birth year]  Clinical Social Work is seeking post-discharge placement for this patient at the Skilled  Nursing Facility level of care (*CSW will initial, date and re-position this form in  chart as items are completed):  Yes   Patient/family provided with [facility] Clinical Social Work Department's list of facilities offering this level of care within the geographic area requested by the patient (or if unable, by the patient's family).  Yes   Patient/family informed of their freedom to choose among providers that offer the needed level of care, that participate in Medicare, Medicaid or managed care program needed by the patient, have an available bed and are willing to accept the patient.  Yes   Patient/family informed of [facility]'s ownership interest in [facility] and [facility], as well as of the fact that they are under no obligation to receive care at these facilities.  PASRR submitted to EDS on       PASRR number received on       Existing PASRR number confirmed on 07/03/15     FL2 transmitted to all facilities in geographic area requested by pt/family on 07/03/15     FL2 transmitted to all facilities within larger geographic area on       Patient informed that his/her managed care company has contracts with or will negotiate with certain facilities, including the following:            Patient/family informed of bed offers received.  Patient chooses bed at       Physician recommends and patient chooses bed at      Patient to be transferred to   on  .  Patient to be transferred to facility by       Patient family notified on   of transfer.  Name of family member notified:        PHYSICIAN Please sign FL2     Additional Comment:    _______________________________________________ Rod MaeVaughn, Ciarah Peace S, LCSW 07/03/2015, 12:44  PM

## 2015-07-03 NOTE — Progress Notes (Signed)
Orthopedic Tech Progress Note Patient Details:  [name] HarborMyrtle Stemen March 02, 1950 [id number]  Patient ID: [name] [name], female   DOB: March 02, 1950, 65 y.o.   MRN: [id number] Pt had to much pain to get in cpm. Will call to get in once she gets some pain meds  Trinna PostMartinez, Esma Kilts J 07/03/2015, 5:39 AM

## 2015-07-03 NOTE — Progress Notes (Signed)
Subjective: 1 Day Post-Op Procedure(s) (LRB): TOTAL KNEE REVISION (Right) Patient reports pain as mild.  No nausea/vomiting, chest pain/sob, lightheadedness/dizziness.  Positive flatus, no bm.  Tolerating diet.  Objective: Vital signs in last 24 hours: Temp:  [97.7 F (36.5 C)-100.4 F (38 C)] 99.4 F (37.4 C) (05/18 0457) Pulse Rate:  [94-125] 124 (05/18 0457) Resp:  [10-24] 16 (05/18 0457) BP: (104-172)/(49-70) 104/55 mmHg (05/18 0457) SpO2:  [92 %-100 %] 100 % (05/18 0457) Weight:  [126.1 kg (278 lb)-127.007 kg (280 lb)] 126.1 kg (278 lb) (05/17 1016)  Intake/Output from previous day: 05/17 0701 - 05/18 0700 In: 1610 [P.O.:250; I.V.:1360] Out: 150 [Blood:150] Intake/Output this shift:    No results for input(s): HGB in the last 72 hours. No results for input(s): WBC, RBC, HCT, PLT in the last 72 hours. No results for input(s): NA, K, CL, CO2, BUN, CREATININE, GLUCOSE, CALCIUM in the last 72 hours. No results for input(s): LABPT, INR in the last 72 hours.  Neurologically intact Neurovascular intact Sensation intact distally Intact pulses distally Dorsiflexion/Plantar flexion intact Compartment soft  Assessment/Plan: 1 Day Post-Op Procedure(s) (LRB): TOTAL KNEE REVISION (Right) Advance diet Up with therapy Discharge to SNF once approved by insurance WBAT RLE NO FLEXION OF THE KNEE PAST 60 DEGREES!   Dry dressing change prn  Otilio SaberM Lindsey Madoc Holquin 07/03/2015, 7:07 AM

## 2015-07-03 NOTE — Evaluation (Signed)
Physical Therapy Evaluation Patient Details Name: [name] [name] MRN: [id number] DOB: [birth year] Today's Date: 07/03/2015   History of Present Illness  Pt is a 65 y.o. female who initially underwent a Rt TKA which became septic and later underwent removal of the components and had a cemented spacer placed. Pt is now s/p total knee revision. PMH: hypertension, diabetes, bilateral TKA.   Clinical Impression  Pt is s/p TKA resulting in the deficits listed below (see PT Problem List).  Pt will benefit from skilled PT to increase their independence and safety with mobility to allow discharge to SNF. Mobility is limited at this time with repeated encouragement needed for activity. Pt able to sit at EOB, refusing to attempt standing at this time.      Follow Up Recommendations SNF;Supervision/Assistance - 24 hour    Equipment Recommendations  None recommended by PT    Recommendations for Other Services       Precautions / Restrictions Precautions Precautions: Knee;Fall Precaution Booklet Issued: Yes (comment) Precaution Comments: NO KNEE FLEXION PAST 60 DEGREES. HEP provided, reviewed knee extension precautions.  Restrictions Weight Bearing Restrictions: Yes RLE Weight Bearing: Weight bearing as tolerated      Mobility  Bed Mobility Overal bed mobility: Needs Assistance Bed Mobility: Supine to Sit     Supine to sit: Mod assist;HOB elevated (heavy use of rail. )     General bed mobility comments: Pt needing encouragement to sit at edge of bed. Taking rest breaks at multiple stages of transition. Support of Rt LE provided throughout mobility. LE propped once sitting. Pt able to tolerated sitting EOB approximately 6 minutes.   Transfers                 General transfer comment: Not attempted at this time.   Ambulation/Gait                Stairs            Wheelchair Mobility    Modified Rankin (Stroke Patients Only)       Balance Overall balance  assessment: Needs assistance Sitting-balance support: No upper extremity supported Sitting balance-Leahy Scale: Fair                                       Pertinent Vitals/Pain Pain Assessment: 0-10 Pain Score: 8  Pain Location: Rt knee and thigh Pain Descriptors / Indicators: Aching Pain Intervention(s): Limited activity within patient's tolerance;Monitored during session;Ice applied    Home Living Family/patient expects to be discharged to:: Skilled nursing facility Living Arrangements: Children               Additional Comments: Pt lives with her daughter but planning for D/C to SNF upon D/C.     Prior Function Level of Independence: Needs assistance   Gait / Transfers Assistance Needed: Pt reports using w/c for majority of mobility, able to ambulate 5 steps into bathroom at most.   ADL's / Homemaking Assistance Needed: family assistance with household tasks        Hand Dominance        Extremity/Trunk Assessment   Upper Extremity Assessment: Defer to OT evaluation           Lower Extremity Assessment: RLE deficits/detail RLE Deficits / Details: poor quad activation, unable to perform SLR       Communication   Communication: No difficulties  Cognition Arousal/Alertness: Awake/alert Behavior During  Therapy: WFL for tasks assessed/performed Overall Cognitive Status: Within Functional Limits for tasks assessed                      General Comments      Exercises        Assessment/Plan    PT Assessment Patient needs continued PT services  PT Diagnosis Difficulty walking   PT Problem List Decreased strength;Decreased range of motion;Decreased activity tolerance;Decreased balance;Decreased mobility  PT Treatment Interventions DME instruction;Gait training;Stair training;Functional mobility training;Therapeutic exercise;Therapeutic activities;Balance training;Patient/family education   PT Goals (Current goals can be found  in the Care Plan section) Acute Rehab PT Goals Patient Stated Goal: be able to walk again PT Goal Formulation: With patient Time For Goal Achievement: 07/17/15 Potential to Achieve Goals: Good    Frequency 7X/week   Barriers to discharge        Co-evaluation               End of Session   Activity Tolerance: Patient limited by fatigue Patient left: in bed;with call bell/phone within reach;with nursing/sitter in room;with SCD's reapplied;Other (comment) (in bone foam) Nurse Communication: Mobility status;Weight bearing status         Time: 0825-0909 PT Time Calculation (min) (ACUTE ONLY): 44 min   Charges:   PT Evaluation $PT Eval Moderate Complexity: 1 Procedure PT Treatments $Therapeutic Activity: 23-37 mins   PT G Codes:        Christiane Ha, PT, CSCS Pager [phone] Office [phone]  07/03/2015, 9:26 AM

## 2015-07-04 ENCOUNTER — Encounter (HOSPITAL_COMMUNITY): Payer: Self-pay | Admitting: Orthopedic Surgery

## 2015-07-04 LAB — BASIC METABOLIC PANEL
Anion gap: 12 (ref 5–15)
BUN: 10 mg/dL (ref 6–20)
CHLORIDE: 109 mmol/L (ref 101–111)
CO2: 20 mmol/L — AB (ref 22–32)
CREATININE: 0.72 mg/dL (ref 0.44–1.00)
Calcium: 9.4 mg/dL (ref 8.9–10.3)
GFR calc Af Amer: >60 mL/min (ref >60)
GFR calc non Af Amer: >60 mL/min (ref >60)
Glucose, Bld: 188 mg/dL — ABNORMAL HIGH (ref 65–99)
Potassium: 4.9 mmol/L (ref 3.5–5.1)
Sodium: 141 mmol/L (ref 135–145)

## 2015-07-04 LAB — CBC
HCT: 33.6 % — ABNORMAL LOW (ref 36.0–46.0)
HEMOGLOBIN: 9.9 g/dL — AB (ref 12.0–15.0)
MCH: 23.9 pg — AB (ref 26.0–34.0)
MCHC: 29.5 g/dL — AB (ref 30.0–36.0)
MCV: 81.2 fL (ref 78.0–100.0)
Platelets: 186 10*3/uL (ref 150–400)
RBC: 4.14 MIL/uL (ref 3.87–5.11)
RDW: 15.7 % — AB (ref 11.5–15.5)
WBC: 12.7 10*3/uL — ABNORMAL HIGH (ref 4.0–10.5)

## 2015-07-04 NOTE — Op Note (Signed)
NAMMancel [name]:  [name], [name]             ACCOUNT NO.:  [id number]  MEDICAL RECORD NO.:  [id number]  LOCATION:  5N12C                        FACILITY:  [facility]  PHYSICIAN:  Loreta Aveaniel F. Temekia Caskey, M.D. 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  Hematogenous infection.  Debridement with removal of all implants and placement of antibiotic implanted cement spacer.  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  Hematogenous infection.  Debridement with removal of all implants and placement of antibiotic implanted cement spacer with no evidence of persistent infection.  Extensive intra and extra-articular arthrofibrosis.  PROCEDURE:  Right knee aspiration.  Extensive debridement of intra and extra-articular adhesions.  Removal of cement spacers.  Revision to a new total knee replacement utilizing Stryker instrumentation.  A cemented #3 TS femoral component with a 13 x 100 mm stem.  A cemented #4 Universal tibial base plate with a 14 x 100 mm stem.  An 11 mm TS polyethylene insert with patella _________.  Resurfacing patella with a cemented pegged medial offset 35 mm patellar component.  Lateral retinacular release.  Repair of partially avulsed patellar tendon occurring during exposure.  Accomplished with a 5.5 mm Bio-Anchor and FiberWire suture.  SURGEON:  Loreta Aveaniel F. Demonie Kassa, MD  ASSISTANT:  Mikey KirschnerLindsey Stanberry, PA, present throughout the entire case, necessary for timely completion of procedure.  ANESTHESIA:  General.  BLOOD LOSS:  Minimal.  SPECIMENS:  None.  CULTURES:  None.  COMPLICATIONS:  None.  DRESSINGS:  Soft compressive knee immobilizer.  TOURNIQUET TIME:  1 hour 30 minutes.  DESCRIPTION OF PROCEDURE:  The patient was brought to the operating room and after adequate anesthesia had  been obtained, tourniquet applied, prepped and draped in usual sterile fashion.  Exsanguinated with elevation of Esmarch.  Tourniquet inflated to 350 mmHg.  Virtually no motion 0 to maybe 10 degrees.  Previous incision opened.  Skin and subcutaneous tissue divided.  Extensive adhesions debrided.  A full medial arthrotomy going up into the patellar tendon.  I then spent a great deal of time freeing up adhesions, debrided this out.  As we exposed and laterally subluxed the patella, 75% of the patellar tendon avulsed off the tibial tubercle.  Cement spacers were then removed.  No evidence of recurrent infection.  With intramedullary guide, I then made a resurfacing cut on both the tibia and femur.  Using appropriate instrumentation, the femur was sized, cut, and fitted for a #3 TS mm component with a pre-reamed 13 x 100 mm stem.  Proximal tibial resection.  Intramedullary guide.  This was sized for a #4 component with a 14 x 100 mm pre-reamed stem.  With the 11 mm insert in place, I was pleased with balancing stability motion.  Patella exposed.  Removed little bit of the back enough to reimplant a 35-mm component.  With trials in place, I was very pleased with alignment, stability.  All trials removed.  Copious irrigation with pulse irrigating device. Cement prepared, placed on all components which were firmly seated. Polyethylene attached to tibia, knee  reduced.  Once the cement hardened, the knee was irrigated once again.  Soft tissue was injected with Exparel.  A 5.5 anchor was then placed to tibial tubercle.  A #2 FiberWire sutures were then used to reattach the patellar tendon and so that back into the medial aspect of the arthrotomy there.  The remaining arthrotomy was closed with #1 Vicryl.  Once that was complete, I could carry to 90 degrees of flexion without too much tension on the repair. Wound irrigated.  Subcutaneous closure.  The wound site was then closed with staples.   Sterile compressive dressing applied.  Tourniquet deflated and removed.  Knee immobilizer applied.  Anesthesia reversed. Brought to the recovery room.  Tolerated the surgery well.  Of note, in order to balance the patellofemoral joint, I did have to do a formal lateral retinacular release.  Once that was accomplished, I was pleased with patellar tracking.     Loreta Ave, M.D.     DFM/MEDQ  D:  07/03/2015  T:  07/04/2015  Job:  [id number]

## 2015-07-04 NOTE — Progress Notes (Signed)
Subjective: 2 Days Post-Op Procedure(s) (LRB): TOTAL KNEE REVISION (Right) Patient reports pain as mild.  No nausea/vomiting, lightheadedness/dizziness, chest pain/sob.  Positive flatus but no bm.  Tolerating diet.  Objective: Vital signs in last 24 hours: Temp:  [98.1 F (36.7 C)-98.8 F (37.1 C)] 98.1 F (36.7 C) (05/19 0514) Pulse Rate:  [103-120] 103 (05/19 0514) Resp:  [18] 18 (05/19 0514) BP: (117-150)/(48-63) 117/48 mmHg (05/19 0514) SpO2:  [99 %-100 %] 100 % (05/19 0514)  Intake/Output from previous day: 05/18 0701 - 05/19 0700 In: 3468.3 [P.O.:1160; I.V.:2308.3] Out: -  Intake/Output this shift:     Recent Labs  07/03/15 0657  HGB 9.1*    Recent Labs  07/03/15 0657  WBC 6.9  RBC 3.61*  HCT 29.9*  PLT 193    Recent Labs  07/03/15 0657  NA 140  K 4.6  CL 106  CO2 24  BUN 20  CREATININE 1.06*  GLUCOSE 127*  CALCIUM 8.4*   No results for input(s): LABPT, INR in the last 72 hours.  Neurologically intact Neurovascular intact Sensation intact distally Intact pulses distally Dorsiflexion/Plantar flexion intact Compartment soft  Assessment/Plan: 2 Days Post-Op Procedure(s) (LRB): TOTAL KNEE REVISION (Right) Advance diet Up with therapy Discharge to SNF once approved by insurance WBAT RLE NO FLEXION OF THE RIGHT KNEE PAST 60 DEGREES ABLA-MILD AND STABLE DRY DRESSING CHANGE PRN  Otilio SaberM Lindsey Izzabelle Bouley 07/04/2015, 8:04 AM

## 2015-07-04 NOTE — Progress Notes (Signed)
Orthopedic Tech Progress Note Patient Details:  [name] HarborMyrtle Risk 04-22-1950 [id number]  Patient ID: [name] [name], female   DOB: 04-22-1950, 65 y.o.   MRN: [id number] Placed pt's rle on cpm @ 0-60 degrees @1350   Nikki DomCrawford, Tinesha Siegrist 07/04/2015, 1:56 PM

## 2015-07-04 NOTE — Discharge Instructions (Signed)
INSTRUCTIONS AFTER JOINT REPLACEMENT   o Remove items at home which could result in a fall. This includes throw rugs or furniture in walking pathways o ICE to the affected joint every three hours while awake for 30 minutes at a time, for at least the first 3-5 days, and then as needed for pain and swelling.  Continue to use ice for pain and swelling. You may notice swelling that will progress down to the foot and ankle.  This is normal after surgery.  Elevate your leg when you are not up walking on it.   o Continue to use the breathing machine you got in the hospital (incentive spirometer) which will help keep your temperature down.  It is common for your temperature to cycle up and down following surgery, especially at night when you are not up moving around and exerting yourself.  The breathing machine keeps your lungs expanded and your temperature down.  BLOOD THINNER:  TAKE ELIQUIS AS PRESCRIBED X 14 DAYS FOLLOWING SURGERY.  ONCE FINISHED WITH THIS, TAKE ASPIRIN 325 MG ONE TABLET ONCE DAILY FOR THE NEXT 14 DAYS.  THESE ARE BOTH USED TO PREVENT BLOOD CLOTS.  DIET:  As you were doing prior to hospitalization, we recommend a well-balanced diet.  DRESSING / WOUND CARE / SHOWERING  Change dressing every 3 days.  Do not get dressing wet.   ACTIVITY- DO NOT BEND KNEE PAST 60 DEGREES  o Increase activity slowly as tolerated, but follow the weight bearing instructions below.   o No driving for 6 weeks or until further direction given by your physician.  You cannot drive while taking narcotics.  o No lifting or carrying greater than 10 lbs. until further directed by your surgeon. o Avoid periods of inactivity such as sitting longer than an hour when not asleep. This helps prevent blood clots.  o You may return to work once you are authorized by your doctor.     WEIGHT BEARING   Weight bearing as tolerated with assist device (walker, cane, etc) as directed, use it as long as suggested by your  surgeon or therapist, typically at least 4-6 weeks.   EXERCISES  Results after joint replacement surgery are often greatly improved when you follow the exercise, range of motion and muscle strengthening exercises prescribed by your doctor. Safety measures are also important to protect the joint from further injury. Any time any of these exercises cause you to have increased pain or swelling, decrease what you are doing until you are comfortable again and then slowly increase them. If you have problems or questions, call your caregiver or physical therapist for advice.   Rehabilitation is important following a joint replacement. After just a few days of immobilization, the muscles of the leg can become weakened and shrink (atrophy).  These exercises are designed to build up the tone and strength of the thigh and leg muscles and to improve motion. Often times heat used for twenty to thirty minutes before working out will loosen up your tissues and help with improving the range of motion but do not use heat for the first two weeks following surgery (sometimes heat can increase post-operative swelling).   These exercises can be done on a training (exercise) mat, on the floor, on a table or on a bed. Use whatever works the best and is most comfortable for you.    Use music or television while you are exercising so that the exercises are a pleasant break in your day. This  will make your life better with the exercises acting as a break in your routine that you can look forward to.   Perform all exercises about fifteen times, three times per day or as directed.  You should exercise both the operative leg and the other leg as well.  Exercises include:    Quad Sets - Tighten up the muscle on the front of the thigh (Quad) and hold for 5-10 seconds.    Straight Leg Raises - With your knee straight (if you were given a brace, keep it on), lift the leg to 60 degrees, hold for 3 seconds, and slowly lower the leg.   Perform this exercise against resistance later as your leg gets stronger.   Leg Slides: Lying on your back, slowly slide your foot toward your buttocks, bending your knee up off the floor (only go as far as is comfortable). Then slowly slide your foot back down until your leg is flat on the floor again.   Angel Wings: Lying on your back spread your legs to the side as far apart as you can without causing discomfort.   Hamstring Strength:  Lying on your back, push your heel against the floor with your leg straight by tightening up the muscles of your buttocks.  Repeat, but this time bend your knee to a comfortable angle, and push your heel against the floor.  You may put a pillow under the heel to make it more comfortable if necessary.   A rehabilitation program following joint replacement surgery can speed recovery and prevent re-injury in the future due to weakened muscles. Contact your doctor or a physical therapist for more information on knee rehabilitation.    CONSTIPATION  Constipation is defined medically as fewer than three stools per week and severe constipation as less than one stool per week.  Even if you have a regular bowel pattern at home, your normal regimen is likely to be disrupted due to multiple reasons following surgery.  Combination of anesthesia, postoperative narcotics, change in appetite and fluid intake all can affect your bowels.   YOU MUST use at least one of the following options; they are listed in order of increasing strength to get the job done.  They are all available over the counter, and you may need to use some, POSSIBLY even all of these options:    Drink plenty of fluids (prune juice may be helpful) and high fiber foods Colace 100 mg by mouth twice a day  Senokot for constipation as directed and as needed Dulcolax (bisacodyl), take with full glass of water  Miralax (polyethylene glycol) once or twice a day as needed.  If you have tried all these things and  are unable to have a bowel movement in the first 3-4 days after surgery call either your surgeon or your primary doctor.    If you experience loose stools or diarrhea, hold the medications until you stool forms back up.  If your symptoms do not get better within 1 week or if they get worse, check with your doctor.  If you experience "the worst abdominal pain ever" or develop nausea or vomiting, please contact the office immediately for further recommendations for treatment.   ITCHING:  If you experience itching with your medications, try taking only a single pain pill, or even half a pain pill at a time.  You can also use Benadryl over the counter for itching or also to help with sleep.   TED HOSE STOCKINGS:  Use  stockings on both legs until for at least 2 weeks or as directed by physician office. They may be removed at night for sleeping.  MEDICATIONS:  See your medication summary on the After Visit Summary that nursing will review with you.  You may have some home medications which will be placed on hold until you complete the course of blood thinner medication.  It is important for you to complete the blood thinner medication as prescribed.  PRECAUTIONS:  If you experience chest pain or shortness of breath - call 911 immediately for transfer to the hospital emergency department.   If you develop a fever greater that 101 F, purulent drainage from wound, increased redness or drainage from wound, foul odor from the wound/dressing, or calf pain - CONTACT YOUR SURGEON.                                                   FOLLOW-UP APPOINTMENTS:  If you do not already have a post-op appointment, please call the office for an appointment to be seen by your surgeon.  Guidelines for how soon to be seen are listed in your After Visit Summary, but are typically between 1-4 weeks after surgery.  OTHER INSTRUCTIONS:   Knee Replacement:  Do not place pillow under knee, focus on keeping the knee straight  while resting. CPM instructions: 0-90 degrees, 2 hours in the morning, 2 hours in the afternoon, and 2 hours in the evening. Place foam block, curve side up under heel at all times except when in CPM or when walking.  DO NOT modify, tear, cut, or change the foam block in any way.  MAKE SURE YOU:   Understand these instructions.   Get help right away if you are not doing well or get worse.    Thank you for letting us be a part of your medical care team.  It is a privilege we respect greatly.  We hope these instructions will help you stay on track for a fast and full recovery!   ___________________________________ Information on my medicine - ELIQUIS (apixaban)  This medication education was reviewed with me or my healthcare representative as part of my discharge preparation.    Why was Eliquis prescribed for you? Eliquis was prescribed for you to reduce the risk of blood clots forming after orthopedic surgery.    What do You need to know about Eliquis? Take your Eliquis TWICE DAILY - one tablet in the morning and one tablet in the evening with or without food.  It would be best to take the dose about the same time each day.  If you have difficulty swallowing the tablet whole please discuss with your pharmacist how to take the medication safely.  Take Eliquis exactly as prescribed by your doctor and DO NOT stop taking Eliquis without talking to the doctor who prescribed the medication.  Stopping without other medication to take the place of Eliquis may increase your risk of developing a clot.  After discharge, you should have regular check-up appointments with your healthcare provider that is prescribing your Eliquis.  What do you do if you miss a dose? If a dose of ELIQUIS is not taken at the scheduled time, take it as soon as possible on the same day and twice-daily administration should be resumed.  The dose should not be doubled to make  up for a missed dose.  Do not take more  than one tablet of ELIQUIS at the same time.  Important Safety Information A possible side effect of Eliquis is bleeding. You should call your healthcare provider right away if you experience any of the following: ? Bleeding from an injury or your nose that does not stop. ? Unusual colored urine (red or dark brown) or unusual colored stools (red or black). ? Unusual bruising for unknown reasons. ? A serious fall or if you hit your head (even if there is no bleeding).  Some medicines may interact with Eliquis and might increase your risk of bleeding or clotting while on Eliquis. To help avoid this, consult your healthcare provider or pharmacist prior to using any new prescription or non-prescription medications, including herbals, vitamins, non-steroidal anti-inflammatory drugs (NSAIDs) and supplements.  This website has more information on Eliquis (apixaban): http://www.eliquis.com/eliquis/home

## 2015-07-04 NOTE — Clinical Social Work Note (Addendum)
Patient will discharge Saturday per MD order (3 midnights) [facility] RN to call report prior to transportation to: [phone] Transportation: [facility]- to be arranged by weekend staff  CSW sent discharge summary to SNF for review.   Vickii PennaGina Lyrique Hakim, LCSW [phone]  5N1-9, 2S 15-16 and Psychiatric Service Line  Licensed Clinical Social Worker

## 2015-07-04 NOTE — Progress Notes (Signed)
Physical Therapy Treatment Patient Details Name: [name] HarborMyrtle [name] MRN: [id number] DOB: [birth year] Today's Date: 07/04/2015    History of Present Illness Pt is a 65 y.o. female who initially underwent a Rt TKA which became septic and later underwent removal of the components and had a cemented spacer placed. Pt is now s/p total knee revision. PMH: hypertension, diabetes, bilateral TKA.     PT Comments    Pt able to progress to attempting steps today, ambulating 5 feet with rw. Pt progressing slowly regarding mobility but making progress. Based upon the patient's current mobility level, continue to recommend SNF upon D/C.    Follow Up Recommendations  SNF;Supervision/Assistance - 24 hour     Equipment Recommendations  None recommended by PT    Recommendations for Other Services       Precautions / Restrictions Precautions Precautions: Knee;Fall Precaution Comments: NO KNEE FLEXION PAST 60 DEGREES. HEP provided, reviewed knee extension precautions.  Restrictions Weight Bearing Restrictions: Yes RLE Weight Bearing: Weight bearing as tolerated    Mobility  Bed Mobility Overal bed mobility: Needs Assistance Bed Mobility: Supine to Sit     Supine to sit: Mod assist;HOB elevated     General bed mobility comments: Pt moving slowly but able to get to sitting EOB with mod assist with Rt LE.   Transfers Overall transfer level: Needs assistance Equipment used: Rolling walker (2 wheeled) Transfers: Sit to/from Stand Sit to Stand: Mod assist         General transfer comment: assist to stabilize rw.   Ambulation/Gait Ambulation/Gait assistance: Min guard Ambulation Distance (Feet): 5 Feet Assistive device: Rolling walker (2 wheeled) Gait Pattern/deviations: Step-to pattern;Decreased step length - left;Decreased stance time - right;Decreased weight shift to right Gait velocity: very slow   General Gait Details: Repeated cues for pt to bear weight through the Rt LE. Due to  poor weightshift, pt quickly scooting/sliding LLE forward. Pt refusing to ambulate any further despite encouragement, reports feeling too tired.    Stairs            Wheelchair Mobility    Modified Rankin (Stroke Patients Only)       Balance Overall balance assessment: Needs assistance Sitting-balance support: No upper extremity supported Sitting balance-Leahy Scale: Good     Standing balance support: Bilateral upper extremity supported Standing balance-Leahy Scale: Poor Standing balance comment: using rw for support                    Cognition Arousal/Alertness: Awake/alert Behavior During Therapy: WFL for tasks assessed/performed Overall Cognitive Status: Within Functional Limits for tasks assessed                      Exercises Total Joint Exercises Ankle Circles/Pumps: AROM;Both;10 reps Quad Sets: Strengthening;Right;10 reps Heel Slides: AAROM;Right;10 reps (approx. 30 degrees) Hip ABduction/ADduction: Strengthening;Right;10 reps (min assist) Goniometric ROM: approx. 30 degrees flexion    General Comments        Pertinent Vitals/Pain Pain Assessment: 0-10 Pain Score: 8  Pain Location: rt knee Pain Descriptors / Indicators: Aching Pain Intervention(s): Limited activity within patient's tolerance;Monitored during session    Home Living                      Prior Function            PT Goals (current goals can now be found in the care plan section) Acute Rehab PT Goals Patient Stated Goal: improve PT Goal Formulation: With  patient Time For Goal Achievement: 07/17/15 Potential to Achieve Goals: Good Progress towards PT goals: Progressing toward goals    Frequency  7X/week    PT Plan Current plan remains appropriate    Co-evaluation             End of Session Equipment Utilized During Treatment: Gait belt;Right knee immobilizer Activity Tolerance: Patient tolerated treatment well Patient left: in chair;with  call bell/phone within reach;with nursing/sitter in room;Other (comment) (in knee extension, refused bone foam)     Time: 1610-9604 PT Time Calculation (min) (ACUTE ONLY): 24 min  Charges:  $Gait Training: 8-22 mins $Therapeutic Exercise: 8-22 mins                    G Codes:      Christiane Ha, PT, CSCS Pager [phone] Office [phone]  07/04/2015, 9:44 AM

## 2015-07-04 NOTE — Clinical Social Work Placement (Signed)
   CLINICAL SOCIAL WORK PLACEMENT  NOTE  Date:  07/04/2015  Patient Details  Name: [name] [name] MRN: [id number] Date of Birth: 1950/11/08  Clinical Social Work is seeking post-discharge placement for this patient at the Skilled  Nursing Facility level of care (*CSW will initial, date and re-position this form in  chart as items are completed):  Yes   Patient/family provided with [facility] Clinical Social Work Department's list of facilities offering this level of care within the geographic area requested by the patient (or if unable, by the patient's family).  Yes   Patient/family informed of their freedom to choose among providers that offer the needed level of care, that participate in Medicare, Medicaid or managed care program needed by the patient, have an available bed and are willing to accept the patient.  Yes   Patient/family informed of [facility]'s ownership interest in [facility] and [facility], as well as of the fact that they are under no obligation to receive care at these facilities.  PASRR submitted to EDS on       PASRR number received on       Existing PASRR number confirmed on 07/03/15     FL2 transmitted to all facilities in geographic area requested by pt/family on 07/03/15     FL2 transmitted to all facilities within larger geographic area on       Patient informed that his/her managed care company has contracts with or will negotiate with certain facilities, including the following:        Yes   Patient/family informed of bed offers received.  [facility]     Physician recommends and patient chooses bed at      [facility].  Patient to be transferred to facility by [facility]     Patient family notified on 07/04/15 of transfer.  Name of family member notified:  son and patient     PHYSICIAN Please sign FL2     Additional Comment:     _______________________________________________ Rondel BatonIngle, Maverik Foot C, LCSW 07/04/2015, 1:44 PM

## 2015-07-05 DIAGNOSIS — M1990 Unspecified osteoarthritis, unspecified site: Secondary | ICD-10-CM | POA: Diagnosis not present

## 2015-07-05 DIAGNOSIS — Z96651 Presence of right artificial knee joint: Secondary | ICD-10-CM | POA: Diagnosis not present

## 2015-07-05 DIAGNOSIS — M6281 Muscle weakness (generalized): Secondary | ICD-10-CM | POA: Diagnosis not present

## 2015-07-05 DIAGNOSIS — E119 Type 2 diabetes mellitus without complications: Secondary | ICD-10-CM | POA: Diagnosis not present

## 2015-07-05 DIAGNOSIS — R278 Other lack of coordination: Secondary | ICD-10-CM | POA: Diagnosis not present

## 2015-07-05 DIAGNOSIS — Z4789 Encounter for other orthopedic aftercare: Secondary | ICD-10-CM | POA: Diagnosis not present

## 2015-07-05 DIAGNOSIS — M00269 Other streptococcal arthritis, unspecified knee: Secondary | ICD-10-CM | POA: Diagnosis not present

## 2015-07-05 DIAGNOSIS — T8453XD Infection and inflammatory reaction due to internal right knee prosthesis, subsequent encounter: Secondary | ICD-10-CM | POA: Diagnosis not present

## 2015-07-05 DIAGNOSIS — E782 Mixed hyperlipidemia: Secondary | ICD-10-CM | POA: Diagnosis not present

## 2015-07-05 DIAGNOSIS — M25569 Pain in unspecified knee: Secondary | ICD-10-CM | POA: Diagnosis not present

## 2015-07-05 DIAGNOSIS — E784 Other hyperlipidemia: Secondary | ICD-10-CM | POA: Diagnosis not present

## 2015-07-05 DIAGNOSIS — M00061 Staphylococcal arthritis, right knee: Secondary | ICD-10-CM | POA: Diagnosis not present

## 2015-07-05 DIAGNOSIS — I10 Essential (primary) hypertension: Secondary | ICD-10-CM | POA: Diagnosis not present

## 2015-07-05 LAB — BASIC METABOLIC PANEL
ANION GAP: 9 (ref 5–15)
BUN: 11 mg/dL (ref 6–20)
CHLORIDE: 108 mmol/L (ref 101–111)
CO2: 24 mmol/L (ref 22–32)
Calcium: 8.8 mg/dL — ABNORMAL LOW (ref 8.9–10.3)
Creatinine, Ser: 0.67 mg/dL (ref 0.44–1.00)
GFR calc non Af Amer: >60 mL/min (ref >60)
Glucose, Bld: 104 mg/dL — ABNORMAL HIGH (ref 65–99)
POTASSIUM: 4.3 mmol/L (ref 3.5–5.1)
SODIUM: 141 mmol/L (ref 135–145)

## 2015-07-05 LAB — CBC
HEMATOCRIT: 30.6 % — AB (ref 36.0–46.0)
HEMOGLOBIN: 9.1 g/dL — AB (ref 12.0–15.0)
MCH: 24.1 pg — ABNORMAL LOW (ref 26.0–34.0)
MCHC: 29.7 g/dL — AB (ref 30.0–36.0)
MCV: 81 fL (ref 78.0–100.0)
Platelets: 206 10*3/uL (ref 150–400)
RBC: 3.78 MIL/uL — AB (ref 3.87–5.11)
RDW: 15.9 % — ABNORMAL HIGH (ref 11.5–15.5)
WBC: 9.4 10*3/uL (ref 4.0–10.5)

## 2015-07-05 NOTE — Progress Notes (Signed)
Report called to Sherry RuffingAntoinette, LPN at [facility].  Prescriptions to be sent with transport.  [facility] to take patient to facility.  Discharge delayed slightly because patient needed to have a BM when [facility] arrived.  Patient had successful BM and pericare was performed by NT.  IV removed.  Pain medication prior to discharge.  No questions at the time of discharge.  Dressing clean, dry and intact.

## 2015-07-05 NOTE — Discharge Summary (Signed)
Discharge Summary  Patient ID: [name] [name] MRN: [id number] DOB/AGE: [birth year] 65 y.o.  Admit date: 07/02/2015 Discharge date: 07/05/2015  Admission Diagnoses:  S/p TOTAL KNEE REVISION (Right)TOTAL KNEE REVISION (Right)  Discharge Diagnoses:  Active Problems:   S/P revision of total knee   Past Medical History  Diagnosis Date  . Hyperlipidemia   . Hypertension   . S/P right knee arthroscopy     with septic joint growing out MSSA  . Overactive bladder   . Wears glasses   . Borderline diabetes   . Septic arthritis of knee, right (HCC)   . Arthritis     "right knee" (07/02/2015)    Surgeries: Procedure(s): TOTAL KNEE REVISION on 07/02/2015   Consultants (if any):  PT/OT/SW  Discharged Condition: Improved  Hospital Course: [name] [name] is an 65 y.o. female who was admitted 07/02/2015 with a diagnosis of Hematogenous infection and went to the operating room on 07/02/2015 and underwent the above named procedures.    She was given perioperative antibiotics:  Anti-infectives    Start     Dose/Rate Route Frequency Ordered Stop   07/02/15 2100  vancomycin (VANCOCIN) IVPB 1000 mg/200 mL premix     1,000 mg 200 mL/hr over 60 Minutes Intravenous Every 12 hours 07/02/15 1524 07/02/15 2225   07/02/15 1203  tobramycin (NEBCIN) powder  Status:  Discontinued       As needed 07/02/15 1203 07/02/15 1337    .  She was given sequential compression devices, early ambulation, and Eliquis for DVT prophylaxis.  She benefited maximally from the hospital stay and there were no complications.    Recent vital signs:  Filed Vitals:   07/04/15 2118 07/05/15 0518  BP: 139/58 145/66  Pulse: 113 100  Temp: 98.3 F (36.8 C) 98.2 F (36.8 C)  Resp: 18 17    Recent laboratory studies:  Lab Results  Component Value Date   HGB 9.1* 07/05/2015   HGB 9.9* 07/04/2015   HGB 9.1* 07/03/2015   Lab Results  Component Value Date   WBC 9.4 07/05/2015   PLT 206 07/05/2015   Lab Results   Component Value Date   INR 1.12 11/05/2014   Lab Results  Component Value Date   NA 141 07/05/2015   K 4.3 07/05/2015   CL 108 07/05/2015   CO2 24 07/05/2015   BUN 11 07/05/2015   CREATININE 0.67 07/05/2015   GLUCOSE 104* 07/05/2015    Discharge Medications:     Medication List    STOP taking these medications        aspirin 81 MG tablet     traMADol 50 MG tablet  Commonly known as:  ULTRAM      TAKE these medications        amLODipine 5 MG tablet  Commonly known as:  NORVASC  Take 1 tablet (5 mg total) by mouth daily.     apixaban 2.5 MG Tabs tablet  Commonly known as:  ELIQUIS  Take 1 tab po q12 hours x 14 days following surgery to prevent blood clots     atorvastatin 40 MG tablet  Commonly known as:  LIPITOR  Take 1 tablet (40 mg total) by mouth every morning.     bisacodyl 5 MG EC tablet  Commonly known as:  DULCOLAX  Take 1 tablet (5 mg total) by mouth daily as needed for moderate constipation.     DEPEND UNDERGARMENTS Misc  1 Device by Does not apply route as needed.  methocarbamol 500 MG tablet  Commonly known as:  ROBAXIN  Take 1 tablet (500 mg total) by mouth 4 (four) times daily.     ondansetron 4 MG tablet  Commonly known as:  ZOFRAN  Take 1 tablet (4 mg total) by mouth every 8 (eight) hours as needed for nausea or vomiting.     oxyCODONE-acetaminophen 5-325 MG tablet  Commonly known as:  ROXICET  Take 1-2 tablets by mouth every 4 (four) hours as needed.     valsartan 320 MG tablet  Commonly known as:  DIOVAN  Take 1 tablet (320 mg total) by mouth daily.        Diagnostic Studies: Dg Knee Right Port  07/02/2015  CLINICAL DATA:  Post right total knee replacement EXAM: PORTABLE RIGHT KNEE - 1-2 VIEW COMPARISON:  11/04/2014 FINDINGS: Two views of the right knee submitted. There is new right knee prosthesis with anatomic alignment. Postsurgical changes are noted with small amount of periarticular soft tissue air. Midline anterior skin  staples. IMPRESSION: New right knee prosthesis with anatomic alignment. Electronically Signed   By: Natasha Mead M.D.   On: 07/02/2015 14:47    Disposition: [facility] then [facility]       Follow-up Information    Follow up with [facility] .   Specialty:  Skilled Nursing Facility   Contact information:   [address] [place] [address] [phone]      Follow up with Loreta Ave, MD.   Specialty:  Orthopedic Surgery   Why:  As scheduled   Contact information:   [address] [place] [address] [phone]       Signed: Albina Billet III PA-C 07/05/2015, 8:35 AM

## 2015-07-05 NOTE — Progress Notes (Signed)
[facility] She stated that patient related to nursing that she was supposed to receive CPM machine treatments for 21 days.  No order for CPM on d/c summary and they cannot place her on CPM without out. CSW notified Dr. Renaye Rakersim Murphy on call and explained above. He or PA will notify facility to give CPM order.  Notified Janie of above.  No further CSW needs identified. CSW signing off.  Lorri Frederickonna T. Jaci LazierCrowder, LCSW [phone]  (weekend coverage)

## 2015-07-05 NOTE — Clinical Social Work Placement (Signed)
   CLINICAL SOCIAL WORK PLACEMENT  NOTE  Date:  07/05/2015  Patient Details  Name: [name] [name] MRN: [id number] Date of Birth: August 22, 1950  Clinical Social Work is seeking post-discharge placement for this patient at the Skilled  Nursing Facility level of care (*CSW will initial, date and re-position this form in  chart as items are completed):  Yes   Patient/family provided with [facility] Clinical Social Work Department's list of facilities offering this level of care within the geographic area requested by the patient (or if unable, by the patient's family).  Yes   Patient/family informed of their freedom to choose among providers that offer the needed level of care, that participate in Medicare, Medicaid or managed care program needed by the patient, have an available bed and are willing to accept the patient.  Yes   Patient/family informed of [facility]'s ownership interest in [facility] and [facility], as well as of the fact that they are under no obligation to receive care at these facilities.  PASRR submitted to EDS on       PASRR number received on       Existing PASRR number confirmed on 07/03/15     FL2 transmitted to all facilities in geographic area requested by pt/family on 07/03/15     FL2 transmitted to all facilities within larger geographic area on       Patient informed that his/her managed care company has contracts with or will negotiate with certain facilities, including the following:        Yes   Patient/family informed of bed offers received.  Patient chooses bed at [facility]     Physician recommends and patient chooses bed at      [facility] on 07[facility]05/15.  Patient to be transferred to facility by [facility]     Patient family notified on 07/05/15 of transfer.  Name of family member notified:  Patient states she will notify her family once she is "settled" at the facility.      PHYSICIAN Please sign FL2, Please prepare priority discharge summary, including medications, Please prepare prescriptions     Additional Comment: Ok per MD for d/[facility] today [facility].  Patient is pleased with d/c plan. Nursing to call report. DC summary sent to facility for review.  No further CSW needs identified. CSW signing off.    _______________________________________________ Darylene Pricerowder, Jhovanny Guinta T, LCSW 07/05/2015, 2:38 PM

## 2015-07-05 NOTE — Progress Notes (Signed)
Physical Therapy Treatment Patient Details Name: [name] [name] MRN: [id number] DOB: 09/11/1950 Today's Date: 07/05/2015    History of Present Illness Pt is a 65 y.o. female who initially underwent a Rt TKA which became septic and later underwent removal of the components and had a cemented spacer placed. Pt is now s/p total knee revision. PMH: hypertension, diabetes, bilateral TKA.     PT Comments    Pt. Was in CPM at my arrival.  She was agreeable to complete bed exercises but did not want to work on ambulation as she is transferring to SNF today.  Needs min assist for most TKA exercises at this point.    Follow Up Recommendations  SNF;Supervision/Assistance - 24 hour     Equipment Recommendations  None recommended by PT    Recommendations for Other Services       Precautions / Restrictions Precautions Precautions: Knee;Fall Precaution Comments: NO KNEE FLEXION PAST 60 DEGREES. HEP provided, reviewed knee extension precautions.  Restrictions Weight Bearing Restrictions: Yes RLE Weight Bearing: Weight bearing as tolerated    Mobility  Bed Mobility Overal bed mobility:  (pt. declined OOB)                Transfers Overall transfer level:  (pt. declined OOB)                  Ambulation/Gait                 Stairs            Wheelchair Mobility    Modified Rankin (Stroke Patients Only)       Balance                                    Cognition Arousal/Alertness: Awake/alert Behavior During Therapy: WFL for tasks assessed/performed Overall Cognitive Status: Within Functional Limits for tasks assessed                      Exercises Total Joint Exercises Ankle Circles/Pumps: AROM;Both;10 reps Quad Sets: Strengthening;Right;10 reps Short Arc QuadBarbaraann Boys: AAROM;Right;10 reps (min assist for SAQ) Heel Slides: AAROM;Right;10 reps (approximately 30 degrees) Hip ABduction/ADduction: Strengthening;Right;AAROM;10 reps (min  assist)    General Comments        Pertinent Vitals/Pain Pain Assessment: 0-10 Pain Score: 5  Pain Location: right knee Pain Descriptors / Indicators: Aching Pain Intervention(s): Limited activity within patient's tolerance;Monitored during session;Repositioned (pt. in CPM prior to exercises, reapplied CPM end of session)    Home Living                      Prior Function            PT Goals (current goals can now be found in the care plan section) Progress towards PT goals: Progressing toward goals    Frequency  7X/week    PT Plan Current plan remains appropriate    Co-evaluation             End of Session           Time: 1100-1114 PT Time Calculation (min) (ACUTE ONLY): 14 min  Charges:  $Therapeutic Exercise: 8-22 mins                    G Codes:      Ferman HammingBlankenship, [name] [name] 07/05/2015, 11:24 AM Weldon PickingSusan Joy Reiger PT Acute Rehab Services [phone]

## 2015-07-05 NOTE — Progress Notes (Signed)
Orthopedic Tech Progress Note Patient Details:  [name] HarborMyrtle Halabi 08-14-1950 [id number]  CPM Right Knee CPM Right Knee: On Right Knee Flexion (Degrees): 60 Right Knee Extension (Degrees): 0 Additional Comments: trapezebar patient helper   Saul FordyceJennifer C Ritchie Klee 07/05/2015, 12:36 PM

## 2015-07-05 NOTE — Care Management Important Message (Signed)
Important Message  Patient Details  Name: [name] [name] MRN: [id number] Date of Birth: [birth year]   Medicare Important Message Given:  Yes    Durenda GuthrieBrady, Arpita Fentress Naomi, RN 07/05/2015, 9:22 AM

## 2015-07-11 DIAGNOSIS — E782 Mixed hyperlipidemia: Secondary | ICD-10-CM | POA: Diagnosis not present

## 2015-07-11 DIAGNOSIS — I10 Essential (primary) hypertension: Secondary | ICD-10-CM | POA: Diagnosis not present

## 2015-07-11 DIAGNOSIS — M00061 Staphylococcal arthritis, right knee: Secondary | ICD-10-CM | POA: Diagnosis not present

## 2015-07-11 DIAGNOSIS — E119 Type 2 diabetes mellitus without complications: Secondary | ICD-10-CM | POA: Diagnosis not present

## 2015-07-11 DIAGNOSIS — M1990 Unspecified osteoarthritis, unspecified site: Secondary | ICD-10-CM | POA: Diagnosis not present

## 2015-07-11 DIAGNOSIS — Z96651 Presence of right artificial knee joint: Secondary | ICD-10-CM | POA: Diagnosis not present

## 2015-07-15 DIAGNOSIS — T8453XD Infection and inflammatory reaction due to internal right knee prosthesis, subsequent encounter: Secondary | ICD-10-CM | POA: Diagnosis not present

## 2015-07-25 ENCOUNTER — Encounter (HOSPITAL_COMMUNITY): Payer: Self-pay | Admitting: Orthopedic Surgery

## 2015-07-29 ENCOUNTER — Encounter (HOSPITAL_COMMUNITY): Payer: Self-pay | Admitting: Orthopedic Surgery

## 2015-08-05 DIAGNOSIS — M25661 Stiffness of right knee, not elsewhere classified: Secondary | ICD-10-CM | POA: Diagnosis not present

## 2015-08-05 DIAGNOSIS — M25561 Pain in right knee: Secondary | ICD-10-CM | POA: Diagnosis not present

## 2015-08-05 DIAGNOSIS — Z96651 Presence of right artificial knee joint: Secondary | ICD-10-CM | POA: Diagnosis not present

## 2015-08-11 DIAGNOSIS — M25661 Stiffness of right knee, not elsewhere classified: Secondary | ICD-10-CM | POA: Diagnosis not present

## 2015-08-11 DIAGNOSIS — Z96651 Presence of right artificial knee joint: Secondary | ICD-10-CM | POA: Diagnosis not present

## 2015-08-11 DIAGNOSIS — M25561 Pain in right knee: Secondary | ICD-10-CM | POA: Diagnosis not present

## 2015-08-12 DIAGNOSIS — T8453XD Infection and inflammatory reaction due to internal right knee prosthesis, subsequent encounter: Secondary | ICD-10-CM | POA: Diagnosis not present

## 2015-08-13 DIAGNOSIS — M25561 Pain in right knee: Secondary | ICD-10-CM | POA: Diagnosis not present

## 2015-08-13 DIAGNOSIS — M25661 Stiffness of right knee, not elsewhere classified: Secondary | ICD-10-CM | POA: Diagnosis not present

## 2015-08-13 DIAGNOSIS — Z96651 Presence of right artificial knee joint: Secondary | ICD-10-CM | POA: Diagnosis not present

## 2015-08-18 ENCOUNTER — Telehealth: Payer: Self-pay

## 2015-08-18 NOTE — Telephone Encounter (Signed)
Refill on furosemide 20 mg. I did not see this on her list She states Dr. Clelia CroftShaw sent this in for her. She needs to get the fluid off her legs and thinks she needs to go to 40 mg. Please advise.  [phone]

## 2015-08-20 DIAGNOSIS — M25561 Pain in right knee: Secondary | ICD-10-CM | POA: Diagnosis not present

## 2015-08-20 DIAGNOSIS — Z96651 Presence of right artificial knee joint: Secondary | ICD-10-CM | POA: Diagnosis not present

## 2015-08-20 DIAGNOSIS — M25661 Stiffness of right knee, not elsewhere classified: Secondary | ICD-10-CM | POA: Diagnosis not present

## 2015-08-22 DIAGNOSIS — Z96651 Presence of right artificial knee joint: Secondary | ICD-10-CM | POA: Diagnosis not present

## 2015-08-22 DIAGNOSIS — M25561 Pain in right knee: Secondary | ICD-10-CM | POA: Diagnosis not present

## 2015-08-22 DIAGNOSIS — M25661 Stiffness of right knee, not elsewhere classified: Secondary | ICD-10-CM | POA: Diagnosis not present

## 2015-08-26 DIAGNOSIS — M25661 Stiffness of right knee, not elsewhere classified: Secondary | ICD-10-CM | POA: Diagnosis not present

## 2015-08-26 DIAGNOSIS — M25561 Pain in right knee: Secondary | ICD-10-CM | POA: Diagnosis not present

## 2015-08-26 DIAGNOSIS — Z96651 Presence of right artificial knee joint: Secondary | ICD-10-CM | POA: Diagnosis not present

## 2015-08-28 DIAGNOSIS — M25661 Stiffness of right knee, not elsewhere classified: Secondary | ICD-10-CM | POA: Diagnosis not present

## 2015-08-28 DIAGNOSIS — Z96651 Presence of right artificial knee joint: Secondary | ICD-10-CM | POA: Diagnosis not present

## 2015-08-28 DIAGNOSIS — M25561 Pain in right knee: Secondary | ICD-10-CM | POA: Diagnosis not present

## 2015-08-29 NOTE — Telephone Encounter (Signed)
Patient is calling to follow up on refill request. She states that she hasn't heard anything. Please advise! [phone]  [facility] [address]

## 2015-09-01 DIAGNOSIS — Z96651 Presence of right artificial knee joint: Secondary | ICD-10-CM | POA: Diagnosis not present

## 2015-09-01 DIAGNOSIS — M25561 Pain in right knee: Secondary | ICD-10-CM | POA: Diagnosis not present

## 2015-09-01 DIAGNOSIS — M25661 Stiffness of right knee, not elsewhere classified: Secondary | ICD-10-CM | POA: Diagnosis not present

## 2015-09-02 NOTE — Telephone Encounter (Signed)
I do not see furosemide in the record.  Route to Dr. Clelia CroftShaw.

## 2015-09-03 DIAGNOSIS — Z96651 Presence of right artificial knee joint: Secondary | ICD-10-CM | POA: Diagnosis not present

## 2015-09-03 DIAGNOSIS — M25561 Pain in right knee: Secondary | ICD-10-CM | POA: Diagnosis not present

## 2015-09-03 DIAGNOSIS — M25661 Stiffness of right knee, not elsewhere classified: Secondary | ICD-10-CM | POA: Diagnosis not present

## 2015-09-03 NOTE — Telephone Encounter (Signed)
I think pt must have the wrong Dr. Clelia CroftShaw - there are Dr. Alver FisherShaw's in primary care at [facility] and [facility].  I have never seen this pt or prescribed her anything.  She has [facility] and she saw Dr. Paulina FusiHess (sports med fellow) at that time.  She has never been prescribed lasix/furosemide in Epic in the past 4 yrs. She needs to f/u with her PCP for this.

## 2015-09-03 NOTE — Telephone Encounter (Signed)
Left VM informing pt of the mix up and stated if she has further questions to call us back asap

## 2015-09-05 ENCOUNTER — Other Ambulatory Visit: Payer: Self-pay | Admitting: Physician Assistant

## 2015-09-08 MED ORDER — AMLODIPINE BESYLATE 5 MG PO TABS: 5.0000 mg | ORAL_TABLET | Freq: Every day | ORAL | 0 refills | Status: DC

## 2015-09-08 MED ORDER — ATORVASTATIN CALCIUM 40 MG PO TABS: 40.0000 mg | ORAL_TABLET | Freq: Every morning | ORAL | 0 refills | Status: DC

## 2015-09-09 DIAGNOSIS — M25661 Stiffness of right knee, not elsewhere classified: Secondary | ICD-10-CM | POA: Diagnosis not present

## 2015-09-09 DIAGNOSIS — Z96651 Presence of right artificial knee joint: Secondary | ICD-10-CM | POA: Diagnosis not present

## 2015-09-09 DIAGNOSIS — M25561 Pain in right knee: Secondary | ICD-10-CM | POA: Diagnosis not present

## 2015-09-11 ENCOUNTER — Telehealth: Payer: Self-pay

## 2015-09-11 ENCOUNTER — Other Ambulatory Visit: Payer: Self-pay | Admitting: Physician Assistant

## 2015-09-11 DIAGNOSIS — M25661 Stiffness of right knee, not elsewhere classified: Secondary | ICD-10-CM | POA: Diagnosis not present

## 2015-09-11 DIAGNOSIS — M25561 Pain in right knee: Secondary | ICD-10-CM | POA: Diagnosis not present

## 2015-09-11 DIAGNOSIS — Z96651 Presence of right artificial knee joint: Secondary | ICD-10-CM | POA: Diagnosis not present

## 2015-09-11 NOTE — Telephone Encounter (Signed)
Pt would like for [place] to switch her valsartan (DIOVAN) 320 MG tablet [[id number]] from 320mg  to 160mg . She states she doesn't need to be on this any more because she has had her surgery. Also, the 320mg  pill is to hard to swallow. She likes the size of the 160mg  pill. [facility] - [place], [place] - [address]. Please advise at [phone]

## 2015-09-12 MED ORDER — VALSARTAN 320 MG PO TABS: 320.0000 mg | ORAL_TABLET | Freq: Every day | ORAL | 0 refills | Status: DC

## 2015-09-12 NOTE — Telephone Encounter (Signed)
   09/11/15 3:13 PM  Note    Pt would like for [place] to switch her valsartan (DIOVAN) 320 MG tablet [[id number]] from 320mg  to 160mg . She states she doesn't need to be on this any more because she has had her surgery. Also, the 320mg  pill is to hard to swallow. She likes the size of the 160mg  pill. [facility] - [place], [place] - [address]. Please advise at [phone]     This was sent yesterday patient called.

## 2015-09-12 NOTE — Telephone Encounter (Signed)
We have seen her once for pre operative clearance roughly 3 months ago and she was seen by an orthopedic sports fellow.  If she is going to be using [facility] for medication refills and primary care she needs to come in.  Deliah Boston, MS, PA-C 10:48 AM, 09/12/2015

## 2015-09-12 NOTE — Telephone Encounter (Signed)
Called pt and advised that we will need to see her to make any medication changes. She stated that she has had her knee surgery and is healing right now. The bus she took for transportation does not normally drop off at our office, so she will have to wait until she can get around a different way to come back. Discussed need for PCP and agreed to RF her valsartan at same dose  (amlodipine was just RFd this week for 90 days). Pt understood that she needs a f/up visit before these refills run out.

## 2015-09-18 DIAGNOSIS — M25661 Stiffness of right knee, not elsewhere classified: Secondary | ICD-10-CM | POA: Diagnosis not present

## 2015-09-18 DIAGNOSIS — M25561 Pain in right knee: Secondary | ICD-10-CM | POA: Diagnosis not present

## 2015-09-18 DIAGNOSIS — Z96651 Presence of right artificial knee joint: Secondary | ICD-10-CM | POA: Diagnosis not present

## 2015-09-23 DIAGNOSIS — T8453XD Infection and inflammatory reaction due to internal right knee prosthesis, subsequent encounter: Secondary | ICD-10-CM | POA: Diagnosis not present

## 2015-09-23 DIAGNOSIS — M25661 Stiffness of right knee, not elsewhere classified: Secondary | ICD-10-CM | POA: Diagnosis not present

## 2015-09-23 DIAGNOSIS — M25561 Pain in right knee: Secondary | ICD-10-CM | POA: Diagnosis not present

## 2015-09-23 DIAGNOSIS — Z96651 Presence of right artificial knee joint: Secondary | ICD-10-CM | POA: Diagnosis not present

## 2015-09-25 DIAGNOSIS — M25661 Stiffness of right knee, not elsewhere classified: Secondary | ICD-10-CM | POA: Diagnosis not present

## 2015-09-25 DIAGNOSIS — M25561 Pain in right knee: Secondary | ICD-10-CM | POA: Diagnosis not present

## 2015-09-25 DIAGNOSIS — Z96651 Presence of right artificial knee joint: Secondary | ICD-10-CM | POA: Diagnosis not present

## 2015-09-30 DIAGNOSIS — Z96651 Presence of right artificial knee joint: Secondary | ICD-10-CM | POA: Diagnosis not present

## 2015-09-30 DIAGNOSIS — M25661 Stiffness of right knee, not elsewhere classified: Secondary | ICD-10-CM | POA: Diagnosis not present

## 2015-09-30 DIAGNOSIS — M25561 Pain in right knee: Secondary | ICD-10-CM | POA: Diagnosis not present

## 2015-10-02 DIAGNOSIS — Z96651 Presence of right artificial knee joint: Secondary | ICD-10-CM | POA: Diagnosis not present

## 2015-10-02 DIAGNOSIS — M25661 Stiffness of right knee, not elsewhere classified: Secondary | ICD-10-CM | POA: Diagnosis not present

## 2015-10-02 DIAGNOSIS — M25561 Pain in right knee: Secondary | ICD-10-CM | POA: Diagnosis not present

## 2015-10-06 DIAGNOSIS — M25661 Stiffness of right knee, not elsewhere classified: Secondary | ICD-10-CM | POA: Diagnosis not present

## 2015-10-06 DIAGNOSIS — M25561 Pain in right knee: Secondary | ICD-10-CM | POA: Diagnosis not present

## 2015-10-06 DIAGNOSIS — Z96651 Presence of right artificial knee joint: Secondary | ICD-10-CM | POA: Diagnosis not present

## 2015-10-08 DIAGNOSIS — M25661 Stiffness of right knee, not elsewhere classified: Secondary | ICD-10-CM | POA: Diagnosis not present

## 2015-10-08 DIAGNOSIS — Z96651 Presence of right artificial knee joint: Secondary | ICD-10-CM | POA: Diagnosis not present

## 2015-10-08 DIAGNOSIS — M25561 Pain in right knee: Secondary | ICD-10-CM | POA: Diagnosis not present

## 2015-10-14 DIAGNOSIS — M25561 Pain in right knee: Secondary | ICD-10-CM | POA: Diagnosis not present

## 2015-10-14 DIAGNOSIS — M25661 Stiffness of right knee, not elsewhere classified: Secondary | ICD-10-CM | POA: Diagnosis not present

## 2015-10-14 DIAGNOSIS — Z96651 Presence of right artificial knee joint: Secondary | ICD-10-CM | POA: Diagnosis not present

## 2015-10-16 DIAGNOSIS — M25561 Pain in right knee: Secondary | ICD-10-CM | POA: Diagnosis not present

## 2015-10-16 DIAGNOSIS — M25661 Stiffness of right knee, not elsewhere classified: Secondary | ICD-10-CM | POA: Diagnosis not present

## 2015-10-16 DIAGNOSIS — Z96651 Presence of right artificial knee joint: Secondary | ICD-10-CM | POA: Diagnosis not present

## 2015-10-21 DIAGNOSIS — M25661 Stiffness of right knee, not elsewhere classified: Secondary | ICD-10-CM | POA: Diagnosis not present

## 2015-10-21 DIAGNOSIS — Z96651 Presence of right artificial knee joint: Secondary | ICD-10-CM | POA: Diagnosis not present

## 2015-10-21 DIAGNOSIS — M25561 Pain in right knee: Secondary | ICD-10-CM | POA: Diagnosis not present

## 2015-10-23 DIAGNOSIS — Z96651 Presence of right artificial knee joint: Secondary | ICD-10-CM | POA: Diagnosis not present

## 2015-10-23 DIAGNOSIS — M25661 Stiffness of right knee, not elsewhere classified: Secondary | ICD-10-CM | POA: Diagnosis not present

## 2015-10-23 DIAGNOSIS — M25561 Pain in right knee: Secondary | ICD-10-CM | POA: Diagnosis not present

## 2015-10-27 DIAGNOSIS — M545 Low back pain: Secondary | ICD-10-CM | POA: Diagnosis not present

## 2015-10-27 DIAGNOSIS — M25661 Stiffness of right knee, not elsewhere classified: Secondary | ICD-10-CM | POA: Diagnosis not present

## 2015-10-27 DIAGNOSIS — M25561 Pain in right knee: Secondary | ICD-10-CM | POA: Diagnosis not present

## 2015-10-27 DIAGNOSIS — Z96651 Presence of right artificial knee joint: Secondary | ICD-10-CM | POA: Diagnosis not present

## 2015-10-31 DIAGNOSIS — M545 Low back pain: Secondary | ICD-10-CM | POA: Diagnosis not present

## 2015-10-31 DIAGNOSIS — M25561 Pain in right knee: Secondary | ICD-10-CM | POA: Diagnosis not present

## 2015-10-31 DIAGNOSIS — M25661 Stiffness of right knee, not elsewhere classified: Secondary | ICD-10-CM | POA: Diagnosis not present

## 2015-10-31 DIAGNOSIS — Z96651 Presence of right artificial knee joint: Secondary | ICD-10-CM | POA: Diagnosis not present

## 2015-11-04 DIAGNOSIS — M25561 Pain in right knee: Secondary | ICD-10-CM | POA: Diagnosis not present

## 2015-11-04 DIAGNOSIS — Z96651 Presence of right artificial knee joint: Secondary | ICD-10-CM | POA: Diagnosis not present

## 2015-11-04 DIAGNOSIS — M25661 Stiffness of right knee, not elsewhere classified: Secondary | ICD-10-CM | POA: Diagnosis not present

## 2015-11-04 DIAGNOSIS — M545 Low back pain: Secondary | ICD-10-CM | POA: Diagnosis not present

## 2015-11-06 DIAGNOSIS — M25561 Pain in right knee: Secondary | ICD-10-CM | POA: Diagnosis not present

## 2015-11-06 DIAGNOSIS — M25661 Stiffness of right knee, not elsewhere classified: Secondary | ICD-10-CM | POA: Diagnosis not present

## 2015-11-06 DIAGNOSIS — M545 Low back pain: Secondary | ICD-10-CM | POA: Diagnosis not present

## 2015-11-06 DIAGNOSIS — Z96651 Presence of right artificial knee joint: Secondary | ICD-10-CM | POA: Diagnosis not present

## 2015-11-11 DIAGNOSIS — M25661 Stiffness of right knee, not elsewhere classified: Secondary | ICD-10-CM | POA: Diagnosis not present

## 2015-11-11 DIAGNOSIS — M545 Low back pain: Secondary | ICD-10-CM | POA: Diagnosis not present

## 2015-11-11 DIAGNOSIS — Z96651 Presence of right artificial knee joint: Secondary | ICD-10-CM | POA: Diagnosis not present

## 2015-11-11 DIAGNOSIS — M25561 Pain in right knee: Secondary | ICD-10-CM | POA: Diagnosis not present

## 2015-11-14 DIAGNOSIS — M545 Low back pain: Secondary | ICD-10-CM | POA: Diagnosis not present

## 2015-11-14 DIAGNOSIS — Z96651 Presence of right artificial knee joint: Secondary | ICD-10-CM | POA: Diagnosis not present

## 2015-11-14 DIAGNOSIS — M25661 Stiffness of right knee, not elsewhere classified: Secondary | ICD-10-CM | POA: Diagnosis not present

## 2015-11-14 DIAGNOSIS — M25561 Pain in right knee: Secondary | ICD-10-CM | POA: Diagnosis not present

## 2015-11-19 DIAGNOSIS — M25561 Pain in right knee: Secondary | ICD-10-CM | POA: Diagnosis not present

## 2015-11-19 DIAGNOSIS — M25661 Stiffness of right knee, not elsewhere classified: Secondary | ICD-10-CM | POA: Diagnosis not present

## 2015-11-19 DIAGNOSIS — Z96651 Presence of right artificial knee joint: Secondary | ICD-10-CM | POA: Diagnosis not present

## 2015-11-19 DIAGNOSIS — M545 Low back pain: Secondary | ICD-10-CM | POA: Diagnosis not present

## 2015-11-21 ENCOUNTER — Ambulatory Visit (INDEPENDENT_AMBULATORY_CARE_PROVIDER_SITE_OTHER): Payer: Medicare Other | Admitting: Family Medicine

## 2015-11-21 VITALS — BP 175/77 | HR 106 | Temp 98.2°F | Resp 16 | Ht 64.0 in | Wt 308.0 lb

## 2015-11-21 DIAGNOSIS — I10 Essential (primary) hypertension: Secondary | ICD-10-CM | POA: Diagnosis not present

## 2015-11-21 DIAGNOSIS — E782 Mixed hyperlipidemia: Secondary | ICD-10-CM | POA: Diagnosis not present

## 2015-11-21 DIAGNOSIS — R0609 Other forms of dyspnea: Secondary | ICD-10-CM | POA: Diagnosis not present

## 2015-11-21 DIAGNOSIS — Z6841 Body Mass Index (BMI) 40.0 and over, adult: Secondary | ICD-10-CM | POA: Diagnosis not present

## 2015-11-21 DIAGNOSIS — R0789 Other chest pain: Secondary | ICD-10-CM

## 2015-11-21 DIAGNOSIS — E669 Obesity, unspecified: Secondary | ICD-10-CM | POA: Diagnosis not present

## 2015-11-21 DIAGNOSIS — R000 Tachycardia, unspecified: Secondary | ICD-10-CM

## 2015-11-21 DIAGNOSIS — IMO0001 Reserved for inherently not codable concepts without codable children: Secondary | ICD-10-CM

## 2015-11-21 MED ORDER — AMLODIPINE BESYLATE 5 MG PO TABS: 5.0000 mg | ORAL_TABLET | Freq: Every day | ORAL | 0 refills | Status: DC

## 2015-11-21 MED ORDER — ATORVASTATIN CALCIUM 40 MG PO TABS: 40.0000 mg | ORAL_TABLET | Freq: Every morning | ORAL | 0 refills | Status: DC

## 2015-11-21 MED ORDER — VALSARTAN 320 MG PO TABS: 320.0000 mg | ORAL_TABLET | Freq: Every day | ORAL | 0 refills | Status: DC

## 2015-11-21 NOTE — Progress Notes (Addendum)
Subjective:  By signing my name below, I, [name] [name], attest that this documentation has been prepared under the direction and in the presence of [name] Staggers, MD. Electronically Signed: Stann [name], Scribe. 11/21/2015 , 1:41 PM .  Patient was seen in Room 1 .   Patient ID: [name] [name], female    DOB: 15-Jul-1950, 65 y.o.   MRN: [id number] Chief Complaint  Patient presents with  . other    pt was reffered to see Dr. Neva Seat pt. said needs EKG by Elizabeth Palau, therapist   HPI [name] [name] is a 65 y.o. female  Here for evaluation for possible EKG. H/o prosthetic knee from knee arthroscopy with right knee pain in May requiring ER visit. She had total knee revision of her right knee with subsequent infection requiring operation on May 17th. She was referred to me from Elizabeth Palau, physical therapist, at [facility]. She was switched from Bystolic to Norvasc and continued on valsartan in April with Dr. Paulina Fusi. Planned to follow up in 3~4 weeks, but hasn't been back since.   Letter from physical therapist: patient had some more shortness of breath with walking, and history of hypertension. Her BP during session 2 days ago was up to 183/89. She had normal O2 sat, and did not change ambulating 100 feet. Her heart rate does go up from 120 to 135.   Patient states she hasn't been walking for the past 3 years. So recently, she's started walking again, and getting short of breath. She hasn't been taking her BP medications or cholesterol medications in over 3 weeks now. She's been checking her BP at home, ranging in 157~161. She's been having persistent right leg swelling since her surgery in May. She's been seen by cardiologist, Dr. Sherril Croon in the past. She does note having some chest tightness when she lays down supine.   She denies having chest pain or tightness when it occurs. She denies any new calf swelling. She denies history of DVT's or blood clots. She denies any recent long distance  car travel or air travel. She denies history of heart problems.   Patient Active Problem List   Diagnosis Date Noted  . 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 Protein-calorie malnutrition (HCC) 11/15/2014  . Osteomyelitis of right knee region (HCC) 11/08/2014  . Complication of device 11/04/2014  . Septic arthritis (HCC) 11/04/2014  . S/P right knee arthroscopy   . UTI (urinary tract infection) 12/09/2012  . 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 Hyperlipidemia   . Hypertension    Past Medical History:  Diagnosis Date  . Arthritis    "right knee" (07/02/2015)  . Borderline diabetes   . Hyperlipidemia   . Hypertension   . Overactive bladder   . S/P right knee arthroscopy    with septic joint growing out MSSA  . Septic arthritis of knee, right (HCC)   . Wears glasses    Past Surgical History:  Procedure Laterality Date  . COLONOSCOPY    . DIAGNOSTIC LAPAROSCOPY    . JOINT REPLACEMENT    . KNEE ARTHROSCOPY Right 12/09/2012   Procedure: ARTHROSCOPIC IRRIGATION AND DEBRIDEMENT RIGHT KNEE;  Surgeon: Budd Palmer, MD;  Location: [facility] OR;  Service: Orthopedics;  Laterality: Right;  . 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/06/2014   Procedure: PLACEMENT OF CEMENT SPACER;  Surgeon: Loreta Ave, MD;  Location[facility] OR;  Service: Orthopedics;  Laterality: Right;  . TOTAL KNEE ARTHROPLASTY Bilateral 2011-2012  left-right  . TOTAL KNEE REVISION Right 11/06/2014   Procedure: REMOVAL OF TOTAL COMPONENTS, EXTENSIVE  IRRIGATION AND DEBRIDEMENT  RIGHT KNEE;  Surgeon: Loreta Ave, MD;  [facility];  Service: Orthopedics;  Laterality: Right;  . TOTAL KNEE REVISION Right 07/02/2015  . TOTAL KNEE REVISION Right 07/02/2015   Procedure: TOTAL KNEE REVISION;  Surgeon: Loreta Ave, MD;  Location: [facility] OR;  Service: Orthopedics;  Laterality: Right;  . TUBAL LIGATION  ~ 1980   No Known Allergies Prior to Admission medications   Not on File   Social History    Social History  . Marital status: Divorced    Spouse name: N/A  . Number of children: N/A  . Years of education: N/A   Occupational History  . Not on file.   Social History Main Topics  . Smoking status: Former Smoker    Packs/day: 0.25    Years: 23.00    Types: Cigarettes    Quit date: 08/01/1992  . Smokeless tobacco: Never Used  . Alcohol use No  . Drug use: No  . Sexual activity: No   Other Topics Concern  . Not on file   Social History Narrative  . No narrative on file   Review of Systems  Constitutional: Negative for chills, fatigue and fever.  Respiratory: Positive for chest tightness and shortness of breath. Negative for wheezing.   Cardiovascular: Positive for leg swelling. Negative for chest pain and palpitations.  Musculoskeletal: Positive for arthralgias, gait problem and myalgias.  Skin: Negative for rash and wound.       Objective:   Physical Exam  Constitutional: She is oriented to person, place, and time. She appears well-developed and well-nourished.  HENT:  Head: Normocephalic and atraumatic.  Eyes: Conjunctivae and EOM are normal. Pupils are equal, round, and reactive to light.  Neck: Carotid bruit is not present.  Cardiovascular: Regular rhythm, normal heart sounds and intact distal pulses.  Tachycardia present.   Heart sounds distant but regular, mildly tachycardic  Pulmonary/Chest: Effort normal and breath sounds normal.  Abdominal: Soft. She exhibits no pulsatile midline mass. There is no tenderness.  Musculoskeletal:  Right lower extremity: some edema; well healed scar over right knee Left lower extremity: trace edema, less than right  Neurological: She is alert and oriented to person, place, and time.  Skin: Skin is warm and dry.  Psychiatric: She has a normal mood and affect. Her behavior is normal.  Vitals reviewed.  EKG: sinus rhythm, nonspecific T abnormality with flat T waves V3 through V6. No significant changes from previous,  no  other acute findings.  Vitals:   11/21/15 1213  BP: (!) 175/77  Pulse: (!) 106  Resp: 16  Temp: 98.2 F (36.8 C)  TempSrc: Oral  SpO2: 97%  Weight: (!) 308 lb (139.7 kg)  Height: 5\' 4"  (1.626 m)   Body mass index is 52.87 kg/m.     Assessment & Plan:   Katarzyna Wolven is a 65 y.o. female Essential hypertension - Plan: amLODipine (NORVASC) 5 MG tablet, atorvastatin (LIPITOR) 40 MG tablet, valsartan (DIOVAN) 320 MG tablet, EKG 12-Lead, Comprehensive metabolic panel, Lipid panel, CANCELED: COMPLETE METABOLIC PANEL WITH GFR, CANCELED: Lipid panel  Tachycardia - Plan: EKG 12-Lead, D-dimer, quantitative (not at [facility]), CBC, CANCELED: CBC with Differential/Platelet, CANCELED: D-dimer, quantitative (not at [facility])  DOE (dyspnea on exertion) - Plan: EKG 12-Lead, Ambulatory referral to Cardiology, D-dimer, quantitative (not at [facility]), CBC, CANCELED: CBC with Differential/Platelet, CANCELED: D-dimer, quantitative (not at [facility])  Chest tightness - Plan: Ambulatory referral to Cardiology, CANCELED: D-dimer, quantitative (not at [facility])  Mixed hyperlipidemia - Plan: D-dimer, quantitative (not at [facility]), CANCELED: Lipid panel  Class 3 obesity with body mass index (BMI) of 50.0 to 59.9 in adult, unspecified obesity type, unspecified whether serious comorbidity present (HCC)  History of obesity, hypertension, hyperlipidemia. Recently nonadherent to medications and elevated blood pressure and DOE while at physical therapy, with rare upper chest tightness with lying down. No signs of CHF on exam, no acute findings on EKG, and no calf pain or apparent discrepancy in calf size in office   -differential diagnosis includes deconditioning, less likely cardiac disease, or less likely DVT/PE with previous surgery. She does have cardiac risk factors of hypertension and hyperlipidemia as well as medication nonadherence with elevated pressure in office.  -Check d-dimer, CBC, lytes.    -Restart previous  antihypertensives as well as Lipitor. If new side effects with these medications, advised to return right away so we can discuss those and change if needed. I one week to determine control  -Refer to cardiology for evaluation, but for now avoid exertional activities that have created her dyspnea on the past. ER/RTC/91 precautions given if chest pain or dyspnea worsens.  Note typed for PT.   Meds ordered this encounter  Medications  . amLODipine (NORVASC) 5 MG tablet    Sig: Take 1 tablet (5 mg total) by mouth daily.    Dispense:  90 tablet    Refill:  0  . atorvastatin (LIPITOR) 40 MG tablet    Sig: Take 1 tablet (40 mg total) by mouth every morning.    Dispense:  90 tablet    Refill:  0  . valsartan (DIOVAN) 320 MG tablet    Sig: Take 1 tablet (320 mg total) by mouth daily.    Dispense:  90 tablet    Refill:  0   Patient Instructions    Unfortunately there can be many causes of your shortness of breath. As you are off your medications, your blood pressure is elevated, and heart cause is possible. Deconditioning or being out of shape can also cause these symptoms, but heart cause is also possible.  Restart your to blood pressure medications as well as your cholesterol medication and follow-up with me in the next 1 week to see if levels are better. Sooner if any new side effects. I  will also check a test for blood clots, and if that is elevated, you may need a chest CT to rule out blood clots with your previous surgery.   Additionally I will refer you to a cardiologist to evaluate for possible heart disease causing your shortness of breath. For now avoid activities that cause your shortness of breath until you have been evaluated by cardiology and other testing has been performed.  Return to the clinic or go to the nearest emergency room if any of your symptoms worsen or new symptoms occur.     IF you received an x-ray today, you will receive an invoice from [facility].  Please contact [facility] at [phone] with questions or concerns regarding your invoice.   IF you received labwork today, you will receive an invoice from [employer]. Please contact [facility] at [phone] with questions or concerns regarding your invoice.   Our billing staff will not be able to assist you with questions regarding bills from these companies.  You will be contacted with the lab results as soon as they are available.  The fastest way to get your results is to activate your My Chart account. Instructions are located on the last page of this paperwork. If you have not heard from us regarding the results in 2 weeks, please contact this office.        I personally performed the services described in this documentation, which was scribed in my presence. The recorded information has been reviewed and considered, and addended by me as needed.   Signed,   [name] StaggersJeffrey Abbigayle Toole, MD [facility].  11/23/15 6:03 PM

## 2015-11-21 NOTE — Patient Instructions (Addendum)
  Unfortunately there can be many causes of your shortness of breath. As you are off your medications, your blood pressure is elevated, and heart cause is possible. Deconditioning or being out of shape can also cause these symptoms, but heart cause is also possible.  Restart your to blood pressure medications as well as your cholesterol medication and follow-up with me in the next 1 week to see if levels are better. Sooner if any new side effects. I  will also check a test for blood clots, and if that is elevated, you may need a chest CT to rule out blood clots with your previous surgery.   Additionally I will refer you to a cardiologist to evaluate for possible heart disease causing your shortness of breath. For now avoid activities that cause your shortness of breath until you have been evaluated by cardiology and other testing has been performed.  Return to the clinic or go to the nearest emergency room if any of your symptoms worsen or new symptoms occur.     IF you received an x-ray today, you will receive an invoice from [facility]. Please contact [facility] at [phone] with questions or concerns regarding your invoice.   IF you received labwork today, you will receive an invoice from [facility]. Please contact [facility] at [phone] with questions or concerns regarding your invoice.   Our billing staff will not be able to assist you with questions regarding bills from these companies.  You will be contacted with the lab results as soon as they are available. The fastest way to get your results is to activate your My Chart account. Instructions are located on the last page of this paperwork. If you have not heard from us regarding the results in 2 weeks, please contact this office.

## 2015-11-24 ENCOUNTER — Telehealth: Payer: Self-pay | Admitting: Emergency Medicine

## 2015-11-24 ENCOUNTER — Telehealth: Payer: Self-pay | Admitting: Family Medicine

## 2015-11-24 NOTE — Telephone Encounter (Signed)
Pt is scheduled for f/u visit with Dr. Neva SeatGreene on Wednesday @ 1130am. Will have blood work drawn at that time. States, her breathing is better. Noticed SOB when walking upstairs on Saturday but improved. Instructed if sob worsens, come to clinic or ER.

## 2015-11-24 NOTE — Telephone Encounter (Signed)
Please call patient and check status. As we discussed at last visit, I was concerned about multiple causes of her shortness of breath, including to check blood work to make sure she does not have a blood clot in her lungs. She can return here today try to have blood drawn again or to [facility] location as discussed at last visit as these are important tests.

## 2015-11-26 ENCOUNTER — Ambulatory Visit (INDEPENDENT_AMBULATORY_CARE_PROVIDER_SITE_OTHER): Payer: Medicare Other

## 2015-11-26 ENCOUNTER — Ambulatory Visit (INDEPENDENT_AMBULATORY_CARE_PROVIDER_SITE_OTHER): Payer: Medicare Other | Admitting: Family Medicine

## 2015-11-26 VITALS — BP 169/86 | HR 104 | Temp 98.0°F | Resp 16 | Ht 64.0 in | Wt 307.2 lb

## 2015-11-26 DIAGNOSIS — I10 Essential (primary) hypertension: Secondary | ICD-10-CM | POA: Diagnosis not present

## 2015-11-26 DIAGNOSIS — R0602 Shortness of breath: Secondary | ICD-10-CM | POA: Diagnosis not present

## 2015-11-26 DIAGNOSIS — R0609 Other forms of dyspnea: Secondary | ICD-10-CM

## 2015-11-26 DIAGNOSIS — R0789 Other chest pain: Secondary | ICD-10-CM | POA: Diagnosis not present

## 2015-11-26 MED ORDER — NEBIVOLOL HCL 10 MG PO TABS: 10.0000 mg | ORAL_TABLET | Freq: Every day | ORAL | 2 refills | Status: DC

## 2015-11-26 MED ORDER — VALSARTAN 160 MG PO TABS: 160.0000 mg | ORAL_TABLET | Freq: Every day | ORAL | 2 refills | Status: DC

## 2015-11-26 NOTE — Patient Instructions (Addendum)
Okay to stop amlodipine for now, and restart bysystolic. I sent in a prescription for you. I also changed your losartan back to  160 mg as that had worked better for you in the past. Keep an eye on your blood pressures outside of the office, and recheck in 1 week. If pressures remain over 160/100, be seen here or the emergency room. We can discuss any other changes to your regimen next week.  We will check blood work today to look into other causes for your shortness of breath, but follow-up with cardiology as planned. If you have any chest pains, or worsening shortness of breath, call 911 or go to emergency room.   Your chest symptoms with lying down at night may be due to acid reflux or heartburn. You can try Zantac over-the-counter once per day. Follow-up with me to discuss this further next week.  Return to the clinic or go to the nearest emergency room if any of your symptoms worsen or new symptoms occur.   Hypertension Hypertension, commonly called high blood pressure, is when the force of blood pumping through your arteries is too strong. Your arteries are the blood vessels that carry blood from your heart throughout your body. A blood pressure reading consists of a higher number over a lower number, such as 110/72. The higher number (systolic) is the pressure inside your arteries when your heart pumps. The lower number (diastolic) is the pressure inside your arteries when your heart relaxes. Ideally you want your blood pressure below 120/80. Hypertension forces your heart to work harder to pump blood. Your arteries may become narrow or stiff. Having untreated or uncontrolled hypertension can cause heart attack, stroke, kidney disease, and other problems. RISK FACTORS Some risk factors for high blood pressure are controllable. Others are not.  Risk factors you cannot control include:   Race. You may be at higher risk if you are African American.  Age. Risk increases with age.  Gender. Men  are at higher risk than women before age 5 years. After age 101, women are at higher risk than men. Risk factors you can control include:  Not getting enough exercise or physical activity.  Being overweight.  Getting too much fat, sugar, calories, or salt in your diet.  Drinking too much alcohol. SIGNS AND SYMPTOMS Hypertension does not usually cause signs or symptoms. Extremely high blood pressure (hypertensive crisis) may cause headache, anxiety, shortness of breath, and nosebleed. DIAGNOSIS To check if you have hypertension, your health care provider will measure your blood pressure while you are seated, with your arm held at the level of your heart. It should be measured at least twice using the same arm. Certain conditions can cause a difference in blood pressure between your right and left arms. A blood pressure reading that is higher than normal on one occasion does not mean that you need treatment. If it is not clear whether you have high blood pressure, you may be asked to return on a different day to have your blood pressure checked again. Or, you may be asked to monitor your blood pressure at home for 1 or more weeks. TREATMENT Treating high blood pressure includes making lifestyle changes and possibly taking medicine. Living a healthy lifestyle can help lower high blood pressure. You may need to change some of your habits. Lifestyle changes may include:  Following the DASH diet. This diet is high in fruits, vegetables, and whole grains. It is low in salt, red meat, and added sugars.  Keep your sodium intake below 2,300 mg per day.  Getting at least 30-45 minutes of aerobic exercise at least 4 times per week.  Losing weight if necessary.  Not smoking.  Limiting alcoholic beverages.  Learning ways to reduce stress. Your health care provider may prescribe medicine if lifestyle changes are not enough to get your blood pressure under control, and if one of the following is  true:  You are 9418-64 years of age and your systolic blood pressure is above 140.  You are 64 years of age or older, and your systolic blood pressure is above 150.  Your diastolic blood pressure is above 90.  You have diabetes, and your systolic blood pressure is over 140 or your diastolic blood pressure is over 90.  You have kidney disease and your blood pressure is above 140/90.  You have heart disease and your blood pressure is above 140/90. Your personal target blood pressure may vary depending on your medical conditions, your age, and other factors. HOME CARE INSTRUCTIONS  Have your blood pressure rechecked as directed by your health care provider.   Take medicines only as directed by your health care provider. Follow the directions carefully. Blood pressure medicines must be taken as prescribed. The medicine does not work as well when you skip doses. Skipping doses also puts you at risk for problems.  Do not smoke.   Monitor your blood pressure at home as directed by your health care provider. SEEK MEDICAL CARE IF:   You think you are having a reaction to medicines taken.  You have recurrent headaches or feel dizzy.  You have swelling in your ankles.  You have trouble with your vision. SEEK IMMEDIATE MEDICAL CARE IF:  You develop a severe headache or confusion.  You have unusual weakness, numbness, or feel faint.  You have severe chest or abdominal pain.  You vomit repeatedly.  You have trouble breathing. MAKE SURE YOU:   Understand these instructions.  Will watch your condition.  Will get help right away if you are not doing well or get worse.   This information is not intended to replace advice given to you by your health care provider. Make sure you discuss any questions you have with your health care provider.   Document Released: 02/01/2005 Document Revised: 06/18/2014 Document Reviewed: 11/24/2012 Elsevier Interactive Patient Education 2016  [employer]  Shortness of Breath Shortness of breath means you have trouble breathing. It could also mean that you have a medical problem. You should get immediate medical care for shortness of breath. CAUSES   Not enough oxygen in the air such as with high altitudes or a smoke-filled room.  Certain lung diseases, infections, or problems.  Heart disease or conditions, such as angina or heart failure.  Low red blood cells (anemia).  Poor physical fitness, which can cause shortness of breath when you exercise.  Chest or back injuries or stiffness.  Being overweight.  Smoking.  Anxiety, which can make you feel like you are not getting enough air. DIAGNOSIS  Serious medical problems can often be found during your physical exam. Tests may also be done to determine why you are having shortness of breath. Tests may include:  Chest X-rays.  Lung function tests.  Blood tests.  An electrocardiogram (ECG).  An ambulatory electrocardiogram. An ambulatory ECG records your heartbeat patterns over a 24-hour period.  Exercise testing.  A transthoracic echocardiogram (TTE). During echocardiography, sound waves are used to evaluate how blood flows through your heart.  A transesophageal echocardiogram (TEE).  Imaging scans. Your health care provider may not be able to find a cause for your shortness of breath after your exam. In this case, it is important to have a follow-up exam with your health care provider as directed.  TREATMENT  Treatment for shortness of breath depends on the cause of your symptoms and can vary greatly. HOME CARE INSTRUCTIONS   Do not smoke. Smoking is a common cause of shortness of breath. If you smoke, ask for help to quit.  Avoid being around chemicals or things that may bother your breathing, such as paint fumes and dust.  Rest as needed. Slowly resume your usual activities.  If medicines were prescribed, take them as directed for the full length of  time directed. This includes oxygen and any inhaled medicines.  Keep all follow-up appointments as directed by your health care provider. SEEK MEDICAL CARE IF:   Your condition does not improve in the time expected.  You have a hard time doing your normal activities even with rest.  You have any new symptoms. SEEK IMMEDIATE MEDICAL CARE IF:   Your shortness of breath gets worse.  You feel light-headed, faint, or develop a cough not controlled with medicines.  You start coughing up blood.  You have pain with breathing.  You have chest pain or pain in your arms, shoulders, or abdomen.  You have a fever.  You are unable to walk up stairs or exercise the way you normally do. MAKE SURE YOU:  Understand these instructions.  Will watch your condition.  Will get help right away if you are not doing well or get worse.   This information is not intended to replace advice given to you by your health care provider. Make sure you discuss any questions you have with your health care provider.   Document Released: 10/27/2000 Document Revised: 02/06/2013 Document Reviewed: 04/19/2011 Elsevier Interactive Patient Education 2016 [employer].   IF you received an x-ray today, you will receive an invoice from [facility]. Please contact [facility] at [phone] with questions or concerns regarding your invoice.   IF you received labwork today, you will receive an invoice from [employer]. Please contact [facility] at [phone] with questions or concerns regarding your invoice.   Our billing staff will not be able to assist you with questions regarding bills from these companies.  You will be contacted with the lab results as soon as they are available. The fastest way to get your results is to activate your My Chart account. Instructions are located on the last page of this paperwork. If you have not heard from [place] regarding the results in 2  weeks, please contact this office.

## 2015-11-26 NOTE — Progress Notes (Signed)
Subjective:  By signing my name below, I, [name] [name], attest that this documentation has been prepared under the direction and in the presence of [name] Staggers, MD. Electronically Signed: Stann [name], Scribe. 11/26/2015 , 12:45 PM .  Patient was seen in Room 10 .   Patient ID: [name] [name], female    DOB: 1950/12/11, 65 y.o.   MRN: [id number] Chief Complaint  Patient presents with  . Bloodwork    "couldn't get it on Friday", pt wants to discuss changing Amlodipine and Valsartan decreasing to 160 mg   HPI [name] [name] is a 65 y.o. female Here for follow up. She was seen by me on Friday last week after evaluation requested from her physical therapist after shortness of breath with activity. She has h/o HTN and had not been since April for HTN. She had also taken herself off medications prior to last visit. Her BP at last visit was 175/77. Recommended D-Dimer to rule out PE and other blood work that day.   There was difficulty obtaining her blood in office, so I recommended her to go to a drawing station, but she declined and planned on coming in today for blood work. With her dyspnea on exertion, recommended cardiology as there was non specific changes on her EKG, and risk factor of cardiac disease with uncontrolled HTN and HLD. We restarted norvasc 5mg , valsartan 320mg  and lipitor 40mg  qd.   Patient states she still has shortness of breath with activity. She was going up about 15 steps 6 evenings ago and became short of breath. After resting for a few minutes, she was able to go down the steps without any problems. She restates that her shortness of breath is not typical and believes it is due to lack of walking for 2.5 years. She mentions feeling a knot in her chest when she laid down supine that night, but this resolved by itself after a few minutes. She denies any chest tightness.   Patient wants to switch back to bystolic 10mg  qd as she's noticed some abdominal discomfort after  taking her lipitor 40mg , valsartan 160mg  and norvasc 5mg  4 and 5 days ago. She denies having an upset stomach while taking bystolic in the past. She's tried taking pepto-bismol for mild relief. She didn't take her medications yesterday or the day prior. She did take her medications this morning.   Patient Active Problem List   Diagnosis Date Noted  . 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 Protein-calorie malnutrition (HCC) 11/15/2014  . Osteomyelitis of right knee region (HCC) 11/08/2014  . Complication of device 11/04/2014  . Septic arthritis (HCC) 11/04/2014  . S/P right knee arthroscopy   . UTI (urinary tract infection) 12/09/2012  . 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 Hyperlipidemia   . Hypertension    Past Medical History:  Diagnosis Date  . Arthritis    "right knee" (07/02/2015)  . Borderline diabetes   . Hyperlipidemia   . Hypertension   . Overactive bladder   . S/P right knee arthroscopy    with septic joint growing out MSSA  . Septic arthritis of knee, right (HCC)   . Wears glasses    Past Surgical History:  Procedure Laterality Date  . COLONOSCOPY    . DIAGNOSTIC LAPAROSCOPY    . JOINT REPLACEMENT    . KNEE ARTHROSCOPY Right 12/09/2012   Procedure: ARTHROSCOPIC IRRIGATION AND DEBRIDEMENT RIGHT KNEE;  Surgeon: Budd Palmer, MD;  Location: [facility] OR;  Service: Orthopedics;  Laterality: Right;  . 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/06/2014   Procedure: PLACEMENT OF CEMENT SPACER;  Surgeon: Loreta Ave, MD;  Location: [facility] OR;  Service: Orthopedics;  Laterality: Right;  . TOTAL KNEE ARTHROPLASTY Bilateral 2011-2012   left-right  . TOTAL KNEE REVISION Right 11/06/2014   Procedure: REMOVAL OF TOTAL COMPONENTS, EXTENSIVE  IRRIGATION AND DEBRIDEMENT  RIGHT KNEE;  Surgeon: Loreta Ave, MD;  Location: [facility] OR;  Service: Orthopedics;  Laterality: Right;  . TOTAL KNEE REVISION Right 07/02/2015  . TOTAL KNEE REVISION Right 07/02/2015   Procedure:  TOTAL KNEE REVISION;  Surgeon: Loreta Ave, MD;  Location: [facility] OR;  Service: Orthopedics;  Laterality: Right;  . TUBAL LIGATION  ~ 1980   No Known Allergies Prior to Admission medications   Medication Sig Start Date End Date Taking? Authorizing Provider  amLODipine (NORVASC) 5 MG tablet Take 1 tablet (5 mg total) by mouth daily. 11/21/15   Shade Flood, MD  atorvastatin (LIPITOR) 40 MG tablet Take 1 tablet (40 mg total) by mouth every morning. 11/21/15   Shade Flood, MD  valsartan (DIOVAN) 320 MG tablet Take 1 tablet (320 mg total) by mouth daily. 11/21/15   Shade Flood, MD   Social History   Social History  . Marital status: Divorced    Spouse name: N/A  . Number of children: N/A  . Years of education: N/A   Occupational History  . Not on file.   Social History Main Topics  . Smoking status: Former Smoker    Packs/day: 0.25    Years: 23.00    Types: Cigarettes    Quit date: 08/01/1992  . Smokeless tobacco: Never Used  . Alcohol use No  . Drug use: No  . Sexual activity: No   Other Topics Concern  . Not on file   Social History Narrative  . No narrative on file   Review of Systems  Constitutional: Negative for fatigue and unexpected weight change.  Respiratory: Positive for shortness of breath (with activity). Negative for chest tightness.   Cardiovascular: Negative for chest pain, palpitations and leg swelling.  Gastrointestinal: Negative for abdominal pain and blood in stool.  Musculoskeletal: Positive for gait problem.  Neurological: Negative for dizziness, syncope, light-headedness and headaches.       Objective:   Physical Exam  Constitutional: She is oriented to person, place, and time. She appears well-developed and well-nourished.  HENT:  Head: Normocephalic and atraumatic.  Eyes: Conjunctivae and EOM are normal. Pupils are equal, round, and reactive to light.  Neck: Carotid bruit is not present.  Cardiovascular: Normal rate, regular rhythm,  normal heart sounds and intact distal pulses.   Pulmonary/Chest: Effort normal and breath sounds normal.  Abdominal: Soft. She exhibits no pulsatile midline mass. There is no tenderness.  Neurological: She is alert and oriented to person, place, and time.  Skin: Skin is warm and dry.  Psychiatric: She has a normal mood and affect. Her behavior is normal.  Vitals reviewed.   Vitals:   11/26/15 1154 11/26/15 1204  BP: (!) 171/81 (!) 169/86  Pulse: (!) 122 (!) 104  Resp: 16   Temp: 98 F (36.7 C)   TempSrc: Oral   SpO2: 97%   Weight: (!) 307 lb 3.2 oz (139.3 kg)   Height: 5\' 4"  (1.626 m)    Dg Chest 2 View  Result Date: 11/26/2015 CLINICAL DATA:  Shortness of breath with exertion.  Hypertension. EXAM: CHEST  2 VIEW COMPARISON:  None. FINDINGS: There is no edema or consolidation. Heart is upper normal in size with pulmonary vascularity within normal limits. No adenopathy. There is atherosclerotic calcification in the aortic arch region. There is degenerative change in the thoracic spine. IMPRESSION: There is no edema or consolidation. There is aortic atherosclerosis. Electronically Signed   By: Bretta Bang III M.D.   On: 11/26/2015 13:03      Assessment & Plan:    Jerzy Crotteau is a 65 y.o. female DOE (dyspnea on exertion) - Plan: DG Chest 2 View, Brain natriuretic peptide  - See last office visit. Differential diagnosis includes likley deconditioning, possible underlying cardiac disease, less likely DVT. Less likely CHF.   -Denies any exertional chest pain, but discussed if any increased dyspnea on exertion, chest tightness or chest pain, to be seen in the emergency room for possible enzymes and further workup for possible anginal equivalent.  - BNP, CBC, d-dimer, electrolytes pending.  -Cardiology referral in place, advised patient to call their office to see if working for cancellation to allow to be seen sooner, 911/ER precautions given  Essential hypertension - Plan:  valsartan (DIOVAN) 160 MG tablet, nebivolol (BYSTOLIC) 10 MG tablet  -Unlikely that amlodipine would cause GI upset, but as she tolerated bysystolic better in the past, will restart Bystolic 10 mg daily.stop amlodipine.  Continue the Diovan at 160 mg daily as that is what she had taken before. Monitor home blood pressure readings, orthostatic/hypotensive precautions were given with the dose of Bystolic  Chest discomfort - Plan: DG Chest 2 View  -Based on description, likely GERD or heartburn. Over-the-counter Zantac discussed, however ER/911 chest pain precautions were reviewed.  Meds ordered this encounter  Medications  . aspirin EC 81 MG tablet    Sig: Take 81 mg by mouth daily.  . valsartan (DIOVAN) 160 MG tablet    Sig: Take 1 tablet (160 mg total) by mouth daily.    Dispense:  30 tablet    Refill:  2  . nebivolol (BYSTOLIC) 10 MG tablet    Sig: Take 1 tablet (10 mg total) by mouth daily.    Dispense:  30 tablet    Refill:  2   Patient Instructions   Okay to stop amlodipine for now, and restart bysystolic. I sent in a prescription for you. I also changed your losartan back to  160 mg as that had worked better for you in the past. Keep an eye on your blood pressures outside of the office, and recheck in 1 week. If pressures remain over 160/100, be seen here or the emergency room. We can discuss any other changes to your regimen next week.  We will check blood work today to look into other causes for your shortness of breath, but follow-up with cardiology as planned. If you have any chest pains, or worsening shortness of breath, call 911 or go to emergency room.   Your chest symptoms with lying down at night may be due to acid reflux or heartburn. You can try Zantac over-the-counter once per day. Follow-up with me to discuss this further next week.  Return to the clinic or go to the nearest emergency room if any of your symptoms worsen or new symptoms  occur.   Hypertension Hypertension, commonly called high blood pressure, is when the force of blood pumping through your arteries is too strong. Your arteries are the blood vessels that carry blood from your heart throughout your body. A blood pressure reading consists of a higher number  over a lower number, such as 110/72. The higher number (systolic) is the pressure inside your arteries when your heart pumps. The lower number (diastolic) is the pressure inside your arteries when your heart relaxes. Ideally you want your blood pressure below 120/80. Hypertension forces your heart to work harder to pump blood. Your arteries may become narrow or stiff. Having untreated or uncontrolled hypertension can cause heart attack, stroke, kidney disease, and other problems. RISK FACTORS Some risk factors for high blood pressure are controllable. Others are not.  Risk factors you cannot control include:   Race. You may be at higher risk if you are African American.  Age. Risk increases with age.  Gender. Men are at higher risk than women before age 64 years. After age 64, women are at higher risk than men. Risk factors you can control include:  Not getting enough exercise or physical activity.  Being overweight.  Getting too much fat, sugar, calories, or salt in your diet.  Drinking too much alcohol. SIGNS AND SYMPTOMS Hypertension does not usually cause signs or symptoms. Extremely high blood pressure (hypertensive crisis) may cause headache, anxiety, shortness of breath, and nosebleed. DIAGNOSIS To check if you have hypertension, your health care provider will measure your blood pressure while you are seated, with your arm held at the level of your heart. It should be measured at least twice using the same arm. Certain conditions can cause a difference in blood pressure between your right and left arms. A blood pressure reading that is higher than normal on one occasion does not mean that you need  treatment. If it is not clear whether you have high blood pressure, you may be asked to return on a different day to have your blood pressure checked again. Or, you may be asked to monitor your blood pressure at home for 1 or more weeks. TREATMENT Treating high blood pressure includes making lifestyle changes and possibly taking medicine. Living a healthy lifestyle can help lower high blood pressure. You may need to change some of your habits. Lifestyle changes may include:  Following the DASH diet. This diet is high in fruits, vegetables, and whole grains. It is low in salt, red meat, and added sugars.  Keep your sodium intake below 2,300 mg per day.  Getting at least 30-45 minutes of aerobic exercise at least 4 times per week.  Losing weight if necessary.  Not smoking.  Limiting alcoholic beverages.  Learning ways to reduce stress. Your health care provider may prescribe medicine if lifestyle changes are not enough to get your blood pressure under control, and if one of the following is true:  You are 2518-64 years of age and your systolic blood pressure is above 140.  You are 64 years of age or older, and your systolic blood pressure is above 150.  Your diastolic blood pressure is above 90.  You have diabetes, and your systolic blood pressure is over 140 or your diastolic blood pressure is over 90.  You have kidney disease and your blood pressure is above 140/90.  You have heart disease and your blood pressure is above 140/90. Your personal target blood pressure may vary depending on your medical conditions, your age, and other factors. HOME CARE INSTRUCTIONS  Have your blood pressure rechecked as directed by your health care provider.   Take medicines only as directed by your health care provider. Follow the directions carefully. Blood pressure medicines must be taken as prescribed. The medicine does not work as well  when you skip doses. Skipping doses also puts you at risk for  problems.  Do not smoke.   Monitor your blood pressure at home as directed by your health care provider. SEEK MEDICAL CARE IF:   You think you are having a reaction to medicines taken.  You have recurrent headaches or feel dizzy.  You have swelling in your ankles.  You have trouble with your vision. SEEK IMMEDIATE MEDICAL CARE IF:  You develop a severe headache or confusion.  You have unusual weakness, numbness, or feel faint.  You have severe chest or abdominal pain.  You vomit repeatedly.  You have trouble breathing. MAKE SURE YOU:   Understand these instructions.  Will watch your condition.  Will get help right away if you are not doing well or get worse.   This information is not intended to replace advice given to you by your health care provider. Make sure you discuss any questions you have with your health care provider.   Document Released: 02/01/2005 Document Revised: 06/18/2014 Document Reviewed: 11/24/2012 Elsevier Interactive Patient Education 2016 Elsevier Inc[employer]  Shortness of Breath Shortness of breath means you have trouble breathing. It could also mean that you have a medical problem. You should get immediate medical care for shortness of breath. CAUSES   Not enough oxygen in the air such as with high altitudes or a smoke-filled room.  Certain lung diseases, infections, or problems.  Heart disease or conditions, such as angina or heart failure.  Low red blood cells (anemia).  Poor physical fitness, which can cause shortness of breath when you exercise.  Chest or back injuries or stiffness.  Being overweight.  Smoking.  Anxiety, which can make you feel like you are not getting enough air. DIAGNOSIS  Serious medical problems can often be found during your physical exam. Tests may also be done to determine why you are having shortness of breath. Tests may include:  Chest X-rays.  Lung function tests.  Blood tests.  An electrocardiogram  (ECG).  An ambulatory electrocardiogram. An ambulatory ECG records your heartbeat patterns over a 24-hour period.  Exercise testing.  A transthoracic echocardiogram (TTE). During echocardiography, sound waves are used to evaluate how blood flows through your heart.  A transesophageal echocardiogram (TEE).  Imaging scans. Your health care provider may not be able to find a cause for your shortness of breath after your exam. In this case, it is important to have a follow-up exam with your health care provider as directed.  TREATMENT  Treatment for shortness of breath depends on the cause of your symptoms and can vary greatly. HOME CARE INSTRUCTIONS   Do not smoke. Smoking is a common cause of shortness of breath. If you smoke, ask for help to quit.  Avoid being around chemicals or things that may bother your breathing, such as paint fumes and dust.  Rest as needed. Slowly resume your usual activities.  If medicines were prescribed, take them as directed for the full length of time directed. This includes oxygen and any inhaled medicines.  Keep all follow-up appointments as directed by your health care provider. SEEK MEDICAL CARE IF:   Your condition does not improve in the time expected.  You have a hard time doing your normal activities even with rest.  You have any new symptoms. SEEK IMMEDIATE MEDICAL CARE IF:   Your shortness of breath gets worse.  You feel light-headed, faint, or develop a cough not controlled with medicines.  You start coughing up blood.  You have pain with breathing.  You have chest pain or pain in your arms, shoulders, or abdomen.  You have a fever.  You are unable to walk up stairs or exercise the way you normally do. MAKE SURE YOU:  Understand these instructions.  Will watch your condition.  Will get help right away if you are not doing well or get worse.   This information is not intended to replace advice given to you by your health  care provider. Make sure you discuss any questions you have with your health care provider.   Document Released: 10/27/2000 Document Revised: 02/06/2013 Document Reviewed: 04/19/2011 Elsevier Interactive Patient Education 2016 [employer].   IF you received an x-ray today, you will receive an invoice from [facility]. Please contact [facility] at [phone] with questions or concerns regarding your invoice.   IF you received labwork today, you will receive an invoice from [employer]. Please contact [facility] at [phone] with questions or concerns regarding your invoice.   Our billing staff will not be able to assist you with questions regarding bills from these companies.  You will be contacted with the lab results as soon as they are available. The fastest way to get your results is to activate your My Chart account. Instructions are located on the last page of this paperwork. If you have not heard from [place] regarding the results in 2 weeks, please contact this office.       I personally performed the services described in this documentation, which was scribed in my presence. The recorded information has been reviewed and considered, and addended by me as needed.   Signed,   [name] Staggers, MD [facility].  11/26/15 6:24 PM

## 2015-11-27 LAB — BRAIN NATRIURETIC PEPTIDE

## 2015-12-02 ENCOUNTER — Ambulatory Visit: Payer: Medicare Other

## 2015-12-05 NOTE — Progress Notes (Signed)
This encounter was created in error - please disregard.

## 2015-12-14 ENCOUNTER — Other Ambulatory Visit: Payer: Self-pay | Admitting: Physician Assistant

## 2015-12-26 DIAGNOSIS — E7800 Pure hypercholesterolemia, unspecified: Secondary | ICD-10-CM | POA: Diagnosis not present

## 2015-12-26 DIAGNOSIS — I10 Essential (primary) hypertension: Secondary | ICD-10-CM | POA: Diagnosis not present

## 2015-12-26 DIAGNOSIS — R7303 Prediabetes: Secondary | ICD-10-CM | POA: Diagnosis not present

## 2015-12-26 DIAGNOSIS — R0602 Shortness of breath: Secondary | ICD-10-CM | POA: Diagnosis not present

## 2015-12-30 ENCOUNTER — Ambulatory Visit: Payer: Medicare Other

## 2015-12-30 DIAGNOSIS — M79644 Pain in right finger(s): Secondary | ICD-10-CM | POA: Diagnosis not present

## 2015-12-30 DIAGNOSIS — T8453XD Infection and inflammatory reaction due to internal right knee prosthesis, subsequent encounter: Secondary | ICD-10-CM | POA: Diagnosis not present

## 2016-01-01 ENCOUNTER — Ambulatory Visit (INDEPENDENT_AMBULATORY_CARE_PROVIDER_SITE_OTHER): Payer: Medicare Other | Admitting: Family Medicine

## 2016-01-01 ENCOUNTER — Encounter: Payer: Self-pay | Admitting: Family Medicine

## 2016-01-01 VITALS — BP 148/62 | HR 78 | Temp 97.5°F | Resp 16 | Ht 66.0 in | Wt 310.8 lb

## 2016-01-01 DIAGNOSIS — H25013 Cortical age-related cataract, bilateral: Secondary | ICD-10-CM | POA: Diagnosis not present

## 2016-01-01 DIAGNOSIS — Z6841 Body Mass Index (BMI) 40.0 and over, adult: Secondary | ICD-10-CM

## 2016-01-01 DIAGNOSIS — E782 Mixed hyperlipidemia: Secondary | ICD-10-CM | POA: Diagnosis not present

## 2016-01-01 DIAGNOSIS — H40053 Ocular hypertension, bilateral: Secondary | ICD-10-CM | POA: Diagnosis not present

## 2016-01-01 DIAGNOSIS — E669 Obesity, unspecified: Secondary | ICD-10-CM | POA: Diagnosis not present

## 2016-01-01 DIAGNOSIS — H40023 Open angle with borderline findings, high risk, bilateral: Secondary | ICD-10-CM | POA: Diagnosis not present

## 2016-01-01 DIAGNOSIS — I10 Essential (primary) hypertension: Secondary | ICD-10-CM

## 2016-01-01 DIAGNOSIS — IMO0001 Reserved for inherently not codable concepts without codable children: Secondary | ICD-10-CM

## 2016-01-01 DIAGNOSIS — I2699 Other pulmonary embolism without acute cor pulmonale: Secondary | ICD-10-CM | POA: Diagnosis not present

## 2016-01-01 DIAGNOSIS — R0609 Other forms of dyspnea: Secondary | ICD-10-CM | POA: Diagnosis not present

## 2016-01-01 DIAGNOSIS — H2513 Age-related nuclear cataract, bilateral: Secondary | ICD-10-CM | POA: Diagnosis not present

## 2016-01-01 DIAGNOSIS — R7989 Other specified abnormal findings of blood chemistry: Secondary | ICD-10-CM | POA: Diagnosis not present

## 2016-01-01 DIAGNOSIS — R000 Tachycardia, unspecified: Secondary | ICD-10-CM | POA: Diagnosis not present

## 2016-01-01 LAB — CBC
HCT: 40.1 % (ref 35.0–45.0)
Hemoglobin: 12.6 g/dL (ref 11.7–15.5)
MCH: 23.7 pg — AB (ref 27.0–33.0)
MCHC: 31.4 g/dL — ABNORMAL LOW (ref 32.0–36.0)
MCV: 75.5 fL — AB (ref 80.0–100.0)
MPV: 9.8 fL (ref 7.5–12.5)
PLATELETS: 298 10*3/uL (ref 140–400)
RBC: 5.31 MIL/uL — ABNORMAL HIGH (ref 3.80–5.10)
RDW: 16.7 % — AB (ref 11.0–15.0)
WBC: 9.8 10*3/uL (ref 3.8–10.8)

## 2016-01-01 LAB — COMPREHENSIVE METABOLIC PANEL
ALBUMIN: 4.2 g/dL (ref 3.6–5.1)
ALT: 11 U/L (ref 6–29)
AST: 14 U/L (ref 10–35)
Alkaline Phosphatase: 112 U/L (ref 33–130)
BUN: 17 mg/dL (ref 7–25)
CHLORIDE: 109 mmol/L (ref 98–110)
CO2: 23 mmol/L (ref 20–31)
CREATININE: 0.71 mg/dL (ref 0.50–0.99)
Calcium: 9.8 mg/dL (ref 8.6–10.4)
Glucose, Bld: 112 mg/dL — ABNORMAL HIGH (ref 65–99)
POTASSIUM: 3.9 mmol/L (ref 3.5–5.3)
SODIUM: 143 mmol/L (ref 135–146)
TOTAL PROTEIN: 7.8 g/dL (ref 6.1–8.1)
Total Bilirubin: 0.3 mg/dL (ref 0.2–1.2)

## 2016-01-01 LAB — LIPID PANEL
CHOL/HDL RATIO: 2.8 ratio (ref <5.0)
CHOLESTEROL: 143 mg/dL (ref <200)
HDL: 51 mg/dL (ref >50)
LDL CALC: 71 mg/dL (ref <100)
TRIGLYCERIDES: 107 mg/dL (ref <150)
VLDL: 21 mg/dL (ref <30)

## 2016-01-01 NOTE — Progress Notes (Signed)
By signing my name below, I, Mesha Guinyard, attest that this documentation has been prepared under the direction and in the presence of Meredith Staggers, MD.  Electronically Signed: Arvilla Market, Medical Scribe. 01/01/16. 3:53 PM.  Subjective:    Patient ID: [name] [name], female    DOB: 07/15/1950, 65 y.o.   MRN: [id number]  HPI Chief Complaint  Patient presents with  . Follow-up    per patient she does not know reason for visit, maybe for bloodwork-last OV 11/26/2015    HPI Comments: [name] [name] is a 65 y.o. female who presents to the [facility] for follow-up. Hx of HTN, HLD, and dyspnea on exertion at last visit 10/11. Initially seen after request from PT due to SOB after activity. Recently seen by cardiology with adjustment of medications for her bp and planned for evaluation for her SOB. She is on nebilvolol, atrovastatin for cholesterol. Last month we requested blood work and recommended lab visit only  Pt has a stress test tomorrow morning, and other visits 01/05/16 and 11/30/1; plans on checking ABI's for possible cardiovascular disease and carotid bruits. Pt suspects the house with the black mold caused her SOB. She now takes nebivolol 20mg  QD, and started on spironolactone QD. Her bp this morning ran higher than nl at 213/109. Reports the past 2 days she has been feeling fine, but mentions she occasionally has a voice change that she can fix by clearing her throat. Denies experiencing any negative side effects while on her medications.  Patient Active Problem List   Diagnosis Date Noted  . 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 Protein-calorie malnutrition (HCC) 11/15/2014  . Osteomyelitis of right knee region (HCC) 11/08/2014  . Complication of device 11/04/2014  . Septic arthritis (HCC) 11/04/2014  . S/P right knee arthroscopy   . UTI (urinary tract infection) 12/09/2012  . 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 Hyperlipidemia     . Hypertension    Past Medical History:  Diagnosis Date  . Arthritis    "right knee" (07/02/2015)  . Borderline diabetes   . Hyperlipidemia   . Hypertension   . Overactive bladder   . S/P right knee arthroscopy    with septic joint growing out MSSA  . Septic arthritis of knee, right (HCC)   . Wears glasses    Past Surgical History:  Procedure Laterality Date  . COLONOSCOPY    . DIAGNOSTIC LAPAROSCOPY    . JOINT REPLACEMENT    . KNEE ARTHROSCOPY Right 12/09/2012   Procedure: ARTHROSCOPIC IRRIGATION AND DEBRIDEMENT RIGHT KNEE;  Surgeon: Budd Palmer, MD;  Location: [facility] OR;  Service: Orthopedics;  Laterality: Right;  . 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/06/2014   Procedure: PLACEMENT OF CEMENT SPACER;  Surgeon: Loreta Ave, MD;  Location: [facility] OR;  Service: Orthopedics;  Laterality: Right;  . TOTAL KNEE ARTHROPLASTY Bilateral 2011-2012   left-right  . TOTAL KNEE REVISION Right 11/06/2014   Procedure: REMOVAL OF TOTAL COMPONENTS, EXTENSIVE  IRRIGATION AND DEBRIDEMENT  RIGHT KNEE;  Surgeon: Loreta Ave, MD;  Location: [facility] OR;  Service: Orthopedics;  Laterality: Right;  . TOTAL KNEE REVISION Right 07/02/2015  . TOTAL KNEE REVISION Right 07/02/2015   Procedure: TOTAL KNEE REVISION;  Surgeon: Loreta Ave, MD;  Location: [facility] OR;  Service: Orthopedics;  Laterality: Right;  . TUBAL LIGATION  ~ 1980   Not on File Prior to Admission medications   Medication Sig Start Date End Date  Taking? Authorizing Provider  aspirin EC 81 MG tablet Take 81 mg by mouth daily.    Historical Provider, MD  atorvastatin (LIPITOR) 40 MG tablet Take 1 tablet (40 mg total) by mouth every morning. 11/21/15   Shade FloodJeffrey R Senan Urey, MD  nebivolol (BYSTOLIC) 10 MG tablet Take 1 tablet (10 mg total) by mouth daily. 11/26/15   Shade FloodJeffrey R Estanislado Surgeon, MD  valsartan (DIOVAN) 160 MG tablet Take 1 tablet (160 mg total) by mouth daily. 11/26/15   Shade FloodJeffrey R Shardea Cwynar, MD   Social History   Social History  . Marital  status: Divorced    Spouse name: N/A  . Number of children: N/A  . Years of education: N/A   Occupational History  . Not on file.   Social History Main Topics  . Smoking status: Former Smoker    Packs/day: 0.25    Years: 23.00    Types: Cigarettes    Quit date: 08/01/1992  . Smokeless tobacco: Never Used  . Alcohol use No  . Drug use: No  . Sexual activity: No   Other Topics Concern  . Not on file   Social History Narrative  . No narrative on file   Review of Systems  HENT: Positive for voice change (improvemnt with clearing her throat).   Respiratory: Negative for shortness of breath.    Objective:  Physical Exam  Constitutional: She appears well-developed and well-nourished. No distress.  Obese with BMI of 50  HENT:  Head: Normocephalic and atraumatic.  Eyes: Conjunctivae are normal.  Neck: Neck supple. Carotid bruit is present (Faint right side).  Cardiovascular: Normal rate, regular rhythm and normal heart sounds.  Exam reveals no gallop and no friction rub.   No murmur heard. Pulmonary/Chest: Effort normal and breath sounds normal. No respiratory distress. She has no wheezes. She has no rales.  Neurological: She is alert.  Skin: Skin is warm and dry.  Psychiatric: She has a normal mood and affect. Her behavior is normal.  Nursing note and vitals reviewed.  BP (!) 148/62 (BP Location: Left Wrist, Patient Position: Sitting, Cuff Size: Normal)   Pulse 78   Temp 97.5 F (36.4 C) (Oral)   Resp 16   Ht 5\' 6"  (1.676 m)   Wt (!) 310 lb 12.8 oz (141 kg)   SpO2 97%   BMI 50.16 kg/m  Assessment & Plan:   [name] [name] is a 65 y.o. female DOE (dyspnea on exertion) - Plan: D-dimer, quantitative (not at [facility]), CBC, CT ANGIO CHEST PE W OR WO CONTRAST  Essential hypertension - Plan: Comprehensive metabolic panel, Lipid panel  Tachycardia - Plan: D-dimer, quantitative (not [facility]), CBC  Mixed hyperlipidemia - Plan: D-dimer, quantitative ([facility])  Class 3  obesity with body mass index (BMI) of 50.0 to 59.9 in adult, unspecified obesity type, unspecified whether serious comorbidity present (HCC)  Positive D dimer - Plan: CT ANGIO CHEST PE W OR WO CONTRAST   Possible multifactorial causes of dyspnea on exertion. Undergoing cardiac workup with BE microvascular. Also improving blood pressure with changes in her regimen by cardiology. D-dimer was obtained as difficulty previously obtaining blood work. It was slightly elevated, so CT chest was ordered. CT chest as below positive for tiny filling defect suspicious for pulmonary embolus. Discuss with pulmonary, did recommend treating for PE with 3 months of Xarelto but will look for DVTs to determine if a longer course needed. If DVTs noted, 6 months likely treatment with Xarelto. Results and plan discussed with  patient on phone on November 20. Follow-up in the next 2 weeks.  Sooner if worse. Risks of blood thinners were discussed, understanding expressed.  No orders of the defined types were placed in this encounter.  Patient Instructions    I will check the blood work today that we had planned on checking last month. No change in your medications for now as your blood pressure appears to be improving on the medications from cardiology.   Continue follow-up as planned with cardiology then once you have had testing and evaluation with cardiology, if they're able to clear you for physical therapy, they can do so, or I can do that if I have necessary information form cardiologist.   Return to the clinic or go to the nearest emergency room if any of your symptoms worsen or new symptoms occur.   IF you received an x-ray today, you will receive an invoice from [facility]. Please contact [facility] at [phone] with questions or concerns regarding your invoice.   IF you received labwork today, you will receive an invoice from [facility]. Please contact [facility]  at [phone] with questions or concerns regarding your invoice.   Our billing staff will not be able to assist you with questions regarding bills from these companies.  You will be contacted with the lab results as soon as they are available. The fastest way to get your results is to activate your My Chart account. Instructions are located on the last page of this paperwork. If you have not heard from us regarding the results in 2 weeks, please contact this office.       I personally performed the services described in this documentation, which was scribed in my presence. The recorded information has been reviewed and considered, and addended by me as needed.   Signed,   Meredith StaggersJeffrey Izamar Linden, MD [facility].  01/05/16 4:49 PM

## 2016-01-01 NOTE — Patient Instructions (Addendum)
  I will check the blood work today that we had planned on checking last month. No change in your medications for now as your blood pressure appears to be improving on the medications from cardiology.   Continue follow-up as planned with cardiology then once you have had testing and evaluation with cardiology, if they're able to clear you for physical therapy, they can do so, or I can do that if I have necessary information form cardiologist.   Return to the clinic or go to the nearest emergency room if any of your symptoms worsen or new symptoms occur.   IF you received an x-ray today, you will receive an invoice from [facility]. Please contact [facility] at [phone] with questions or concerns regarding your invoice.   IF you received labwork today, you will receive an invoice from [facility]. Please contact [facility] at [phone] with questions or concerns regarding your invoice.   Our billing staff will not be able to assist you with questions regarding bills from these companies.  You will be contacted with the lab results as soon as they are available. The fastest way to get your results is to activate your My Chart account. Instructions are located on the last page of this paperwork. If you have not heard from us regarding the results in 2 weeks, please contact this office.

## 2016-01-02 ENCOUNTER — Ambulatory Visit (HOSPITAL_COMMUNITY): Payer: Medicare Other

## 2016-01-02 ENCOUNTER — Telehealth: Payer: Self-pay

## 2016-01-02 ENCOUNTER — Encounter: Payer: Self-pay | Admitting: Family Medicine

## 2016-01-02 DIAGNOSIS — M65311 Trigger thumb, right thumb: Secondary | ICD-10-CM | POA: Diagnosis not present

## 2016-01-02 DIAGNOSIS — I10 Essential (primary) hypertension: Secondary | ICD-10-CM | POA: Diagnosis not present

## 2016-01-02 DIAGNOSIS — R0602 Shortness of breath: Secondary | ICD-10-CM | POA: Diagnosis not present

## 2016-01-02 DIAGNOSIS — M65312 Trigger thumb, left thumb: Secondary | ICD-10-CM | POA: Diagnosis not present

## 2016-01-02 LAB — D-DIMER, QUANTITATIVE (NOT AT ARMC): D DIMER QUANT: 1.04 ug{FEU}/mL — AB (ref <0.50)

## 2016-01-02 NOTE — Telephone Encounter (Signed)
See lab results and ov note Patient set up for chest ct angiogram due to elevated d dimer and [facility] Call results to dr Neva Seatgreene Patient aware.  no pa needed

## 2016-01-02 NOTE — Progress Notes (Signed)
See lab result. Elevated d dimer needs ct of chest due to this and SOB. See lab result and dr Paralee CancelGreene's note. Consulted Sherril CroonMike Clark for correct order  [address], [place], [place] [address]   pt. Advised.  No PA needed per [place].

## 2016-01-05 ENCOUNTER — Ambulatory Visit (HOSPITAL_COMMUNITY)
Admission: RE | Admit: 2016-01-05 | Discharge: 2016-01-05 | Disposition: A | Discharge status: Home / Self Care | Payer: Medicare Other | Source: Ambulatory Visit | Attending: Family Medicine | Admitting: Family Medicine

## 2016-01-05 ENCOUNTER — Telehealth: Payer: Self-pay

## 2016-01-05 ENCOUNTER — Encounter (HOSPITAL_COMMUNITY): Payer: Self-pay

## 2016-01-05 DIAGNOSIS — E042 Nontoxic multinodular goiter: Secondary | ICD-10-CM | POA: Diagnosis not present

## 2016-01-05 DIAGNOSIS — R7989 Other specified abnormal findings of blood chemistry: Secondary | ICD-10-CM | POA: Insufficient documentation

## 2016-01-05 DIAGNOSIS — R938 Abnormal findings on diagnostic imaging of other specified body structures: Secondary | ICD-10-CM | POA: Insufficient documentation

## 2016-01-05 DIAGNOSIS — E7800 Pure hypercholesterolemia, unspecified: Secondary | ICD-10-CM | POA: Diagnosis not present

## 2016-01-05 DIAGNOSIS — R0602 Shortness of breath: Secondary | ICD-10-CM | POA: Diagnosis not present

## 2016-01-05 DIAGNOSIS — I10 Essential (primary) hypertension: Secondary | ICD-10-CM | POA: Diagnosis not present

## 2016-01-05 DIAGNOSIS — R0609 Other forms of dyspnea: Secondary | ICD-10-CM | POA: Insufficient documentation

## 2016-01-05 MED ORDER — IOPAMIDOL (ISOVUE-370) INJECTION 76%
100.0000 mL | Freq: Once | INTRAVENOUS | Status: AC | PRN
  Administered 2016-01-05: 100 mL via INTRAVENOUS

## 2016-01-05 MED ORDER — RIVAROXABAN (XARELTO) VTE STARTER PACK (15 & 20 MG): ORAL_TABLET | ORAL | 0 refills | Status: DC

## 2016-01-05 MED ORDER — SODIUM CHLORIDE 0.9 % IJ SOLN
INTRAMUSCULAR | Status: AC
  Filled 2016-01-05: qty 50

## 2016-01-05 MED ORDER — IOPAMIDOL (ISOVUE-370) INJECTION 76%
INTRAVENOUS | Status: AC
  Filled 2016-01-05: qty 100

## 2016-01-05 NOTE — Telephone Encounter (Signed)
rx faxed to [facility]

## 2016-01-05 NOTE — Telephone Encounter (Signed)
5:05 PM Results discussed with patient. Xarelto prescription printed, please fax to pharmacy of choice. Additionally advise her to stop aspirin while she is taking Xarelto. I also placed order for lower extremity Dopplers. Let me know if there are any questions.

## 2016-01-05 NOTE — Telephone Encounter (Signed)
Left VM on pts home machine stating that Dr Neva SeatGreene is encouraging her to get her CT scan preformed today if at all possible (rather than on the 22nd). I provided her with the scheduling depts number and also stated that if she gives us a call back I would be happy to schedule it for her.

## 2016-01-05 NOTE — Telephone Encounter (Signed)
4:28 PM CT results noted. Tiny defect suspicious for PE noted. Unlikely cause of dyspnea on exertion, but with suspected PE, should treat for minimum 3 months with Xarelto after discussion with pulmonary. Will also schedule an ultrasound of her lower extremities given suspected PE and if DVT's noted, may need to expand to 6 months of treatment.  Attempted call to patient both numbers, no answer, but voicemail left to reach her again or can discuss above plan.   If more information needed, I am back in the office tomorrow, or send me a message. As long as she is okay with this plan, I will send in Xarelto.

## 2016-01-07 ENCOUNTER — Ambulatory Visit (HOSPITAL_COMMUNITY): Payer: Medicare Other

## 2016-01-07 ENCOUNTER — Encounter: Payer: Self-pay | Admitting: Podiatry

## 2016-01-07 ENCOUNTER — Ambulatory Visit (INDEPENDENT_AMBULATORY_CARE_PROVIDER_SITE_OTHER): Payer: Medicare Other | Admitting: Podiatry

## 2016-01-07 VITALS — BP 192/85 | HR 65 | Resp 16

## 2016-01-07 DIAGNOSIS — L84 Corns and callosities: Secondary | ICD-10-CM | POA: Diagnosis not present

## 2016-01-07 DIAGNOSIS — M779 Enthesopathy, unspecified: Secondary | ICD-10-CM | POA: Diagnosis not present

## 2016-01-07 MED ORDER — TRIAMCINOLONE ACETONIDE 10 MG/ML IJ SUSP
10.0000 mg | Freq: Once | INTRAMUSCULAR | Status: AC
  Administered 2016-01-07: 10 mg

## 2016-01-07 NOTE — Progress Notes (Signed)
Subjective:     Patient ID: [name] [name], female   DOB: [birth year], 65 y.o.   MRN: [id number]  HPI patient states she's developed a lot of pain in the outside of the right foot with inflammation fluid and crusted tissue formation   Review of Systems     Objective:   Physical Exam Inflammatory capsulitis right fifth MPJ with pain and keratotic lesion formation    Assessment:     Capsulitis with plantarflexed metatarsal and lesion    Plan:     Injected the fifth MPJ 3 mg [place] some Kenalog and after appropriate numbness debris did lesion fully and advised to be seen back as needed

## 2016-01-12 ENCOUNTER — Telehealth: Payer: Self-pay | Admitting: Emergency Medicine

## 2016-01-12 ENCOUNTER — Telehealth: Payer: Self-pay

## 2016-01-12 NOTE — Telephone Encounter (Signed)
She should be seen here as soon as possible or in ER if more short of breath, chest pain or tightness.

## 2016-01-12 NOTE — Telephone Encounter (Signed)
Left message to return directly to clinic or go to ER if develop increase sob, chest pain or tightness

## 2016-01-12 NOTE — Telephone Encounter (Signed)
Dr. Neva SeatGreene,  Patient reports of light pink blood tinged phlegm with frequent coughing spells since starting Xarelto on Sunday. Informed that is not a common side effect to medication. States cough is worsening especially at night.  No other symptoms reported. Denies cold sx's  Please advise

## 2016-01-12 NOTE — Telephone Encounter (Signed)
PT IS CALLING STATING SHE IS CONSTANTLY COUGHING AND CANT STOP BUT ISNT SICK AND IT JUST STARTED AFTER SHE STARTED TAKING HER RIVAROXABAN THAT THE HOSPITAL PRESCRIBED AND WOULD LIKE Dr. Neva SeatGREENE TO CALL HER   CONTACT [phone]

## 2016-01-16 DIAGNOSIS — N3946 Mixed incontinence: Secondary | ICD-10-CM | POA: Diagnosis not present

## 2016-01-16 DIAGNOSIS — R351 Nocturia: Secondary | ICD-10-CM | POA: Diagnosis not present

## 2016-01-16 DIAGNOSIS — R350 Frequency of micturition: Secondary | ICD-10-CM | POA: Diagnosis not present

## 2016-01-28 ENCOUNTER — Encounter: Payer: Self-pay | Admitting: Family Medicine

## 2016-01-28 DIAGNOSIS — R0989 Other specified symptoms and signs involving the circulatory and respiratory systems: Secondary | ICD-10-CM | POA: Diagnosis not present

## 2016-01-28 DIAGNOSIS — I10 Essential (primary) hypertension: Secondary | ICD-10-CM | POA: Diagnosis not present

## 2016-01-28 DIAGNOSIS — R0602 Shortness of breath: Secondary | ICD-10-CM | POA: Diagnosis not present

## 2016-01-29 DIAGNOSIS — Z136 Encounter for screening for cardiovascular disorders: Secondary | ICD-10-CM | POA: Diagnosis not present

## 2016-02-04 DIAGNOSIS — L680 Hirsutism: Secondary | ICD-10-CM | POA: Diagnosis not present

## 2016-02-06 DIAGNOSIS — R350 Frequency of micturition: Secondary | ICD-10-CM | POA: Diagnosis not present

## 2016-02-06 DIAGNOSIS — N3946 Mixed incontinence: Secondary | ICD-10-CM | POA: Diagnosis not present

## 2016-02-06 DIAGNOSIS — N281 Cyst of kidney, acquired: Secondary | ICD-10-CM | POA: Diagnosis not present

## 2016-02-27 DIAGNOSIS — N289 Disorder of kidney and ureter, unspecified: Secondary | ICD-10-CM | POA: Diagnosis not present

## 2016-02-27 DIAGNOSIS — N281 Cyst of kidney, acquired: Secondary | ICD-10-CM | POA: Diagnosis not present

## 2016-02-27 DIAGNOSIS — N3946 Mixed incontinence: Secondary | ICD-10-CM | POA: Diagnosis not present

## 2016-03-10 ENCOUNTER — Other Ambulatory Visit: Payer: Self-pay | Admitting: Family Medicine

## 2016-03-10 DIAGNOSIS — I10 Essential (primary) hypertension: Secondary | ICD-10-CM

## 2016-03-13 ENCOUNTER — Other Ambulatory Visit: Payer: Self-pay | Admitting: Family Medicine

## 2016-03-13 DIAGNOSIS — I10 Essential (primary) hypertension: Secondary | ICD-10-CM

## 2016-03-13 NOTE — Telephone Encounter (Signed)
12/2015 last ov and labs 

## 2016-03-16 ENCOUNTER — Telehealth: Payer: Self-pay

## 2016-03-16 ENCOUNTER — Other Ambulatory Visit: Payer: Self-pay | Admitting: *Deleted

## 2016-03-16 DIAGNOSIS — I10 Essential (primary) hypertension: Secondary | ICD-10-CM

## 2016-03-16 MED ORDER — NEBIVOLOL HCL 10 MG PO TABS: 10.0000 mg | ORAL_TABLET | Freq: Every day | ORAL | 0 refills | Status: DC

## 2016-03-16 NOTE — Telephone Encounter (Signed)
Pt is needing a refill on her blood pressure medication   Best number is [phone]

## 2016-03-26 DIAGNOSIS — I10 Essential (primary) hypertension: Secondary | ICD-10-CM

## 2016-04-13 DIAGNOSIS — R9439 Abnormal result of other cardiovascular function study: Secondary | ICD-10-CM | POA: Diagnosis not present

## 2016-04-13 DIAGNOSIS — I6521 Occlusion and stenosis of right carotid artery: Secondary | ICD-10-CM | POA: Diagnosis not present

## 2016-04-13 DIAGNOSIS — R0602 Shortness of breath: Secondary | ICD-10-CM | POA: Diagnosis not present

## 2016-04-14 ENCOUNTER — Other Ambulatory Visit: Payer: Self-pay | Admitting: Physician Assistant

## 2016-04-14 DIAGNOSIS — I10 Essential (primary) hypertension: Secondary | ICD-10-CM

## 2016-04-15 NOTE — Telephone Encounter (Signed)
Spoke with patient. Just picked up her prescription sent 04/14/2016. Advised she is due for follow-up. Transferred call to D. Burton to schedule.

## 2016-04-17 ENCOUNTER — Ambulatory Visit (INDEPENDENT_AMBULATORY_CARE_PROVIDER_SITE_OTHER): Payer: Medicare Other

## 2016-04-17 ENCOUNTER — Ambulatory Visit (INDEPENDENT_AMBULATORY_CARE_PROVIDER_SITE_OTHER): Payer: Medicare Other | Admitting: Family Medicine

## 2016-04-17 VITALS — BP 164/80 | HR 86 | Temp 97.8°F | Resp 18 | Ht 65.0 in | Wt 322.2 lb

## 2016-04-17 DIAGNOSIS — I10 Essential (primary) hypertension: Secondary | ICD-10-CM

## 2016-04-17 DIAGNOSIS — M79644 Pain in right finger(s): Secondary | ICD-10-CM

## 2016-04-17 DIAGNOSIS — E785 Hyperlipidemia, unspecified: Secondary | ICD-10-CM | POA: Diagnosis not present

## 2016-04-17 DIAGNOSIS — I2699 Other pulmonary embolism without acute cor pulmonale: Secondary | ICD-10-CM

## 2016-04-17 NOTE — Patient Instructions (Addendum)
  I will try to get the ultrasound of your legs rescheduled to make sure there are no blood clots that can move up to your lungs. I will also repeat the ct scan of your chest as you only took the blood thinner for one month, as that may have not been long enough for that clot to resolve (especially with your shortness of breath).   If cough continues - follow up to discuss other causes, but I will check a cat scan.   If you change your mind about the flu shot, I would recommend that you have that vaccine. You can return here or other facility that gives that vaccine.   I am not changing any of your medications today as those were just changed a few days ago with cardiology. Follow-up as planned with cardiology for repeat electrolytes in 2 weeks, and follow-up office visit with cardiology in one month.  Your thumb pain may be due to arthritis. Try Tylenol over-the-counter, gentle range of motion, and if not improving in the next 1-2 weeks, let me know and I can refer you to a hand specialist.  Return to the clinic or go to the nearest emergency room if any of your symptoms worsen or new symptoms occur.    IF you received an x-ray today, you will receive an invoice from [facility]. Please contact [facility] at [phone] with questions or concerns regarding your invoice.   IF you received labwork today, you will receive an invoice from [facility]. Please contact [facility] at [phone] with questions or concerns regarding your invoice.   Our billing staff will not be able to assist you with questions regarding bills from these companies.  You will be contacted with the lab results as soon as they are available. The fastest way to get your results is to activate your My Chart account. Instructions are located on the last page of this paperwork. If you have not heard from us regarding the results in 2 weeks, please contact this office.

## 2016-04-17 NOTE — Progress Notes (Signed)
By signing my name below I, Shelah Lewandowsky, attest that this documentation has been prepared under the direction and in the presence of Shade Flood, MD. Electonically Signed. Shelah Lewandowsky, Scribe 04/17/2016 at 11:24 AM   Subjective:    Patient ID: [name] [name], female    DOB: December 27, 1950, 66 y.o.   MRN: [id number]  Chief Complaint  Patient presents with  . Follow-up    F/U from specialist visits    HPI [name] [name] is a 66 y.o. female who presents to Primary Care at [facility] for follow up.  Last visit with Dr Neva Seat was 01/01/16. Has history of HTN, HLD, dyspnea on exertion. Seen in follow up with cardiology. BP medications were adjusted at that time.  HTN Lab Results  Component Value Date   CREATININE 0.71 01/01/2016  Pt reports that when she was seen by cardiology 4 days ago on 04/13/16. Pt's BP 217/86 at that time. Pt's medications were changed to 50mg  spironolactone QD, 160mg  Valsartan QD, Norvasc 5mg , and 25mg  chlorthalidone QD. Plan for follow up with cardiology in one month and repeat BNP in 2 weeks. Was told at that time there was no contraindication to return to rehab for her knee. Per cardiology visit there was concern for vascular disease due to ABI and carotid duplex. Had stress test Nov 2017. EF 43%. Overall a low risk study. Had moderate decreased rt ABI of 0.63.     Prediabetes Lab Results  Component Value Date   HGBA1C 6.1 (H) 06/25/2015     HLD Lab Results  Component Value Date   CHOL 143 01/01/2016   HDL 51 01/01/2016   LDLCALC 71 01/01/2016   TRIG 107 01/01/2016   CHOLHDL 2.8 01/01/2016   Lab Results  Component Value Date   ALT 11 01/01/2016   AST 14 01/01/2016   ALKPHOS 112 01/01/2016   BILITOT 0.3 01/01/2016  pt is on 20mg  Lipitor QD.    Carotid bruit Pt had a carotid doppler done on 01/28/16. Rt external carotid stenosis less than 50%. Rt ICA 50-69% stenosis. Left was normal.   Pulmonary Emboli  Pt had a tiny defect Rt lower lobe  suspicious for small PE on 01/05/16 chest CT.  Started on xarelto on 01/05/16. [place] of lower extremity ordered to detect DVT, however [place] has not been performed. Plan for 3 months xarelto and if DVTs present plan to increase to 6 months. Plan to stop aspirin while on xarelto.  Pt today states that she only took her xarelto for 30 days.  Pt has a mild dry cough. Also reports mild SOB while walking. Denies any calf pain or calf swelling.   Pt also c/o pain on her rt 1st MCP joint. Pt has tried tylenol and motrin with no relief.   States she has not had her flu shot.   Patient Active Problem List   Diagnosis Date Noted  . 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 Protein-calorie malnutrition (HCC) 11/15/2014  . Osteomyelitis of right knee region (HCC) 11/08/2014  . Complication of device 11/04/2014  . Septic arthritis (HCC) 11/04/2014  . S/P right knee arthroscopy   . UTI (urinary tract infection) 12/09/2012  . 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 Hyperlipidemia   . Hypertension    Past Medical History:  Diagnosis Date  . Arthritis    "right knee" (07/02/2015)  . Borderline diabetes   . Hyperlipidemia   . Hypertension   . Overactive bladder   . S/P  right knee arthroscopy    with septic joint growing out MSSA  . Septic arthritis of knee, right (HCC)   . Wears glasses    Past Surgical History:  Procedure Laterality Date  . COLONOSCOPY    . DIAGNOSTIC LAPAROSCOPY    . JOINT REPLACEMENT    . KNEE ARTHROSCOPY Right 12/09/2012   Procedure: ARTHROSCOPIC IRRIGATION AND DEBRIDEMENT RIGHT KNEE;  Surgeon: Budd Palmer, MD;  Location: [facility] OR;  Service: Orthopedics;  Laterality: Right;  . 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/06/2014   Procedure: PLACEMENT OF CEMENT SPACER;  Surgeon: Loreta Ave, MD;  Location: [facility] OR;  Service: Orthopedics;  Laterality: Right;  . TOTAL KNEE ARTHROPLASTY Bilateral 2011-2012   left-right  . TOTAL KNEE REVISION Right 11/06/2014     Procedure: REMOVAL OF TOTAL COMPONENTS, EXTENSIVE  IRRIGATION AND DEBRIDEMENT  RIGHT KNEE;  Surgeon: Loreta Ave, MD;  Location[facility] OR;  Service: Orthopedics;  Laterality: Right;  . TOTAL KNEE REVISION Right 07/02/2015  . TOTAL KNEE REVISION Right 07/02/2015   Procedure: TOTAL KNEE REVISION;  Surgeon: Loreta Ave, MD;  Location: [facility] OR;  Service: Orthopedics;  Laterality: Right;  . TUBAL LIGATION  ~ 1980   No Known Allergies Prior to Admission medications   Medication Sig Start Date End Date Taking? Authorizing Provider  aspirin EC 81 MG tablet Take 81 mg by mouth daily.   Yes Historical Provider, MD  atorvastatin (LIPITOR) 40 MG tablet TAKE 1 TABLET(40 MG) BY MOUTH EVERY MORNING 03/11/16  Yes Morrell Riddle, PA-C  BYSTOLIC 20 MG TABS Take 20 mg by mouth daily. 03/22/16  Yes Historical Provider, MD  Rivaroxaban 15 & 20 MG TBPK Take as directed on package: Start with one 15mg  tablet by mouth twice a day with food. On Day 22, switch to one 20mg  tablet once a day with food. 01/05/16  Yes Shade Flood, MD  spironolactone (ALDACTONE) 25 MG tablet Take 25 mg by mouth every morning. 01/25/16  Yes Historical Provider, MD  trimethoprim (TRIMPEX) 100 MG tablet Take 100 mg by mouth daily. 03/15/16  Yes Historical Provider, MD  valsartan (DIOVAN) 160 MG tablet TAKE 1 TABLET(160 MG) BY MOUTH DAILY 03/13/16  Yes Shade Flood, MD  VESICARE 5 MG tablet Take 5 mg by mouth daily. 03/15/16  Yes Historical Provider, MD   Social History   Social History  . Marital status: Divorced    Spouse name: N/A  . Number of children: N/A  . Years of education: N/A   Occupational History  . Not on file.   Social History Main Topics  . Smoking status: Former Smoker    Packs/day: 0.25    Years: 23.00    Types: Cigarettes    Quit date: 08/01/1992  . Smokeless tobacco: Never Used  . Alcohol use No  . Drug use: No  . Sexual activity: No   Other Topics Concern  . Not on file   Social History Narrative   . No narrative on file      Review of Systems  Constitutional: Negative for fatigue and unexpected weight change.  Respiratory: Positive for cough and shortness of breath. Negative for chest tightness.   Cardiovascular: Negative for chest pain, palpitations and leg swelling.  Gastrointestinal: Negative for abdominal pain and blood in stool.  Musculoskeletal: Positive for arthralgias (rt 1st MCP). Negative for myalgias.  Neurological: Negative for dizziness, syncope, light-headedness and headaches.       Objective:   Physical Exam  Constitutional: She is oriented to person, place, and time. She appears well-developed and well-nourished.  HENT:  Head: Normocephalic and atraumatic.  Eyes: Conjunctivae and EOM are normal. Pupils are equal, round, and reactive to light.  Neck: Carotid bruit is not present.  Cardiovascular: Normal rate, regular rhythm, normal heart sounds and intact distal pulses.   Pulmonary/Chest: Effort normal and breath sounds normal.  Abdominal: Soft. She exhibits no pulsatile midline mass. There is no tenderness.  Musculoskeletal:  1st right MCP exam. Pt able to flex and extend against resistance, ROM slightly reduced due to pain. There is no triggerning in the rt 1st MCP. Pt has tenderness to the volar aspect of her rt 1st Volar MCP. Pt has 1+ pedal edema bilat. Calves are non tender Negative homans   Neurological: She is alert and oriented to person, place, and time.  Skin: Skin is warm and dry.  Psychiatric: She has a normal mood and affect. Her behavior is normal.  Vitals reviewed.   Vitals:   04/17/16 1002  BP: (!) 164/80  Pulse: 86  Resp: 18  Temp: 97.8 F (36.6 C)  TempSrc: Oral  SpO2: 96%  Weight: (!) 322 lb 4 oz (146.2 kg)  Height: 5\' 5"  (1.651 m)   Dg Finger Thumb Right  Result Date: 04/17/2016 CLINICAL DATA:  Pain at the base of the right thumb for 2-3 months. EXAM: RIGHT THUMB 2+V COMPARISON:  None. FINDINGS: Negative for fracture or  dislocation. Mild irregularity and degenerative changes at the first carpometacarpal joint. Mild degenerative changes at the first MCP joint. Normal alignment of the right thumb. IMPRESSION: Mild degenerative changes.  No acute bone abnormality. Electronically Signed   By: Richarda OverlieAdam  Henn M.D.   On: 04/17/2016 11:21         Assessment & Plan:   [name] [name] is a 66 y.o. female Other pulmonary embolism without acute cor pulmonale, unspecified chronicity (HCC) - Plan: CT Angio Chest W/Cm &/Or Wo Cm  - Only completed one month out of initial plan for 3 months of Xarelto. Repeat CT chest to determine if PE still present, although small on initial evaluation. Additionally we'll schedule lower stroma Dopplers to look for DVT/risk for future PEs.  Essential hypertension  - Elevated, but improved from cardiology visit, where meds were increased. No further changes today as recent increases from cardiology office visit. Continue follow-up as planned with cardiology with two-week lab follow-up in one month follow up in office  Hyperlipidemia, unspecified hyperlipidemia type  - tolerating lipitor. No changes in medicines today.  Pain of right thumb - Plan: DG Finger Thumb Right  - Degenerative changes noted at affected joint.  No triggering noted.  Trial of Tylenol, gentle range of motion, and if not improving in the next few weeks, consider referral to hand specialist.  Meds ordered this encounter  Medications  . VESICARE 5 MG tablet    Sig: Take 5 mg by mouth daily.    Refill:  11  . DISCONTD: spironolactone (ALDACTONE) 25 MG tablet    Sig: Take 25 mg by mouth every morning.    Refill:  1  . BYSTOLIC 20 MG TABS    Sig: Take 20 mg by mouth daily.    Refill:  1  . trimethoprim (TRIMPEX) 100 MG tablet    Sig: Take 100 mg by mouth daily.    Refill:  11  . amLODipine (NORVASC) 5 MG tablet    Sig: Take 5 mg by mouth daily.  [name] Kitchen. atorvastatin (  LIPITOR) 10 MG tablet    Sig: Take 10 mg by mouth daily.   . chlorthalidone (HYGROTON) 25 MG tablet    Sig: Take 25 mg by mouth daily.  [name] Kitchen spironolactone (ALDACTONE) 50 MG tablet    Sig: Take 50 mg by mouth daily.  . traMADol (ULTRAM) 50 MG tablet    Sig: Take 50 mg by mouth every 6 (six) hours as needed.   Patient Instructions    I will try to get the ultrasound of your legs rescheduled to make sure there are no blood clots that can move up to your lungs. I will also repeat the ct scan of your chest as you only took the blood thinner for one month, as that may have not been long enough for that clot to resolve (especially with your shortness of breath).   If cough continues - follow up to discuss other causes, but I will check a cat scan.   If you change your mind about the flu shot, I would recommend that you have that vaccine. You can return here or other facility that gives that vaccine.   I am not changing any of your medications today as those were just changed a few days ago with cardiology. Follow-up as planned with cardiology for repeat electrolytes in 2 weeks, and follow-up office visit with cardiology in one month.  Your thumb pain may be due to arthritis. Try Tylenol over-the-counter, gentle range of motion, and if not improving in the next 1-2 weeks, let me know and I can refer you to a hand specialist.  Return to the clinic or go to the nearest emergency room if any of your symptoms worsen or new symptoms occur.    IF you received an x-ray today, you will receive an invoice from [facility]. Please contact [facility] at [phone] with questions or concerns regarding your invoice.   IF you received labwork today, you will receive an invoice from [facility]. Please contact [facility] at [phone] with questions or concerns regarding your invoice.   Our billing staff will not be able to assist you with questions regarding bills from these companies.  You will be contacted with the lab results as soon as they are  available. The fastest way to get your results is to activate your My Chart account. Instructions are located on the last page of this paperwork. If you have not heard from [place] regarding the results in 2 weeks, please contact this office.       I personally performed the services described in this documentation, which was scribed in my presence. The recorded information has been reviewed and considered for accuracy and completeness, addended by me as needed, and agree with information above.  Signed,   Meredith Staggers, MD Primary Care at [facility].  04/18/16 9:52 PM

## 2016-04-19 ENCOUNTER — Telehealth: Payer: Self-pay | Admitting: Family Medicine

## 2016-04-19 NOTE — Telephone Encounter (Signed)
Pt is aware of this apt, may need to reschedule depending on sons schd.   She has some questions as to why this imaging is being reordered. I let her know it was likely a follow up to compare the two tests, but she would like a call back when possible. Thanks!   [phone]

## 2016-04-19 NOTE — Telephone Encounter (Signed)
Please see last office visit. She had a small PE previously on CT chest, and only took blood thinner for 1 month as opposed to the planned 3 months or possible 6 months if a DVT was present. Since she did not have an ultrasound of her lower extremity, unknown if DVT was present. That is reason for reorder of ultrasound and CT of chest. This was discussed in office, but please discuss with her again if possible.  If she still needs to talk to me, let me know.

## 2016-04-19 NOTE — Telephone Encounter (Signed)
I explained dr Haze Justingreenes note and verbalized under standing , test is on friday

## 2016-04-19 NOTE — Telephone Encounter (Signed)
Would like to speak to md on why all this is being done again (done last fall)

## 2016-04-19 NOTE — Telephone Encounter (Signed)
Number listed on DPR gives busy signal. Left VM on pt's cell. Please relay apt information if she calls back: She is scheduled for a CT and an ultrasound (ordered by Dr Neva SeatGreene) on 04/22/16 at 2:20 -- [address] Their number is [phone].  She should not eat any solid foods 4 hours prior to this apt, but may have something to drink.

## 2016-04-21 ENCOUNTER — Other Ambulatory Visit: Payer: Self-pay | Admitting: Family Medicine

## 2016-04-21 DIAGNOSIS — I10 Essential (primary) hypertension: Secondary | ICD-10-CM

## 2016-04-22 ENCOUNTER — Other Ambulatory Visit: Payer: Medicare Other

## 2016-04-22 ENCOUNTER — Telehealth: Payer: Self-pay | Admitting: Family Medicine

## 2016-04-22 DIAGNOSIS — M79644 Pain in right finger(s): Secondary | ICD-10-CM

## 2016-04-22 NOTE — Telephone Encounter (Signed)
Have you seen this form in your box?

## 2016-04-22 NOTE — Telephone Encounter (Signed)
[phone] incontinence supplies waiting

## 2016-04-22 NOTE — Telephone Encounter (Signed)
Pt states that her hands are still bothering her and she would like a referral to a hand specialist. States that this is something she and Dr Neva SeatGreene have talked about in the past. Thank you!

## 2016-04-22 NOTE — Telephone Encounter (Signed)
Referral placed. Please advise patient, it may take a few weeks to get in to see hand specialist, so if worsening prior to that visit, can follow-up to recheck.

## 2016-04-23 ENCOUNTER — Ambulatory Visit
Admission: RE | Admit: 2016-04-23 | Discharge: 2016-04-23 | Disposition: A | Discharge status: Home / Self Care | Payer: Medicare Other | Source: Ambulatory Visit | Attending: Family Medicine | Admitting: Family Medicine

## 2016-04-23 DIAGNOSIS — I2699 Other pulmonary embolism without acute cor pulmonale: Secondary | ICD-10-CM

## 2016-04-23 MED ORDER — IOPAMIDOL (ISOVUE-370) INJECTION 76%
100.0000 mL | Freq: Once | INTRAVENOUS | Status: AC | PRN
  Administered 2016-04-23: 100 mL via INTRAVENOUS

## 2016-04-23 NOTE — Telephone Encounter (Signed)
Although I'm listed as her primary care provider, we have not specifically discussed her incontinence in detail (as primarily discussing pulmonary embolus, and dyspnea on exertion/hypertension).    In review of the paperwork, it does ask about medical and functional status. Attempted phone call on 04/23/16 to discuss questions on form and to discuss status of incontinence. If the symptoms are stable, can discuss at next visit. If any worsening, should follow-up sooner. I have completed her form and will fax rx.

## 2016-04-25 ENCOUNTER — Other Ambulatory Visit: Payer: Self-pay | Admitting: Family Medicine

## 2016-04-25 DIAGNOSIS — E041 Nontoxic single thyroid nodule: Secondary | ICD-10-CM

## 2016-04-27 ENCOUNTER — Ambulatory Visit (INDEPENDENT_AMBULATORY_CARE_PROVIDER_SITE_OTHER): Payer: Medicare Other | Admitting: Orthopedic Surgery

## 2016-05-03 ENCOUNTER — Encounter (INDEPENDENT_AMBULATORY_CARE_PROVIDER_SITE_OTHER): Payer: Self-pay | Admitting: Orthopedic Surgery

## 2016-05-03 ENCOUNTER — Ambulatory Visit (INDEPENDENT_AMBULATORY_CARE_PROVIDER_SITE_OTHER): Payer: Medicare Other | Admitting: Orthopedic Surgery

## 2016-05-03 VITALS — Ht 65.0 in | Wt 322.0 lb

## 2016-05-03 DIAGNOSIS — M65311 Trigger thumb, right thumb: Secondary | ICD-10-CM

## 2016-05-03 NOTE — Progress Notes (Signed)
Office Visit Note   Patient: [name] [name]           Date of Birth: September 18, 1950           MRN: [id number] Visit Date: 05/03/2016              Requested by: Shade Flood, MD [address] [place], [place] [address] PCP: Shade Flood, MD  Chief Complaint  Patient presents with  . Left Hand - Pain  . Right Hand - Pain    HPI: Bilateral hand thumb and ring finger triggering. Patient states that she must manually release. The patient states that she has decreased strength and grip. Left hand dominant.     Patient complains of triggering right long finger and right thumb as well as left ring finger. Assessment & Plan: Visit Diagnoses:  1. Trigger thumb, right thumb     Plan:  Patient has failed conservative therapy and she would like to proceed with surgical intervention. We will plan for outpatient surgery at the orthopedic surgical Center for release of the A1 pulley right long finger and right thumb to be followed with surgery on the left hand for the ring finger once her right hand is stabilized. Follow-Up Instructions: Return in about 2 weeks (around 05/17/2016).   Ortho Exam On examination patient is alert oriented no adenopathy well-dressed normal left normal respiratory effort she has a normal gait. Examination she has good pulses in both upper extremities or hands are neurovascularly intact FDS and FDP are intact. Examination of the right thumb she has a nodule at the A1 pulley and has palpable triggering at the A1 pulley of the right thumb she also has a nodule at the base of the right long finger with palpable triggering at the A1 pulley right long finger. She has less nodule changes of the left ring finger but does have palpable triggering at the left ring finger. ROS: Complete review of systems negative except as mentioned in history of present illness. Imaging: No results found.  Labs: Lab Results  Component Value Date   HGBA1C 6.1 (H) 06/25/2015   HGBA1C 6.2  (H) 11/04/2014   HGBA1C 6.6 (H) 12/09/2012   ESRSEDRATE 45 (H) 11/04/2014   ESRSEDRATE 120 (H) 09/09/2013   ESRSEDRATE 12 06/19/2013   CRP 0.9 11/04/2014   CRP 11.8 (H) 09/09/2013   CRP <0.5 06/19/2013   REPTSTATUS 11/08/2014 FINAL 11/05/2014   GRAMSTAIN  11/04/2014    ABUNDANT WBC PRESENT,BOTH PMN AND MONONUCLEAR FEW GRAM POSITIVE COCCI IN PAIRS    GRAMSTAIN  11/04/2014    ABUNDANT WBC PRESENT,BOTH PMN AND MONONUCLEAR FEW GRAM POSITIVE COCCI IN PAIRS    CULT  11/05/2014    20,000 COLONIES/mL PROTEUS MIRABILIS 20,000 COLONIES/mL ENTEROCOCCUS SPECIES    LABORGA PROTEUS MIRABILIS 11/05/2014   LABORGA ENTEROCOCCUS SPECIES 11/05/2014    Orders:  No orders of the defined types were placed in this encounter.  No orders of the defined types were placed in this encounter.    Procedures: No procedures performed  Clinical Data: No additional findings.  Subjective: Review of Systems  Objective: Vital Signs: Ht 5\' 5"  (1.651 m)   Wt (!) 322 lb (146.1 kg)   BMI 53.58 kg/m   Specialty Comments:  No specialty comments available.  PMFS History: Patient Active Problem List   Diagnosis Date Noted  . Trigger thumb, right thumb 05/03/2016  . 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 Protein-calorie malnutrition (HCC) 11/15/2014  . Osteomyelitis of right knee  region [facility](HCC) 11/08/2014  . Complication of device 11/04/2014  . Septic arthritis (HCC) 11/04/2014  . S/P right knee arthroscopy   . UTI (urinary tract infection) 12/09/2012  . 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 Hyperlipidemia   . Hypertension    Past Medical History:  Diagnosis Date  . Arthritis    "right knee" (07/02/2015)  . Borderline diabetes   . Hyperlipidemia   . Hypertension   . Overactive bladder   . S/P right knee arthroscopy    with septic joint growing out MSSA  . Septic arthritis of knee, right (HCC)   . Wears glasses     Family History  Problem Relation Age of Onset  .  Cancer - Other Mother   . Hypertension Other     Past Surgical History:  Procedure Laterality Date  . COLONOSCOPY    . DIAGNOSTIC LAPAROSCOPY    . JOINT REPLACEMENT    . KNEE ARTHROSCOPY Right 12/09/2012   Procedure: ARTHROSCOPIC IRRIGATION AND DEBRIDEMENT RIGHT KNEE;  Surgeon: Budd PalmerMichael H Handy, MD;  Location: [facility] OR;  Service: Orthopedics;  Laterality: Right;  . 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/06/2014   Procedure: PLACEMENT OF CEMENT SPACER;  Surgeon: Loreta Aveaniel F Murphy, MD;  [facility];  Service: Orthopedics;  Laterality: Right;  . TOTAL KNEE ARTHROPLASTY Bilateral 2011-2012   left-right  . TOTAL KNEE REVISION Right 11/06/2014   Procedure: REMOVAL OF TOTAL COMPONENTS, EXTENSIVE  IRRIGATION AND DEBRIDEMENT  RIGHT KNEE;  Surgeon: Loreta Aveaniel F Murphy, MD;  [facility];  Service: Orthopedics;  Laterality: Right;  . TOTAL KNEE REVISION Right 07/02/2015  . TOTAL KNEE REVISION Right 07/02/2015   Procedure: TOTAL KNEE REVISION;  Surgeon: Loreta Aveaniel F Murphy, MD;  [facility];  Service: Orthopedics;  Laterality: Right;  . TUBAL LIGATION  ~ 1980   Social History   Occupational History  . Not on file.   Social History Main Topics  . Smoking status: Former Smoker    Packs/day: 0.25    Years: 23.00    Types: Cigarettes    Quit date: 08/01/1992  . Smokeless tobacco: Never Used  . Alcohol use No  . Drug use: No  . Sexual activity: No

## 2016-05-07 ENCOUNTER — Other Ambulatory Visit: Payer: Self-pay | Admitting: Physician Assistant

## 2016-05-07 ENCOUNTER — Other Ambulatory Visit: Payer: Self-pay | Admitting: Cardiology

## 2016-05-07 DIAGNOSIS — R0602 Shortness of breath: Secondary | ICD-10-CM | POA: Diagnosis not present

## 2016-05-07 DIAGNOSIS — R7303 Prediabetes: Secondary | ICD-10-CM | POA: Diagnosis not present

## 2016-05-07 DIAGNOSIS — I10 Essential (primary) hypertension: Secondary | ICD-10-CM

## 2016-05-07 DIAGNOSIS — R9439 Abnormal result of other cardiovascular function study: Secondary | ICD-10-CM | POA: Diagnosis not present

## 2016-05-11 ENCOUNTER — Ambulatory Visit
Admission: RE | Admit: 2016-05-11 | Discharge: 2016-05-11 | Disposition: A | Discharge status: Home / Self Care | Payer: Medicare Other | Source: Ambulatory Visit | Attending: Family Medicine | Admitting: Family Medicine

## 2016-05-11 DIAGNOSIS — E041 Nontoxic single thyroid nodule: Secondary | ICD-10-CM

## 2016-05-12 ENCOUNTER — Telehealth: Payer: Self-pay | Admitting: Family Medicine

## 2016-05-12 NOTE — Telephone Encounter (Signed)
PATIENT WOULD LIKE DR. GREENE TO KNOW THAT SHE NEEDS A REFILL ON HER VALSARTAN 160 MG FOR HER BLOOD PRESSURE. SHE ONLY HAS ONE PILL LEFT. (I EXPLAINED OUR REFILL POLICY) SHE WOULD LIKE DR. GREENE TO GIVE HER A 90 DAY SUPPLY INSTEAD OF A 30 DAY SUPPLY. BEST PHONE [phone] (HOME) PHARMACY CHOICE IS [facility] ON [address] MBC

## 2016-05-12 NOTE — Telephone Encounter (Signed)
[facility]07[facility]16 [facility]

## 2016-05-18 ENCOUNTER — Ambulatory Visit
Admission: RE | Admit: 2016-05-18 | Discharge: 2016-05-18 | Disposition: A | Discharge status: Home / Self Care | Payer: Medicare Other | Source: Ambulatory Visit | Attending: Cardiology | Admitting: Cardiology

## 2016-05-18 DIAGNOSIS — I10 Essential (primary) hypertension: Secondary | ICD-10-CM

## 2016-05-21 DIAGNOSIS — I10 Essential (primary) hypertension: Secondary | ICD-10-CM | POA: Diagnosis not present

## 2016-05-25 DIAGNOSIS — M65331 Trigger finger, right middle finger: Secondary | ICD-10-CM | POA: Diagnosis not present

## 2016-05-25 DIAGNOSIS — M65311 Trigger thumb, right thumb: Secondary | ICD-10-CM | POA: Diagnosis not present

## 2016-06-04 ENCOUNTER — Ambulatory Visit (INDEPENDENT_AMBULATORY_CARE_PROVIDER_SITE_OTHER): Payer: Medicare Other | Admitting: Orthopedic Surgery

## 2016-06-18 ENCOUNTER — Telehealth (INDEPENDENT_AMBULATORY_CARE_PROVIDER_SITE_OTHER): Payer: Self-pay | Admitting: Orthopedic Surgery

## 2016-06-18 NOTE — Telephone Encounter (Signed)
PT CALLED AND STATED HER HAND IS DOING GREAT AND THAT SHE IS READY TO LEFT HAND DONE AS WELL, PLEASE ADVISE.  [phone]

## 2016-06-18 NOTE — Telephone Encounter (Signed)
Pt had A1 pulley release on the right side and is ready for the left side. Would like for you to ket her know if this can be set up.

## 2016-06-21 NOTE — Telephone Encounter (Signed)
Blue sheet completed.

## 2016-06-24 DIAGNOSIS — I10 Essential (primary) hypertension: Secondary | ICD-10-CM | POA: Diagnosis not present

## 2016-06-24 DIAGNOSIS — R7303 Prediabetes: Secondary | ICD-10-CM | POA: Diagnosis not present

## 2016-06-24 DIAGNOSIS — Z6841 Body Mass Index (BMI) 40.0 and over, adult: Secondary | ICD-10-CM | POA: Diagnosis not present

## 2016-07-17 ENCOUNTER — Telehealth: Payer: Self-pay | Admitting: Family Medicine

## 2016-07-17 NOTE — Telephone Encounter (Signed)
If symptoms persist, would recommend evaluation through our office or other medical provider for urinalysis as could still have infection.

## 2016-07-17 NOTE — Telephone Encounter (Signed)
Pt stated she is having pressure on her bladder like its built up gas.[name] Kitchen. She wants to know what she can do to get rid of that.  Please advise: [phone]

## 2016-07-17 NOTE — Telephone Encounter (Signed)
Pressure since this am in bladder even after voiding.  "feels like a cramp"  Denies fever or blood.  Has a f/u with urology  Last BM was Friday and normal  On a daily antibiotic and bladder med per pt.  I told her fluids are good, ibuprofen or tyl. If needed (and is able to take)  Seek help if fever or increased pain.  Any advice? Concerns?

## 2016-07-19 NOTE — Telephone Encounter (Signed)
Pt states she is much better. "i think it was just gas, I am releasing a lot of gas and feel better"  I advised if things change, like new fever, increased pain come see us. F/u with urology as planned

## 2016-07-23 DIAGNOSIS — M25512 Pain in left shoulder: Secondary | ICD-10-CM | POA: Diagnosis not present

## 2016-07-23 DIAGNOSIS — M542 Cervicalgia: Secondary | ICD-10-CM | POA: Diagnosis not present

## 2016-07-23 DIAGNOSIS — M25511 Pain in right shoulder: Secondary | ICD-10-CM | POA: Diagnosis not present

## 2016-07-23 DIAGNOSIS — M25561 Pain in right knee: Secondary | ICD-10-CM | POA: Diagnosis not present

## 2016-07-30 ENCOUNTER — Other Ambulatory Visit: Payer: Self-pay | Admitting: Cardiology

## 2016-07-30 DIAGNOSIS — R079 Chest pain, unspecified: Secondary | ICD-10-CM

## 2016-07-30 DIAGNOSIS — I10 Essential (primary) hypertension: Secondary | ICD-10-CM | POA: Diagnosis not present

## 2016-07-30 DIAGNOSIS — E785 Hyperlipidemia, unspecified: Secondary | ICD-10-CM | POA: Diagnosis not present

## 2016-07-30 DIAGNOSIS — G894 Chronic pain syndrome: Secondary | ICD-10-CM | POA: Diagnosis not present

## 2016-08-03 ENCOUNTER — Encounter: Payer: Self-pay | Admitting: Orthopedic Surgery

## 2016-08-03 DIAGNOSIS — M65342 Trigger finger, left ring finger: Secondary | ICD-10-CM | POA: Diagnosis not present

## 2016-08-13 ENCOUNTER — Ambulatory Visit (INDEPENDENT_AMBULATORY_CARE_PROVIDER_SITE_OTHER): Payer: Medicare Other | Admitting: Orthopedic Surgery

## 2016-08-13 DIAGNOSIS — M65342 Trigger finger, left ring finger: Secondary | ICD-10-CM

## 2016-08-13 DIAGNOSIS — M25561 Pain in right knee: Secondary | ICD-10-CM | POA: Diagnosis not present

## 2016-08-13 NOTE — Progress Notes (Signed)
Office Visit Note   Patient: [name] [name]           Date of Birth: Jul 08, 1950           MRN: [id number] Visit Date: 08/13/2016              Requested by: Shade FloodGreene, Jeffrey R, MD [address] [place], [place] [id number] PCP: Shade FloodGreene, Jeffrey R, MD  No chief complaint on file.     HPI: Patient is 10 days status post trigger finger release left ring finger. She has no pain no symptoms  Assessment & Plan: Visit Diagnoses:  1. Trigger finger, left ring finger     Plan: Harvest the sutures scar massage follow-up as needed  Follow-Up Instructions: Return if symptoms worsen or fail to improve.   Ortho Exam  Patient is alert, oriented, no adenopathy, well-dressed, normal affect, normal respiratory effort. Examination patient has full active range of motion her hand is neurovascularly intact no triggering the incision is well-healed there is no cellulitis  Imaging: No results found.  Labs: Lab Results  Component Value Date   HGBA1C 6.1 (H) 06/25/2015   HGBA1C 6.2 (H) 11/04/2014   HGBA1C 6.6 (H) 12/09/2012   ESRSEDRATE 45 (H) 11/04/2014   ESRSEDRATE 120 (H) 09/09/2013   ESRSEDRATE 12 06/19/2013   CRP 0.9 11/04/2014   CRP 11.8 (H) 09/09/2013   CRP <0.5 06/19/2013   REPTSTATUS 11/08/2014 FINAL 11/05/2014   GRAMSTAIN  11/04/2014    ABUNDANT WBC PRESENT,BOTH PMN AND MONONUCLEAR FEW GRAM POSITIVE COCCI IN PAIRS    GRAMSTAIN  11/04/2014    ABUNDANT WBC PRESENT,BOTH PMN AND MONONUCLEAR FEW GRAM POSITIVE COCCI IN PAIRS    CULT  11/05/2014    20,000 COLONIES/mL PROTEUS MIRABILIS 20,000 COLONIES/mL ENTEROCOCCUS SPECIES    LABORGA PROTEUS MIRABILIS 11/05/2014   LABORGA ENTEROCOCCUS SPECIES 11/05/2014    Orders:  No orders of the defined types were placed in this encounter.  No orders of the defined types were placed in this encounter.    Procedures: No procedures performed  Clinical Data: No additional findings.  ROS:  All other systems negative, except as  noted in the HPI. Review of Systems  Objective: Vital Signs: There were no vitals taken for this visit.  Specialty Comments:  No specialty comments available.  PMFS History: Patient Active Problem List   Diagnosis Date Noted  . Trigger thumb, right thumb 05/03/2016  . 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 Protein-calorie malnutrition (HCC) 11/15/2014  . Osteomyelitis of right knee region (HCC) 11/08/2014  . Complication of device 11/04/2014  . Septic arthritis (HCC) 11/04/2014  . S/P right knee arthroscopy   . UTI (urinary tract infection) 12/09/2012  . 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 Hyperlipidemia   . Hypertension    Past Medical History:  Diagnosis Date  . Arthritis    "right knee" (07/02/2015)  . Borderline diabetes   . Hyperlipidemia   . Hypertension   . Overactive bladder   . S/P right knee arthroscopy    with septic joint growing out MSSA  . Septic arthritis of knee, right (HCC)   . Wears glasses     Family History  Problem Relation Age of Onset  . Cancer - Other Mother   . Hypertension Other     Past Surgical History:  Procedure Laterality Date  . COLONOSCOPY    . DIAGNOSTIC LAPAROSCOPY    . JOINT REPLACEMENT    . KNEE ARTHROSCOPY Right 12/09/2012  Procedure: ARTHROSCOPIC IRRIGATION AND DEBRIDEMENT RIGHT KNEE;  Surgeon: Budd Palmer, MD;  Location: [facility] OR;  Service: Orthopedics;  Laterality: Right;  . 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/06/2014   Procedure: PLACEMENT OF CEMENT SPACER;  Surgeon: Loreta Ave, MD;  Location: [facility] OR;  Service: Orthopedics;  Laterality: Right;  . TOTAL KNEE ARTHROPLASTY Bilateral 2011-2012   left-right  . TOTAL KNEE REVISION Right 11/06/2014   Procedure: REMOVAL OF TOTAL COMPONENTS, EXTENSIVE  IRRIGATION AND DEBRIDEMENT  RIGHT KNEE;  Surgeon: Loreta Ave, MD;  Location: [facility] OR;  Service: Orthopedics;  Laterality: Right;  . TOTAL KNEE REVISION Right 07/02/2015  . TOTAL KNEE REVISION  Right 07/02/2015   Procedure: TOTAL KNEE REVISION;  Surgeon: Loreta Ave, MD;  Location: [facility] OR;  Service: Orthopedics;  Laterality: Right;  . TUBAL LIGATION  ~ 1980   Social History   Occupational History  . Not on file.   Social History Main Topics  . Smoking status: Former Smoker    Packs/day: 0.25    Years: 23.00    Types: Cigarettes    Quit date: 08/01/1992  . Smokeless tobacco: Never Used  . Alcohol use No  . Drug use: No  . Sexual activity: No

## 2016-08-16 ENCOUNTER — Telehealth: Payer: Self-pay | Admitting: Family Medicine

## 2016-08-16 NOTE — Telephone Encounter (Signed)
Pt calling to see if Dr Jacinto HalimGanji as sent over pt labs to Dr Neva SeatGreene

## 2016-08-17 ENCOUNTER — Other Ambulatory Visit: Payer: Self-pay | Admitting: Physician Assistant

## 2016-08-17 ENCOUNTER — Telehealth: Payer: Self-pay | Admitting: Family Medicine

## 2016-08-17 DIAGNOSIS — I10 Essential (primary) hypertension: Secondary | ICD-10-CM

## 2016-08-17 NOTE — Telephone Encounter (Signed)
PATIENT STATES DR. Neva SeatGREENE ONLY GAVE HER A 30 DAY SUPPLY OF HER BYSTOLIC 20 MG. SHE USUALLY GETS A 90 DAY SUPPLY. SHE ALSO NEEDS A REFILL ON HER VALSARTAN 160 MG. SHE GETS A 90 DAY SUPPLY OF THIS AS WELL. BEST PHONE [phone] (HOME) PHARMACY CHOICE IS [facility] ON [address]. MBC

## 2016-08-18 MED ORDER — VALSARTAN 160 MG PO TABS
160.0000 mg | ORAL_TABLET | Freq: Every day | ORAL | 0 refills | Status: DC
Start: 2016-08-18 — End: 2017-10-10

## 2016-08-18 MED ORDER — BYSTOLIC 20 MG PO TABS: 20.0000 mg | ORAL_TABLET | Freq: Every day | ORAL | 0 refills | Status: DC

## 2016-08-18 MED ORDER — ATORVASTATIN CALCIUM 40 MG PO TABS: 40.0000 mg | ORAL_TABLET | Freq: Every day | ORAL | 0 refills | Status: DC

## 2016-08-18 NOTE — Telephone Encounter (Signed)
I sent in 90 day supply of Bystolic, valsartan and lipitor, but these meds have been adjusted by cardiology in past and I do not see recent labs from cardiology. See last telephone message, and verify that these are the correct doses from her last visit and cardiology plans (Dr. Jacinto HalimGanji).  Additionally she is due for OV with me as last visit was in November 2017 and had discussed 2 week follow up at that time.

## 2016-08-19 ENCOUNTER — Telehealth: Payer: Self-pay | Admitting: Family Medicine

## 2016-08-19 ENCOUNTER — Other Ambulatory Visit: Payer: Self-pay | Admitting: Emergency Medicine

## 2016-08-19 MED ORDER — BYSTOLIC 20 MG PO TABS: 20.0000 mg | ORAL_TABLET | Freq: Every day | ORAL | 0 refills | Status: DC

## 2016-08-19 NOTE — Telephone Encounter (Signed)
Left message to return message for clarification of Bystolic supply. Medication was written for 90 day and sent to pharmacy.  Advised to return message if script was sent to incorrect pharmacy

## 2016-08-19 NOTE — Telephone Encounter (Signed)
Medication [facility]

## 2016-08-19 NOTE — Telephone Encounter (Signed)
Pt states that the pharmacy did not get her bystolic medication for 90 days  Best number [phone]

## 2016-08-19 NOTE — Telephone Encounter (Signed)
Pt did return Jill's call and yes the pharmacy needed [facility] not [facility] please resend the bystolic

## 2016-08-19 NOTE — Telephone Encounter (Signed)
Pt confirmed dosage on Valsartan, Lipitor and Bystolic. States she is taking 10 mg tab Bystolic and not 20mg .  Her last office visit was back in March. She will return for f/u in 2 months

## 2016-08-19 NOTE — Telephone Encounter (Signed)
Please advise 

## 2016-08-20 ENCOUNTER — Telehealth: Payer: Self-pay

## 2016-08-20 MED ORDER — NEBIVOLOL HCL 10 MG PO TABS: 10.0000 mg | ORAL_TABLET | Freq: Every day | ORAL | 0 refills | Status: DC

## 2016-08-20 NOTE — Telephone Encounter (Signed)
Thanks, not sure why I did not see my visit with her in March. I may not have filled her antihypertensives as thought cardiology was managing these due to recent changes.  I changed Bystolic to 10mg  - please make sure pharmacy fills correct dose. Appreciate the help.

## 2016-08-20 NOTE — Telephone Encounter (Signed)
I am out of town - will check when I return.

## 2016-08-20 NOTE — Telephone Encounter (Signed)
Received call from pt stating that Valsartan was incorrectly called in to her pharmacy. Per pt 320mg  tablets were ordered. Per order by Dr. Neva SeatGreene order is for 160mg  PO Daily. Called in new Rx.

## 2016-08-23 ENCOUNTER — Encounter (HOSPITAL_COMMUNITY)
Admission: RE | Admit: 2016-08-23 | Discharge: 2016-08-23 | Disposition: A | Discharge status: Home / Self Care | Payer: Medicare Other | Source: Ambulatory Visit | Attending: Cardiology | Admitting: Cardiology

## 2016-08-23 DIAGNOSIS — R079 Chest pain, unspecified: Secondary | ICD-10-CM | POA: Insufficient documentation

## 2016-08-23 DIAGNOSIS — I10 Essential (primary) hypertension: Secondary | ICD-10-CM | POA: Diagnosis not present

## 2016-08-23 DIAGNOSIS — G894 Chronic pain syndrome: Secondary | ICD-10-CM | POA: Diagnosis not present

## 2016-08-23 DIAGNOSIS — E785 Hyperlipidemia, unspecified: Secondary | ICD-10-CM | POA: Diagnosis not present

## 2016-08-23 MED ORDER — REGADENOSON 0.4 MG/5ML IV SOLN
0.4000 mg | Freq: Once | INTRAVENOUS | Status: AC
  Administered 2016-08-23: 0.4 mg via INTRAVENOUS

## 2016-08-23 MED ORDER — REGADENOSON 0.4 MG/5ML IV SOLN
INTRAVENOUS | Status: AC
  Filled 2016-08-23: qty 5

## 2016-08-24 ENCOUNTER — Encounter (HOSPITAL_COMMUNITY)
Admission: RE | Admit: 2016-08-24 | Discharge: 2016-08-24 | Disposition: A | Discharge status: Home / Self Care | Payer: Medicare Other | Source: Ambulatory Visit | Attending: Cardiology | Admitting: Cardiology

## 2016-08-24 DIAGNOSIS — R079 Chest pain, unspecified: Secondary | ICD-10-CM | POA: Diagnosis not present

## 2016-08-24 MED ORDER — TECHNETIUM TC 99M TETROFOSMIN IV KIT
30.0000 | PACK | Freq: Once | INTRAVENOUS | Status: AC | PRN
  Administered 2016-08-23: 30 via INTRAVENOUS

## 2016-08-24 MED ORDER — TECHNETIUM TC 99M TETROFOSMIN IV KIT
30.0000 | PACK | Freq: Once | INTRAVENOUS | Status: AC | PRN
  Administered 2016-08-24: 30 via INTRAVENOUS

## 2016-08-27 DIAGNOSIS — E785 Hyperlipidemia, unspecified: Secondary | ICD-10-CM | POA: Diagnosis not present

## 2016-08-27 DIAGNOSIS — I10 Essential (primary) hypertension: Secondary | ICD-10-CM | POA: Diagnosis not present

## 2016-08-27 DIAGNOSIS — G894 Chronic pain syndrome: Secondary | ICD-10-CM | POA: Diagnosis not present

## 2016-08-27 DIAGNOSIS — I209 Angina pectoris, unspecified: Secondary | ICD-10-CM | POA: Diagnosis not present

## 2016-08-27 DIAGNOSIS — R9439 Abnormal result of other cardiovascular function study: Secondary | ICD-10-CM | POA: Diagnosis not present

## 2016-08-31 DIAGNOSIS — I10 Essential (primary) hypertension: Secondary | ICD-10-CM | POA: Diagnosis not present

## 2016-08-31 DIAGNOSIS — E785 Hyperlipidemia, unspecified: Secondary | ICD-10-CM | POA: Diagnosis not present

## 2016-08-31 DIAGNOSIS — Z01810 Encounter for preprocedural cardiovascular examination: Secondary | ICD-10-CM | POA: Diagnosis not present

## 2016-09-02 ENCOUNTER — Encounter (HOSPITAL_COMMUNITY)
Admission: RE | Disposition: A | Discharge status: Home / Self Care | Payer: Self-pay | Source: Ambulatory Visit | Attending: Cardiology

## 2016-09-02 ENCOUNTER — Encounter (HOSPITAL_COMMUNITY): Payer: Self-pay | Admitting: Cardiology

## 2016-09-02 ENCOUNTER — Ambulatory Visit (HOSPITAL_COMMUNITY)
Admission: RE | Admit: 2016-09-02 | Discharge: 2016-09-02 | Disposition: A | Discharge status: Home / Self Care | Payer: Medicare Other | Source: Ambulatory Visit | Attending: Cardiology | Admitting: Cardiology

## 2016-09-02 DIAGNOSIS — E785 Hyperlipidemia, unspecified: Secondary | ICD-10-CM | POA: Insufficient documentation

## 2016-09-02 DIAGNOSIS — I10 Essential (primary) hypertension: Secondary | ICD-10-CM | POA: Insufficient documentation

## 2016-09-02 DIAGNOSIS — Z7982 Long term (current) use of aspirin: Secondary | ICD-10-CM | POA: Insufficient documentation

## 2016-09-02 DIAGNOSIS — Z6841 Body Mass Index (BMI) 40.0 and over, adult: Secondary | ICD-10-CM | POA: Insufficient documentation

## 2016-09-02 DIAGNOSIS — I25119 Atherosclerotic heart disease of native coronary artery with unspecified angina pectoris: Secondary | ICD-10-CM | POA: Insufficient documentation

## 2016-09-02 DIAGNOSIS — R9439 Abnormal result of other cardiovascular function study: Secondary | ICD-10-CM | POA: Diagnosis not present

## 2016-09-02 DIAGNOSIS — E669 Obesity, unspecified: Secondary | ICD-10-CM | POA: Diagnosis not present

## 2016-09-02 DIAGNOSIS — G894 Chronic pain syndrome: Secondary | ICD-10-CM | POA: Diagnosis not present

## 2016-09-02 HISTORY — PX: LEFT HEART CATH AND CORONARY ANGIOGRAPHY: CATH118249

## 2016-09-02 LAB — GLUCOSE, CAPILLARY: GLUCOSE-CAPILLARY: 114 mg/dL — AB (ref 65–99)

## 2016-09-02 SURGERY — LEFT HEART CATH AND CORONARY ANGIOGRAPHY
Anesthesia: LOCAL

## 2016-09-02 MED ORDER — SODIUM CHLORIDE 0.9 % WEIGHT BASED INFUSION: 1.0000 mL/kg/h | INTRAVENOUS | Status: DC

## 2016-09-02 MED ORDER — ASPIRIN 81 MG PO CHEW
CHEWABLE_TABLET | ORAL | Status: AC
  Filled 2016-09-02: qty 1

## 2016-09-02 MED ORDER — SODIUM CHLORIDE 0.9 % WEIGHT BASED INFUSION
3.0000 mL/kg/h | INTRAVENOUS | Status: DC
  Administered 2016-09-02: 3 mL/kg/h via INTRAVENOUS

## 2016-09-02 MED ORDER — FENTANYL CITRATE (PF) 100 MCG/2ML IJ SOLN
INTRAMUSCULAR | Status: AC
  Filled 2016-09-02: qty 2

## 2016-09-02 MED ORDER — ONDANSETRON HCL 4 MG/2ML IJ SOLN: 4.0000 mg | Freq: Four times a day (QID) | INTRAMUSCULAR | Status: DC | PRN

## 2016-09-02 MED ORDER — LABETALOL HCL 5 MG/ML IV SOLN
INTRAVENOUS | Status: AC
  Filled 2016-09-02: qty 4

## 2016-09-02 MED ORDER — LIDOCAINE HCL (PF) 1 % IJ SOLN
INTRAMUSCULAR | Status: AC
  Filled 2016-09-02: qty 30

## 2016-09-02 MED ORDER — SODIUM CHLORIDE 0.9% FLUSH: 3.0000 mL | INTRAVENOUS | Status: DC | PRN

## 2016-09-02 MED ORDER — SODIUM CHLORIDE 0.9 % IV SOLN: 250.0000 mL | INTRAVENOUS | Status: DC | PRN

## 2016-09-02 MED ORDER — FENTANYL CITRATE (PF) 100 MCG/2ML IJ SOLN
INTRAMUSCULAR | Status: DC | PRN
  Administered 2016-09-02 (×2): 25 ug via INTRAVENOUS

## 2016-09-02 MED ORDER — LABETALOL HCL 5 MG/ML IV SOLN
INTRAVENOUS | Status: DC | PRN
  Administered 2016-09-02: 10 mg via INTRAVENOUS

## 2016-09-02 MED ORDER — IOPAMIDOL (ISOVUE-370) INJECTION 76%
INTRAVENOUS | Status: DC | PRN
  Administered 2016-09-02: 90 mL via INTRA_ARTERIAL

## 2016-09-02 MED ORDER — HEPARIN (PORCINE) IN NACL 2-0.9 UNIT/ML-% IJ SOLN
INTRAMUSCULAR | Status: AC
  Filled 2016-09-02: qty 1000

## 2016-09-02 MED ORDER — IOPAMIDOL (ISOVUE-370) INJECTION 76%
INTRAVENOUS | Status: AC
Start: 2016-09-02 — End: 2016-09-02
  Filled 2016-09-02: qty 100

## 2016-09-02 MED ORDER — MIDAZOLAM HCL 2 MG/2ML IJ SOLN
INTRAMUSCULAR | Status: DC | PRN
  Administered 2016-09-02 (×2): 1 mg via INTRAVENOUS

## 2016-09-02 MED ORDER — SODIUM CHLORIDE 0.9% FLUSH: 3.0000 mL | Freq: Two times a day (BID) | INTRAVENOUS | Status: DC

## 2016-09-02 MED ORDER — OXYCODONE-ACETAMINOPHEN 5-325 MG PO TABS: 1.0000 | ORAL_TABLET | ORAL | Status: DC | PRN

## 2016-09-02 MED ORDER — ASPIRIN 81 MG PO CHEW
81.0000 mg | CHEWABLE_TABLET | ORAL | Status: AC
  Administered 2016-09-02: 81 mg via ORAL

## 2016-09-02 MED ORDER — LIDOCAINE HCL (PF) 1 % IJ SOLN
INTRAMUSCULAR | Status: DC | PRN
  Administered 2016-09-02: 22 mL

## 2016-09-02 MED ORDER — ACETAMINOPHEN 325 MG PO TABS: 650.0000 mg | ORAL_TABLET | ORAL | Status: DC | PRN

## 2016-09-02 MED ORDER — MIDAZOLAM HCL 2 MG/2ML IJ SOLN
INTRAMUSCULAR | Status: AC
  Filled 2016-09-02: qty 2

## 2016-09-02 MED ORDER — HEPARIN (PORCINE) IN NACL 2-0.9 UNIT/ML-% IJ SOLN
INTRAMUSCULAR | Status: AC | PRN
  Administered 2016-09-02: 1000 mL

## 2016-09-02 SURGICAL SUPPLY — 8 items
CATH INFINITI 5FR MULTPACK ANG (CATHETERS) ×2 IMPLANT
KIT HEART LEFT (KITS) ×2 IMPLANT
NEEDLE SMART 18GX3.5CM LONG (NEEDLE) ×2 IMPLANT
PACK CARDIAC CATHETERIZATION (CUSTOM PROCEDURE TRAY) ×2 IMPLANT
SHEATH PINNACLE 5F 10CM (SHEATH) ×2 IMPLANT
SYR MEDRAD MARK V 150ML (SYRINGE) ×2 IMPLANT
TRANSDUCER W/STOPCOCK (MISCELLANEOUS) ×2 IMPLANT
WIRE EMERALD 3MM-J .035X150CM (WIRE) ×2 IMPLANT

## 2016-09-02 NOTE — Interval H&P Note (Signed)
Cath Lab Visit (complete for each Cath Lab visit)  Clinical Evaluation Leading to the Procedure:   ACS: No.  Non-ACS:    Anginal Classification: CCS III  Anti-ischemic medical therapy: Maximal Therapy (2 or more classes of medications)  Non-Invasive Test Results: Intermediate-risk stress test findings: cardiac mortality 1-3%/year  Prior CABG: No previous CABG      History and Physical Interval Note:  09/02/2016 7:30 AM  [name] [name]  has presented today for surgery, with the diagnosis of abnormal stress test  The various methods of treatment have been discussed with the patient and family. After consideration of risks, benefits and other options for treatment, the patient has consented to  Procedure(s): Left Heart Cath and Coronary Angiography (N/A) as a surgical intervention .  The patient's history has been reviewed, patient examined, no change in status, stable for surgery.  I have reviewed the patient's chart and labs.  Questions were answered to the patient's satisfaction.     Rinaldo CloudHarwani, [place] Dubray

## 2016-09-02 NOTE — Progress Notes (Addendum)
Site area: RFA Site Prior to Removal:  Level 0 Pressure Applied For:26 min Manual: yes   Patient Status During Pull: stable  Post Pull Site:  Level 0 Post Pull Instructions Given:  yes Post Pull Pulses Present: palpable Dressing Applied:  tegaderm Bedrest begins @ 0915 till 1315 Comments:

## 2016-09-02 NOTE — H&P (Signed)
The printed H&P in the chart needs to be scanned 

## 2016-09-02 NOTE — Discharge Instructions (Signed)
Angiogram, Care After °This sheet gives you information about how to care for yourself after your procedure. Your health care provider may also give you more specific instructions. If you have problems or questions, contact your health care provider. °What can I expect after the procedure? °After the procedure, it is common to have bruising and tenderness at the catheter insertion area. °Follow these instructions at home: °Insertion site care  °· Follow instructions from your health care provider about how to take care of your insertion site. Make sure you: °¨ Wash your hands with soap and water before you change your bandage (dressing). If soap and water are not available, use hand sanitizer. °¨ Change your dressing as told by your health care provider. °¨ Leave stitches (sutures), skin glue, or adhesive strips in place. These skin closures may need to stay in place for 2 weeks or longer. If adhesive strip edges start to loosen and curl up, you may trim the loose edges. Do not remove adhesive strips completely unless your health care provider tells you to do that. °· Do not take baths, swim, or use a hot tub until your health care provider approves. °· You may shower 24-48 hours after the procedure or as told by your health care provider. °¨ Gently wash the site with plain soap and water. °¨ Pat the area dry with a clean towel. °¨ Do not rub the site. This may cause bleeding. °· Do not apply powder or lotion to the site. Keep the site clean and dry. °· Check your insertion site every day for signs of infection. Check for: °¨ Redness, swelling, or pain. °¨ Fluid or blood. °¨ Warmth. °¨ Pus or a bad smell. °Activity  °· Rest as told by your health care provider, usually for 1-2 days. °· Do not lift anything that is heavier than 10 lbs. (4.5 kg) or as told by your health care provider. °· Do not drive for 24 hours if you were given a medicine to help you relax (sedative). °· Do not drive or use heavy machinery while  taking prescription pain medicine. °General instructions  °· Return to your normal activities as told by your health care provider, usually in about a week. Ask your health care provider what activities are safe for you. °· If the catheter site starts bleeding, lie flat and put pressure on the site. If the bleeding does not stop, get help right away. This is a medical emergency. °· Drink enough fluid to keep your urine clear or pale yellow. This helps flush the contrast dye from your body. °· Take over-the-counter and prescription medicines only as told by your health care provider. °· Keep all follow-up visits as told by your health care provider. This is important. °Contact a health care provider if: °· You have a fever or chills. °· You have redness, swelling, or pain around your insertion site. °· You have fluid or blood coming from your insertion site. °· The insertion site feels warm to the touch. °· You have pus or a bad smell coming from your insertion site. °· You have bruising around the insertion site. °· You notice blood collecting in the tissue around the catheter site (hematoma). The hematoma may be painful to the touch. °Get help right away if: °· You have severe pain at the catheter insertion area. °· The catheter insertion area swells very fast. °· The catheter insertion area is bleeding, and the bleeding does not stop when you hold steady pressure on   the area. °· The area near or just beyond the catheter insertion site becomes pale, cool, tingly, or numb. °These symptoms may represent a serious problem that is an emergency. Do not wait to see if the symptoms will go away. Get medical help right away. Call your local emergency services (911 in the [place] Do not drive yourself to the hospital. °Summary °· After the procedure, it is common to have bruising and tenderness at the catheter insertion area. °· After the procedure, it is important to rest and drink plenty of fluids. °· Do not take baths,  swim, or use a hot tub until your health care provider says it is okay to do so. You may shower 24-48 hours after the procedure or as told by your health care provider. °· If the catheter site starts bleeding, lie flat and put pressure on the site. If the bleeding does not stop, get help right away. This is a medical emergency. °This information is not intended to replace advice given to you by your health care provider. Make sure you discuss any questions you have with your health care provider. °Document Released: 08/20/2004 Document Revised: 01/07/2016 Document Reviewed: 01/07/2016 °Elsevier Interactive Patient Education © 2017 [employer] Inc[employer] ° °

## 2016-09-14 DIAGNOSIS — I2510 Atherosclerotic heart disease of native coronary artery without angina pectoris: Secondary | ICD-10-CM | POA: Diagnosis not present

## 2016-09-14 DIAGNOSIS — E785 Hyperlipidemia, unspecified: Secondary | ICD-10-CM | POA: Diagnosis not present

## 2016-09-14 DIAGNOSIS — R0789 Other chest pain: Secondary | ICD-10-CM | POA: Diagnosis not present

## 2016-09-14 DIAGNOSIS — Z9861 Coronary angioplasty status: Secondary | ICD-10-CM | POA: Diagnosis not present

## 2016-09-14 DIAGNOSIS — I10 Essential (primary) hypertension: Secondary | ICD-10-CM | POA: Diagnosis not present

## 2016-09-17 DIAGNOSIS — H40023 Open angle with borderline findings, high risk, bilateral: Secondary | ICD-10-CM | POA: Diagnosis not present

## 2016-09-17 DIAGNOSIS — N3281 Overactive bladder: Secondary | ICD-10-CM | POA: Diagnosis not present

## 2016-09-27 DIAGNOSIS — Z124 Encounter for screening for malignant neoplasm of cervix: Secondary | ICD-10-CM | POA: Diagnosis not present

## 2016-09-27 DIAGNOSIS — N950 Postmenopausal bleeding: Secondary | ICD-10-CM | POA: Diagnosis not present

## 2016-10-07 DIAGNOSIS — R938 Abnormal findings on diagnostic imaging of other specified body structures: Secondary | ICD-10-CM | POA: Diagnosis not present

## 2016-10-07 DIAGNOSIS — N950 Postmenopausal bleeding: Secondary | ICD-10-CM | POA: Diagnosis not present

## 2016-10-07 DIAGNOSIS — Z1231 Encounter for screening mammogram for malignant neoplasm of breast: Secondary | ICD-10-CM | POA: Diagnosis not present

## 2016-10-12 ENCOUNTER — Other Ambulatory Visit: Payer: Self-pay | Admitting: Obstetrics and Gynecology

## 2016-10-12 DIAGNOSIS — E2839 Other primary ovarian failure: Secondary | ICD-10-CM

## 2016-10-12 DIAGNOSIS — R5381 Other malaise: Secondary | ICD-10-CM

## 2016-10-19 ENCOUNTER — Other Ambulatory Visit: Payer: Medicare Other

## 2016-10-20 DIAGNOSIS — N950 Postmenopausal bleeding: Secondary | ICD-10-CM | POA: Diagnosis not present

## 2016-10-20 DIAGNOSIS — R938 Abnormal findings on diagnostic imaging of other specified body structures: Secondary | ICD-10-CM | POA: Diagnosis not present

## 2016-10-26 ENCOUNTER — Ambulatory Visit
Admission: RE | Admit: 2016-10-26 | Discharge: 2016-10-26 | Disposition: A | Discharge status: Home / Self Care | Payer: Medicare Other | Source: Ambulatory Visit | Attending: Obstetrics and Gynecology | Admitting: Obstetrics and Gynecology

## 2016-10-26 ENCOUNTER — Other Ambulatory Visit: Payer: Medicare Other

## 2016-10-26 DIAGNOSIS — Z780 Asymptomatic menopausal state: Secondary | ICD-10-CM | POA: Diagnosis not present

## 2016-10-26 DIAGNOSIS — Z13820 Encounter for screening for osteoporosis: Secondary | ICD-10-CM | POA: Diagnosis not present

## 2016-10-26 DIAGNOSIS — E2839 Other primary ovarian failure: Secondary | ICD-10-CM

## 2016-11-03 ENCOUNTER — Ambulatory Visit (INDEPENDENT_AMBULATORY_CARE_PROVIDER_SITE_OTHER): Payer: Self-pay

## 2016-11-03 ENCOUNTER — Ambulatory Visit (INDEPENDENT_AMBULATORY_CARE_PROVIDER_SITE_OTHER): Payer: Medicare Other

## 2016-11-03 ENCOUNTER — Ambulatory Visit (INDEPENDENT_AMBULATORY_CARE_PROVIDER_SITE_OTHER): Payer: Medicare Other | Admitting: Orthopedic Surgery

## 2016-11-03 DIAGNOSIS — M25511 Pain in right shoulder: Secondary | ICD-10-CM | POA: Diagnosis not present

## 2016-11-03 DIAGNOSIS — M25512 Pain in left shoulder: Secondary | ICD-10-CM | POA: Diagnosis not present

## 2016-11-03 DIAGNOSIS — G8929 Other chronic pain: Secondary | ICD-10-CM

## 2016-11-03 MED ORDER — LIDOCAINE HCL 1 % IJ SOLN
5.0000 mL | INTRAMUSCULAR | Status: AC | PRN
  Administered 2016-11-03: 5 mL

## 2016-11-03 MED ORDER — METHYLPREDNISOLONE ACETATE 40 MG/ML IJ SUSP
40.0000 mg | INTRAMUSCULAR | Status: AC | PRN
  Administered 2016-11-03: 40 mg via INTRA_ARTICULAR

## 2016-11-03 NOTE — Progress Notes (Addendum)
Office Visit Note   Patient: [name] [name]           Date of Birth: 11-25-1950           MRN: [id number] Visit Date: 11/03/2016              Requested by: Shade Flood, MD [address] [place], [place] [address] PCP: Shade Flood, MD  Chief Complaint  Patient presents with  . Left Shoulder - Pain  . Right Shoulder - Pain      HPI: 9The patient is a 66 year old woman who presents today complaining bilateral shoulder pain. This is been ongoing for about a year now complains of constant pain worse with lifting above head reaching as well as reaching behind her back. She's tried topical creams as well as Tylenol and ibuprofen with minimal relief. States "I'm worried i have arthritis in my shoulders." Has difficulty cooking and lifting pots.  Assessment & Plan: Visit Diagnoses:  1. Chronic pain of both shoulders     Plan: Bilateral depomedrol injections to shoulders. Follow up in 4 weeks if no relief.   Follow-Up Instructions: Return in about 4 weeks (around 12/01/2016), or if symptoms worsen or fail to improve.   Right Shoulder Exam   Tenderness  The patient is experiencing tenderness in the biceps tendon and acromioclavicular joint.  Range of Motion  The patient has normal right shoulder ROM.  Muscle Strength  The patient has normal right shoulder strength.  Tests  Drop Arm: negative Impingement: negative  Other  Pulse: present   Left Shoulder Exam   Tenderness  Left shoulder tenderness location: poorly localized anterior pain.  Muscle Strength  The patient has normal left shoulder strength.  Tests  Drop Arm: negative Impingement: negative      Patient is alert, oriented, no adenopathy, well-dressed, normal affect, normal respiratory effort.   Imaging: No results found. No images are attached to the encounter.  Labs: Lab Results  Component Value Date   HGBA1C 6.1 (H) 06/25/2015   HGBA1C 6.2 (H) 11/04/2014   HGBA1C 6.6 (H)  12/09/2012   ESRSEDRATE 45 (H) 11/04/2014   ESRSEDRATE 120 (H) 09/09/2013   ESRSEDRATE 12 06/19/2013   CRP 0.9 11/04/2014   CRP 11.8 (H) 09/09/2013   CRP <0.5 06/19/2013   REPTSTATUS 11/08/2014 FINAL 11/05/2014   GRAMSTAIN  11/04/2014    ABUNDANT WBC PRESENT,BOTH PMN AND MONONUCLEAR FEW GRAM POSITIVE COCCI IN PAIRS    GRAMSTAIN  11/04/2014    ABUNDANT WBC PRESENT,BOTH PMN AND MONONUCLEAR FEW GRAM POSITIVE COCCI IN PAIRS    CULT  11/05/2014    20,000 COLONIES/mL PROTEUS MIRABILIS 20,000 COLONIES/mL ENTEROCOCCUS SPECIES    LABORGA PROTEUS MIRABILIS 11/05/2014   LABORGA ENTEROCOCCUS SPECIES 11/05/2014    Orders:  Orders Placed This Encounter  Procedures  . XR Shoulder Left  . XR Shoulder Right   No orders of the defined types were placed in this encounter.    Procedures: Large Joint Inj Date/Time: 11/03/2016 4:48 PM Performed by: Barnie Del R Authorized by: Barnie Del R   Consent Given by:  Patient Site marked: the procedure site was marked   Timeout: prior to procedure the correct patient, procedure, and site was verified   Indications:  Pain and diagnostic evaluation Location:  Shoulder Site:  R subacromial bursa Prep: patient was prepped and draped in usual sterile fashion   Needle Size:  22 G Needle Length:  1.5 inches Ultrasound Guidance: No   Fluoroscopic Guidance: No  Arthrogram: No   Medications:  5 mL lidocaine 1 %; 40 mg methylPREDNISolone acetate 40 MG/ML Aspiration Attempted: No   Patient tolerance:  Patient tolerated the procedure well with no immediate complications Large Joint Inj Date/Time: 11/03/2016 4:48 PM Performed by: Adonis Huguenin Authorized by: Barnie Del R   Consent Given by:  Patient Site marked: the procedure site was marked   Timeout: prior to procedure the correct patient, procedure, and site was verified   Indications:  Pain and diagnostic evaluation Location:  Shoulder Site:  L subacromial bursa Prep: patient was  prepped and draped in usual sterile fashion   Needle Size:  22 G Needle Length:  1.5 inches Ultrasound Guidance: No   Fluoroscopic Guidance: No   Arthrogram: No   Medications:  5 mL lidocaine 1 %; 40 mg methylPREDNISolone acetate 40 MG/ML Aspiration Attempted: No   Patient tolerance:  Patient tolerated the procedure well with no immediate complications    Clinical Data: No additional findings.  ROS:  All other systems negative, except as noted in the HPI. Review of Systems  Constitutional: Negative for chills and fever.  Musculoskeletal: Positive for arthralgias and myalgias. Negative for neck pain.  Neurological: Negative for weakness.    Objective: Vital Signs: There were no vitals taken for this visit.  Specialty Comments:  No specialty comments available.  PMFS History: Patient Active Problem List   Diagnosis Date Noted  . Trigger thumb, right thumb 05/03/2016  . 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 Protein-calorie malnutrition (HCC) 11/15/2014  . Osteomyelitis of right knee region (HCC) 11/08/2014  . Complication of device 11/04/2014  . Septic arthritis (HCC) 11/04/2014  . S/P right knee arthroscopy   . UTI (urinary tract infection) 12/09/2012  . 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 Hyperlipidemia   . Hypertension    Past Medical History:  Diagnosis Date  . Arthritis    "right knee" (07/02/2015)  . Borderline diabetes   . Hyperlipidemia   . Hypertension   . Overactive bladder   . S/P right knee arthroscopy    with septic joint growing out MSSA  . Septic arthritis of knee, right (HCC)   . Wears glasses     Family History  Problem Relation Age of Onset  . Cancer - Other Mother   . Hypertension Other     Past Surgical History:  Procedure Laterality Date  . COLONOSCOPY    . DIAGNOSTIC LAPAROSCOPY    . JOINT REPLACEMENT    . KNEE ARTHROSCOPY Right 12/09/2012   Procedure: ARTHROSCOPIC IRRIGATION AND DEBRIDEMENT RIGHT KNEE;   Surgeon: Budd Palmer, MD;  Location: [facility] OR;  Service: Orthopedics;  Laterality: Right;  . LEFT HEART CATH AND CORONARY ANGIOGRAPHY N/A 09/02/2016   Procedure: Left Heart Cath and Coronary Angiography;  Surgeon: Rinaldo Cloud, MD;  Location: [facility] INVASIVE CV LAB;  Service: Cardiovascular;  Laterality: N/A;  . 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/06/2014   Procedure: PLACEMENT OF CEMENT SPACER;  Surgeon: Loreta Ave, MD;  Location: [facility] OR;  Service: Orthopedics;  Laterality: Right;  . TOTAL KNEE ARTHROPLASTY Bilateral 2011-2012   left-right  . TOTAL KNEE REVISION Right 11/06/2014   Procedure: REMOVAL OF TOTAL COMPONENTS, EXTENSIVE  IRRIGATION AND DEBRIDEMENT  RIGHT KNEE;  Surgeon: Loreta Ave, MD;  Location: [facility] OR;  Service: Orthopedics;  Laterality: Right;  . TOTAL KNEE REVISION Right 07/02/2015  . TOTAL KNEE REVISION Right 07/02/2015   Procedure: TOTAL KNEE REVISION;  Surgeon:  Loreta Ave, MD;  Location: [facility] OR;  Service: Orthopedics;  Laterality: Right;  . TUBAL LIGATION  ~ 1980   Social History   Occupational History  . Not on file.   Social History Main Topics  . Smoking status: Former Smoker    Packs/day: 0.25    Years: 23.00    Types: Cigarettes    Quit date: 08/01/1992  . Smokeless tobacco: Never Used  . Alcohol use No  . Drug use: No  . Sexual activity: No

## 2016-12-15 DIAGNOSIS — R0789 Other chest pain: Secondary | ICD-10-CM | POA: Diagnosis not present

## 2016-12-15 DIAGNOSIS — G894 Chronic pain syndrome: Secondary | ICD-10-CM | POA: Diagnosis not present

## 2016-12-15 DIAGNOSIS — E785 Hyperlipidemia, unspecified: Secondary | ICD-10-CM | POA: Diagnosis not present

## 2016-12-15 DIAGNOSIS — I10 Essential (primary) hypertension: Secondary | ICD-10-CM | POA: Diagnosis not present

## 2016-12-15 DIAGNOSIS — I2510 Atherosclerotic heart disease of native coronary artery without angina pectoris: Secondary | ICD-10-CM | POA: Diagnosis not present

## 2016-12-15 DIAGNOSIS — H40023 Open angle with borderline findings, high risk, bilateral: Secondary | ICD-10-CM | POA: Diagnosis not present

## 2016-12-29 ENCOUNTER — Encounter: Payer: Self-pay | Admitting: Podiatry

## 2016-12-29 ENCOUNTER — Ambulatory Visit (INDEPENDENT_AMBULATORY_CARE_PROVIDER_SITE_OTHER): Payer: Medicare Other | Admitting: Podiatry

## 2016-12-29 DIAGNOSIS — L84 Corns and callosities: Secondary | ICD-10-CM

## 2016-12-29 DIAGNOSIS — M779 Enthesopathy, unspecified: Secondary | ICD-10-CM | POA: Diagnosis not present

## 2016-12-29 MED ORDER — TRIAMCINOLONE ACETONIDE 10 MG/ML IJ SUSP
10.0000 mg | Freq: Once | INTRAMUSCULAR | Status: AC
  Administered 2016-12-29: 10 mg

## 2016-12-30 NOTE — Progress Notes (Signed)
Subjective:    Patient ID: [name] [name], female   DOB: 66 y.o.   MRN: [id number]   HPI patient presents with painful callus on the fifth metatarsal right stating that she has trouble walking and it's gradually becoming more of an issue for her. States that it has been a year since we've seen her but she's had knee and hip problems and is not been able to get it    ROS      Objective:  Physical Exam neurovascular status intact with inflammatory changes around the fifth MPJ right with fluid buildup around the joint and very painful keratotic lesion     Assessment:   Plantar keratotic lesion fifth MPJ right with inflammation fluid around the joint surface      Plan:    H&P condition reviewed and capsular plantar injection administered 3 mg Kenalog 5 mg Xylocaine and went ahead today and did deep debridement of lesion with no iatrogenic bleeding and she'll be seen back ultimately possibly requiring fifth metatarsal head resection

## 2017-01-05 ENCOUNTER — Other Ambulatory Visit: Payer: Self-pay | Admitting: Physical Medicine and Rehabilitation

## 2017-01-05 DIAGNOSIS — M545 Low back pain, unspecified: Secondary | ICD-10-CM

## 2017-01-05 DIAGNOSIS — M47816 Spondylosis without myelopathy or radiculopathy, lumbar region: Secondary | ICD-10-CM | POA: Diagnosis not present

## 2017-01-16 ENCOUNTER — Inpatient Hospital Stay: Admission: RE | Admit: 2017-01-16 | Payer: Medicare Other | Source: Ambulatory Visit

## 2017-01-27 ENCOUNTER — Telehealth: Payer: Self-pay

## 2017-01-27 NOTE — Telephone Encounter (Signed)
Called patient to schedule AWV and patient declined.

## 2017-02-27 ENCOUNTER — Other Ambulatory Visit: Payer: Medicare Other

## 2017-03-31 ENCOUNTER — Other Ambulatory Visit: Payer: Medicaid Other

## 2017-04-14 ENCOUNTER — Other Ambulatory Visit: Payer: Medicaid Other

## 2017-04-21 ENCOUNTER — Inpatient Hospital Stay
Admission: RE | Admit: 2017-04-21 | Discharge: 2017-04-21 | Disposition: A | Discharge status: Home / Self Care | Payer: Medicaid Other | Source: Ambulatory Visit | Attending: Physical Medicine and Rehabilitation | Admitting: Physical Medicine and Rehabilitation

## 2017-05-03 ENCOUNTER — Telehealth: Payer: Self-pay

## 2017-05-03 NOTE — Telephone Encounter (Signed)
Encounter created in error

## 2017-05-03 NOTE — Telephone Encounter (Signed)
Received faxed request from [employer] for paperwork to be completed in order to process medical supplies.  Call to pt.  Pt unaware of request.  Advised pt that if she does need paperwork completed, she would need office visit as she has not been seen here in over a year.  Pt will contact medical supply company and call back.

## 2017-05-28 ENCOUNTER — Other Ambulatory Visit (INDEPENDENT_AMBULATORY_CARE_PROVIDER_SITE_OTHER): Payer: Self-pay | Admitting: Physician Assistant

## 2017-06-28 ENCOUNTER — Other Ambulatory Visit (INDEPENDENT_AMBULATORY_CARE_PROVIDER_SITE_OTHER): Payer: Self-pay | Admitting: Physician Assistant

## 2017-07-28 ENCOUNTER — Other Ambulatory Visit (INDEPENDENT_AMBULATORY_CARE_PROVIDER_SITE_OTHER): Payer: Self-pay | Admitting: Physician Assistant

## 2017-09-06 ENCOUNTER — Encounter (INDEPENDENT_AMBULATORY_CARE_PROVIDER_SITE_OTHER): Payer: Self-pay | Admitting: Orthopedic Surgery

## 2017-09-06 ENCOUNTER — Ambulatory Visit (INDEPENDENT_AMBULATORY_CARE_PROVIDER_SITE_OTHER): Payer: Self-pay

## 2017-09-06 ENCOUNTER — Ambulatory Visit (INDEPENDENT_AMBULATORY_CARE_PROVIDER_SITE_OTHER): Payer: Medicaid Other | Admitting: Orthopedic Surgery

## 2017-09-06 VITALS — Ht 65.0 in | Wt 326.0 lb

## 2017-09-06 DIAGNOSIS — Z6841 Body Mass Index (BMI) 40.0 and over, adult: Secondary | ICD-10-CM

## 2017-09-06 DIAGNOSIS — M545 Low back pain: Secondary | ICD-10-CM | POA: Diagnosis not present

## 2017-09-06 DIAGNOSIS — M25561 Pain in right knee: Secondary | ICD-10-CM | POA: Diagnosis not present

## 2017-09-06 DIAGNOSIS — G8929 Other chronic pain: Secondary | ICD-10-CM

## 2017-09-06 NOTE — Progress Notes (Signed)
Office Visit Note   Patient: [name] [name]           Date of Birth: [birth year]           MRN: [id number] Visit Date: 09/06/2017              Requested by: Shade FloodGreene, Jeffrey R, MD [address] [place], [place] [id number] PCP: Shade FloodGreene, Jeffrey R, MD  Chief Complaint  Patient presents with  . Right Knee - Pain      HPI: Patient is a 67 year old woman who presents complaining of lateral and medial patella pain right knee she is status post bilateral total knee arthroplasties BMI greater than 50 she had her total knee replacements by Dr. Eulah PontMurphy.  She currently ambulates with a cane but she states it hurts to walk and bend.  Assessment & Plan: Visit Diagnoses:  1. Acute pain of right knee   2. Chronic bilateral low back pain without sciatica   3. Chronic pain of right knee   4. Body mass index 50.0-59.9, adult (HCC)   5. Morbid obesity (HCC)     Plan: Discussed the importance of VMO strengthening she was given a prescription for her to go to the gym to have the trainer work on [employer] strengthening.  Follow-Up Instructions: Return if symptoms worsen or fail to improve.   Ortho Exam  Patient is alert, oriented, no adenopathy, well-dressed, normal affect, normal respiratory effort. Examination patient is status post bilateral total knee arthroplasties she does have an antalgic gait.  She has a negative straight leg raise bilaterally no radicular symptoms no focal motor weakness.  Her patella tracks laterally and is tender to palpation over the medial retinaculum.  There is no redness no cellulitis no effusion no signs of infection.  Imaging: No results found. No images are attached to the encounter.  Labs: Lab Results  Component Value Date   HGBA1C 6.1 (H) 06/25/2015   HGBA1C 6.2 (H) 11/04/2014   HGBA1C 6.6 (H) 12/09/2012   ESRSEDRATE 45 (H) 11/04/2014   ESRSEDRATE 120 (H) 09/09/2013   ESRSEDRATE 12 06/19/2013   CRP 0.9 11/04/2014   CRP 11.8 (H) 09/09/2013   CRP <0.5  06/19/2013   REPTSTATUS 11/08/2014 FINAL 11/05/2014   GRAMSTAIN  11/04/2014    ABUNDANT WBC PRESENT,BOTH PMN AND MONONUCLEAR FEW GRAM POSITIVE COCCI IN PAIRS    GRAMSTAIN  11/04/2014    ABUNDANT WBC PRESENT,BOTH PMN AND MONONUCLEAR FEW GRAM POSITIVE COCCI IN PAIRS    CULT  11/05/2014    20,000 COLONIES/mL PROTEUS MIRABILIS 20,000 COLONIES/mL ENTEROCOCCUS SPECIES    LABORGA PROTEUS MIRABILIS 11/05/2014   LABORGA ENTEROCOCCUS SPECIES 11/05/2014     Lab Results  Component Value Date   ALBUMIN 4.2 01/01/2016   ALBUMIN 3.2 (L) 11/05/2014   ALBUMIN 3.2 (L) 11/05/2014    Body mass index is 54.25 kg/m.  Orders:  Orders Placed This Encounter  Procedures  . XR KNEE 3 VIEW RIGHT   No orders of the defined types were placed in this encounter.    Procedures: No procedures performed  Clinical Data: No additional findings.  ROS:  All other systems negative, except as noted in the HPI. Review of Systems  Objective: Vital Signs: Ht 5\' 5"  (1.651 m)   Wt (!) 326 lb (147.9 kg)   BMI 54.25 kg/m   Specialty Comments:  No specialty comments available.  PMFS History: Patient Active Problem List   Diagnosis Date Noted  . Trigger thumb, right thumb 05/03/2016  . S/P revision  of total knee 07/02/2015  . Protein-calorie malnutrition (HCC) 11/15/2014  . Osteomyelitis of right knee region (HCC) 11/08/2014  . Complication of device 11/04/2014  . Septic arthritis (HCC) 11/04/2014  . S/P right knee arthroscopy   . UTI (urinary tract infection) 12/09/2012  . 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 Hyperlipidemia   . Hypertension    Past Medical History:  Diagnosis Date  . Arthritis    "right knee" (07/02/2015)  . Borderline diabetes   . Hyperlipidemia   . Hypertension   . Overactive bladder   . S/P right knee arthroscopy    with septic joint growing out MSSA  . Septic arthritis of knee, right (HCC)   . Wears glasses     Family History  Problem  Relation Age of Onset  . Cancer - Other Mother   . Hypertension Other     Past Surgical History:  Procedure Laterality Date  . COLONOSCOPY    . DIAGNOSTIC LAPAROSCOPY    . JOINT REPLACEMENT    . KNEE ARTHROSCOPY Right 12/09/2012   Procedure: ARTHROSCOPIC IRRIGATION AND DEBRIDEMENT RIGHT KNEE;  Surgeon: Budd Palmer, MD;  Location: [facility] OR;  Service: Orthopedics;  Laterality: Right;  . LEFT HEART CATH AND CORONARY ANGIOGRAPHY N/A 09/02/2016   Procedure: Left Heart Cath and Coronary Angiography;  Surgeon: Rinaldo Cloud, MD;  Location: [facility] INVASIVE CV LAB;  Service: Cardiovascular;  Laterality: N/A;  . 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/06/2014   Procedure: PLACEMENT OF CEMENT SPACER;  Surgeon: Loreta Ave, MD;  Location: [facility] OR;  Service: Orthopedics;  Laterality: Right;  . TOTAL KNEE ARTHROPLASTY Bilateral 2011-2012   left-right  . TOTAL KNEE REVISION Right 11/06/2014   Procedure: REMOVAL OF TOTAL COMPONENTS, EXTENSIVE  IRRIGATION AND DEBRIDEMENT  RIGHT KNEE;  Surgeon: Loreta Ave, MD;  Location: [facility] OR;  Service: Orthopedics;  Laterality: Right;  . TOTAL KNEE REVISION Right 07/02/2015  . TOTAL KNEE REVISION Right 07/02/2015   Procedure: TOTAL KNEE REVISION;  Surgeon: Loreta Ave, MD;  Location: [facility] OR;  Service: Orthopedics;  Laterality: Right;  . TUBAL LIGATION  ~ 1980   Social History   Occupational History  . Not on file  Tobacco Use  . Smoking status: Former Smoker    Packs/day: 0.25    Years: 23.00    Pack years: 5.75    Types: Cigarettes    Last attempt to quit: 08/01/1992    Years since quitting: 25.1  . Smokeless tobacco: Never Used  Substance and Sexual Activity  . Alcohol use: No  . Drug use: No  . Sexual activity: Never

## 2017-09-07 ENCOUNTER — Other Ambulatory Visit (INDEPENDENT_AMBULATORY_CARE_PROVIDER_SITE_OTHER): Payer: Self-pay | Admitting: Physician Assistant

## 2017-09-12 ENCOUNTER — Telehealth (INDEPENDENT_AMBULATORY_CARE_PROVIDER_SITE_OTHER): Payer: Self-pay | Admitting: Orthopedic Surgery

## 2017-09-12 ENCOUNTER — Other Ambulatory Visit (INDEPENDENT_AMBULATORY_CARE_PROVIDER_SITE_OTHER): Payer: Self-pay

## 2017-09-12 MED ORDER — METHOCARBAMOL 500 MG PO TABS: 500.0000 mg | ORAL_TABLET | Freq: Two times a day (BID) | ORAL | 0 refills | Status: DC | PRN

## 2017-09-12 NOTE — Telephone Encounter (Signed)
Patient called needing a Rx for the spasms she is having in her legs and feet. The number to contact patient is [phone]

## 2017-09-12 NOTE — Telephone Encounter (Signed)
Call in Robaxin 500 mg 1 p.o. twice daily as needed spasm #20

## 2017-09-12 NOTE — Telephone Encounter (Signed)
You saw this pt last week for leg pain. Hx of bilat knee replacement 2016 and 2017. Asking for rx for muscle spasms in leg please advise.

## 2017-09-13 NOTE — Telephone Encounter (Signed)
Called and sw pt to advise that this has been faxed to her pharm.

## 2017-09-16 ENCOUNTER — Other Ambulatory Visit (INDEPENDENT_AMBULATORY_CARE_PROVIDER_SITE_OTHER): Payer: Self-pay

## 2017-09-16 ENCOUNTER — Telehealth (INDEPENDENT_AMBULATORY_CARE_PROVIDER_SITE_OTHER): Payer: Self-pay | Admitting: Orthopedic Surgery

## 2017-09-16 NOTE — Telephone Encounter (Signed)
Patient called and wants [employer] to call her back she has a few questions to ask.  Please call patient to advise

## 2017-09-16 NOTE — Telephone Encounter (Signed)
I called pt and advised that in the dictation it says for trainer at gym to work on VMO strengthening and that an order has been written and stamped for her to pick up. Pt states that Dr. Lajoyce Cornersuda wrote additional things in the order that she had and wants that reflected on this order. I advised that the dictation did not mention anything else so I will hold and ask on Monday.

## 2017-09-16 NOTE — Telephone Encounter (Signed)
Patient called asked if she can get a copy of the Rx for (PT)  Patient said she left The Rx in her pocket and washed it by accident. Patient asked if she can pick up Rx sometime this morning. The number to contact patient is [phone]

## 2017-09-16 NOTE — Telephone Encounter (Signed)
Pt found rx does not need it.

## 2017-09-16 NOTE — Telephone Encounter (Signed)
Patient called advised she found her Rx it was down in her purse. Patient said to disregard previous message from her.

## 2017-09-16 NOTE — Telephone Encounter (Signed)
noted 

## 2017-10-05 DIAGNOSIS — I10 Essential (primary) hypertension: Secondary | ICD-10-CM | POA: Diagnosis not present

## 2017-10-05 DIAGNOSIS — R29810 Facial weakness: Secondary | ICD-10-CM | POA: Diagnosis not present

## 2017-10-05 DIAGNOSIS — R531 Weakness: Secondary | ICD-10-CM | POA: Diagnosis not present

## 2017-10-06 ENCOUNTER — Observation Stay (HOSPITAL_COMMUNITY): Payer: 59

## 2017-10-06 ENCOUNTER — Inpatient Hospital Stay (HOSPITAL_COMMUNITY): Payer: 59

## 2017-10-06 ENCOUNTER — Encounter (HOSPITAL_COMMUNITY): Payer: Self-pay | Admitting: Emergency Medicine

## 2017-10-06 ENCOUNTER — Emergency Department (HOSPITAL_COMMUNITY): Payer: 59

## 2017-10-06 ENCOUNTER — Inpatient Hospital Stay (HOSPITAL_COMMUNITY)
Admission: EM | Admit: 2017-10-06 | Discharge: 2017-10-12 | DRG: 065 | Disposition: A | Discharge status: Skilled Nursing Facility | Payer: 59 | Attending: Internal Medicine | Admitting: Internal Medicine

## 2017-10-06 DIAGNOSIS — E785 Hyperlipidemia, unspecified: Secondary | ICD-10-CM | POA: Diagnosis present

## 2017-10-06 DIAGNOSIS — R279 Unspecified lack of coordination: Secondary | ICD-10-CM | POA: Diagnosis not present

## 2017-10-06 DIAGNOSIS — R0602 Shortness of breath: Secondary | ICD-10-CM

## 2017-10-06 DIAGNOSIS — I639 Cerebral infarction, unspecified: Secondary | ICD-10-CM | POA: Diagnosis present

## 2017-10-06 DIAGNOSIS — I110 Hypertensive heart disease with heart failure: Secondary | ICD-10-CM | POA: Diagnosis present

## 2017-10-06 DIAGNOSIS — I6300 Cerebral infarction due to thrombosis of unspecified precerebral artery: Secondary | ICD-10-CM | POA: Diagnosis not present

## 2017-10-06 DIAGNOSIS — Z96653 Presence of artificial knee joint, bilateral: Secondary | ICD-10-CM | POA: Diagnosis present

## 2017-10-06 DIAGNOSIS — I5022 Chronic systolic (congestive) heart failure: Secondary | ICD-10-CM | POA: Diagnosis present

## 2017-10-06 DIAGNOSIS — Z87891 Personal history of nicotine dependence: Secondary | ICD-10-CM | POA: Diagnosis not present

## 2017-10-06 DIAGNOSIS — R29704 NIHSS score 4: Secondary | ICD-10-CM | POA: Diagnosis present

## 2017-10-06 DIAGNOSIS — R4781 Slurred speech: Secondary | ICD-10-CM | POA: Diagnosis present

## 2017-10-06 DIAGNOSIS — N898 Other specified noninflammatory disorders of vagina: Secondary | ICD-10-CM | POA: Diagnosis present

## 2017-10-06 DIAGNOSIS — E1151 Type 2 diabetes mellitus with diabetic peripheral angiopathy without gangrene: Secondary | ICD-10-CM | POA: Diagnosis present

## 2017-10-06 DIAGNOSIS — R29810 Facial weakness: Secondary | ICD-10-CM | POA: Diagnosis present

## 2017-10-06 DIAGNOSIS — R531 Weakness: Secondary | ICD-10-CM | POA: Diagnosis present

## 2017-10-06 DIAGNOSIS — R079 Chest pain, unspecified: Secondary | ICD-10-CM

## 2017-10-06 DIAGNOSIS — I10 Essential (primary) hypertension: Secondary | ICD-10-CM | POA: Diagnosis present

## 2017-10-06 DIAGNOSIS — I160 Hypertensive urgency: Secondary | ICD-10-CM | POA: Diagnosis not present

## 2017-10-06 DIAGNOSIS — I63311 Cerebral infarction due to thrombosis of right middle cerebral artery: Secondary | ICD-10-CM | POA: Diagnosis not present

## 2017-10-06 DIAGNOSIS — E782 Mixed hyperlipidemia: Secondary | ICD-10-CM

## 2017-10-06 DIAGNOSIS — Z79899 Other long term (current) drug therapy: Secondary | ICD-10-CM

## 2017-10-06 DIAGNOSIS — Z7982 Long term (current) use of aspirin: Secondary | ICD-10-CM

## 2017-10-06 DIAGNOSIS — Z743 Need for continuous supervision: Secondary | ICD-10-CM | POA: Diagnosis not present

## 2017-10-06 DIAGNOSIS — Z6841 Body Mass Index (BMI) 40.0 and over, adult: Secondary | ICD-10-CM | POA: Diagnosis not present

## 2017-10-06 DIAGNOSIS — G8194 Hemiplegia, unspecified affecting left nondominant side: Secondary | ICD-10-CM | POA: Diagnosis present

## 2017-10-06 DIAGNOSIS — I6381 Other cerebral infarction due to occlusion or stenosis of small artery: Secondary | ICD-10-CM | POA: Diagnosis not present

## 2017-10-06 DIAGNOSIS — R109 Unspecified abdominal pain: Secondary | ICD-10-CM

## 2017-10-06 DIAGNOSIS — M25512 Pain in left shoulder: Secondary | ICD-10-CM | POA: Diagnosis present

## 2017-10-06 DIAGNOSIS — D509 Iron deficiency anemia, unspecified: Secondary | ICD-10-CM

## 2017-10-06 LAB — I-STAT CHEM 8, ED
BUN: 13 mg/dL (ref 8–23)
CHLORIDE: 110 mmol/L (ref 98–111)
CREATININE: 0.8 mg/dL (ref 0.44–1.00)
Calcium, Ion: 1.18 mmol/L (ref 1.15–1.40)
Glucose, Bld: 99 mg/dL (ref 70–99)
HCT: 40 % (ref 36.0–46.0)
Hemoglobin: 13.6 g/dL (ref 12.0–15.0)
POTASSIUM: 4.3 mmol/L (ref 3.5–5.1)
Sodium: 143 mmol/L (ref 135–145)
TCO2: 24 mmol/L (ref 22–32)

## 2017-10-06 LAB — COMPREHENSIVE METABOLIC PANEL
ALK PHOS: 95 U/L (ref 38–126)
ALT: 18 U/L (ref 0–44)
AST: 23 U/L (ref 15–41)
Albumin: 3.8 g/dL (ref 3.5–5.0)
Anion gap: 10 (ref 5–15)
BUN: 13 mg/dL (ref 8–23)
CALCIUM: 9.9 mg/dL (ref 8.9–10.3)
CHLORIDE: 109 mmol/L (ref 98–111)
CO2: 24 mmol/L (ref 22–32)
CREATININE: 0.91 mg/dL (ref 0.44–1.00)
GFR calc non Af Amer: >60 mL/min (ref >60)
Glucose, Bld: 100 mg/dL — ABNORMAL HIGH (ref 70–99)
Potassium: 4.4 mmol/L (ref 3.5–5.1)
SODIUM: 143 mmol/L (ref 135–145)
Total Bilirubin: 0.4 mg/dL (ref 0.3–1.2)
Total Protein: 7.3 g/dL (ref 6.5–8.1)

## 2017-10-06 LAB — DIFFERENTIAL
Abs Immature Granulocytes: 0 10*3/uL (ref 0.0–0.1)
BASOS ABS: 0.1 10*3/uL (ref 0.0–0.1)
Basophils Relative: 1 %
EOS PCT: 1 %
Eosinophils Absolute: 0.1 10*3/uL (ref 0.0–0.7)
IMMATURE GRANULOCYTES: 0 %
Lymphocytes Relative: 32 %
Lymphs Abs: 2.3 10*3/uL (ref 0.7–4.0)
MONO ABS: 0.8 10*3/uL (ref 0.1–1.0)
Monocytes Relative: 11 %
NEUTROS PCT: 55 %
Neutro Abs: 4 10*3/uL (ref 1.7–7.7)

## 2017-10-06 LAB — IRON AND TIBC
Iron: 30 ug/dL (ref 28–170)
Saturation Ratios: 5 % — ABNORMAL LOW (ref 10.4–31.8)
TIBC: 582 ug/dL — AB (ref 250–450)
UIBC: 552 ug/dL

## 2017-10-06 LAB — ECHOCARDIOGRAM COMPLETE
Height: 65 in
WEIGHTICAEL: 4800 [oz_av]

## 2017-10-06 LAB — CBC
HCT: 40.7 % (ref 36.0–46.0)
Hemoglobin: 11.6 g/dL — ABNORMAL LOW (ref 12.0–15.0)
MCH: 22.1 pg — ABNORMAL LOW (ref 26.0–34.0)
MCHC: 28.5 g/dL — AB (ref 30.0–36.0)
MCV: 77.5 fL — ABNORMAL LOW (ref 78.0–100.0)
Platelets: 278 10*3/uL (ref 150–400)
RBC: 5.25 MIL/uL — ABNORMAL HIGH (ref 3.87–5.11)
RDW: 18.4 % — AB (ref 11.5–15.5)
WBC: 7.2 10*3/uL (ref 4.0–10.5)

## 2017-10-06 LAB — CBG MONITORING, ED: Glucose-Capillary: 92 mg/dL (ref 70–99)

## 2017-10-06 LAB — FERRITIN: FERRITIN: 6 ng/mL — AB (ref 11–307)

## 2017-10-06 LAB — RETICULOCYTES
RBC.: 5.3 MIL/uL — AB (ref 3.87–5.11)
RETIC CT PCT: 1.6 % (ref 0.4–3.1)
Retic Count, Absolute: 84.8 10*3/uL (ref 19.0–186.0)

## 2017-10-06 LAB — TROPONIN I: Troponin I: <0.03 ng/mL (ref <0.03)

## 2017-10-06 LAB — APTT: aPTT: 29 seconds (ref 24–36)

## 2017-10-06 LAB — VITAMIN B12: Vitamin B-12: 644 pg/mL (ref 180–914)

## 2017-10-06 LAB — I-STAT TROPONIN, ED: Troponin i, poc: 0.01 ng/mL (ref 0.00–0.08)

## 2017-10-06 LAB — PROTIME-INR
INR: 1
Prothrombin Time: 13.1 seconds (ref 11.4–15.2)

## 2017-10-06 LAB — FOLATE: Folate: 17.2 ng/mL (ref >5.9)

## 2017-10-06 LAB — BRAIN NATRIURETIC PEPTIDE: B Natriuretic Peptide: 15.3 pg/mL (ref 0.0–100.0)

## 2017-10-06 MED ORDER — MIRABEGRON ER 25 MG PO TB24
50.0000 mg | ORAL_TABLET | Freq: Every day | ORAL | Status: DC
  Administered 2017-10-06 – 2017-10-12 (×7): 50 mg via ORAL
  Filled 2017-10-06: qty 2
  Filled 2017-10-06: qty 1
  Filled 2017-10-06 (×5): qty 2

## 2017-10-06 MED ORDER — ASPIRIN 300 MG RE SUPP: 300.0000 mg | Freq: Every day | RECTAL | Status: DC

## 2017-10-06 MED ORDER — LORAZEPAM 2 MG/ML IJ SOLN
1.0000 mg | Freq: Once | INTRAMUSCULAR | Status: AC
  Administered 2017-10-06: 1 mg via INTRAVENOUS
  Filled 2017-10-06: qty 1

## 2017-10-06 MED ORDER — ATORVASTATIN CALCIUM 40 MG PO TABS
40.0000 mg | ORAL_TABLET | Freq: Every day | ORAL | Status: DC
  Administered 2017-10-06 – 2017-10-07 (×2): 40 mg via ORAL
  Filled 2017-10-06 (×2): qty 1

## 2017-10-06 MED ORDER — LABETALOL HCL 5 MG/ML IV SOLN
20.0000 mg | INTRAVENOUS | Status: DC | PRN
  Administered 2017-10-06: 20 mg via INTRAVENOUS
  Filled 2017-10-06: qty 4

## 2017-10-06 MED ORDER — PERFLUTREN LIPID MICROSPHERE
1.0000 mL | INTRAVENOUS | Status: AC | PRN
  Administered 2017-10-06: 3 mL via INTRAVENOUS
  Filled 2017-10-06: qty 10

## 2017-10-06 MED ORDER — STROKE: EARLY STAGES OF RECOVERY BOOK
Freq: Once | Status: AC
  Administered 2017-10-06: 09:00:00
  Filled 2017-10-06: qty 1

## 2017-10-06 MED ORDER — ASPIRIN EC 325 MG PO TBEC
325.0000 mg | DELAYED_RELEASE_TABLET | Freq: Every day | ORAL | Status: DC
  Administered 2017-10-06 – 2017-10-07 (×2): 325 mg via ORAL
  Filled 2017-10-06 (×2): qty 1

## 2017-10-06 MED ORDER — METOPROLOL SUCCINATE ER 100 MG PO TB24
100.0000 mg | ORAL_TABLET | Freq: Every day | ORAL | Status: DC
  Administered 2017-10-06 – 2017-10-12 (×7): 100 mg via ORAL
  Filled 2017-10-06 (×7): qty 1

## 2017-10-06 MED ORDER — ENOXAPARIN SODIUM 40 MG/0.4ML ~~LOC~~ SOLN
40.0000 mg | SUBCUTANEOUS | Status: DC
  Administered 2017-10-06 – 2017-10-12 (×7): 40 mg via SUBCUTANEOUS
  Filled 2017-10-06 (×7): qty 0.4

## 2017-10-06 NOTE — Evaluation (Signed)
Physical Therapy Evaluation Patient Details Name: Ronya Gilcrest MRN: [id number] DOB: Jun 19, 1950 Today's Date: 10/06/2017   History of Present Illness  67 yo female with onset of R basal ganglia stroke and L LE hemiparesis was admitted, noted chest tightness and SOB initially as well as EF 49%.  PMHx:  atherosclerosis, CAD, borderline DM, R knee sepsis, HTN83 yo female with onset of R basal ganglia stroke and L LE hemiparesis was admitted, noted chest tightness and SOB initially as well as EF 49%.  PMHx:  atherosclerosis, CAD, borderline DM, R knee sepsis, HTN  Clinical Impression  Pt was seen for evaluation of mobility with progression from standing bedside to the chair, but unsteady on her LLE.  Pt is typically home with quad cane or rollator to walk depending on her needs. Follow acutely for gait training, strengthening to LE's and balance with reduction of her AD as needed.  Daughter is there with pt 24/7 and so will hope for her recovery to allow her to go directly home.  However she is not currently safe enough to do this.  Pt may decline SNF placement.    Follow Up Recommendations SNF    Equipment Recommendations  None recommended by PT    Recommendations for Other Services       Precautions / Restrictions Precautions Precautions: Fall Precaution Comments: home with daughter at all times Restrictions Weight Bearing Restrictions: No      Mobility  Bed Mobility Overal bed mobility: Needs Assistance Bed Mobility: Supine to Sit     Supine to sit: Mod assist     General bed mobility comments: pt uses elevated HOB and bedrail to sit up side of bed  Transfers Overall transfer level: Needs assistance Equipment used: Rolling walker (2 wheeled);1 person hand held assist Transfers: Sit to/from Stand Sit to Stand: Min guard;Min assist            Ambulation/Gait             General Gait Details: unable   Stairs            Wheelchair Mobility    Modified  Rankin (Stroke Patients Only)       Balance Overall balance assessment: History of Falls;Needs assistance Sitting-balance support: Feet supported Sitting balance-Leahy Scale: Fair     Standing balance support: Bilateral upper extremity supported;During functional activity Standing balance-Leahy Scale: Poor                               Pertinent Vitals/Pain Pain Assessment: No/denies pain    Home Living Family/patient expects to be discharged to:: Private residence Living Arrangements: Children Available Help at Discharge: Family;Available 24 hours/day Type of Home: House Home Access: Level entry     Home Layout: One level Home Equipment: Walker - 4 wheels;Cane - quad Additional Comments: pt reports her cane is her usual means of walking    Prior Function Level of Independence: Independent with assistive device(s)         Comments: has been able to walk and perform ADL's alone at home.     Hand Dominance        Extremity/Trunk Assessment   Upper Extremity Assessment Upper Extremity Assessment: Defer to OT evaluation    Lower Extremity Assessment Lower Extremity Assessment: LLE deficits/detail LLE Deficits / Details: strength was 3+ to 4- hip and knee LLE Coordination: decreased fine motor;decreased gross motor    Cervical / Trunk Assessment Cervical /  Trunk Assessment: Normal  Communication   Communication: No difficulties  Cognition Arousal/Alertness: Awake/alert Behavior During Therapy: WFL for tasks assessed/performed Overall Cognitive Status: Within Functional Limits for tasks assessed                                 General Comments: Pt is aware that her L side is weaker      General Comments General comments (skin integrity, edema, etc.): pt has a limited tolerance for standing and control of L side, able to assist to the chair but declined to have feet elevated    Exercises     Assessment/Plan    PT Assessment  Patient needs continued PT services  PT Problem List Decreased strength;Decreased range of motion;Decreased activity tolerance;Decreased balance;Decreased mobility;Decreased coordination;Decreased safety awareness;Obesity;Cardiopulmonary status limiting activity       PT Treatment Interventions DME instruction;Gait training;Functional mobility training;Therapeutic activities;Therapeutic exercise;Balance training;Neuromuscular re-education;Patient/family education    PT Goals (Current goals can be found in the Care Plan section)  Acute Rehab PT Goals Patient Stated Goal: to go directly home PT Goal Formulation: With patient Time For Goal Achievement: 10/20/17 Potential to Achieve Goals: Good    Frequency Min 2X/week   Barriers to discharge Decreased caregiver support will need 24/7 assistance for all mobility    Co-evaluation               AM-PAC PT "6 Clicks" Daily Activity  Outcome Measure Difficulty turning over in bed (including adjusting bedclothes, sheets and blankets)?: A Little Difficulty moving from lying on back to sitting on the side of the bed? : Unable Difficulty sitting down on and standing up from a chair with arms (e.g., wheelchair, bedside commode, etc,.)?: Unable Help needed moving to and from a bed to chair (including a wheelchair)?: A Little Help needed walking in hospital room?: A Lot Help needed climbing 3-5 steps with a railing? : Total 6 Click Score: 11    End of Session Equipment Utilized During Treatment: Gait belt Activity Tolerance: Patient limited by fatigue(mm weakness) Patient left: in chair;with call bell/phone within reach Nurse Communication: Mobility status PT Visit Diagnosis: Unsteadiness on feet (R26.81);Muscle weakness (generalized) (M62.81);Difficulty in walking, not elsewhere classified (R26.2);Hemiplegia and hemiparesis Hemiplegia - Right/Left: Left Hemiplegia - dominant/non-dominant: Non-dominant Hemiplegia - caused by: Cerebral  infarction    Time: 1525-1550 PT Time Calculation (min) (ACUTE ONLY): 25 min   Charges:   PT Evaluation $PT Eval Moderate Complexity: 1 Mod PT Treatments $Therapeutic Activity: 8-22 mins       Ivar DrapeRuth E Doninique Lwin 10/06/2017, 6:13 PM   Samul Dadauth Marcianna Daily, PT MS Acute Rehab Dept. Number: [facility] [phone] and [facility] [phone]

## 2017-10-06 NOTE — Progress Notes (Addendum)
STROKE TEAM PROGRESS NOTE  HPI: ( Dr Amada Jupiter )[name] [name] is a 67 y.o. female with a history of hypertension, hyperlipidemia, borderline diabetes who presents with left-sided weakness that began on Sunday.10/02/17  She states that this has been a static deficit not really improving or getting worse since the onset.  She had some double vision that was present yesterday, but is not there today.  She denies numbness, vision change other than the diplopia, nausea, vomiting.  LKW: 10/02/17 morning tpa given?: no, out of window  INTERVAL HISTORY No family is at the bedside.  She remains in the ED. MRI positive for stroke. She reports sx started on Sunday. She is for admission.   Vitals:   10/06/17 0715 10/06/17 0730 10/06/17 0800 10/06/17 0830  BP: (!) 103/37 117/61 139/84 (!) 171/72  Pulse: 88 88 91 78  Resp: 16 16 (!) 22 19  Temp:      TempSrc:      SpO2: 95% 97% 95% 99%  Weight:      Height:        CBC:  Recent Labs  Lab 10/06/17 0042 10/06/17 0119  WBC 7.2  --   NEUTROABS 4.0  --   HGB 11.6* 13.6  HCT 40.7 40.0  MCV 77.5*  --   PLT 278  --     Basic Metabolic Panel:  Recent Labs  Lab 10/06/17 0042 10/06/17 0119  NA 143 143  K 4.4 4.3  CL 109 110  CO2 24  --   GLUCOSE 100* 99  BUN 13 13  CREATININE 0.91 0.80  CALCIUM 9.9  --    Lipid Panel:     Component Value Date/Time   CHOL 143 01/01/2016 1617   TRIG 107 01/01/2016 1617   HDL 51 01/01/2016 1617   CHOLHDL 2.8 01/01/2016 1617   VLDL 21 01/01/2016 1617   LDLCALC 71 01/01/2016 1617   HgbA1c:  Lab Results  Component Value Date   HGBA1C 6.1 (H) 06/25/2015   Urine Drug Screen: No results found for: LABOPIA, COCAINSCRNUR, LABBENZ, AMPHETMU, THCU, LABBARB  Alcohol Level No results found for: [facility]  IMAGING Dg Chest 2 View  Result Date: 10/06/2017 CLINICAL DATA:  Shortness of breath EXAM: CHEST - 2 VIEW COMPARISON:  11/26/2015 FINDINGS: No pleural effusion. Cardiomegaly with mild vascular congestion. No  focal airspace disease. No pneumothorax. Degenerative changes of the spine. IMPRESSION: Mild cardiomegaly with vascular congestion. Electronically Signed   By: Jasmine Pang M.D.   On: 10/06/2017 01:37   Ct Head Wo Contrast  Result Date: 10/06/2017 CLINICAL DATA:  Slurred speech, LEFT-sided weakness for 3 days. History of hypertension, hyperlipidemia. EXAM: CT HEAD WITHOUT CONTRAST TECHNIQUE: Contiguous axial images were obtained from the base of the skull through the vertex without intravenous contrast. COMPARISON:  None. FINDINGS: BRAIN: No intraparenchymal hemorrhage, mass effect nor midline shift. Age indeterminate LEFT basal ganglial lacunar infarct. Mild parenchymal brain volume loss. No hydrocephalus. Patchy to confluent supratentorial white matter hypodensities. Old small RIGHT cerebellar infarct. No acute large vascular territory infarcts. No abnormal extra-axial fluid collections. Basal cisterns are patent. VASCULAR: Moderate to severe calcific atherosclerosis of the carotid siphons. SKULL: No skull fracture. No significant scalp soft tissue swelling. SINUSES/ORBITS: Partially imaged small LEFT maxillary mucosal retention cyst. Mastoid air cells are well aerated.The included ocular globes and orbital contents are non-suspicious. OTHER: None. IMPRESSION: 1. Age indeterminate LEFT basal ganglia lacunar infarct. 2. Moderate to severe chronic small vessel ischemic changes. Old small RIGHT cerebellar infarct. 3. Mild  parenchymal brain volume loss for age. Electronically Signed   By: Awilda Metro M.D.   On: 10/06/2017 02:23   Mr Brain Wo Contrast  Result Date: 10/06/2017 CLINICAL DATA:  Left-sided weakness beginning 4 days ago. Speech disturbance. EXAM: MRI HEAD WITHOUT CONTRAST MRA HEAD WITHOUT CONTRAST TECHNIQUE: Multiplanar, multiecho pulse sequences of the brain and surrounding structures were obtained without intravenous contrast. Angiographic images of the head were obtained using MRA  technique without contrast. COMPARISON:  CT same day FINDINGS: MRI HEAD FINDINGS Brain: 1-1.5 cm acute infarction in the right basal ganglia and radiating white matter tracts. No other acute or subacute infarction. Elsewhere, there are old small vessel infarctions within the inferior right cerebellum, thalami and cerebral hemispheric deep and subcortical white matter. No large vessel territory infarction. No mass lesion, hemorrhage hydrocephalus or extra-axial collection. Vascular: Major vessels at the base of the brain show flow. Skull and upper cervical spine: Negative Sinuses/Orbits: Clear/normal Other: None MRA HEAD FINDINGS Both internal carotid arteries are patent through the skull base and siphon regions. No siphon stenosis. The anterior and middle cerebral vessels are patent without proximal stenosis, aneurysm or vascular malformation. Both vertebral arteries are patent with the left being dominant. There is atherosclerotic narrowing of the V4 segment of the right vertebral artery, but the vessel does remain patent to the basilar. Both posterior inferior cerebellar arteries show flow. The basilar artery shows mild atherosclerotic irregularity but no stenosis. Superior cerebellar and posterior cerebral arteries show flow. IMPRESSION: Acute 1-1.5 cm right basal ganglia/radiating white matter tract infarction. No swelling or hemorrhage. Chronic small-vessel ischemic changes elsewhere throughout the brain as outlined above. No anterior circulation intracranial large or medium vessel abnormality. Atherosclerotic narrowing of the V4 segment of the right vertebral artery which is non dominant. No other posterior circulation abnormal finding. Electronically Signed   By: Paulina Fusi M.D.   On: 10/06/2017 11:14   Mr Maxine Glenn Head Wo Contrast  Result Date: 10/06/2017 CLINICAL DATA:  Left-sided weakness beginning 4 days ago. Speech disturbance. EXAM: MRI HEAD WITHOUT CONTRAST MRA HEAD WITHOUT CONTRAST TECHNIQUE:  Multiplanar, multiecho pulse sequences of the brain and surrounding structures were obtained without intravenous contrast. Angiographic images of the head were obtained using MRA technique without contrast. COMPARISON:  CT same day FINDINGS: MRI HEAD FINDINGS Brain: 1-1.5 cm acute infarction in the right basal ganglia and radiating white matter tracts. No other acute or subacute infarction. Elsewhere, there are old small vessel infarctions within the inferior right cerebellum, thalami and cerebral hemispheric deep and subcortical white matter. No large vessel territory infarction. No mass lesion, hemorrhage hydrocephalus or extra-axial collection. Vascular: Major vessels at the base of the brain show flow. Skull and upper cervical spine: Negative Sinuses/Orbits: Clear/normal Other: None MRA HEAD FINDINGS Both internal carotid arteries are patent through the skull base and siphon regions. No siphon stenosis. The anterior and middle cerebral vessels are patent without proximal stenosis, aneurysm or vascular malformation. Both vertebral arteries are patent with the left being dominant. There is atherosclerotic narrowing of the V4 segment of the right vertebral artery, but the vessel does remain patent to the basilar. Both posterior inferior cerebellar arteries show flow. The basilar artery shows mild atherosclerotic irregularity but no stenosis. Superior cerebellar and posterior cerebral arteries show flow. IMPRESSION: Acute 1-1.5 cm right basal ganglia/radiating white matter tract infarction. No swelling or hemorrhage. Chronic small-vessel ischemic changes elsewhere throughout the brain as outlined above. No anterior circulation intracranial large or medium vessel abnormality. Atherosclerotic narrowing of the  V4 segment of the right vertebral artery which is non dominant. No other posterior circulation abnormal finding. Electronically Signed   By: Paulina Fusi M.D.   On: 10/06/2017 11:14    PHYSICAL EXAM  morbidly  obese middle-aged African-American lady not in distress. . Afebrile. Head is nontraumatic. Neck is supple without bruit.    Cardiac exam no murmur or gallop. Lungs are clear to auscultation. Distal pulses are well felt. Neurological Exam :  Awake alert oriented to time place and person. Mild dysarthria. No aphasia. Extraocular moments are full range without nystagmus. Blinks to threat bilaterally. Fundi were not visualized. Vision acuity seems adequate. Mild left lower facial weakness. Tongue midline. Left hemiplegia with 3/5 left upper extremity strength with significant weakness of grip and intrinsic hand muscles. 4/5 left lower extremity strength with weakness of hip flexors and ankle dorsiflexors. Sensation is diminished on the left compared to the right. Coordination is impaired on the left. Deep tendon reflexes are depressed on the left and normal on the right. Left plantar upgoing right downgoing. Gait not tested.  ASSESSMENT/PLAN [name] is a 67 y.o. female with history of HTN, HLD, borderline DB, presenting with L HP that started on Sunday, developed CP.   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  Moderate to severe small vessel disease.  Old right cerebellar infarct.  Atrophy.  MRI acute right basal ganglia white matter infarct.  Chronic small vessel disease.  MRA narrowing R VA V4  Carotid Doppler  pending   2D Echo  pending   LDL pending   HgbA1c pending   Lovenox 40 mg sq daily for VTE prophylaxis Diet Order            Diet NPO time specified  Diet effective now               aspirin 81 mg daily prior to admission, now on No antithrombotic. Added aspirin 300 supp or 325 po.  Therapy recommendations:  pending   Disposition:  pending  (Lived w/ dtr  PTA)  Hypertension  Stable . Permissive hypertension (OK if < 220/120) but gradually normalize in 5-7 days . Long-term BP goal normotensive  Hyperlipidemia  Home meds:  lipitor 40, resumed in hospital  LDL pending , goal < 70  Continue statin at discharge  Pre-Diabetes type II  HgbA1c pending, goal < 7.0  Other Stroke Risk Factors  Advanced age  Former Cigarette smoker, quit 25 yrs ago  Morbid Obesity, Body mass index is 49.92 kg/m., recommend weight loss, diet and exercise as appropriate   Needs OP eval for OSA  Other Active Problems  Atypical CP  Hospital day # 0  Annie Main, MSN, APRN, ANVP-BC, AGPCNP-BC Advanced Practice Stroke Nurse [facility] See Amion for Schedule & Pager information 10/06/2017 11:28 AM  I have personally examined this patient, reviewed notes, independently viewed imaging studies, participated in medical decision making and plan of care.ROS completed by me personally and pertinent positives fully documented  I have made any additions or clarifications directly to the above note. Agree with note above. .she presented several days after onset of left hemiparesis due to right basal ganglia infarct from small vessel disease but she has multiple risk factors. Recommend dual antiplatelet therapy for 3 weeks followed by aspirin alone. Aggressive risk factor modification. Continue ongoing stroke workup.  She may consider possible participation in the Sleep Smart  Stroke prevention study as she has strong possibility of having sleep apnea given her body habitus. Discussed with Dr.Abrol. Greater than 50% and in this 35 minute visit was spent on counseling and coordination of care about her lacunar infarct in discussion about stroke prevention and treatment and answering questions  Delia HeadyPramod Raunak Antuna, MD Medical Director [facility] Pager: [phone] 10/06/2017 5:28 PM  To contact Stroke Continuity provider, please refer to  [email or web address]. After hours, contact General Neurology

## 2017-10-06 NOTE — ED Triage Notes (Signed)
BIB [facility] from home with c/o of slurred speech, and left sided weakness that began Sunday morning. Per EMS pt tried to schedule an appointment with her PCP rather than come to the hospital.

## 2017-10-06 NOTE — Progress Notes (Signed)
  Echocardiogram 2D Echocardiogram with definity has been performed.  Leta JunglingCooper, Anthoney Sheppard M 10/06/2017, 1:40 PM

## 2017-10-06 NOTE — Consult Note (Signed)
Neurology Consultation Reason for Consult: Stroke Referring Physician: Preston FleetingGlick, D  CC: Left-sided weakness  History is obtained from: Patient  HPI: [name] [name] is a 67 y.o. female with a history of hypertension, hyperlipidemia, borderline diabetes who presents with left-sided weakness that began on Sunday.  She states that this has been a static deficit not really improving or getting worse since the onset.  She had some double vision that was present yesterday, but is not there today.  She denies numbness, vision change other than the diplopia, nausea, vomiting.   LKW: Sunday morning tpa given?: no, out of window   ROS: A 14 point ROS was performed and is negative except as noted in the HPI.    Past Medical History:  Diagnosis Date  . Arthritis    "right knee" (07/02/2015)  . Borderline diabetes   . Hyperlipidemia   . Hypertension   . Overactive bladder   . S/P right knee arthroscopy    with septic joint growing out MSSA  . Septic arthritis of knee, right (HCC)   . Wears glasses      Family History  Problem Relation Age of Onset  . Cancer - Other Mother   . Hypertension Other      Social History:  reports that she quit smoking about 25 years ago. Her smoking use included cigarettes. She has a 5.75 pack-year smoking history. She has never used smokeless tobacco. She reports that she does not drink alcohol or use drugs.   Exam: Current vital signs: BP (!) 179/80   Pulse 97   Temp 98 F (36.7 C) (Oral)   Resp (!) 21   Ht 5\' 5"  (1.651 m)   Wt 136.1 kg   SpO2 97%   BMI 49.92 kg/m  Vital signs in last 24 hours: Temp:  [98 F (36.7 C)] 98 F (36.7 C) (08/22 0049) Pulse Rate:  [87-130] 97 (08/22 0245) Resp:  [16-21] 21 (08/22 0245) BP: (178-206)/(80-137) 179/80 (08/22 0245) SpO2:  [94 %-99 %] 97 % (08/22 0245) Weight:  [136.1 kg] 136.1 kg (08/22 0049)   Physical Exam  Constitutional: Appears well-developed and well-nourished.  Psych: Affect appropriate to  situation Eyes: No scleral injection HENT: No OP obstrucion Head: Normocephalic.  Cardiovascular: Normal rate and regular rhythm.  Respiratory: Effort normal, non-labored breathing GI: Soft.  No distension. There is no tenderness.  Skin: WDI  Neuro: Mental Status: Patient is awake, alert, oriented to person, place, month, year, and situation. Patient is able to give a clear and coherent history. No signs of aphasia or neglect Cranial Nerves: II: Visual Fields are full. Pupils are equal, round, and reactive to light.   III,IV, VI: EOMI without ptosis or diploplia.  V: Facial sensation is symmetric to temperature VII: Facial movement with left facial droop VIII: hearing is intact to voice X: Uvula elevates symmetrically XI: Shoulder shrug is symmetric. XII: tongue is midline without atrophy or fasciculations.  Motor: Tone is normal. Bulk is normal.  4+/5 strength in the left leg 4/5 strength in left arm 5/5 in the right arm and leg Sensory: Sensation is symmetric to light touch and temperature in the arms and legs. 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  I suspect a small subcortical infarct, possibly brainstem.  She is being admitted for  therapy and secondary risk factor modification.  Recommendations: - HgbA1c, fasting lipid panel - MRI, MRA  of the brain without contrast - Frequent neuro checks - Echocardiogram - Carotid dopplers - Prophylactic therapy-Antiplatelet med: Aspirin - dose 325mg  PO or 300mg  PR - Risk factor modification - Telemetry monitoring - PT consult, OT consult, Speech consult - Stroke team to follow    Ritta Slot, MD [facility] [phone]  If 7pm- 7am, please page neurology on call as  listed in AMION.

## 2017-10-06 NOTE — Progress Notes (Signed)
Patient arrived to the unit alert and Oriented X 4. Denies pain  All questions and concerns addressed. Bed in the lowest position, alarm set and call light in reach.

## 2017-10-06 NOTE — H&P (Addendum)
[facility] History and Physical  Avril Busser ZOX:[id number] DOB: 1950-03-01 DOA: 10/06/2017  Referring physician:   PCP: Shade Flood, MD   Chief Complaint:  Possible CVA  HPI:   67 year old female with a history of hypertension, dyslipidemia, morbid obesity,Body mass index is 49.92 kg/m.,followed by Dr. Particia Jasper, HFrEF, most recent EF of 49% on stress test on 08/24/16 which showed moderate reversibility status postcardiac cath on 09/02/16 , which showed normal LV systolic function, nonobstructive CAD, EF of 55-65%,who presents to the ER today with left-sided weakness that began on Sunday. Patient noted difficulty with coordination, micrographia, slurred speech.she also complained of some diplopia which has resolved. She describes associated symptoms of chest pain, shortness of breath, chest tightness. ED course BP (!) 178/137 (BP Location: Right Wrist)   Pulse (!) 103   Temp 98 F (36.7 C) (Oral)   Resp 16   Ht 5\' 5"  (1.651 m)   Wt 136.1 kg   SpO2 99%   BMI 49.92 kg/m  Patient deemed to be outside the timeframe of thrombolytics and endovascular procedures. No cord through was called. CT of the head did not show any acute stroke. Neurology was consulted.noted to have mild decrease in strength in the left upper lower extremity. Neurology recommended admission for a full stroke workup.      Review of Systems: negative for the following  Constitutional: Denies fever, chills, diaphoresis, appetite change and fatigue.  HEENT: Denies photophobia, eye pain, redness, hearing loss, ear pain, congestion, sore throat, rhinorrhea, sneezing, mouth sores, trouble swallowing, neck pain, neck stiffness and tinnitus.  Respiratory: Denies SOB, DOE, cough, chest tightness, and wheezing.  Cardiovascular: Denies chest pain, palpitations and leg swelling.  Gastrointestinal: Denies nausea, vomiting, abdominal pain, diarrhea, constipation, blood in stool and abdominal distention.   Genitourinary: Denies dysuria, urgency, frequency, hematuria, flank pain and difficulty urinating.  Musculoskeletal: Denies myalgias, back pain, joint swelling, arthralgias and gait problem.  Skin: Denies pallor, rash and wound.  Neurological: left-sided  weakness, slurred speech, micrographia  Hematological: Denies adenopathy. Easy bruising, personal or family bleeding history  Psychiatric/Behavioral: Denies suicidal ideation, mood changes, confusion, nervousness, sleep disturbance and agitation       Past Medical History:  Diagnosis Date  . Arthritis    "right knee" (07/02/2015)  . Borderline diabetes   . Hyperlipidemia   . Hypertension   . Overactive bladder   . S/P right knee arthroscopy    with septic joint growing out MSSA  . Septic arthritis of knee, right (HCC)   . Wears glasses      Past Surgical History:  Procedure Laterality Date  . COLONOSCOPY    . DIAGNOSTIC LAPAROSCOPY    . JOINT REPLACEMENT    . KNEE ARTHROSCOPY Right 12/09/2012   Procedure: ARTHROSCOPIC IRRIGATION AND DEBRIDEMENT RIGHT KNEE;  Surgeon: Budd Palmer, MD;  Location: [facility] OR;  Service: Orthopedics;  Laterality: Right;  . LEFT HEART CATH AND CORONARY ANGIOGRAPHY N/A 09/02/2016   Procedure: Left Heart Cath and Coronary Angiography;  Surgeon: Rinaldo Cloud, MD;  Location: [facility] INVASIVE CV LAB;  Service: Cardiovascular;  Laterality: N/A;  . 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 TOTAL KNEE ARTHROPLASTY Bilateral 2011-2012   left-right  . TOTAL KNEE REVISION Right 11/06/2014   Procedure: REMOVAL OF TOTAL COMPONENTS, EXTENSIVE  IRRIGATION AND DEBRIDEMENT  RIGHT KNEE;  Surgeon: Loreta Ave,  MD;  Location: [facility] OR;  Service: Orthopedics;  Laterality: Right;  . TOTAL KNEE REVISION Right 07/02/2015  . TOTAL KNEE REVISION Right 07/02/2015   Procedure: TOTAL  KNEE REVISION;  Surgeon: Loreta Aveaniel F Murphy, MD;  [facility];  Service: Orthopedics;  Laterality: Right;  . TUBAL LIGATION  ~ 1980      Social History:  reports that she quit smoking about 25 years ago. Her smoking use included cigarettes. She has a 5.75 pack-year smoking history. She has never used smokeless tobacco. She reports that she does not drink alcohol or use drugs.    No Known Allergies  Family History  Problem Relation Age of Onset  . Cancer - Other Mother   . Hypertension Other          Prior to Admission medications   Medication Sig Start Date End Date Taking? Authorizing Provider  aspirin EC 81 MG tablet Take 81 mg by mouth daily.   Yes [provider]  Aspirin-Salicylamide-Caffeine (BC FAST PAIN RELIEF) 650-195-33.3 MG PACK Take 1 packet by mouth every 8 (eight) hours as needed (for pain.).   Yes [provider]  atorvastatin (LIPITOR) 10 MG tablet Take 10 mg by mouth daily. 06/14/16  Yes [provider]  metoprolol succinate (TOPROL-XL) 100 MG 24 hr tablet Take 100 mg by mouth daily. Take with or immediately following a meal.   Yes [provider]  MYRBETRIQ 50 MG TB24 tablet Take 50 mg by mouth daily. 08/17/16  Yes [provider]  nitroGLYCERIN (NITROSTAT) 0.4 MG SL tablet Place 0.4 mg under the tongue every 5 (five) minutes x 3 doses as needed. For chest pain. 07/30/16  Yes [provider]  trimethoprim (TRIMPEX) 100 MG tablet Take 100 mg by mouth daily. 03/15/16  Yes [provider]  amoxicillin (AMOXIL) 500 MG capsule TAKE FOUR CAPSULES BY MOUTH ONE HOUR PRIOR TO DENTAL APPOINTMENT Patient not taking: Reported on 10/06/2017 05/30/17   Cristie HemStanbery, Mary L, PA-C  atorvastatin (LIPITOR) 40 MG tablet Take 1 tablet (40 mg total) by mouth daily at 6 PM. Patient not taking: Reported on 10/06/2017 08/18/16   Shade FloodGreene, Jeffrey R, MD  methocarbamol (ROBAXIN) 500 MG tablet Take 1 tablet (500 mg total) by mouth 2 (two) times  daily between meals as needed for muscle spasms. Patient not taking: Reported on 10/06/2017 09/12/17   Nadara Mustarduda, Marcus V, MD  valsartan (DIOVAN) 160 MG tablet Take 1 tablet (160 mg total) by mouth daily. Patient not taking: Reported on 10/06/2017 08/18/16   Shade FloodGreene, Jeffrey R, MD     Physical Exam: Vitals:   10/06/17 0700 10/06/17 0715 10/06/17 0730 10/06/17 0800  BP: 121/85 (!) 103/37 117/61 139/84  Pulse: 84 88 88 91  Resp: 18 16 16  (!) 22  Temp:      TempSrc:      SpO2: 95% 95% 97% 95%  Weight:      Height:            Vitals:   10/06/17 0700 10/06/17 0715 10/06/17 0730 10/06/17 0800  BP: 121/85 (!) 103/37 117/61 139/84  Pulse: 84 88 88 91  Resp: 18 16 16  (!) 22  Temp:      TempSrc:      SpO2: 95% 95% 97% 95%  Weight:      Height:       Constitutional: NAD, calm, comfortable Eyes: PERRL, lids and conjunctivae normal ENMT: Mucous membranes are moist. Posterior pharynx clear of any exudate or lesions.Normal dentition.  Neck: normal, supple, no masses, no thyromegaly Respiratory: clear to auscultation bilaterally, no wheezing, no crackles. Normal respiratory effort. No accessory muscle use.  Cardiovascular: Regular rate and rhythm, no murmurs / rubs / gallops. No extremity edema. 2+ pedal pulses. No carotid bruits.  Abdomen: no tenderness, no masses palpated. No hepatosplenomegaly. Bowel sounds positive.  Musculoskeletal: no clubbing / cyanosis. No joint deformity upper and lower extremities. Good ROM, no contractures. Normal muscle tone.  Skin: no rashes, lesions, ulcers. No induration Neurologic: Awake and alert and oriented.  Speech is mildly dysarthric.  Mild left central facial droop present.  No diplopia elicited with full range of extraocular movements.  Mild left pronator drift.  Mild weakness of left arm and left leg with strength 4/5.  Normal strength in right arm and right leg.  Sensation is normal.  No Babinski reflex seen. Psychiatric: Normal judgment and insight. Alert  and oriented x 3. Normal mood.     Labs on Admission: I have personally reviewed following labs and imaging studies  CBC: Recent Labs  Lab 10/06/17 0042 10/06/17 0119  WBC 7.2  --   NEUTROABS 4.0  --   HGB 11.6* 13.6  HCT 40.7 40.0  MCV 77.5*  --   PLT 278  --     Basic Metabolic Panel: Recent Labs  Lab 10/06/17 0042 10/06/17 0119  NA 143 143  K 4.4 4.3  CL 109 110  CO2 24  --   GLUCOSE 100* 99  BUN 13 13  CREATININE 0.91 0.80  CALCIUM 9.9  --     GFR: Estimated Creatinine Clearance: 96.8 mL/min (by C-G formula based on SCr of 0.8 mg/dL).  Liver Function Tests: Recent Labs  Lab 10/06/17 0042  AST 23  ALT 18  ALKPHOS 95  BILITOT 0.4  PROT 7.3  ALBUMIN 3.8   No results for input(s): LIPASE, AMYLASE in the last 168 hours. No results for input(s): AMMONIA in the last 168 hours.  Coagulation Profile: Recent Labs  Lab 10/06/17 0042  INR 1.00   No results for input(s): DDIMER in the last 72 hours.  Cardiac Enzymes: No results for input(s): CKTOTAL, CKMB, CKMBINDEX, TROPONINI in the last 168 hours.  BNP (last 3 results) No results for input(s): PROBNP in the last 8760 hours.  HbA1C: No results for input(s): HGBA1C in the last 72 hours. Lab Results  Component Value Date   HGBA1C 6.1 (H) 06/25/2015   HGBA1C 6.2 (H) 11/04/2014   HGBA1C 6.6 (H) 12/09/2012     CBG: Recent Labs  Lab 10/06/17 0109  GLUCAP 92    Lipid Profile: No results for input(s): CHOL, HDL, LDLCALC, TRIG, CHOLHDL, LDLDIRECT in the last 72 hours.  Thyroid Function Tests: No results for input(s): TSH, T4TOTAL, FREET4, T3FREE, THYROIDAB in the last 72 hours.  Anemia Panel: No results for input(s): VITAMINB12, FOLATE, FERRITIN, TIBC, IRON, RETICCTPCT in the last 72 hours.  Urine analysis:    Component Value Date/Time   COLORURINE YELLOW 12/09/2012 1203   APPEARANCEUR CLOUDY (A) 12/09/2012 1203   LABSPEC 1.037 (H) 12/09/2012 1203   PHURINE 5.5 12/09/2012 1203   GLUCOSEU  NEGATIVE 12/09/2012 1203   HGBUR SMALL (A) 12/09/2012 1203   BILIRUBINUR NEGATIVE 12/09/2012 1203   KETONESUR NEGATIVE 12/09/2012 1203   PROTEINUR 30 (A) 12/09/2012 1203   UROBILINOGEN 1.0 12/09/2012 1203   NITRITE POSITIVE (A) 12/09/2012 1203   LEUKOCYTESUR MODERATE (A) 12/09/2012 1203    Sepsis Labs: @LABRCNTIP (procalcitonin:4,lacticidven:4) )No results found for this or any previous  visit (from the past 240 hour(s)).       Radiological Exams on Admission: Dg Chest 2 View  Result Date: 10/06/2017 CLINICAL DATA:  Shortness of breath EXAM: CHEST - 2 VIEW COMPARISON:  11/26/2015 FINDINGS: No pleural effusion. Cardiomegaly with mild vascular congestion. No focal airspace disease. No pneumothorax. Degenerative changes of the spine. IMPRESSION: Mild cardiomegaly with vascular congestion. Electronically Signed   By: Jasmine Pang M.D.   On: 10/06/2017 01:37   Ct Head Wo Contrast  Result Date: 10/06/2017 CLINICAL DATA:  Slurred speech, LEFT-sided weakness for 3 days. History of hypertension, hyperlipidemia. EXAM: CT HEAD WITHOUT CONTRAST TECHNIQUE: Contiguous axial images were obtained from the base of the skull through the vertex without intravenous contrast. COMPARISON:  None. FINDINGS: BRAIN: No intraparenchymal hemorrhage, mass effect nor midline shift. Age indeterminate LEFT basal ganglial lacunar infarct. Mild parenchymal brain volume loss. No hydrocephalus. Patchy to confluent supratentorial white matter hypodensities. Old small RIGHT cerebellar infarct. No acute large vascular territory infarcts. No abnormal extra-axial fluid collections. Basal cisterns are patent. VASCULAR: Moderate to severe calcific atherosclerosis of the carotid siphons. SKULL: No skull fracture. No significant scalp soft tissue swelling. SINUSES/ORBITS: Partially imaged small LEFT maxillary mucosal retention cyst. Mastoid air cells are well aerated.The included ocular globes and orbital contents are non-suspicious.  OTHER: None. IMPRESSION: 1. Age indeterminate LEFT basal ganglia lacunar infarct. 2. Moderate to severe chronic small vessel ischemic changes. Old small RIGHT cerebellar infarct. 3. Mild parenchymal brain volume loss for age. Electronically Signed   By: Awilda Metro M.D.   On: 10/06/2017 02:23   Dg Chest 2 View  Result Date: 10/06/2017 CLINICAL DATA:  Shortness of breath EXAM: CHEST - 2 VIEW COMPARISON:  11/26/2015 FINDINGS: No pleural effusion. Cardiomegaly with mild vascular congestion. No focal airspace disease. No pneumothorax. Degenerative changes of the spine. IMPRESSION: Mild cardiomegaly with vascular congestion. Electronically Signed   By: Jasmine Pang M.D.   On: 10/06/2017 01:37   Ct Head Wo Contrast  Result Date: 10/06/2017 CLINICAL DATA:  Slurred speech, LEFT-sided weakness for 3 days. History of hypertension, hyperlipidemia. EXAM: CT HEAD WITHOUT CONTRAST TECHNIQUE: Contiguous axial images were obtained from the base of the skull through the vertex without intravenous contrast. COMPARISON:  None. FINDINGS: BRAIN: No intraparenchymal hemorrhage, mass effect nor midline shift. Age indeterminate LEFT basal ganglial lacunar infarct. Mild parenchymal brain volume loss. No hydrocephalus. Patchy to confluent supratentorial white matter hypodensities. Old small RIGHT cerebellar infarct. No acute large vascular territory infarcts. No abnormal extra-axial fluid collections. Basal cisterns are patent. VASCULAR: Moderate to severe calcific atherosclerosis of the carotid siphons. SKULL: No skull fracture. No significant scalp soft tissue swelling. SINUSES/ORBITS: Partially imaged small LEFT maxillary mucosal retention cyst. Mastoid air cells are well aerated.The included ocular globes and orbital contents are non-suspicious. OTHER: None. IMPRESSION: 1. Age indeterminate LEFT basal ganglia lacunar infarct. 2. Moderate to severe chronic small vessel ischemic changes. Old small RIGHT cerebellar infarct.  3. Mild parenchymal brain volume loss for age. Electronically Signed   By: Awilda Metro M.D.   On: 10/06/2017 02:23      EKG: Independently reviewed. Sinus rhythm Normal ECG When compared with ECG of 09/02/2016, No significant change was found    Assessment/Plan Principal Problem: acute CVA,/small subcortical infarct - HgbA1c, fasting lipid panel - MRI  Shows Acute 1-1.5 cm right basal ganglia/radiating white matter tract infarction. No swelling or hemorrhage. 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 Admit patient to telemetry Will continue to cycle cardiac enzymes 2-D echo to rule out wall motion abnormalities Status post recent cardiac cath last year therefore will not consult cardiology unless troponins are abnormal      Hyperlipidemia Lipitor has been increased to 40 mg a day Check lipid panel    Hypertension Hold Diovan for permissive hypertension , treat if systolic blood pressure greater than 200 Continue metoprolol  prediabetes We'll check  Hemoglobin A1c           DVT prophylaxis: Lovenox  Code Status History  Full code     consults called: neurology  Family Communication: Admission, patients condition and plan of care including tests being ordered have been discussed with the patient  who indicates understanding and agree with the plan and Code Status   Admission status:  observation      Disposition plan: Further plan will depend as patient's clinical course evolves and further radiologic and laboratory data become available. Likely home when stable    Richarda Overlie MD [facility] Pager [phone]  If 7PM-7AM, please contact night-coverage [email or web address] Password [facility]  10/06/2017, 8:52 AM

## 2017-10-06 NOTE — Care Management (Signed)
This is a no charge note  Pending admission per Dr. Preston FleetingGlick  67 year old lady with a past medical history of hypertension, hyperlipidemia, overactive bladder, who presents with left-sided weakness, slurred speech since Sunday morning.  Last known normal Saturday.  CT head showed left basal ganglia lacunar stroke.  Neurology, Dr. Amada JupiterKirkpatrick was consulted. Pt will be placed on tele bed for obs    Lorretta HarpXilin Sheketa Ende, MD  [facility] Pager [phone]  If 7PM-7AM, please contact night-coverage [email or web address] Password [email or web address] 10/06/2017, 2:49 AM

## 2017-10-06 NOTE — ED Notes (Signed)
Patient transported to MRI 

## 2017-10-06 NOTE — ED Provider Notes (Signed)
[facility] EMERGENCY DEPARTMENT Provider Note   CSN: [id number] Arrival date & time: 10/06/17  0036     History   Chief Complaint Chief Complaint  Patient presents with  . Cerebrovascular Accident    HPI [name] [name] is a 67 y.o. female.  The history is provided by the patient.  She has a history of hypertension, diabetes, hyperlipidemia and comes in concerned that she may have had a stroke.  3 days ago, she noted that she was having trouble writing things and that her left leg was dragging and that her speech was slurred and she was seeing double.  She was last known to be normal 4 days ago.  She denies headache.  Symptoms have been stable over time.  She denies chest pain, heaviness, tightness, pressure.  She denies nausea or vomiting.  She is left-handed.  Also, over the last 24 hours, she has noted dyspnea when she lays flat.  Past Medical History:  Diagnosis Date  . Arthritis    "right knee" (07/02/2015)  . Borderline diabetes   . Hyperlipidemia   . Hypertension   . Overactive bladder   . S/P right knee arthroscopy    with septic joint growing out MSSA  . Septic arthritis of knee, right (HCC)   . Wears glasses     Patient Active Problem List   Diagnosis Date Noted  . Trigger thumb, right thumb 05/03/2016  . 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 Protein-calorie malnutrition (HCC) 11/15/2014  . Osteomyelitis of right knee region (HCC) 11/08/2014  . Complication of device 11/04/2014  . Septic arthritis (HCC) 11/04/2014  . S/P right knee arthroscopy   . UTI (urinary tract infection) 12/09/2012  . 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 Hyperlipidemia   . Hypertension     Past Surgical History:  Procedure Laterality Date  . COLONOSCOPY    . DIAGNOSTIC LAPAROSCOPY    . JOINT REPLACEMENT    . KNEE ARTHROSCOPY Right 12/09/2012   Procedure: ARTHROSCOPIC IRRIGATION AND DEBRIDEMENT RIGHT KNEE;  Surgeon: Budd Palmer, MD;   Location: [facility] OR;  Service: Orthopedics;  Laterality: Right;  . LEFT HEART CATH AND CORONARY ANGIOGRAPHY N/A 09/02/2016   Procedure: Left Heart Cath and Coronary Angiography;  Surgeon: Rinaldo Cloud, MD;  Location: [facility] INVASIVE CV LAB;  Service: Cardiovascular;  Laterality: N/A;  . 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/06/2014   Procedure: PLACEMENT OF CEMENT SPACER;  Surgeon: Loreta Ave, MD;  Location: [facility] OR;  Service: Orthopedics;  Laterality: Right;  . TOTAL KNEE ARTHROPLASTY Bilateral 2011-2012   left-right  . TOTAL KNEE REVISION Right 11/06/2014   Procedure: REMOVAL OF TOTAL COMPONENTS, EXTENSIVE  IRRIGATION AND DEBRIDEMENT  RIGHT KNEE;  Surgeon: Loreta Ave, MD;  Location: [facility] OR;  Service: Orthopedics;  Laterality: Right;  . TOTAL KNEE REVISION Right 07/02/2015  . TOTAL KNEE REVISION Right 07/02/2015   Procedure: TOTAL KNEE REVISION;  Surgeon: Loreta Ave, MD;  Location: [facility] OR;  Service: Orthopedics;  Laterality: Right;  . TUBAL LIGATION  ~ 1980     OB History   None      Home Medications    Prior to Admission medications   Medication Sig Start Date End Date Taking? Authorizing Provider  amLODipine (NORVASC) 5 MG tablet Take 5 mg by mouth daily.    [provider]  amoxicillin (AMOXIL) 500 MG capsule TAKE FOUR CAPSULES BY MOUTH ONE HOUR PRIOR TO DENTAL APPOINTMENT 05/30/17  Cristie HemStanbery, Mary L, PA-C  aspirin EC 81 MG tablet Take 81 mg by mouth daily.    [provider]  Aspirin-Salicylamide-Caffeine (BC FAST PAIN RELIEF) 650-195-33.3 MG PACK Take 1 packet by mouth every 8 (eight) hours as needed (for pain.).    [provider]  atorvastatin (LIPITOR) 10 MG tablet Take 10 mg by mouth daily. 06/14/16   [provider]  atorvastatin (LIPITOR) 40 MG tablet Take 1 tablet (40 mg total) by mouth daily at 6 PM. 08/18/16   Shade FloodGreene, Jeffrey R, MD  methocarbamol (ROBAXIN) 500 MG tablet Take 1 tablet (500 mg total) by mouth 2 (two) times daily  between meals as needed for muscle spasms. 09/12/17   Nadara Mustarduda, Marcus V, MD  metoprolol succinate (TOPROL-XL) 100 MG 24 hr tablet Take 100 mg by mouth daily. Take with or immediately following a meal.    [provider]  MYRBETRIQ 50 MG TB24 tablet Take 50 mg by mouth daily. 08/17/16   [provider]  nitroGLYCERIN (NITROSTAT) 0.4 MG SL tablet Place 0.4 mg under the tongue every 5 (five) minutes x 3 doses as needed. For chest pain. 07/30/16   [provider]  trimethoprim (TRIMPEX) 100 MG tablet Take 100 mg by mouth daily. 03/15/16   [provider]  valsartan (DIOVAN) 160 MG tablet Take 1 tablet (160 mg total) by mouth daily. 08/18/16   Shade FloodGreene, Jeffrey R, MD    Family History Family History  Problem Relation Age of Onset  . Cancer - Other Mother   . Hypertension Other     Social History Social History   Tobacco Use  . Smoking status: Former Smoker    Packs/day: 0.25    Years: 23.00    Pack years: 5.75    Types: Cigarettes    Last attempt to quit: 08/01/1992    Years since quitting: 25.1  . Smokeless tobacco: Never Used  Substance Use Topics  . Alcohol use: No  . Drug use: No     Allergies   Patient has no known allergies.   Review of Systems Review of Systems  All other systems reviewed and are negative.    Physical Exam Updated Vital Signs BP (!) 178/137 (BP Location: Right Wrist)   Pulse (!) 103   Temp 98 F (36.7 C) (Oral)   Resp 16   Ht 5\' 5"  (1.651 m)   Wt 136.1 kg   SpO2 99%   BMI 49.92 kg/m   Physical Exam  Nursing note and vitals reviewed.  67 year old female, resting comfortably and in no acute distress. Vital signs are significant for elevated blood pressure and heart rate. Oxygen saturation is 99%, which is normal. Head is normocephalic and atraumatic. PERRLA, EOMI. Oropharynx is clear. Neck is nontender and supple without adenopathy or JVD.  Bilateral carotid bruits are heard. Back is nontender and there is no CVA  tenderness. Lungs are clear without rales, wheezes, or rhonchi. Chest is nontender. Heart has regular rate and rhythm without murmur. Abdomen is soft, flat, nontender without masses or hepatosplenomegaly and peristalsis is normoactive. Extremities have 1+ edema, full range of motion is present. Skin is warm and dry without rash. Neurologic: Awake and alert and oriented.  Speech is mildly dysarthric.  Mild left central facial droop present.  No diplopia elicited with full range of extraocular movements.  Mild left pronator drift.  Mild weakness of left arm and left leg with strength 4/5.  Normal strength in right arm and right leg.  Sensation is normal.  No Babinski reflex seen.  ED Treatments / Results  Labs (all labs ordered are listed, but only abnormal results are displayed) Labs Reviewed  CBC - Abnormal; Notable for the following components:      Result Value   RBC 5.25 (*)    Hemoglobin 11.6 (*)    MCV 77.5 (*)    MCH 22.1 (*)    MCHC 28.5 (*)    RDW 18.4 (*)    All other components within normal limits  COMPREHENSIVE METABOLIC PANEL - Abnormal; Notable for the following components:   Glucose, Bld 100 (*)    All other components within normal limits  PROTIME-INR  APTT  DIFFERENTIAL  BRAIN NATRIURETIC PEPTIDE  I-STAT TROPONIN, ED  CBG MONITORING, ED  I-STAT CHEM 8, ED    EKG EKG Interpretation  Date/Time:  Thursday October 06 2017 00:56:31 EDT Ventricular Rate:  97 PR Interval:    QRS Duration: 107 QT Interval:  374 QTC Calculation: 476 R Axis:   70 Text Interpretation:  Sinus rhythm Normal ECG When compared with ECG of 09/02/2016, No significant change was found Confirmed by Dione Booze ([id number]) on 10/06/2017 12:59:36 AM   Radiology Dg Chest 2 View  Result Date: 10/06/2017 CLINICAL DATA:  Shortness of breath EXAM: CHEST - 2 VIEW COMPARISON:  11/26/2015 FINDINGS: No pleural effusion. Cardiomegaly with mild vascular congestion. No focal airspace disease. No  pneumothorax. Degenerative changes of the spine. IMPRESSION: Mild cardiomegaly with vascular congestion. Electronically Signed   By: Jasmine Pang M.D.   On: 10/06/2017 01:37   Ct Head Wo Contrast  Result Date: 10/06/2017 CLINICAL DATA:  Slurred speech, LEFT-sided weakness for 3 days. History of hypertension, hyperlipidemia. EXAM: CT HEAD WITHOUT CONTRAST TECHNIQUE: Contiguous axial images were obtained from the base of the skull through the vertex without intravenous contrast. COMPARISON:  None. FINDINGS: BRAIN: No intraparenchymal hemorrhage, mass effect nor midline shift. Age indeterminate LEFT basal ganglial lacunar infarct. Mild parenchymal brain volume loss. No hydrocephalus. Patchy to confluent supratentorial white matter hypodensities. Old small RIGHT cerebellar infarct. No acute large vascular territory infarcts. No abnormal extra-axial fluid collections. Basal cisterns are patent. VASCULAR: Moderate to severe calcific atherosclerosis of the carotid siphons. SKULL: No skull fracture. No significant scalp soft tissue swelling. SINUSES/ORBITS: Partially imaged small LEFT maxillary mucosal retention cyst. Mastoid air cells are well aerated.The included ocular globes and orbital contents are non-suspicious. OTHER: None. IMPRESSION: 1. Age indeterminate LEFT basal ganglia lacunar infarct. 2. Moderate to severe chronic small vessel ischemic changes. Old small RIGHT cerebellar infarct. 3. Mild parenchymal brain volume loss for age. Electronically Signed   By: Awilda Metro M.D.   On: 10/06/2017 02:23    Procedures Procedures  CRITICAL CARE Performed by: Dione Booze Total critical care time: 40 minutes Critical care time was exclusive of separately billable procedures and treating other patients. Critical care was necessary to treat or prevent imminent or life-threatening deterioration. Critical care was time spent personally by me on the following activities: development of treatment plan with  patient and/or surrogate as well as nursing, discussions with consultants, evaluation of patient's response to treatment, examination of patient, obtaining history from patient or surrogate, ordering and performing treatments and interventions, ordering and review of laboratory studies, ordering and review of radiographic studies, pulse oximetry and re-evaluation of patient's condition.  Medications Ordered in ED Medications  labetalol (NORMODYNE,TRANDATE) injection 20 mg (20 mg Intravenous Given 10/06/17 0250)     Initial Impression / Assessment and Plan /  ED Course  I have reviewed the triage vital signs and the nursing notes.  Pertinent labs & imaging results that were available during my care of the patient were reviewed by me and considered in my medical decision making (see chart for details).  Right hemisphere stroke.  Patient is well outside the timeframe where thrombolytics or endovascular procedures would be beneficial.  She is well outside the window first code stroke activation.  With complaints of dyspnea, will evaluate for possible heart failure.  She is being sent for CT of the head.  Poorly controlled blood pressure.  In setting of acute stroke, will need to bring blood pressure down carefully.  She will be given IV labetalol.  She will need to be admitted for stroke work-up and to initiate physical therapy.  Old records are reviewed, and she has no relevant past visits.  Cardiac catheterization 1 year ago showed nonobstructive coronary artery disease.  CT does not show definite signs of recent stroke.  Chest x-ray is consistent with heart failure, but BNP is normal.  Blood pressure has come down with IV labetalol.  Case is discussed with Dr. Clyde Lundborg of [facility], who agrees to admit the patient.  Case also discussed with Dr. Amada Jupiter of neurology service who agrees to see the patient in consultation.  Final Clinical Impressions(s) / ED Diagnoses   Final diagnoses:    Cerebrovascular accident (CVA), unspecified mechanism (HCC)  Hypertensive urgency  Microcytic anemia    ED Discharge Orders    None       Dione Booze, MD 10/06/17 [id number]

## 2017-10-07 ENCOUNTER — Inpatient Hospital Stay (HOSPITAL_COMMUNITY): Payer: 59

## 2017-10-07 ENCOUNTER — Other Ambulatory Visit: Payer: Self-pay

## 2017-10-07 DIAGNOSIS — I6300 Cerebral infarction due to thrombosis of unspecified precerebral artery: Secondary | ICD-10-CM

## 2017-10-07 DIAGNOSIS — I160 Hypertensive urgency: Secondary | ICD-10-CM

## 2017-10-07 LAB — LIPID PANEL
Cholesterol: 174 mg/dL (ref 0–200)
HDL: 36 mg/dL — ABNORMAL LOW (ref >40)
LDL Cholesterol: 117 mg/dL — ABNORMAL HIGH (ref 0–99)
Total CHOL/HDL Ratio: 4.8 RATIO
Triglycerides: 103 mg/dL (ref <150)
VLDL: 21 mg/dL (ref 0–40)

## 2017-10-07 LAB — URINALYSIS, ROUTINE W REFLEX MICROSCOPIC
Bacteria, UA: NONE SEEN
Bilirubin Urine: NEGATIVE
GLUCOSE, UA: NEGATIVE mg/dL
HGB URINE DIPSTICK: NEGATIVE
Ketones, ur: NEGATIVE mg/dL
LEUKOCYTES UA: NEGATIVE
NITRITE: NEGATIVE
PH: 5 (ref 5.0–8.0)
PROTEIN: 30 mg/dL — AB
SPECIFIC GRAVITY, URINE: 1.026 (ref 1.005–1.030)

## 2017-10-07 LAB — RAPID URINE DRUG SCREEN, HOSP PERFORMED
Amphetamines: NOT DETECTED
Barbiturates: NOT DETECTED
Benzodiazepines: NOT DETECTED
COCAINE: NOT DETECTED
OPIATES: NOT DETECTED
TETRAHYDROCANNABINOL: NOT DETECTED

## 2017-10-07 LAB — HEMOGLOBIN A1C
HEMOGLOBIN A1C: 6.2 % — AB (ref 4.8–5.6)
MEAN PLASMA GLUCOSE: 131.24 mg/dL

## 2017-10-07 MED ORDER — ACETAMINOPHEN 325 MG PO TABS
650.0000 mg | ORAL_TABLET | Freq: Four times a day (QID) | ORAL | Status: DC | PRN
  Administered 2017-10-07 – 2017-10-11 (×7): 650 mg via ORAL
  Filled 2017-10-07 (×7): qty 2

## 2017-10-07 MED ORDER — TRIMETHOPRIM 100 MG PO TABS
100.0000 mg | ORAL_TABLET | Freq: Every day | ORAL | Status: DC
  Administered 2017-10-07 – 2017-10-12 (×6): 100 mg via ORAL
  Filled 2017-10-07 (×6): qty 1

## 2017-10-07 MED ORDER — CLOPIDOGREL BISULFATE 75 MG PO TABS
75.0000 mg | ORAL_TABLET | Freq: Every day | ORAL | Status: DC
  Administered 2017-10-07 – 2017-10-12 (×6): 75 mg via ORAL
  Filled 2017-10-07 (×6): qty 1

## 2017-10-07 NOTE — Progress Notes (Signed)
PROGRESS NOTE  Tyonna Talerico ZHY:[id number] DOB: May 06, 1950 DOA: 10/06/2017 PCP: Shade Flood, MD  HPI/Recap of past 24 hours:  C/o diffuse ab pain early this am, kub, ua unremarkable, she is comfortable this afternoon  C/o left shoulder pain, requiring shoulder steroids injection\  C/o headache later this evening  She c/o Chest pain yesterday, she denies chest pain today  Daughter at bedside  Assessment/Plan: Principal Problem:   Stroke [facility]) Active Problems:   Hyperlipidemia   Hypertension   Acute CVA (cerebrovascular accident) (HCC)   Acute CVA:  right BG infarct secondary to small vessel disease   -With multiple vascular risk factors of morbid obesity, diabetes, hypertension, hyperlipidemia -she presented with slurred speech, left arm weakness -mri brain (Acute 1-1.5 cm right basal ganglia/radiating white matter tract infarction. No swelling or hemorrhage.) -2D Echo: mild left venrtical concentric hypertrophy. lvef wnl , no WMA, no embolic source -LDL 114mg  , on lipitor 40mg  daily ( was on 10mg  daily prior to hospitalization) -HgbA1c 6.2  -neurology input appreciated "Recommend dual antiplatelet therapy for 3 weeks followed by aspirin alone" -PT/OT recommended SNF placement, social worker consulted  Chest pain:  Denies chest pain today Troponin negative ekg sinus rhythm, no acute st/t changes Last cardiac cath in 2018 by Dr Sharyn Lull She is to follow up with Dr Sharyn Lull   HTN:  Allow permissive hypertension, gradually normalize bp   Morbid obesity: Body mass index is 49.92 kg/m. Will benefit outpatient sleep study  Left shoulder pain -she is to follow up with orthopedic   Code Status: full  Family Communication: patient and daughter  Disposition Plan: snf when bed available   Consultants:  neurology  Procedures:  none  Antibiotics:  none   Objective: BP (!) 161/73 (BP Location: Left Wrist)   Pulse 88   Temp 97.8 F (36.6 C)  (Oral)   Resp 20   Ht 5\' 5"  (1.651 m)   Wt 136.1 kg   SpO2 96%   BMI 49.92 kg/m   Intake/Output Summary (Last 24 hours) at 10/07/2017 1016 Last data filed at 10/07/2017 0953 Gross per 24 hour  Intake 770 ml  Output 850 ml  Net -80 ml   Filed Weights   10/06/17 0049  Weight: 136.1 kg    Exam: Patient is examined daily including today on 10/07/2017, exams remain the same as of yesterday except that has changed    General:  NAD, obese  Cardiovascular: RRR  Respiratory: CTABL  Abdomen: Soft/ND/NT, positive BS  Musculoskeletal: No Edema  Neuro: alert, oriented   Data Reviewed: Basic Metabolic Panel: Recent Labs  Lab 10/06/17 0042 10/06/17 0119  NA 143 143  K 4.4 4.3  CL 109 110  CO2 24  --   GLUCOSE 100* 99  BUN 13 13  CREATININE 0.91 0.80  CALCIUM 9.9  --    Liver Function Tests: Recent Labs  Lab 10/06/17 0042  AST 23  ALT 18  ALKPHOS 95  BILITOT 0.4  PROT 7.3  ALBUMIN 3.8   No results for input(s): LIPASE, AMYLASE in the last 168 hours. No results for input(s): AMMONIA in the last 168 hours. CBC: Recent Labs  Lab 10/06/17 0042 10/06/17 0119  WBC 7.2  --   NEUTROABS 4.0  --   HGB 11.6* 13.6  HCT 40.7 40.0  MCV 77.5*  --   PLT 278  --    Cardiac Enzymes:   Recent Labs  Lab 10/06/17 1240 10/06/17 1910  TROPONINI <0.03 <0.03  BNP (last 3 results) Recent Labs    10/06/17 0055  BNP 15.3    ProBNP (last 3 results) No results for input(s): PROBNP in the last 8760 hours.  CBG: Recent Labs  Lab 10/06/17 0109  GLUCAP 92    No results found for this or any previous visit (from the past 240 hour(s)).   Studies: Mr Brain Wo Contrast  Result Date: 10/06/2017 CLINICAL DATA:  Left-sided weakness beginning 4 days ago. Speech disturbance. EXAM: MRI HEAD WITHOUT CONTRAST MRA HEAD WITHOUT CONTRAST TECHNIQUE: Multiplanar, multiecho pulse sequences of the brain and surrounding structures were obtained without intravenous contrast.  Angiographic images of the head were obtained using MRA technique without contrast. COMPARISON:  CT same day FINDINGS: MRI HEAD FINDINGS Brain: 1-1.5 cm acute infarction in the right basal ganglia and radiating white matter tracts. No other acute or subacute infarction. Elsewhere, there are old small vessel infarctions within the inferior right cerebellum, thalami and cerebral hemispheric deep and subcortical white matter. No large vessel territory infarction. No mass lesion, hemorrhage hydrocephalus or extra-axial collection. Vascular: Major vessels at the base of the brain show flow. Skull and upper cervical spine: Negative Sinuses/Orbits: Clear/normal Other: None MRA HEAD FINDINGS Both internal carotid arteries are patent through the skull base and siphon regions. No siphon stenosis. The anterior and middle cerebral vessels are patent without proximal stenosis, aneurysm or vascular malformation. Both vertebral arteries are patent with the left being dominant. There is atherosclerotic narrowing of the V4 segment of the right vertebral artery, but the vessel does remain patent to the basilar. Both posterior inferior cerebellar arteries show flow. The basilar artery shows mild atherosclerotic irregularity but no stenosis. Superior cerebellar and posterior cerebral arteries show flow. IMPRESSION: Acute 1-1.5 cm right basal ganglia/radiating white matter tract infarction. No swelling or hemorrhage. Chronic small-vessel ischemic changes elsewhere throughout the brain as outlined above. No anterior circulation intracranial large or medium vessel abnormality. Atherosclerotic narrowing of the V4 segment of the right vertebral artery which is non dominant. No other posterior circulation abnormal finding. Electronically Signed   By: Paulina FusiMark  Shogry M.D.   On: 10/06/2017 11:14   Mr Maxine GlennMra Head Wo Contrast  Result Date: 10/06/2017 CLINICAL DATA:  Left-sided weakness beginning 4 days ago. Speech disturbance. EXAM: MRI HEAD  WITHOUT CONTRAST MRA HEAD WITHOUT CONTRAST TECHNIQUE: Multiplanar, multiecho pulse sequences of the brain and surrounding structures were obtained without intravenous contrast. Angiographic images of the head were obtained using MRA technique without contrast. COMPARISON:  CT same day FINDINGS: MRI HEAD FINDINGS Brain: 1-1.5 cm acute infarction in the right basal ganglia and radiating white matter tracts. No other acute or subacute infarction. Elsewhere, there are old small vessel infarctions within the inferior right cerebellum, thalami and cerebral hemispheric deep and subcortical white matter. No large vessel territory infarction. No mass lesion, hemorrhage hydrocephalus or extra-axial collection. Vascular: Major vessels at the base of the brain show flow. Skull and upper cervical spine: Negative Sinuses/Orbits: Clear/normal Other: None MRA HEAD FINDINGS Both internal carotid arteries are patent through the skull base and siphon regions. No siphon stenosis. The anterior and middle cerebral vessels are patent without proximal stenosis, aneurysm or vascular malformation. Both vertebral arteries are patent with the left being dominant. There is atherosclerotic narrowing of the V4 segment of the right vertebral artery, but the vessel does remain patent to the basilar. Both posterior inferior cerebellar arteries show flow. The basilar artery shows mild atherosclerotic irregularity but no stenosis. Superior cerebellar and posterior cerebral arteries show  flow. IMPRESSION: Acute 1-1.5 cm right basal ganglia/radiating white matter tract infarction. No swelling or hemorrhage. Chronic small-vessel ischemic changes elsewhere throughout the brain as outlined above. No anterior circulation intracranial large or medium vessel abnormality. Atherosclerotic narrowing of the V4 segment of the right vertebral artery which is non dominant. No other posterior circulation abnormal finding. Electronically Signed   By: Paulina Fusi M.D.    On: 10/06/2017 11:14    Scheduled Meds: . aspirin  300 mg Rectal Daily   Or  . aspirin EC  325 mg Oral Daily  . atorvastatin  40 mg Oral q1800  . enoxaparin (LOVENOX) injection  40 mg Subcutaneous Q24H  . metoprolol succinate  100 mg Oral Daily  . mirabegron ER  50 mg Oral Daily    Continuous Infusions:   Time spent: 25 mins I have personally reviewed and interpreted on  10/07/2017 daily labs, tele strips, imagings as discussed above under date review session and assessment and plans.  I reviewed all nursing notes, pharmacy notes, consultant notes,  vitals, pertinent old records  I have discussed plan of care as described above with RN , patient and family on 10/07/2017   Albertine Grates MD, PhD  [facility] Pager [phone]. If 7PM-7AM, please contact night-coverage at [email or web address], password [facility] 10/07/2017, 10:16 AM  LOS: 1 day

## 2017-10-07 NOTE — Clinical Social Work Note (Signed)
Clinical Social Work Assessment  Patient Details  Name: [name] HarborMyrtle Rolf MRN: [id number] Date of Birth: 05-26-1950  Date of referral:  10/07/17               Reason for consult:  Facility Placement                Permission sought to share information with:  Facility Medical sales representativeContact Representative, Family Supports Permission granted to share information::  Yes, Verbal Permission Granted  Name::     StatisticianMargaret  Agency::  SNF  Relationship::  Daughter  Contact Information:     Housing/Transportation Living arrangements for the past 2 months:  Single Family Home Source of Information:  Patient Patient Interpreter Needed:  None Criminal Activity/Legal Involvement Pertinent to Current Situation/Hospitalization:  No - Comment as needed Significant Relationships:  Adult Children Lives with:  Self, Adult Children Do you feel safe going back to the place where you live?  Yes Need for family participation in patient care:  No (Coment)  Care giving concerns:  Patient from home with family, but will need short term rehab at discharge.   Social Worker assessment / plan:  CSW spoke with patient and daughter at bedside to discuss recommendation for SNF placement. CSW provided facility list for patient and daughter to review. CSW to fax out referral and follow.  Employment status:  Retired Database administratornsurance information:  Managed Medicare PT Recommendations:  Skilled Nursing Facility Information / Referral to community resources:  Skilled Nursing Facility  Patient/Family's Response to care:  Patient agreeable to SNF placement but only for specific facilities.   Patient/Family's Understanding of and Emotional Response to Diagnosis, Current Treatment, and Prognosis:  Patient discussed how SNF had been explained to her, but that she's been to some of them in the area that she would never want to go back to again. Patient listed off facilities that were not ok, and she [facility].  Patient and daughter are reviewing list to see which facilities would be OK.  Emotional Assessment Appearance:  Appears stated age Attitude/Demeanor/Rapport:  Engaged Affect (typically observed):  Pleasant Orientation:  Oriented to Self, Oriented to Place, Oriented to  Time, Oriented to Situation Alcohol / Substance use:  Not Applicable Psych involvement (Current and /or in the community):  No (Comment)  Discharge Needs  Concerns to be addressed:  Care Coordination Readmission within the last 30 days:  No Current discharge risk:  Physical Impairment, Dependent with Mobility Barriers to Discharge:  Continued Medical Work up, Insurance Authorization   Baldemar Lenislizabeth M Alainna Stawicki, [place] 10/07/2017, 4:24 PM

## 2017-10-07 NOTE — Evaluation (Signed)
Occupational Therapy Evaluation Patient Details Name: [name] [name] MRN: [id number] DOB: [birth year] Today's Date: 10/07/2017    History of Present Illness 67 yo female with onset of R basal ganglia stroke and L LE hemiparesis was admitted, noted chest tightness and SOB initially as well as EF 49%.  PMHx:  atherosclerosis, CAD, borderline DM, R knee sepsis, HTN8 yo female with onset of R basal ganglia stroke and L LE hemiparesis was admitted, noted chest tightness and SOB initially as well as EF 49%.  PMHx:  atherosclerosis, CAD, borderline DM, R knee sepsis, HTN   Clinical Impression   PTA Pt independent for grooming, bathing and feeding in seated position. Pt mod A for dressing. Pt is currently max A for LB ADL, requires assist for transfers, decreased activity tolerance. Pt will require SNF level therapy and OT will follow acutely. Next session focus on AE education as well as energy conservation.     Follow Up Recommendations  SNF;Supervision/Assistance - 24 hour    Equipment Recommendations  Other (comment)(defer to next venue of care)    Recommendations for Other Services       Precautions / Restrictions Precautions Precautions: Fall Precaution Comments: home with daughter at all times Restrictions Weight Bearing Restrictions: No      Mobility Bed Mobility Overal bed mobility: Needs Assistance Bed Mobility: Supine to Sit     Supine to sit: Min guard     General bed mobility comments: pt uses elevated HOB and bedrail to sit up side of bed  Transfers Overall transfer level: Needs assistance Equipment used: Rolling walker (2 wheeled);1 person hand held assist Transfers: Sit to/from Stand Sit to Stand: Min assist         General transfer comment: OT had to hold RW steady    Balance Overall balance assessment: History of Falls;Needs assistance Sitting-balance support: Feet supported Sitting balance-Leahy Scale: Fair     Standing balance support: Bilateral  upper extremity supported;During functional activity Standing balance-Leahy Scale: Poor Standing balance comment: dependent on RW in standing positino                           ADL either performed or assessed with clinical judgement   ADL Overall ADL's : Needs assistance/impaired Eating/Feeding: Modified independent   Grooming: Set up;Wash/dry hands;Wash/dry face;Sitting Grooming Details (indicate cue type and reason): in recliner Upper Body Bathing: Moderate assistance   Lower Body Bathing: Maximal assistance   Upper Body Dressing : Minimal assistance   Lower Body Dressing: Maximal assistance Lower Body Dressing Details (indicate cue type and reason): Pt unable to bring feet cross to knees Toilet Transfer: Minimal assistance;Ambulation;RW   Toileting- Clothing Manipulation and Hygiene: Maximal assistance;Sit to/from stand Toileting - Clothing Manipulation Details (indicate cue type and reason): requires BUE support for standing balance     Functional mobility during ADLs: Minimal assistance;Cueing for sequencing;Rolling walker       Vision Baseline Vision/History: Wears glasses Wears Glasses: At all times Patient Visual Report: Blurring of vision(reports its from medication) Vision Assessment?: Yes Eye Alignment: Within Functional Limits Ocular Range of Motion: Within Functional Limits Alignment/Gaze Preference: Within Defined Limits Tracking/Visual Pursuits: Able to track stimulus in all quads without difficulty Visual Fields: Impaired-to be further tested in functional context;Left visual field deficit(peripheral not testing [facility]) Additional Comments: Pt consistently testing with delayed recognition in peripheral visual field on the left     Perception     Praxis      Pertinent  Vitals/Pain Pain Assessment: Faces Faces Pain Scale: Hurts little more Pain Location: abdomen Pain Descriptors / Indicators: Discomfort;Cramping Pain Intervention(s): Monitored  during session;Repositioned;Patient requesting pain meds-RN notified     Hand Dominance Left   Extremity/Trunk Assessment Upper Extremity Assessment Upper Extremity Assessment: LUE deficits/detail LUE Deficits / Details: grasp grossly 4/5, able to reach behind head, FF limited to approx 90 degrees, Shoulder painful LUE Sensation: WNL LUE Coordination: decreased gross motor   Lower Extremity Assessment Lower Extremity Assessment: Defer to PT evaluation   Cervical / Trunk Assessment Cervical / Trunk Assessment: Normal   Communication Communication Communication: No difficulties   Cognition Arousal/Alertness: Awake/alert Behavior During Therapy: WFL for tasks assessed/performed Overall Cognitive Status: Within Functional Limits for tasks assessed                                     General Comments  Pt's daughter present throughout session; Pt able to ambulate out to hallway and ride in recliner back to room    Exercises     Shoulder Instructions      Home Living Family/patient expects to be discharged to:: Private residence Living Arrangements: Children Available Help at Discharge: Family;Available 24 hours/day Type of Home: House Home Access: Level entry     Home Layout: One level     Bathroom Shower/Tub: Chief Strategy Officer: Standard     Home Equipment: Environmental consultant - 4 wheels;Cane - quad   Additional Comments: pt reports her cane is her usual means of walking      Prior Functioning/Environment Level of Independence: Independent with assistive device(s)        Comments: has been able to walk and perform ADL's alone at home.        OT Problem List: Decreased strength;Decreased activity tolerance;Impaired balance (sitting and/or standing);Decreased safety awareness;Decreased knowledge of use of DME or AE;Obesity      OT Treatment/Interventions: Self-care/ADL training;Therapeutic exercise;Energy conservation;DME and/or AE  instruction;Therapeutic activities;Patient/family education;Balance training    OT Goals(Current goals can be found in the care plan section) Acute Rehab OT Goals Patient Stated Goal: "be safe when I'm at home" OT Goal Formulation: With patient Time For Goal Achievement: 10/21/17 Potential to Achieve Goals: Good ADL Goals Pt Will Perform Lower Body Bathing: with supervision;with adaptive equipment;sitting/lateral leans Pt Will Perform Lower Body Dressing: with min guard assist;with caregiver independent in assisting;with adaptive equipment;sit to/from stand Pt Will Transfer to Toilet: with supervision;ambulating Pt Will Perform Toileting - Clothing Manipulation and hygiene: with min guard assist;with adaptive equipment;sit to/from stand Additional ADL Goal #1: Pt will recall 3 ways of conserving energy during ADL at independent level  OT Frequency: Min 2X/week   Barriers to D/C:            Co-evaluation              AM-PAC PT "6 Clicks" Daily Activity     Outcome Measure Help from another person eating meals?: None Help from another person taking care of personal grooming?: A Little Help from another person toileting, which includes using toliet, bedpan, or urinal?: A Lot Help from another person bathing (including washing, rinsing, drying)?: A Lot Help from another person to put on and taking off regular upper body clothing?: A Little Help from another person to put on and taking off regular lower body clothing?: A Lot 6 Click Score: 16   End of Session Equipment Utilized During  Treatment: Gait belt;Rolling walker Nurse Communication: Mobility status  Activity Tolerance: Patient tolerated treatment well Patient left: in chair;with call bell/phone within reach;with family/visitor present;with chair alarm set  OT Visit Diagnosis: Unsteadiness on feet (R26.81);Other abnormalities of gait and mobility (R26.89);Muscle weakness (generalized) (M62.81);History of falling (Z91.81)                 Time: 1000-1027 OT Time Calculation (min): 27 min Charges:  OT General Charges $OT Visit: 1 Visit OT Evaluation $OT Eval Moderate Complexity: 1 Mod OT Treatments $Self Care/Home Management : 8-22 mins  Sherryl MangesLaura Ari Bernabei OTR/L [phone]  Evern BioLaura J Francesca Strome 10/07/2017, 6:11 PM

## 2017-10-07 NOTE — Progress Notes (Signed)
STROKE TEAM PROGRESS NOTE     INTERVAL HISTORY Her daughter  is at the bedside.   She feels she is stronger and doing better today.  Vitals:   10/06/17 1936 10/07/17 0603 10/07/17 0745 10/07/17 1113  BP: (!) 160/82 (!) 161/70 (!) 161/73 (!) 168/83  Pulse: 87 79 88 78  Resp: 20 18 20 20   Temp: 97.8 F (36.6 C) 97.7 F (36.5 C) 97.8 F (36.6 C) 98.1 F (36.7 C)  TempSrc: Oral Oral Oral Oral  SpO2: 98% 95% 96% 98%  Weight:      Height:        CBC:  Recent Labs  Lab 10/06/17 0042 10/06/17 0119  WBC 7.2  --   NEUTROABS 4.0  --   HGB 11.6* 13.6  HCT 40.7 40.0  MCV 77.5*  --   PLT 278  --     Basic Metabolic Panel:  Recent Labs  Lab 10/06/17 0042 10/06/17 0119  NA 143 143  K 4.4 4.3  CL 109 110  CO2 24  --   GLUCOSE 100* 99  BUN 13 13  CREATININE 0.91 0.80  CALCIUM 9.9  --    Lipid Panel:     Component Value Date/Time   CHOL 174 10/07/2017 0403   TRIG 103 10/07/2017 0403   HDL 36 (L) 10/07/2017 0403   CHOLHDL 4.8 10/07/2017 0403   VLDL 21 10/07/2017 0403   LDLCALC 117 (H) 10/07/2017 0403   HgbA1c:  Lab Results  Component Value Date   HGBA1C 6.2 (H) 10/07/2017   Urine Drug Screen: No results found for: LABOPIA, COCAINSCRNUR, LABBENZ, AMPHETMU, THCU, LABBARB  Alcohol Level No results found for: [facility]  IMAGING Dg Chest 2 View  Result Date: 10/06/2017 CLINICAL DATA:  Shortness of breath EXAM: CHEST - 2 VIEW COMPARISON:  11/26/2015 FINDINGS: No pleural effusion. Cardiomegaly with mild vascular congestion. No focal airspace disease. No pneumothorax. Degenerative changes of the spine. IMPRESSION: Mild cardiomegaly with vascular congestion. Electronically Signed   By: Jasmine Pang M.D.   On: 10/06/2017 01:37   Ct Head Wo Contrast  Result Date: 10/06/2017 CLINICAL DATA:  Slurred speech, LEFT-sided weakness for 3 days. History of hypertension, hyperlipidemia. EXAM: CT HEAD WITHOUT CONTRAST TECHNIQUE: Contiguous axial images were obtained from the base of the  skull through the vertex without intravenous contrast. COMPARISON:  None. FINDINGS: BRAIN: No intraparenchymal hemorrhage, mass effect nor midline shift. Age indeterminate LEFT basal ganglial lacunar infarct. Mild parenchymal brain volume loss. No hydrocephalus. Patchy to confluent supratentorial white matter hypodensities. Old small RIGHT cerebellar infarct. No acute large vascular territory infarcts. No abnormal extra-axial fluid collections. Basal cisterns are patent. VASCULAR: Moderate to severe calcific atherosclerosis of the carotid siphons. SKULL: No skull fracture. No significant scalp soft tissue swelling. SINUSES/ORBITS: Partially imaged small LEFT maxillary mucosal retention cyst. Mastoid air cells are well aerated.The included ocular globes and orbital contents are non-suspicious. OTHER: None. IMPRESSION: 1. Age indeterminate LEFT basal ganglia lacunar infarct. 2. Moderate to severe chronic small vessel ischemic changes. Old small RIGHT cerebellar infarct. 3. Mild parenchymal brain volume loss for age. Electronically Signed   By: Awilda Metro M.D.   On: 10/06/2017 02:23   Mr Brain Wo Contrast  Result Date: 10/06/2017 CLINICAL DATA:  Left-sided weakness beginning 4 days ago. Speech disturbance. EXAM: MRI HEAD WITHOUT CONTRAST MRA HEAD WITHOUT CONTRAST TECHNIQUE: Multiplanar, multiecho pulse sequences of the brain and surrounding structures were obtained without intravenous contrast. Angiographic images of the head were obtained using MRA technique  without contrast. COMPARISON:  CT same day FINDINGS: MRI HEAD FINDINGS Brain: 1-1.5 cm acute infarction in the right basal ganglia and radiating white matter tracts. No other acute or subacute infarction. Elsewhere, there are old small vessel infarctions within the inferior right cerebellum, thalami and cerebral hemispheric deep and subcortical white matter. No large vessel territory infarction. No mass lesion, hemorrhage hydrocephalus or extra-axial  collection. Vascular: Major vessels at the base of the brain show flow. Skull and upper cervical spine: Negative Sinuses/Orbits: Clear/normal Other: None MRA HEAD FINDINGS Both internal carotid arteries are patent through the skull base and siphon regions. No siphon stenosis. The anterior and middle cerebral vessels are patent without proximal stenosis, aneurysm or vascular malformation. Both vertebral arteries are patent with the left being dominant. There is atherosclerotic narrowing of the V4 segment of the right vertebral artery, but the vessel does remain patent to the basilar. Both posterior inferior cerebellar arteries show flow. The basilar artery shows mild atherosclerotic irregularity but no stenosis. Superior cerebellar and posterior cerebral arteries show flow. IMPRESSION: Acute 1-1.5 cm right basal ganglia/radiating white matter tract infarction. No swelling or hemorrhage. Chronic small-vessel ischemic changes elsewhere throughout the brain as outlined above. No anterior circulation intracranial large or medium vessel abnormality. Atherosclerotic narrowing of the V4 segment of the right vertebral artery which is non dominant. No other posterior circulation abnormal finding. Electronically Signed   By: Paulina Fusi M.D.   On: 10/06/2017 11:14   Dg Abd Portable 1v  Result Date: 10/07/2017 CLINICAL DATA:  Abdominal pain EXAM: PORTABLE ABDOMEN - 1 VIEW COMPARISON:  Portable exam 1129 hours compared to CT abdomen and pelvis of 02/27/2016 FINDINGS: Normal bowel gas pattern. No bowel dilatation or bowel wall thickening. Osseous structures unremarkable. No urinary tract calcification. IMPRESSION: No acute abnormalities. Electronically Signed   By: Ulyses Southward M.D.   On: 10/07/2017 11:38   Mr Maxine Glenn Head Wo Contrast  Result Date: 10/06/2017 CLINICAL DATA:  Left-sided weakness beginning 4 days ago. Speech disturbance. EXAM: MRI HEAD WITHOUT CONTRAST MRA HEAD WITHOUT CONTRAST TECHNIQUE: Multiplanar, multiecho  pulse sequences of the brain and surrounding structures were obtained without intravenous contrast. Angiographic images of the head were obtained using MRA technique without contrast. COMPARISON:  CT same day FINDINGS: MRI HEAD FINDINGS Brain: 1-1.5 cm acute infarction in the right basal ganglia and radiating white matter tracts. No other acute or subacute infarction. Elsewhere, there are old small vessel infarctions within the inferior right cerebellum, thalami and cerebral hemispheric deep and subcortical white matter. No large vessel territory infarction. No mass lesion, hemorrhage hydrocephalus or extra-axial collection. Vascular: Major vessels at the base of the brain show flow. Skull and upper cervical spine: Negative Sinuses/Orbits: Clear/normal Other: None MRA HEAD FINDINGS Both internal carotid arteries are patent through the skull base and siphon regions. No siphon stenosis. The anterior and middle cerebral vessels are patent without proximal stenosis, aneurysm or vascular malformation. Both vertebral arteries are patent with the left being dominant. There is atherosclerotic narrowing of the V4 segment of the right vertebral artery, but the vessel does remain patent to the basilar. Both posterior inferior cerebellar arteries show flow. The basilar artery shows mild atherosclerotic irregularity but no stenosis. Superior cerebellar and posterior cerebral arteries show flow. IMPRESSION: Acute 1-1.5 cm right basal ganglia/radiating white matter tract infarction. No swelling or hemorrhage. Chronic small-vessel ischemic changes elsewhere throughout the brain as outlined above. No anterior circulation intracranial large or medium vessel abnormality. Atherosclerotic narrowing of the V4 segment of the  right vertebral artery which is non dominant. No other posterior circulation abnormal finding. Electronically Signed   By: Paulina Fusi M.D.   On: 10/06/2017 11:14    PHYSICAL EXAM  morbidly obese middle-aged  African-American lady not in distress. . Afebrile. Head is nontraumatic. Neck is supple without bruit.    Cardiac exam no murmur or gallop. Lungs are clear to auscultation. Distal pulses are well felt. Neurological Exam :  Awake alert oriented to time place and person. Mild dysarthria. No aphasia. Extraocular moments are full range without nystagmus. Blinks to threat bilaterally. Fundi were not visualized. Vision acuity seems adequate. Mild left lower facial weakness. Tongue midline. Left hemiplegia with 4/5 left upper extremity strength with significant weakness of grip and intrinsic hand muscles. 4/5 left lower extremity strength with weakness of hip flexors and ankle dorsiflexors. Sensation is diminished on the left compared to the right. Coordination is impaired on the left. Deep tendon reflexes are depressed on the left and normal on the right. Left plantar upgoing right downgoing. Gait not tested.  ASSESSMENT/PLAN [name] is a 67 y.o. female with history of HTN, HLD, borderline DB, presenting with L HP that started on Sunday, developed CP.   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  Moderate to severe small vessel disease.  Old right cerebellar infarct.  Atrophy.  MRI acute right basal ganglia white matter infarct.  Chronic small vessel disease.  MRA narrowing R VA V4  Carotid Doppler  pending  2D Echo  Left ventricle: The cavity size was normal. There was mild   concentric hypertrophy. Systolic function was normal. The   estimated ejection fraction was in the range of 55% to 60%. Wall   motion was normal; there were no regional wall motion    abnormalities.  LDL 114mg    HgbA1c 6.2   Lovenox 40 mg sq daily for VTE prophylaxis Diet Order            Diet Heart Room service appropriate? Yes; Fluid  consistency: Thin  Diet effective now              aspirin 81 mg daily prior to admission, now on No antithrombotic. Added aspirin 300 supp or 325 po.  Therapy recommendations:  snf   Disposition:  snf  (Lived w/ dtr PTA)  Hypertension  Stable . Permissive hypertension (OK if < 220/120) but gradually normalize in 5-7 days . Long-term BP goal normotensive  Hyperlipidemia  Home meds:  lipitor 40, resumed in hospital  LDL 114 , goal < 70  Continue statin at discharge  Pre-Diabetes type II  HgbA1c 6.2 goal < 7.0  Other Stroke Risk Factors  Advanced age  Former Cigarette smoker, quit 25 yrs ago  Morbid Obesity, Body mass index is 49.92 kg/m., recommend weight loss, diet and exercise as appropriate   Needs OP eval for OSA  Other Active Problems  Atypical CP  Hospital day # 1    .She presented several days after onset of left hemiparesis due to right basal ganglia infarct from small vessel disease but she has multiple risk factors. Recommend dual antiplatelet therapy for 3 weeks followed by aspirin alone.Check carotid Dopplers. Aggressive risk factor modification. Continue ongoing stroke workup. She may consider possible participation in the Sleep Smart  Stroke prevention study as she has strong possibility of having sleep apnea given  her body habitus. Discussed with DrXu. Greater than 50% and in this 25 minute visit was spent on counseling and coordination of care about her lacunar infarct in discussion about stroke prevention and treatment and answering questions.transfer to skilled nursing facility when bed available.  Delia HeadyPramod Edrick Whitehorn, MD Medical Director [facility] Pager: [phone] 10/07/2017 12:49 PM  To contact Stroke Continuity provider, please refer to [email or web address]. After hours, contact General Neurology

## 2017-10-07 NOTE — Progress Notes (Signed)
Pt reports new onset abdominal pain. 4/10. RN assessed,  MD notified.

## 2017-10-07 NOTE — Progress Notes (Signed)
Carotid artery duplex has been completed. 1-39% ICA stenosis bilaterally.  10/07/17 1:45 PM Olen CordialGreg Hall Birchard RVT

## 2017-10-08 DIAGNOSIS — I63311 Cerebral infarction due to thrombosis of right middle cerebral artery: Secondary | ICD-10-CM

## 2017-10-08 MED ORDER — IRBESARTAN 75 MG PO TABS
37.5000 mg | ORAL_TABLET | Freq: Every day | ORAL | Status: DC
  Administered 2017-10-09 – 2017-10-12 (×4): 37.5 mg via ORAL
  Filled 2017-10-08 (×4): qty 0.5

## 2017-10-08 MED ORDER — ASPIRIN EC 81 MG PO TBEC
81.0000 mg | DELAYED_RELEASE_TABLET | Freq: Every day | ORAL | Status: DC
  Administered 2017-10-08 – 2017-10-12 (×5): 81 mg via ORAL
  Filled 2017-10-08 (×5): qty 1

## 2017-10-08 MED ORDER — ATORVASTATIN CALCIUM 80 MG PO TABS
80.0000 mg | ORAL_TABLET | Freq: Every day | ORAL | Status: DC
  Administered 2017-10-08 – 2017-10-11 (×4): 80 mg via ORAL
  Filled 2017-10-08 (×4): qty 1

## 2017-10-08 NOTE — NC FL2 (Signed)
MEDICAID FL2 LEVEL OF CARE SCREENING TOOL     IDENTIFICATION  Patient Name: [name] [name] Birthdate: 09/23/1950 Sex: female Admission Date (Current Location): 10/06/2017  [facility] [place]   Facility and Address:  [facility], [address], [place], [place] [address]      Provider Number: [id number]  Attending Physician Name and Address:  Albertine GratesXu, Fang, MD  Relative Name and Phone Number:       Current Level of Care: Hospital Recommended Level of Care: Skilled Nursing Facility Prior Approval Number:    Date Approved/Denied:   PASRR Number: [id number]  Discharge Plan: SNF    Current Diagnoses: Patient Active Problem List   Diagnosis Date Noted  . Stroke (HCC) 10/06/2017  . Acute CVA (cerebrovascular accident) (HCC) 10/06/2017  . Trigger thumb, right thumb 05/03/2016  . 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 Protein-calorie malnutrition (HCC) 11/15/2014  . Osteomyelitis of right knee region (HCC) 11/08/2014  . Complication of device 11/04/2014  . Septic arthritis (HCC) 11/04/2014  . S/P right knee arthroscopy   . UTI (urinary tract infection) 12/09/2012  . 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 Hyperlipidemia   . Hypertension     Orientation RESPIRATION BLADDER Height & Weight     Self, Time, Situation, Place  Normal Incontinent Weight: 300 lb (136.1 kg) Height:  5\' 5"  (165.1 cm)  BEHAVIORAL SYMPTOMS/MOOD NEUROLOGICAL BOWEL NUTRITION STATUS      Incontinent Diet(Heart healthy, thin liquids)  AMBULATORY STATUS COMMUNICATION OF NEEDS Skin   Limited Assist Verbally Normal                       Personal Care Assistance Level of Assistance  Bathing, Feeding, Dressing Bathing Assistance: Limited assistance Feeding assistance: Independent Dressing Assistance: Limited assistance     Functional Limitations Info  Sight, Hearing, Speech Sight Info: Adequate Hearing Info:  Adequate Speech Info: Adequate    SPECIAL CARE FACTORS FREQUENCY  PT (By licensed PT), OT (By licensed OT)     PT Frequency: 2x OT Frequency: 2x            Contractures Contractures Info: Not present    Additional Factors Info  Code Status, Allergies Code Status Info: Full Code Allergies Info: NO known allergies           Current Medications (10/08/2017):  This is the current hospital active medication list Current Facility-Administered Medications  Medication Dose Route Frequency Provider Last Rate Last Dose  . acetaminophen (TYLENOL) tablet 650 mg  650 mg Oral Q6H PRN Albertine GratesXu, Fang, MD   650 mg at 10/08/17 0650  . aspirin EC tablet 81 mg  81 mg Oral Daily Marvel PlanXu, Jindong, MD   81 mg at 10/08/17 [id number]  . atorvastatin (LIPITOR) tablet 40 mg  40 mg Oral q1800 Richarda OverlieAbrol, Nayana, MD   40 mg at 10/07/17 1828  . clopidogrel (PLAVIX) tablet 75 mg  75 mg Oral Daily Albertine GratesXu, Fang, MD   75 mg at 10/08/17 [id number]  . enoxaparin (LOVENOX) injection 40 mg  40 mg Subcutaneous Q24H Richarda OverlieAbrol, Nayana, MD   40 mg at 10/07/17 1306  . labetalol (NORMODYNE,TRANDATE) injection 20 mg  20 mg Intravenous Q10 min PRN Dione BoozeGlick, David, MD   20 mg at 10/06/17 0250  . metoprolol succinate (TOPROL-XL) 24 hr tablet 100 mg  100 mg Oral Daily Richarda OverlieAbrol, Nayana, MD   100 mg at 10/08/17 0937  . mirabegron ER (MYRBETRIQ)  tablet 50 mg  50 mg Oral Daily Richarda Overlie, MD   50 mg at 10/08/17 0937  . trimethoprim (TRIMPEX) tablet 100 mg  100 mg Oral Daily Albertine Grates, MD   100 mg at 10/07/17 2118     Discharge Medications: Please see discharge summary for a list of discharge medications.  Relevant Imaging Results:  Relevant Lab Results:   Additional Information SSN: [id number]  Maree Krabbe, LCSW

## 2017-10-08 NOTE — Evaluation (Addendum)
Speech Language Pathology Evaluation Patient Details Name: [name] [name] MRN: [id number] DOB: 1951/01/04 Today's Date: 10/08/2017 Time: 1027-2536 SLP Time Calculation (min) (ACUTE ONLY): 31 min  Problem List:  Patient Active Problem List   Diagnosis Date Noted  . Stroke (HCC) 10/06/2017  . Acute CVA (cerebrovascular accident) (HCC) 10/06/2017  . Trigger thumb, right thumb 05/03/2016  . 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 Protein-calorie malnutrition (HCC) 11/15/2014  . Osteomyelitis of right knee region (HCC) 11/08/2014  . Complication of device 11/04/2014  . Septic arthritis (HCC) 11/04/2014  . S/P right knee arthroscopy   . UTI (urinary tract infection) 12/09/2012  . 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 Hyperlipidemia   . Hypertension    Past Medical History:  Past Medical History:  Diagnosis Date  . Arthritis    "right knee" (07/02/2015)  . Borderline diabetes   . Hyperlipidemia   . Hypertension   . Overactive bladder   . S/P right knee arthroscopy    with septic joint growing out MSSA  . Septic arthritis of knee, right (HCC)   . Wears glasses    Past Surgical History:  Past Surgical History:  Procedure Laterality Date  . COLONOSCOPY    . DIAGNOSTIC LAPAROSCOPY    . JOINT REPLACEMENT    . KNEE ARTHROSCOPY Right 12/09/2012   Procedure: ARTHROSCOPIC IRRIGATION AND DEBRIDEMENT RIGHT KNEE;  Surgeon: Budd Palmer, MD;  Location: [facility] OR;  Service: Orthopedics;  Laterality: Right;  . LEFT HEART CATH AND CORONARY ANGIOGRAPHY N/A 09/02/2016   Procedure: Left Heart Cath and Coronary Angiography;  Surgeon: Rinaldo Cloud, MD;  Location: [facility] INVASIVE CV LAB;  Service: Cardiovascular;  Laterality: N/A;  . 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/06/2014   Procedure: PLACEMENT OF CEMENT SPACER;  Surgeon: Loreta Ave, MD;  Location: [facility] OR;  Service: Orthopedics;  Laterality: Right;  . TOTAL KNEE ARTHROPLASTY Bilateral 2011-2012   left-right  .  TOTAL KNEE REVISION Right 11/06/2014   Procedure: REMOVAL OF TOTAL COMPONENTS, EXTENSIVE  IRRIGATION AND DEBRIDEMENT  RIGHT KNEE;  Surgeon: Loreta Ave, MD;  Location: [facility] OR;  Service: Orthopedics;  Laterality: Right;  . TOTAL KNEE REVISION Right 07/02/2015  . TOTAL KNEE REVISION Right 07/02/2015   Procedure: TOTAL KNEE REVISION;  Surgeon: Loreta Ave, MD;  Location: [facility] OR;  Service: Orthopedics;  Laterality: Right;  . TUBAL LIGATION  ~ 19868   HPI:  67 year old female with a history of hypertension, dyslipidemia, morbid obesity,Body mass index is 49.92 kg/m.,followed by Dr. Particia Jasper, HFrEF, most recent EF of 49% on stress test on 08/24/16 which showed moderate reversibility status postcardiac cath on 09/02/16 , which showed normal LV systolic function, nonobstructive CAD, EF of 55-65%,who presents to the ER today with left-sided weakness that began on Sunday. Patient noted difficulty with coordination, micrographia, slurred speech.she also complained of some diplopia which has resolved.  10/06/17 MRI head indicated Acute 1-1.5 cm right basal ganglia/radiating white matter tract infarction. No swelling or hemorrhage  Assessment / Plan / Recommendation Clinical Impression   Pt administered MOCA (Montreal Cognitive Assessment) with score of 27/30 noted to be within normal range (26/30 considered normal); slight dysarthria noted during complex conversation with OME revealing slight sensation loss in L facial area, but overall speech intelligibility noted to be 95% accurate with pt positioning affecting breath support for speech; cognitive/linguistic ability adequate during assessment; pt stated most symptoms have resolved, but graphic expression indicated decreased legibility with functional tasks and pt demonstrated  awareness of this deficit; vision affected prior to hospitalization with pt stating "I need new glasses." Pt able to read environmental signs without difficulty, but further testing not  completed d/t limited acuity.  No ST recommended at this time as pt is independent with cognitive/linguistic skills.  ST will s/o; thank you for this consult.    SLP Assessment  SLP Recommendation/Assessment: Patient does not need any further Speech Language Pathology Services SLP Visit Diagnosis: Other (comment)    Follow Up Recommendations  None    Frequency and Duration   Evaluation only        SLP Evaluation Cognition  Overall Cognitive Status: Within Functional Limits for tasks assessed Arousal/Alertness: Awake/alert Orientation Level: Oriented X4 Memory: Appears intact Awareness: Appears intact Problem Solving: Appears intact Safety/Judgment: Appears intact       Comprehension  Auditory Comprehension Overall Auditory Comprehension: Appears within functional limits for tasks assessed Yes/No Questions: Within Functional Limits Commands: Within Functional Limits Conversation: Complex Visual Recognition/Discrimination Discrimination: Within Function Limits(for environmental signs; stated needs new vision test prior) Reading Comprehension Reading Status: Unable to assess (comment)(fully d/t need for new glasses/limited accuracy)    Expression Expression Primary Mode of Expression: Verbal Verbal Expression Overall Verbal Expression: Appears within functional limits for tasks assessed Initiation: No impairment Level of Generative/Spontaneous Verbalization: Conversation Repetition: No impairment Naming: No impairment Pragmatics: No impairment Non-Verbal Means of Communication: Not applicable Written Expression Dominant Hand: Left Written Expression: Exceptions to [facility]FL Interfering Components: Legibility;Other (comment)(legible, but decreased from typical ability d/t weakness)   Oral / Motor  Oral Motor/Sensory Function Overall Oral Motor/Sensory Function: Mild impairment Facial ROM: Within Functional Limits Facial Symmetry: Within Functional Limits Facial Strength:  Within Functional Limits Facial Sensation: Reduced left;Other (Comment)(minimal) Lingual ROM: Within Functional Limits Lingual Symmetry: Within Functional Limits Lingual Strength: Within Functional Limits Lingual Sensation: Within Functional Limits Velum: Within Functional Limits Mandible: Within Functional Limits Motor Speech Overall Motor Speech: Appears within functional limits for tasks assessed Respiration: Within functional limits Phonation: Normal Resonance: Within functional limits Articulation: Within functional limitis Intelligibility: Intelligible Motor Planning: Witnin functional limits Motor Speech Errors: Not applicable                       Tressie StalkerPat Adams, M.S., CCC-SLP 10/08/2017, 12:05 PM

## 2017-10-08 NOTE — Progress Notes (Signed)
PROGRESS NOTE  [name] HarborMyrtle Dimock GNF:[id number] DOB: Nov 20, 1950 DOA: 10/06/2017 PCP: Shade FloodGreene, Jeffrey R, MD  HPI/Recap of past 24 hours:   Feeling better, speech is improving, left sided weakness is improving  She denies chest pain /ab pain, no headache  C/o left shoulder pain, able to raise left arm above shoulder   Assessment/Plan: Principal Problem:   Stroke [facility](HCC) Active Problems:   Hyperlipidemia   Hypertension   Acute CVA (cerebrovascular accident) (HCC)   Acute CVA:  right BG infarct secondary to small vessel disease   -With multiple vascular risk factors of morbid obesity, diabetes, hypertension, hyperlipidemia -she presented with slurred speech, left arm weakness -mri brain (Acute 1-1.5 cm right basal ganglia/radiating white matter tract infarction. No swelling or hemorrhage.) -2D Echo: mild left venrtical concentric hypertrophy. lvef wnl , no WMA, no embolic source -LDL 114mg  , on lipitor 40mg  daily ( was on 10mg  daily prior to hospitalization) -HgbA1c 6.2  -neurology input appreciated "Recommend dual antiplatelet therapy for 3 weeks followed by aspirin alone" -PT/OT recommended SNF placement, social worker consulted  Chest pain:  Denies chest pain today Troponin negative ekg sinus rhythm, no acute st/t changes Last cardiac cath in 2018 by Dr Sharyn LullHarwani She is to follow up with Dr Sharyn LullHarwani   HTN:  Allow permissive hypertension, gradually normalize bp Continue home meds toprolXL, gradually restart home meds diovan   Morbid obesity: Body mass index is 49.92 kg/m. Will benefit outpatient sleep study  Left shoulder pain -she is to follow up with orthopedic   Code Status: full  Family Communication: patient and daughter  Disposition Plan: snf when bed available   Consultants:  neurology  Procedures:  none  Antibiotics:  none   Objective: BP (!) 159/69 (BP Location: Left Arm)   Pulse 78   Temp 97.9 F (36.6 C) (Oral)   Resp 18   Ht 5\' 5"   (1.651 m)   Wt 136.1 kg   SpO2 100%   BMI 49.92 kg/m   Intake/Output Summary (Last 24 hours) at 10/08/2017 1609 Last data filed at 10/07/2017 1826 Gross per 24 hour  Intake 240 ml  Output -  Net 240 ml   Filed Weights   10/06/17 0049  Weight: 136.1 kg    Exam: Patient is examined daily including today on 10/08/2017, exams remain the same as of yesterday except that has changed    General:  NAD, obese  Cardiovascular: RRR  Respiratory: CTABL  Abdomen: Soft/ND/NT, positive BS  Musculoskeletal: No Edema  Neuro: alert, oriented   Data Reviewed: Basic Metabolic Panel: Recent Labs  Lab 10/06/17 0042 10/06/17 0119  NA 143 143  K 4.4 4.3  CL 109 110  CO2 24  --   GLUCOSE 100* 99  BUN 13 13  CREATININE 0.91 0.80  CALCIUM 9.9  --    Liver Function Tests: Recent Labs  Lab 10/06/17 0042  AST 23  ALT 18  ALKPHOS 95  BILITOT 0.4  PROT 7.3  ALBUMIN 3.8   No results for input(s): LIPASE, AMYLASE in the last 168 hours. No results for input(s): AMMONIA in the last 168 hours. CBC: Recent Labs  Lab 10/06/17 0042 10/06/17 0119  WBC 7.2  --   NEUTROABS 4.0  --   HGB 11.6* 13.6  HCT 40.7 40.0  MCV 77.5*  --   PLT 278  --    Cardiac Enzymes:   Recent Labs  Lab 10/06/17 1240 10/06/17 1910  TROPONINI <0.03 <0.03   BNP (last 3 results)  Recent Labs    10/06/17 0055  BNP 15.3    ProBNP (last 3 results) No results for input(s): PROBNP in the last 8760 hours.  CBG: Recent Labs  Lab 10/06/17 0109  GLUCAP 92    No results found for this or any previous visit (from the past 240 hour(s)).   Studies: No results found.  Scheduled Meds: . aspirin EC  81 mg Oral Daily  . atorvastatin  40 mg Oral q1800  . clopidogrel  75 mg Oral Daily  . enoxaparin (LOVENOX) injection  40 mg Subcutaneous Q24H  . metoprolol succinate  100 mg Oral Daily  . mirabegron ER  50 mg Oral Daily  . trimethoprim  100 mg Oral Daily    Continuous Infusions:   Time spent: 25  mins, case discussed with neurology I have personally reviewed and interpreted on  10/08/2017 daily labs, tele strips, imagings as discussed above under date review session and assessment and plans.  I reviewed all nursing notes, pharmacy notes, consultant notes,  vitals, pertinent old records  I have discussed plan of care as described above with RN , patient  on 10/08/2017   Albertine Grates MD, PhD  [facility] Pager [phone]. If 7PM-7AM, please contact night-coverage at [email or web address], password [email or web address] 10/08/2017, 4:09 PM  LOS: 2 days

## 2017-10-08 NOTE — Progress Notes (Signed)
STROKE TEAM PROGRESS NOTE     INTERVAL HISTORY Her nursing tech is at the bedside.   She feels she continues to improv.  Currently no significant weakness seen.  Vitals:   10/07/17 2358 10/08/17 0356 10/08/17 0817 10/08/17 1207  BP: (!) 158/70 (!) 141/74 (!) 158/73 (!) 161/77  Pulse: 77 68 70 71  Resp: 18 18 18    Temp: 98.2 F (36.8 C) 97.8 F (36.6 C) 97.8 F (36.6 C) 97.8 F (36.6 C)  TempSrc: Oral Oral Oral Oral  SpO2: 98% 98% 97% 99%  Weight:      Height:        CBC:  Recent Labs  Lab 10/06/17 0042 10/06/17 0119  WBC 7.2  --   NEUTROABS 4.0  --   HGB 11.6* 13.6  HCT 40.7 40.0  MCV 77.5*  --   PLT 278  --     Basic Metabolic Panel:  Recent Labs  Lab 10/06/17 0042 10/06/17 0119  NA 143 143  K 4.4 4.3  CL 109 110  CO2 24  --   GLUCOSE 100* 99  BUN 13 13  CREATININE 0.91 0.80  CALCIUM 9.9  --    Lipid Panel:     Component Value Date/Time   CHOL 174 10/07/2017 0403   TRIG 103 10/07/2017 0403   HDL 36 (L) 10/07/2017 0403   CHOLHDL 4.8 10/07/2017 0403   VLDL 21 10/07/2017 0403   LDLCALC 117 (H) 10/07/2017 0403   HgbA1c:  Lab Results  Component Value Date   HGBA1C 6.2 (H) 10/07/2017   Urine Drug Screen:     Component Value Date/Time   LABOPIA NONE DETECTED 10/07/2017 2225   COCAINSCRNUR NONE DETECTED 10/07/2017 2225   LABBENZ NONE DETECTED 10/07/2017 2225   AMPHETMU NONE DETECTED 10/07/2017 2225   THCU NONE DETECTED 10/07/2017 2225   LABBARB NONE DETECTED 10/07/2017 2225    Alcohol Level No results found for: [facility]  IMAGING  Dg Chest 2 View 10/06/2017 IMPRESSION:  Mild cardiomegaly with vascular congestion.    Ct Head Wo Contrast 10/06/2017 IMPRESSION:  1. Age indeterminate LEFT basal ganglia lacunar infarct.  2. Moderate to severe chronic small vessel ischemic changes.   Old small RIGHT cerebellar infarct.  3. Mild parenchymal brain volume loss for age.    Mri and Mra Head Wo Contrast 10/06/2017 IMPRESSION:  Acute 1-1.5 cm right  basal ganglia/radiating white matter tract infarction. No swelling or hemorrhage. Chronic small-vessel ischemic changes elsewhere throughout the brain as outlined above. No anterior circulation intracranial large or medium vessel abnormality. Atherosclerotic narrowing of the V4 segment of the right vertebral artery which is non dominant. No other posterior circulation abnormal finding.   Carotid Doppler 1-39% ICA stenosis bilaterally  TTE - Left ventricle: The cavity size was normal. There was mild   concentric hypertrophy. Systolic function was normal. The   estimated ejection fraction was in the range of 55% to 60%. Wall   motion was normal; there were no regional wall motion   abnormalities. - Atrial septum: No defect or patent foramen ovale was identified. Impressions: - No cardiac source of emboli was indentified.   PHYSICAL EXAM  morbidly obese middle-aged African-American lady not in distress. . Afebrile. Head is nontraumatic. Neck is supple without bruit.    Cardiac exam no murmur or gallop. Lungs are clear to auscultation. Distal pulses are well felt. Neurological Exam :  Awake alert oriented to time place and person. Mild dysarthria. No aphasia. Extraocular moments are full range  without nystagmus. Blinks to threat bilaterally. Fundi were not visualized. Vision acuity seems adequate. Mild left lower facial weakness. Tongue midline. Left hemiparesis with 5-/5 left upper extremity and left lower extremity strength. Sensation is symmetrical on the left compared to the right. Coordination intact bilaterally. Deep tendon reflexes 1+ and Babinski negative bilaterally. Gait not tested.   ASSESSMENT/PLAN [name] [name] is a 67 y.o. female with history of HTN, HLD, borderline DB, presenting with L HP that started on Sunday, developed CP.   Stroke:  right BG infarct secondary to small vessel disease due to multiple vascular risk factors of morbid obesity, diabetes, hypertension,  hyperlipidemia  CT head - age indeterminate left basal ganglia lacune.  Moderate to severe small vessel disease.  Old right cerebellar infarct.  Atrophy.  MRI - acute right basal ganglia white matter infarct.  Chronic small vessel disease.  MRA  - narrowing R VA V4  Carotid Doppler - 1-39% ICA stenosis bilaterally  2D Echo -  EF 55% to 60%. No cardiac source of emboli identified. [name] Kitchen.  LDL 117   HgbA1c 6.2   Lovenox 40 mg sq daily for VTE prophylaxis  aspirin 81 mg daily prior to admission, now on aspirin 81 and Plavix 75 DAPT for 3 weeks and then Plavix alone.  Therapy recommendations: SNF - May consider reevaluation of PT/OT  Disposition: pending  Hypertension  Stable . Permissive hypertension (OK if < 220/120) but gradually normalize in 5-7 days . Long-term BP goal normotensive  Hyperlipidemia  Home meds:  lipitor 40, resumed in hospital  LDL 114 , goal < 70  Increase Lipitor to 80  Continue statin at discharge  Diabetes type II  HgbA1c 6.2 goal < 7.0  Controlled  SSI  CBG monitoring  Other Stroke Risk Factors  Advanced age  Former Cigarette smoker, quit 25 yrs ago  Morbid Obesity, Body mass index is 49.92 kg/m., recommend weight loss, diet and exercise as appropriate   Needs OP eval for OSA - Sleep Smart  Stroke prevention study  Other Active Problems  Atypical CP  Hospital day # 2  Neurology will sign off. Please call with questions. [facility]. Thanks for the consult.  Marvel PlanJindong Katheen Aslin, MD PhD Stroke Neurology 10/08/2017 5:30 PM    To contact Stroke Continuity provider, please refer to [email or web address]. After hours, contact General Neurology

## 2017-10-09 MED ORDER — CLOTRIMAZOLE 1 % VA CREA
1.0000 | TOPICAL_CREAM | Freq: Every day | VAGINAL | Status: DC
  Administered 2017-10-09 – 2017-10-11 (×3): 1 via VAGINAL
  Filled 2017-10-09: qty 45

## 2017-10-09 NOTE — Progress Notes (Signed)
Physical Therapy Treatment Patient Details Name: [name] HarborMyrtle Alber MRN: [id number] DOB: September 28, 1950 Today's Date: 10/09/2017    History of Present Illness 67 yo female with onset of R basal ganglia stroke and L LE hemiparesis was admitted, noted chest tightness and SOB initially as well as EF 49%.  PMHx:  atherosclerosis, CAD, borderline DM, R knee sepsis, 26HTN67 yo female with onset of R basal ganglia stroke and L LE hemiparesis was admitted, noted chest tightness and SOB initially as well as EF 49%.  PMHx:  atherosclerosis, CAD, borderline DM, R knee sepsis, HTN    PT Comments    Patient seen for activity progression per new order from md. At this time, patient has progressed to one person assist for all aspects of mobility. Patient tolerated in room ambulation well and was able to perform various activities at EOB and in bed. Continues to show reliance on RW. Per patient, plan remains for ST SNF for further rehabilitation. Will continue to see and progress as tolerated.   Follow Up Recommendations  SNF     Equipment Recommendations  None recommended by PT    Recommendations for Other Services       Precautions / Restrictions Precautions Precautions: Fall Precaution Comments: home with daughter at all times Restrictions Weight Bearing Restrictions: No    Mobility  Bed Mobility Overal bed mobility: Needs Assistance Bed Mobility: Rolling;Supine to Sit;Sit to Supine Rolling: Supervision   Supine to sit: Min guard Sit to supine: Min assist   General bed mobility comments: Min assist to elevate LEs back to bed and reposition  Transfers Overall transfer level: Needs assistance Equipment used: Rolling walker (2 wheeled) Transfers: Sit to/from Stand Sit to Stand: Min assist         General transfer comment: min assist for stability during power up   Ambulation/Gait Ambulation/Gait assistance: Min assist Gait Distance (Feet): 30 Feet Assistive device: Rolling walker (2  wheeled) Gait Pattern/deviations: Step-through pattern;Decreased stride length;Decreased step length - left;Wide base of support;Trunk flexed Gait velocity: decreased Gait velocity interpretation: <1.8 ft/sec, indicate of risk for recurrent falls General Gait Details: patient with heavy reliance on RW, increased time and effort. Ecouragement for ambulation today   Stairs             Wheelchair Mobility    Modified Rankin (Stroke Patients Only) Modified Rankin (Stroke Patients Only) Pre-Morbid Rankin Score: No symptoms Modified Rankin: Moderate disability     Balance Overall balance assessment: History of Falls;Needs assistance Sitting-balance support: Feet supported Sitting balance-Leahy Scale: Fair     Standing balance support: Bilateral upper extremity supported;During functional activity Standing balance-Leahy Scale: Poor Standing balance comment: remains highly dependent on RW in standing positino                            Cognition Arousal/Alertness: Awake/alert Behavior During Therapy: WFL for tasks assessed/performed Overall Cognitive Status: Within Functional Limits for tasks assessed                                        Exercises Other Exercises Other Exercises: LLE modified straight leg raises Other Exercises: bilateral ankle pumps    General Comments        Pertinent Vitals/Pain Pain Assessment: No/denies pain Faces Pain Scale: Hurts little more Pain Location: abdomen Pain Descriptors / Indicators: Discomfort;Cramping Pain Intervention(s): Monitored during session  Home Living                      Prior Function            PT Goals (current goals can now be found in the care plan section) Acute Rehab PT Goals Patient Stated Goal: "be safe when I'm at home" PT Goal Formulation: With patient Time For Goal Achievement: 10/20/17 Potential to Achieve Goals: Good Progress towards PT goals: Progressing  toward goals    Frequency    Min 2X/week      PT Plan Current plan remains appropriate    Co-evaluation              AM-PAC PT "6 Clicks" Daily Activity  Outcome Measure  Difficulty turning over in bed (including adjusting bedclothes, sheets and blankets)?: A Little Difficulty moving from lying on back to sitting on the side of the bed? : Unable Difficulty sitting down on and standing up from a chair with arms (e.g., wheelchair, bedside commode, etc,.)?: Unable Help needed moving to and from a bed to chair (including a wheelchair)?: A Little Help needed walking in hospital room?: A Lot Help needed climbing 3-5 steps with a railing? : Total 6 Click Score: 11    End of Session Equipment Utilized During Treatment: Gait belt Activity Tolerance: Patient limited by fatigue(mm weakness) Patient left: in bed;with call bell/phone within reach Nurse Communication: Mobility status PT Visit Diagnosis: Unsteadiness on feet (R26.81);Muscle weakness (generalized) (M62.81);Difficulty in walking, not elsewhere classified (R26.2);Hemiplegia and hemiparesis Hemiplegia - Right/Left: Left Hemiplegia - dominant/non-dominant: Non-dominant Hemiplegia - caused by: Cerebral infarction     Time: 9528-4132 PT Time Calculation (min) (ACUTE ONLY): 18 min  Charges:  $Therapeutic Activity: 8-22 mins                     Charlotte Crumb, PT DPT  Board Certified Neurologic Specialist [phone]    Fabio Asa 10/09/2017, 2:56 PM

## 2017-10-09 NOTE — Progress Notes (Signed)
PROGRESS NOTE  [name] HarborMyrtle [name] EAV:[id number] DOB: [birth year] DOA: 10/06/2017 PCP: Shade FloodGreene, Jeffrey R, MD  HPI/Recap of past 24 hours:   Feeling better, speech is improving, left sided weakness is improving  She denies chest pain /ab pain, no headache  C/o left shoulder pain, able to raise left arm above shoulder  She reports vaginal itching and burning for the last few weeks   Assessment/Plan: Principal Problem:   Stroke [facility](HCC) Active Problems:   Hyperlipidemia   Hypertension   Acute CVA (cerebrovascular accident) (HCC)   Acute CVA:  right BG infarct secondary to small vessel disease   -With multiple vascular risk factors of morbid obesity, diabetes, hypertension, hyperlipidemia -she presented with slurred speech, left arm weakness -mri brain (Acute 1-1.5 cm right basal ganglia/radiating white matter tract infarction. No swelling or hemorrhage.) -2D Echo: mild left venrtical concentric hypertrophy. lvef wnl , no WMA, no embolic source -LDL 114mg  , on lipitor 40mg  daily ( was on 10mg  daily prior to hospitalization) -HgbA1c 6.2  -neurology input appreciated "Recommend dual antiplatelet therapy for 3 weeks followed by aspirin alone" -PT/OT recommended SNF placement, social worker consulted  Chest pain:  Denies chest pain today Troponin negative ekg sinus rhythm, no acute st/t changes Last cardiac cath in 2018 by Dr Sharyn LullHarwani She is to follow up with Dr Sharyn LullHarwani   HTN:  Allow permissive hypertension, gradually normalize bp Continue home meds toprolXL, gradually restart home meds diovan   Morbid obesity: Body mass index is 49.92 kg/m. Will benefit outpatient sleep study  Left shoulder pain -she is to follow up with orthopedic   Vaginal irritation: ua unremarkable,  Topical antifungal ordered  Code Status: full  Family Communication: patient   Disposition Plan: snf when bed available, hopefully on  monday   Consultants:  neurology  Procedures:  none  Antibiotics:  none   Objective: BP (!) 153/74 (BP Location: Left Wrist)   Pulse 81   Temp 97.8 F (36.6 C) (Oral)   Resp 19   Ht 5\' 5"  (1.651 m)   Wt 136.1 kg   SpO2 100%   BMI 49.92 kg/m   Intake/Output Summary (Last 24 hours) at 10/09/2017 1253 Last data filed at 10/09/2017 1200 Gross per 24 hour  Intake 600 ml  Output 1250 ml  Net -650 ml   Filed Weights   10/06/17 0049  Weight: 136.1 kg    Exam: Patient is examined daily including today on 10/09/2017, exams remain the same as of yesterday except that has changed    General:  NAD, obese  Cardiovascular: RRR  Respiratory: CTABL  Abdomen: Soft/ND/NT, positive BS  Musculoskeletal: No Edema  Neuro: alert, oriented   Data Reviewed: Basic Metabolic Panel: Recent Labs  Lab 10/06/17 0042 10/06/17 0119  NA 143 143  K 4.4 4.3  CL 109 110  CO2 24  --   GLUCOSE 100* 99  BUN 13 13  CREATININE 0.91 0.80  CALCIUM 9.9  --    Liver Function Tests: Recent Labs  Lab 10/06/17 0042  AST 23  ALT 18  ALKPHOS 95  BILITOT 0.4  PROT 7.3  ALBUMIN 3.8   No results for input(s): LIPASE, AMYLASE in the last 168 hours. No results for input(s): AMMONIA in the last 168 hours. CBC: Recent Labs  Lab 10/06/17 0042 10/06/17 0119  WBC 7.2  --   NEUTROABS 4.0  --   HGB 11.6* 13.6  HCT 40.7 40.0  MCV 77.5*  --   PLT 278  --  Cardiac Enzymes:   Recent Labs  Lab 10/06/17 1240 10/06/17 1910  TROPONINI <0.03 <0.03   BNP (last 3 results) Recent Labs    10/06/17 0055  BNP 15.3    ProBNP (last 3 results) No results for input(s): PROBNP in the last 8760 hours.  CBG: Recent Labs  Lab 10/06/17 0109  GLUCAP 92    No results found for this or any previous visit (from the past 240 hour(s)).   Studies: No results found.  Scheduled Meds: . aspirin EC  81 mg Oral Daily  . atorvastatin  80 mg Oral q1800  . clopidogrel  75 mg Oral Daily  .  clotrimazole  1 Applicatorful Vaginal QHS  . enoxaparin (LOVENOX) injection  40 mg Subcutaneous Q24H  . irbesartan  37.5 mg Oral Daily  . metoprolol succinate  100 mg Oral Daily  . mirabegron ER  50 mg Oral Daily  . trimethoprim  100 mg Oral Daily    Continuous Infusions:   Time spent: 15 mins I have personally reviewed and interpreted on  10/09/2017 daily labs, tele strips, imagings as discussed above under date review session and assessment and plans.  I reviewed all nursing notes, pharmacy notes, consultant notes,  vitals, pertinent old records  I have discussed plan of care as described above with RN , patient  on 10/09/2017   Albertine Grates MD, PhD  [facility] Pager [phone]. If 7PM-7AM, please contact night-coverage at [email or web address], password [email or web address] 10/09/2017, 12:53 PM  LOS: 3 days

## 2017-10-10 MED ORDER — CLOTRIMAZOLE 1 % VA CREA: 1.0000 | TOPICAL_CREAM | Freq: Every day | VAGINAL | 0 refills | Status: AC

## 2017-10-10 MED ORDER — ALUM & MAG HYDROXIDE-SIMETH 200-200-20 MG/5ML PO SUSP
30.0000 mL | Freq: Four times a day (QID) | ORAL | Status: DC | PRN
  Administered 2017-10-10: 30 mL via ORAL
  Filled 2017-10-10: qty 30

## 2017-10-10 MED ORDER — ACETAMINOPHEN 325 MG PO TABS: 650.0000 mg | ORAL_TABLET | Freq: Four times a day (QID) | ORAL | 0 refills | Status: DC | PRN

## 2017-10-10 MED ORDER — CALCIUM CARBONATE ANTACID 500 MG PO CHEW
400.0000 mg | CHEWABLE_TABLET | Freq: Once | ORAL | Status: DC
  Filled 2017-10-10: qty 2

## 2017-10-10 MED ORDER — VALSARTAN 160 MG PO TABS: 160.0000 mg | ORAL_TABLET | Freq: Every day | ORAL | 0 refills | Status: DC

## 2017-10-10 MED ORDER — NON FORMULARY: 20.0000 mg | Freq: Every day | Status: DC

## 2017-10-10 MED ORDER — ATORVASTATIN CALCIUM 80 MG PO TABS: 80.0000 mg | ORAL_TABLET | Freq: Every day | ORAL | 0 refills | Status: DC

## 2017-10-10 MED ORDER — CLOPIDOGREL BISULFATE 75 MG PO TABS: 75.0000 mg | ORAL_TABLET | Freq: Every day | ORAL | 0 refills | Status: DC

## 2017-10-10 MED ORDER — ASPIRIN EC 81 MG PO TBEC: 81.0000 mg | DELAYED_RELEASE_TABLET | Freq: Every day | ORAL | 0 refills | Status: AC

## 2017-10-10 MED ORDER — OMEPRAZOLE 20 MG PO CPDR
20.0000 mg | DELAYED_RELEASE_CAPSULE | Freq: Every day | ORAL | Status: DC
  Administered 2017-10-10 – 2017-10-12 (×3): 20 mg via ORAL
  Filled 2017-10-10 (×3): qty 1

## 2017-10-10 NOTE — Care Management Important Message (Signed)
Important Message  Patient Details  Name: [name] HarborMyrtle Mickley MRN: [id number] Date of Birth: Nov 21, 1950   Medicare Important Message Given:  Yes    Kiyla Ringler Stefan ChurchBratton 10/10/2017, 4:01 PM

## 2017-10-10 NOTE — Progress Notes (Signed)
CSW following for discharge plan. [facility]. CSW alerted MD.  CSW to follow.  Blenda NicelyElizabeth Treniece [place], [place] Clinical Social Worker [phone]

## 2017-10-10 NOTE — Progress Notes (Signed)
CSW met with patient earlier today to discuss bed offers. Patient interested in [place], which was still pending. CSW reached out to Admissions at [facility]; they are unable to offer a bed as the patient's insurance does not have any out of network benefits. CSW informed patient, provided alternative bed offers. Patient interested in [place]. CSW contacted Admissions at [facility]; they have no female beds available. CSW provided information to patient; she discussed with her daughter and they selected [place]. CSW contacted Admissions at [facility] and they will initiate insurance authorization request.  CSW to follow.  Laveda Abbe,  Clinical Social Worker [phone]

## 2017-10-10 NOTE — Plan of Care (Signed)
Pt resting quietly, just had large BM. Remains in SR on monitor. Denies other needs at this time.

## 2017-10-10 NOTE — Discharge Summary (Addendum)
Discharge Summary  [name] [name] UEA:[id number] DOB: [birth year]  PCP: Shade Flood, MD  Admit date: 10/06/2017 Discharge date: 10/10/2017  Time spent: , more than 50% time spent on coordination of care.  Recommendations for Outpatient Follow-up:  1. F/u with SNF MD for hospital discharge follow up, repeat cbc/bmp at follow up 2. F/u with neurology 3. pcp to refer patient to outpatient sleep study and weight management 4. F/u with Ortho Dr Lajoyce Corners for shoulder pain (patient reports she has seen Dr Lajoyce Corners for the same in the past, she prefers to see Dr Lajoyce Corners)  Discharge Diagnoses:  Active Hospital Problems   Diagnosis Date Noted  . Stroke (HCC) 10/06/2017  . Acute CVA (cerebrovascular accident) (HCC) 10/06/2017  . Hyperlipidemia   . Hypertension     Resolved Hospital Problems  No resolved problems to display.    Discharge Condition: stable  Diet recommendation: heart healthy/carb modified  Filed Weights   10/06/17 0049  Weight: 136.1 kg    History of present illness:  [facility] Course:  Principal Problem:   Stroke [facility]) Active Problems:   Hyperlipidemia   Hypertension   Acute CVA (cerebrovascular accident) (HCC)   Acute CVA: right BG infarct secondary to small vessel disease  -With multiple vascular risk factors of morbid obesity, diabetes, hypertension, hyperlipidemia -she presented with slurred speech, left arm weakness -mri brain (Acute 1-1.5 cm right basal ganglia/radiating white matter tract infarction. No swelling or hemorrhage.) -2D Echo: mild left venrtical concentric hypertrophy. lvef wnl , no WMA, no embolic source -LDL114mg  , discharge on lipitor 80mg  daily per neurology recommendation -HgbA1c6.2 -neurology input appreciated "Recommend dual antiplatelet therapy for 3 weeks followed by plavix alone" Please refer to neurology note from 8/24 "now on aspirin 81 and Plavix 75 DAPT for 3 weeks and then Plavix alone." -PT/OT recommended SNF  placement, social worker consulted  Chest pain:  Denies chest pain today Troponin negative ekg sinus rhythm, no acute st/t changes Last cardiac cath in 2018 by Dr Sharyn Lull She is to follow up with Dr Sharyn Lull   HTN:  Allow permissive hypertension, gradually normalize bp Continue home meds toprolXL, gradually restart home meds diovan   Morbid obesity: Body mass index is 49.92 kg/m. Will benefit outpatient sleep study Weight management program referral by pcp  Left shoulder pain -she is to follow up with orthopedic Dr Lajoyce Corners  Vaginal irritation: ua unremarkable,  Topical antifungal ordered  Code Status: full  Family Communication: patient and daughter at bedside  Disposition Plan: snf    Consultants:  neurology  Procedures:  none  Antibiotics:  none   Discharge Exam: BP (!) 174/55 (BP Location: Left Arm)   Pulse 83   Temp 98.1 F (36.7 C) (Oral)   Resp 18   Ht 5\' 5"  (1.651 m)   Wt 136.1 kg   SpO2 98%   BMI 49.92 kg/m     General:  NAD, obese  Cardiovascular: RRR  Respiratory: CTABL  Abdomen: Soft/ND/NT, positive BS  Musculoskeletal: No Edema  Neuro: alert, oriented   Discharge Instructions You were cared for by a hospitalist during your hospital stay. If you have any questions about your discharge medications or the care you received while you were in the hospital after you are discharged, you can call the unit and asked to speak with the hospitalist on call if the hospitalist that took care of you is not available. Once you are discharged, your primary care physician will handle any further medical issues. Please note that  NO REFILLS for any discharge medications will be authorized once you are discharged, as it is imperative that you return to your primary care physician (or establish a relationship with a primary care physician if you do not have one) for your aftercare needs so that they can reassess your need for medications and  monitor your lab values.  Discharge Instructions    Ambulatory referral to Neurology   Complete by:  As directed    Follow up with stroke clinic NP (Jessica Vanschaick or Darrol Angel, if both not available, consider Manson Allan, or A[facility]) at [facility] in about 4 weeks. Thanks.   Diet - low sodium heart healthy   Complete by:  As directed    Increase activity slowly   Complete by:  As directed      Allergies as of 10/10/2017   No Known Allergies     Medication List    STOP taking these medications   amoxicillin 500 MG capsule Commonly known as:  AMOXIL   BC FAST PAIN RELIEF 650-195-33.3 MG Pack Generic drug:  Aspirin-Salicylamide-Caffeine   methocarbamol 500 MG tablet Commonly known as:  ROBAXIN     TAKE these medications   acetaminophen 325 MG tablet Commonly known as:  TYLENOL Take 2 tablets (650 mg total) by mouth every 6 (six) hours as needed for moderate pain (headache).   aspirin EC 81 MG tablet Take 1 tablet (81 mg total) by mouth daily for 21 days.   atorvastatin 80 MG tablet Commonly known as:  LIPITOR Take 1 tablet (80 mg total) by mouth daily at 6 PM. What changed:    medication strength  how much to take  Another medication with the same name was removed. Continue taking this medication, and follow the directions you see here.   clopidogrel 75 MG tablet Commonly known as:  PLAVIX Take 1 tablet (75 mg total) by mouth daily. Start taking on:  10/11/2017   clotrimazole 1 % vaginal cream Commonly known as:  GYNE-LOTRIMIN Place 1 Applicatorful vaginally at bedtime for 7 days.   metoprolol succinate 100 MG 24 hr tablet Commonly known as:  TOPROL-XL Take 100 mg by mouth daily. Take with or immediately following a meal.   MYRBETRIQ 50 MG Tb24 tablet Generic drug:  mirabegron ER Take 50 mg by mouth daily.   nitroGLYCERIN 0.4 MG SL tablet Commonly known as:  NITROSTAT Place 0.4 mg under the tongue every 5 (five) minutes x 3 doses as needed. For chest  pain.   trimethoprim 100 MG tablet Commonly known as:  TRIMPEX Take 100 mg by mouth daily.   valsartan 160 MG tablet Commonly known as:  DIOVAN Take 1 tablet (160 mg total) by mouth daily.      No Known Allergies Follow-up Information    Nadara Mustard, MD Follow up in 1 week(s).   Specialty:  Orthopedic Surgery Why:  left shoudler pain and evaluate for shoulder injection Contact information: [address] [place] [address] [phone]        [facility]. Schedule an appointment as soon as possible for a visit in 4 week(s).   Specialty:  Radiology Contact information: [address] [place] [address] [phone]       Rinaldo Cloud, MD Follow up.   Specialty:  Cardiology Contact information: [address] [place] [address] [phone]            The results of significant diagnostics from  this hospitalization (including imaging, microbiology, ancillary and laboratory) are listed below for reference.    Significant Diagnostic Studies: Dg Chest 2 View  Result Date: 10/06/2017 CLINICAL DATA:  Shortness of breath EXAM: CHEST - 2 VIEW COMPARISON:  11/26/2015 FINDINGS: No pleural effusion. Cardiomegaly with mild vascular congestion. No focal airspace disease. No pneumothorax. Degenerative changes of the spine. IMPRESSION: Mild cardiomegaly with vascular congestion. Electronically Signed   By: Jasmine Pang M.D.   On: 10/06/2017 01:37   Ct Head Wo Contrast  Result Date: 10/06/2017 CLINICAL DATA:  Slurred speech, LEFT-sided weakness for 3 days. History of hypertension, hyperlipidemia. EXAM: CT HEAD WITHOUT CONTRAST TECHNIQUE: Contiguous axial images were obtained from the base of the skull through the vertex without intravenous contrast. COMPARISON:  None. FINDINGS: BRAIN: No intraparenchymal hemorrhage, mass effect nor midline shift. Age indeterminate LEFT basal ganglial  lacunar infarct. Mild parenchymal brain volume loss. No hydrocephalus. Patchy to confluent supratentorial white matter hypodensities. Old small RIGHT cerebellar infarct. No acute large vascular territory infarcts. No abnormal extra-axial fluid collections. Basal cisterns are patent. VASCULAR: Moderate to severe calcific atherosclerosis of the carotid siphons. SKULL: No skull fracture. No significant scalp soft tissue swelling. SINUSES/ORBITS: Partially imaged small LEFT maxillary mucosal retention cyst. Mastoid air cells are well aerated.The included ocular globes and orbital contents are non-suspicious. OTHER: None. IMPRESSION: 1. Age indeterminate LEFT basal ganglia lacunar infarct. 2. Moderate to severe chronic small vessel ischemic changes. Old small RIGHT cerebellar infarct. 3. Mild parenchymal brain volume loss for age. Electronically Signed   By: Awilda Metro M.D.   On: 10/06/2017 02:23   Mr Brain Wo Contrast  Result Date: 10/06/2017 CLINICAL DATA:  Left-sided weakness beginning 4 days ago. Speech disturbance. EXAM: MRI HEAD WITHOUT CONTRAST MRA HEAD WITHOUT CONTRAST TECHNIQUE: Multiplanar, multiecho pulse sequences of the brain and surrounding structures were obtained without intravenous contrast. Angiographic images of the head were obtained using MRA technique without contrast. COMPARISON:  CT same day FINDINGS: MRI HEAD FINDINGS Brain: 1-1.5 cm acute infarction in the right basal ganglia and radiating white matter tracts. No other acute or subacute infarction. Elsewhere, there are old small vessel infarctions within the inferior right cerebellum, thalami and cerebral hemispheric deep and subcortical white matter. No large vessel territory infarction. No mass lesion, hemorrhage hydrocephalus or extra-axial collection. Vascular: Major vessels at the base of the brain show flow. Skull and upper cervical spine: Negative Sinuses/Orbits: Clear/normal Other: None MRA HEAD FINDINGS Both internal carotid  arteries are patent through the skull base and siphon regions. No siphon stenosis. The anterior and middle cerebral vessels are patent without proximal stenosis, aneurysm or vascular malformation. Both vertebral arteries are patent with the left being dominant. There is atherosclerotic narrowing of the V4 segment of the right vertebral artery, but the vessel does remain patent to the basilar. Both posterior inferior cerebellar arteries show flow. The basilar artery shows mild atherosclerotic irregularity but no stenosis. Superior cerebellar and posterior cerebral arteries show flow. IMPRESSION: Acute 1-1.5 cm right basal ganglia/radiating white matter tract infarction. No swelling or hemorrhage. Chronic small-vessel ischemic changes elsewhere throughout the brain as outlined above. No anterior circulation intracranial large or medium vessel abnormality. Atherosclerotic narrowing of the V4 segment of the right vertebral artery which is non dominant. No other posterior circulation abnormal finding. Electronically Signed   By: Paulina Fusi M.D.   On: 10/06/2017 11:14   Dg Abd Portable 1v  Result Date: 10/07/2017 CLINICAL DATA:  Abdominal pain EXAM: PORTABLE ABDOMEN - 1 VIEW COMPARISON:  Portable exam 1129 hours compared to CT abdomen and pelvis of 02/27/2016 FINDINGS: Normal bowel gas pattern. No bowel dilatation or bowel wall thickening. Osseous structures unremarkable. No urinary tract calcification. IMPRESSION: No acute abnormalities. Electronically Signed   By: Ulyses SouthwardMark  Boles M.D.   On: 10/07/2017 11:38   Mr Maxine GlennMra Head Wo Contrast  Result Date: 10/06/2017 CLINICAL DATA:  Left-sided weakness beginning 4 days ago. Speech disturbance. EXAM: MRI HEAD WITHOUT CONTRAST MRA HEAD WITHOUT CONTRAST TECHNIQUE: Multiplanar, multiecho pulse sequences of the brain and surrounding structures were obtained without intravenous contrast. Angiographic images of the head were obtained using MRA technique without contrast.  COMPARISON:  CT same day FINDINGS: MRI HEAD FINDINGS Brain: 1-1.5 cm acute infarction in the right basal ganglia and radiating white matter tracts. No other acute or subacute infarction. Elsewhere, there are old small vessel infarctions within the inferior right cerebellum, thalami and cerebral hemispheric deep and subcortical white matter. No large vessel territory infarction. No mass lesion, hemorrhage hydrocephalus or extra-axial collection. Vascular: Major vessels at the base of the brain show flow. Skull and upper cervical spine: Negative Sinuses/Orbits: Clear/normal Other: None MRA HEAD FINDINGS Both internal carotid arteries are patent through the skull base and siphon regions. No siphon stenosis. The anterior and middle cerebral vessels are patent without proximal stenosis, aneurysm or vascular malformation. Both vertebral arteries are patent with the left being dominant. There is atherosclerotic narrowing of the V4 segment of the right vertebral artery, but the vessel does remain patent to the basilar. Both posterior inferior cerebellar arteries show flow. The basilar artery shows mild atherosclerotic irregularity but no stenosis. Superior cerebellar and posterior cerebral arteries show flow. IMPRESSION: Acute 1-1.5 cm right basal ganglia/radiating white matter tract infarction. No swelling or hemorrhage. Chronic small-vessel ischemic changes elsewhere throughout the brain as outlined above. No anterior circulation intracranial large or medium vessel abnormality. Atherosclerotic narrowing of the V4 segment of the right vertebral artery which is non dominant. No other posterior circulation abnormal finding. Electronically Signed   By: Paulina FusiMark  Shogry M.D.   On: 10/06/2017 11:14    Microbiology: No results found for this or any previous visit (from the past 240 hour(s)).   Labs: Basic Metabolic Panel: Recent Labs  Lab 10/06/17 0042 10/06/17 0119  NA 143 143  K 4.4 4.3  CL 109 110  CO2 24  --     GLUCOSE 100* 99  BUN 13 13  CREATININE 0.91 0.80  CALCIUM 9.9  --    Liver Function Tests: Recent Labs  Lab 10/06/17 0042  AST 23  ALT 18  ALKPHOS 95  BILITOT 0.4  PROT 7.3  ALBUMIN 3.8   No results for input(s): LIPASE, AMYLASE in the last 168 hours. No results for input(s): AMMONIA in the last 168 hours. CBC: Recent Labs  Lab 10/06/17 0042 10/06/17 0119  WBC 7.2  --   NEUTROABS 4.0  --   HGB 11.6* 13.6  HCT 40.7 40.0  MCV 77.5*  --   PLT 278  --    Cardiac Enzymes: Recent Labs  Lab 10/06/17 1240 10/06/17 1910  TROPONINI <0.03 <0.03   BNP: BNP (last 3 results) Recent Labs    10/06/17 0055  BNP 15.3    ProBNP (last 3 results) No results for input(s): PROBNP in the last 8760 hours.  CBG: Recent Labs  Lab 10/06/17 0109  GLUCAP 92       Signed:  Albertine GratesFang Kollen Armenti MD, PhD  [facility] 10/10/2017, 12:44 PM

## 2017-10-11 ENCOUNTER — Telehealth (INDEPENDENT_AMBULATORY_CARE_PROVIDER_SITE_OTHER): Payer: Self-pay | Admitting: Orthopedic Surgery

## 2017-10-11 NOTE — Clinical Social Work Note (Signed)
Authorization still pending.  Charlynn CourtSarah Harman Ferrin, CSW [phone]

## 2017-10-11 NOTE — Progress Notes (Signed)
Physical Therapy Treatment Patient Details Name: [name] [name] MRN: [id number] DOB: Jul 02, 1950 Today's Date: 10/11/2017    History of Present Illness 67 yo female with onset of R basal ganglia stroke and L LE hemiparesis was admitted, noted chest tightness and SOB initially as well as EF 49%.  PMHx:  atherosclerosis, CAD, borderline DM, R knee sepsis, HTN39 yo female with onset of R basal ganglia stroke and L LE hemiparesis was admitted, noted chest tightness and SOB initially as well as EF 49%.  PMHx:  atherosclerosis, CAD, borderline DM, R knee sepsis, HTN    PT Comments    Patient requires max encouragement to participate in therapies but overall is mobilizing at supervision levels.  Somewhat self limiting during session, requiring increased time and encouragement to perform self care tasks but requires no physical assist for these tasks if encouraged to do so.   Explained to patient the importance of mobility and reinforced that patient should not be utilizing a purewick at this time as patient is mobilizing at supervision levels and needs to begin participating in her own self care. Educated on preemptive bladder program (recommended every 1-2 hours to utilize commode as oppose to waiting for the urge.) Patient reluctant stating she doesn't want to get up to do that. Spoke with nursing regarding the importance of frequent mobilizations OOB to commode and progression of activity. Will continue to see and progress as tolerated   Follow Up Recommendations  SNF(vs home with HHPT and family supervision)     Equipment Recommendations  None recommended by PT    Recommendations for Other Services       Precautions / Restrictions Precautions Precautions: Fall Precaution Comments: home with daughter at all times Restrictions Weight Bearing Restrictions: No    Mobility  Bed Mobility Overal bed mobility: Needs Assistance Bed Mobility: Rolling;Supine to Sit;Sit to Supine Rolling:  Supervision   Supine to sit: Supervision     General bed mobility comments: supervision for all activities  Transfers Overall transfer level: Needs assistance Equipment used: Rolling walker (2 wheeled) Transfers: Sit to/from Stand Sit to Stand: Supervision         General transfer comment: no physical assist increased time to perform  Ambulation/Gait Ambulation/Gait assistance: Supervision Gait Distance (Feet): 130 Feet Assistive device: Rolling walker (2 wheeled) Gait Pattern/deviations: Step-through pattern;Decreased stride length;Decreased step length - left;Wide base of support;Trunk flexed Gait velocity: decreased Gait velocity interpretation: <1.8 ft/sec, indicate of risk for recurrent falls General Gait Details: patient steady with ambulation but at times self limiting   Stairs             Wheelchair Mobility    Modified Rankin (Stroke Patients Only) Modified Rankin (Stroke Patients Only) Pre-Morbid Rankin Score: No symptoms Modified Rankin: Moderate disability     Balance Overall balance assessment: History of Falls;Needs assistance Sitting-balance support: Feet supported Sitting balance-Leahy Scale: Fair     Standing balance support: Bilateral upper extremity supported;During functional activity Standing balance-Leahy Scale: Fair Standing balance comment: Was able to static stand without UE supported for functional task performance at sink                            Cognition Arousal/Alertness: Awake/alert Behavior During Therapy: WFL for tasks assessed/performed Overall Cognitive Status: Within Functional Limits for tasks assessed  Exercises      General Comments        Pertinent Vitals/Pain Pain Assessment: Faces Faces Pain Scale: Hurts little more Pain Location: back Pain Descriptors / Indicators: Discomfort;Cramping Pain Intervention(s): Monitored during session     Home Living                      Prior Function            PT Goals (current goals can now be found in the care plan section) Acute Rehab PT Goals Patient Stated Goal: "be safe when I'm at home" PT Goal Formulation: With patient Time For Goal Achievement: 10/20/17 Potential to Achieve Goals: Good Progress towards PT goals: Progressing toward goals    Frequency    Min 2X/week      PT Plan Current plan remains appropriate    Co-evaluation              AM-PAC PT "6 Clicks" Daily Activity  Outcome Measure  Difficulty turning over in bed (including adjusting bedclothes, sheets and blankets)?: A Little Difficulty moving from lying on back to sitting on the side of the bed? : A Little Difficulty sitting down on and standing up from a chair with arms (e.g., wheelchair, bedside commode, etc,.)?: A Little Help needed moving to and from a bed to chair (including a wheelchair)?: A Little Help needed walking in hospital room?: A Lot Help needed climbing 3-5 steps with a railing? : Total 6 Click Score: 15    End of Session Equipment Utilized During Treatment: Gait belt Activity Tolerance: Patient limited by fatigue(mm weakness) Patient left: in bed;with call bell/phone within reach Nurse Communication: Mobility status PT Visit Diagnosis: Unsteadiness on feet (R26.81);Muscle weakness (generalized) (M62.81);Difficulty in walking, not elsewhere classified (R26.2);Hemiplegia and hemiparesis Hemiplegia - Right/Left: Left Hemiplegia - dominant/non-dominant: Non-dominant Hemiplegia - caused by: Cerebral infarction     Time: 0921-0940 PT Time Calculation (min) (ACUTE ONLY): 19 min  Charges:  $Gait Training: 8-22 mins                     Charlotte Crumbevon Evann Koelzer, PT DPT  Board Certified Neurologic Specialist [phone]    Fabio AsaDevon J Alexandra Posadas 10/11/2017, 10:40 AM

## 2017-10-11 NOTE — Progress Notes (Signed)
Patient is medically ready to d/c to SNF, discharge summary  Done on 8/26, awaiting insurance Authorization.

## 2017-10-11 NOTE — Progress Notes (Signed)
Occupational Therapy Treatment Patient Details Name: [name] HarborMyrtle [name] MRN: [id number] DOB: Feb 02, 1951 Today's Date: 10/11/2017    History of present illness 67 yo female with onset of R basal ganglia stroke and L LE hemiparesis was admitted, noted chest tightness and SOB initially as well as EF 49%.  PMHx:  atherosclerosis, CAD, borderline DM, R knee sepsis, 30HTN67 yo female with onset of R basal ganglia stroke and L LE hemiparesis was admitted, noted chest tightness and SOB initially as well as EF 49%.  PMHx:  atherosclerosis, CAD, borderline DM, R knee sepsis, HTN   OT comments  Pt progressing toward OT goals. Pt provided with tan super soft theraputty this session and guided through L hand exercises to strength gross grasp needed for ADLs. Pt followed along with teachback performed.   Follow Up Recommendations  SNF;Supervision/Assistance - 24 hour    Equipment Recommendations  Other (comment)(defer to next venue)    Recommendations for Other Services      Precautions / Restrictions Precautions Precautions: Fall Precaution Comments: home with daughter at all times       Mobility Bed Mobility                  Transfers                      Balance                                           ADL either performed or assessed with clinical judgement   ADL                                               Vision       Perception     Praxis      Cognition Arousal/Alertness: Awake/alert Behavior During Therapy: WFL for tasks assessed/performed Overall Cognitive Status: Within Functional Limits for tasks assessed                                          Exercises Exercises: Hand exercises Hand Exercises Digit Composite Flexion: AROM;Left;Other (comment)(theraputty) Composite Extension: AROM;Left;5 reps Digit Composite Abduction: AROM;5 reps;Left(theraputty) Digit Composite Adduction: AROM;5  reps;Left(theraputty)   Shoulder Instructions       General Comments      Pertinent Vitals/ Pain       Pain Assessment: No/denies pain  Home Living                                          Prior Functioning/Environment              Frequency  Min 2X/week        Progress Toward Goals  OT Goals(current goals can now be found in the care plan section)  Progress towards OT goals: Progressing toward goals  Acute Rehab OT Goals Patient Stated Goal: "be safe when I'm at home" OT Goal Formulation: With patient Time For Goal Achievement: 10/21/17 Potential to Achieve Goals: Good  Plan Discharge plan remains appropriate    Co-evaluation  AM-PAC PT "6 Clicks" Daily Activity     Outcome Measure   Help from another person eating meals?: None Help from another person taking care of personal grooming?: A Little Help from another person toileting, which includes using toliet, bedpan, or urinal?: A Lot Help from another person bathing (including washing, rinsing, drying)?: A Lot Help from another person to put on and taking off regular upper body clothing?: A Little Help from another person to put on and taking off regular lower body clothing?: A Lot 6 Click Score: 16    End of Session    OT Visit Diagnosis: Unsteadiness on feet (R26.81);Other abnormalities of gait and mobility (R26.89);Muscle weakness (generalized) (M62.81);History of falling (Z91.81)   Activity Tolerance Patient tolerated treatment well   Patient Left in bed;with call bell/phone within reach;with bed alarm set   Nurse Communication          Time: 1610-9604 OT Time Calculation (min): 17 min  Charges: OT General Charges $OT Visit: 1 Visit OT Treatments $Therapeutic Activity: 8-22 mins   Crissie Reese OTR/L 10/11/2017, 3:03 PM

## 2017-10-11 NOTE — Telephone Encounter (Signed)
Patient called wanting Dr Lajoyce Cornersuda to know she has been in the hospital since Tuesday 10/04/17 suffering from a stroke. Patient said she is in [facility] Rm: [facility]. Patient asked if she can get an injection in her leftt arm and right knee while she is in the hospital?  The number to contact patient is [phone]

## 2017-10-12 ENCOUNTER — Telehealth (INDEPENDENT_AMBULATORY_CARE_PROVIDER_SITE_OTHER): Payer: Self-pay | Admitting: Orthopedic Surgery

## 2017-10-12 NOTE — Telephone Encounter (Signed)
Patient called advised she is being released from the hospital today at 1:00pm and will be going to [facility]  Rm: 203. Patient asked when can she get the injection in her left shoulder and right knee? The number to contact patient is [phone]

## 2017-10-12 NOTE — Plan of Care (Signed)
Pt has been progressing well, pt discharging to SNF today for continued therapy before returning home with family. Pt looks forward to therapy at next facility and then home.

## 2017-10-12 NOTE — Telephone Encounter (Signed)
Duplicate please see other message,.

## 2017-10-12 NOTE — Clinical Social Work Placement (Addendum)
Nurse to call report to [phone], Room 203   Transport scheduled for 1:00 PM     CLINICAL SOCIAL WORK PLACEMENT  NOTE  Date:  10/12/2017  Patient Details  Name: [name] [name] MRN: [id number] Date of Birth: 1950-06-07  Clinical Social Work is seeking post-discharge placement for this patient at the Skilled  Nursing Facility level of care (*CSW will initial, date and re-position this form in  chart as items are completed):  Yes   Patient/family provided with [facility] Clinical Social Work Department's list of facilities offering this level of care within the geographic area requested by the patient (or if unable, by the patient's family).  Yes   Patient/family informed of their freedom to choose among providers that offer the needed level of care, that participate in Medicare, Medicaid or managed care program needed by the patient, have an available bed and are willing to accept the patient.  Yes   Patient/family informed of [facility]'s ownership interest in [facility] and [facility], as well as of the fact that they are under no obligation to receive care at these facilities.  PASRR submitted to EDS on 10/08/17     PASRR number received on       Existing PASRR number confirmed on 10/08/17     FL2 transmitted to all facilities in geographic area requested by pt/family on 10/08/17     FL2 transmitted to all facilities within larger geographic area on       Patient informed that his/her managed care company has contracts with or will negotiate with certain facilities, including the following:        Yes   Patient/family informed of bed offers received.  [facility]     Physician recommends and patient chooses bed at      [facility] to be [facility] 10[facility].  Patient to be transferred to facility by PTAR     Patient family notified on 10/12/17 of transfer.  Name of family member notified:        PHYSICIAN        Additional Comment:    _______________________________________________ Baldemar LenisElizabeth M Reilley Valentine, LCSW 10/12/2017, 11:12 AM

## 2017-10-12 NOTE — Progress Notes (Signed)
Report called to Wilkie AyeKristy, nurse at [facility] that will be receiving the pt.

## 2017-10-12 NOTE — Progress Notes (Signed)
Housekeeping found an envelope in pt's room with bank deposit slips in it, staff member Delaney Meigsamara (NT/NS) contacted pt's daughter Erma PintoDemetra, and informed her of found slips. She requested the slips be shreaded., Eugenie NorrieYari, RN charge nurse shreaded these slips with witness of this RN, Delaney Meigsamara, NT/NS and Buel ReamYari,RN.

## 2017-10-12 NOTE — Progress Notes (Addendum)
Pt discharging to SNF, [facility]. D/C instructions reviewed with pt/pt's family and given a copy of instructions. Copy of instructions printed and sent with transporters for facility.    Pt did not leave at this time, pt wanting Dr Lajoyce Cornersuda to come to hospital to give pt a steroid injection in her R shoulder (? Knee also). Pt became upset with transporters because they were here and she wanted to get this injection done before leaving. Pt d/c instructions had instructions for pt to call for an appointment with Dr Lajoyce Cornersuda in 1 week. Pt also had already called the office and spoke with someone and a note can be seen in Epic from Dr Audrie Liauda's office.  With all this, pt upset and pt BP higher than her normal when PTAR took her BP.  SBP 190/ by PTAR, after pt settled some, BP down 180/83. ( pt has been running 150-170's SBP).  SW contacted facility and informed them and the DON was to be noitfied, before pt was brought to the facility.  A call was returned to SW and pt was ok to go to facility.     At 1400 Pt d/c'd with belongings, [facility].

## 2017-10-12 NOTE — Telephone Encounter (Signed)
I called and advised pt of message below. Pt upset because she states that going to rehab is going to increase the pain that she is having and that this was a problem prior to the stroke and she wants the injection. I advised that after a stroke the best thing she can do is to work with therapy on positioning and strengthen the muscles and that she could call the office after 1 month for eval. Pt voiced understanding she is not happy with this advise but will call back later.

## 2017-10-12 NOTE — Telephone Encounter (Signed)
Call patient, I recommend against steroid injections while she is recovering from the stroke.  Can proceed with injections once she is out of the hospital.

## 2017-10-12 NOTE — Progress Notes (Signed)
Patient briefly seen and examined, no family members at bedside.  She is stable for discharge to SNF following CVA.  Discharge summary was dictated by Dr.Xu on 8/26.  No amendments or changes to discharge summary necessary at this time.  Peggye PittEstela Hernandez, MD [facility] Pager: [phone]

## 2017-10-12 NOTE — Telephone Encounter (Signed)
Please see message below and advise.

## 2017-10-31 ENCOUNTER — Ambulatory Visit (INDEPENDENT_AMBULATORY_CARE_PROVIDER_SITE_OTHER): Payer: 59 | Admitting: Sports Medicine

## 2017-10-31 ENCOUNTER — Ambulatory Visit
Admission: RE | Admit: 2017-10-31 | Discharge: 2017-10-31 | Disposition: A | Discharge status: Home / Self Care | Payer: 59 | Source: Ambulatory Visit | Attending: Sports Medicine | Admitting: Sports Medicine

## 2017-10-31 ENCOUNTER — Encounter: Payer: Self-pay | Admitting: Sports Medicine

## 2017-10-31 VITALS — BP 186/88 | Ht 65.0 in | Wt 329.0 lb

## 2017-10-31 DIAGNOSIS — M25512 Pain in left shoulder: Secondary | ICD-10-CM | POA: Diagnosis not present

## 2017-10-31 DIAGNOSIS — M25511 Pain in right shoulder: Secondary | ICD-10-CM

## 2017-10-31 DIAGNOSIS — M545 Low back pain: Secondary | ICD-10-CM

## 2017-10-31 DIAGNOSIS — G8929 Other chronic pain: Secondary | ICD-10-CM | POA: Diagnosis not present

## 2017-10-31 NOTE — Progress Notes (Signed)
Subjective:    Patient ID: [name] [name], female    DOB: 05/11/1950, 67 y.o.   MRN: [id number]  HPI 67 year old female who presents for lower back pain, right knee pain, and bilateral shoulder pain.  She states her lower back pain is no significant these problems.  Her low back pain has been an ongoing problem for several years.  Last lumbar spine x-rays showed mild spondylosis without acute findings in 2012.  She states her pain is usually midline with very little radiation to the left or right.  We will usually feel the pain after about 10 minutes of standing.  She only tries BC powder and topical liniments for pain relief.  He does not endorse any burning tingling or numbness in her legs.  She occasionally feels problems when trying to flex her back.   Patient also presenting with bilateral shoulder pain.  Shoulder x-rays from 10/28/2016 showed osteophyte formation in both left and right shoulder.  She does not endorse any difficulty with range of motion in shoulders.  She states that certain overhead motions will cause her pain.  Does not endorse any weakness with her shoulders.  Again uses BC powder and topical liniments for shoulder relief.   Review of Systems Review of systems Per HPI  Past medical history, social history, allergies, medications reviewed    Objective:   Physical Exam GEN: No acute distress, alert, African American female.  Comfortable on exam table.  Obese Cards: Warm, well-perfused. Pulm: No increased work of breathing, no accessory muscle use Neuro: Antalgic gait noted, with obvious favoring of right knee.  No focal neurologic deficits appreciated.  Negative straight leg test bilaterally.  Intact sensation bilateral lower extre  Lower back Inspection: No notable asymmetry between left and right lower back.  No soft tissue swelling noted Palpation: Tenderness palpation at level L4-L5 midline.  No tenderness to palpation over left or right paraspinal  muscles. Strength: 5 out of 5 strength in hip flexors, 5 out of 5 strength in back extension and flexion.  Strength hamstring and quadricep. Range of motion: Full range of motion on back extension and flexion.  Full range of motion on torso rotation in both left and right Neurovascular: Less than 2-second cap refill time.  Warm well perfused.  Bilateral shoulders Inspection: Soft tissue swelling or gross deformity noted. Palpation: Tenderness to anterior shoulder.  No joint tenderness over AC joint or glenohumeral joint Strength: 5 out of 5 strength deltoid, supraspinatus, infraspinatus, latissimus, trapezius, biceps and triceps. Range of motion: Decreased active and passive range of motion especially with forward flexion, abduction, and external rotation. Neurovascular: Palpable radial pulse bilaterally.  Warm and perfused.  Cap refill less than 2 seconds.  Reviewed left and right shoulder x-ray from 2018.  Reviewed x-ray right knee 3 view from 08/2017.  Reviewed lumbar spine 2-3 view x-ray from 2012    Assessment & Plan:  67 year old female who presents for chronic pain in lower back and bilateral shoulder pain.  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  Did not have axillary views.  Further diagnosis can be confirmed with new shoulder x-rays, including an axillary view.  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  Will repeat lumbar spine x-rays to monitor progression.  Patient would likely do better she lost a significant amount of weight.  Current BMI of 54.8 greater than recommended BMI of 40 after knee replacement.  Bilateral shoulder pain -Bilateral shoulder x-rays,  AP and axillary views  Lower back pain -Repeat lumbar spine AP and lateral view  Right knee pain -Would benefit from weight loss -Recommend following up with PCP for weight loss management   Patient seen and evaluated with the resident.  I agree with the  above plan of care.  X-rays reviewed.  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  Patient will return to the office in a couple of days for IM Depo-Medrol and Toradol injections.  If shoulder pain persists afterwards then consider glenohumeral cortisone injections but her body habitus may dictate the need to send her to interventional radiology for this procedure.

## 2017-11-01 ENCOUNTER — Encounter: Payer: Self-pay | Admitting: Sports Medicine

## 2017-11-02 ENCOUNTER — Ambulatory Visit (INDEPENDENT_AMBULATORY_CARE_PROVIDER_SITE_OTHER): Payer: 59 | Admitting: Sports Medicine

## 2017-11-02 VITALS — BP 187/76 | Ht 65.0 in | Wt 329.0 lb

## 2017-11-02 DIAGNOSIS — M25511 Pain in right shoulder: Secondary | ICD-10-CM

## 2017-11-02 DIAGNOSIS — G8929 Other chronic pain: Secondary | ICD-10-CM

## 2017-11-02 DIAGNOSIS — M545 Low back pain: Secondary | ICD-10-CM | POA: Diagnosis not present

## 2017-11-02 DIAGNOSIS — M25512 Pain in left shoulder: Secondary | ICD-10-CM

## 2017-11-02 MED ORDER — KETOROLAC TROMETHAMINE 60 MG/2ML IM SOLN
60.0000 mg | Freq: Once | INTRAMUSCULAR | Status: AC
  Administered 2017-11-02: 60 mg via INTRAMUSCULAR

## 2017-11-02 MED ORDER — METHYLPREDNISOLONE ACETATE 80 MG/ML IJ SUSP
80.0000 mg | Freq: Once | INTRAMUSCULAR | Status: AC
  Administered 2017-11-02: 80 mg via INTRAMUSCULAR

## 2017-11-03 NOTE — Progress Notes (Signed)
  [name] [name] comes in today to discuss x-ray findings of both shoulders and her lumbar spine.  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  Mild degenerative changes in her lumbar spine.  Based on these findings I recommend that we try a simple IM Depo-Medrol injection and an IM Toradol injection today.  Follow-up with me in 3 weeks for reevaluation.  If shoulder pain persists consider intra-articular cortisone injections at that time.

## 2017-11-23 ENCOUNTER — Ambulatory Visit: Payer: 59 | Admitting: Adult Health

## 2017-11-23 ENCOUNTER — Telehealth: Payer: Self-pay

## 2017-11-23 NOTE — Progress Notes (Deleted)
[facility] [address] [place]. [place] [address] [phone]       OFFICE FOLLOW UP NOTE  [name] Date of Birth:  [birth year] Medical Record Number:  [id number]   Reason for Referral:  hospital stroke follow up  CHIEF COMPLAINT:  No chief complaint on file.   HPI: [name] [name] is being seen today for initial visit in the office for right BG white matter infarct on 10/05/2017. History obtained from *** and chart review. Reviewed all radiology images and labs personally.  [name] is a 67 y.o. female with history of HTN, HLD, borderline DB,  who presented with L HP that started on Sunday, developed CP.  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  MRA showed narrowing of the right VA V4.  Carotid Doppler showed bilateral ICA stenosis of 1 to 39%.  2D echo showed an EF of 55 to 60% without cardiac source of embolus identified.  LDL 114 and is recommended to increase Lipitor to 80 mg daily.  HTN stable during admission and recommended long-term BP goal normotensive range.  A1c 6.2 and recommended continued follow-up and management for DM with PCP.  Previously on aspirin 81 mg and recommended DAPT for 3 weeks and Plavix alone.  It was determined that the stroke was likely secondary to small vessel disease due to multiple vascular risk factors including morbid obesity, diabetes, hypertension and hyperlipidemia.  Was also recommended for patient to undergo sleep apnea work-up outpatient.  PT/OT recommended SNF placement and was discharged in stable condition to [facility]     ROS:   14 system review of systems performed and negative with exception of ***  PMH:  Past Medical History:  Diagnosis Date  . Arthritis    "right knee" (07/02/2015)  . Borderline  diabetes   . Hyperlipidemia   . Hypertension   . Overactive bladder   . S/P right knee arthroscopy    with septic joint growing out MSSA  . Septic arthritis of knee, right (HCC)   . Wears glasses     PSH:  Past Surgical History:  Procedure Laterality Date  . COLONOSCOPY    . DIAGNOSTIC LAPAROSCOPY    . JOINT REPLACEMENT    . KNEE ARTHROSCOPY Right 12/09/2012   Procedure: ARTHROSCOPIC IRRIGATION AND DEBRIDEMENT RIGHT KNEE;  Surgeon: Budd Palmer, MD;  Location: [facility] OR;  Service: Orthopedics;  Laterality: Right;  . LEFT HEART CATH AND CORONARY ANGIOGRAPHY N/A 09/02/2016   Procedure: Left Heart Cath and Coronary Angiography;  Surgeon: Rinaldo Cloud, MD;  Location: [facility] INVASIVE CV LAB;  Service: Cardiovascular;  Laterality: N/A;  . 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/06/2014   Procedure: PLACEMENT OF CEMENT SPACER;  Surgeon: Loreta Ave, MD;  Location: [facility] OR;  Service: Orthopedics;  Laterality: Right;  . TOTAL KNEE ARTHROPLASTY Bilateral 2011-2012   left-right  . TOTAL KNEE REVISION Right 11/06/2014   Procedure: REMOVAL OF TOTAL COMPONENTS, EXTENSIVE  IRRIGATION AND DEBRIDEMENT  RIGHT KNEE;  Surgeon: Loreta Ave, MD;  Location: [facility] OR;  Service: Orthopedics;  Laterality: Right;  . TOTAL KNEE REVISION Right 07/02/2015  . TOTAL KNEE REVISION Right 07/02/2015   Procedure: TOTAL KNEE REVISION;  Surgeon: Loreta Ave, MD;  Location: [facility] OR;  Service: Orthopedics;  Laterality: Right;  . TUBAL LIGATION  ~  1980    Social History:  Social History   Socioeconomic History  . Marital status: Divorced    Spouse name: Not on file  . Number of children: Not on file  . Years of education: Not on file  . Highest education level: Not on file  Occupational History  . Not on file  Social Needs  . Financial resource strain: Not on file  . Food insecurity:    Worry: Not on file    Inability: Not on file  . Transportation needs:    Medical: Not on file    Non-medical: Not on  file  Tobacco Use  . Smoking status: Former Smoker    Packs/day: 0.25    Years: 23.00    Pack years: 5.75    Types: Cigarettes    Last attempt to quit: 08/01/1992    Years since quitting: 25.3  . Smokeless tobacco: Never Used  Substance and Sexual Activity  . Alcohol use: No  . Drug use: No  . Sexual activity: Never  Lifestyle  . Physical activity:    Days per week: Not on file    Minutes per session: Not on file  . Stress: Not on file  Relationships  . Social connections:    Talks on phone: Not on file    Gets together: Not on file    Attends religious service: Not on file    Active member of club or organization: Not on file    Attends meetings of clubs or organizations: Not on file    Relationship status: Not on file  . Intimate partner violence:    Fear of current or ex partner: Not on file    Emotionally abused: Not on file    Physically abused: Not on file    Forced sexual activity: Not on file  Other Topics Concern  . Not on file  Social History Narrative  . Not on file    Family History:  Family History  Problem Relation Age of Onset  . Cancer - Other Mother   . Hypertension Other     Medications:   Current Outpatient Medications on File Prior to Visit  Medication Sig Dispense Refill  . acetaminophen (TYLENOL) 325 MG tablet Take 2 tablets (650 mg total) by mouth every 6 (six) hours as needed for moderate pain (headache). 30 tablet 0  . clopidogrel (PLAVIX) 75 MG tablet Take 1 tablet (75 mg total) by mouth daily. 30 tablet 0  . metoprolol succinate (TOPROL-XL) 100 MG 24 hr tablet Take 100 mg by mouth daily. Take with or immediately following a meal.    . MYRBETRIQ 50 MG TB24 tablet Take 50 mg by mouth daily.  11  . nitroGLYCERIN (NITROSTAT) 0.4 MG SL tablet Place 0.4 mg under the tongue every 5 (five) minutes x 3 doses as needed. For chest pain.  3  . trimethoprim (TRIMPEX) 100 MG tablet Take 100 mg by mouth daily.  11   No current facility-administered  medications on file prior to visit.     Allergies:  No Known Allergies   Physical Exam  There were no vitals filed for this visit. There is no height or weight on file to calculate BMI. No exam data present  General: well developed, well nourished, seated, in no evident distress Head: head normocephalic and atraumatic.   Neck: supple with no carotid or supraclavicular bruits Cardiovascular: regular rate and rhythm, no murmurs Musculoskeletal: no deformity Skin:  no rash/petichiae Vascular:  Normal pulses all  extremities  Neurologic Exam Mental Status: Awake and fully alert. Oriented to place and time. Recent and remote memory intact. Attention span, concentration and fund of knowledge appropriate. Mood and affect appropriate.  Cranial Nerves: Fundoscopic exam reveals sharp disc margins. Pupils equal, briskly reactive to light. Extraocular movements full without nystagmus. Visual fields full to confrontation. Hearing intact. Facial sensation intact. Face, tongue, palate moves normally and symmetrically.  Motor: Normal bulk and tone. Normal strength in all tested extremity muscles. Sensory.: intact to touch , pinprick , position and vibratory sensation.  Coordination: Rapid alternating movements normal in all extremities. Finger-to-nose and heel-to-shin performed accurately bilaterally. Gait and Station: Arises from chair without difficulty. Stance is normal. Gait demonstrates normal stride length and balance . Able to heel, toe and tandem walk without difficulty.  Reflexes: 1+ and symmetric. Toes downgoing.    NIHSS  *** Modified Rankin  ***    Diagnostic Data (Labs, Imaging, Testing)  Dg Chest 2 View 10/06/2017 IMPRESSION:  Mild cardiomegaly with vascular congestion.    Ct Head Wo Contrast 10/06/2017 IMPRESSION:  1. Age indeterminate LEFT basal ganglia lacunar infarct.  2. Moderate to severe chronic small vessel ischemic changes.              Old small RIGHT cerebellar  infarct.  3. Mild parenchymal brain volume loss for age.    Mri and Mra Head Wo Contrast 10/06/2017 IMPRESSION:  Acute 1-1.5 cm right basal ganglia/radiating white matter tract infarction. No swelling or hemorrhage. Chronic small-vessel ischemic changes elsewhere throughout the brain as outlined above. No anterior circulation intracranial large or medium vessel abnormality. Atherosclerotic narrowing of the V4 segment of the right vertebral artery which is non dominant. No other posterior circulation abnormal finding.   Carotid Doppler 1-39% ICA stenosis bilaterally  TTE - Left ventricle: The cavity size was normal. There was mild concentric hypertrophy. Systolic function was normal. The estimated ejection fraction was in the range of 55% to 60%. Wall motion was normal; there were no regional wall motion abnormalities. - Atrial septum: No defect or patent foramen ovale was identified. Impressions: - No cardiac source of emboli was indentified.   ASSESSMENT: [name] [name] is a 67 y.o. year old female here with left basal ganglia infarct on 10/06/2017 secondary to small vessel disease. Vascular risk factors include morbid obesity, HTN, HLD and DM.     PLAN: -Continue {anticoagulants:31417}  and ***  for secondary stroke prevention -F/u with PCP regarding your *** management -continue to monitor BP at home -advised to continue to stay active and maintain a healthy diet -Maintain strict control of hypertension with blood pressure goal below 130/90, diabetes with hemoglobin A1c goal below 6.5% and cholesterol with LDL cholesterol (bad cholesterol) goal below 70 mg/dL. I also advised the patient to eat a healthy diet with plenty of whole grains, cereals, fruits and vegetables, exercise regularly and maintain ideal body weight.  Follow up in *** or call earlier if needed   Greater than 50% of time during this 25 minute visit was spent on counseling,explanation of diagnosis of  ***, reviewing risk factor management of ***, planning of further management, discussion with patient and family and coordination of care    George Hugh, [facility]  [facility] [address] [place], [place] [address]  Phone [phone] Fax [phone] Note: This document was prepared with digital dictation and possible smart phrase technology. Any transcriptional errors that result from this process are unintentional.

## 2017-11-23 NOTE — Telephone Encounter (Signed)
Pt no show for appt today.

## 2017-11-23 NOTE — Progress Notes (Signed)
[facility] [address] [place]. [place] [address] [phone]       OFFICE FOLLOW UP NOTE  [name] Date of Birth:  1950-12-16 Medical Record Number:  [id number]   Reason for Referral:  hospital stroke follow up  CHIEF COMPLAINT:  Chief Complaint  Patient presents with  . Hospitalization Follow-up    Hospital follow up. Patient is accomplanied by her granddaughter. Rm 9. Patient stated that she has felt fine since she has been home.     HPI: [name] [name] is being seen today for initial visit in the office for right BG white matter infarct on 10/05/2017. History obtained from patient, granddaughter and chart review. Reviewed all radiology images and labs personally.  [name] [name] is a 67 y.o. female with history of HTN, HLD, borderline DB,  who presented with L HP that started on Sunday, developed CP.  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  MRA showed narrowing of the right VA V4.  Carotid Doppler showed bilateral ICA stenosis of 1 to 39%.  2D echo showed an EF of 55 to 60% without cardiac source of embolus identified.  LDL 114 and is recommended to increase Lipitor to 80 mg daily.  HTN stable during admission and recommended long-term BP goal normotensive range.  A1c 6.2 and recommended continued follow-up and management for DM with PCP.  Previously on aspirin 81 mg and recommended DAPT for 3 weeks and Plavix alone.  It was determined that the stroke was likely secondary to small vessel disease due to multiple vascular risk factors including morbid obesity, diabetes, hypertension and hyperlipidemia.  Was also recommended for patient to undergo sleep apnea work-up outpatient.  PT/OT recommended SNF placement and was discharged in stable condition to [facility]   Patient is being seen today for hospital follow-up and is accompanied by her granddaughter.  She resided at [facility] for 1 week and then was discharged home without therapy needs.  She is currently living with her daughter and grandchildren as she was unable to return to her home due to black mold.  She does plan on obtaining her own place soon to live independently.  She does continue to have residual left fine motor control weakness but this has been improving.  Denies any other residual deficits or recurring of symptoms.  She is currently sitting in wheelchair as she uses this for long distance due to increased shortness of breath with ambulation along with right knee pain which is chronic.  Patient will use a cane for short distance.  She has completed 3 weeks DAPT and currently on Plavix alone without side effects of bleeding or bruising.  She continues on atorvastatin 80 mg daily without side effects of myalgias.  Blood pressure today 161/76 and as patient does monitor this at home, SBP typically ranges from 1 50-1 60.  She states she was previously on antihypertensives prior to admission but her blood pressure continued to be at this range despite use of antihypertensives.  She will be seeing a new PCP on 12/13/2017 and will speak to her regarding HTN management.  She also plans on speaking to her regarding OSA referral and work-up.  No further concerns at this time and denies new or worsening stroke/TIA symptoms.    ROS:   14 system review of  systems performed and negative with exception of weight gain, swelling in legs, blurred vision, eye pain, shortness of breath, wheezing, snoring, feeling hot, increased thirst, flushing, cramping, aching muscles, allergies, not enough sleep and restless leg  PMH:  Past Medical History:  Diagnosis Date  . Arthritis    "right knee" (07/02/2015)  . Borderline diabetes   . Hyperlipidemia   . Hypertension   . Overactive bladder   . S/P right  knee arthroscopy    with septic joint growing out MSSA  . Septic arthritis of knee, right (HCC)   . Wears glasses     PSH:  Past Surgical History:  Procedure Laterality Date  . COLONOSCOPY    . DIAGNOSTIC LAPAROSCOPY    . JOINT REPLACEMENT    . KNEE ARTHROSCOPY Right 12/09/2012   Procedure: ARTHROSCOPIC IRRIGATION AND DEBRIDEMENT RIGHT KNEE;  Surgeon: Budd Palmer, MD;  Location: [facility] OR;  Service: Orthopedics;  Laterality: Right;  . LEFT HEART CATH AND CORONARY ANGIOGRAPHY N/A 09/02/2016   Procedure: Left Heart Cath and Coronary Angiography;  Surgeon: Rinaldo Cloud, MD;  Location: [facility] INVASIVE CV LAB;  Service: Cardiovascular;  Laterality: N/A;  . 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/06/2014   Procedure: PLACEMENT OF CEMENT SPACER;  Surgeon: Loreta Ave, MD;  Location: [facility] OR;  Service: Orthopedics;  Laterality: Right;  . TOTAL KNEE ARTHROPLASTY Bilateral 2011-2012   left-right  . TOTAL KNEE REVISION Right 11/06/2014   Procedure: REMOVAL OF TOTAL COMPONENTS, EXTENSIVE  IRRIGATION AND DEBRIDEMENT  RIGHT KNEE;  Surgeon: Loreta Ave, MD;  Location[facility] OR;  Service: Orthopedics;  Laterality: Right;  . TOTAL KNEE REVISION Right 07/02/2015  . TOTAL KNEE REVISION Right 07/02/2015   Procedure: TOTAL KNEE REVISION;  Surgeon: Loreta Ave, MD;  Location: [facility] OR;  Service: Orthopedics;  Laterality: Right;  . TUBAL LIGATION  ~ 1980    Social History:  Social History   Socioeconomic History  . Marital status: Divorced    Spouse name: Not on file  . Number of children: Not on file  . Years of education: Not on file  . Highest education level: Not on file  Occupational History  . Not on file  Social Needs  . Financial resource strain: Not on file  . Food insecurity:    Worry: Not on file    Inability: Not on file  . Transportation needs:    Medical: Not on file    Non-medical: Not on file  Tobacco Use  . Smoking status: Former Smoker    Packs/day: 0.25    Years:  23.00    Pack years: 5.75    Types: Cigarettes    Last attempt to quit: 08/01/1992    Years since quitting: 25.3  . Smokeless tobacco: Never Used  Substance and Sexual Activity  . Alcohol use: No  . Drug use: No  . Sexual activity: Never  Lifestyle  . Physical activity:    Days per week: Not on file    Minutes per session: Not on file  . Stress: Not on file  Relationships  . Social connections:    Talks on phone: Not on file    Gets together: Not on file    Attends religious service: Not on file    Active member of club or organization: Not on file    Attends meetings of clubs or organizations: Not on file    Relationship status: Not on file  . Intimate partner violence:    Fear  of current or ex partner: Not on file    Emotionally abused: Not on file    Physically abused: Not on file    Forced sexual activity: Not on file  Other Topics Concern  . Not on file  Social History Narrative  . Not on file    Family History:  Family History  Problem Relation Age of Onset  . Cancer - Other Mother   . Hypertension Other     Medications:   Current Outpatient Medications on File Prior to Visit  Medication Sig Dispense Refill  . acetaminophen (TYLENOL) 325 MG tablet Take 2 tablets (650 mg total) by mouth every 6 (six) hours as needed for moderate pain (headache). 30 tablet 0  . clopidogrel (PLAVIX) 75 MG tablet Take 1 tablet (75 mg total) by mouth daily. 30 tablet 0  . metoprolol succinate (TOPROL-XL) 100 MG 24 hr tablet Take 100 mg by mouth daily. Take with or immediately following a meal.    . MYRBETRIQ 50 MG TB24 tablet Take 50 mg by mouth daily.  11  . nitroGLYCERIN (NITROSTAT) 0.4 MG SL tablet Place 0.4 mg under the tongue every 5 (five) minutes x 3 doses as needed. For chest pain.  3  . trimethoprim (TRIMPEX) 100 MG tablet Take 100 mg by mouth daily.  11   No current facility-administered medications on file prior to visit.     Allergies:  No Known  Allergies   Physical Exam  Vitals:   11/24/17 0945  Weight: (!) 326 lb (147.9 kg)  Height: 5\' 5"  (1.651 m)   Body mass index is 54.25 kg/m. No exam data present  General: Morbidly obese pleasant middle-aged African-American female, seated, in no evident distress Head: head normocephalic and atraumatic.   Neck: supple with no carotid or supraclavicular bruits Cardiovascular: regular rate and rhythm, no murmurs; +3 pitting edema bilateral lower extremities Musculoskeletal: no deformity Skin:  no rash/petichiae Vascular:  Normal pulses all extremities  Neurologic Exam Mental Status: Awake and fully alert. Oriented to place and time. Recent and remote memory intact. Attention span, concentration and fund of knowledge appropriate. Mood and affect appropriate.  Cranial Nerves: Fundoscopic exam reveals sharp disc margins. Pupils equal, briskly reactive to light. Extraocular movements full without nystagmus. Visual fields full to confrontation. Hearing intact. Facial sensation intact. Face, tongue, palate moves normally and symmetrically.  Motor: Normal bulk and tone. Normal strength in all tested extremity muscles. Sensory.: intact to touch , pinprick , position and vibratory sensation.  Coordination: Rapid alternating movements normal in all extremities. Finger-to-nose and heel-to-shin performed accurately bilaterally.  Diminished left hand dexterity Gait and Station: Patient is currently sitting in wheelchair with cane present.  Patient ambulates with a hunched stance, short steps, with a waddle gait and assistance of cane Reflexes: 1+ and symmetric. Toes downgoing.    NIHSS  0 Modified Rankin  2    Diagnostic Data (Labs, Imaging, Testing)  Ct Head Wo Contrast 10/06/2017 IMPRESSION:  1. Age indeterminate LEFT basal ganglia lacunar infarct.  2. Moderate to severe chronic small vessel ischemic changes. Old small RIGHT cerebellar infarct.  3. Mild parenchymal brain volume loss for  age.   Mri and Mra Head Wo Contrast 10/06/2017 IMPRESSION:  Acute 1-1.5 cm right basal ganglia/radiating white matter tract infarction. No swelling or hemorrhage. Chronic small-vessel ischemic changes elsewhere throughout the brain as outlined above. No anterior circulation intracranial large or medium vessel abnormality. Atherosclerotic narrowing of the V4 segment of the right vertebral artery which is  non dominant. No other posterior circulation abnormal finding.   Carotid Doppler 1-39% ICA stenosis bilaterally  TTE - Left ventricle: The cavity size was normal. There was mild concentric hypertrophy. Systolic function was normal. The estimated ejection fraction was in the range of 55% to 60%. Wall motion was normal; there were no regional wall motion abnormalities. - Atrial septum: No defect or patent foramen ovale was identified. Impressions: - No cardiac source of emboli was indentified.   ASSESSMENT: [name] [name] is a 66 y.o. year old female here with left basal ganglia infarct on 10/06/2017 secondary to small vessel disease. Vascular risk factors include morbid obesity, HTN, HLD and DM.  Patient is being seen today for hospital follow-up and does have residual deficit of mild left hand fine motor control weakness but denies any other deficits.    PLAN: -Continue clopidogrel 75 mg daily  and Lipitor 80 mg for secondary stroke prevention -F/u with PCP regarding your HLD, DM and HTN management -advised to speak with PCP regarding need of HTN management due to continued elevated BP readings along with need of referral for OSA work-up -Advised to continue to stay active as tolerated with a healthy diet -Highly encouraged weight loss -continue to monitor BP at home -Maintain strict control of hypertension with blood pressure goal below 130/90, diabetes with hemoglobin A1c goal below 6.5% and cholesterol with LDL cholesterol (bad cholesterol) goal below 70 mg/dL. I also  advised the patient to eat a healthy diet with plenty of whole grains, cereals, fruits and vegetables, exercise regularly and maintain ideal body weight.  Follow up in 3 months or call earlier if needed   Greater than 50% of time during this 25 minute visit was spent on counseling,explanation of diagnosis of left BG infarct, reviewing risk factor management/monitoring of HLD, HTN, DM and morbid obesity, planning of further management, discussion with patient and family and coordination of care    George Hugh, AGNP-BC  [facility] [address] [place], [place] [address]  Phone [phone] Fax [phone] Note: This document was prepared with digital dictation and possible smart phrase technology. Any transcriptional errors that result from this process are unintentional.

## 2017-11-24 ENCOUNTER — Ambulatory Visit (INDEPENDENT_AMBULATORY_CARE_PROVIDER_SITE_OTHER): Payer: 59 | Admitting: Adult Health

## 2017-11-24 ENCOUNTER — Encounter: Payer: Self-pay | Admitting: Adult Health

## 2017-11-24 VITALS — BP 161/76 | HR 100 | Ht 65.0 in | Wt 326.0 lb

## 2017-11-24 DIAGNOSIS — I63511 Cerebral infarction due to unspecified occlusion or stenosis of right middle cerebral artery: Secondary | ICD-10-CM

## 2017-11-24 DIAGNOSIS — E782 Mixed hyperlipidemia: Secondary | ICD-10-CM | POA: Diagnosis not present

## 2017-11-24 DIAGNOSIS — I10 Essential (primary) hypertension: Secondary | ICD-10-CM

## 2017-11-24 NOTE — Progress Notes (Signed)
I agree with the above plan 

## 2017-11-24 NOTE — Patient Instructions (Addendum)
Continue clopidogrel 75 mg daily  and lipitor 80mg   for secondary stroke prevention  Continue to follow up with PCP regarding cholesterol, diabetes and blood pressure management along with referral for sleep apnea work up  Continue to stay active as tolerated and eat a healthy diet  Continue to monitor blood pressure at home  Maintain strict control of hypertension with blood pressure goal below 130/90, diabetes with hemoglobin A1c goal below 6.5% and cholesterol with LDL cholesterol (bad cholesterol) goal below 70 mg/dL. I also advised the patient to eat a healthy diet with plenty of whole grains, cereals, fruits and vegetables, exercise regularly and maintain ideal body weight.  Followup in the future with me in 3 months or call earlier if needed       Thank you for coming to see [place] at [facility]. I hope we have been able to provide you high quality care today.  You may receive a patient satisfaction survey over the next few weeks. We would appreciate your feedback and comments so that we may continue to improve ourselves and the health of our patients.

## 2017-11-28 ENCOUNTER — Ambulatory Visit (INDEPENDENT_AMBULATORY_CARE_PROVIDER_SITE_OTHER): Payer: 59 | Admitting: Sports Medicine

## 2017-11-28 VITALS — BP 166/84 | Ht 65.0 in | Wt 329.0 lb

## 2017-11-28 DIAGNOSIS — M25511 Pain in right shoulder: Secondary | ICD-10-CM

## 2017-11-28 DIAGNOSIS — G8929 Other chronic pain: Secondary | ICD-10-CM | POA: Diagnosis not present

## 2017-11-28 DIAGNOSIS — M25561 Pain in right knee: Secondary | ICD-10-CM | POA: Diagnosis not present

## 2017-11-28 MED ORDER — METHYLPREDNISOLONE ACETATE 40 MG/ML IJ SUSP
40.0000 mg | Freq: Once | INTRAMUSCULAR | Status: AC
  Administered 2017-11-28: 40 mg via INTRA_ARTICULAR

## 2017-11-28 NOTE — Patient Instructions (Signed)
[facility] Turner Daniels  Thursday 12/01/17 at 245pm [address] [place]   [phone]

## 2017-11-28 NOTE — Progress Notes (Signed)
   Subjective:    Patient ID: [name] [name], female    DOB: 09/14/1950, 67 y.o.   MRN: [id number]  HPI   Patient comes in today requesting a cortisone injection into the right knee.  She is also requesting a cortisone injection in the right shoulder.  She is status post right total knee arthroplasty done in 2017 by Dr. Richardson Landry.  Her postoperative course was complicated by infection.  This led to two more surgeries.  Her right knee pain is chronic.  She has a documented history of right shoulder osteoarthritis as well.  Her pain in the right shoulder is diffuse.  No numbness or tingling.  She has done well with previous cortisone injections in both shoulders.    Review of Systems As above    Objective:   Physical Exam  Obese.  No acute distress.  Awake alert and oriented x3.  Vital signs reviewed  Right knee shows a well-healed surgical incision.  Range of motion 0 to 110 degrees.  1+ boggy synovitis.  Diffuse tenderness to palpation.  Knee is grossly stable to ligamentous exam.  No erythema.  Right shoulder: Limited range of motion actively but passive range of motion is nearly full.  Positive anti-can, positive Hawkins.  Rotator cuff strength is 5/5 but reproducible of pain with resisted supraspinatus.  Neurovascularly intact distally.      Assessment & Plan:   Chronic right knee pain status post previous total knee arthroplasty with subsequent postoperative infection Right shoulder pain likely secondary to subacromial bursitis/tendinitis  I explained to the patient that I am not comfortable injecting her right knee since she is status post total knee arthroplasty.  This is a complicated issue and I think she would benefit from consultation with Dr. Turner Daniels at [facility].  Patient agrees with this plan.  For her right shoulder, I have injected her subacromial space today with cortisone.  Although previous x-rays show some arthritis I think her main pain generator is either  subacromial bursitis or rotator cuff tendinitis.  If shoulder pain persist despite today's injection then I would consider a diagnostic/therapeutic intra-articular cortisone injection prior to further diagnostic imaging.  I am going to defer further work-up and treatment in regards to the right knee to Dr. Turner Daniels and the patient will follow up with me as needed.  Consent obtained and verified. Time-out conducted. Noted no overlying erythema, induration, or other signs of local infection. Skin prepped in a sterile fashion. Topical analgesic spray: Ethyl chloride. Joint: right shoulder (subacromial) Needle: 25g 1.5 inch Completed without difficulty. Meds: 3cc 1% xylocaine, 1cc (40mg ) depomedrol  Advised to call if fevers/chills, erythema, induration, drainage, or persistent bleeding.

## 2017-12-09 ENCOUNTER — Other Ambulatory Visit (HOSPITAL_COMMUNITY): Payer: Self-pay | Admitting: Orthopedic Surgery

## 2017-12-09 DIAGNOSIS — M25561 Pain in right knee: Secondary | ICD-10-CM

## 2017-12-16 ENCOUNTER — Telehealth (INDEPENDENT_AMBULATORY_CARE_PROVIDER_SITE_OTHER): Payer: Self-pay | Admitting: Orthopedic Surgery

## 2017-12-16 NOTE — Telephone Encounter (Signed)
I tried to call patient to inform her that she needs to speak to her PCP about her fluid pill and that Dr Lajoyce Corners does not reqiure any recommendations for the dentist procedure. She would need to go by their requirements. Thank you.

## 2017-12-16 NOTE — Telephone Encounter (Signed)
Patient called requesting an RX for a fluid pill and also a antibiotic due to a dentist appointment.  [facility]  CB#[phone].  Thank you.

## 2017-12-19 ENCOUNTER — Encounter (HOSPITAL_COMMUNITY)
Admission: RE | Admit: 2017-12-19 | Discharge: 2017-12-19 | Disposition: A | Discharge status: Home / Self Care | Payer: 59 | Source: Ambulatory Visit | Attending: Orthopedic Surgery | Admitting: Orthopedic Surgery

## 2017-12-19 DIAGNOSIS — M25561 Pain in right knee: Secondary | ICD-10-CM | POA: Diagnosis not present

## 2017-12-19 MED ORDER — TECHNETIUM TC 99M MEDRONATE IV KIT
21.6000 | PACK | Freq: Once | INTRAVENOUS | Status: AC | PRN
  Administered 2017-12-19: 21.6 via INTRAVENOUS

## 2017-12-22 ENCOUNTER — Other Ambulatory Visit (HOSPITAL_BASED_OUTPATIENT_CLINIC_OR_DEPARTMENT_OTHER): Payer: Self-pay

## 2017-12-22 DIAGNOSIS — G4710 Hypersomnia, unspecified: Secondary | ICD-10-CM

## 2017-12-22 DIAGNOSIS — R0683 Snoring: Secondary | ICD-10-CM

## 2017-12-30 ENCOUNTER — Telehealth (INDEPENDENT_AMBULATORY_CARE_PROVIDER_SITE_OTHER): Payer: Self-pay | Admitting: Orthopedic Surgery

## 2017-12-30 NOTE — Telephone Encounter (Signed)
[facility] needs a referral  [phone]    [facility] to complete Therapy acrobatics

## 2018-01-10 ENCOUNTER — Ambulatory Visit (INDEPENDENT_AMBULATORY_CARE_PROVIDER_SITE_OTHER): Payer: 59 | Admitting: Sports Medicine

## 2018-01-10 ENCOUNTER — Other Ambulatory Visit (INDEPENDENT_AMBULATORY_CARE_PROVIDER_SITE_OTHER): Payer: Self-pay

## 2018-01-10 VITALS — BP 134/87 | Ht 65.0 in | Wt 329.0 lb

## 2018-01-10 DIAGNOSIS — M545 Low back pain, unspecified: Secondary | ICD-10-CM

## 2018-01-10 DIAGNOSIS — G8929 Other chronic pain: Secondary | ICD-10-CM | POA: Diagnosis not present

## 2018-01-10 DIAGNOSIS — M25561 Pain in right knee: Secondary | ICD-10-CM

## 2018-01-10 MED ORDER — METHYLPREDNISOLONE ACETATE 80 MG/ML IJ SUSP
80.0000 mg | Freq: Once | INTRAMUSCULAR | Status: AC
  Administered 2018-01-10: 80 mg via INTRAMUSCULAR

## 2018-01-10 MED ORDER — KETOROLAC TROMETHAMINE 60 MG/2ML IM SOLN
60.0000 mg | Freq: Once | INTRAMUSCULAR | Status: AC
  Administered 2018-01-10: 60 mg via INTRAMUSCULAR

## 2018-01-10 NOTE — Addendum Note (Signed)
Addended by: Annita BrodMOORE, Maevyn Riordan C on: 01/10/2018 03:57 PM   Modules accepted: Orders

## 2018-01-10 NOTE — Telephone Encounter (Signed)
Letter written ok to participate in aquatic therapy. Mailed to pt.

## 2018-01-10 NOTE — Progress Notes (Signed)
  Patient comes in today requesting a repeat injection for returning low back pain.  Recent x-rays showed some mild degenerative changes.  She received a positive benefit 2 months ago from IM Depo-Medrol and IM Toradol shots.  Patient was reinjected today with 80 mg of Depo-Medrol and 60 mg of Toradol.  If symptoms persist then we may need to consider further diagnostic imaging.  She is also requesting a referral for water aerobics and a motorized scooter.  I am happy to set her up with both of these.  Follow-up as needed.

## 2018-01-19 ENCOUNTER — Ambulatory Visit (HOSPITAL_BASED_OUTPATIENT_CLINIC_OR_DEPARTMENT_OTHER): Payer: 59 | Attending: Internal Medicine | Admitting: Internal Medicine

## 2018-01-19 VITALS — Ht 64.0 in | Wt 327.0 lb

## 2018-01-19 DIAGNOSIS — G4710 Hypersomnia, unspecified: Secondary | ICD-10-CM | POA: Diagnosis not present

## 2018-01-19 DIAGNOSIS — R0683 Snoring: Secondary | ICD-10-CM | POA: Diagnosis not present

## 2018-01-29 NOTE — Procedures (Signed)
   NAME: [name] [name] DATE OF BIRTH:  Jul 05, 1950 MEDICAL RECORD NUMBER [id number]  LOCATION: [facility]  PHYSICIAN: Deretha EmoryJames C Hali Balgobin  DATE OF STUDY: 01/19/2018  SLEEP STUDY TYPE: Nocturnal Polysomnogram               REFERRING PHYSICIAN: Deretha Emorysborne, Rameen Gohlke C, MD  INDICATION FOR STUDY: excessive daytime sleepiness, snoring, h/o stroke  EPWORTH SLEEPINESS SCORE:  NA HEIGHT: 5\' 4"  (162.6 cm)  WEIGHT: (!) 327 lb (148.3 kg)    Body mass index is 56.13 kg/m.  NECK SIZE: 17.5 in.  MEDICATIONS  Patient self administered medications include: N/A. Medications administered during study include No sleep medicine administered.[name] Kitchen.   SLEEP STUDY TECHNIQUE  A multi-channel overnight Polysomnography study was performed. The channels recorded and monitored were central and occipital EEG, electrooculogram (EOG), submentalis EMG (chin), nasal and oral airflow, thoracic and abdominal wall motion, anterior tibialis EMG, snore microphone, electrocardiogram, and a pulse oximetry.   TECHNICAL COMMENTS  Comments added by Technician: Patient was restless all through the night. Comments added by Scorer: N/A   SLEEP ARCHITECTURE  The study was initiated at 11:05:51 PM and terminated at 5:04:57 AM. The total recorded time was 359.1 minutes. EEG confirmed total sleep time was 225.5 minutes yielding a sleep efficiency of 62.8%%. Sleep onset after lights out was 6.4 minutes with a REM latency of 274.5 minutes. The patient spent 4.2%% of the night in stage N1 sleep, 84.0%% in stage N2 sleep, 0.0%% in stage N3 and 11.8% in REM. Wake after sleep onset (WASO) was 127.2 minutes. The Arousal Index was 26.3/hour.   RESPIRATORY PARAMETERS  There were a total of 75 respiratory disturbances out of which 4 were apneas ( 4 obstructive, 0 mixed, 0 central) and 71 hypopneas. The apnea/hypopnea index (AHI) was 20.0 events/hour. The central sleep apnea index was 0.0 events/hour. The REM AHI was 58.9 events/hour  and NREM AHI was 14.8 events/hour. The supine AHI was 20.0 events/hour. Respiratory disturbances were associated with oxygen desaturation down to a nadir of 68.0% during sleep. The mean oxygen saturation during the study was 93.0%. The cumulative time under 88% oxygen saturation was 5.5 minutes.  LEG MOVEMENT DATA  The total leg movements were 0 with a resulting leg movement index of 0.0/hr . Associated arousal with leg movement index was 0.0/hr.   CARDIAC DATA  The underlying cardiac rhythm was most consistent with sinus rhythm. Mean heart rate during sleep was 100.1 bpm. Additional rhythm abnormalities include PVCs.   IMPRESSIONS  Severe Oxygen Desaturation  Moderate Obstructive Sleep apnea(OSA)  DIAGNOSIS  Obstructive Sleep Apnea (327.23 [G47.33 ICD-10])  RECOMMENDATIONS  Therapeutic CPAP titration or auto-adjusting CPAP is recommended due to severity of OSA in REM sleep  Deretha EmoryJames C Tasheena [place] Sleep specialist, American Board of Internal Medicine  ELECTRONICALLY SIGNED ON:  01/29/2018, 9:06 PM [facility] PH: [phone]   FX: [phone] ACCREDITED BY THE AMERICAN ACADEMY OF SLEEP MEDICINE

## 2018-01-31 ENCOUNTER — Other Ambulatory Visit (HOSPITAL_BASED_OUTPATIENT_CLINIC_OR_DEPARTMENT_OTHER): Payer: Self-pay

## 2018-01-31 DIAGNOSIS — G4733 Obstructive sleep apnea (adult) (pediatric): Secondary | ICD-10-CM

## 2018-02-23 ENCOUNTER — Ambulatory Visit (INDEPENDENT_AMBULATORY_CARE_PROVIDER_SITE_OTHER): Payer: 59 | Admitting: Podiatry

## 2018-02-23 DIAGNOSIS — Q828 Other specified congenital malformations of skin: Secondary | ICD-10-CM | POA: Diagnosis not present

## 2018-02-23 DIAGNOSIS — M79671 Pain in right foot: Secondary | ICD-10-CM

## 2018-03-03 ENCOUNTER — Ambulatory Visit (INDEPENDENT_AMBULATORY_CARE_PROVIDER_SITE_OTHER): Payer: 59 | Admitting: Adult Health

## 2018-03-03 ENCOUNTER — Encounter: Payer: Self-pay | Admitting: Adult Health

## 2018-03-03 VITALS — BP 178/81 | HR 86 | Wt 319.2 lb

## 2018-03-03 DIAGNOSIS — R7303 Prediabetes: Secondary | ICD-10-CM | POA: Diagnosis not present

## 2018-03-03 DIAGNOSIS — I63511 Cerebral infarction due to unspecified occlusion or stenosis of right middle cerebral artery: Secondary | ICD-10-CM

## 2018-03-03 DIAGNOSIS — I10 Essential (primary) hypertension: Secondary | ICD-10-CM

## 2018-03-03 DIAGNOSIS — E782 Mixed hyperlipidemia: Secondary | ICD-10-CM

## 2018-03-03 NOTE — Progress Notes (Signed)
[facility] [address] [place] [id number] [phone]       OFFICE FOLLOW UP NOTE  [name] Date of Birth:  03-24-1950 Medical Record Number:  [id number]   Reason for visit: Right basal ganglia infarct follow-up visit  CHIEF COMPLAINT:  Chief Complaint  Patient presents with  . Follow-up    stroke follow up room 9 pt with     HPI:  Interval history 03/03/2018: Patient is being seen today for 557-month follow-up visit and is accompanied by her granddaughter.  Overall, she has been stable from a stroke stand point without residual deficits.  She has been participating in water aerobics for knee and back pain. She did have sleep study which showed severe oxygen desaturation and moderate obstructive sleep apnea.  Recommended CPAP titration or auto adjusting CPAP. She does have to go back for a CPAP titration 03/14/18. She has been having a difficult time with her medications due to side effects of leg pain, back pain and numbness. She states when she doesn't take any of her medications, she does not experience these symptoms.  She also has stopped all of her blood pressure medications and has been substituting antihypertensives with CBD oil and does feel as though she has had improvement of her blood pressure.  Blood pressure today 178/81 and she states this is due to her walking into the office.  She does continue to take Plavix intermittently without side effects of bleeding or bruising.  She does continue to take atorvastatin intermittently and questioning whether she is experiencing statin side effects.  She also endorses infrequent headaches with pressure behind her right eye and pressure on the top of her head.  She denies photophobia, phonophobia or nausea/vomiting.  She denies history of headaches or migraines.  She does endorse her granddaughter has history of migraines.  She denies these headaches to be debilitating and will typically resolve on their  own.  These headaches occur possibly once per month.  These have been present for the past year.  No further concerns at this time.  Denies new or worsening stroke/TIA symptoms.   11/24/2017 visit: Patient is being seen today for hospital follow-up and is accompanied by her granddaughter.  [facility] and then was discharged home without therapy needs.  She is currently living with her daughter and grandchildren as she was unable to return to her home due to black mold.  She does plan on obtaining her own place soon to live independently.  She does continue to have residual left fine motor control weakness but this has been improving.  Denies any other residual deficits or recurring of symptoms.  She is currently sitting in wheelchair as she uses this for long distance due to increased shortness of breath with ambulation along with right knee pain which is chronic.  Patient will use a cane for short distance.  She has completed 3 weeks DAPT and currently on Plavix alone without side effects of bleeding or bruising.  She continues on atorvastatin 80 mg daily without side effects of myalgias.  Blood pressure today 161/76 and as patient does monitor this at home, SBP typically ranges from 1 50-1 60.  She states she was previously on antihypertensives prior to admission but her blood pressure continued to be at this range despite use of antihypertensives.  She will be seeing a new PCP on 12/13/2017 and will speak to her regarding HTN management.  She also  plans on speaking to her regarding OSA referral and work-up.  No further concerns at this time and denies new or worsening stroke/TIA symptoms.   Hospital admission 10/06/2017: [name] is a 68 y.o. female with history of HTN, HLD, borderline DB,  who presented with L HP that started on Sunday, developed CP.  CT head reviewed and showed age-indeterminate left basal ganglia lacunae along with moderate to severe small vessel  disease and old right cerebellar infarct.  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  MRA showed narrowing of the right VA V4.  Carotid Doppler showed bilateral ICA stenosis of 1 to 39%.  2D echo showed an EF of 55 to 60% without cardiac source of embolus identified.  LDL 114 and is recommended to increase Lipitor to 80 mg daily.  HTN stable during admission and recommended long-term BP goal normotensive range.  A1c 6.2 and recommended continued follow-up and management for DM with PCP.  Previously on aspirin 81 mg and recommended DAPT for 3 weeks and Plavix alone.  It was determined that the stroke was likely secondary to small vessel disease due to multiple vascular risk factors including morbid obesity, diabetes, hypertension and hyperlipidemia.  Was also recommended for patient to undergo sleep apnea work-up outpatient.  PT/OT recommended SNF placement and was [facility].      ROS:   14 system review of systems performed and negative with exception of back pain, walking difficulty, joint pain,  PMH:  Past Medical History:  Diagnosis Date  . Arthritis    "right knee" (07/02/2015)  . Borderline diabetes   . Hyperlipidemia   . Hypertension   . Overactive bladder   . S/P right knee arthroscopy    with septic joint growing out MSSA  . Septic arthritis of knee, right (HCC)   . Wears glasses     PSH:  Past Surgical History:  Procedure Laterality Date  . COLONOSCOPY    . DIAGNOSTIC LAPAROSCOPY    . JOINT REPLACEMENT    . KNEE ARTHROSCOPY Right 12/09/2012   Procedure: ARTHROSCOPIC IRRIGATION AND DEBRIDEMENT RIGHT KNEE;  Surgeon: Budd PalmerMichael H Handy, MD;  Location: [facility] OR;  Service: Orthopedics;  Laterality: Right;  . LEFT HEART CATH AND CORONARY ANGIOGRAPHY N/A 09/02/2016   Procedure: Left Heart Cath and Coronary Angiography;  Surgeon: Rinaldo CloudHarwani, Mohan, MD;  [facility];  Service: Cardiovascular;  Laterality:  N/A;  . 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/06/2014   Procedure: PLACEMENT OF CEMENT SPACER;  Surgeon: Loreta Aveaniel F Murphy, MD;  [facility];  Service: Orthopedics;  Laterality: Right;  . TOTAL KNEE ARTHROPLASTY Bilateral 2011-2012   left-right  . TOTAL KNEE REVISION Right 11/06/2014   Procedure: REMOVAL OF TOTAL COMPONENTS, EXTENSIVE  IRRIGATION AND DEBRIDEMENT  RIGHT KNEE;  Surgeon: Loreta Aveaniel F Murphy, MD;  [facility];  Service: Orthopedics;  Laterality: Right;  . TOTAL KNEE REVISION Right 07/02/2015  . TOTAL KNEE REVISION Right 07/02/2015   Procedure: TOTAL KNEE REVISION;  Surgeon: Loreta Aveaniel F Murphy, MD;  [facility];  Service: Orthopedics;  Laterality: Right;  . TUBAL LIGATION  ~ 1980    Social History:  Social History   Socioeconomic History  . Marital status: Divorced    Spouse name: Not on file  . Number of children: Not on file  . Years of education: Not on file  . Highest education level: Not on file  Occupational History  . Not  on file  Social Needs  . Financial resource strain: Not on file  . Food insecurity:    Worry: Not on file    Inability: Not on file  . Transportation needs:    Medical: Not on file    Non-medical: Not on file  Tobacco Use  . Smoking status: Former Smoker    Packs/day: 0.25    Years: 23.00    Pack years: 5.75    Types: Cigarettes    Last attempt to quit: 08/01/1992    Years since quitting: 25.6  . Smokeless tobacco: Never Used  Substance and Sexual Activity  . Alcohol use: No  . Drug use: No  . Sexual activity: Never  Lifestyle  . Physical activity:    Days per week: Not on file    Minutes per session: Not on file  . Stress: Not on file  Relationships  . Social connections:    Talks on phone: Not on file    Gets together: Not on file    Attends religious service: Not on file    Active member of club or organization: Not on file    Attends meetings of clubs or organizations: Not on file    Relationship status: Not on  file  . Intimate partner violence:    Fear of current or ex partner: Not on file    Emotionally abused: Not on file    Physically abused: Not on file    Forced sexual activity: Not on file  Other Topics Concern  . Not on file  Social History Narrative  . Not on file    Family History:  Family History  Problem Relation Age of Onset  . Cancer - Other Mother   . Hypertension Other     Medications:   Current Outpatient Medications on File Prior to Visit  Medication Sig Dispense Refill  . amLODipine (NORVASC) 5 MG tablet Take 5 mg by mouth daily.  3  . amoxicillin (AMOXIL) 500 MG capsule     . atorvastatin (LIPITOR) 10 MG tablet Take 10 mg by mouth daily.  0  . atorvastatin (LIPITOR) 80 MG tablet Take 80 mg by mouth every evening.    . chlorthalidone (HYGROTON) 25 MG tablet Take 25 mg by mouth every morning.  0  . clopidogrel (PLAVIX) 75 MG tablet Take 1 tablet (75 mg total) by mouth daily. 30 tablet 0  . losartan (COZAAR) 50 MG tablet Take 50 mg by mouth daily.  3  . methocarbamol (ROBAXIN) 500 MG tablet TAKE 1 TABLET BY MOUTH TWICE DAILY BETWEEN MEALS AS NEEDED FOR MUSCLE SPASMS  3  . MYRBETRIQ 50 MG TB24 tablet Take 50 mg by mouth daily.  11  . nitroGLYCERIN (NITROSTAT) 0.4 MG SL tablet Place 0.4 mg under the tongue every 5 (five) minutes x 3 doses as needed. For chest pain.  3  . omeprazole (PRILOSEC) 20 MG capsule TAKE ONE CAPSULE BY MOUTH EVERY MORNING 30 MINUTES BEFORE BREAKFAST  3  . trimethoprim (TRIMPEX) 100 MG tablet Take 100 mg by mouth daily.  11   No current facility-administered medications on file prior to visit.     Allergies:  No Known Allergies   Physical Exam  There were no vitals filed for this visit. There is no height or weight on file to calculate BMI. No exam data present  General: Morbidly obese pleasant middle-aged African-American female, seated, in no evident distress Head: head normocephalic and atraumatic.   Neck: supple with no carotid  or  supraclavicular bruits Cardiovascular: regular rate and rhythm, no murmurs; +3 pitting edema bilateral lower extremities Musculoskeletal: no deformity Skin:  no rash/petichiae Vascular:  Normal pulses all extremities  Neurologic Exam Mental Status: Awake and fully alert. Oriented to place and time. Recent and remote memory intact. Attention span, concentration and fund of knowledge appropriate. Mood and affect appropriate.  Cranial Nerves: Fundoscopic exam reveals sharp disc margins. Pupils equal, briskly reactive to light. Extraocular movements full without nystagmus. Visual fields full to confrontation. Hearing intact. Facial sensation intact. Face, tongue, palate moves normally and symmetrically.  Motor: Normal bulk and tone. Normal strength in all tested extremity muscles. Sensory.: intact to touch , pinprick , position and vibratory sensation.  Coordination: Rapid alternating movements normal in all extremities. Finger-to-nose and heel-to-shin performed accurately bilaterally.  Diminished left hand dexterity Gait and Station: Patient is currently sitting in wheelchair with cane present.  Patient ambulates with a hunched stance, short steps, with a waddle gait and assistance of cane Reflexes: 1+ and symmetric. Toes downgoing.    NIHSS  0 Modified Rankin  2    Diagnostic Data (Labs, Imaging, Testing)  SLEEP STUDY 01/19/2018 [facility] IMPRESSIONS   Severe Oxygen Desaturation   Moderate Obstructive Sleep apnea(OSA)  DIAGNOSIS   Obstructive Sleep Apnea (327.23 [G47.33 ICD-10])  RECOMMENDATIONS   Therapeutic CPAP titration or auto-adjusting CPAP is recommended due to severity of OSA in REM sleep     ASSESSMENT: [name] [name] is a 68 y.o. year old female here with left basal ganglia infarct on 10/06/2017 secondary to small vessel disease. Vascular risk factors include morbid obesity, HTN, HLD and DM.  Patient recently underwent sleep study with  diagnosis of moderate OSA with initiation of CPAP.  Patient returns today for follow-up visit and has been stable from a stroke standpoint without residual deficit or recurring of symptoms.  She does endorse medication noncompliance due to potential side effects.    PLAN: -Continue clopidogrel 75 mg daily for secondary stroke prevention -Advised her to discontinue atorvastatin due to potential statin side effects.  Lipid panel obtained at today's appointment and did advise her that depending on her levels, additional statin may be started -F/u with PCP regarding your HLD, DM and HTN management -highly encouraged to schedule follow-up appointment as she has not been seen in over 1 years time. -Will obtain A1c and CBC/CMP per patient's request -f/u with sleep center for OSA management and initiation of CPAP -Educated patient on importance of taking all prescribed medications as recommended.  Advised her that if she experiences possible side effects from her medications, to notify her providers for possible change in management. -Advised to continue to stay active as tolerated with a healthy diet -Highly encouraged weight loss -continue to monitor BP at home -Maintain strict control of hypertension with blood pressure goal below 130/90, diabetes with hemoglobin A1c goal below 6.5% and cholesterol with LDL cholesterol (bad cholesterol) goal below 70 mg/dL. I also advised the patient to eat a healthy diet with plenty of whole grains, cereals, fruits and vegetables, exercise regularly and maintain ideal body weight.  Follow up in 6 months or call earlier if needed   Greater than 50% of time during this 25 minute visit was spent on counseling,explanation of diagnosis of left BG infarct, reviewing risk factor management/monitoring of HLD, HTN, DM and morbid obesity, planning of further management, importance of medication compliance, discussion with patient and family and coordination of  care    George Hugh,  AGNP-BC  [facility] [address] [place], [place] [address]  Phone [phone] Fax [phone] Note: This document was prepared with digital dictation and possible smart phrase technology. Any transcriptional errors that result from this process are unintentional.

## 2018-03-03 NOTE — Progress Notes (Signed)
I agree with the above plan 

## 2018-03-03 NOTE — Patient Instructions (Signed)
Continue clopidogrel 75 mg daily for secondary stroke prevention  Stop lipitor due to possible side effects. We will check cholesterol levels today and will consider starting a statin medications depending on your levels  We will check your diabetes level and CBC and CMP  Continue to follow up with PCP regarding cholesterol, blood pressure and pre-diabetes management   HIGHLY RECOMMEND to take all medications as prescribed. If difficulty tolerating, please let your providers know  Follow up with sleep center for sleep apnea treatment  Continue to monitor blood pressure at home  Maintain strict control of hypertension with blood pressure goal below 130/90, diabetes with hemoglobin A1c goal below 6.5% and cholesterol with LDL cholesterol (bad cholesterol) goal below 70 mg/dL. I also advised the patient to eat a healthy diet with plenty of whole grains, cereals, fruits and vegetables, exercise regularly and maintain ideal body weight.  Followup in the future with me in 6 months or call earlier if needed       Thank you for coming to see [place] at [facility]. I hope we have been able to provide you high quality care today.  You may receive a patient satisfaction survey over the next few weeks. We would appreciate your feedback and comments so that we may continue to improve ourselves and the health of our patients.

## 2018-03-04 LAB — CBC
HEMATOCRIT: 40.6 % (ref 34.0–46.6)
Hemoglobin: 12.5 g/dL (ref 11.1–15.9)
MCH: 22.8 pg — ABNORMAL LOW (ref 26.6–33.0)
MCHC: 30.8 g/dL — ABNORMAL LOW (ref 31.5–35.7)
MCV: 74 fL — ABNORMAL LOW (ref 79–97)
Platelets: 297 10*3/uL (ref 150–450)
RBC: 5.48 x10E6/uL — AB (ref 3.77–5.28)
RDW: 16.6 % — AB (ref 11.7–15.4)
WBC: 6.2 10*3/uL (ref 3.4–10.8)

## 2018-03-04 LAB — COMPREHENSIVE METABOLIC PANEL
ALT: 14 IU/L (ref 0–32)
AST: 18 IU/L (ref 0–40)
Albumin/Globulin Ratio: 1.6 (ref 1.2–2.2)
Albumin: 4.6 g/dL (ref 3.6–4.8)
Alkaline Phosphatase: 107 IU/L (ref 39–117)
BUN/Creatinine Ratio: 14 (ref 12–28)
BUN: 10 mg/dL (ref 8–27)
Bilirubin Total: <0.2 mg/dL (ref 0.0–1.2)
CALCIUM: 10.1 mg/dL (ref 8.7–10.3)
CO2: 22 mmol/L (ref 20–29)
CREATININE: 0.73 mg/dL (ref 0.57–1.00)
Chloride: 107 mmol/L — ABNORMAL HIGH (ref 96–106)
GFR calc Af Amer: 99 mL/min/{1.73_m2} (ref >59)
GFR, EST NON AFRICAN AMERICAN: 86 mL/min/{1.73_m2} (ref >59)
GLOBULIN, TOTAL: 2.8 g/dL (ref 1.5–4.5)
Glucose: 94 mg/dL (ref 65–99)
Potassium: 4.7 mmol/L (ref 3.5–5.2)
SODIUM: 144 mmol/L (ref 134–144)
Total Protein: 7.4 g/dL (ref 6.0–8.5)

## 2018-03-04 LAB — LIPID PANEL
Chol/HDL Ratio: 3.4 ratio (ref 0.0–4.4)
Cholesterol, Total: 188 mg/dL (ref 100–199)
HDL: 56 mg/dL (ref >39)
LDL Calculated: 117 mg/dL — ABNORMAL HIGH (ref 0–99)
Triglycerides: 77 mg/dL (ref 0–149)
VLDL Cholesterol Cal: 15 mg/dL (ref 5–40)

## 2018-03-04 LAB — HEMOGLOBIN A1C
ESTIMATED AVERAGE GLUCOSE: 126 mg/dL
Hgb A1c MFr Bld: 6 % — ABNORMAL HIGH (ref 4.8–5.6)

## 2018-03-06 ENCOUNTER — Telehealth: Payer: Self-pay | Admitting: Adult Health

## 2018-03-06 ENCOUNTER — Other Ambulatory Visit: Payer: Self-pay | Admitting: Adult Health

## 2018-03-06 ENCOUNTER — Telehealth: Payer: Self-pay

## 2018-03-06 MED ORDER — ROSUVASTATIN CALCIUM 20 MG PO TABS: 20.0000 mg | ORAL_TABLET | Freq: Every day | ORAL | 3 refills | Status: DC

## 2018-03-06 NOTE — Telephone Encounter (Signed)
Spoke with [name] [name] at [facility] [employer] and I advised her that per Shanda Bumps Atorvastatin has already been discontinued due to potential side effects. [name] [name] verbalized understanding. No other questions or concerns at this time.

## 2018-03-06 NOTE — Telephone Encounter (Signed)
-----   Message from George Hugh, NP sent at 03/06/2018  6:40 AM EST ----- Please notify patient that her recent lipid panel continues to show elevated LDL or bad cholesterol at 119 with a goal of less than 70.  Recommend to initiate Crestor 20 mg daily for HDL management.

## 2018-03-06 NOTE — Telephone Encounter (Signed)
Spoke with Thayer Ohm at [employer] and she stated does she need to discontinue the patient's Lipitor since she is starting Crestor. Please advise

## 2018-03-06 NOTE — Telephone Encounter (Signed)
Tech Chris @ [facility] [employer] is asking for a call TR:[id number] (CRESTOR) 20 MG tablet

## 2018-03-06 NOTE — Telephone Encounter (Signed)
Yes.  Atorvastatin has already been discontinued due to potential side effects.  Due to side effects, recommend to start Crestor due to continued elevated LDL levels.

## 2018-03-06 NOTE — Telephone Encounter (Signed)
Spoke with the patient and she verbalized understanding her results. No questions or concerns at this time.   

## 2018-03-14 ENCOUNTER — Ambulatory Visit (HOSPITAL_BASED_OUTPATIENT_CLINIC_OR_DEPARTMENT_OTHER): Payer: 59 | Attending: Internal Medicine | Admitting: Internal Medicine

## 2018-03-14 VITALS — Ht 64.0 in | Wt 316.0 lb

## 2018-03-14 DIAGNOSIS — G4733 Obstructive sleep apnea (adult) (pediatric): Secondary | ICD-10-CM | POA: Diagnosis not present

## 2018-03-17 ENCOUNTER — Encounter: Payer: Self-pay | Admitting: Podiatry

## 2018-03-17 NOTE — Progress Notes (Signed)
Subjective:  Patient presents to clinic with cc of  painful porokeratotic lesion right foot  which is aggravated when weightbearing with and without shoe gear.  This pain limits her daily activities. Pain symptoms resolve with periodic professional debridement.  Patient states she cannot tolerate treatment of the lesion without an injection.  Laurann MontanaWhite, Cynthia, MD is her PCP.   Current Outpatient Medications:  .  amoxicillin (AMOXIL) 500 MG capsule, , Disp: , Rfl:  .  chlorthalidone (HYGROTON) 25 MG tablet, Take 25 mg by mouth every morning., Disp: , Rfl: 0 .  clopidogrel (PLAVIX) 75 MG tablet, Take 1 tablet (75 mg total) by mouth daily., Disp: 30 tablet, Rfl: 0 .  methocarbamol (ROBAXIN) 500 MG tablet, TAKE 1 TABLET BY MOUTH TWICE DAILY BETWEEN MEALS AS NEEDED FOR MUSCLE SPASMS, Disp: , Rfl: 3 .  MYRBETRIQ 50 MG TB24 tablet, Take 50 mg by mouth daily., Disp: , Rfl: 11 .  nitroGLYCERIN (NITROSTAT) 0.4 MG SL tablet, Place 0.4 mg under the tongue every 5 (five) minutes x 3 doses as needed. For chest pain., Disp: , Rfl: 3 .  omeprazole (PRILOSEC) 20 MG capsule, TAKE ONE CAPSULE BY MOUTH EVERY MORNING 30 MINUTES BEFORE BREAKFAST, Disp: , Rfl: 3 .  trimethoprim (TRIMPEX) 100 MG tablet, Take 100 mg by mouth daily., Disp: , Rfl: 11 .  rosuvastatin (CRESTOR) 20 MG tablet, Take 1 tablet (20 mg total) by mouth daily., Disp: 30 tablet, Rfl: 3   No Known Allergies   Objective:  Physical Examination: Neurovascular status remains unchanged with palpable pedal pulses bilaterally  Sensation intact with 10 g monofilament bilaterally.  Muscle strength 5/5 to all muscle groups b/l LE.  She utilizes cane for gait assistance.  Intractable hyperkeratotic lesion submetatarsal head 5 of the right foot.  There is no erythema, no edema, no drainage, no flocculence noted.  Assessment: Painful porokeratotic lesion submetatarsal head right foot  Plan: Localized injection consisting of a total of 3 cc of a 50-50  mixture 1% lidocaine plain and 0.5% Marcaine plain administered to the periphery of the lesion.  Painful porokeratotoc lesion pared and enucleated.  Light bleeding addressed with Lumicain hemostatic solution.  Antibiotic ointment and a Band-Aid was applied.  She may remove Band-Aid tomorrow.  No further treatment required by patient.   Patient noted relief post procedure.  Patient states she will call to make her next scheduled appointment.  Continue soft, supportive shoe gear daily.  Report any pedal injuries to medical professional immediately.

## 2018-03-19 NOTE — Procedures (Signed)
   NAME: [name] [name] DATE OF BIRTH:  1950/04/18 MEDICAL RECORD NUMBER [id number]  LOCATION: [facility]  PHYSICIAN: Deretha Emory  DATE OF STUDY: 03/14/2018  SLEEP STUDY TYPE: Positive Airway Pressure Titration               REFERRING PHYSICIAN: Deretha Emory, MD  INDICATION FOR STUDY: Moderate OSA and severe hypoxemia noted on PSG  EPWORTH SLEEPINESS SCORE:   HEIGHT: 5\' 4"  (162.6 cm)  WEIGHT: (!) 316 lb (143.3 kg)    Body mass index is 54.24 kg/m.  NECK SIZE: 17.5 in.  MEDICATIONS  Patient self administered medications include: N/A. Medications administered during study include No sleep medicine administered.[name] Kitchen   SLEEP STUDY TECHNIQUE  The patient underwent an attended overnight polysomnography titration to assess the effects of cpap therapy. The following variables were monitored: EEG(C4-A1, C3-A2, O1-A2, O2-A1), EOG, submental and leg EMG, ECG, oxyhemoglobin saturation by pulse oximetry, thoracic and abdominal respiratory effort belts, nasal/oral airflow by pressure sensor, body position sensor and snoring sensor. CPAP pressure was titrated to eliminate apneas, hypopneas and oxygen desaturation.   TECHNICAL COMMENTS  Comments added by Technician: Patient had difficulty initiating sleep. Patient was restless all through the night. Comments added by Scorer: N/A   SLEEP ARCHITECTURE  The study was initiated at 11:04:35 PM and terminated at 5:22:24 AM. Total recorded time was 377.8 minutes. EEG confirmed total sleep time was 138.5 minutes yielding a sleep efficiency of 36.7%%. Sleep onset after lights out was 35.0 minutes with a REM latency of 327.5 minutes. The patient spent 7.9%% of the night in stage N1 sleep, 81.2%% in stage N2 sleep, 0.0%% in stage N3 and 10.8% in REM. The Arousal Index was 23.0/hour.  RESPIRATORY PARAMETERS  The overall AHI was 12.6 per hour, and the RDI was 21.2 events/hour with a central apnea index of 0.0per hour. The most  appropriate setting of CPAP was IPAP/EPAP 13/13 cm H2O. At this setting, the sleep efficiency was 98% and the patient was supine for 0%. The AHI was 2.8 events per hour, and the RDI was 2.8 events/hour (with 0.0 central events) and the arousal index was 2.8 per hour.The oxygen nadir was 92.0% during sleep.  LEG MOVEMENT DATA  The total leg movements were 0 with a resulting leg movement index of 0.0/hr. Associated arousal with leg movement index was 0.0/hr.   CARDIAC DATA  The underlying cardiac rhythm was most consistent with sinus rhythm. Mean heart rate during sleep was 85.6 bpm. Additional rhythm abnormalities include PVCs.   IMPRESSIONS  This is an optimal CPAP titration for Obstructive Sleep apnea. Central Sleep Apnea (CSA) did not develop Markedly reduced sleep efficiency.  DIAGNOSIS  Obstructive Sleep Apnea (327.23 [G47.33 ICD-10])  RECOMMENDATIONS  Trial of Auto adjusting CPAP therapy 4-16 cm H2O with a Medium size Resmed Full Face Mask AirFit 20 for Her mask and heated humidification.  Deretha Emory Sleep specialist, American Board of Internal Medicine  ELECTRONICALLY SIGNED ON:  03/19/2018, 5:01 PM [facility] PH: [phone]   FX: [phone] ACCREDITED BY THE AMERICAN ACADEMY OF SLEEP MEDICINE

## 2018-03-22 ENCOUNTER — Ambulatory Visit: Payer: 59 | Admitting: Rehabilitative and Restorative Service Providers"

## 2018-04-11 ENCOUNTER — Other Ambulatory Visit: Payer: Self-pay | Admitting: Family Medicine

## 2018-04-11 DIAGNOSIS — Z1231 Encounter for screening mammogram for malignant neoplasm of breast: Secondary | ICD-10-CM

## 2018-04-13 ENCOUNTER — Other Ambulatory Visit: Payer: Self-pay | Admitting: Chiropractor

## 2018-04-13 ENCOUNTER — Other Ambulatory Visit: Payer: Self-pay | Admitting: *Deleted

## 2018-04-13 ENCOUNTER — Ambulatory Visit
Admission: RE | Admit: 2018-04-13 | Discharge: 2018-04-13 | Disposition: A | Discharge status: Home / Self Care | Payer: 59 | Source: Ambulatory Visit | Attending: Chiropractor | Admitting: Chiropractor

## 2018-04-13 DIAGNOSIS — M545 Low back pain, unspecified: Secondary | ICD-10-CM

## 2018-04-13 DIAGNOSIS — G8929 Other chronic pain: Secondary | ICD-10-CM

## 2018-04-13 DIAGNOSIS — M869 Osteomyelitis, unspecified: Secondary | ICD-10-CM

## 2018-04-20 ENCOUNTER — Ambulatory Visit: Payer: 59 | Admitting: Rehabilitative and Restorative Service Providers"

## 2018-05-08 ENCOUNTER — Encounter: Payer: Self-pay | Admitting: Sports Medicine

## 2018-05-08 ENCOUNTER — Other Ambulatory Visit: Payer: Self-pay

## 2018-05-08 ENCOUNTER — Ambulatory Visit: Payer: 59 | Admitting: Rehabilitative and Restorative Service Providers"

## 2018-05-08 ENCOUNTER — Ambulatory Visit (INDEPENDENT_AMBULATORY_CARE_PROVIDER_SITE_OTHER): Payer: Medicare Other | Admitting: Sports Medicine

## 2018-05-08 VITALS — BP 173/61 | Temp 97.7°F | Ht 64.0 in | Wt 323.0 lb

## 2018-05-08 DIAGNOSIS — M25529 Pain in unspecified elbow: Secondary | ICD-10-CM

## 2018-05-08 NOTE — Progress Notes (Signed)
   Subjective:    Patient ID: [name] [name], female    DOB: 03-14-1950, 68 y.o.   MRN: [id number]  HPI chief complaint: Bilateral arm pain  [name] [name] comes in today complaining of two weeks of bilateral upper arm pain.  She does not recall any specific injury.  She describes an achy discomfort in the mid humerus which is most noticeable with activity such as cooking or reaching out to pick up heavy objects.  She denies any significant shoulder pain.  She denies any significant decrease in range of motion.  No numbness or tingling.  Pain is different than what she has felt in the past with her rotator cuff tendinopathy and arthritis.  She is wondering whether or not a cortisone injection may be helpful.  Past medical history reviewed Medications reviewed Allergies reviewed    Review of Systems    As above Objective:   Physical Exam  Well-developed, well-nourished.  No acute distress.  Awake alert and oriented x3.  Vital signs reviewed.  Examination of both shoulder show full painless range of motion.  No tenderness to palpation.  Negative empty can, negative Hawkins bilaterally.  Rotator cuff strength is 5/5 bilaterally and does not reproduce pain.  She is neurovascularly intact distally.      Assessment & Plan:   Bilateral upper arm pain  Upon reviewing her past medical history and current medications, I discovered that she was recently switched to Crestor for hypercholesterolemia.  I am suspicious that her symptoms may be secondary to her medication and I have asked her to please discuss this with her primary care physician, Dr. Laurann Montana.  The only way to confirm that would be to stop the medicine for about a month and see if her symptoms resolve but she is instructed not to do that without the guidance of Dr. Cliffton Asters.  She understands.  If her symptoms persist then we could consider a diagnostic subacromial cortisone injection but if her symptoms are from her medication then  injection today would be ineffective.  Of note, patient is also complaining of some left heel pain.  She has been given a silicone gel lift for her shoe but it is uncomfortable.  We will try a heel cup instead.  Follow-up as needed.

## 2018-05-19 ENCOUNTER — Ambulatory Visit: Payer: 59

## 2018-06-06 ENCOUNTER — Telehealth: Payer: Self-pay | Admitting: Adult Health

## 2018-06-06 NOTE — Telephone Encounter (Signed)
Pt is needing a call back from RN. She would like to know why she was prescribed clopidogrel (PLAVIX) 75 MG tablet Please advise

## 2018-06-06 NOTE — Telephone Encounter (Signed)
Revised. 

## 2018-06-06 NOTE — Telephone Encounter (Addendum)
I called pt about her questioning why is she taking plavix. Pt reported she has been having diarrhea for the past week. She does not know which medication is causing it. Pt stated her sports md was questioning why she is on  plavix for a TIA. I stated she was seen 11/2017 and January 2020 by Shanda Bumps NP and was advise to continue plavix for stroke prevention. I stated she had a right basal ganglia white matter infarct.    PT was constantly questioning her stroke and why she was on all these medications. She was complaining about her heart doctor that he is not communicating with her about her care. The heart MD is constantly prescribing her meds and she dont know what they are for. She goes to [employer] and look up health information. Pt stated no one never gave her a call about her lab work in January 2020. I stated Brittney CMA gave her a call on 03/06/2018 about her lab work and she verbalized understanding. Pt states she never recalls being call. I gave pt her lab work results again and she verbalized understanding. Pt stated she is looking for another heart MD. I reinforce pt to continue the  plavix to prevent another stroke. I advise her to call PCP for her diarrhea, and take all medications with food. Pt stated she does take her meds with food and is still having diarrhea.I was on the phone with pt for over 15 minutes. Pt verbalized understanding and will call her PCP about diarrhea.

## 2018-06-06 NOTE — Telephone Encounter (Signed)
Noted! Thank you

## 2018-06-09 ENCOUNTER — Ambulatory Visit: Payer: Self-pay | Admitting: Physical Therapy

## 2018-06-09 ENCOUNTER — Ambulatory Visit: Payer: Medicare Other | Admitting: Physical Therapy

## 2018-06-15 ENCOUNTER — Ambulatory Visit: Payer: Medicare Other | Admitting: Physical Therapy

## 2018-07-07 ENCOUNTER — Ambulatory Visit: Payer: Medicare Other

## 2018-07-11 ENCOUNTER — Ambulatory Visit: Payer: 59 | Attending: Sports Medicine | Admitting: Physical Therapy

## 2018-07-11 ENCOUNTER — Other Ambulatory Visit: Payer: Self-pay

## 2018-07-11 DIAGNOSIS — M545 Low back pain: Secondary | ICD-10-CM | POA: Insufficient documentation

## 2018-07-11 DIAGNOSIS — R2689 Other abnormalities of gait and mobility: Secondary | ICD-10-CM | POA: Insufficient documentation

## 2018-07-11 DIAGNOSIS — G8929 Other chronic pain: Secondary | ICD-10-CM | POA: Insufficient documentation

## 2018-07-11 DIAGNOSIS — M6281 Muscle weakness (generalized): Secondary | ICD-10-CM | POA: Insufficient documentation

## 2018-07-11 DIAGNOSIS — R29898 Other symptoms and signs involving the musculoskeletal system: Secondary | ICD-10-CM

## 2018-07-12 ENCOUNTER — Encounter: Payer: Self-pay | Admitting: Physical Therapy

## 2018-07-12 NOTE — Therapy (Signed)
[facility] [address] [place], [place], [address] Phone: [phone]   Fax:  [phone]  Physical Therapy Evaluation  Patient Details  Name: [name] HarborMyrtle [name] MRN: [id number] Date of Birth: 05-Feb-1951 Referring Provider (PT): Dr. Laurann Montanaynthia White    CLINIC OPERATION CHANGES: [facility] is operating at a low capacity due to COVID-19.  The patient was brought into the clinic for evaluation and/or treatment  following universal masking by staff, social distancing, and <10 people in the clinic.  The patient's COVID risk of complications score is 3.   Encounter Date: 07/11/2018  PT End of Session - 07/12/18 1453    Visit Number  1    PT Start Time  1300    PT Stop Time  1357    PT Time Calculation (min)  57 min    Activity Tolerance  Patient tolerated treatment well    Behavior During Therapy  WFL for tasks assessed/performed       Past Medical History:  Diagnosis Date  . Arthritis    "right knee" (07/02/2015)  . Borderline diabetes   . Hyperlipidemia   . Hypertension   . Overactive bladder   . S/P right knee arthroscopy    with septic joint growing out MSSA  . Septic arthritis of knee, right (HCC)   . Wears glasses     Past Surgical History:  Procedure Laterality Date  . COLONOSCOPY    . DIAGNOSTIC LAPAROSCOPY    . JOINT REPLACEMENT    . KNEE ARTHROSCOPY Right 12/09/2012   Procedure: ARTHROSCOPIC IRRIGATION AND DEBRIDEMENT RIGHT KNEE;  Surgeon: Budd PalmerMichael H Handy, MD;  Location: [facility] OR;  Service: Orthopedics;  Laterality: Right;  . LEFT HEART CATH AND CORONARY ANGIOGRAPHY N/A 09/02/2016   Procedure: Left Heart Cath and Coronary Angiography;  Surgeon: Rinaldo CloudHarwani, Mohan, MD;  [facility];  Service: Cardiovascular;  Laterality: N/A;  . 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 TOTAL KNEE ARTHROPLASTY Bilateral 2011-2012   left-right  . TOTAL KNEE REVISION Right 11/06/2014   Procedure: REMOVAL OF TOTAL COMPONENTS, EXTENSIVE  IRRIGATION AND DEBRIDEMENT  RIGHT KNEE;  Surgeon: Loreta Aveaniel F Murphy, MD;  [facility];  Service: Orthopedics;  Laterality: Right;  . TOTAL KNEE REVISION Right 07/02/2015  . TOTAL KNEE REVISION Right 07/02/2015   Procedure: TOTAL KNEE REVISION;  Surgeon: Loreta Aveaniel F Murphy, MD;  [facility];  Service: Orthopedics;  Laterality: Right;  . TUBAL LIGATION  ~ 1980    There were no vitals filed for this visit.   Subjective Assessment - 07/12/18 1444    Subjective  Pt presents for scooter eval - states she only wants a scooter - does not want a power wheelchair;  pt using clinic's manual wheelchair and used SPC from car to clinic lobby; Harrie JeansBrandon Sisk, ATP with [employer] present for eval    Pertinent History  s/p bil. 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  Yes    Pain Score  5     Pain Location  Knee    Pain Orientation  Right    Pain Descriptors / Indicators  Aching    Pain Type  Chronic pain    Pain Onset  More than a month ago    Pain Frequency  Constant    Aggravating Factors   weight bearing increases pain significantly         [facility] PT Assessment - 07/12/18 0001      Assessment   Medical Diagnosis  Low back pain:  s/p bil. TKR with s/p Rt knee revisions x 2 (Sept. 2016 & May 2017) due to infection of knee prosthesis    Referring Provider (PT)  Dr. Laurann Montana    Onset Date/Surgical Date  --   2010     Precautions   Precautions  Fall      Restrictions   Weight Bearing Restrictions  No      Balance Screen   Has the patient fallen in the past 6 months  No    Has the patient had a decrease in activity level  because of a fear of falling?   No    Is the patient reluctant to leave their home because of a fear of falling?   No      Home Environment   Living Environment  Private residence                Objective measurements completed on examination: See above findings.          Eval for motorized scooter completed with Harrie Jeans, ATP with [employer]                  Plan - 07/12/18 1454    Clinical Impression Statement  Pt presents with gait abnormality due to c/o LBP and Rt knee pain; pt is at high risk for fall due to pain and Rt knee joint dysfunction.  Pt requests motorized scooter (not a power wheelchair) for independence and safety with mobility in home and community.      Personal Factors and Comorbidities  Fitness;Comorbidity 2;Time since onset of injury/illness/exacerbation    Examination-Activity Limitations  Transfers;Locomotion Level;Stand;Stairs    Examination-Participation Restrictions  Church;Meal Prep;Community Activity;Shop    Stability/Clinical Decision Making  Stable/Uncomplicated    Clinical Decision Making  Low    PT Frequency  One time visit   wheelchair eval only   PT Treatment/Interventions  Other (comment)   wheelchair management   Recommended Other Services  obtain Scooter from [employer], ATP present for eval    Consulted and Agree with Plan of Care  Patient       Patient will benefit from skilled therapeutic intervention in order to improve the following deficits and impairments:  Abnormal gait, Pain, Decreased balance, Decreased strength  Visit Diagnosis: Chronic bilateral low back pain without sciatica - Plan: PT plan of care cert/re-cert  Other symptoms and signs involving the musculoskeletal system - Plan: PT plan of care cert/re-cert  Other abnormalities of gait and mobility - Plan: PT plan of care cert/re-cert  Muscle weakness (generalized) - Plan: PT plan of care cert/re-cert     Problem List Patient  Active Problem List   Diagnosis Date Noted  . Stroke (HCC) 10/06/2017  . Acute CVA (cerebrovascular accident) (HCC) 10/06/2017  . Trigger thumb, right thumb 05/03/2016  . 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 Protein-calorie malnutrition (HCC) 11/15/2014  . Osteomyelitis of right knee region (HCC) 11/08/2014  . Complication of device 11/04/2014  . Septic arthritis (HCC) 11/04/2014  . S/P right knee arthroscopy   . UTI (urinary tract infection) 12/09/2012  . 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 Hyperlipidemia   . Hypertension     Katheren Jimmerson, Donavan Burnet, PT 07/12/2018, 3:04 PM  [facility] [address] [place], [place], [address] Phone: [phone]   Fax:  [phone]  Name: Vyonne Narro MRN: [id number] Date of Birth: 17-Nov-1950

## 2018-08-23 ENCOUNTER — Ambulatory Visit: Payer: Medicare Other

## 2018-08-31 ENCOUNTER — Telehealth: Payer: Self-pay | Admitting: Adult Health

## 2018-08-31 NOTE — Telephone Encounter (Signed)
I called patient regarding her 7/20 appointment. I advised patient that [facility] is currently only doing virtual visits on Monday mornings, and I asked patient if she would like to have the visit virtually. Patient gave consent, and states that she does not have MyChart set up but can use doxy. I have sent patient an e-mail with the doxy link and directions, as well as office contact info if she has any concerns.  Pt understands that although there may be some limitations with this type of visit, we will take all precautions to reduce any security or privacy concerns.  Pt understands that this will be treated like an in office visit and we will file with pt's insurance, and there may be a patient responsible charge related to this service.

## 2018-09-04 ENCOUNTER — Telehealth: Payer: Self-pay | Admitting: Adult Health

## 2018-09-04 ENCOUNTER — Ambulatory Visit: Payer: 59 | Admitting: Adult Health

## 2018-09-04 NOTE — Telephone Encounter (Signed)
Gave to Jael, to call and schedule pt.

## 2018-09-04 NOTE — Telephone Encounter (Signed)
Patient scheduled for follow-up telemedicine visit via [email or web address] today at 10:45 AM.  She unfortunately did not show for visit and will need to be rescheduled at this time.  If she agrees, patient should be rescheduled for in office follow-up visit.

## 2018-09-04 NOTE — Progress Notes (Deleted)
[facility] [address] [place]. [place] [id number] [phone]       FOLLOW UP NOTE  [name] [name] Date of Birth:  [birth year] Medical Record Number:  [id number]   Reason for visit: Right basal ganglia infarct follow-up visit  Virtual Visit via Video Note  I connected with [name] [name] on 09/04/18 at 10:45 AM EDT by a video enabled telemedicine application and verified that I am speaking with the correct person using two identifiers.  Location: Patient: *** Provider: ***   I discussed the limitations of evaluation and management by telemedicine and the availability of in person appointments. The patient expressed understanding and agreed to proceed.    CHIEF COMPLAINT:  No chief complaint on file.   HPI:  09/04/2018 virtual visit [name] [name] is a 68 year old who is being seen today for stroke follow-up which occurred in 09/2017.  She continues to be stable from a neurological standpoint without residual deficits.     history 03/03/2018: Patient is being seen today for 596-month follow-up visit and is accompanied by her granddaughter.  Overall, she has been stable from a stroke stand point without residual deficits.  She has been participating in water aerobics for knee and back pain. She did have sleep study which showed severe oxygen desaturation and moderate obstructive sleep apnea.  Recommended CPAP titration or auto adjusting CPAP. She does have to go back for a CPAP titration 03/14/18. She has been having a difficult time with her medications due to side effects of leg pain, back pain and numbness. She states when she doesn't take any of her medications, she does not experience these symptoms.  She also has stopped all of her blood pressure medications and has been substituting antihypertensives with CBD oil and does feel as though she has had improvement of her blood pressure.  Blood pressure today 178/81 and she states this is due to her walking into  the office.  She does continue to take Plavix intermittently without side effects of bleeding or bruising.  She does continue to take atorvastatin intermittently and questioning whether she is experiencing statin side effects.  She also endorses infrequent headaches with pressure behind her right eye and pressure on the top of her head.  She denies photophobia, phonophobia or nausea/vomiting.  She denies history of headaches or migraines.  She does endorse her granddaughter has history of migraines.  She denies these headaches to be debilitating and will typically resolve on their own.  These headaches occur possibly once per month.  These have been present for the past year.  No further concerns at this time.  Denies new or worsening stroke/TIA symptoms.   11/24/2017 visit: Patient is being seen today for hospital follow-up and is accompanied by her granddaughter.  [facility] and then was discharged home without therapy needs.  She is currently living with her daughter and grandchildren as she was unable to return to her home due to black mold.  She does plan on obtaining her own place soon to live independently.  She does continue to have residual left fine motor control weakness but this has been improving.  Denies any other residual deficits or recurring of symptoms.  She is currently sitting in wheelchair as she uses this for long distance due to increased shortness of breath with ambulation along with right knee pain which is chronic.  Patient will use a cane for short distance.  She has completed 3 weeks DAPT  and currently on Plavix alone without side effects of bleeding or bruising.  She continues on atorvastatin 80 mg daily without side effects of myalgias.  Blood pressure today 161/76 and as patient does monitor this at home, SBP typically ranges from 1 50-1 60.  She states she was previously on antihypertensives prior to admission but her blood pressure continued to be at  this range despite use of antihypertensives.  She will be seeing a new PCP on 12/13/2017 and will speak to her regarding HTN management.  She also plans on speaking to her regarding OSA referral and work-up.  No further concerns at this time and denies new or worsening stroke/TIA symptoms.   Hospital admission 10/06/2017: Ms. [name] HarborMyrtle Bottcher is a 68 y.o. female with history of HTN, HLD, borderline DB,  who presented with L HP that started on Sunday, developed CP.  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  MRA showed narrowing of the right VA V4.  Carotid Doppler showed bilateral ICA stenosis of 1 to 39%.  2D echo showed an EF of 55 to 60% without cardiac source of embolus identified.  LDL 114 and is recommended to increase Lipitor to 80 mg daily.  HTN stable during admission and recommended long-term BP goal normotensive range.  A1c 6.2 and recommended continued follow-up and management for DM with PCP.  Previously on aspirin 81 mg and recommended DAPT for 3 weeks and Plavix alone.  It was determined that the stroke was likely secondary to small vessel disease due to multiple vascular risk factors including morbid obesity, diabetes, hypertension and hyperlipidemia.  Was also recommended for patient to undergo sleep apnea work-up outpatient.  PT/OT recommended SNF placement and was discharged in [facility] condition [facility].      ROS:   14 system review of systems performed and negative with exception of back pain, walking difficulty, joint pain,  PMH:  Past Medical History:  Diagnosis Date   Arthritis    "right knee" (07/02/2015)   Borderline diabetes    Hyperlipidemia    Hypertension    Overactive bladder    S/P right knee arthroscopy    with septic joint growing out MSSA   Septic arthritis of  knee, right (HCC)    Wears glasses     PSH:  Past Surgical History:  Procedure Laterality Date   COLONOSCOPY     DIAGNOSTIC LAPAROSCOPY     JOINT REPLACEMENT     KNEE ARTHROSCOPY Right 12/09/2012   Procedure: ARTHROSCOPIC IRRIGATION AND DEBRIDEMENT RIGHT KNEE;  Surgeon: Budd PalmerMichael H Handy, MD;  Location: [facility] OR;  Service: Orthopedics;  Laterality: Right;   LEFT HEART CATH AND CORONARY ANGIOGRAPHY N/A 09/02/2016   Procedure: Left Heart Cath and Coronary Angiography;  Surgeon: Rinaldo CloudHarwani, Mohan, MD;  [facility];  Service: Cardiovascular;  Laterality: N/A;   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/06/2014   Procedure: PLACEMENT OF CEMENT SPACER;  Surgeon: Loreta Aveaniel F Murphy, MD;  [facility];  Service: Orthopedics;  Laterality: Right;   TOTAL KNEE ARTHROPLASTY Bilateral 2011-2012   left-right   TOTAL KNEE REVISION Right 11/06/2014   Procedure: REMOVAL OF TOTAL COMPONENTS, EXTENSIVE  IRRIGATION AND DEBRIDEMENT  RIGHT KNEE;  Surgeon: Loreta Aveaniel F Murphy, MD;  Location: [facility] OR;  Service: Orthopedics;  Laterality: Right;   TOTAL KNEE REVISION Right 07/02/2015  TOTAL KNEE REVISION Right 07/02/2015   Procedure: TOTAL KNEE REVISION;  Surgeon: Loreta Aveaniel F Murphy, MD;  [facility];  Service: Orthopedics;  Laterality: Right;   TUBAL LIGATION  ~ 1980    Social History:  Social History   Socioeconomic History   Marital status: Divorced    Spouse name: Not on file   Number of children: Not on file   Years of education: Not on file   Highest education level: Not on file  Occupational History   Not on file  Social Needs   Financial resource strain: Not on file   Food insecurity    Worry: Not on file    Inability: Not on file   Transportation needs    Medical: Not on file    Non-medical: Not on file  Tobacco Use   Smoking status: Former Smoker    Packs/day: 0.25    Years: 23.00    Pack years: 5.75    Types: Cigarettes    Quit date: 08/01/1992    Years  since quitting: 26.1   Smokeless tobacco: Never Used  Substance and Sexual Activity   Alcohol use: No   Drug use: No   Sexual activity: Never  Lifestyle   Physical activity    Days per week: Not on file    Minutes per session: Not on file   Stress: Not on file  Relationships   Social connections    Talks on phone: Not on file    Gets together: Not on file    Attends religious service: Not on file    Active member of club or organization: Not on file    Attends meetings of clubs or organizations: Not on file    Relationship status: Not on file   Intimate partner violence    Fear of current or ex partner: Not on file    Emotionally abused: Not on file    Physically abused: Not on file    Forced sexual activity: Not on file  Other Topics Concern   Not on file  Social History Narrative   Not on file    Family History:  Family History  Problem Relation Age of Onset   Cancer - Other Mother    Hypertension Other     Medications:   Current Outpatient Medications on File Prior to Visit  Medication Sig Dispense Refill   amoxicillin (AMOXIL) 500 MG capsule      chlorthalidone (HYGROTON) 25 MG tablet Take 25 mg by mouth every morning.  0   clopidogrel (PLAVIX) 75 MG tablet Take 1 tablet (75 mg total) by mouth daily. 30 tablet 0   methocarbamol (ROBAXIN) 500 MG tablet TAKE 1 TABLET BY MOUTH TWICE DAILY BETWEEN MEALS AS NEEDED FOR MUSCLE SPASMS  3   MYRBETRIQ 50 MG TB24 tablet Take 50 mg by mouth daily.  11   nitroGLYCERIN (NITROSTAT) 0.4 MG SL tablet Place 0.4 mg under the tongue every 5 (five) minutes x 3 doses as needed. For chest pain.  3   omeprazole (PRILOSEC) 20 MG capsule TAKE ONE CAPSULE BY MOUTH EVERY MORNING 30 MINUTES BEFORE BREAKFAST  3   rosuvastatin (CRESTOR) 20 MG tablet Take 1 tablet (20 mg total) by mouth daily. 30 tablet 3   trimethoprim (TRIMPEX) 100 MG tablet Take 100 mg by mouth daily.  11   No current facility-administered medications on  file prior to visit.     Allergies:  No Known Allergies   Physical Exam  There were  no vitals filed for this visit. There is no height or weight on file to calculate BMI. No exam data present  General: Morbidly obese pleasant middle-aged African-American female, seated, in no evident distress Head: head normocephalic and atraumatic.   Neck: supple with no carotid or supraclavicular bruits Cardiovascular: regular rate and rhythm, no murmurs; +3 pitting edema bilateral lower extremities Musculoskeletal: no deformity Skin:  no rash/petichiae Vascular:  Normal pulses all extremities  Neurologic Exam Mental Status: Awake and fully alert. Oriented to place and time. Recent and remote memory intact. Attention span, concentration and fund of knowledge appropriate. Mood and affect appropriate.  Cranial Nerves: Fundoscopic exam reveals sharp disc margins. Pupils equal, briskly reactive to light. Extraocular movements full without nystagmus. Visual fields full to confrontation. Hearing intact. Facial sensation intact. Face, tongue, palate moves normally and symmetrically.  Motor: Normal bulk and tone. Normal strength in all tested extremity muscles. Sensory.: intact to touch , pinprick , position and vibratory sensation.  Coordination: Rapid alternating movements normal in all extremities. Finger-to-nose and heel-to-shin performed accurately bilaterally.  Diminished left hand dexterity Gait and Station: Patient is currently sitting in wheelchair with cane present.  Patient ambulates with a hunched stance, short steps, with a waddle gait and assistance of cane Reflexes: 1+ and symmetric. Toes downgoing.    NIHSS  0 Modified Rankin  2    Diagnostic Data (Labs, Imaging, Testing)  SLEEP STUDY 01/19/2018 [facility] IMPRESSIONS   Severe Oxygen Desaturation   Moderate Obstructive Sleep apnea(OSA)  DIAGNOSIS   Obstructive Sleep Apnea (327.23 [G47.33  ICD-10])  RECOMMENDATIONS   Therapeutic CPAP titration or auto-adjusting CPAP is recommended due to severity of OSA in REM sleep     ASSESSMENT: Chihiro Frey is a 68 y.o. year old female here with left basal ganglia infarct on 10/06/2017 secondary to small vessel disease. Vascular risk factors include morbid obesity, HTN, HLD and DM.  Patient recently underwent sleep study with diagnosis of moderate OSA with initiation of CPAP.  Patient returns today for follow-up visit and has been stable from a stroke standpoint without residual deficit or recurring of symptoms.  She does endorse medication noncompliance due to potential side effects.    PLAN: -Continue clopidogrel 75 mg daily for secondary stroke prevention -Advised her to discontinue atorvastatin due to potential statin side effects.  Lipid panel obtained at today's appointment and did advise her that depending on her levels, additional statin may be started -F/u with PCP regarding your HLD, DM and HTN management -highly encouraged to schedule follow-up appointment as she has not been seen in over 1 years time. -Will obtain A1c and CBC/CMP per patient's request -f/u with sleep center for OSA management and initiation of CPAP -Educated patient on importance of taking all prescribed medications as recommended.  Advised her that if she experiences possible side effects from her medications, to notify her providers for possible change in management. -Advised to continue to stay active as tolerated with a healthy diet -Highly encouraged weight loss -continue to monitor BP at home -Maintain strict control of hypertension with blood pressure goal below 130/90, diabetes with hemoglobin A1c goal below 6.5% and cholesterol with LDL cholesterol (bad cholesterol) goal below 70 mg/dL. I also advised the patient to eat a healthy diet with plenty of whole grains, cereals, fruits and vegetables, exercise regularly and maintain ideal body  weight.  Follow up in 6 months or call earlier if needed   Greater than 50% of time during this 25  minute visit was spent on counseling,explanation of diagnosis of left BG infarct, reviewing risk factor management/monitoring of HLD, HTN, DM and morbid obesity, planning of further management, importance of medication compliance, discussion with patient and family and coordination of care    George Hugh, AGNP-BC  [facility] [address] [place], [place] [address]  Phone [phone] Fax [phone] Note: This document was prepared with digital dictation and possible smart phrase technology. Any transcriptional errors that result from this process are unintentional.

## 2018-10-04 ENCOUNTER — Ambulatory Visit
Admission: RE | Admit: 2018-10-04 | Discharge: 2018-10-04 | Disposition: A | Discharge status: Home / Self Care | Payer: 59 | Source: Ambulatory Visit | Attending: Family Medicine | Admitting: Family Medicine

## 2018-10-04 ENCOUNTER — Other Ambulatory Visit: Payer: Self-pay

## 2018-10-04 DIAGNOSIS — Z1231 Encounter for screening mammogram for malignant neoplasm of breast: Secondary | ICD-10-CM

## 2018-10-17 ENCOUNTER — Other Ambulatory Visit: Payer: Self-pay

## 2018-10-17 ENCOUNTER — Ambulatory Visit (INDEPENDENT_AMBULATORY_CARE_PROVIDER_SITE_OTHER): Payer: 59 | Admitting: Sports Medicine

## 2018-10-17 VITALS — BP 185/66 | Ht 64.0 in | Wt 323.0 lb

## 2018-10-17 DIAGNOSIS — M25512 Pain in left shoulder: Secondary | ICD-10-CM | POA: Diagnosis not present

## 2018-10-17 MED ORDER — METHYLPREDNISOLONE ACETATE 40 MG/ML IJ SUSP
40.0000 mg | Freq: Once | INTRAMUSCULAR | Status: AC
  Administered 2018-10-17: 16:00:00 40 mg via INTRA_ARTICULAR

## 2018-10-18 NOTE — Progress Notes (Signed)
   Subjective:    Patient ID: [name] [name], female    DOB: February 14, 1951, 68 y.o.   MRN: [id number]  HPI chief complaint: Left arm and shoulder pain  Patient comes in today after having injured her left arm/shoulder this past weekend.  She was picking up her grandbaby when she experienced severe acute pain.  Pain is diffuse throughout the shoulder and does radiate to the elbow.  It is most noticeable with any sort of shoulder motion.  She has had similar episodes in the past with her right shoulder which responded well to cortisone injection.  She is requesting a cortisone injection today.  Interim medical history reviewed Medications reviewed Allergies reviewed   Review of Systems As above    Objective:   Physical Exam  Well-developed, well-nourished.  No acute distress.  Awake alert and oriented x3.  Vital signs reviewed  Left shoulder: No gross deformity.  Limited range of motion in all planes secondary to pain.  Global weakness secondary to pain.  Neurovascular intact distally.      Assessment & Plan:   Left shoulder pain likely secondary to rotator cuff strain  Left subacromial space is injected with cortisone today.  Sling was provided with instructions to wear for comfort only.  If symptoms persist, consider diagnostic imaging.  Follow-up for ongoing or recalcitrant issues.  Consent obtained and verified. Time-out conducted. Noted no overlying erythema, induration, or other signs of local infection. Skin prepped in a sterile fashion. Topical analgesic spray: Ethyl chloride. Joint: left shoulder (subacromial) Needle: 25g 1.5 inch Completed without difficulty. Meds: 3cc 1% xylocaine, 1cc (40mg ) depomedrol  Advised to call if fevers/chills, erythema, induration, drainage, or persistent bleeding.

## 2018-10-26 ENCOUNTER — Ambulatory Visit: Payer: 59 | Admitting: Sports Medicine

## 2018-11-09 ENCOUNTER — Ambulatory Visit
Admission: RE | Admit: 2018-11-09 | Discharge: 2018-11-09 | Disposition: A | Discharge status: Home / Self Care | Payer: 59 | Source: Ambulatory Visit | Attending: Sports Medicine | Admitting: Sports Medicine

## 2018-11-09 ENCOUNTER — Other Ambulatory Visit: Payer: Self-pay

## 2018-11-09 ENCOUNTER — Ambulatory Visit (INDEPENDENT_AMBULATORY_CARE_PROVIDER_SITE_OTHER): Payer: 59 | Admitting: Sports Medicine

## 2018-11-09 VITALS — BP 191/71 | Ht 64.0 in | Wt 323.0 lb

## 2018-11-09 DIAGNOSIS — M25512 Pain in left shoulder: Secondary | ICD-10-CM | POA: Diagnosis not present

## 2018-11-09 NOTE — Progress Notes (Signed)
   Subjective:    Patient ID: [name] [name], female    DOB: 08-Nov-1950, 68 y.o.   MRN: [id number]  HPI   Patient comes in today with persistent left shoulder pain.  Recent subacromial cortisone injection provided her with minimal symptom relief.  Pain is primarily along the anterior superior aspect of the shoulder.  Worse with reaching around behind her head.  Also bad at night when trying to sleep.  No radiating pain down the arm.  No numbness or tingling.   Review of Systems As above    Objective:   Physical Exam  Well-developed, well-nourished.  No acute distress.  Awake alert and oriented x3.  Vital signs reviewed  Left shoulder: Patient has good range of motion but does have a positive painful arc.  She is slightly tender to palpation over the acromioclavicular joint.  No tenderness over the bicipital groove.  Positive empty can, positive Hawkins.  Rotator cuff strength is 5/5 bilaterally.  Negative O'Brien's.  Neurovascularly intact distally.      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  Phone follow-up with those results when available later this afternoon.  I may have the patient return to the office after that for an ultrasound-guided [facility] joint injection.  Addendum: X-rays reviewed.  Patient does have moderately advanced AC joint DJD as well as moderate glenohumeral DJD.  I recommended that the patient return to the office in the near future for a diagnostic/therapeutic ultrasound-guided [facility] joint injection.

## 2018-11-14 ENCOUNTER — Other Ambulatory Visit: Payer: Self-pay

## 2018-11-14 ENCOUNTER — Ambulatory Visit (INDEPENDENT_AMBULATORY_CARE_PROVIDER_SITE_OTHER): Payer: 59 | Admitting: Sports Medicine

## 2018-11-14 VITALS — BP 193/65 | Ht 64.0 in | Wt 323.0 lb

## 2018-11-14 DIAGNOSIS — M25512 Pain in left shoulder: Secondary | ICD-10-CM

## 2018-11-14 DIAGNOSIS — M19019 Primary osteoarthritis, unspecified shoulder: Secondary | ICD-10-CM

## 2018-11-14 MED ORDER — METHYLPREDNISOLONE ACETATE 40 MG/ML IJ SUSP
40.0000 mg | Freq: Once | INTRAMUSCULAR | Status: AC
  Administered 2018-11-14: 40 mg via INTRA_ARTICULAR

## 2018-11-14 NOTE — Progress Notes (Signed)
   [facility] [address] [place], [place] [address] Phone: [phone] Fax: [phone]   Patient Name: [name] [name] Date of Birth: Apr 14, 1950 Medical Record Number: [id number] Gender: female Date of Encounter: 11/14/2018  SUBJECTIVE:      Chief Complaint:  Left AC joint injection   HPI:  Patient presents today for left [facility] joint injection.   OBJECTIVE:  BP (!) 193/65   Ht 5\' 4"  (1.626 m)   Wt (!) 323 lb (146.5 kg)   BMI 55.44 kg/m  Physical Exam:  Vital signs are reviewed.   GEN: Alert and oriented, NAD Pulm: Breathing unlabored PSY: normal mood, congruent affect  Procedure: After informed written consent, timeout was performed, patient was seated on exam table.  Left shoulder prepped with iodine and alcohol swab after ultrasound used to identify AC joint.  Left AC joint then injected with 1:17mL bupivicaine:depomedrol under US guidance.  Tolerated the procedure well without immediate complications.  ASSESSMENT & PLAN:   1. Chronic left AC arthritis  Successful injection as above.  Patient tolerated procedure well.  Patient will follow-up as needed for left AC joint pain.   Lanier Clam, DO, ATC Sports Medicine Fellow  Patient seen and evaluated with the sports medicine fellow.  I agree with the above plan of care.  Ultrasound-guided cortisone injection of the left acromioclavicular joint was successful without complication. Follow-up as needed.

## 2018-11-27 ENCOUNTER — Ambulatory Visit: Payer: 59 | Admitting: Adult Health

## 2018-11-27 ENCOUNTER — Telehealth: Payer: Self-pay

## 2018-11-27 NOTE — Progress Notes (Deleted)
[facility] [address] [place]. [place] [address] [phone]       OFFICE FOLLOW UP NOTE  [name] Date of Birth:  [birth year] Medical Record Number:  [id number]   Reason for visit: Right basal ganglia infarct follow-up visit  CHIEF COMPLAINT:  No chief complaint on file.   HPI:  Update 11/27/2018: [name] [name] is being seen today for stroke follow-up.  She has been stable from a stroke standpoint without reoccurring or new stroke/TIA symptoms.  She does not have residual deficits.  Lipid panel obtained at prior visit with LDL 116 and due to potential atorvastatin side effects, recommended to initiate Crestor for HLD management and secondary stroke prevention.  At prior visit, she endorses self discontinuing all medications due to patient reported side effects.  Recommended restarting Plavix for secondary stroke prevention.  Blood pressure ***.  Continues to follow with pain medicine for ongoing chronic pain syndrome.  No further concerns at this time.   Interval history 03/03/2018: Patient is being seen today for 20-month follow-up visit and is accompanied by her granddaughter.  Overall, she has been stable from a stroke stand point without residual deficits.  She has been participating in water aerobics for knee and back pain. She did have sleep study which showed severe oxygen desaturation and moderate obstructive sleep apnea.  Recommended CPAP titration or auto adjusting CPAP. She does have to go back for a CPAP titration 03/14/18. She has been having a difficult time with her medications due to side effects of leg pain, back pain and numbness. She states when she doesn't take any of her medications, she does not experience these symptoms.  She also has stopped all of her blood pressure medications and has been substituting antihypertensives with CBD oil and does feel as though she has had improvement of her blood pressure.  Blood pressure today 178/81 and she  states this is due to her walking into the office.  She does continue to take Plavix intermittently without side effects of bleeding or bruising.  She does continue to take atorvastatin intermittently and questioning whether she is experiencing statin side effects.  She also endorses infrequent headaches with pressure behind her right eye and pressure on the top of her head.  She denies photophobia, phonophobia or nausea/vomiting.  She denies history of headaches or migraines.  She does endorse her granddaughter has history of migraines.  She denies these headaches to be debilitating and will typically resolve on their own.  These headaches occur possibly once per month.  These have been present for the past year.  No further concerns at this time.  Denies new or worsening stroke/TIA symptoms.   11/24/2017 visit: Patient is being seen today for hospital follow-up and is accompanied by her granddaughter.  She resided at [facility] for 1 week and then was discharged home without therapy needs.  She is currently living with her daughter and grandchildren as she was unable to return to her home due to black mold.  She does plan on obtaining her own place soon to live independently.  She does continue to have residual left fine motor control weakness but this has been improving.  Denies any other residual deficits or recurring of symptoms.  She is currently sitting in wheelchair as she uses this for long distance due to increased shortness of breath with ambulation along with right knee pain which is chronic.  Patient will use a cane for short distance.  She has completed  3 weeks DAPT and currently on Plavix alone without side effects of bleeding or bruising.  She continues on atorvastatin 80 mg daily without side effects of myalgias.  Blood pressure today 161/76 and as patient does monitor this at home, SBP typically ranges from 1 50-1 60.  She states she was previously on antihypertensives prior to admission  but her blood pressure continued to be at this range despite use of antihypertensives.  She will be seeing a new PCP on 12/13/2017 and will speak to her regarding HTN management.  She also plans on speaking to her regarding OSA referral and work-up.  No further concerns at this time and denies new or worsening stroke/TIA symptoms.   Hospital admission 10/06/2017: [name] [name] is a 68 y.o. female with history of HTN, HLD, borderline DB,  who presented with L HP that started on Sunday, developed CP.  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  MRA showed narrowing of the right VA V4.  Carotid Doppler showed bilateral ICA stenosis of 1 to 39%.  2D echo showed an EF of 55 to 60% without cardiac source of embolus identified.  LDL 114 and is recommended to increase Lipitor to 80 mg daily.  HTN stable during admission and recommended long-term BP goal normotensive range.  A1c 6.2 and recommended continued follow-up and management for DM with PCP.  Previously on aspirin 81 mg and recommended DAPT for 3 weeks and Plavix alone.  It was determined that the stroke was likely secondary to small vessel disease due to multiple vascular risk factors including morbid obesity, diabetes, hypertension and hyperlipidemia.  Was also recommended for patient to undergo sleep apnea work-up outpatient.  PT/OT recommended SNF placement and was discharged in stable condition to [facility].      ROS:   14 system review of systems performed and negative with exception of back pain, walking difficulty, joint pain,  PMH:  Past Medical History:  Diagnosis Date   Arthritis    "right knee" (07/02/2015)   Borderline diabetes    Hyperlipidemia    Hypertension    Overactive bladder    S/P right knee arthroscopy    with septic  joint growing out MSSA   Septic arthritis of knee, right (HCC)    Wears glasses     PSH:  Past Surgical History:  Procedure Laterality Date   COLONOSCOPY     DIAGNOSTIC LAPAROSCOPY     JOINT REPLACEMENT     KNEE ARTHROSCOPY Right 12/09/2012   Procedure: ARTHROSCOPIC IRRIGATION AND DEBRIDEMENT RIGHT KNEE;  Surgeon: Rozanna Box, MD;  Location: [facility];  Service: Orthopedics;  Laterality: Right;   LEFT HEART CATH AND CORONARY ANGIOGRAPHY N/A 09/02/2016   Procedure: Left Heart Cath and Coronary Angiography;  Surgeon: Charolette Forward, MD;  Location: [facility];  Service: Cardiovascular;  Laterality: N/A;   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/06/2014   Procedure: PLACEMENT OF CEMENT SPACER;  Surgeon: Ninetta Lights, MD;  Location: [facility];  Service: Orthopedics;  Laterality: Right;   TOTAL KNEE ARTHROPLASTY Bilateral 2011-2012   left-right   TOTAL KNEE REVISION Right 11/06/2014   Procedure: REMOVAL OF TOTAL COMPONENTS, EXTENSIVE  IRRIGATION AND DEBRIDEMENT  RIGHT KNEE;  Surgeon: Ninetta Lights, MD;  Location: [facility];  Service: Orthopedics;  Laterality: Right;   TOTAL KNEE  REVISION Right 07/02/2015   TOTAL KNEE REVISION Right 07/02/2015   Procedure: TOTAL KNEE REVISION;  Surgeon: Loreta Ave, MD;  [facility];  Service: Orthopedics;  Laterality: Right;   TUBAL LIGATION  ~ 1980    Social History:  Social History   Socioeconomic History   Marital status: Divorced    Spouse name: Not on file   Number of children: Not on file   Years of education: Not on file   Highest education level: Not on file  Occupational History   Not on file  Social Needs   Financial resource strain: Not on file   Food insecurity    Worry: Not on file    Inability: Not on file   Transportation needs    Medical: Not on file    Non-medical: Not on file  Tobacco Use   Smoking status: Former Smoker    Packs/day: 0.25    Years: 23.00    Pack years: 5.75    Types:  Cigarettes    Quit date: 08/01/1992    Years since quitting: 26.3   Smokeless tobacco: Never Used  Substance and Sexual Activity   Alcohol use: No   Drug use: No   Sexual activity: Never  Lifestyle   Physical activity    Days per week: Not on file    Minutes per session: Not on file   Stress: Not on file  Relationships   Social connections    Talks on phone: Not on file    Gets together: Not on file    Attends religious service: Not on file    Active member of club or organization: Not on file    Attends meetings of clubs or organizations: Not on file    Relationship status: Not on file   Intimate partner violence    Fear of current or ex partner: Not on file    Emotionally abused: Not on file    Physically abused: Not on file    Forced sexual activity: Not on file  Other Topics Concern   Not on file  Social History Narrative   Not on file    Family History:  Family History  Problem Relation Age of Onset   Cancer - Other Mother    Hypertension Other     Medications:   Current Outpatient Medications on File Prior to Visit  Medication Sig Dispense Refill   amoxicillin (AMOXIL) 500 MG capsule      BYSTOLIC 10 MG tablet TAKE ONE TABLET BY MOUTH ONCE DAILY FOR 30 DAYS     clopidogrel (PLAVIX) 75 MG tablet Take 1 tablet (75 mg total) by mouth daily. 30 tablet 0   losartan (COZAAR) 25 MG tablet TAKE ONE TABLET BY MOUTH ONCE DAILY FOR 30 DAYS     methocarbamol (ROBAXIN) 500 MG tablet TAKE 1 TABLET BY MOUTH TWICE DAILY BETWEEN MEALS AS NEEDED FOR MUSCLE SPASMS  3   MYRBETRIQ 50 MG TB24 tablet Take 50 mg by mouth daily.  11   nitroGLYCERIN (NITROSTAT) 0.4 MG SL tablet Place 0.4 mg under the tongue every 5 (five) minutes x 3 doses as needed. For chest pain.  3   pravastatin (PRAVACHOL) 20 MG tablet TAKE ONE TABLET BY MOUTH ONCE DAILY FOR 30 DAYS     No current facility-administered medications on file prior to visit.     Allergies:  No Known  Allergies   Physical Exam  There were no vitals filed for this visit. There is no  height or weight on file to calculate BMI. No exam data present  General: Morbidly obese pleasant middle-aged African-American female, seated, in no evident distress Head: head normocephalic and atraumatic.   Neck: supple with no carotid or supraclavicular bruits Cardiovascular: regular rate and rhythm, no murmurs; +3 pitting edema bilateral lower extremities Musculoskeletal: no deformity Skin:  no rash/petichiae Vascular:  Normal pulses all extremities  Neurologic Exam Mental Status: Awake and fully alert. Oriented to place and time. Recent and remote memory intact. Attention span, concentration and fund of knowledge appropriate. Mood and affect appropriate.  Cranial Nerves: Fundoscopic exam reveals sharp disc margins. Pupils equal, briskly reactive to light. Extraocular movements full without nystagmus. Visual fields full to confrontation. Hearing intact. Facial sensation intact. Face, tongue, palate moves normally and symmetrically.  Motor: Normal bulk and tone. Normal strength in all tested extremity muscles. Sensory.: intact to touch , pinprick , position and vibratory sensation.  Coordination: Rapid alternating movements normal in all extremities. Finger-to-nose and heel-to-shin performed accurately bilaterally.  Diminished left hand dexterity Gait and Station: Patient is currently sitting in wheelchair with cane present.  Patient ambulates with a hunched stance, short steps, with a waddle gait and assistance of cane Reflexes: 1+ and symmetric. Toes downgoing.    NIHSS  0 Modified Rankin  2    Diagnostic Data (Labs, Imaging, Testing)  SLEEP STUDY 01/19/2018 [facility] IMPRESSIONS   Severe Oxygen Desaturation   Moderate Obstructive Sleep apnea(OSA)  DIAGNOSIS   Obstructive Sleep Apnea (327.23 [G47.33 ICD-10])  RECOMMENDATIONS   Therapeutic CPAP titration or  auto-adjusting CPAP is recommended due to severity of OSA in REM sleep     ASSESSMENT: [name] [name] is a 68 y.o. year old female here with left basal ganglia infarct on 10/06/2017 secondary to small vessel disease. Vascular risk factors include morbid obesity, HTN, HLD and DM.  Patient recently underwent sleep study with diagnosis of moderate OSA with initiation of CPAP.  Patient returns today for follow-up visit and has been stable from a stroke standpoint without residual deficit or recurring of symptoms.  She does endorse medication noncompliance due to potential side effects.    PLAN: -Continue clopidogrel 75 mg daily for secondary stroke prevention -Advised her to discontinue atorvastatin due to potential statin side effects.  Lipid panel obtained at today's appointment and did advise her that depending on her levels, additional statin may be started -F/u with PCP regarding your HLD, DM and HTN management -highly encouraged to schedule follow-up appointment as she has not been seen in over 1 years time. -Will obtain A1c and CBC/CMP per patient's request -f/u with sleep center for OSA management and initiation of CPAP -Educated patient on importance of taking all prescribed medications as recommended.  Advised her that if she experiences possible side effects from her medications, to notify her providers for possible change in management. -Advised to continue to stay active as tolerated with a healthy diet -Highly encouraged weight loss -continue to monitor BP at home -Maintain strict control of hypertension with blood pressure goal below 130/90, diabetes with hemoglobin A1c goal below 6.5% and cholesterol with LDL cholesterol (bad cholesterol) goal below 70 mg/dL. I also advised the patient to eat a healthy diet with plenty of whole grains, cereals, fruits and vegetables, exercise regularly and maintain ideal body weight.  Follow up in 6 months or call earlier if needed   Greater than  50% of time during this 25 minute visit was spent on counseling,explanation of diagnosis of  left BG infarct, reviewing risk factor management/monitoring of HLD, HTN, DM and morbid obesity, planning of further management, importance of medication compliance, discussion with patient and family and coordination of care    Ihor AustinJessica McCue, WakemedGNP-BC  [facility] [address] [place], [place] [id number]  Phone [phone] Fax [phone] Note: This document was prepared with digital dictation and possible smart phrase technology. Any transcriptional errors that result from this process are unintentional.

## 2018-11-27 NOTE — Telephone Encounter (Signed)
Called and got the pt r/s fot 12/27/18 at 7:45 with NP Frann Rider. The provider was not feeling well and asked r/s pt for a Mychart visit or a later day appt.

## 2018-12-12 ENCOUNTER — Other Ambulatory Visit: Payer: Self-pay

## 2018-12-12 ENCOUNTER — Ambulatory Visit (INDEPENDENT_AMBULATORY_CARE_PROVIDER_SITE_OTHER): Payer: 59 | Admitting: Sports Medicine

## 2018-12-12 ENCOUNTER — Ambulatory Visit: Payer: 59 | Admitting: Sports Medicine

## 2018-12-12 ENCOUNTER — Encounter: Payer: Self-pay | Admitting: Sports Medicine

## 2018-12-12 VITALS — BP 187/76 | Ht 64.0 in | Wt 323.0 lb

## 2018-12-12 DIAGNOSIS — M7551 Bursitis of right shoulder: Secondary | ICD-10-CM | POA: Diagnosis not present

## 2018-12-12 DIAGNOSIS — R2241 Localized swelling, mass and lump, right lower limb: Secondary | ICD-10-CM

## 2018-12-12 MED ORDER — METHYLPREDNISOLONE ACETATE 40 MG/ML IJ SUSP
40.0000 mg | Freq: Once | INTRAMUSCULAR | Status: AC
  Administered 2018-12-12: 40 mg via INTRA_ARTICULAR

## 2018-12-12 NOTE — Progress Notes (Signed)
[facility] [address] [place], [place] [address] Phone: [phone] Fax: [phone]   Patient Name: [name] [name] Date of Birth: 1950-10-07 Medical Record Number: [id number] Gender: female Date of Encounter: 12/12/2018  SUBJECTIVE:      Chief Complaint:  Right shoulder pain and right knee pain   HPI:  [name] is presenting today for chronic right shoulder pain and right knee pain.    In regards to her shoulder, she has been having pain with overhead activity.  She states this is not the first time it has happened.  She is not having pain reaching across her body.  She is noticing mild weakness when lifting heavy objects.  There is no numbness or tingling down her arm.  She denies swelling, instability, ecchymosis, or skin changes.  It is alleviated with rest.  In regards to her knee, she had a TKA 3 years ago.  Since that time she has noticed a painful bump on the most proximal end of her surgical scar.  It is painful with palpation.  She is able to fully flex and extend her knee.  She states in the past other physicians have explained that it is nothing to worry about.  She denies any instability, numbness, weakness, or erythema.  Of note, she had a virtual appointment with her PCP this morning to discuss her blood pressure.     ROS:     See HPI.   PERTINENT  PMH / PSH / FH / SH:  Past Medical, Surgical, Social, and Family History Reviewed & Updated in the EMR. Pertinent findings include:  Bilateral TKA   OBJECTIVE:  BP (!) 187/76   Ht 5\' 4"  (1.626 m)   Wt (!) 323 lb (146.5 kg)   BMI 55.44 kg/m  Physical Exam:  Vital signs are reviewed.   GEN: Alert and oriented, NAD Pulm: Breathing unlabored PSY: normal mood, congruent affect  MSK: Right shoulder Well developed, well nourished, in no acute distress. No swelling, ecchymoses.  No gross deformity. No TTP. Decreased ROM and abduction Negative Hawkins,  Positive Neers.  Negative Yergasons. Strength 4/5 with empty can and resisted internal/external rotation. Negative apprehension. NV intact distally.  Right knee: no erythema or effusion or obvious bony abnormalities. Small palpable mass at abductors distal right quadriceps flexors over proximal end of the surgical scar that is mildly tender to palpation ROM full in flexion and extension and lower leg rotation. Ligaments with solid consistent endpoints including ACL, PCL, LCL, MCL. Negative Mcmurray's and Thessaly tests. Non painful patellar compression. Patellar glide without crepitus. Patellar and quadriceps tendons unremarkable. Hamstring and quadriceps strength is normal.  Neurovascularly intact.  Procedure: After informed written consent timeout was performed, patient was seated on exam table. Right shoulder was prepped with alcohol swab and utilizing posterior approach, patient's right subacromial space was injected with 3:1 bupivicaine: depomedrol. Patient tolerated the procedure well without immediate complications.  MSK right Limited knee ultrasound performed,  Suprapatellar pouch visualized in long and short axis with  small effusion. Small hyperechoic pocket visualized at distal quadriceps that measures 2.34 cm in longitudinal diameter.   IMPRESSION:  Distal quadriceps lipoma   ASSESSMENT & PLAN:   1. Right subacromial bursitis  Successful injection as above.  Recommended to continue HEP and ice.  She will follow-up with [place] as needed.  2.  Right knee mass  Likely due to herniation of subcutaneous tissue that could be lipoma as seen on ultrasound.  Recommending conservative treatment with compression  and ice as needed.  Avoid direct massage and palpation to the area.  She will follow-up as needed, in the future, if formal imaging to further evaluate.   Judge Stall, DO, ATC Sports Medicine Fellow  Patient seen and evaluated with the sports medicine fellow.  I agree with the above  plan of care.  Follow-up as needed.

## 2018-12-27 ENCOUNTER — Ambulatory Visit (INDEPENDENT_AMBULATORY_CARE_PROVIDER_SITE_OTHER): Payer: 59 | Admitting: Adult Health

## 2018-12-27 ENCOUNTER — Other Ambulatory Visit: Payer: Self-pay

## 2018-12-27 ENCOUNTER — Encounter: Payer: Self-pay | Admitting: Adult Health

## 2018-12-27 VITALS — BP 162/78 | HR 77 | Temp 97.1°F | Ht 64.0 in | Wt 313.8 lb

## 2018-12-27 DIAGNOSIS — R7303 Prediabetes: Secondary | ICD-10-CM | POA: Diagnosis not present

## 2018-12-27 DIAGNOSIS — Z9114 Patient's other noncompliance with medication regimen: Secondary | ICD-10-CM

## 2018-12-27 DIAGNOSIS — E782 Mixed hyperlipidemia: Secondary | ICD-10-CM

## 2018-12-27 DIAGNOSIS — I63511 Cerebral infarction due to unspecified occlusion or stenosis of right middle cerebral artery: Secondary | ICD-10-CM

## 2018-12-27 DIAGNOSIS — I10 Essential (primary) hypertension: Secondary | ICD-10-CM

## 2018-12-27 DIAGNOSIS — G44209 Tension-type headache, unspecified, not intractable: Secondary | ICD-10-CM

## 2018-12-27 DIAGNOSIS — G4733 Obstructive sleep apnea (adult) (pediatric): Secondary | ICD-10-CM

## 2018-12-27 MED ORDER — TOPIRAMATE 25 MG PO TABS: 25.0000 mg | ORAL_TABLET | Freq: Every day | ORAL | 5 refills | Status: DC

## 2018-12-27 NOTE — Patient Instructions (Addendum)
Trial of Topamax 25 mg nightly for likely tension related headaches  Continue clopidogrel 75 mg daily for secondary stroke prevention  Continue to follow with your PCP for cholesterol monitoring  Continue to follow up with PCP regarding cholesterol and blood pressure management   Recommend contacting your prior sleep specialist regarding use of sleep apnea management because it will be important for this to be controlled as it can help improve your blood pressures can lower your risk for a recurrent stroke or worsening heart disease  Monitor your blood pressure at home and follow-up with your PCP due to continued elevated blood pressures.  Continued elevated blood pressures puts you at a higher risk of another stroke or heart attack.  Maintain strict control of hypertension with blood pressure goal below 130/90, diabetes with hemoglobin A1c goal below 6.5% and cholesterol with LDL cholesterol (bad cholesterol) goal below 70 mg/dL. I also advised the patient to eat a healthy diet with plenty of whole grains, cereals, fruits and vegetables, exercise regularly and maintain ideal body weight.  Followup in the future with me in 6 months or call earlier if needed       [facility] you [facility]. I hope we have been able to provide you high quality care today.  You may receive a patient satisfaction survey over the next few weeks. We would appreciate your feedback and comments so that we may continue to improve ourselves and the health of our patients.   Topiramate tablets What is this medicine? TOPIRAMATE (toe PYRE a mate) is used to treat seizures in adults or children with epilepsy. It is also used for the prevention of migraine headaches. This medicine may be used for other purposes; ask your health care provider or pharmacist if you have questions. COMMON BRAND NAME(S): Topamax, Topiragen What should I tell my health care provider before I take this  medicine? They need to know if you have any of these conditions:  bleeding disorders  cirrhosis of the liver or liver disease  diarrhea  glaucoma  kidney stones or kidney disease  low blood counts, like low white cell, platelet, or red cell counts  lung disease like asthma, obstructive pulmonary disease, emphysema  metabolic acidosis  on a ketogenic diet  schedule for surgery or a procedure  suicidal thoughts, plans, or attempt; a previous suicide attempt by you or a family member  an unusual or allergic reaction to topiramate, other medicines, foods, dyes, or preservatives  pregnant or trying to get pregnant  breast-feeding How should I use this medicine? Take this medicine by mouth with a glass of water. Follow the directions on the prescription label. Do not crush or chew. You may take this medicine with meals. Take your medicine at regular intervals. Do not take it more often than directed. Talk to your pediatrician regarding the use of this medicine in children. Special care may be needed. While this drug may be prescribed for children as young as 832 years of age for selected conditions, precautions do apply. Overdosage: If you think you have taken too much of this medicine contact a poison control center or emergency room at once. NOTE: This medicine is only for you. Do not share this medicine with others. What if I miss a dose? If you miss a dose, take it as soon as you can. If your next dose is to be taken in less than 6 hours, then do not take the missed dose. Take  the next dose at your regular time. Do not take double or extra doses. What may interact with this medicine? Do not take this medicine with any of the following medications:  probenecid This medicine may also interact with the following medications:  acetazolamide  alcohol  amitriptyline  aspirin and aspirin-like medicines  birth control pills  certain medicines for depression  certain  medicines for seizures  certain medicines that treat or prevent blood clots like warfarin, enoxaparin, dalteparin, apixaban, dabigatran, and rivaroxaban  digoxin  hydrochlorothiazide  lithium  medicines for pain, sleep, or muscle relaxation  metformin  methazolamide  NSAIDS, medicines for pain and inflammation, like ibuprofen or naproxen  pioglitazone  risperidone This list may not describe all possible interactions. Give your health care provider a list of all the medicines, herbs, non-prescription drugs, or dietary supplements you use. Also tell them if you smoke, drink alcohol, or use illegal drugs. Some items may interact with your medicine. What should I watch for while using this medicine? Visit your doctor or health care professional for regular checks on your progress. Do not stop taking this medicine suddenly. This increases the risk of seizures if you are using this medicine to control epilepsy. Wear a medical identification bracelet or chain to say you have epilepsy or seizures, and carry a card that lists all your medicines. This medicine can decrease sweating and increase your body temperature. Watch for signs of deceased sweating or fever, especially in children. Avoid extreme heat, hot baths, and saunas. Be careful about exercising, especially in hot weather. Contact your health care provider right away if you notice a fever or decrease in sweating. You should drink plenty of fluids while taking this medicine. If you have had kidney stones in the past, this will help to reduce your chances of forming kidney stones. If you have stomach pain, with nausea or vomiting and yellowing of your eyes or skin, call your doctor immediately. You may get drowsy, dizzy, or have blurred vision. Do not drive, use machinery, or do anything that needs mental alertness until you know how this medicine affects you. To reduce dizziness, do not sit or stand up quickly, especially if you are an  older patient. Alcohol can increase drowsiness and dizziness. Avoid alcoholic drinks. If you notice blurred vision, eye pain, or other eye problems, seek medical attention at once for an eye exam. The use of this medicine may increase the chance of suicidal thoughts or actions. Pay special attention to how you are responding while on this medicine. Any worsening of mood, or thoughts of suicide or dying should be reported to your health care professional right away. This medicine may increase the chance of developing metabolic acidosis. If left untreated, this can cause kidney stones, bone disease, or slowed growth in children. Symptoms include breathing fast, fatigue, loss of appetite, irregular heartbeat, or loss of consciousness. Call your doctor immediately if you experience any of these side effects. Also, tell your doctor about any surgery you plan on having while taking this medicine since this may increase your risk for metabolic acidosis. Birth control pills may not work properly while you are taking this medicine. Talk to your doctor about using an extra method of birth control. Women who become pregnant while using this medicine may enroll in the [place] Pregnancy Registry by calling [phone]. This registry collects information about the safety of antiepileptic drug use during pregnancy. What side effects may I notice from receiving this medicine? Side  effects that you should report to your doctor or health care professional as soon as possible:  allergic reactions like skin rash, itching or hives, swelling of the face, lips, or tongue  decreased sweating and/or rise in body temperature  depression  difficulty breathing, fast or irregular breathing patterns  difficulty speaking  difficulty walking or controlling muscle movements  hearing impairment  redness, blistering, peeling or loosening of the skin, including inside the mouth  tingling, pain or  numbness in the hands or feet  unusual bleeding or bruising  unusually weak or tired  worsening of mood, thoughts or actions of suicide or dying Side effects that usually do not require medical attention (report to your doctor or health care professional if they continue or are bothersome):  altered taste  back pain, joint or muscle aches and pains  diarrhea, or constipation  headache  loss of appetite  nausea  stomach upset, indigestion  tremors This list may not describe all possible side effects. Call your doctor for medical advice about side effects. You may report side effects to FDA at 1-800-FDA-1088. Where should I keep my medicine? Keep out of the reach of children. Store at room temperature between 15 and 30 degrees C (59 and 86 degrees F) in a tightly closed container. Protect from moisture. Throw away any unused medicine after the expiration date. NOTE: This sheet is a summary. It may not cover all possible information. If you have questions about this medicine, talk to your doctor, pharmacist, or health care provider.  2020 Elsevier/Gold Standard (2013-02-05 23:17:57)

## 2018-12-27 NOTE — Progress Notes (Signed)
[facility] [address] [place]. [place] [address] [phone]       OFFICE FOLLOW UP NOTE  [name] Date of Birth:  31-Jan-1951 Medical Record Number:  [id number]   Reason for visit: Right basal ganglia infarct follow-up visit  CHIEF COMPLAINT:  Chief Complaint  Patient presents with   Follow-up    Stroke follow up room 9 pt had cane but requested a wheelhchair when i room her     HPI:  Update 12/27/2018: [name] [name] is being seen today for stroke follow-up.  She has been stable from a stroke standpoint without reoccurring or new stroke/TIA symptoms.  She does not have residual deficits.  She is not currently on statin medications as she was taking pravastatin and due to increase myalgias potentially statin related, orthopedic provider recommended trialing discontinuing for a 4-day duration and did experience improvement of her pains.  She tried Crestor but unfortunately also unable to tolerate.  She states recent PCP lipid panel satisfactory.  She does endorse continuation of Plavix without bleeding or bruising. Blood pressure continues to be uncontrolled with today's reading 162/78.  She continues to work with her PCP for management but either experiences side effects or refuses to take medications.  She also has been diagnosed with sleep apnea with recommended CPAP but only used for 1 month and self discontinued due to intolerance.  She has not followed up with her sleep provider since that time.  She also continues to experience right-sided temporal/frontal headaches that are typically more present in the morning or with increased stress.  Denies associated migrainous symptoms.  Continues to be followed by sports medicine with recent injection in right knee with improvement in pain.  Ambulates short distance with cane but requested a wheelchair to be brought to exam room due to increased pain with long distance.  She does endorse 17 pound weight loss  recently.  No further concerns at this time.   Interval history 03/03/2018: Patient is being seen today for 50-month follow-up visit and is accompanied by her granddaughter.  Overall, she has been stable from a stroke stand point without residual deficits.  She has been participating in water aerobics for knee and back pain. She did have sleep study which showed severe oxygen desaturation and moderate obstructive sleep apnea.  Recommended CPAP titration or auto adjusting CPAP. She does have to go back for a CPAP titration 03/14/18. She has been having a difficult time with her medications due to side effects of leg pain, back pain and numbness. She states when she doesn't take any of her medications, she does not experience these symptoms.  She also has stopped all of her blood pressure medications and has been substituting antihypertensives with CBD oil and does feel as though she has had improvement of her blood pressure.  Blood pressure today 178/81 and she states this is due to her walking into the office.  She does continue to take Plavix intermittently without side effects of bleeding or bruising.  She does continue to take atorvastatin intermittently and questioning whether she is experiencing statin side effects.  She also endorses infrequent headaches with pressure behind her right eye and pressure on the top of her head.  She denies photophobia, phonophobia or nausea/vomiting.  She denies history of headaches or migraines.  She does endorse her granddaughter has history of migraines.  She denies these headaches to be debilitating and will typically resolve on their own.  These headaches occur possibly once per  month.  These have been present for the past year.  No further concerns at this time.  Denies new or worsening stroke/TIA symptoms.   11/24/2017 visit: Patient is being seen today for hospital follow-up and is accompanied by her granddaughter.  She resided at [facility] for 1 week and then  was discharged home without therapy needs.  She is currently living with her daughter and grandchildren as she was unable to return to her home due to black mold.  She does plan on obtaining her own place soon to live independently.  She does continue to have residual left fine motor control weakness but this has been improving.  Denies any other residual deficits or recurring of symptoms.  She is currently sitting in wheelchair as she uses this for long distance due to increased shortness of breath with ambulation along with right knee pain which is chronic.  Patient will use a cane for short distance.  She has completed 3 weeks DAPT and currently on Plavix alone without side effects of bleeding or bruising.  She continues on atorvastatin 80 mg daily without side effects of myalgias.  Blood pressure today 161/76 and as patient does monitor this at home, SBP typically ranges from 1 50-1 60.  She states she was previously on antihypertensives prior to admission but her blood pressure continued to be at this range despite use of antihypertensives.  She will be seeing a new PCP on 12/13/2017 and will speak to her regarding HTN management.  She also plans on speaking to her regarding OSA referral and work-up.  No further concerns at this time and denies new or worsening stroke/TIA symptoms.   Hospital admission 10/06/2017: [name] [name] is a 68 y.o. female with history of HTN, HLD, borderline DB,  who presented with L HP that started on Sunday, developed CP.  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  MRA showed narrowing of the right VA V4.  Carotid Doppler showed bilateral ICA stenosis of 1 to 39%.  2D echo showed an EF of 55 to 60% without cardiac source of embolus identified.  LDL 114 and is recommended to increase  Lipitor to 80 mg daily.  HTN stable during admission and recommended long-term BP goal normotensive range.  A1c 6.2 and recommended continued follow-up and management for DM with PCP.  Previously on aspirin 81 mg and recommended DAPT for 3 weeks and Plavix alone.  It was determined that the stroke was likely secondary to small vessel disease due to multiple vascular risk factors including morbid obesity, diabetes, hypertension and hyperlipidemia.  Was also recommended for patient to undergo sleep apnea work-up outpatient.  PT/OT recommended SNF placement and was discharged in stable condition to [facility].      ROS:   14 system review of systems performed and negative with exception of back pain, headache, walking difficulty, joint pain  PMH:  Past Medical History:  Diagnosis Date   Arthritis    "right knee" (07/02/2015)   Borderline diabetes    Hyperlipidemia    Hypertension    Overactive bladder    S/P right knee arthroscopy    with septic joint growing out MSSA   Septic arthritis of knee, right (HCC)    Wears glasses     PSH:  Past Surgical History:  Procedure Laterality Date  COLONOSCOPY     DIAGNOSTIC LAPAROSCOPY     JOINT REPLACEMENT     KNEE ARTHROSCOPY Right 12/09/2012   Procedure: ARTHROSCOPIC IRRIGATION AND DEBRIDEMENT RIGHT KNEE;  Surgeon: Budd PalmerMichael H Handy, MD;  Location: [facility] OR;  Service: Orthopedics;  Laterality: Right;   LEFT HEART CATH AND CORONARY ANGIOGRAPHY N/A 09/02/2016   Procedure: Left Heart Cath and Coronary Angiography;  Surgeon: Rinaldo CloudHarwani, Mohan, MD;  [facility];  Service: Cardiovascular;  Laterality: N/A;   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/06/2014   Procedure: PLACEMENT OF CEMENT SPACER;  Surgeon: Loreta Aveaniel F Murphy, MD;  [facility];  Service: Orthopedics;  Laterality: Right;   TOTAL KNEE ARTHROPLASTY Bilateral 2011-2012   left-right   TOTAL KNEE REVISION Right 11/06/2014   Procedure: REMOVAL OF TOTAL  COMPONENTS, EXTENSIVE  IRRIGATION AND DEBRIDEMENT  RIGHT KNEE;  Surgeon: Loreta Aveaniel F Murphy, MD;  Location: [facility] OR;  Service: Orthopedics;  Laterality: Right;   TOTAL KNEE REVISION Right 07/02/2015   TOTAL KNEE REVISION Right 07/02/2015   Procedure: TOTAL KNEE REVISION;  Surgeon: Loreta Aveaniel F Murphy, MD;  [facility];  Service: Orthopedics;  Laterality: Right;   TUBAL LIGATION  ~ 1980    Social History:  Social History   Socioeconomic History   Marital status: Divorced    Spouse name: Not on file   Number of children: Not on file   Years of education: Not on file   Highest education level: Not on file  Occupational History   Not on file  Social Needs   Financial resource strain: Not on file   Food insecurity    Worry: Not on file    Inability: Not on file   Transportation needs    Medical: Not on file    Non-medical: Not on file  Tobacco Use   Smoking status: Former Smoker    Packs/day: 0.25    Years: 23.00    Pack years: 5.75    Types: Cigarettes    Quit date: 08/01/1992    Years since quitting: 26.4   Smokeless tobacco: Never Used  Substance and Sexual Activity   Alcohol use: No   Drug use: No   Sexual activity: Never  Lifestyle   Physical activity    Days per week: Not on file    Minutes per session: Not on file   Stress: Not on file  Relationships   Social connections    Talks on phone: Not on file    Gets together: Not on file    Attends religious service: Not on file    Active member of club or organization: Not on file    Attends meetings of clubs or organizations: Not on file    Relationship status: Not on file   Intimate partner violence    Fear of current or ex partner: Not on file    Emotionally abused: Not on file    Physically abused: Not on file    Forced sexual activity: Not on file  Other Topics Concern   Not on file  Social History Narrative   Not on file    Family History:  Family History  Problem Relation Age of Onset     Cancer - Other Mother    Hypertension Other     Medications:   Current Outpatient Medications on File Prior to Visit  Medication Sig Dispense Refill   amoxicillin (AMOXIL) 500 MG capsule as needed.      BYSTOLIC 10 MG tablet TAKE ONE  TABLET BY MOUTH ONCE DAILY FOR 30 DAYS     clopidogrel (PLAVIX) 75 MG tablet Take 1 tablet (75 mg total) by mouth daily. 30 tablet 0   losartan (COZAAR) 25 MG tablet 50 mg.      MYRBETRIQ 50 MG TB24 tablet Take 50 mg by mouth daily.  11   nitroGLYCERIN (NITROSTAT) 0.4 MG SL tablet Place 0.4 mg under the tongue every 5 (five) minutes x 3 doses as needed. For chest pain.  3   No current facility-administered medications on file prior to visit.     Allergies:  No Known Allergies   Physical Exam  Vitals:   12/27/18 0748  BP: (!) 162/78  Pulse: 77  Temp: (!) 97.1 F (36.2 C)  Weight: (!) 313 lb 12.8 oz (142.3 kg)  Height: 5\' 4"  (1.626 m)   Body mass index is 53.86 kg/m. No exam data present  General: Morbidly obese pleasant middle-aged African-American female, seated, in no evident distress Head: head normocephalic and atraumatic.   Neck: supple with no carotid or supraclavicular bruits Cardiovascular: regular rate and rhythm, no murmurs Musculoskeletal: no deformity; limited RLE ROM due to pain Skin:  no rash/petichiae Vascular:  Normal pulses all extremities  Neurologic Exam Mental Status: Awake and fully alert. Oriented to place and time. Recent and remote memory intact. Attention span, concentration and fund of knowledge appropriate. Mood and affect appropriate.  Cranial Nerves: Pupils equal, briskly reactive to light. Extraocular movements full without nystagmus. Visual fields full to confrontation. Hearing intact. Facial sensation intact. Face, tongue, palate moves normally and symmetrically.  Motor: Normal bulk and tone. Normal strength in all tested extremity muscles.  Sensory.: intact to touch , pinprick , position and  vibratory sensation.  Coordination: Rapid alternating movements normal in all extremities. Finger-to-nose and heel-to-shin performed accurately bilaterally.   Gait and Station: Patient is currently sitting in wheelchair with cane present.  Abnormal waddling type gait and assistance of cane for short distance Reflexes: 1+ and symmetric. Toes downgoing.      Diagnostic Data (Labs, Imaging, Testing)  SLEEP STUDY 01/19/2018 [facility] IMPRESSIONS   Severe Oxygen Desaturation   Moderate Obstructive Sleep apnea(OSA)  DIAGNOSIS   Obstructive Sleep Apnea (327.23 [G47.33 ICD-10])  RECOMMENDATIONS   Therapeutic CPAP titration or auto-adjusting CPAP is recommended due to severity of OSA in REM sleep     ASSESSMENT: [name] [name] is a 68 y.o. year old female here with left basal ganglia infarct on 10/06/2017 secondary to small vessel disease. Vascular risk factors include morbid obesity, HTN, HLD and DM.  Patient recently underwent sleep study with diagnosis of moderate OSA with initiation of CPAP.  She recovered well from a stroke standpoint without residual deficits.  Currently not compliant with CPAP due to intolerance.  Continues to experience tension type and morning headaches.  She does continue to have multiple areas of pain and continues to follow with sports medicine for injections.    PLAN: -Continue clopidogrel 75 mg daily for secondary stroke prevention -History of statin intolerance and advised to continue to follow with PCP for ongoing management.  If LDL> 70, would recommend trialing Zetia or PCSK9 inhibitor -Advised to continue to follow PCP for uncontrolled HTN management.  Advised patient that continued elevated blood pressures increases chance of recurrent stroke and heart attack -F/u with PCP regarding your HLD, DM and HTN management -Advised to contact sleep center at [facility] for difficulty tolerating CPAP.  Discussion regarding importance  of sleep apnea  management in regards to stroke and cardiovascular risks.  Also advised her treatment of CPAP can limit tension type and morning headaches along with possibly improved blood pressure -Trial Topamax 25 mg nightly for tension type headaches.  Provided patient with medication information -Congratulated on current weight loss and highly encouraged continuation -Maintain strict control of hypertension with blood pressure goal below 130/90, diabetes with hemoglobin A1c goal below 6.5% and cholesterol with LDL cholesterol (bad cholesterol) goal below 70 mg/dL. I also advised the patient to eat a healthy diet with plenty of whole grains, cereals, fruits and vegetables, exercise regularly and maintain ideal body weight.  Follow up in 6 months or call earlier if needed   Greater than 50% of time during this 25 minute visit was spent on counseling,explanation of diagnosis of left BG infarct, reviewing risk factor management/monitoring of HLD, HTN, DM and morbid obesity, importance of medication compliance, importance of CPAP management for OSA, planning of further management, discussion with patient and answered all questions to patient satisfaction    Ihor Austin, AGNP-BC  [facility] [address] [place], [place] [address]  Phone [phone] Fax [phone] Note: This document was prepared with digital dictation and possible smart phrase technology. Any transcriptional errors that result from this process are unintentional.

## 2018-12-31 NOTE — Progress Notes (Signed)
I agree with the above plan 

## 2019-01-02 ENCOUNTER — Ambulatory Visit: Payer: 59 | Admitting: Sports Medicine

## 2019-01-24 ENCOUNTER — Ambulatory Visit: Payer: 59 | Admitting: Sports Medicine

## 2019-01-25 ENCOUNTER — Encounter: Payer: Self-pay | Admitting: Critical Care Medicine

## 2019-01-25 ENCOUNTER — Institutional Professional Consult (permissible substitution): Payer: 59 | Admitting: Critical Care Medicine

## 2019-01-25 NOTE — Progress Notes (Deleted)
Synopsis: Referred in December 2020 for *** by Laurann MontanaWhite, Cynthia, MD  Subjective:   PATIENT ID: [name] HarborMyrtle [name] GENDER: female DOB: 29-Mar-1950, MRN: [id number]  No chief complaint on file.   HPI ***Flu shot?*** Pneumonia shots?  Family history of lung disease?   Tobacco- quit in *** ***Years  x   ***PPD  Past Medical History:  Diagnosis Date  . Arthritis    "right knee" (07/02/2015)  . Borderline diabetes   . 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 Hyperlipidemia   . Hypertension   . Overactive bladder   . S/P right knee arthroscopy    with septic joint growing out MSSA  . Septic arthritis of knee, right (HCC)   . Wears glasses      Family History  Problem Relation Age of Onset  . Cancer - Other Mother   . Hypertension Other      Past Surgical History:  Procedure Laterality Date  . COLONOSCOPY    . DIAGNOSTIC LAPAROSCOPY    . JOINT REPLACEMENT    . KNEE ARTHROSCOPY Right 12/09/2012   Procedure: ARTHROSCOPIC IRRIGATION AND DEBRIDEMENT RIGHT KNEE;  Surgeon: Budd PalmerMichael H Handy, MD;  Location: [facility] OR;  Service: Orthopedics;  Laterality: Right;  . LEFT HEART CATH AND CORONARY ANGIOGRAPHY N/A 09/02/2016   Procedure: Left Heart Cath and Coronary Angiography;  Surgeon: Rinaldo CloudHarwani, Mohan, MD;  [facility];  Service: Cardiovascular;  Laterality: N/A;  . 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 TOTAL KNEE ARTHROPLASTY Bilateral 2011-2012   left-right  . TOTAL KNEE REVISION Right 11/06/2014   Procedure: REMOVAL OF TOTAL COMPONENTS, EXTENSIVE  IRRIGATION AND DEBRIDEMENT  RIGHT KNEE;  Surgeon: Loreta Aveaniel F Murphy, MD;  [facility];  Service: Orthopedics;  Laterality: Right;  . TOTAL KNEE REVISION Right 07/02/2015  . TOTAL KNEE REVISION Right 07/02/2015   Procedure: TOTAL KNEE REVISION;  Surgeon:  Loreta Aveaniel F Murphy, MD;  [facility];  Service: Orthopedics;  Laterality: Right;  . TUBAL LIGATION  ~ 1980    Social History   Socioeconomic History  . Marital status: Divorced    Spouse name: Not on file  . Number of children: Not on file  . Years of education: Not on file  . Highest education level: Not on file  Occupational History  . Not on file  Tobacco Use  . Smoking status: Former Smoker    Packs/day: 0.25    Years: 23.00    Pack years: 5.75    Types: Cigarettes    Quit date: 08/01/1992    Years since quitting: 26.5  . Smokeless tobacco: Never Used  Substance and Sexual Activity  . Alcohol use: No  . Drug use: No  . Sexual activity: Never  Other Topics Concern  . Not on file  Social History Narrative  . Not on file   Social Determinants of Health   Financial Resource Strain:   . Difficulty of Paying Living Expenses: Not on file  Food Insecurity:   . Worried About Programme researcher, broadcasting/film/videounning Out of Food in the Last Year: Not on file  . Ran Out of Food in the Last Year: Not on file  Transportation Needs:   . Lack of Transportation (Medical): Not on file  . Lack of Transportation (Non-Medical): Not on file  Physical Activity:   . Days  of Exercise per Week: Not on file  . Minutes of Exercise per Session: Not on file  Stress:   . Feeling of Stress : Not on file  Social Connections:   . Frequency of Communication with Friends and Family: Not on file  . Frequency of Social Gatherings with Friends and Family: Not on file  . Attends Religious Services: Not on file  . Active Member of Clubs or Organizations: Not on file  . Attends Archivist Meetings: Not on file  . Marital Status: Not on file  Intimate Partner Violence:   . Fear of Current or Ex-Partner: Not on file  . Emotionally Abused: Not on file  . Physically Abused: Not on file  . Sexually Abused: Not on file     No Known Allergies    There is no immunization history on file for this patient.  Outpatient  Medications Prior to Visit  Medication Sig Dispense Refill  . amoxicillin (AMOXIL) 500 MG capsule as needed.     [name] Kitchen BYSTOLIC 10 MG tablet TAKE ONE TABLET BY MOUTH ONCE DAILY FOR 30 DAYS    . clopidogrel (PLAVIX) 75 MG tablet Take 1 tablet (75 mg total) by mouth daily. 30 tablet 0  . losartan (COZAAR) 25 MG tablet 50 mg.     . MYRBETRIQ 50 MG TB24 tablet Take 50 mg by mouth daily.  11  . nitroGLYCERIN (NITROSTAT) 0.4 MG SL tablet Place 0.4 mg under the tongue every 5 (five) minutes x 3 doses as needed. For chest pain.  3  . topiramate (TOPAMAX) 25 MG tablet Take 1 tablet (25 mg total) by mouth daily. 30 tablet 5   No facility-administered medications prior to visit.    ROS   Objective:  There were no vitals filed for this visit.   on *** LPM *** RA BMI Readings from Last 3 Encounters:  12/27/18 53.86 kg/m  12/12/18 55.44 kg/m  11/14/18 55.44 kg/m   Wt Readings from Last 3 Encounters:  12/27/18 (!) 313 lb 12.8 oz (142.3 kg)  12/12/18 (!) 323 lb (146.5 kg)  11/14/18 (!) 323 lb (146.5 kg)    Physical Exam   CBC    Component Value Date/Time   WBC 6.2 03/03/2018 1142   WBC 7.2 10/06/2017 0042   RBC 5.48 (H) 03/03/2018 1142   RBC 5.30 (H) 10/06/2017 0859   RBC 5.25 (H) 10/06/2017 0042   HGB 12.5 03/03/2018 1142   HCT 40.6 03/03/2018 1142   PLT 297 03/03/2018 1142   MCV 74 (L) 03/03/2018 1142   MCH 22.8 (L) 03/03/2018 1142   MCH 22.1 (L) 10/06/2017 0042   MCHC 30.8 (L) 03/03/2018 1142   MCHC 28.5 (L) 10/06/2017 0042   RDW 16.6 (H) 03/03/2018 1142   LYMPHSABS 2.3 10/06/2017 0042   MONOABS 0.8 10/06/2017 0042   EOSABS 0.1 10/06/2017 0042   BASOSABS 0.1 10/06/2017 0042    CHEMISTRY No results for input(s): NA, K, CL, CO2, GLUCOSE, BUN, CREATININE, CALCIUM, MG, PHOS in the last 168 hours. CrCl cannot be calculated (Patient's most recent lab result is older than the maximum 21 days allowed.). ***  Chest Imaging- films reviewed: CXR, 2 view 10/06/2017-increased  vascular congestion, B-lines, mild cephalization.  Diaphragmatic eventration posteriorly.  Pulmonary Functions Testing Results: No flowsheet data found.   Echocardiogram 10/06/2017: LVEF 55 to 60%, normal wall motion.  Mild LVH.  Normal LA. RV and RA not well seen.  No PFO.  Trivial MR and TR, normal aortic valve.  Heart Catheterization 09/02/2016: (performed for anginal CP and abnormal stress test)  Mid RCA lesion, 30 %stenosed.  RPDA lesion, 30 %stenosed.  Mid LAD lesion, 20 %stenosed.  Ost 2nd Diag to 2nd Diag lesion, 30 %stenosed.  The left ventricular systolic function is normal.  LV end diastolic pressure is normal.  The left ventricular ejection fraction is 55-65% by visual estimate.  Mid Cx to Dist Cx lesion, 40 %stenosed.    Assessment & Plan:   No diagnosis found.    Current Outpatient Medications:  .  amoxicillin (AMOXIL) 500 MG capsule, as needed. , Disp: , Rfl:  .  BYSTOLIC 10 MG tablet, TAKE ONE TABLET BY MOUTH ONCE DAILY FOR 30 DAYS, Disp: , Rfl:  .  clopidogrel (PLAVIX) 75 MG tablet, Take 1 tablet (75 mg total) by mouth daily., Disp: 30 tablet, Rfl: 0 .  losartan (COZAAR) 25 MG tablet, 50 mg. , Disp: , Rfl:  .  MYRBETRIQ 50 MG TB24 tablet, Take 50 mg by mouth daily., Disp: , Rfl: 11 .  nitroGLYCERIN (NITROSTAT) 0.4 MG SL tablet, Place 0.4 mg under the tongue every 5 (five) minutes x 3 doses as needed. For chest pain., Disp: , Rfl: 3 .  topiramate (TOPAMAX) 25 MG tablet, Take 1 tablet (25 mg total) by mouth daily., Disp: 30 tablet, Rfl: 5   Steffanie Dunn, DO [facility] 01/25/2019 7:24 AM

## 2019-01-26 ENCOUNTER — Other Ambulatory Visit: Payer: Self-pay

## 2019-01-26 ENCOUNTER — Ambulatory Visit (INDEPENDENT_AMBULATORY_CARE_PROVIDER_SITE_OTHER): Payer: 59 | Admitting: Critical Care Medicine

## 2019-01-26 ENCOUNTER — Encounter: Payer: Self-pay | Admitting: Critical Care Medicine

## 2019-01-26 VITALS — BP 140/76 | HR 70 | Temp 97.5°F | Ht 64.0 in | Wt 316.4 lb

## 2019-01-26 DIAGNOSIS — R5381 Other malaise: Secondary | ICD-10-CM | POA: Diagnosis not present

## 2019-01-26 DIAGNOSIS — R0600 Dyspnea, unspecified: Secondary | ICD-10-CM | POA: Diagnosis not present

## 2019-01-26 DIAGNOSIS — R0609 Other forms of dyspnea: Secondary | ICD-10-CM

## 2019-01-26 MED ORDER — ALBUTEROL SULFATE HFA 108 (90 BASE) MCG/ACT IN AERS: 2.0000 | INHALATION_SPRAY | Freq: Four times a day (QID) | RESPIRATORY_TRACT | 1 refills | Status: DC | PRN

## 2019-01-26 NOTE — Progress Notes (Signed)
Synopsis: Referred in December 2020 for shortness of breath by Laurann Montana, MD.  Subjective:   PATIENT ID: [name] [name] GENDER: female DOB: 28-Nov-1950, MRN: [id number]  Chief Complaint  Patient presents with  . Consult    Patient is here for shortness of breath with exertion and daily activites. Patient states that she has a cough that is sometimes productive    Mrs. Townsel is a 68 year old woman who presents for evaluation of dyspnea on exertion that has been ongoing for several years.  It is been stable over this time.  She states that it started after having her most recent right knee surgery.  She had to have multiple revisions of a total knee arthroplasty on the right, but had done well after her left knee arthroplasty about 10 years ago.  It was first noticed that she had dyspnea on exertion when working visit with physical therapy.  She occasionally has wheezing and coughing spells, but not frequently.  She denies sputum production.  She quit smoking about 25 years ago after 12 years x 0.5 ppd.  Prior to her most recent knee surgery she was very active without any activity limitations, including being able to cut her own grass.  Now she can hardly stand to wash the dishes for 5 minutes or walk outside of her house.  She has no shortness of breath at rest or when sleeping.  She was previously diagnosed with OSA, but was not able to tolerate CPAP.  She reports that she has gained weight in the last few years.  She has tried multiple detox type diets without success in the past.  When she was in a skilled nursing unit for several weeks following her knee surgery she lost 45 pounds because she disliked with food and mostly ate salad.  She has found it hard to lose weight since then and reports that she gained 3 pounds this week.  In addition to dyspnea on exertion she is limited in her activity due to right knee pain and back pain.  She walks with a walker.  She denies a history of childhood  asthma or family history of lung disease.  She is not interested in having flu or pneumonia vaccinations.      Past Medical History:  Diagnosis Date  . Arthritis    "right knee" (07/02/2015)  . 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 Diabetes (HCC)    type II  . Hyperlipidemia   . Hypertension   . Obesity   . OSA (obstructive sleep apnea)   . Overactive bladder   . S/P right knee arthroscopy    with septic joint growing out MSSA  . Septic arthritis of knee, right (HCC)   . Wears glasses      Family History  Problem Relation Age of Onset  . Breast cancer Mother   . Stroke Father   . Hypertension Other      Past Surgical History:  Procedure Laterality Date  . COLONOSCOPY    . DIAGNOSTIC LAPAROSCOPY    . JOINT REPLACEMENT    . KNEE ARTHROSCOPY Right 12/09/2012   Procedure: ARTHROSCOPIC IRRIGATION AND DEBRIDEMENT RIGHT KNEE;  Surgeon: Budd Palmer, MD;  Location: [facility] OR;  Service: Orthopedics;  Laterality: Right;  . LEFT HEART CATH AND CORONARY ANGIOGRAPHY N/A 09/02/2016   Procedure: Left Heart Cath and Coronary Angiography;  Surgeon: Rinaldo Cloud, MD;  [facility] CV LAB;  Service: Cardiovascular;  Laterality: N/A;  .  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 TOTAL KNEE ARTHROPLASTY Bilateral 2011-2012   left-right  . TOTAL KNEE REVISION Right 11/06/2014   Procedure: REMOVAL OF TOTAL COMPONENTS, EXTENSIVE  IRRIGATION AND DEBRIDEMENT  RIGHT KNEE;  Surgeon: Loreta Aveaniel F Murphy, MD;  [facility];  Service: Orthopedics;  Laterality: Right;  . TOTAL KNEE REVISION Right 07/02/2015  . TOTAL KNEE REVISION Right 07/02/2015   Procedure: TOTAL KNEE REVISION;  Surgeon: Loreta Aveaniel F Murphy, MD;  [facility];  Service: Orthopedics;  Laterality: Right;  . TUBAL LIGATION  ~ 1980  . tummy tuck  1980s     Social History   Socioeconomic History  . Marital status: Divorced    Spouse name: Not on file  . Number of children: Not on file  . Years of education: Not on file  . Highest education level: Not on file  Occupational History  . Not on file  Tobacco Use  . Smoking status: Former Smoker    Packs/day: 0.50    Years: 12.00    Pack years: 6.00    Types: Cigarettes    Quit date: 08/01/1992    Years since quitting: 26.5  . Smokeless tobacco: Never Used  Substance and Sexual Activity  . Alcohol use: No  . Drug use: No  . Sexual activity: Never  Other Topics Concern  . Not on file  Social History Narrative  . Not on file   Social Determinants of Health   Financial Resource Strain:   . Difficulty of Paying Living Expenses: Not on file  Food Insecurity:   . Worried About Programme researcher, broadcasting/film/videounning Out of Food in the Last Year: Not on file  . Ran Out of Food in the Last Year: Not on file  Transportation Needs:   . Lack of Transportation (Medical): Not on file  . Lack of Transportation (Non-Medical): Not on file  Physical Activity:   . Days of Exercise per Week: Not on file  . Minutes of Exercise per Session: Not on file  Stress:   . Feeling of Stress : Not on file  Social Connections:   . Frequency of Communication with Friends and Family: Not on file  . Frequency of Social Gatherings with Friends and Family: Not on file  . Attends Religious Services: Not on file  . Active Member of Clubs or Organizations: Not on file  . Attends BankerClub or Organization Meetings: Not on file  . Marital Status: Not on file  Intimate Partner Violence:   . Fear of Current or Ex-Partner: Not on file  . Emotionally Abused: Not on file  . Physically Abused: Not on file  . Sexually Abused: Not on file     No Known Allergies    There is no immunization history on file for this patient.  Outpatient Medications Prior to Visit  Medication Sig Dispense Refill  . amoxicillin (AMOXIL) 500 MG capsule as needed.      [name] Kitchen. BYSTOLIC 10 MG tablet TAKE ONE TABLET BY MOUTH ONCE DAILY FOR 30 DAYS    . chlorhexidine (PERIDEX) 0.12 % solution 15 mLs by Mouth Rinse route daily.    . clopidogrel (PLAVIX) 75 MG tablet Take 1 tablet (75 mg total) by mouth daily. 30 tablet 0  . losartan (COZAAR) 25 MG tablet 50 mg.     . MYRBETRIQ 50 MG TB24 tablet Take 50  mg by mouth daily.  11  . nitroGLYCERIN (NITROSTAT) 0.4 MG SL tablet Place 0.4 mg under the tongue every 5 (five) minutes x 3 doses as needed. For chest pain.  3  . trimethoprim (TRIMPEX) 100 MG tablet Take 100 mg by mouth daily.    [name] Kitchen topiramate (TOPAMAX) 25 MG tablet Take 1 tablet (25 mg total) by mouth daily. 30 tablet 5   No facility-administered medications prior to visit.    Review of Systems  Constitutional: Negative for chills, fever and weight loss.  HENT: Negative for congestion and sore throat.   Eyes: Negative.   Respiratory: Positive for cough, shortness of breath and wheezing. Negative for sputum production.   Cardiovascular: Negative for chest pain and leg swelling.  Gastrointestinal: Negative for blood in stool, diarrhea, heartburn, nausea and vomiting.  Genitourinary: Negative.   Musculoskeletal: Positive for back pain and joint pain. Negative for myalgias.  Skin: Negative for rash.  Neurological: Negative for dizziness.       Walks with a cane due to knee pain and instability  Endo/Heme/Allergies: Negative for environmental allergies.     Objective:   Vitals:   01/26/19 1142  BP: 140/76  Pulse: 70  Temp: (!) 97.5 F (36.4 C)  TempSrc: Temporal  SpO2: 97%  Weight: (!) 316 lb 6.4 oz (143.5 kg)  Height: 5\' 4"  (1.626 m)   97% on   RA BMI Readings from Last 3 Encounters:  01/26/19 54.31 kg/m  12/27/18 53.86 kg/m  12/12/18 55.44 kg/m   Wt Readings from Last 3 Encounters:  01/26/19 (!) 316 lb 6.4 oz (143.5 kg)  12/27/18 (!) 313 lb 12.8 oz (142.3 kg)  12/12/18 (!) 323 lb (146.5 kg)    Physical Exam Vitals reviewed.   Constitutional:      General: She is not in acute distress.    Appearance: She is obese. She is not ill-appearing.     Comments: Sitting in wheelchair, has her cane available.  HENT:     Head: Normocephalic and atraumatic.     Nose:     Comments: Deferred due to masking requirement.    Mouth/Throat:     Comments: Deferred due to masking requirement. Eyes:     General: No scleral icterus. Cardiovascular:     Rate and Rhythm: Normal rate and regular rhythm.     Heart sounds: No murmur.  Pulmonary:     Comments: Breathing comfortably on room air, no conversational dyspnea.  Clear to auscultation bilaterally. Abdominal:     General: There is no distension.     Palpations: Abdomen is soft.     Tenderness: There is no abdominal tenderness.  Musculoskeletal:        General: No swelling or tenderness.     Cervical back: Neck supple.  Lymphadenopathy:     Cervical: No cervical adenopathy.  Skin:    General: Skin is warm and dry.     Findings: No rash.  Neurological:     General: No focal deficit present.     Mental Status: She is alert.     Coordination: Coordination normal.  Psychiatric:        Mood and Affect: Mood normal.        Behavior: Behavior normal.      CBC    Component Value Date/Time   WBC 6.2 03/03/2018 1142   WBC 7.2 10/06/2017 0042   RBC 5.48 (H) 03/03/2018 1142   RBC 5.30 (H) 10/06/2017 0859   RBC 5.25 (H) 10/06/2017 3295  HGB 12.5 03/03/2018 1142   HCT 40.6 03/03/2018 1142   PLT 297 03/03/2018 1142   MCV 74 (L) 03/03/2018 1142   MCH 22.8 (L) 03/03/2018 1142   MCH 22.1 (L) 10/06/2017 0042   MCHC 30.8 (L) 03/03/2018 1142   MCHC 28.5 (L) 10/06/2017 0042   RDW 16.6 (H) 03/03/2018 1142   LYMPHSABS 2.3 10/06/2017 0042   MONOABS 0.8 10/06/2017 0042   EOSABS 0.1 10/06/2017 0042   BASOSABS 0.1 10/06/2017 0042     Chest Imaging- films reviewed: CXR, 2 view 10/06/2017-increased interstitial markings, B-lines, mild cephalization of flow.  Posterior  diaphragmatic eventration.  Pulmonary Functions Testing Results: No flowsheet data found.   Echocardiogram 10/06/2017: LVEF 55 to 60%, mild LVH.  Normal LA. RV and RA not well seen.  Trivial MR and TR, normal aortic valve.     Assessment & Plan:     ICD-10-CM   1. DOE (dyspnea on exertion)  R06.00 Pulmonary function test    albuterol (VENTOLIN HFA) 108 (90 Base) MCG/ACT inhaler  2. Morbid obesity due to excess calories (HCC)  E66.01 Pulmonary function test  3. Physical deconditioning  R53.81    Dyspnea on exertion-likely multifactorial with restriction due to obesity and deconditioning likely playing a role.  It is unclear if she potentially has mild COPD in addition, although her smoking history is likely minimal. -Walked in office to observe for desaturation -PFTs -Albuterol as needed -We spent a significant amount of time discussing the importance of modest weight loss and dietary modifications to support these efforts.  I recommended more fresh vegetables and resuming the type of diet that worked when she was in the nursing home. -Recommend regular physical activity, including doing exercises with her arms when she is sitting down if that helps her knee and back pain. -Recommend seasonal flu and pneumonia vaccinations, which she declined. -Continue Covid precautions-social distancing, mask wearing, handwashing   RTC in 2 months after PFTs.   Current Outpatient Medications:  .  amoxicillin (AMOXIL) 500 MG capsule, as needed. , Disp: , Rfl:  .  BYSTOLIC 10 MG tablet, TAKE ONE TABLET BY MOUTH ONCE DAILY FOR 30 DAYS, Disp: , Rfl:  .  chlorhexidine (PERIDEX) 0.12 % solution, 15 mLs by Mouth Rinse route daily., Disp: , Rfl:  .  clopidogrel (PLAVIX) 75 MG tablet, Take 1 tablet (75 mg total) by mouth daily., Disp: 30 tablet, Rfl: 0 .  losartan (COZAAR) 25 MG tablet, 50 mg. , Disp: , Rfl:  .  MYRBETRIQ 50 MG TB24 tablet, Take 50 mg by mouth daily., Disp: , Rfl: 11 .  nitroGLYCERIN  (NITROSTAT) 0.4 MG SL tablet, Place 0.4 mg under the tongue every 5 (five) minutes x 3 doses as needed. For chest pain., Disp: , Rfl: 3 .  trimethoprim (TRIMPEX) 100 MG tablet, Take 100 mg by mouth daily., Disp: , Rfl:  .  albuterol (VENTOLIN HFA) 108 (90 Base) MCG/ACT inhaler, Inhale 2 puffs into the lungs every 6 (six) hours as needed for wheezing or shortness of breath., Disp: 18 g, Rfl: 1   Steffanie Dunn, DO [facility] 01/26/2019 12:18 PM

## 2019-01-26 NOTE — Patient Instructions (Addendum)
Thank you for visiting Dr. Carlis Abbott at [facility]. We recommend the following: Orders Placed This Encounter  Procedures  . Pulmonary function test   Orders Placed This Encounter  Procedures  . Pulmonary function test    Standing Status:   Future    Standing Expiration Date:   01/26/2020    Order Specific Question:   Where should this test be performed?    Answer:   [facility]    Order Specific Question:   Full PFT: includes the following: basic spirometry, spirometry pre & post bronchodilator, diffusion capacity (DLCO), lung volumes    Answer:   Full PFT    Meds ordered this encounter  Medications  . albuterol (VENTOLIN HFA) 108 (90 Base) MCG/ACT inhaler    Sig: Inhale 2 puffs into the lungs every 6 (six) hours as needed for wheezing or shortness of breath.    Dispense:  18 g    Refill:  1    Return in about 2 months (around 03/29/2019).    Please do your part to reduce the spread of COVID-19.

## 2019-03-13 ENCOUNTER — Ambulatory Visit (INDEPENDENT_AMBULATORY_CARE_PROVIDER_SITE_OTHER): Payer: 59 | Admitting: Sports Medicine

## 2019-03-13 ENCOUNTER — Other Ambulatory Visit: Payer: Self-pay

## 2019-03-13 VITALS — BP 184/69 | Ht 65.0 in | Wt 316.0 lb

## 2019-03-13 DIAGNOSIS — M25511 Pain in right shoulder: Secondary | ICD-10-CM | POA: Diagnosis not present

## 2019-03-13 DIAGNOSIS — G8929 Other chronic pain: Secondary | ICD-10-CM

## 2019-03-13 DIAGNOSIS — M25512 Pain in left shoulder: Secondary | ICD-10-CM | POA: Diagnosis not present

## 2019-03-13 MED ORDER — METHYLPREDNISOLONE ACETATE 40 MG/ML IJ SUSP
40.0000 mg | Freq: Once | INTRAMUSCULAR | Status: AC
  Administered 2019-03-13: 15:00:00 40 mg via INTRA_ARTICULAR

## 2019-03-13 MED ORDER — METHYLPREDNISOLONE ACETATE 40 MG/ML IJ SUSP
40.0000 mg | Freq: Once | INTRAMUSCULAR | Status: AC
  Administered 2019-03-13: 40 mg via INTRA_ARTICULAR

## 2019-03-14 ENCOUNTER — Encounter: Payer: Self-pay | Admitting: Sports Medicine

## 2019-03-14 NOTE — Progress Notes (Signed)
   Subjective:    Patient ID: [name] [name], female    DOB: 1951/01/04, 69 y.o.   MRN: [id number]  HPI chief complaint: Bilateral shoulder pain  69 year old female with known history of bilateral glenohumeral and acromioclavicular DJD comes in today requesting repeat cortisone injections. Last injections were done several months ago. They did provide some temporary pain relief for her. She denies any recent trauma. Pain is diffuse around both shoulders. Worse with activities such as cooking. She denies numbness or tingling.  Interim medical history reviewed Medications reviewed Allergies reviewed    Review of Systems    As above Objective:   Physical Exam  Well-developed, well-nourished. No acute distress. Awake alert and oriented x3. Vital signs reviewed  Examination of both shoulder shows good range of motion. She is diffusely tender to palpation but nothing focal. Rotator cuff strength is 5/5. Neurovascular intact distally.  X-ray of the right shoulder done in September 2019 and left shoulder x-ray done in September 2020 both show advanced glenohumeral DJD as well as degenerative changes at the acromioclavicular joint.      Assessment & Plan:  Bilateral shoulder pain secondary to DJD  Patient would like to proceed with repeat cortisone injections. These were administered today after risks and benefits were explained. Patient tolerated this without difficulty. Follow-up as needed.  Consent obtained and verified. Time-out conducted. Noted no overlying erythema, induration, or other signs of local infection. Skin prepped in a sterile fashion. Topical analgesic spray: Ethyl chloride. Joint: right shoulder Needle: 25g 1.5 inch Completed without difficulty. Meds: 3cc 1% xylocaine. 1cc (40mg ) depomedrol  Advised to call if fevers/chills, erythema, induration, drainage, or persistent bleeding.  Consent obtained and verified. Time-out conducted. Noted no overlying erythema,  induration, or other signs of local infection. Skin prepped in a sterile fashion. Topical analgesic spray: Ethyl chloride. Joint: left shoulder Needle: 25g 1.5 inch Completed without difficulty. Meds: 3cc 1% xylocaine, 1cc (40mg ) depomedrol  Advised to call if fevers/chills, erythema, induration, drainage, or persistent bleeding.

## 2019-04-04 ENCOUNTER — Ambulatory Visit: Payer: 59 | Admitting: Critical Care Medicine

## 2019-04-04 NOTE — Progress Notes (Deleted)
Synopsis: Referred in December 2020 for shortness of breath by Laurann Montana, MD.  Subjective:   PATIENT ID: [name] [name] GENDER: female DOB: Dec 25, 1950, MRN: [id number]  No chief complaint on file.   HPI   No PFTS*** Declined flu shot previously Started albuterol PRN rec wt loss, dietary modifications  OV 01/26/2019: [name] [name] is a 69 year old woman who presents for evaluation of dyspnea on exertion that has been ongoing for several years.  It is been stable over this time.  She states that it started after having her most recent right knee surgery.  She had to have multiple revisions of a total knee arthroplasty on the right, but had done well after her left knee arthroplasty about 10 years ago.  It was first noticed that she had dyspnea on exertion when working visit with physical therapy.  She occasionally has wheezing and coughing spells, but not frequently.  She denies sputum production.  She quit smoking about 25 years ago after 12 years x 0.5 ppd.  Prior to her most recent knee surgery she was very active without any activity limitations, including being able to cut her own grass.  Now she can hardly stand to wash the dishes for 5 minutes or walk outside of her house.  She has no shortness of breath at rest or when sleeping.  She was previously diagnosed with OSA, but was not able to tolerate CPAP.  She reports that she has gained weight in the last few years.  She has tried multiple detox type diets without success in the past.  When she was in a skilled nursing unit for several weeks following her knee surgery she lost 45 pounds because she disliked with food and mostly ate salad.  She has found it hard to lose weight since then and reports that she gained 3 pounds this week.  In addition to dyspnea on exertion she is limited in her activity due to right knee pain and back pain.  She walks with a walker.  She denies a history of childhood asthma or family history of lung disease.   She is not interested in having flu or pneumonia vaccinations.    Past Medical History:  Diagnosis Date  . Arthritis    "right knee" (07/02/2015)  . 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 Diabetes (HCC)    type II  . Hyperlipidemia   . Hypertension   . Obesity   . OSA (obstructive sleep apnea)   . Overactive bladder   . S/P right knee arthroscopy    with septic joint growing out MSSA  . Septic arthritis of knee, right (HCC)   . Wears glasses      Family History  Problem Relation Age of Onset  . Breast cancer Mother   . Stroke Father   . Hypertension Other      Past Surgical History:  Procedure Laterality Date  . COLONOSCOPY    . DIAGNOSTIC LAPAROSCOPY    . JOINT REPLACEMENT    . KNEE ARTHROSCOPY Right 12/09/2012   Procedure: ARTHROSCOPIC IRRIGATION AND DEBRIDEMENT RIGHT KNEE;  Surgeon: Budd Palmer, MD;  Location: [facility] OR;  Service: Orthopedics;  Laterality: Right;  . LEFT HEART CATH AND CORONARY ANGIOGRAPHY N/A 09/02/2016   Procedure: Left Heart Cath and Coronary Angiography;  Surgeon: Rinaldo Cloud, MD;  Location: [facility] INVASIVE CV LAB;  Service: Cardiovascular;  Laterality: N/A;  . 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 TOTAL KNEE ARTHROPLASTY Bilateral 2011-2012   left-right  . TOTAL KNEE REVISION Right 11/06/2014   Procedure: REMOVAL OF TOTAL COMPONENTS, EXTENSIVE  IRRIGATION AND DEBRIDEMENT  RIGHT KNEE;  Surgeon: Ninetta Lights, MD;  Location: [place];  Service: Orthopedics;  Laterality: Right;  . TOTAL KNEE REVISION Right 07/02/2015  . TOTAL KNEE REVISION Right 07/02/2015   Procedure: TOTAL KNEE REVISION;  Surgeon: Ninetta Lights, MD;  Location: [place];  Service: Orthopedics;  Laterality: Right;  . TUBAL LIGATION  ~ 1980  . tummy tuck  1980s    Social History   Socioeconomic History  .  Marital status: Divorced    Spouse name: Not on file  . Number of children: Not on file  . Years of education: Not on file  . Highest education level: Not on file  Occupational History  . Not on file  Tobacco Use  . Smoking status: Former Smoker    Packs/day: 0.50    Years: 12.00    Pack years: 6.00    Types: Cigarettes    Quit date: 08/01/1992    Years since quitting: 26.6  . Smokeless tobacco: Never Used  Substance and Sexual Activity  . Alcohol use: No  . Drug use: No  . Sexual activity: Never  Other Topics Concern  . Not on file  Social History Narrative  . Not on file   Social Determinants of Health   Financial Resource Strain:   . Difficulty of Paying Living Expenses: Not on file  Food Insecurity:   . Worried About Charity fundraiser in the Last Year: Not on file  . Ran Out of Food in the Last Year: Not on file  Transportation Needs:   . Lack of Transportation (Medical): Not on file  . Lack of Transportation (Non-Medical): Not on file  Physical Activity:   . Days of Exercise per Week: Not on file  . Minutes of Exercise per Session: Not on file  Stress:   . Feeling of Stress : Not on file  Social Connections:   . Frequency of Communication with Friends and Family: Not on file  . Frequency of Social Gatherings with Friends and Family: Not on file  . Attends Religious Services: Not on file  . Active Member of Clubs or Organizations: Not on file  . Attends Archivist Meetings: Not on file  . Marital Status: Not on file  Intimate Partner Violence:   . Fear of Current or Ex-Partner: Not on file  . Emotionally Abused: Not on file  . Physically Abused: Not on file  . Sexually Abused: Not on file     No Known Allergies    There is no immunization history on file for this patient.  Outpatient Medications Prior to Visit  Medication Sig Dispense Refill  . albuterol (VENTOLIN HFA) 108 (90 Base) MCG/ACT inhaler Inhale 2 puffs into the lungs every 6 (six)  hours as needed for wheezing or shortness of breath. 18 g 1  . amoxicillin (AMOXIL) 500 MG capsule as needed.     [name] Kitchen BYSTOLIC 10 MG tablet TAKE ONE TABLET BY MOUTH ONCE DAILY FOR 30 DAYS    . chlorhexidine (PERIDEX) 0.12 % solution 15 mLs by Mouth Rinse route daily.    . clopidogrel (PLAVIX) 75 MG tablet Take 1 tablet (75 mg total) by mouth daily. 30 tablet 0  .  losartan (COZAAR) 25 MG tablet 50 mg.     . MYRBETRIQ 50 MG TB24 tablet Take 50 mg by mouth daily.  11  . nitroGLYCERIN (NITROSTAT) 0.4 MG SL tablet Place 0.4 mg under the tongue every 5 (five) minutes x 3 doses as needed. For chest pain.  3  . trimethoprim (TRIMPEX) 100 MG tablet Take 100 mg by mouth daily.     No facility-administered medications prior to visit.    Review of Systems  Constitutional: Negative for chills, fever and weight loss.  HENT: Negative for congestion and sore throat.   Eyes: Negative.   Respiratory: Positive for cough, shortness of breath and wheezing. Negative for sputum production.   Cardiovascular: Negative for chest pain and leg swelling.  Gastrointestinal: Negative for blood in stool, diarrhea, heartburn, nausea and vomiting.  Genitourinary: Negative.   Musculoskeletal: Positive for back pain and joint pain. Negative for myalgias.  Skin: Negative for rash.  Neurological: Negative for dizziness.       Walks with a cane due to knee pain and instability  Endo/Heme/Allergies: Negative for environmental allergies.     Objective:   There were no vitals filed for this visit.   on   RA BMI Readings from Last 3 Encounters:  03/13/19 52.59 kg/m  01/26/19 54.31 kg/m  12/27/18 53.86 kg/m   Wt Readings from Last 3 Encounters:  03/13/19 (!) 316 lb (143.3 kg)  01/26/19 (!) 316 lb 6.4 oz (143.5 kg)  12/27/18 (!) 313 lb 12.8 oz (142.3 kg)    Physical Exam   CBC    Component Value Date/Time   WBC 6.2 03/03/2018 1142   WBC 7.2 10/06/2017 0042   RBC 5.48 (H) 03/03/2018 1142   RBC 5.30 (H)  10/06/2017 0859   RBC 5.25 (H) 10/06/2017 0042   HGB 12.5 03/03/2018 1142   HCT 40.6 03/03/2018 1142   PLT 297 03/03/2018 1142   MCV 74 (L) 03/03/2018 1142   MCH 22.8 (L) 03/03/2018 1142   MCH 22.1 (L) 10/06/2017 0042   MCHC 30.8 (L) 03/03/2018 1142   MCHC 28.5 (L) 10/06/2017 0042   RDW 16.6 (H) 03/03/2018 1142   LYMPHSABS 2.3 10/06/2017 0042   MONOABS 0.8 10/06/2017 0042   EOSABS 0.1 10/06/2017 0042   BASOSABS 0.1 10/06/2017 0042     Chest Imaging- films reviewed: CXR, 2 view 10/06/2017-increased interstitial markings, B-lines, mild cephalization of flow.  Posterior diaphragmatic eventration.  Pulmonary Functions Testing Results: No flowsheet data found.   Echocardiogram 10/06/2017: LVEF 55 to 60%, mild LVH.  Normal LA. RV and RA not well seen.  Trivial MR and TR, normal aortic valve.     Assessment & Plan:   No diagnosis found.   Dyspnea on exertion-likely multifactorial with restriction due to obesity and deconditioning likely playing a role.  It is unclear if she potentially has mild COPD in addition, although her smoking history is likely minimal. -Walked in office to observe for desaturation -PFTs -Albuterol as needed -We spent a significant amount of time discussing the importance of modest weight loss and dietary modifications to support these efforts.  I recommended more fresh vegetables and resuming the type of diet that worked when she was in the nursing home. -Recommend regular physical activity, including doing exercises with her arms when she is sitting down if that helps her knee and back pain. -Recommend seasonal flu and pneumonia vaccinations, which she declined. -Continue Covid precautions-social distancing, mask wearing, handwashing   RTC in 2 months after PFTs.  Current Outpatient Medications:  .  albuterol (VENTOLIN HFA) 108 (90 Base) MCG/ACT inhaler, Inhale 2 puffs into the lungs every 6 (six) hours as needed for wheezing or shortness of breath.,  Disp: 18 g, Rfl: 1 .  amoxicillin (AMOXIL) 500 MG capsule, as needed. , Disp: , Rfl:  .  BYSTOLIC 10 MG tablet, TAKE ONE TABLET BY MOUTH ONCE DAILY FOR 30 DAYS, Disp: , Rfl:  .  chlorhexidine (PERIDEX) 0.12 % solution, 15 mLs by Mouth Rinse route daily., Disp: , Rfl:  .  clopidogrel (PLAVIX) 75 MG tablet, Take 1 tablet (75 mg total) by mouth daily., Disp: 30 tablet, Rfl: 0 .  losartan (COZAAR) 25 MG tablet, 50 mg. , Disp: , Rfl:  .  MYRBETRIQ 50 MG TB24 tablet, Take 50 mg by mouth daily., Disp: , Rfl: 11 .  nitroGLYCERIN (NITROSTAT) 0.4 MG SL tablet, Place 0.4 mg under the tongue every 5 (five) minutes x 3 doses as needed. For chest pain., Disp: , Rfl: 3 .  trimethoprim (TRIMPEX) 100 MG tablet, Take 100 mg by mouth daily., Disp: , Rfl:    Steffanie Dunn, DO  Pulmonary Critical Care 04/04/2019 7:39 AM

## 2019-04-10 ENCOUNTER — Encounter: Payer: Self-pay | Admitting: Critical Care Medicine

## 2019-04-10 ENCOUNTER — Ambulatory Visit (INDEPENDENT_AMBULATORY_CARE_PROVIDER_SITE_OTHER): Payer: 59 | Admitting: Critical Care Medicine

## 2019-04-10 ENCOUNTER — Other Ambulatory Visit: Payer: Self-pay

## 2019-04-10 DIAGNOSIS — R0609 Other forms of dyspnea: Secondary | ICD-10-CM

## 2019-04-10 DIAGNOSIS — R0600 Dyspnea, unspecified: Secondary | ICD-10-CM | POA: Diagnosis not present

## 2019-04-10 NOTE — Progress Notes (Signed)
Synopsis: Referred in December 2020 for shortness of breath by Laurann Montana, MD.  Subjective:   PATIENT ID: [name] [name] GENDER: female DOB: Feb 05, 1951, MRN: [id number]  Chief Complaint  Patient presents with  . Follow-up    Patient states that her breathing is about the same since last visit. Patient still has shortness of breath with exertion but thinks that it is due to her weight.     Virtual Visit via Telephone Note  I connected with [name] [name] on 04/10/19 at  2:45 PM EST by telephone and verified that I am speaking with the correct person using two identifiers.  Location: Patient: home Provider: [facility] office, [address] [place], [place]   I discussed the limitations, risks, security and privacy concerns of performing an evaluation and management service by telephone and the availability of in person appointments. I also discussed with the patient that there may be a patient responsible charge related to this service. The patient expressed understanding and agreed to proceed.   I discussed the assessment and treatment plan with the patient. The patient was provided an opportunity to ask questions and all were answered. The patient agreed with the plan and demonstrated an understanding of the instructions.   The patient was advised to call back or seek an in-person evaluation if the symptoms worsen or if the condition fails to improve as anticipated.  I provided 17 minutes of non-face-to-face time during this encounter.    [name] [name] is a 69 year old woman who follows up for dyspnea on exertion.  She attributes this to weight gain over the last several years.  She declined to have PFTs performed due to the Covid testing requirement.  She has no interest in having this test done.  She feels that her breathing is at her baseline.  She uses albuterol a few times a day, but is not sure that it helps she cannot tell a difference if she misses doses.  She  has tried to lose weight in the past unsuccessfully.  She is interested in pursuing bariatric surgery.  She has orthopedic issues that limit her physical activity.  She is working on cutting back on how much she eats and on incorporating more vegetables into her diet.     OV 01/26/2019: [name] [name] is a 69 year old woman who presents for evaluation of dyspnea on exertion that has been ongoing for several years.  It is been stable over this time.  She states that it started after having her most recent right knee surgery.  She had to have multiple revisions of a total knee arthroplasty on the right, but had done well after her left knee arthroplasty about 10 years ago.  It was first noticed that she had dyspnea on exertion when working visit with physical therapy.  She occasionally has wheezing and coughing spells, but not frequently.  She denies sputum production.  She quit smoking about 25 years ago after 12 years x 0.5 ppd.  Prior to her most recent knee surgery she was very active without any activity limitations, including being able to cut her own grass.  Now she can hardly stand to wash the dishes for 5 minutes or walk outside of her house.  She has no shortness of breath at rest or when sleeping.  She was previously diagnosed with OSA, but was not able to tolerate CPAP.  She reports that she has gained weight in the last few years.  She has tried multiple detox type diets  without success in the past.  When she was in a skilled nursing unit for several weeks following her knee surgery she lost 45 pounds because she disliked with food and mostly ate salad.  She has found it hard to lose weight since then and reports that she gained 3 pounds this week.  In addition to dyspnea on exertion she is limited in her activity due to right knee pain and back pain.  She walks with a walker.  She denies a history of childhood asthma or family history of lung disease.  She is not interested in having flu or pneumonia  vaccinations.    Past Medical History:  Diagnosis Date  . Arthritis    "right knee" (07/02/2015)  . 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 Diabetes (HCC)    type II  . Hyperlipidemia   . Hypertension   . Obesity   . OSA (obstructive sleep apnea)   . Overactive bladder   . S/P right knee arthroscopy    with septic joint growing out MSSA  . Septic arthritis of knee, right (HCC)   . Wears glasses      Family History  Problem Relation Age of Onset  . Breast cancer Mother   . Stroke Father   . Hypertension Other      Past Surgical History:  Procedure Laterality Date  . COLONOSCOPY    . DIAGNOSTIC LAPAROSCOPY    . JOINT REPLACEMENT    . KNEE ARTHROSCOPY Right 12/09/2012   Procedure: ARTHROSCOPIC IRRIGATION AND DEBRIDEMENT RIGHT KNEE;  Surgeon: Budd Palmer, MD;  Location: [facility] OR;  Service: Orthopedics;  Laterality: Right;  . LEFT HEART CATH AND CORONARY ANGIOGRAPHY N/A 09/02/2016   Procedure: Left Heart Cath and Coronary Angiography;  Surgeon: Rinaldo Cloud, MD;  Location: [facility] INVASIVE CV LAB;  Service: Cardiovascular;  Laterality: N/A;  . 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/06/2014   Procedure: PLACEMENT OF CEMENT SPACER;  Surgeon: Loreta Ave, MD;  Location: [facility] OR;  Service: Orthopedics;  Laterality: Right;  . TOTAL KNEE ARTHROPLASTY Bilateral 2011-2012   left-right  . TOTAL KNEE REVISION Right 11/06/2014   Procedure: REMOVAL OF TOTAL COMPONENTS, EXTENSIVE  IRRIGATION AND DEBRIDEMENT  RIGHT KNEE;  Surgeon: Loreta Ave, MD;  Location: [facility] OR;  Service: Orthopedics;  Laterality: Right;  . TOTAL KNEE REVISION Right 07/02/2015  . TOTAL KNEE REVISION Right 07/02/2015   Procedure: TOTAL KNEE REVISION;  Surgeon: Loreta Ave, MD;  Location: [facility] OR;  Service: Orthopedics;  Laterality: Right;  . TUBAL LIGATION  ~ 1980  . tummy tuck  1980s    Social History   Socioeconomic History  . Marital status: Divorced    Spouse name: Not on file    . Number of children: Not on file  . Years of education: Not on file  . Highest education level: Not on file  Occupational History  . Not on file  Tobacco Use  . Smoking status: Former Smoker    Packs/day: 0.50    Years: 12.00    Pack years: 6.00    Types: Cigarettes    Quit date: 08/01/1992    Years since quitting: 26.7  . Smokeless tobacco: Never Used  Substance and Sexual Activity  . Alcohol use: No  . Drug use: No  . Sexual activity: Never  Other Topics Concern  . Not on file  Social History Narrative  . Not on file   Social Determinants of  Health   Financial Resource Strain:   . Difficulty of Paying Living Expenses: Not on file  Food Insecurity:   . Worried About Programme researcher, broadcasting/film/video in the Last Year: Not on file  . Ran Out of Food in the Last Year: Not on file  Transportation Needs:   . Lack of Transportation (Medical): Not on file  . Lack of Transportation (Non-Medical): Not on file  Physical Activity:   . Days of Exercise per Week: Not on file  . Minutes of Exercise per Session: Not on file  Stress:   . Feeling of Stress : Not on file  Social Connections:   . Frequency of Communication with Friends and Family: Not on file  . Frequency of Social Gatherings with Friends and Family: Not on file  . Attends Religious Services: Not on file  . Active Member of Clubs or Organizations: Not on file  . Attends Banker Meetings: Not on file  . Marital Status: Not on file  Intimate Partner Violence:   . Fear of Current or Ex-Partner: Not on file  . Emotionally Abused: Not on file  . Physically Abused: Not on file  . Sexually Abused: Not on file     No Known Allergies    There is no immunization history on file for this patient.  Outpatient Medications Prior to Visit  Medication Sig Dispense Refill  . albuterol (VENTOLIN HFA) 108 (90 Base) MCG/ACT inhaler Inhale 2 puffs into the lungs every 6 (six) hours as needed for wheezing or shortness of breath.  18 g 1  . amoxicillin (AMOXIL) 500 MG capsule as needed.     [name] Kitchen BYSTOLIC 10 MG tablet TAKE ONE TABLET BY MOUTH ONCE DAILY FOR 30 DAYS    . chlorhexidine (PERIDEX) 0.12 % solution 15 mLs by Mouth Rinse route daily.    . clopidogrel (PLAVIX) 75 MG tablet Take 1 tablet (75 mg total) by mouth daily. 30 tablet 0  . losartan (COZAAR) 25 MG tablet 50 mg.     . nitroGLYCERIN (NITROSTAT) 0.4 MG SL tablet Place 0.4 mg under the tongue every 5 (five) minutes x 3 doses as needed. For chest pain.  3  . trimethoprim (TRIMPEX) 100 MG tablet Take 100 mg by mouth daily.    . TROSPIUM CHLORIDE PO Take 1 tablet by mouth in the morning and at bedtime.    [name] Kitchen MYRBETRIQ 50 MG TB24 tablet Take 50 mg by mouth daily.  11   No facility-administered medications prior to visit.    Review of Systems  Constitutional: Negative for chills, fever and weight loss.  HENT: Negative for congestion and sore throat.   Eyes: Negative.   Respiratory: Positive for cough, shortness of breath and wheezing. Negative for sputum production.   Cardiovascular: Negative for chest pain and leg swelling.  Gastrointestinal: Negative for blood in stool, diarrhea, heartburn, nausea and vomiting.  Genitourinary: Negative.   Musculoskeletal: Positive for back pain and joint pain. Negative for myalgias.  Skin: Negative for rash.  Neurological: Negative for dizziness.       Walks with a cane due to knee pain and instability  Endo/Heme/Allergies: Negative for environmental allergies.     Objective:   There were no vitals filed for this visit.   on   RA BMI Readings from Last 3 Encounters:  03/13/19 52.59 kg/m  01/26/19 54.31 kg/m  12/27/18 53.86 kg/m   Wt Readings from Last 3 Encounters:  03/13/19 (!) 316 lb (143.3 kg)  01/26/19 [name] Kitchen)  316 lb 6.4 oz (143.5 kg)  12/27/18 (!) 313 lb 12.8 oz (142.3 kg)    Physical Exam- Limited due to virtual visit. No conversational dyspnea. Answering questions appropriately.   CBC    Component  Value Date/Time   WBC 6.2 03/03/2018 1142   WBC 7.2 10/06/2017 0042   RBC 5.48 (H) 03/03/2018 1142   RBC 5.30 (H) 10/06/2017 0859   RBC 5.25 (H) 10/06/2017 0042   HGB 12.5 03/03/2018 1142   HCT 40.6 03/03/2018 1142   PLT 297 03/03/2018 1142   MCV 74 (L) 03/03/2018 1142   MCH 22.8 (L) 03/03/2018 1142   MCH 22.1 (L) 10/06/2017 0042   MCHC 30.8 (L) 03/03/2018 1142   MCHC 28.5 (L) 10/06/2017 0042   RDW 16.6 (H) 03/03/2018 1142   LYMPHSABS 2.3 10/06/2017 0042   MONOABS 0.8 10/06/2017 0042   EOSABS 0.1 10/06/2017 0042   BASOSABS 0.1 10/06/2017 0042     Chest Imaging- films reviewed: CXR, 2 view 10/06/2017-increased interstitial markings, B-lines, mild cephalization of flow.  Posterior diaphragmatic eventration.  Pulmonary Functions Testing Results: No flowsheet data found.   Echocardiogram 10/06/2017: LVEF 55 to 60%, mild LVH.  Normal LA. RV and RA not well seen.  Trivial MR and TR, normal aortic valve.     Assessment & Plan:     ICD-10-CM   1. DOE (dyspnea on exertion)  R06.00      Dyspnea on exertion-likely multifactorial with restriction due to obesity and deconditioning likely playing a role.  -PFTs declined 2/2 covid testing requirement; she attributes her symptoms to her weight. -Okay to continue Albuterol as needed -Encouraged her to have PFTs performed, which she is refusing despite education that the Covid test is a [place] requirement.  She will let us know after Covid is over if she would like to pursue PFTs. -Encouraged her ongoing weight loss efforts and reinforced the importance of long-term maintenance of a healthy weight. -Previously declined recommend seasonal flu and pneumonia vaccinations, which she declined. -Con't covid precautions- social distancing, hand washing, mask wearing.   RTC PRN.   Current Outpatient Medications:  .  albuterol (VENTOLIN HFA) 108 (90 Base) MCG/ACT inhaler, Inhale 2 puffs into the lungs every 6 (six) hours as needed for  wheezing or shortness of breath., Disp: 18 g, Rfl: 1 .  amoxicillin (AMOXIL) 500 MG capsule, as needed. , Disp: , Rfl:  .  BYSTOLIC 10 MG tablet, TAKE ONE TABLET BY MOUTH ONCE DAILY FOR 30 DAYS, Disp: , Rfl:  .  chlorhexidine (PERIDEX) 0.12 % solution, 15 mLs by Mouth Rinse route daily., Disp: , Rfl:  .  clopidogrel (PLAVIX) 75 MG tablet, Take 1 tablet (75 mg total) by mouth daily., Disp: 30 tablet, Rfl: 0 .  losartan (COZAAR) 25 MG tablet, 50 mg. , Disp: , Rfl:  .  nitroGLYCERIN (NITROSTAT) 0.4 MG SL tablet, Place 0.4 mg under the tongue every 5 (five) minutes x 3 doses as needed. For chest pain., Disp: , Rfl: 3 .  trimethoprim (TRIMPEX) 100 MG tablet, Take 100 mg by mouth daily., Disp: , Rfl:  .  TROSPIUM CHLORIDE PO, Take 1 tablet by mouth in the morning and at bedtime., Disp: , Rfl:    Julian Hy, DO [facility] 04/10/2019 3:57 PM

## 2019-04-10 NOTE — Patient Instructions (Addendum)
Thank you for visiting Dr. Chestine Spore at [facility]. We recommend the following:  Keep working on losing weight- I agree with working on changing your eating habits and increasing your vegetables.  You can call us back after Covid is over to schedule your PFTs when covid testing is no longer required prior to testing.   Return if symptoms worsen or fail to improve.    Please do your part to reduce the spread of COVID-19.

## 2019-06-25 ENCOUNTER — Other Ambulatory Visit: Payer: Self-pay | Admitting: Family Medicine

## 2019-06-25 DIAGNOSIS — Z1231 Encounter for screening mammogram for malignant neoplasm of breast: Secondary | ICD-10-CM

## 2019-06-26 ENCOUNTER — Encounter: Payer: Self-pay | Admitting: Adult Health

## 2019-06-26 ENCOUNTER — Ambulatory Visit (INDEPENDENT_AMBULATORY_CARE_PROVIDER_SITE_OTHER): Payer: 59 | Admitting: Adult Health

## 2019-06-26 ENCOUNTER — Other Ambulatory Visit: Payer: Self-pay

## 2019-06-26 VITALS — BP 175/84 | HR 86 | Temp 96.9°F | Ht 60.0 in | Wt 314.0 lb

## 2019-06-26 DIAGNOSIS — E782 Mixed hyperlipidemia: Secondary | ICD-10-CM

## 2019-06-26 DIAGNOSIS — R7303 Prediabetes: Secondary | ICD-10-CM

## 2019-06-26 DIAGNOSIS — Z8673 Personal history of transient ischemic attack (TIA), and cerebral infarction without residual deficits: Secondary | ICD-10-CM

## 2019-06-26 DIAGNOSIS — I10 Essential (primary) hypertension: Secondary | ICD-10-CM

## 2019-06-26 NOTE — Progress Notes (Signed)
I agree with the above plan 

## 2019-06-26 NOTE — Progress Notes (Signed)
[facility] [address] [place]. [place] [address] [phone]       OFFICE FOLLOW UP NOTE  [name] Date of Birth:  20-Jan-1951 Medical Record Number:  [id number]   Reason for visit: Right BG stroke follow-up visit  CHIEF COMPLAINT:  Chief Complaint  Patient presents with  . Follow-up    rm 9 alone, CVA    HPI:  Today, 06/26/2019, [name] returns for stroke follow-up.  She has been stable from a stroke standpoint without new or reoccurring stroke/TIA symptoms.  No residual stroke deficits.  Continues on clopidogrel and Lovaza for secondary stroke prevention.  History of statin intolerance.  Blood pressure today 175/84.  Monitors at home occasionally and typically 140-150/70-80.  Prior diagnosis of sleep apnea but intolerant to CPAP.  Chronic left temporal headache with initiation of topiramate at prior visit the patient is unsure if she trial this.  Headaches are not debilitating and typically subside on her own.  She does report worsening vision and has follow-up with ophthalmology in the near future and questions if headaches are related to eyestrain.  She continues to follow with pain management routinely for ongoing chronic pain concerns which limits mobility.  No stroke related concerns at this time.    History provided for reference purposes only Update 12/27/2018 JM: Ms. [name] is being seen today for stroke follow-up.  She has been stable from a stroke standpoint without reoccurring or new stroke/TIA symptoms.  She does not have residual deficits.  She is not currently on statin medications as she was taking pravastatin and due to increase myalgias potentially statin related, orthopedic provider recommended trialing discontinuing for a 4-day duration and did experience improvement of her pains.  She tried Crestor but unfortunately also unable to tolerate.  She states recent PCP lipid panel satisfactory.  She does endorse continuation of Plavix  without bleeding or bruising. Blood pressure continues to be uncontrolled with today's reading 162/78.  She continues to work with her PCP for management but either experiences side effects or refuses to take medications.  She also has been diagnosed with sleep apnea with recommended CPAP but only used for 1 month and self discontinued due to intolerance.  She has not followed up with her sleep provider since that time.  She also continues to experience right-sided temporal/frontal headaches that are typically more present in the morning or with increased stress.  Denies associated migrainous symptoms.  Continues to be followed by sports medicine with recent injection in right knee with improvement in pain.  Ambulates short distance with cane but requested a wheelchair to be brought to exam room due to increased pain with long distance.  She does endorse 17 pound weight loss recently.  No further concerns at this time.  Interval history 03/03/2018 JM: Patient is being seen today for 32-month follow-up visit and is accompanied by her granddaughter.  Overall, she has been stable from a stroke stand point without residual deficits.  She has been participating in water aerobics for knee and back pain. She did have sleep study which showed severe oxygen desaturation and moderate obstructive sleep apnea.  Recommended CPAP titration or auto adjusting CPAP. She does have to go back for a CPAP titration 03/14/18. She has been having a difficult time with her medications due to side effects of leg pain, back pain and numbness. She states when she doesn't take any of her medications, she does not experience these symptoms.  She also has stopped all of  her blood pressure medications and has been substituting antihypertensives with CBD oil and does feel as though she has had improvement of her blood pressure.  Blood pressure today 178/81 and she states this is due to her walking into the office.  She does continue to take Plavix  intermittently without side effects of bleeding or bruising.  She does continue to take atorvastatin intermittently and questioning whether she is experiencing statin side effects.  She also endorses infrequent headaches with pressure behind her right eye and pressure on the top of her head.  She denies photophobia, phonophobia or nausea/vomiting.  She denies history of headaches or migraines.  She does endorse her granddaughter has history of migraines.  She denies these headaches to be debilitating and will typically resolve on their own.  These headaches occur possibly once per month.  These have been present for the past year.  No further concerns at this time.  Denies new or worsening stroke/TIA symptoms.  11/24/2017 visit JM: Patient is being seen today for hospital follow-up and is accompanied by her granddaughter.  She resided at [facility] for 1 week and then was discharged home without therapy needs.  She is currently living with her daughter and grandchildren as she was unable to return to her home due to black mold.  She does plan on obtaining her own place soon to live independently.  She does continue to have residual left fine motor control weakness but this has been improving.  Denies any other residual deficits or recurring of symptoms.  She is currently sitting in wheelchair as she uses this for long distance due to increased shortness of breath with ambulation along with right knee pain which is chronic.  Patient will use a cane for short distance.  She has completed 3 weeks DAPT and currently on Plavix alone without side effects of bleeding or bruising.  She continues on atorvastatin 80 mg daily without side effects of myalgias.  Blood pressure today 161/76 and as patient does monitor this at home, SBP typically ranges from 1 50-1 60.  She states she was previously on antihypertensives prior to admission but her blood pressure continued to be at this range despite use of antihypertensives.   She will be seeing a new PCP on 12/13/2017 and will speak to her regarding HTN management.  She also plans on speaking to her regarding OSA referral and work-up.  No further concerns at this time and denies new or worsening stroke/TIA symptoms.  Hospital admission 10/06/2017: Ms. Sumayyah Custodio is a 69 y.o. female with history of HTN, HLD, borderline DB,  who presented with L HP that started on Sunday, developed CP.  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  MRA showed narrowing of the right VA V4.  Carotid Doppler showed bilateral ICA stenosis of 1 to 39%.  2D echo showed an EF of 55 to 60% without cardiac source of embolus identified.  LDL 114 and is recommended to increase Lipitor to 80 mg daily.  HTN stable during admission and recommended long-term BP goal normotensive range.  A1c 6.2 and recommended continued follow-up and management for DM with PCP.  Previously on aspirin 81 mg and recommended DAPT for 3 weeks and Plavix alone.  It was determined that the stroke was likely secondary to small vessel disease due to multiple  vascular risk factors including morbid obesity, diabetes, hypertension and hyperlipidemia.  Was also recommended for patient to undergo sleep apnea work-up outpatient.  PT/OT recommended SNF placement and was discharged in stable condition to [facility].      ROS:   14 system review of systems performed and negative with exception of joint pain, headaches  PMH:  Past Medical History:  Diagnosis Date  . Arthritis    "right knee" (07/02/2015)  . 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 Diabetes (HCC)    type II  . Hyperlipidemia   . Hypertension   . Obesity   . OSA (obstructive sleep apnea)   . Overactive bladder   . S/P right knee arthroscopy    with  septic joint growing out MSSA  . Septic arthritis of knee, right (HCC)   . Wears glasses     PSH:  Past Surgical History:  Procedure Laterality Date  . COLONOSCOPY    . DIAGNOSTIC LAPAROSCOPY    . JOINT REPLACEMENT    . KNEE ARTHROSCOPY Right 12/09/2012   Procedure: ARTHROSCOPIC IRRIGATION AND DEBRIDEMENT RIGHT KNEE;  Surgeon: Budd Palmer, MD;  Location: [facility] OR;  Service: Orthopedics;  Laterality: Right;  . LEFT HEART CATH AND CORONARY ANGIOGRAPHY N/A 09/02/2016   Procedure: Left Heart Cath and Coronary Angiography;  Surgeon: Rinaldo Cloud, MD;  Location: [facility] INVASIVE CV LAB;  Service: Cardiovascular;  Laterality: N/A;  . 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/06/2014   Procedure: PLACEMENT OF CEMENT SPACER;  Surgeon: Loreta Ave, MD;  Location: [facility] OR;  Service: Orthopedics;  Laterality: Right;  . TOTAL KNEE ARTHROPLASTY Bilateral 2011-2012   left-right  . TOTAL KNEE REVISION Right 11/06/2014   Procedure: REMOVAL OF TOTAL COMPONENTS, EXTENSIVE  IRRIGATION AND DEBRIDEMENT  RIGHT KNEE;  Surgeon: Loreta Ave, MD;  Location: [facility] OR;  Service: Orthopedics;  Laterality: Right;  . TOTAL KNEE REVISION Right 07/02/2015  . TOTAL KNEE REVISION Right 07/02/2015   Procedure: TOTAL KNEE REVISION;  Surgeon: Loreta Ave, MD;  Location: [facility] OR;  Service: Orthopedics;  Laterality: Right;  . TUBAL LIGATION  ~ 1980  . tummy tuck  1980s    Social History:  Social History   Socioeconomic History  . Marital status: Divorced    Spouse name: Not on file  . Number of children: Not on file  . Years of education: Not on file  . Highest education level: Not on file  Occupational History  . Not on file  Tobacco Use  . Smoking status: Former Smoker    Packs/day: 0.50    Years: 12.00    Pack years: 6.00    Types: Cigarettes    Quit date: 08/01/1992    Years since quitting: 26.9  . Smokeless tobacco: Never Used  Substance and Sexual Activity  . Alcohol use: No  . Drug use: No  .  Sexual activity: Never  Other Topics Concern  . Not on file  Social History Narrative  . Not on file   Social Determinants of Health   Financial Resource Strain:   . Difficulty of Paying Living Expenses:   Food Insecurity:   . Worried About Programme researcher, broadcasting/film/video in the Last Year:   . Barista in the Last Year:   Transportation Needs:   . Freight forwarder (Medical):   [name] Kitchen Lack of Transportation (Non-Medical):   Physical Activity:   . Days of Exercise per Week:   .  Minutes of Exercise per Session:   Stress:   . Feeling of Stress :   Social Connections:   . Frequency of Communication with Friends and Family:   . Frequency of Social Gatherings with Friends and Family:   . Attends Religious Services:   . Active Member of Clubs or Organizations:   . Attends BankerClub or Organization Meetings:   [name] Kitchen. Marital Status:   Intimate Partner Violence:   . Fear of Current or Ex-Partner:   . Emotionally Abused:   [name] Kitchen. Physically Abused:   . Sexually Abused:     Family History:  Family History  Problem Relation Age of Onset  . Breast cancer Mother   . Stroke Father   . Hypertension Other     Medications:   Current Outpatient Medications on File Prior to Visit  Medication Sig Dispense Refill  . amoxicillin (AMOXIL) 500 MG capsule as needed.     [name] Kitchen. BYSTOLIC 10 MG tablet TAKE ONE TABLET BY MOUTH ONCE DAILY FOR 30 DAYS    . clopidogrel (PLAVIX) 75 MG tablet Take 1 tablet (75 mg total) by mouth daily. 30 tablet 0  . losartan (COZAAR) 25 MG tablet 50 mg.     . nitroGLYCERIN (NITROSTAT) 0.4 MG SL tablet Place 0.4 mg under the tongue every 5 (five) minutes x 3 doses as needed. For chest pain.  3   No current facility-administered medications on file prior to visit.    Allergies:  No Known Allergies   Physical Exam  Vitals:   06/26/19 0937  BP: (!) 175/84  Pulse: 86  Temp: (!) 96.9 F (36.1 C)  Weight: (!) 314 lb (142.4 kg)  Height: 5' (1.524 m)   Body mass index is 61.32  kg/m. No exam data present  General: Morbidly obese pleasant middle-aged African-American female, seated, in no evident distress Head: head normocephalic and atraumatic.   Neck: supple with no carotid or supraclavicular bruits Cardiovascular: regular rate and rhythm, no murmurs Musculoskeletal: no deformity Skin:  no rash/petichiae Vascular:  Normal pulses all extremities  Neurologic Exam Mental Status: Awake and fully alert. Fluent speech and language. Oriented to place and time. Recent and remote memory intact. Attention span, concentration and fund of knowledge appropriate. Mood and affect appropriate.  Cranial Nerves: Pupils equal, briskly reactive to light. Extraocular movements full without nystagmus. Visual fields full to confrontation. Hearing intact. Facial sensation intact. Face, tongue, palate moves normally and symmetrically.  Motor: Normal bulk and tone. Normal strength in all tested extremity muscles.  Sensory.: intact to touch , pinprick , position and vibratory sensation.  Coordination: Rapid alternating movements normal in all extremities. Finger-to-nose performed accurately bilaterally and heel-to-shin difficulty performing due to bilateral knee pain Gait and Station: Deferred as mobility limited 2/2 pain currently in wheelchair Reflexes: 1+ and symmetric. Toes downgoing.        ASSESSMENT: [name] HarborMyrtle Klapper is a 69 y.o. year old female here with left basal ganglia infarct on 10/06/2017 secondary to small vessel disease. Vascular risk factors include morbid obesity, HTN, HLD and DM.  Patient recently underwent sleep study with diagnosis of moderate OSA with initiation of CPAP.  She recovered well from a stroke standpoint without residual deficits.  Currently not compliant with CPAP due to intolerance.  Multiple complaints at today's visit including joint pain, ongoing chronic left temporal headaches and positional dizziness    PLAN: -Continue clopidogrel 75 mg daily and  Lovaza for secondary stroke prevention -Advised to follow-up with ophthalmology for further evaluation of visual  concerns which may be contributing to headaches.  Headaches are not debilitating at this time and have been stable without worsening therefore no indication to initiate therapy.  Also recommended checking blood pressure at headache onset as HTN may be contributing factor.  Ongoing follow-up and management defer to PCP -Advised to continue to follow with pain management for ongoing chronic pain -Advised to continue to follow PCP for HTN, HLD and DM management -Maintain strict control of hypertension with blood pressure goal below 130/90, diabetes with hemoglobin A1c goal below 6.5% and cholesterol with LDL cholesterol (bad cholesterol) goal below 70 mg/dL. I also advised the patient to eat a healthy diet with plenty of whole grains, cereals, fruits and vegetables, exercise regularly and maintain ideal body weight.  Overall stable from stroke standpoint without further recommendations and recommend follow-up as needed   I spent 23 minutes of face-to-face and non-face-to-face time with patient.  This included previsit chart review, lab review, study review, order entry, electronic health record documentation, patient education regarding prior stroke, importance of managing stroke risk factors and answered all questions to patient satisfaction   Frann Rider, [facility]  [facility] [address] [place], [place] [address]  Phone [phone] Fax [phone] Note: This document was prepared with digital dictation and possible smart phrase technology. Any transcriptional errors that result from this process are unintentional.

## 2019-06-26 NOTE — Patient Instructions (Signed)
Continue clopidogrel 75 mg daily  and lovaza for secondary stroke prevention  Continue to follow up with PCP regarding cholesterol, blood pressure and diabetes management   Establish care with new eye doctor in regards to visual concerns that may be contributing to headaches.  Also recommend checking blood pressure with headache onset  Continue to monitor blood pressure at home routinely to ensure satisfactory levels at home  Maintain strict control of hypertension with blood pressure goal below 130/90, diabetes with hemoglobin A1c goal below 6.5% and cholesterol with LDL cholesterol (bad cholesterol) goal below 70 mg/dL. I also advised the patient to eat a healthy diet with plenty of whole grains, cereals, fruits and vegetables, exercise regularly and maintain ideal body weight.  Overall stable from stroke standpoint and recommend follow-up as needed      Thank you for coming to see [place] at [facility]. I hope we have been able to provide you high quality care today.  You may receive a patient satisfaction survey over the next few weeks. We would appreciate your feedback and comments so that we may continue to improve ourselves and the health of our patients.

## 2019-08-09 ENCOUNTER — Other Ambulatory Visit: Payer: Self-pay

## 2019-08-09 DIAGNOSIS — M25512 Pain in left shoulder: Secondary | ICD-10-CM

## 2019-09-20 ENCOUNTER — Ambulatory Visit
Admission: RE | Admit: 2019-09-20 | Discharge: 2019-09-20 | Disposition: A | Discharge status: Home / Self Care | Payer: 59 | Source: Ambulatory Visit | Attending: Sports Medicine | Admitting: Sports Medicine

## 2019-09-20 DIAGNOSIS — M25512 Pain in left shoulder: Secondary | ICD-10-CM

## 2019-10-01 NOTE — Addendum Note (Signed)
Addended by: Rutha Bouchard E on: 10/01/2019 09:45 AM   Modules accepted: Orders

## 2019-10-05 ENCOUNTER — Ambulatory Visit
Admission: RE | Admit: 2019-10-05 | Discharge: 2019-10-05 | Disposition: A | Discharge status: Home / Self Care | Payer: 59 | Source: Ambulatory Visit | Attending: Family Medicine | Admitting: Family Medicine

## 2019-10-05 ENCOUNTER — Other Ambulatory Visit: Payer: Self-pay

## 2019-10-05 DIAGNOSIS — Z1231 Encounter for screening mammogram for malignant neoplasm of breast: Secondary | ICD-10-CM

## 2019-10-15 ENCOUNTER — Telehealth: Payer: Self-pay | Admitting: Sports Medicine

## 2019-10-15 NOTE — Telephone Encounter (Signed)
  I spoke with [facility] on the phone today after reviewing MRI findings of her left shoulder done last week.  The MRI shows evidence of moderate glenohumeral osteoarthritis with several intra-articular loose bodies.  Rotator cuff is intact.  I recommend consultation with Dr. Everardo Pacific to discuss treatment options.  I am not sure whether or not she would benefit from an arthroscopic surgery to remove the intra-articular loose bodies.  I will defer further work-up and treatment to the discretion of Dr. Everardo Pacific and the patient will follow up with me as needed.

## 2019-10-16 NOTE — Telephone Encounter (Signed)
Dr. Everardo Pacific - [facility] Appt: 10/19/19 @ 10:45 am. Pt is aware of appt.

## 2019-10-17 ENCOUNTER — Encounter: Payer: Self-pay | Admitting: Sports Medicine

## 2019-11-27 ENCOUNTER — Ambulatory Visit: Payer: 59 | Admitting: Sports Medicine

## 2020-02-19 ENCOUNTER — Ambulatory Visit: Payer: 59 | Admitting: Sports Medicine

## 2020-02-21 ENCOUNTER — Other Ambulatory Visit: Payer: Self-pay

## 2020-02-21 ENCOUNTER — Ambulatory Visit (INDEPENDENT_AMBULATORY_CARE_PROVIDER_SITE_OTHER): Payer: 59 | Admitting: Sports Medicine

## 2020-02-21 VITALS — BP 189/61 | Ht 65.0 in | Wt 309.0 lb

## 2020-02-21 DIAGNOSIS — M25511 Pain in right shoulder: Secondary | ICD-10-CM

## 2020-02-21 NOTE — Progress Notes (Signed)
Office Visit Note   Patient: [name] [name]           Date of Birth: October 11, 1950           MRN: [id number] Visit Date: 02/21/2020 Requested by: Laurann Montana, MD [phone] [place],  [place] [address] PCP: Laurann Montana, MD  Subjective: CC: Right Shoulder Pain  HPI: 70 year old female presenting to clinic today with concerns of approximately 2 weeks of right shoulder pain.  Patient states that shortly after Christmas she was lifting herself from her chair, and she felt a sudden pain in her right shoulder.  She states that this felt like a "stabbing" in the top of her shoulder, which has been worsened with reaching overhead or across her body.  Since its initial occurrence, patient states that her pain has significantly improved-and is nearly resolved today.  Despite its overall improvement, patient still wanted to be seen and evaluated today.  No numbness or tingling in the arm or hand.  She denies any swelling or bruising to the area.  She says that she has otherwise been well, though she is curious to know if it is okay for her to continue her cardiac medications.              ROS:   All other systems were reviewed and are negative.  Objective: Vital Signs: BP (!) 189/61   Ht 5\' 5"  (1.651 m)   Wt (!) 309 lb (140.2 kg)   BMI 51.42 kg/m   Physical Exam:  General:  Alert and oriented, in no acute distress. Pulm:  Breathing unlabored. Psy:  Normal mood, congruent affect. Skin: Right shoulder with no swelling, bruising, erythema.  Overlying skin intact. Right Shoulder Exam:  Inspection: Symmetric muscle mass, no atrophy or deformity, no scars. Palpation: Tenderness to palpation over the right AC joint, as well as within the subacromial area.  No significant tenderness over the biceps tendon, or superior trapezius. Range of motion: Reduced range of motion with overhead motion beyond 90 degrees and AB duction as well as flexion.  Apley scratch to approximately T7 with  right shoulder, which is much better than asymptomatic left shoulder.  Rotator cuff testing:  Full strength and mild pain with empty can (supraspinatus).  External rotation with full strength and no pain. Full strength and no pain with Napoleon.  AC joint testing: Endorses moderate AC tenderness to palpation, though has negative scarf test. Endorses some discomfort with active compression test.  Biceps testing: Negative speeds.  Labral testing: O'Brien's/speeds with full strength and no pain. Impingement testing: Endorses some discomfort with Hawkins  Strength testing:  5 out of 5 strength with shoulder abduction (C5), wrist extension (C6), wrist flexion (C7), grip strength (C8), and finger abduction (T1).  Sensation: Intact to light touch throughout bilateral upper extremities.   Brisk distal capillary refill.   Imaging: No results found.  Assessment & Plan: 70 year old female with past medical history of bilateral shoulder osteoarthritis presented to clinic with right superior shoulder pain since lifting from a seated position approximately 2 weeks ago.  Examination above, which is consistent with AC joint arthritis, though complicated by a history of known subacromial bursitis and rotator cuff impingement. -Given the significant improvement in her symptoms thus far with time alone, doubt need for injection therapy at this time.  She is encouraged to try Voltaren gel over her [facility] joint to see if this will further improve her symptoms. -If no benefit with Voltaren treatment, patient is  encouraged to return to clinic for consideration of injection therapy if needed. -Continue medications as previously prescribed by specialty providers regarding her cardiac health. -Patient expresses understanding, has no further questions or concerns today.   Patient seen and evaluated with the sports medicine fellow.  I agree with the above plan of care.  Patient symptoms are consistent with  right acromioclavicular osteoarthritis.  Symptoms are already improving.  I have encouraged her to continue with Voltaren gel.  If symptoms persist, return to the office for consideration of a cortisone injection.  Follow-up as needed.

## 2020-03-17 ENCOUNTER — Telehealth: Payer: Self-pay | Admitting: Sports Medicine

## 2020-03-17 NOTE — Telephone Encounter (Signed)
Patient called states she usually gets (COVID mask) fromher medical supply Co w/no problem but when she called for them the other days, was told a Rx from her doctor is now required.  --Patient is requesting Provider call Rx  into her medical supply store  :   [facility] in [place], [place] Delivery Address: [address] [place], [place] [address]  Closes 5PM Phone: [phone]  --forwarding message to med asst for review.  --glh

## 2020-03-18 NOTE — Telephone Encounter (Signed)
Pt. Was called and informed the practice that she is in between PCP's

## 2020-03-19 ENCOUNTER — Ambulatory Visit: Payer: 59 | Admitting: Family Medicine

## 2020-05-07 ENCOUNTER — Ambulatory Visit: Payer: 59 | Admitting: Sports Medicine

## 2020-05-20 ENCOUNTER — Other Ambulatory Visit: Payer: Self-pay

## 2020-05-20 ENCOUNTER — Ambulatory Visit (INDEPENDENT_AMBULATORY_CARE_PROVIDER_SITE_OTHER): Payer: 59 | Admitting: Sports Medicine

## 2020-05-20 ENCOUNTER — Encounter: Payer: Self-pay | Admitting: Sports Medicine

## 2020-05-20 VITALS — BP 163/55 | Wt 309.0 lb

## 2020-05-20 DIAGNOSIS — M25512 Pain in left shoulder: Secondary | ICD-10-CM | POA: Diagnosis not present

## 2020-05-20 DIAGNOSIS — M1611 Unilateral primary osteoarthritis, right hip: Secondary | ICD-10-CM

## 2020-05-20 DIAGNOSIS — M25511 Pain in right shoulder: Secondary | ICD-10-CM

## 2020-05-20 DIAGNOSIS — G8929 Other chronic pain: Secondary | ICD-10-CM | POA: Diagnosis not present

## 2020-05-20 MED ORDER — DICLOFENAC SODIUM 75 MG PO TBEC: DELAYED_RELEASE_TABLET | ORAL | 1 refills | Status: DC

## 2020-05-20 MED ORDER — PREDNISONE 10 MG PO TABS: ORAL_TABLET | ORAL | 0 refills | Status: DC

## 2020-05-20 NOTE — Patient Instructions (Signed)
It was nice seeing you today [name] [name]  I have given you a prescription for prednisone and a prescription for diclofenac.  Take the prednisone for the next 6 days as directed.  Then you can take the diclofenac after that twice a day as needed.  It is a good arthritis medicine.

## 2020-05-20 NOTE — Progress Notes (Signed)
   Subjective:    Patient ID: [name] [name], female    DOB: 1950/03/22, 70 y.o.   MRN: [id number]  HPI chief complaint: Bilateral shoulder and right hip pain  [name] [name] comes in today complaining of returning bilateral shoulder pain.  She has a well-documented history of glenohumeral osteoarthritis, left greater than right.  No recent trauma.  Pain is diffuse throughout the shoulder.  It is present with any sort of overhead activity or reaching behind her back.  She is also complaining of right hip pain that is primarily along the lateral hip but does radiate at times into the groin.  She injured it stepping down from a [employer] truck late last week.  She was seen at a [facility] facility and x-rays of her hip showed no obvious fracture.  The same x-rays did show osteoarthritis.    Review of Systems    As above Objective:   Physical Exam  Well-developed, well-nourished.  No acute distress  Examination of both shoulders shows limited active and passive range of motion.  No obvious deformity.  Diffuse tenderness to palpation.  Good rotator cuff strength.  Good pulses distally.  Examination of the right hip shows smooth painless hip range of motion with a negative logroll.  Diffuse tenderness to palpation along the lateral hip.  No palpable defect.  Neurovascularly intact distally.  Walking with a slight limp.      Assessment & Plan:   Bilateral shoulder pain secondary to glenohumeral DJD Right hip pain likely secondary to to osteoarthritis  Review of the patient's chart shows her to be fairly healthy with no known drug allergies.  I see no reason why we can't try a 6-day Sterapred Dosepak for her diffuse arthritic pain.  I will also provide her with a prescription for Voltaren 75 mg to take twice daily thereafter if needed.  She understands that this is not a medicine to take every day but only as needed.

## 2020-06-19 ENCOUNTER — Other Ambulatory Visit: Payer: Self-pay

## 2020-06-19 MED ORDER — PREDNISONE 10 MG PO TABS: ORAL_TABLET | ORAL | 0 refills | Status: DC

## 2020-06-19 NOTE — Progress Notes (Signed)
Pt is asking for a prednisone refill for her arm pain.

## 2020-06-26 ENCOUNTER — Ambulatory Visit (INDEPENDENT_AMBULATORY_CARE_PROVIDER_SITE_OTHER): Payer: 59 | Admitting: Sports Medicine

## 2020-06-26 ENCOUNTER — Other Ambulatory Visit: Payer: Self-pay

## 2020-06-26 ENCOUNTER — Encounter: Payer: Self-pay | Admitting: Sports Medicine

## 2020-06-26 VITALS — BP 144/68 | Ht 65.0 in | Wt 309.0 lb

## 2020-06-26 DIAGNOSIS — M25572 Pain in left ankle and joints of left foot: Secondary | ICD-10-CM | POA: Diagnosis not present

## 2020-06-26 NOTE — Progress Notes (Signed)
   Subjective:    Patient ID: [name] [name], female    DOB: 14-Apr-1950, 70 y.o.   MRN: [id number]  HPI chief complaint: Left ankle pain  [name] presents today complaining of medial sided left ankle pain which has been present for a few weeks.  She denies any trauma.  She describes a sharp stabbing pain that is most noticeable at night when trying to sleep.  She denies any significant pain with walking or standing.  She has not noticed any new swelling but admits that she has chronic swelling of both lower extremities.  She tried purchasing some compression socks online but they were uncomfortable.  She has not had any significant problems with her ankle in the past.  Interim medical history reviewed Medications reviewed Allergies reviewed    Review of Systems As above    Objective:   Physical Exam  Left ankle: Exam is limited by body habitus and significant bilateral lower extremity swelling.  She is tender to palpation along the medial ankle diffusely.  No tenderness along the lateral ankle.  Ankle is grossly stable ligamentous exam.  Pulses intact.  Ambulating with a limp.  MSK ultrasound was performed.  Quality of images was limited by body habitus.  There does not appear to be any significant medial OA of the ankle joint.  No obvious medial malleolar stress fracture and no obvious peroneal tendon tear.      Assessment & Plan:   Left ankle pain  Her symptoms may be related to the significant lower extremity edema that she has.  I provided her with a prescription for [facility] medical supply for knee-high compression stockings.  She will try this for a couple of weeks and if symptoms persist or worsen then I will need to consider further diagnostic imaging.  Follow-up for ongoing or recalcitrant issues.

## 2020-07-09 ENCOUNTER — Other Ambulatory Visit: Payer: Self-pay | Admitting: Gastroenterology

## 2020-07-31 ENCOUNTER — Other Ambulatory Visit: Payer: Self-pay

## 2020-07-31 ENCOUNTER — Encounter (HOSPITAL_COMMUNITY): Payer: Self-pay | Admitting: Gastroenterology

## 2020-08-05 ENCOUNTER — Encounter (HOSPITAL_COMMUNITY): Payer: Self-pay | Admitting: Gastroenterology

## 2020-08-05 ENCOUNTER — Ambulatory Visit (HOSPITAL_COMMUNITY)
Admission: RE | Admit: 2020-08-05 | Discharge: 2020-08-05 | Disposition: A | Discharge status: Home / Self Care | Payer: 59 | Attending: Gastroenterology | Admitting: Gastroenterology

## 2020-08-05 ENCOUNTER — Ambulatory Visit (HOSPITAL_COMMUNITY): Payer: 59 | Admitting: Certified Registered"

## 2020-08-05 ENCOUNTER — Other Ambulatory Visit: Payer: Self-pay

## 2020-08-05 ENCOUNTER — Encounter (HOSPITAL_COMMUNITY)
Admission: RE | Disposition: A | Discharge status: Home / Self Care | Payer: Self-pay | Source: Home / Self Care | Attending: Gastroenterology

## 2020-08-05 DIAGNOSIS — Z87891 Personal history of nicotine dependence: Secondary | ICD-10-CM | POA: Diagnosis not present

## 2020-08-05 DIAGNOSIS — K6389 Other specified diseases of intestine: Secondary | ICD-10-CM | POA: Diagnosis not present

## 2020-08-05 DIAGNOSIS — Z7902 Long term (current) use of antithrombotics/antiplatelets: Secondary | ICD-10-CM | POA: Diagnosis not present

## 2020-08-05 DIAGNOSIS — Z79899 Other long term (current) drug therapy: Secondary | ICD-10-CM | POA: Diagnosis not present

## 2020-08-05 DIAGNOSIS — K529 Noninfective gastroenteritis and colitis, unspecified: Secondary | ICD-10-CM | POA: Insufficient documentation

## 2020-08-05 DIAGNOSIS — Z1211 Encounter for screening for malignant neoplasm of colon: Secondary | ICD-10-CM | POA: Insufficient documentation

## 2020-08-05 DIAGNOSIS — Z8673 Personal history of transient ischemic attack (TIA), and cerebral infarction without residual deficits: Secondary | ICD-10-CM | POA: Insufficient documentation

## 2020-08-05 DIAGNOSIS — Z8249 Family history of ischemic heart disease and other diseases of the circulatory system: Secondary | ICD-10-CM | POA: Diagnosis not present

## 2020-08-05 DIAGNOSIS — Z823 Family history of stroke: Secondary | ICD-10-CM | POA: Diagnosis not present

## 2020-08-05 DIAGNOSIS — K5730 Diverticulosis of large intestine without perforation or abscess without bleeding: Secondary | ICD-10-CM | POA: Insufficient documentation

## 2020-08-05 DIAGNOSIS — Z6841 Body Mass Index (BMI) 40.0 and over, adult: Secondary | ICD-10-CM | POA: Diagnosis not present

## 2020-08-05 DIAGNOSIS — K648 Other hemorrhoids: Secondary | ICD-10-CM | POA: Diagnosis not present

## 2020-08-05 DIAGNOSIS — Z96653 Presence of artificial knee joint, bilateral: Secondary | ICD-10-CM | POA: Insufficient documentation

## 2020-08-05 HISTORY — PX: COLONOSCOPY WITH PROPOFOL: SHX5780

## 2020-08-05 HISTORY — PX: BIOPSY: SHX5522

## 2020-08-05 SURGERY — COLONOSCOPY WITH PROPOFOL
Anesthesia: Monitor Anesthesia Care

## 2020-08-05 MED ORDER — ACETAMINOPHEN 325 MG PO TABS
650.0000 mg | ORAL_TABLET | Freq: Once | ORAL | Status: AC
  Administered 2020-08-05: 650 mg via ORAL
  Filled 2020-08-05: qty 2

## 2020-08-05 MED ORDER — LIDOCAINE 2% (20 MG/ML) 5 ML SYRINGE
INTRAMUSCULAR | Status: DC | PRN
  Administered 2020-08-05: 40 mg via INTRAVENOUS

## 2020-08-05 MED ORDER — PROPOFOL 10 MG/ML IV BOLUS
INTRAVENOUS | Status: DC | PRN
  Administered 2020-08-05 (×7): 50 mg via INTRAVENOUS

## 2020-08-05 MED ORDER — LACTATED RINGERS IV SOLN: Freq: Once | INTRAVENOUS | Status: AC

## 2020-08-05 MED ORDER — HYDRALAZINE HCL 20 MG/ML IJ SOLN: 10.0000 mg | Freq: Once | INTRAMUSCULAR | Status: DC

## 2020-08-05 MED ORDER — LACTATED RINGERS IV SOLN: INTRAVENOUS | Status: DC | PRN

## 2020-08-05 MED ORDER — SODIUM CHLORIDE 0.9 % IV SOLN: INTRAVENOUS | Status: DC

## 2020-08-05 MED ORDER — HYDRALAZINE HCL 20 MG/ML IJ SOLN
INTRAMUSCULAR | Status: AC
  Filled 2020-08-05: qty 1

## 2020-08-05 SURGICAL SUPPLY — 21 items

## 2020-08-05 NOTE — Progress Notes (Signed)
Pt c/o left shoulder pain. Sts she does see ortho for it. Dr. Levora Angel aware. Pt repositioned for comfort. Heat applied. Pt did not care for heat. Ice applied as requested. Pt medicated a/o for pain. Pt CSM. Cont to monitor.

## 2020-08-05 NOTE — Transfer of Care (Addendum)
Immediate Anesthesia Transfer of Care Note  Patient: [name] [name]  Procedure(s) Performed: COLONOSCOPY WITH PROPOFOL BIOPSY  Patient Location: PACU and Endoscopy Unit  Anesthesia Type:MAC  Level of Consciousness: awake and alert   Airway & Oxygen Therapy: Patient Spontanous Breathing and Patient connected to face mask oxygen  Post-op Assessment: Report given to RN and Post -op Vital signs reviewed and stable  Post vital signs: Reviewed and stable  Last Vitals:  Vitals Value Taken Time  BP 77/56 08/05/20 0912  Temp 36.7 C 08/05/20 0902  Pulse 90 08/05/20 0913  Resp 14 08/05/20 0912  SpO2 92 % 08/05/20 0913  Vitals shown include unvalidated device data.  Last Pain:  Vitals:   08/05/20 0902  TempSrc: Oral  PainSc: 3       Patients Stated Pain Goal: 0 (21/94/71 2527)  Complications: No notable events documented.

## 2020-08-05 NOTE — Progress Notes (Signed)
Pt sts she feels better. No further complaints. Pt cleared to be discharged to home. Awaiting family for ride.

## 2020-08-05 NOTE — H&P (Signed)
Primary Care Physician:  Laurann Montana, MD Primary Gastroenterologist:  Dr. Levora Angel  Reason for Visit : Colon cancer screening  HPI: [name] [name] is a 70 y.o. female with past medical history of morbid obesity with BMI of more than 50, history of CVA currently on Plavix, history of COPD, history of diabetes and mild coronary artery disease here to discuss repeat colonoscopy.        Last colonoscopy in October 2011 by Dr. Evette Cristal showed mild inflammation in the ascending colon otherwise normal colon. Biopsy showed focal active colitis which could be from NSAID use. Repeat colonoscopy was recommended in 10 years.        Patient denies any acute GI symptoms.  Occasional loose stools.  Occasional reflux depending on certain foods.  No family history of colon cancer.  Past Medical History:  Diagnosis Date   Arthritis    "right knee" (07/02/2015)   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   Diabetes (HCC)    type II   Hyperlipidemia    Hypertension    Obesity    OSA (obstructive sleep apnea)    Overactive bladder    S/P right knee arthroscopy    with septic joint growing out MSSA   Septic arthritis of knee, right (HCC)    Wears glasses     Past Surgical History:  Procedure Laterality Date   COLONOSCOPY     DIAGNOSTIC LAPAROSCOPY     JOINT REPLACEMENT     KNEE ARTHROSCOPY Right 12/09/2012   Procedure: ARTHROSCOPIC IRRIGATION AND DEBRIDEMENT RIGHT KNEE;  Surgeon: Budd Palmer, MD;  Location: [facility] OR;  Service: Orthopedics;  Laterality: Right;   LEFT HEART CATH AND CORONARY ANGIOGRAPHY N/A 09/02/2016   Procedure: Left Heart Cath and Coronary Angiography;  Surgeon: Rinaldo Cloud, MD;  Location: [facility] INVASIVE CV LAB;  Service: Cardiovascular;  Laterality: N/A;   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/06/2014   Procedure: PLACEMENT OF CEMENT SPACER;  Surgeon: Loreta Ave, MD;  Location: [facility] OR;  Service: Orthopedics;  Laterality: Right;   TOTAL KNEE  ARTHROPLASTY Bilateral 2011-2012   left-right   TOTAL KNEE REVISION Right 11/06/2014   Procedure: REMOVAL OF TOTAL COMPONENTS, EXTENSIVE  IRRIGATION AND DEBRIDEMENT  RIGHT KNEE;  Surgeon: Loreta Ave, MD;  Location: [facility] OR;  Service: Orthopedics;  Laterality: Right;   TOTAL KNEE REVISION Right 07/02/2015   TOTAL KNEE REVISION Right 07/02/2015   Procedure: TOTAL KNEE REVISION;  Surgeon: Loreta Ave, MD;  Location: [facility] OR;  Service: Orthopedics;  Laterality: Right;   TUBAL LIGATION  ~ 1980   tummy tuck  1980s    Prior to Admission medications   Medication Sig Start Date End Date Taking? Authorizing Provider  albuterol (VENTOLIN HFA) 108 (90 Base) MCG/ACT inhaler Inhale 2 puffs into the lungs every 4 (four) hours as needed for wheezing or shortness of breath. 07/25/20  Yes [provider]  carvedilol (COREG) 25 MG tablet Take 50 mg by mouth 2 (two) times daily. 06/16/20  Yes [provider]  clopidogrel (PLAVIX) 75 MG tablet Take 1 tablet (75 mg total) by mouth daily. 10/11/17  Yes Albertine Grates, MD  cyclobenzaprine (FLEXERIL) 10 MG tablet Take 10 mg by mouth 2 (two) times daily as needed for muscle spasms. 07/16/20  Yes [provider]  diclofenac (VOLTAREN) 75 MG EC tablet Take 75 mg by mouth daily as needed for mild pain or moderate pain.   Yes [provider]  estradiol (ESTRACE) 0.1 MG/GM vaginal cream Place 1 Applicatorful vaginally daily as needed (vaginal burning). 06/30/20  Yes [provider]  minoxidil (LONITEN) 2.5 MG tablet Take 5 mg by mouth daily. 06/23/20  Yes [provider]  mupirocin cream (BACTROBAN) 2 % Apply 1 application topically daily as needed (rash under breast).   Yes [provider]  nystatin (MYCOSTATIN/NYSTOP) powder Apply 1 application topically 3 (three) times daily as needed for rash. 06/11/20  Yes [provider]  pantoprazole (PROTONIX) 40 MG tablet Take 40 mg by mouth daily as needed (heartburn).   Yes  [provider]  solifenacin (VESICARE) 10 MG tablet Take 10 mg by mouth at bedtime. 07/01/20  Yes [provider]  spironolactone (ALDACTONE) 25 MG tablet Take 25 mg by mouth daily. 06/06/20  Yes [provider]  triamcinolone lotion (KENALOG) 0.1 % Apply 1 application topically daily as needed (Reash under breast).   Yes [provider]  valsartan (DIOVAN) 320 MG tablet Take 320 mg by mouth at bedtime. 06/24/20  Yes [provider]    Scheduled Meds: Continuous Infusions:  sodium chloride     PRN Meds:.  Allergies as of 07/09/2020   (No Known Allergies)    Family History  Problem Relation Age of Onset   Breast cancer Mother    Stroke Father    Hypertension Other     Social History   Socioeconomic History   Marital status: Divorced    Spouse name: Not on file   Number of children: Not on file   Years of education: Not on file   Highest education level: Not on file  Occupational History   Not on file  Tobacco Use   Smoking status: Former    Packs/day: 0.50    Years: 12.00    Pack years: 6.00    Types: Cigarettes    Quit date: 08/01/1992    Years since quitting: 28.0   Smokeless tobacco: Never  Vaping Use   Vaping Use: Never used  Substance and Sexual Activity   Alcohol use: No   Drug use: No   Sexual activity: Never  Other Topics Concern   Not on file  Social History Narrative   Not on file   Social Determinants of Health   Financial Resource Strain: Not on file  Food Insecurity: Not on file  Transportation Needs: Not on file  Physical Activity: Not on file  Stress: Not on file  Social Connections: Not on file  Intimate Partner Violence: Not on file      Physical Exam: Vital signs: Vitals:   08/05/20 0731 08/05/20 0800  BP: (!) 203/52 (!) 184/47  Pulse: 94   Resp: (!) 24   Temp: 98.1 F (36.7 C)   SpO2: 95%      General:   Morbidly obese , cooperative in NAD Lungs:  Clear throughout to  auscultation.   No wheezes, crackles, or rhonchi. No acute distress. Heart:  Regular rate and rhythm; no murmurs, clicks, rubs,  or gallops. Abdomen: Soft, nontender, nondistended, bowel sounds present.  No peritoneal signs Rectal:  Deferred  GI:  Lab Results: No results for input(s): WBC, HGB, HCT, PLT in the last 72 hours. BMET No results for input(s): NA, K, CL, CO2, GLUCOSE, BUN, CREATININE, CALCIUM in the last 72 hours. LFT No results for input(s): PROT, ALBUMIN, AST, ALT, ALKPHOS, BILITOT, BILIDIR, IBILI in the last 72 hours. PT/INR No results for input(s): LABPROT, INR in the last 72 hours.  Studies/Results: No results found.  Impression/Plan: Colon cancer screening History of CVA.  Plavix currently on hold Morbid obesity  Recommendations ------------------------ -Proceed with colonoscopy today.  Risks (bleeding, infection, bowel perforation that could require surgery, sedation-related changes in cardiopulmonary systems), benefits (identification and possible treatment of source of symptoms, exclusion of certain causes of symptoms), and alternatives (watchful waiting, radiographic imaging studies, empiric medical treatment)  were explained to patient/family in detail and patient wishes to proceed.     LOS: 0 days   Kathi Der  MD, FACP 08/05/2020, 8:13 AM  Contact #  [phone]

## 2020-08-05 NOTE — Op Note (Signed)
[facility] Patient Name: [name] [name] Procedure Date: 08/05/2020 MRN: [id number] Attending MD: Kathi Der , MD Date of Birth: 21-Nov-1950 CSN: [id number] Age: 70 Admit Type: Outpatient Procedure:                Colonoscopy Indications:              Screening for colorectal malignant neoplasm, This                            is the patient's first colonoscopy Providers:                Kathi Der, MD, Shelda Jakes, RN,                            Sunday Corn Mbumina, Technician Referring MD:              Medicines:                Sedation Administered by an Anesthesia Professional Complications:            No immediate complications. Estimated Blood Loss:     Estimated blood loss was minimal. Procedure:                Pre-Anesthesia Assessment:                           - Prior to the procedure, a History and Physical                            was performed, and patient medications and                            allergies were reviewed. The patient's tolerance of                            previous anesthesia was also reviewed. The risks                            and benefits of the procedure and the sedation                            options and risks were discussed with the patient.                            All questions were answered, and informed consent                            was obtained. Prior Anticoagulants: The patient has                            taken Plavix (clopidogrel), last dose was 5 days                            prior to procedure. ASA Grade Assessment: III - A  patient with severe systemic disease. After                            reviewing the risks and benefits, the patient was                            deemed in satisfactory condition to undergo the                            procedure.                           After obtaining informed consent, the colonoscope                            was  passed under direct vision. Throughout the                            procedure, the patient's blood pressure, pulse, and                            oxygen saturations were monitored continuously. The                            PCF-H190DL ([id number]) Olympus pediatric colonscope                            was introduced through the anus and advanced to the                            the terminal ileum, with identification of the                            appendiceal orifice and IC valve. The colonoscopy                            was performed without difficulty. The patient                            tolerated the procedure well. The quality of the                            bowel preparation was good. Scope In: 8:38:18 AM Scope Out: 8:53:34 AM Scope Withdrawal Time: 0 hours 8 minutes 47 seconds  Total Procedure Duration: 0 hours 15 minutes 16 seconds  Findings:      The perianal and digital rectal examinations were normal.      The terminal ileum appeared normal.      Scattered mild inflammation characterized by congestion (edema),       erosions and erythema was found in the ascending colon and in the cecum.       Biopsies were taken with a cold forceps for histology.      Normal mucosa was found in the transverse colon. Biopsies were taken       with a cold forceps for histology.  Scattered pseudopolyps were found in the descending colon. Biopsies were       taken with a cold forceps for histology.      Scattered mild inflammation characterized by erythema was found in the       sigmoid colon. Biopsies were taken with a cold forceps for histology.      A few small and large-mouthed diverticula were found in the sigmoid       colon.      Internal hemorrhoids were found during retroflexion. The hemorrhoids       were medium-sized. Impression:               - The examined portion of the ileum was normal.                           - Scattered mild inflammation was found in the                             ascending colon and in the cecum secondary to                            colitis. Biopsied.                           - Normal mucosa in the transverse colon. Biopsied.                           - Pseudopolyps in the descending colon. Biopsied.                           - Scattered mild inflammation was found in the                            sigmoid colon. Biopsied.                           - Diverticulosis in the sigmoid colon.                           - Internal hemorrhoids. Moderate Sedation:      Moderate (conscious) sedation was personally administered by an       anesthesia professional. The following parameters were monitored: oxygen       saturation, heart rate, blood pressure, and response to care. Recommendation:           - Patient has a contact number available for                            emergencies. The signs and symptoms of potential                            delayed complications were discussed with the                            patient. Return to normal activities tomorrow.  Written discharge instructions were provided to the                            patient.                           - Resume previous diet.                           - Continue present medications. Procedure Code(s):        --- Professional ---                           [phone], Colonoscopy, flexible; with biopsy, single                            or multiple Diagnosis Code(s):        --- Professional ---                           Z12.11, Encounter for screening for malignant                            neoplasm of colon                           K64.8, Other hemorrhoids                           K52.9, Noninfective gastroenteritis and colitis,                            unspecified                           K51.40, Inflammatory polyps of colon without                            complications                           K57.30, Diverticulosis of  large intestine without                            perforation or abscess without bleeding CPT copyright 2019 American Medical Association. All rights reserved. The codes documented in this report are preliminary and upon coder review may  be revised to meet current compliance requirements. Kathi DerParag Jylian Pappalardo, MD Kathi DerParag Romanita Fager, MD 08/05/2020 9:02:14 AM Number of Addenda: 0

## 2020-08-05 NOTE — Anesthesia Postprocedure Evaluation (Signed)
Anesthesia Post Note  Patient: [name] [name]  Procedure(s) Performed: COLONOSCOPY WITH PROPOFOL BIOPSY     Patient location during evaluation: Endoscopy Anesthesia Type: MAC Level of consciousness: awake and alert Pain management: pain level controlled Vital Signs Assessment: post-procedure vital signs reviewed and stable Respiratory status: spontaneous breathing, nonlabored ventilation, respiratory function stable and patient connected to nasal cannula oxygen Cardiovascular status: stable and blood pressure returned to baseline Postop Assessment: no apparent nausea or vomiting Anesthetic complications: no   No notable events documented.  Last Vitals:  Vitals:   08/05/20 0930 08/05/20 0940  BP: 109/85 (!) 158/56  Pulse: 88 88  Resp: (!) 22 17  Temp:    SpO2: 92% 95%    Last Pain:  Vitals:   08/05/20 0930  TempSrc:   PainSc: 10-Worst pain ever                 Belenda Cruise P Erum Cercone

## 2020-08-05 NOTE — Anesthesia Preprocedure Evaluation (Signed)
Anesthesia Evaluation  Patient identified by MRN, date of birth, ID band Patient awake    Reviewed: Allergy & Precautions, NPO status , Patient's Chart, lab work & pertinent test results  Airway Mallampati: III  TM Distance: >3 FB Neck ROM: Full    Dental  (+) Teeth Intact   Pulmonary sleep apnea , former smoker,    Pulmonary exam normal        Cardiovascular hypertension, Pt. on medications and Pt. on home beta blockers  Rhythm:Regular Rate:Normal     Neuro/Psych CVA, No Residual Symptoms negative psych ROS   GI/Hepatic Neg liver ROS, GERD  Medicated,Screening    Endo/Other  diabetes, Type 2  Renal/GU negative Renal ROS  negative genitourinary   Musculoskeletal  (+) Arthritis ,   Abdominal (+)  Abdomen: soft. Bowel sounds: normal.  Peds  Hematology negative hematology ROS (+)   Anesthesia Other Findings   Reproductive/Obstetrics                             Anesthesia Physical Anesthesia Plan  ASA: 3  Anesthesia Plan: MAC   Post-op Pain Management:    Induction: Intravenous  PONV Risk Score and Plan: 2 and Propofol infusion  Airway Management Planned: Simple Face Mask, Natural Airway and Nasal Cannula  Additional Equipment: None  Intra-op Plan:   Post-operative Plan:   Informed Consent: I have reviewed the patients History and Physical, chart, labs and discussed the procedure including the risks, benefits and alternatives for the proposed anesthesia with the patient or authorized representative who has indicated his/her understanding and acceptance.     Dental advisory given  Plan Discussed with:   Anesthesia Plan Comments:         Anesthesia Quick Evaluation

## 2020-08-05 NOTE — Anesthesia Procedure Notes (Signed)
Procedure Name: MAC Date/Time: 08/05/2020 8:23 AM Performed by: Cynda Familia, CRNA Pre-anesthesia Checklist: Patient identified, Emergency Drugs available, Suction available, Patient being monitored and Timeout performed Patient Re-evaluated:Patient Re-evaluated prior to induction Oxygen Delivery Method: Simple face mask Placement Confirmation: positive ETCO2 and breath sounds checked- equal and bilateral Dental Injury: Teeth and Oropharynx as per pre-operative assessment

## 2020-08-05 NOTE — Discharge Instructions (Signed)
YOU HAD AN ENDOSCOPIC PROCEDURE TODAY: Refer to the procedure report and other information in the discharge instructions given to you for any specific questions about what was found during the examination. If this information does not answer your questions, please call [facility] office at [phone] to clarify.   YOU SHOULD EXPECT: Some feelings of bloating in the abdomen. Passage of more gas than usual. Walking can help get rid of the air that was put into your GI tract during the procedure and reduce the bloating. If you had a lower endoscopy (such as a colonoscopy or flexible sigmoidoscopy) you may notice spotting of blood in your stool or on the toilet paper. Some abdominal soreness may be present for a day or two, also.  DIET: Your first meal following the procedure should be a light meal and then it is ok to progress to your normal diet. A half-sandwich or bowl of soup is an example of a good first meal. Heavy or fried foods are harder to digest and may make you feel nauseous or bloated. Drink plenty of fluids but you should avoid alcoholic beverages for 24 hours. If you had a esophageal dilation, please see attached instructions for diet.    ACTIVITY: Your care partner should take you home directly after the procedure. You should plan to take it easy, moving slowly for the rest of the day. You can resume normal activity the day after the procedure however YOU SHOULD NOT DRIVE, use power tools, machinery or perform tasks that involve climbing or major physical exertion for 24 hours (because of the sedation medicines used during the test).   SYMPTOMS TO REPORT IMMEDIATELY: A gastroenterologist can be reached at any hour. Please call [phone]  for any of the following symptoms:  Following lower endoscopy (colonoscopy, flexible sigmoidoscopy) Excessive amounts of blood in the stool  Significant tenderness, worsening of abdominal pains  Swelling of the abdomen that is new, acute  Fever of 100  or higher  Following upper endoscopy (EGD, EUS, ERCP, esophageal dilation) Vomiting of blood or coffee ground material  New, significant abdominal pain  New, significant chest pain or pain under the shoulder blades  Painful or persistently difficult swallowing  New shortness of breath  Black, tarry-looking or red, bloody stools  FOLLOW UP:  If any biopsies were taken you will be contacted by phone or by letter within the next 1-3 weeks. Call [phone]  if you have not heard about the biopsies in 3 weeks.  Please also call with any specific questions about appointments or follow up tests. YOU HAD AN ENDOSCOPIC PROCEDURE TODAY: Refer to the procedure report and other information in the discharge instructions given to you for any specific questions about what was found during the examination. If this information does not answer your questions, please call [facility] office at [phone] to clarify.   YOU SHOULD EXPECT: Some feelings of bloating in the abdomen. Passage of more gas than usual. Walking can help get rid of the air that was put into your GI tract during the procedure and reduce the bloating. If you had a lower endoscopy (such as a colonoscopy or flexible sigmoidoscopy) you may notice spotting of blood in your stool or on the toilet paper. Some abdominal soreness may be present for a day or two, also.  DIET: Your first meal following the procedure should be a light meal and then it is ok to progress to your normal diet. A half-sandwich or bowl of soup is an   example of a good first meal. Heavy or fried foods are harder to digest and may make you feel nauseous or bloated. Drink plenty of fluids but you should avoid alcoholic beverages for 24 hours. If you had a esophageal dilation, please see attached instructions for diet.    ACTIVITY: Your care partner should take you home directly after the procedure. You should plan to take it easy, moving slowly for the rest of the day. You can resume  normal activity the day after the procedure however YOU SHOULD NOT DRIVE, use power tools, machinery or perform tasks that involve climbing or major physical exertion for 24 hours (because of the sedation medicines used during the test).   SYMPTOMS TO REPORT IMMEDIATELY: A gastroenterologist can be reached at any hour. Please call [phone]  for any of the following symptoms:  Following lower endoscopy (colonoscopy, flexible sigmoidoscopy) Excessive amounts of blood in the stool  Significant tenderness, worsening of abdominal pains  Swelling of the abdomen that is new, acute  Fever of 100 or higher    FOLLOW UP:  If any biopsies were taken you will be contacted by phone or by letter within the next 1-3 weeks. Call [phone]  if you have not heard about the biopsies in 3 weeks.  Please also call with any specific questions about appointments or follow up tests. 

## 2020-08-06 LAB — SURGICAL PATHOLOGY

## 2020-08-07 ENCOUNTER — Encounter (HOSPITAL_COMMUNITY): Payer: Self-pay | Admitting: Gastroenterology

## 2020-09-02 ENCOUNTER — Ambulatory Visit: Payer: 59 | Admitting: Sports Medicine

## 2020-09-04 ENCOUNTER — Encounter: Payer: Self-pay | Admitting: Sports Medicine

## 2020-09-04 ENCOUNTER — Other Ambulatory Visit: Payer: Self-pay

## 2020-09-04 ENCOUNTER — Ambulatory Visit (INDEPENDENT_AMBULATORY_CARE_PROVIDER_SITE_OTHER): Payer: 59 | Admitting: Sports Medicine

## 2020-09-04 VITALS — Ht 65.0 in | Wt 330.0 lb

## 2020-09-04 DIAGNOSIS — M25512 Pain in left shoulder: Secondary | ICD-10-CM

## 2020-09-04 DIAGNOSIS — M79641 Pain in right hand: Secondary | ICD-10-CM

## 2020-09-04 DIAGNOSIS — M25511 Pain in right shoulder: Secondary | ICD-10-CM | POA: Diagnosis not present

## 2020-09-04 MED ORDER — METHYLPREDNISOLONE ACETATE 40 MG/ML IJ SUSP
40.0000 mg | Freq: Once | INTRAMUSCULAR | Status: AC
  Administered 2020-09-04: 40 mg via INTRA_ARTICULAR

## 2020-09-04 NOTE — Progress Notes (Addendum)
   Subjective:    Patient ID: [name] [name], female    DOB: [birth year], 70 y.o.   MRN: [id number]  HPI chief complaint: Right shoulder pain  [name] [name] comes in today complaining of several weeks of diffuse right shoulder pain.  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  Current symptoms began without any known trauma.  Pain is present pretty much with any sort of shoulder motion.  She is requesting a repeat cortisone injection.  These do provide her with pain relief.  Interim medical history reviewed Medications reviewed Allergies reviewed    Review of Systems As above    Objective:   Physical Exam  Well-developed, well-nourished.  No acute distress  Right shoulder: Limited active and passive range of motion in all planes primarily secondary to pain.  Diffuse tenderness to palpation throughout the shoulder but no erythema and no soft tissue swelling.  Special tests were deferred due to her level of pain.  Good pulses distally.      Assessment & Plan:   Returning right shoulder pain secondary to rotator cuff tendinopathy versus glenohumeral DJD  Patient symptoms are identical to what she has experienced in the past.  She is requesting a repeat subacromial cortisone injection and I think that is reasonable.  Right subacromial space was injected with cortisone today without complication.  She tolerates this without difficulty.  I would like to get updated x-rays of both shoulders and if her right shoulder pain persists despite today's injection then we may need to consider an MRI as well.  Phone follow-up with x-ray results when available.  Otherwise, follow-up as needed.  Consent obtained and verified. Time-out conducted. Noted no overlying erythema, induration, or other signs of local infection. Skin prepped in a sterile fashion. Topical analgesic spray: Ethyl chloride. Joint: Right subacromial space Needle: 25-gauge 1.5 inch Completed  without difficulty. Meds: 3 cc 1% Xylocaine, 1 cc (40 mg) Depo-Medrol  Advised to call if fevers/chills, erythema, induration, drainage, or persistent bleeding.   Of note, patient was also complaining of her right hand clawing at night when she is sleeping.  I recommended that we try simple cock up wrist brace to see if we can prevent that.

## 2020-10-03 ENCOUNTER — Other Ambulatory Visit: Payer: Self-pay

## 2020-10-03 ENCOUNTER — Ambulatory Visit
Admission: RE | Admit: 2020-10-03 | Discharge: 2020-10-03 | Disposition: A | Discharge status: Home / Self Care | Payer: 59 | Source: Ambulatory Visit | Attending: Sports Medicine | Admitting: Sports Medicine

## 2020-10-03 DIAGNOSIS — M25511 Pain in right shoulder: Secondary | ICD-10-CM

## 2020-10-08 ENCOUNTER — Telehealth: Payer: Self-pay | Admitting: Sports Medicine

## 2020-10-08 NOTE — Telephone Encounter (Signed)
Patient to be notified via telephone of x-ray results of both shoulders.  There is advanced glenohumeral and AC DJD in both shoulders.  If pain persists despite recent injections, I recommend she follow-up with either Dr. Everardo Pacific or Dr. Dion Saucier to discuss neck steps in treatment.

## 2020-10-09 ENCOUNTER — Other Ambulatory Visit: Payer: Self-pay | Admitting: Internal Medicine

## 2020-10-09 DIAGNOSIS — Z1231 Encounter for screening mammogram for malignant neoplasm of breast: Secondary | ICD-10-CM

## 2020-10-18 ENCOUNTER — Inpatient Hospital Stay: Admission: RE | Admit: 2020-10-18 | Payer: 59 | Source: Ambulatory Visit

## 2020-10-23 ENCOUNTER — Telehealth: Payer: Self-pay | Admitting: Adult Health

## 2020-10-23 NOTE — Telephone Encounter (Signed)
Pt called, having headaches; taking BC, Tylenol PM at night to sleep, having shortness of breath. Planning to go the emergency room today. Would like a call from the nurse.

## 2020-10-23 NOTE — Telephone Encounter (Signed)
Attempted to reach patient, no answer, no voice mail. Would advise she go to ED if her headaches are severe and not relieved with medications, and shortness of breath is worsening or accompanied by other symptoms.

## 2020-10-25 ENCOUNTER — Other Ambulatory Visit: Payer: Self-pay

## 2020-10-25 ENCOUNTER — Emergency Department (HOSPITAL_COMMUNITY)
Admission: EM | Admit: 2020-10-25 | Discharge: 2020-10-25 | Disposition: A | Discharge status: Home / Self Care | Payer: 59 | Attending: Emergency Medicine | Admitting: Emergency Medicine

## 2020-10-25 ENCOUNTER — Emergency Department (HOSPITAL_COMMUNITY): Payer: 59

## 2020-10-25 DIAGNOSIS — E785 Hyperlipidemia, unspecified: Secondary | ICD-10-CM | POA: Diagnosis not present

## 2020-10-25 DIAGNOSIS — E1169 Type 2 diabetes mellitus with other specified complication: Secondary | ICD-10-CM | POA: Diagnosis not present

## 2020-10-25 DIAGNOSIS — Z87891 Personal history of nicotine dependence: Secondary | ICD-10-CM | POA: Insufficient documentation

## 2020-10-25 DIAGNOSIS — Z96653 Presence of artificial knee joint, bilateral: Secondary | ICD-10-CM | POA: Insufficient documentation

## 2020-10-25 DIAGNOSIS — R0602 Shortness of breath: Secondary | ICD-10-CM | POA: Diagnosis present

## 2020-10-25 DIAGNOSIS — Z7902 Long term (current) use of antithrombotics/antiplatelets: Secondary | ICD-10-CM | POA: Insufficient documentation

## 2020-10-25 DIAGNOSIS — I10 Essential (primary) hypertension: Secondary | ICD-10-CM | POA: Insufficient documentation

## 2020-10-25 DIAGNOSIS — Z79899 Other long term (current) drug therapy: Secondary | ICD-10-CM | POA: Insufficient documentation

## 2020-10-25 DIAGNOSIS — M7989 Other specified soft tissue disorders: Secondary | ICD-10-CM | POA: Diagnosis not present

## 2020-10-25 DIAGNOSIS — R059 Cough, unspecified: Secondary | ICD-10-CM

## 2020-10-25 LAB — CBC WITH DIFFERENTIAL/PLATELET
Abs Immature Granulocytes: 0.03 10*3/uL (ref 0.00–0.07)
Basophils Absolute: 0 10*3/uL (ref 0.0–0.1)
Basophils Relative: 0 %
Eosinophils Absolute: 0.1 10*3/uL (ref 0.0–0.5)
Eosinophils Relative: 2 %
HCT: 37.4 % (ref 36.0–46.0)
Hemoglobin: 11 g/dL — ABNORMAL LOW (ref 12.0–15.0)
Immature Granulocytes: 1 %
Lymphocytes Relative: 26 %
Lymphs Abs: 1.3 10*3/uL (ref 0.7–4.0)
MCH: 22.8 pg — ABNORMAL LOW (ref 26.0–34.0)
MCHC: 29.4 g/dL — ABNORMAL LOW (ref 30.0–36.0)
MCV: 77.4 fL — ABNORMAL LOW (ref 80.0–100.0)
Monocytes Absolute: 0.5 10*3/uL (ref 0.1–1.0)
Monocytes Relative: 11 %
Neutro Abs: 2.9 10*3/uL (ref 1.7–7.7)
Neutrophils Relative %: 60 %
Platelets: 223 10*3/uL (ref 150–400)
RBC: 4.83 MIL/uL (ref 3.87–5.11)
RDW: 19.4 % — ABNORMAL HIGH (ref 11.5–15.5)
WBC: 4.8 10*3/uL (ref 4.0–10.5)
nRBC: 0 % (ref 0.0–0.2)

## 2020-10-25 LAB — BASIC METABOLIC PANEL
Anion gap: 8 (ref 5–15)
BUN: 11 mg/dL (ref 8–23)
CO2: 26 mmol/L (ref 22–32)
Calcium: 9.5 mg/dL (ref 8.9–10.3)
Chloride: 111 mmol/L (ref 98–111)
Creatinine, Ser: 1 mg/dL (ref 0.44–1.00)
GFR, Estimated: >60 mL/min (ref >60)
Glucose, Bld: 104 mg/dL — ABNORMAL HIGH (ref 70–99)
Potassium: 4 mmol/L (ref 3.5–5.1)
Sodium: 145 mmol/L (ref 135–145)

## 2020-10-25 LAB — BRAIN NATRIURETIC PEPTIDE: B Natriuretic Peptide: 66.2 pg/mL (ref 0.0–100.0)

## 2020-10-25 MED ORDER — FUROSEMIDE 40 MG PO TABS
20.0000 mg | ORAL_TABLET | Freq: Once | ORAL | Status: AC
  Administered 2020-10-25: 20 mg via ORAL
  Filled 2020-10-25: qty 1

## 2020-10-25 MED ORDER — FUROSEMIDE 20 MG PO TABS: 20.0000 mg | ORAL_TABLET | Freq: Every day | ORAL | 0 refills | Status: DC

## 2020-10-25 MED ORDER — BENZONATATE 100 MG PO CAPS: 100.0000 mg | ORAL_CAPSULE | Freq: Three times a day (TID) | ORAL | 0 refills | Status: DC

## 2020-10-25 NOTE — ED Provider Notes (Signed)
[facility]-EMERGENCY DEPT Provider Note   CSN: [id number] Arrival date & time: 10/25/20  [id number]     History Chief Complaint  Patient presents with   Shortness of Breath   Leg Swelling    [name] [name] is a 70 y.o. female.  70 yo  F with a cc of sob.  Going on for past week.  Has seen her PCP for this and was started on an inhaler.  Has not helped.  Developed a non productive cough.  Hx of leg swelling, feels worsening.    The history is provided by the patient.  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   Diabetes (HCC)    type II   Hyperlipidemia    Hypertension    Obesity    OSA (obstructive sleep apnea)    Overactive bladder    S/P right knee arthroscopy    with septic joint growing out MSSA   Septic arthritis of knee, right (HCC)    Wears glasses     Patient Active Problem List   Diagnosis Date Noted   Stroke (HCC) 10/06/2017   Acute CVA (cerebrovascular accident) (HCC) 10/06/2017   Trigger thumb, right thumb 05/03/2016   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   Protein-calorie malnutrition (HCC) 11/15/2014   Osteomyelitis of right knee region (HCC) 11/08/2014   Complication of device 11/04/2014   Septic arthritis (HCC) 11/04/2014   S/P right knee arthroscopy    UTI (urinary tract infection) 12/09/2012   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   Hyperlipidemia    Hypertension     Past Surgical History:  Procedure Laterality Date   BIOPSY  08/05/2020   Procedure: BIOPSY;  Surgeon:  Kathi DerBrahmbhatt, Parag, MD;  Location: [facility];  Service: Gastroenterology;;   COLONOSCOPY     COLONOSCOPY WITH PROPOFOL N/A 08/05/2020   Procedure: COLONOSCOPY WITH PROPOFOL;  Surgeon: Kathi DerBrahmbhatt, Parag, MD;  Location: [facility];  Service: Gastroenterology;  Laterality: N/A;   DIAGNOSTIC LAPAROSCOPY     JOINT REPLACEMENT     KNEE ARTHROSCOPY Right 12/09/2012   Procedure: ARTHROSCOPIC IRRIGATION AND DEBRIDEMENT RIGHT KNEE;  Surgeon: Budd PalmerMichael H Handy, MD;  Location: [facility] OR;  Service: Orthopedics;  Laterality: Right;   LEFT HEART CATH AND CORONARY ANGIOGRAPHY N/A 09/02/2016   Procedure: Left Heart Cath and Coronary Angiography;  Surgeon: Rinaldo CloudHarwani, Mohan, MD;  [facility];  Service: Cardiovascular;  Laterality: N/A;   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/06/2014   Procedure: PLACEMENT OF CEMENT SPACER;  Surgeon: Loreta Aveaniel F Murphy, MD;  [facility];  Service: Orthopedics;  Laterality: Right;   TOTAL KNEE ARTHROPLASTY Bilateral 2011-2012   left-right   TOTAL KNEE REVISION Right 11/06/2014   Procedure: REMOVAL OF TOTAL COMPONENTS, EXTENSIVE  IRRIGATION AND DEBRIDEMENT  RIGHT KNEE;  Surgeon: Loreta Aveaniel F Murphy, MD;  Location: [facility] OR;  Service: Orthopedics;  Laterality: Right;   TOTAL KNEE REVISION Right 07/02/2015   TOTAL KNEE REVISION Right 07/02/2015   Procedure: TOTAL KNEE REVISION;  Surgeon: Reuel Boomaniel  Georg Ruddle, MD;  Location: [facility] OR;  Service: Orthopedics;  Laterality: Right;   TUBAL LIGATION  ~ 1980   tummy tuck  1980s     OB History   No obstetric history on file.     Family History  Problem Relation Age of Onset   Breast cancer Mother    Stroke Father    Hypertension Other     Social History   Tobacco Use   Smoking status: Former    Packs/day: 0.50    Years: 12.00    Pack years: 6.00    Types: Cigarettes    Quit date: 08/01/1992    Years since quitting: 28.2   Smokeless tobacco: Never  Vaping Use   Vaping Use: Never used  Substance Use Topics   Alcohol  use: No   Drug use: No    Home Medications Prior to Admission medications   Medication Sig Start Date End Date Taking? Authorizing Provider  benzonatate (TESSALON) 100 MG capsule Take 1 capsule (100 mg total) by mouth every 8 (eight) hours. 10/25/20  Yes Melene Plan, DO  furosemide (LASIX) 20 MG tablet Take 1 tablet (20 mg total) by mouth daily. 10/25/20  Yes Melene Plan, DO  albuterol (VENTOLIN HFA) 108 (90 Base) MCG/ACT inhaler Inhale 2 puffs into the lungs every 4 (four) hours as needed for wheezing or shortness of breath. 07/25/20   [provider]  carvedilol (COREG) 25 MG tablet Take 50 mg by mouth 2 (two) times daily. 06/16/20   [provider]  clopidogrel (PLAVIX) 75 MG tablet Take 1 tablet (75 mg total) by mouth daily. 10/11/17   Albertine Grates, MD  cyclobenzaprine (FLEXERIL) 10 MG tablet Take 10 mg by mouth 2 (two) times daily as needed for muscle spasms. 07/16/20   [provider]  diclofenac (VOLTAREN) 75 MG EC tablet Take 75 mg by mouth daily as needed for mild pain or moderate pain.    [provider]  estradiol (ESTRACE) 0.1 MG/GM vaginal cream Place 1 Applicatorful vaginally daily as needed (vaginal burning). 06/30/20   [provider]  minoxidil (LONITEN) 2.5 MG tablet Take 5 mg by mouth daily. 06/23/20   [provider]  mupirocin cream (BACTROBAN) 2 % Apply 1 application topically daily as needed (rash under breast).    [provider]  nystatin (MYCOSTATIN/NYSTOP) powder Apply 1 application topically 3 (three) times daily as needed for rash. 06/11/20   [provider]  pantoprazole (PROTONIX) 40 MG tablet Take 40 mg by mouth daily as needed (heartburn).    [provider]  solifenacin (VESICARE) 10 MG tablet Take 10 mg by mouth at bedtime. 07/01/20   [provider]  spironolactone (ALDACTONE) 25 MG tablet Take 25 mg by mouth daily. 06/06/20   [provider]  triamcinolone lotion (KENALOG) 0.1 %  Apply 1 application topically daily as needed (Reash under breast).    [provider]  valsartan (DIOVAN) 320 MG tablet Take 320 mg by mouth at bedtime. 06/24/20   [provider]    Allergies    Metoprolol tartrate  Review of Systems   Review of Systems  Constitutional:  Negative for chills and fever.  HENT:  Negative for congestion and rhinorrhea.   Eyes:  Negative for redness and visual disturbance.  Respiratory:  Positive for cough and shortness of breath. Negative for wheezing.   Cardiovascular:  Negative for chest pain and palpitations.  Gastrointestinal:  Negative for nausea and vomiting.  Genitourinary:  Negative  for dysuria and urgency.  Musculoskeletal:  Negative for arthralgias and myalgias.  Skin:  Negative for pallor and wound.  Neurological:  Negative for dizziness and headaches.   Physical Exam Updated Vital Signs BP (!) 123/106 (BP Location: Left Arm)   Pulse 85   Temp 97.8 F (36.6 C) (Oral)   Resp 18   SpO2 91%   Physical Exam Vitals and nursing note reviewed.  Constitutional:      General: She is not in acute distress.    Appearance: She is well-developed. She is not diaphoretic.     Comments: BMI 55  HENT:     Head: Normocephalic and atraumatic.  Eyes:     Pupils: Pupils are equal, round, and reactive to light.  Cardiovascular:     Rate and Rhythm: Normal rate and regular rhythm.     Heart sounds: No murmur heard.   No friction rub. No gallop.  Pulmonary:     Effort: Pulmonary effort is normal.     Breath sounds: No wheezing or rales.  Abdominal:     General: There is no distension.     Palpations: Abdomen is soft.     Tenderness: There is no abdominal tenderness.  Musculoskeletal:        General: No tenderness.     Cervical back: Normal range of motion and neck supple.     Right lower leg: Edema present.     Left lower leg: Edema present.     Comments: BLE 2+ up to the knees  Skin:    General: Skin is warm and dry.   Neurological:     Mental Status: She is alert and oriented to person, place, and time.  Psychiatric:        Behavior: Behavior normal.    ED Results / Procedures / Treatments   Labs (all labs ordered are listed, but only abnormal results are displayed) Labs Reviewed  CBC WITH DIFFERENTIAL/PLATELET - Abnormal; Notable for the following components:      Result Value   Hemoglobin 11.0 (*)    MCV 77.4 (*)    MCH 22.8 (*)    MCHC 29.4 (*)    RDW 19.4 (*)    All other components within normal limits  BASIC METABOLIC PANEL - Abnormal; Notable for the following components:   Glucose, Bld 104 (*)    All other components within normal limits  RESP PANEL BY RT-PCR (FLU A&B, COVID) ARPGX2  BRAIN NATRIURETIC PEPTIDE    EKG EKG Interpretation  Date/Time:  Saturday October 25 2020 10:47:25 EDT Ventricular Rate:  85 PR Interval:  138 QRS Duration: 105 QT Interval:  394 QTC Calculation: 469 R Axis:   66 Text Interpretation: Sinus rhythm No significant change since last tracing Confirmed by Melene Plan [phone]) on 10/25/2020 10:57:31 AM  Radiology DG Chest Port 1 View  Result Date: 10/25/2020 CLINICAL DATA:  Cough and shortness of breath. EXAM: PORTABLE CHEST 1 VIEW COMPARISON:  10/06/2017 FINDINGS: Midline trachea. Cardiomegaly accentuated by AP portable technique. Limited evaluation for pleural fluid secondary to AP portable technique and patient body habitus. No pneumothorax. Pulmonary interstitial prominence. No gross lobar consolidation with limited evaluation of the lung bases. IMPRESSION: Cardiomegaly and low lung volumes with mild pulmonary interstitial prominence. Cannot exclude pulmonary venous congestion. Limited evaluation of the inferior chest secondary to AP portable technique and patient body habitus. Electronically Signed   By: Jeronimo Greaves M.D.   On: 10/25/2020 10:10    Procedures Procedures   Medications  Ordered in ED Medications  furosemide (LASIX) tablet 20 mg (has  no administration in time range)    ED Course  I have reviewed the triage vital signs and the nursing notes.  Pertinent labs & imaging results that were available during my care of the patient were reviewed by me and considered in my medical decision making (see chart for details).    MDM Rules/Calculators/A&P                           70 yo F with a cc of sob.  Going on for the past week.  No chest pain, cough.    Patient with no significant change in her baseline lab work.  No significant anemia no significant electrolyte abnormality renal function at baseline.  She is able to ambulate here without hypoxia.  We will try a course of Lasix.  Have her follow-up with her family doctor in the office.  Also possibly URI as etiology.  12:41 PM:  I have discussed the diagnosis/risks/treatment options with the patient and believe the pt to be eligible for discharge home to follow-up with PCP. We also discussed returning to the ED immediately if new or worsening sx occur. We discussed the sx which are most concerning (e.g., sudden worsening pain, fever, inability to tolerate by mouth) that necessitate immediate return. Medications administered to the patient during their visit and any new prescriptions provided to the patient are listed below.  Medications given during this visit Medications  furosemide (LASIX) tablet 20 mg (has no administration in time range)     The patient appears reasonably screen and/or stabilized for discharge and I doubt any other medical condition or other [facility] requiring further screening, evaluation, or treatment in the ED at this time prior to discharge.   Final Clinical Impression(s) / ED Diagnoses Final diagnoses:  Shortness of breath  Cough in adult patient    Rx / DC Orders ED Discharge Orders          Ordered    benzonatate (TESSALON) 100 MG capsule  Every 8 hours        10/25/20 1230    furosemide (LASIX) 20 MG tablet  Daily        10/25/20 1230              Melene Plan, DO 10/25/20 1241

## 2020-10-25 NOTE — ED Notes (Signed)
O2 sat while ambulating remained 93% and above

## 2020-10-25 NOTE — Discharge Instructions (Signed)
Try the fluid pills.  Follow up with your family doc in the office.  Return for worsening difficulty breathing

## 2020-10-25 NOTE — ED Triage Notes (Signed)
Per EMS, patient from home, c/o SOB on exertion with cough x1 week. Swelling to feet worsening per patient. Taking lasix as prescribed.

## 2020-10-25 NOTE — ED Notes (Signed)
Blue top tube sent to lab. 

## 2021-03-30 ENCOUNTER — Encounter (HOSPITAL_COMMUNITY): Payer: Self-pay | Admitting: Emergency Medicine

## 2021-03-30 ENCOUNTER — Other Ambulatory Visit: Payer: Self-pay

## 2021-03-30 ENCOUNTER — Ambulatory Visit (HOSPITAL_COMMUNITY)
Admission: EM | Admit: 2021-03-30 | Discharge: 2021-03-30 | Disposition: A | Discharge status: Home / Self Care | Payer: 59 | Attending: Emergency Medicine | Admitting: Emergency Medicine

## 2021-03-30 DIAGNOSIS — I159 Secondary hypertension, unspecified: Secondary | ICD-10-CM

## 2021-03-30 DIAGNOSIS — K120 Recurrent oral aphthae: Secondary | ICD-10-CM

## 2021-03-30 MED ORDER — LIDOCAINE VISCOUS HCL 2 % MT SOLN
15.0000 mL | Freq: Once | OROMUCOSAL | Status: AC
  Administered 2021-03-30: 15 mL via OROMUCOSAL

## 2021-03-30 MED ORDER — LIDOCAINE VISCOUS HCL 2 % MT SOLN: 15.0000 mL | OROMUCOSAL | 0 refills | Status: DC | PRN

## 2021-03-30 MED ORDER — AMOXICILLIN-POT CLAVULANATE 875-125 MG PO TABS: 1.0000 | ORAL_TABLET | Freq: Two times a day (BID) | ORAL | 0 refills | Status: DC

## 2021-03-30 MED ORDER — LIDOCAINE VISCOUS HCL 2 % MT SOLN
OROMUCOSAL | Status: AC
  Filled 2021-03-30: qty 15

## 2021-03-30 NOTE — ED Provider Notes (Signed)
[facility]    CSN: [id number] Arrival date & time: 03/30/21  1633    HISTORY   Chief Complaint  Patient presents with   Mouth Lesions   Sore Throat   HPI [name] [name] is a 71 y.o. female. Pt reports that since Friday had sore throat and sore on tongue causing her to have difficulty talking, eating, adds that she feels she is unable to even swallow water.  Reports called EMS last night and was told to go to UC today.    Patient has an elevated blood pressure on arrival today.  EKG performed during visit today is unchanged from previous EKG performed September 2022.  Patient states that she feels that her hypertension is secondary to her pain.  Patient states she was seen by her cardiologist a few days ago at which time her pressure was in the [id number] systolic.  EMR reviewed, I see documentation of this.  The history is provided by the patient.  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   Diabetes ([place])    type II   Hyperlipidemia    Hypertension    Obesity    OSA (obstructive sleep apnea)    Overactive bladder    S/P right knee arthroscopy    with septic joint growing out MSSA   Septic arthritis of knee, right ([place])    Wears glasses    Patient Active Problem List   Diagnosis Date Noted   Stroke ([place]) 10/06/2017   Acute CVA (cerebrovascular accident) ([place]) 10/06/2017   Trigger thumb, right thumb 05/03/2016   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   Protein-calorie malnutrition ([place]) 11/15/2014   Osteomyelitis of right knee region ([place]) 0000000   Complication of device [id number]   Septic arthritis ([place]) 11/04/2014   S/P right knee arthroscopy    UTI (urinary tract infection) 12/09/2012   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;  Service: Orthopedics;  Laterality: Right;   LEFT HEART CATH AND CORONARY ANGIOGRAPHY N/A 09/02/2016   Procedure: Left Heart Cath and Coronary Angiography;  Surgeon: Charolette Forward, MD;  Location: [place] [facility];  Service: Cardiovascular;  Laterality: N/A;   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/06/2014   Procedure: PLACEMENT OF CEMENT SPACER;  Surgeon: Ninetta Lights, MD;  Location: [place];  Service: Orthopedics;  Laterality: Right;   TOTAL KNEE ARTHROPLASTY Bilateral 2011-2012   left-right   TOTAL KNEE REVISION Right 11/06/2014   Procedure: REMOVAL OF TOTAL COMPONENTS, EXTENSIVE  IRRIGATION AND DEBRIDEMENT  RIGHT KNEE;  Surgeon: Ninetta Lights, MD;  Location: [place];  Service: Orthopedics;  Laterality: Right;   TOTAL KNEE REVISION Right 07/02/2015   TOTAL KNEE REVISION Right 07/02/2015   Procedure: TOTAL KNEE REVISION;  Surgeon: Ninetta Lights, MD;  Location: [place];  Service: Orthopedics;  Laterality: Right;   TUBAL LIGATION  ~ 1980   tummy tuck  1980s   OB History   No obstetric history on file.    Home Medications    Prior to Admission medications   Medication Sig Start Date End Date Taking? Authorizing Provider  amoxicillin-clavulanate (AUGMENTIN) 875-125 MG tablet Take 1 tablet by mouth 2 (two) times daily for 7 days. 03/30/21 04/06/21 Yes Lynden Oxford Scales, PA-C  lidocaine (XYLOCAINE) 2 % solution Use as directed 15 mLs in the mouth or throat every 3 (three) hours as  needed for mouth pain (Sore throat). 03/30/21  Yes Lynden Oxford Scales, PA-C  albuterol (VENTOLIN HFA) 108 (90 Base) MCG/ACT inhaler Inhale 2 puffs into the lungs every 4 (four) hours as needed for wheezing or shortness of breath. 07/25/20   [provider]  carvedilol (COREG) 25 MG tablet Take 50 mg by mouth 2 (two) times daily. 06/16/20   [provider]  clopidogrel (PLAVIX) 75 MG tablet Take 1 tablet (75 mg total) by mouth daily. 10/11/17   Florencia Reasons, MD  cyclobenzaprine (FLEXERIL) 10 MG tablet Take 10 mg by mouth 2 (two) times daily as needed for muscle spasms. 07/16/20   [provider]  diclofenac (VOLTAREN) 75 MG EC tablet Take 75 mg by mouth daily as needed for mild pain or moderate pain.    [provider]  estradiol (ESTRACE) 0.1 MG/GM vaginal cream Place 1 Applicatorful vaginally daily as needed (vaginal burning). 06/30/20   [provider]  furosemide (LASIX) 20 MG tablet Take 1 tablet (20 mg total) by mouth daily. 10/25/20   Deno Etienne, DO  minoxidil (LONITEN) 2.5 MG tablet Take 5 mg by mouth daily. 06/23/20   [provider]  mupirocin cream (BACTROBAN) 2 % Apply 1 application topically daily as needed (rash under breast).    [provider]  nystatin (MYCOSTATIN/NYSTOP) powder Apply 1 application topically 3 (three) times daily as needed for rash. 06/11/20   [provider]  pantoprazole (PROTONIX) 40 MG tablet Take 40 mg by mouth daily as needed (heartburn).    [provider]  solifenacin (VESICARE) 10 MG tablet Take 10 mg by mouth at bedtime. 07/01/20   [provider]  spironolactone (ALDACTONE) 25 MG tablet Take 25 mg by mouth daily. 06/06/20   [provider]  triamcinolone lotion (KENALOG) 0.1 % Apply 1 application topically daily as needed (Reash under breast).    [provider]  valsartan (DIOVAN) 320 MG tablet Take 320 mg by mouth at bedtime. 06/24/20   [provider]    Family History Family History  Problem Relation Age of Onset   Breast cancer Mother    Stroke Father    Hypertension Other    Social History Social History   Tobacco Use   Smoking status: Former    Packs/day: 0.50    Years: 12.00    Pack years: 6.00    Types: Cigarettes    Quit date: 08/01/1992    Years since quitting: 28.6   Smokeless tobacco: Never  Vaping Use   Vaping Use: Never used  Substance Use Topics   Alcohol use: No   Drug use: No   Allergies   Metoprolol tartrate  Review of Systems Review of Systems Pertinent findings noted in history of present illness.   Physical Exam Triage Vital Signs ED Triage Vitals  Enc Vitals Group  BP 12/12/20 0827 (!) 147/82     Pulse Rate 12/12/20 0827 72     Resp 12/12/20 0827 18     Temp 12/12/20 0827 98.3 F (36.8 C)     Temp Source 12/12/20 0827 Oral     SpO2 12/12/20 0827 98 %     Weight --      Height --      Head Circumference --      Peak Flow --      Pain Score 12/12/20 0826 5     Pain Loc --      Pain Edu? --      Excl. in [place]? --   No data found.  Updated Vital Signs BP (!) 218/77 (BP Location: Left Arm)    Pulse 91    Temp 97.9 F (36.6 C) (Oral)    Resp 20    SpO2 94%   Physical Exam Vitals and nursing note reviewed.  Constitutional:      General: She is not in acute distress.    Appearance: Normal appearance. She is not ill-appearing.  HENT:     Head: Normocephalic and atraumatic.     Mouth/Throat:     Tongue: Lesions (Left side with 3 mm aphthous ulcer and significant swelling, airway patent) present.  Eyes:     General: Lids are normal.        Right eye: No discharge.        Left eye: No discharge.     Extraocular Movements: Extraocular movements intact.     Conjunctiva/sclera: Conjunctivae normal.     Right eye: Right conjunctiva is not injected.     Left eye: Left conjunctiva is not injected.  Neck:     Trachea: Trachea and phonation normal.  Cardiovascular:     Rate and Rhythm:  Normal rate and regular rhythm.     Pulses: Normal pulses.     Heart sounds: Normal heart sounds. No murmur heard.   No friction rub. No gallop.  Pulmonary:     Effort: Pulmonary effort is normal. No accessory muscle usage, prolonged expiration or respiratory distress.     Breath sounds: Normal breath sounds. No stridor, decreased air movement or transmitted upper airway sounds. No decreased breath sounds, wheezing, rhonchi or rales.  Chest:     Chest wall: No tenderness.  Musculoskeletal:        General: Normal range of motion.     Cervical back: Normal range of motion and neck supple. Normal range of motion.  Lymphadenopathy:     Cervical: No cervical adenopathy.  Skin:    General: Skin is warm and dry.     Findings: No erythema or rash.  Neurological:     General: No focal deficit present.     Mental Status: She is alert and oriented to person, place, and time.  Psychiatric:        Mood and Affect: Mood normal.        Behavior: Behavior normal.    Visual Acuity Right Eye Distance:   Left Eye Distance:   Bilateral Distance:    Right Eye Near:   Left Eye Near:    Bilateral Near:     UC Couse / Diagnostics / Procedures:    EKG  Radiology No results found.  Procedures Procedures (including critical care time)  UC Diagnoses / Final Clinical Impressions(s)   I have reviewed the triage vital signs and the nursing notes.  Pertinent labs & imaging results that were available  during my care of the patient were reviewed by me and considered in my medical decision making (see chart for details).   Final diagnoses:  Aphthous ulcer   Patient is requesting antibiotics today.  Patient advised that this is likely a viral or autoimmune etiology but happy to provide her with an antibiotic at her request, advised patient several times that antibiotics may not help at all.  Patient also provided with viscous lidocaine after she reported relief of pain with viscous lidocaine during  her visit today.  Return precautions advised.  ED Prescriptions     Medication Sig Dispense Auth. Provider   lidocaine (XYLOCAINE) 2 % solution Use as directed 15 mLs in the mouth or throat every 3 (three) hours as needed for mouth pain (Sore throat). 300 mL Lynden Oxford Scales, PA-C   amoxicillin-clavulanate (AUGMENTIN) 875-125 MG tablet Take 1 tablet by mouth 2 (two) times daily for 7 days. 14 tablet Lynden Oxford Scales, PA-C      PDMP not reviewed this encounter.  Pending results:  Labs Reviewed - No data to display  Medications Ordered in UC: Medications  lidocaine (XYLOCAINE) 2 % viscous mouth solution 15 mL (15 mLs Mouth/Throat Given 03/30/21 1845)    Disposition Upon Discharge:  Condition: stable for discharge home Home: take medications as prescribed; routine discharge instructions as discussed; follow up as advised.  Patient presented with an acute illness with associated systemic symptoms and significant discomfort requiring urgent management. In my opinion, this is a condition that a prudent lay person (someone who possesses an average knowledge of health and medicine) may potentially expect to result in complications if not addressed urgently such as respiratory distress, impairment of bodily function or dysfunction of bodily organs.   Routine symptom specific, illness specific and/or disease specific instructions were discussed with the patient and/or caregiver at length.   As such, the patient has been evaluated and assessed, work-up was performed and treatment was provided in alignment with urgent care protocols and evidence based medicine.  Patient/parent/caregiver has been advised that the patient may require follow up for further testing and treatment if the symptoms continue in spite of treatment, as clinically indicated and appropriate.  If the patient was tested for COVID-19, Influenza and/or RSV, then the patient/parent/guardian was advised to isolate at home  pending the results of his/her diagnostic coronavirus test and potentially longer if theyre positive. I have also advised pt that if his/her COVID-19 test returns positive, it's recommended to self-isolate for at least 10 days after symptoms first appeared AND until fever-free for 24 hours without fever reducer AND other symptoms have improved or resolved. Discussed self-isolation recommendations as well as instructions for household member/close contacts as per the [facility] and [place] DHHS, and also gave patient the [place] packet with this information.  Patient/parent/caregiver has been advised to return to the [facility] or PCP in 3-5 days if no better; to PCP or the Emergency Department if new signs and symptoms develop, or if the current signs or symptoms continue to change or worsen for further workup, evaluation and treatment as clinically indicated and appropriate  The patient will follow up with their current PCP if and as advised. If the patient does not currently have a PCP we will assist them in obtaining one.   The patient may need specialty follow up if the symptoms continue, in spite of conservative treatment and management, for further workup, evaluation, consultation and treatment as clinically indicated and appropriate.  Patient/parent/caregiver verbalized understanding and agreement  of plan as discussed.  All questions were addressed during visit.  Please see discharge instructions below for further details of plan.  Discharge Instructions:   Discharge Instructions      Please begin amoxicillin/clavulanate 1 tablet twice daily for the next 7 days.  Please also use lidocaine solution every 3 hours as needed for pain.  If your symptoms do not improve in the next few days, please follow-up with your primary care provider.  Your EKG was not concerning for any abnormal findings.  Please be sure you continue regular follow-up with your cardiologist.  Thank you for visiting urgent care.    This  office note has been dictated using Museum/gallery curator.  Unfortunately, and despite my best efforts, this method of dictation can sometimes lead to occasional typographical or grammatical errors.  I apologize in advance if this occurs.     Lynden Oxford Scales, PA-C 03/31/21 [id number]

## 2021-03-30 NOTE — ED Triage Notes (Signed)
Pt reports that since Friday had sore throat and sore on tongue causing not to be able to swallow water. Reports called EMS last night and was told to go to UC today.

## 2021-03-30 NOTE — Discharge Instructions (Addendum)
Please begin amoxicillin/clavulanate 1 tablet twice daily for the next 7 days.  Please also use lidocaine solution every 3 hours as needed for pain.  If your symptoms do not improve in the next few days, please follow-up with your primary care provider.  Your EKG was not concerning for any abnormal findings.  Please be sure you continue regular follow-up with your cardiologist.  Thank you for visiting urgent care.

## 2021-03-31 ENCOUNTER — Telehealth (HOSPITAL_COMMUNITY): Payer: Self-pay

## 2021-03-31 MED ORDER — AMOXICILLIN-POT CLAVULANATE 875-125 MG PO TABS: 1.0000 | ORAL_TABLET | Freq: Two times a day (BID) | ORAL | 0 refills | Status: AC

## 2021-03-31 MED ORDER — LIDOCAINE VISCOUS HCL 2 % MT SOLN: 15.0000 mL | OROMUCOSAL | 0 refills | Status: DC | PRN

## 2022-04-06 ENCOUNTER — Ambulatory Visit (INDEPENDENT_AMBULATORY_CARE_PROVIDER_SITE_OTHER): Payer: 59 | Admitting: Sports Medicine

## 2022-04-06 VITALS — BP 108/49 | Wt 309.0 lb

## 2022-04-06 DIAGNOSIS — M170 Bilateral primary osteoarthritis of knee: Secondary | ICD-10-CM | POA: Diagnosis not present

## 2022-04-06 DIAGNOSIS — M47816 Spondylosis without myelopathy or radiculopathy, lumbar region: Secondary | ICD-10-CM | POA: Diagnosis not present

## 2022-04-06 NOTE — Progress Notes (Signed)
Patient ID: [name] [name], female   DOB: 1950-06-13, 72 y.o.   MRN: [id number]  [name] presents today requesting a prescription for a new wheelchair.  Her current wheelchair is malfunctioning.  It has changed speeds on her unexpectedly which is a safety concern.  She has had someone out to her house twice now to try to fix this problem, but despite their efforts, her wheelchair remains unpredictable and therefore dangerous.  I have provided her with a prescription for a new electric wheelchair.  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  She will follow-up with me as needed.  This note was dictated using Dragon naturally speaking software and may contain errors in syntax, spelling, or content which have not been identified prior to signing this note.

## 2022-05-31 ENCOUNTER — Encounter: Payer: Self-pay | Admitting: *Deleted

## 2022-06-19 ENCOUNTER — Encounter (HOSPITAL_COMMUNITY): Payer: Self-pay

## 2022-06-19 ENCOUNTER — Emergency Department (HOSPITAL_COMMUNITY)
Admission: EM | Admit: 2022-06-19 | Discharge: 2022-06-19 | Disposition: A | Discharge status: Home / Self Care | Payer: 59 | Attending: Emergency Medicine | Admitting: Emergency Medicine

## 2022-06-19 ENCOUNTER — Other Ambulatory Visit: Payer: Self-pay

## 2022-06-19 DIAGNOSIS — Z7901 Long term (current) use of anticoagulants: Secondary | ICD-10-CM | POA: Insufficient documentation

## 2022-06-19 DIAGNOSIS — M79671 Pain in right foot: Secondary | ICD-10-CM | POA: Insufficient documentation

## 2022-06-19 DIAGNOSIS — E119 Type 2 diabetes mellitus without complications: Secondary | ICD-10-CM | POA: Diagnosis not present

## 2022-06-19 MED ORDER — GABAPENTIN 100 MG PO CAPS: 100.0000 mg | ORAL_CAPSULE | Freq: Three times a day (TID) | ORAL | 0 refills | Status: DC

## 2022-06-19 MED ORDER — GABAPENTIN 100 MG PO CAPS
100.0000 mg | ORAL_CAPSULE | Freq: Once | ORAL | Status: AC
  Administered 2022-06-19: 100 mg via ORAL
  Filled 2022-06-19: qty 1

## 2022-06-19 NOTE — ED Provider Notes (Signed)
[facility] EMERGENCY DEPARTMENT AT [facility] Provider Note   CSN: [id number] Arrival date & time: 06/19/22  2035     History  Chief Complaint  Patient presents with   Foot Pain    [name] [name] is a 72 y.o. female.  HPI 72 year old female presents with right foot pain.  This has been ongoing since yesterday but in general has been ongoing for years.  She states no one's been able to tell her why.  She will have periods of time where for couple days she will have intense burning under her right foot, under the dorsal skin.  Feels like there is boiling water and her foot will be hot to the touch.  Usually if she gets out of bed and lays her leg down and will feel better.  No actual skin color change, fever, swelling, trouble breathing, etc.  This occurred last night and then again today when she had told her foot doctor about it they told her she should go to the ER if it happens again.  However otherwise the episode is no different than once been doing for several years.  She has no symptoms right now and the foot is normal size and color to her.  No numbness or weakness.  Home Medications Prior to Admission medications   Medication Sig Start Date End Date Taking? Authorizing Provider  gabapentin (NEURONTIN) 100 MG capsule Take 1 capsule (100 mg total) by mouth 3 (three) times daily. 06/19/22  Yes Pricilla Loveless, MD  albuterol (VENTOLIN HFA) 108 (90 Base) MCG/ACT inhaler Inhale 2 puffs into the lungs every 4 (four) hours as needed for wheezing or shortness of breath. 07/25/20   [provider]  carvedilol (COREG) 25 MG tablet Take 50 mg by mouth 2 (two) times daily. 06/16/20   [provider]  clopidogrel (PLAVIX) 75 MG tablet Take 1 tablet (75 mg total) by mouth daily. 10/11/17   Albertine Grates, MD  cyclobenzaprine (FLEXERIL) 10 MG tablet Take 10 mg by mouth 2 (two) times daily as needed for muscle spasms. 07/16/20   [provider]  diclofenac (VOLTAREN) 75 MG  EC tablet Take 75 mg by mouth daily as needed for mild pain or moderate pain.    [provider]  estradiol (ESTRACE) 0.1 MG/GM vaginal cream Place 1 Applicatorful vaginally daily as needed (vaginal burning). 06/30/20   [provider]  furosemide (LASIX) 20 MG tablet Take 1 tablet (20 mg total) by mouth daily. 10/25/20   Melene Plan, DO  lidocaine (XYLOCAINE) 2 % solution Use as directed 15 mLs in the mouth or throat every 3 (three) hours as needed for mouth pain (Sore throat). 03/31/21   Theadora Rama Scales, PA-C  minoxidil (LONITEN) 2.5 MG tablet Take 5 mg by mouth daily. 06/23/20   [provider]  mupirocin cream (BACTROBAN) 2 % Apply 1 application topically daily as needed (rash under breast).    [provider]  nystatin (MYCOSTATIN/NYSTOP) powder Apply 1 application topically 3 (three) times daily as needed for rash. 06/11/20   [provider]  pantoprazole (PROTONIX) 40 MG tablet Take 40 mg by mouth daily as needed (heartburn).    [provider]  solifenacin (VESICARE) 10 MG tablet Take 10 mg by mouth at bedtime. 07/01/20   [provider]  spironolactone (ALDACTONE) 25 MG tablet Take 25 mg by mouth daily. 06/06/20   [provider]  triamcinolone lotion (KENALOG) 0.1 % Apply 1 application topically daily as needed (Reash  under breast).    [provider]  valsartan (DIOVAN) 320 MG tablet Take 320 mg by mouth at bedtime. 06/24/20   [provider]      Allergies    Metoprolol tartrate    Review of Systems   Review of Systems  Constitutional:  Negative for fever.  Respiratory:  Negative for shortness of breath.   Cardiovascular:  Negative for leg swelling (chronic leg swelling but no worse swelling recently).  Musculoskeletal:  Positive for myalgias.  Skin:  Negative for color change.    Physical Exam Updated Vital Signs BP (!) 129/47   Pulse 83   Temp 97.8 F (36.6 C) (Oral)   Resp 18   SpO2  95%  Physical Exam Vitals and nursing note reviewed.  Constitutional:      Appearance: She is well-developed. She is obese.  HENT:     Head: Normocephalic and atraumatic.  Cardiovascular:     Rate and Rhythm: Normal rate and regular rhythm.     Pulses:          Dorsalis pedis pulses are detected w/ Doppler on the right side.  Pulmonary:     Effort: Pulmonary effort is normal.  Musculoskeletal:     Right lower leg: No tenderness.     Right ankle: Normal range of motion.     Right foot: Normal range of motion. No tenderness.     Comments: Both legs appear chronically swollen but there is no pitting edema.  Both legs are symmetric in size.  Both feet have normal warmth, I see no erythema in either leg.  Her right foot has intact sensation, movement, and no tenderness.  No cellulitis.  Skin:    General: Skin is warm and dry.  Neurological:     Mental Status: She is alert.     ED Results / Procedures / Treatments   Labs (all labs ordered are listed, but only abnormal results are displayed) Labs Reviewed - No data to display  EKG None  Radiology No results found.  Procedures Procedures    Medications Ordered in ED Medications  gabapentin (NEURONTIN) capsule 100 mg (has no administration in time range)    ED Course/ Medical Decision Making/ A&P                             Medical Decision Making Risk Prescription drug management.   Patient presents with an acute exacerbation of chronic right foot pain acute, ongoing.  Originally attributed to her knee surgeries in the past.  No signs of an acute infection such as cellulitis.  I do not feel any significant swelling or induration.  She is obese but everything seems symmetric.  At this point, perhaps this is a neuropathic type pain especially given the chronicity.  She does have a history of diabetes.  Otherwise, with a strong pulse I do not think acute imaging will be helpful and she has had no trauma to suggest needing  x-rays.  Otherwise, I suggested we can try and start her on Neurontin to see if this helps but she should otherwise follow-up with her PCP.  Appears stable for discharge home with return precautions.        Final Clinical Impression(s) / ED Diagnoses Final diagnoses:  Right foot pain    Rx / DC Orders ED Discharge Orders          Ordered    gabapentin (NEURONTIN) 100 MG capsule  3 times daily        06/19/22 2119              Pricilla Loveless, MD 06/19/22 2130

## 2022-06-19 NOTE — Discharge Instructions (Signed)
You are being started on a medicine called Neurontin.  This can help with neuropathic pain or neuropathy.  This can also cause side effects such as dizziness, unsteadiness, and other potential side effects.  If you develop new or worsening pain, fever, color change to the skin, trouble breathing, or any other new/concerning symptoms the ER or call 911.

## 2022-06-19 NOTE — ED Triage Notes (Addendum)
Pt. BIB [facility] c/o R. foot pain that has been worse for the past 2 days. Pt. Had surgery a few years back and has felt numbness and tingling since. She states that it feels like pens and needles are sticking her in her foot. Foot is swollen and warm to touch per EMS.

## 2022-06-24 ENCOUNTER — Telehealth: Payer: Self-pay | Admitting: Adult Health

## 2022-06-24 NOTE — Telephone Encounter (Signed)
Pt called needing to discuss headaches she has been getting on her L side. Pt states she is having pain, pressure & dizziness and she is thinking it may have to do with her BP but would like to be advised.

## 2022-06-25 NOTE — Telephone Encounter (Signed)
Called pt to inform her to follow up with PCP per RN. Pt was was very appreciative and will call PCP.

## 2022-07-08 ENCOUNTER — Emergency Department (HOSPITAL_COMMUNITY)
Admission: EM | Admit: 2022-07-08 | Discharge: 2022-07-08 | Disposition: A | Discharge status: Home / Self Care | Payer: 59 | Attending: Emergency Medicine | Admitting: Emergency Medicine

## 2022-07-08 ENCOUNTER — Other Ambulatory Visit: Payer: Self-pay

## 2022-07-08 ENCOUNTER — Emergency Department (HOSPITAL_COMMUNITY): Payer: 59

## 2022-07-08 DIAGNOSIS — R4781 Slurred speech: Secondary | ICD-10-CM | POA: Diagnosis present

## 2022-07-08 DIAGNOSIS — Z7902 Long term (current) use of antithrombotics/antiplatelets: Secondary | ICD-10-CM | POA: Insufficient documentation

## 2022-07-08 DIAGNOSIS — G459 Transient cerebral ischemic attack, unspecified: Secondary | ICD-10-CM | POA: Diagnosis not present

## 2022-07-08 DIAGNOSIS — E785 Hyperlipidemia, unspecified: Secondary | ICD-10-CM | POA: Diagnosis not present

## 2022-07-08 LAB — COMPREHENSIVE METABOLIC PANEL
ALT: 11 U/L (ref 0–44)
AST: 14 U/L — ABNORMAL LOW (ref 15–41)
Albumin: 3.7 g/dL (ref 3.5–5.0)
Alkaline Phosphatase: 48 U/L (ref 38–126)
Anion gap: 9 (ref 5–15)
BUN: 20 mg/dL (ref 8–23)
CO2: 23 mmol/L (ref 22–32)
Calcium: 9.4 mg/dL (ref 8.9–10.3)
Chloride: 105 mmol/L (ref 98–111)
Creatinine, Ser: 0.91 mg/dL (ref 0.44–1.00)
GFR, Estimated: >60 mL/min (ref >60)
Glucose, Bld: 89 mg/dL (ref 70–99)
Potassium: 4.4 mmol/L (ref 3.5–5.1)
Sodium: 137 mmol/L (ref 135–145)
Total Bilirubin: 0.4 mg/dL (ref 0.3–1.2)
Total Protein: 7 g/dL (ref 6.5–8.1)

## 2022-07-08 LAB — CBC WITH DIFFERENTIAL/PLATELET
Abs Immature Granulocytes: 0.02 10*3/uL (ref 0.00–0.07)
Basophils Absolute: 0 10*3/uL (ref 0.0–0.1)
Basophils Relative: 1 %
Eosinophils Absolute: 0 10*3/uL (ref 0.0–0.5)
Eosinophils Relative: 1 %
HCT: 39.3 % (ref 36.0–46.0)
Hemoglobin: 12.3 g/dL (ref 12.0–15.0)
Immature Granulocytes: 1 %
Lymphocytes Relative: 32 %
Lymphs Abs: 1.3 10*3/uL (ref 0.7–4.0)
MCH: 25.8 pg — ABNORMAL LOW (ref 26.0–34.0)
MCHC: 31.3 g/dL (ref 30.0–36.0)
MCV: 82.4 fL (ref 80.0–100.0)
Monocytes Absolute: 0.5 10*3/uL (ref 0.1–1.0)
Monocytes Relative: 11 %
Neutro Abs: 2.2 10*3/uL (ref 1.7–7.7)
Neutrophils Relative %: 54 %
Platelets: 195 10*3/uL (ref 150–400)
RBC: 4.77 MIL/uL (ref 3.87–5.11)
RDW: 16.1 % — ABNORMAL HIGH (ref 11.5–15.5)
WBC: 4 10*3/uL (ref 4.0–10.5)
nRBC: 0 % (ref 0.0–0.2)

## 2022-07-08 LAB — RAPID URINE DRUG SCREEN, HOSP PERFORMED
Amphetamines: NOT DETECTED
Barbiturates: NOT DETECTED
Benzodiazepines: NOT DETECTED
Cocaine: NOT DETECTED
Opiates: NOT DETECTED
Tetrahydrocannabinol: NOT DETECTED

## 2022-07-08 LAB — URINALYSIS, ROUTINE W REFLEX MICROSCOPIC
Bilirubin Urine: NEGATIVE
Glucose, UA: NEGATIVE mg/dL
Hgb urine dipstick: NEGATIVE
Ketones, ur: NEGATIVE mg/dL
Nitrite: NEGATIVE
Protein, ur: NEGATIVE mg/dL
Specific Gravity, Urine: 1.009 (ref 1.005–1.030)
pH: 7 (ref 5.0–8.0)

## 2022-07-08 LAB — TROPONIN I (HIGH SENSITIVITY): Troponin I (High Sensitivity): 7 ng/L (ref <18)

## 2022-07-08 MED ORDER — DIAZEPAM 5 MG/ML IJ SOLN: 5.0000 mg | Freq: Once | INTRAMUSCULAR | Status: DC | PRN

## 2022-07-08 MED ORDER — FOSFOMYCIN TROMETHAMINE 3 G PO PACK: 3.0000 g | PACK | Freq: Once | ORAL | 0 refills | Status: AC

## 2022-07-08 MED ORDER — FOSFOMYCIN TROMETHAMINE 3 G PO PACK
3.0000 g | PACK | Freq: Once | ORAL | Status: DC
  Filled 2022-07-08: qty 3

## 2022-07-08 NOTE — ED Notes (Signed)
Pt to MRI

## 2022-07-08 NOTE — ED Notes (Signed)
Provider at bedside

## 2022-07-08 NOTE — Discharge Instructions (Addendum)
You have adamantly refused MRI, it is possible that your history of stroke, that paralysis, however you refused MRI.  Please follow-up with your neurologist, you understand the risk of this, and that your condition may worsen.  There is no way to tell if you're having strokes w/o the MRI. You may return to the ED for further evaluation at any time.

## 2022-07-08 NOTE — ED Notes (Signed)
NAD noted, respirations are equal bilaterally and unlabored at this time. Pt resting in gurney and denies any unmet needs. Pt connected to pulseox & BP. Call light within reach. 

## 2022-07-08 NOTE — ED Provider Notes (Signed)
[place] [facility] AT [facility] Provider Note   CSN: [id number] Arrival date & time: 07/08/22  1205     History  Chief Complaint  Patient presents with   Transient Ischemic Attack   Aphasia    [name] [name] is a 72 y.o. female, history of CVA, hyperlipidemia, who presents to the ED secondary to 5 episodes of slurred speech, garbled speech that is been going on for the last 2 weeks.  She states she has about 2 episodes a week, and that they are random.  Typically associated with a headache that goes from the left side of her head to the right side of her head, and she has garbled speech, with inappropriate words, and slurred speech during these episodes.  These episodes last about 1 to 2 hours.  Of note she does not have any weakness on one side of her body, facial droop, difficulty swallowing, during these episodes.  She notes that they are random at times, typically associated with a headache, and that the headache resolves after she takes some Tylenol and that her symptoms resolve after taking the Tylenol.  She notes that she had these issues in 2020, when she had her stroke, and of note was previously on Plavix, but for the last week has been transitioned off to baby aspirin, instead due to his surgery.  She called her primary care doctor doctor's office, today because she was having an episode lasting from 9 30-11 o'clock, with the slurred speech, garbled speech that went away.  All symptoms are resolved now.  She said with this episode she had some right arm pain, that was stabbing going from the shoulder to the elbow, but now has resolved.  Home Medications Prior to Admission medications   Medication Sig Start Date End Date Taking? Authorizing Provider  fosfomycin (MONUROL) 3 g PACK Take 3 g by mouth once for 1 dose. 07/08/22 07/08/22 Yes Kendre Jacinto L, PA  albuterol (VENTOLIN HFA) 108 (90 Base) MCG/ACT inhaler Inhale 2 puffs into the lungs every 4 (four) hours  as needed for wheezing or shortness of breath. 07/25/20   [provider]  carvedilol (COREG) 25 MG tablet Take 50 mg by mouth 2 (two) times daily. 06/16/20   [provider]  clopidogrel (PLAVIX) 75 MG tablet Take 1 tablet (75 mg total) by mouth daily. 10/11/17   Albertine Grates, MD  cyclobenzaprine (FLEXERIL) 10 MG tablet Take 10 mg by mouth 2 (two) times daily as needed for muscle spasms. 07/16/20   [provider]  diclofenac (VOLTAREN) 75 MG EC tablet Take 75 mg by mouth daily as needed for mild pain or moderate pain.    [provider]  estradiol (ESTRACE) 0.1 MG/GM vaginal cream Place 1 Applicatorful vaginally daily as needed (vaginal burning). 06/30/20   [provider]  furosemide (LASIX) 20 MG tablet Take 1 tablet (20 mg total) by mouth daily. 10/25/20   Melene Plan, DO  gabapentin (NEURONTIN) 100 MG capsule Take 1 capsule (100 mg total) by mouth 3 (three) times daily. 06/19/22   Pricilla Loveless, MD  lidocaine (XYLOCAINE) 2 % solution Use as directed 15 mLs in the mouth or throat every 3 (three) hours as needed for mouth pain (Sore throat). 03/31/21   Theadora Rama Scales, PA-C  minoxidil (LONITEN) 2.5 MG tablet Take 5 mg by mouth daily. 06/23/20   [provider]  mupirocin cream (BACTROBAN) 2 % Apply 1 application topically daily as needed (rash under breast).  [provider]  nystatin (MYCOSTATIN/NYSTOP) powder Apply 1 application topically 3 (three) times daily as needed for rash. 06/11/20   [provider]  pantoprazole (PROTONIX) 40 MG tablet Take 40 mg by mouth daily as needed (heartburn).    [provider]  solifenacin (VESICARE) 10 MG tablet Take 10 mg by mouth at bedtime. 07/01/20   [provider]  spironolactone (ALDACTONE) 25 MG tablet Take 25 mg by mouth daily. 06/06/20   [provider]  triamcinolone lotion (KENALOG) 0.1 % Apply 1 application topically daily as needed (Reash under breast).     [provider]  valsartan (DIOVAN) 320 MG tablet Take 320 mg by mouth at bedtime. 06/24/20   [provider]      Allergies    Metoprolol tartrate    Review of Systems   Review of Systems  Neurological:  Positive for speech difficulty and headaches. Negative for dizziness and facial asymmetry.    Physical Exam Updated Vital Signs BP (!) 184/76 (BP Location: Right Arm)   Pulse 74   Temp 98 F (36.7 C) (Oral)   Resp 19   Ht 5\' 4"  (1.626 m)   Wt (!) 154.2 kg   SpO2 99%   BMI 58.36 kg/m  Physical Exam Vitals and nursing note reviewed.  Constitutional:      General: She is not in acute distress.    Appearance: She is well-developed.  HENT:     Head: Normocephalic and atraumatic.  Eyes:     Conjunctiva/sclera: Conjunctivae normal.  Cardiovascular:     Rate and Rhythm: Normal rate and regular rhythm.     Heart sounds: No murmur heard. Pulmonary:     Effort: Pulmonary effort is normal. No respiratory distress.     Breath sounds: Normal breath sounds.  Abdominal:     Palpations: Abdomen is soft.     Tenderness: There is no abdominal tenderness.  Musculoskeletal:        General: No swelling.     Cervical back: Neck supple.  Skin:    General: Skin is warm and dry.     Capillary Refill: Capillary refill takes less than 2 seconds.  Neurological:     General: No focal deficit present.     Mental Status: She is alert and oriented to person, place, and time.     Cranial Nerves: No cranial nerve deficit.     Sensory: No sensory deficit.     Motor: No weakness.  Psychiatric:        Mood and Affect: Mood normal.     ED Results / Procedures / Treatments   Labs (all labs ordered are listed, but only abnormal results are displayed) Labs Reviewed  COMPREHENSIVE METABOLIC PANEL - Abnormal; Notable for the following components:      Result Value   AST 14 (*)    All other components within normal limits  CBC WITH DIFFERENTIAL/PLATELET - Abnormal; Notable for  the following components:   MCH 25.8 (*)    RDW 16.1 (*)    All other components within normal limits  URINALYSIS, ROUTINE W REFLEX MICROSCOPIC - Abnormal; Notable for the following components:   Color, Urine STRAW (*)    Leukocytes,Ua LARGE (*)    Bacteria, UA RARE (*)    All other components within normal limits  RAPID URINE DRUG SCREEN, HOSP PERFORMED  TROPONIN I (HIGH SENSITIVITY)  TROPONIN I (HIGH SENSITIVITY)    EKG EKG Interpretation  Date/Time:  Thursday Jul 08 2022 12:16:26  EDT Ventricular Rate:  77 PR Interval:  150 QRS Duration: 104 QT Interval:  421 QTC Calculation: 477 R Axis:   86 Text Interpretation: Sinus rhythm Borderline right axis deviation No significant change was found Confirmed by Glynn Octave [phone]) on 07/08/2022 12:18:45 PM  Radiology DG Chest 2 View  Result Date: 07/08/2022 CLINICAL DATA:  Right arm pain.  Aphasia for 1 day. EXAM: CHEST - 2 VIEW COMPARISON:  10/25/2020 FINDINGS: Exam detail diminished secondary to portable technique and patient body habitus. Cardiac enlargement is extension weighted by AP technique. Pulmonary interstitial prominence is similar to previous exam. No signs of pleural effusion or airspace consolidation. Spondylosis noted within the thoracic spine. IMPRESSION: Cardiac enlargement and pulmonary interstitial prominence. Findings may reflect chronic interstitial lung disease versus mild CHF. Electronically Signed   By: Signa Kell M.D.   On: 07/08/2022 13:26   CT Head Wo Contrast  Result Date: 07/08/2022 CLINICAL DATA:  Transient ischemic attack.  Aphasia EXAM: CT HEAD WITHOUT CONTRAST TECHNIQUE: Contiguous axial images were obtained from the base of the skull through the vertex without intravenous contrast. RADIATION DOSE REDUCTION: This exam was performed according to the departmental dose-optimization program which includes automated exposure control, adjustment of the mA and/or kV according to patient size and/or use of  iterative reconstruction technique. COMPARISON:  10/06/2017 FINDINGS: Brain: There is no evidence for acute hemorrhage, hydrocephalus, mass lesion, or abnormal extra-axial fluid collection. No definite CT evidence for acute infarction. Multiple bilateral lacunar infarctions are seen in the basal ganglia. Diffuse loss of parenchymal volume is consistent with atrophy. Patchy low attenuation in the deep hemispheric and periventricular white matter is nonspecific, but likely reflects chronic microvascular ischemic demyelination. Vascular: No hyperdense vessel or unexpected calcification. Skull: No evidence for fracture. No worrisome lytic or sclerotic lesion. Sinuses/Orbits: The visualized paranasal sinuses and mastoid air cells are clear. Visualized portions of the globes and intraorbital fat are unremarkable. Other: None. IMPRESSION: 1. No acute intracranial abnormality. 2. Atrophy with chronic Hayla Hinger vessel ischemic disease. Electronically Signed   By: Kennith Center M.D.   On: 07/08/2022 13:14    Procedures Procedures    Medications Ordered in ED Medications  diazepam (VALIUM) injection 5 mg (5 mg Intravenous Not Given 07/08/22 1737)  fosfomycin (MONUROL) packet 3 g (has no administration in time range)    ED Course/ Medical Decision Making/ A&P                             Medical Decision Making Patient is a 72 year old female, history of CVA, here for concern for possible other TIAs/CVAs.  She has had recurrent episodes of aphasia, with headache, has been going on for the last 2 weeks.  She has had 5 episodes of far.  Her last episode was earlier today.  She has no acute neurodeficits on exam.  We will obtain troponins, chest x-ray given her right arm pain, as well as a CT head, MRI, evaluation for CVA.  Amount and/or Complexity of Data Reviewed Labs: ordered.    Details: Troponin within normal limits, UA with large leukocytes, rare bacteria Radiology: ordered.    Details: Chest x-ray shows  possible CHF, versus chronic interstitial lung disease, CT shows no acute findings, old nuclear infarcts, Jannelle Notaro vessel disease Discussion of management or test interpretation with external provider(s): Discussed with patient, she adamantly refuses to do an MRI, we discussed putting her on oxygen and using Versed to help with MRI, to  help ease it, she adamantly refuses, I discussed that this is the only way to evaluate if she is having strokes, I discussed this with neurology, Courtney nurse practitioner, and she agrees, I stressed the importance of getting the MRI, to help further manage patient, and she adamantly declines.  She understands the risk associated with this, and is chosen to leave AGAINST MEDICAL ADVICE.  She understands that she can return at any time.  Fosfomycin sent to pharmacy.  Risk Prescription drug management.    Final Clinical Impression(s) / ED Diagnoses Final diagnoses:  TIA (transient ischemic attack)    Rx / DC Orders ED Discharge Orders          Ordered    fosfomycin (MONUROL) 3 g PACK   Once        07/08/22 1751              Iara Monds, [place], PA 07/08/22 1751    Glynn Octave, MD 07/09/22 [phone]

## 2022-07-08 NOTE — ED Triage Notes (Signed)
Patient was brought in by [facility] from home. She currently lives with her daughter. She has a history of TIAs. Patient states that this TIA started about 2 weeks ago. And that this attacks started about 9am. Last occurrence was 2020. Patient has hx of TIAs. Pt was having slurred speech, global aphasia, and headaches. Patient states that she was taken off her Plavix about 1 week ago for a surgery that is scheduled for June 24th.

## 2022-07-08 NOTE — ED Notes (Signed)
Pt unable to complete MRI

## 2022-07-08 NOTE — ED Notes (Signed)
Pt leaving AMA. PIVs removed. Pt well appearing upon discharge and reports tolerable pain. Pt wheeled to exit. Pt endorses ride home.

## 2022-07-22 ENCOUNTER — Other Ambulatory Visit: Payer: Self-pay | Admitting: Internal Medicine

## 2022-07-22 DIAGNOSIS — R2990 Unspecified symptoms and signs involving the nervous system: Secondary | ICD-10-CM

## 2022-07-27 ENCOUNTER — Ambulatory Visit (HOSPITAL_COMMUNITY)
Admission: RE | Admit: 2022-07-27 | Discharge: 2022-07-27 | Disposition: A | Discharge status: Home / Self Care | Payer: 59 | Source: Ambulatory Visit | Attending: Internal Medicine | Admitting: Internal Medicine

## 2022-07-27 ENCOUNTER — Encounter (HOSPITAL_COMMUNITY): Payer: Self-pay

## 2022-07-27 DIAGNOSIS — R2990 Unspecified symptoms and signs involving the nervous system: Secondary | ICD-10-CM

## 2022-08-10 ENCOUNTER — Ambulatory Visit (HOSPITAL_COMMUNITY): Payer: 59

## 2022-08-11 ENCOUNTER — Inpatient Hospital Stay (HOSPITAL_COMMUNITY)
Admission: EM | Admit: 2022-08-11 | Discharge: 2022-08-15 | DRG: 023 | Disposition: A | Discharge status: Home / Self Care | Payer: 59 | Attending: Neurology | Admitting: Neurology

## 2022-08-11 ENCOUNTER — Encounter (HOSPITAL_COMMUNITY): Payer: Self-pay

## 2022-08-11 ENCOUNTER — Encounter (HOSPITAL_COMMUNITY)
Admission: EM | Disposition: A | Discharge status: Home / Self Care | Payer: Self-pay | Source: Home / Self Care | Attending: Neurology

## 2022-08-11 ENCOUNTER — Emergency Department (HOSPITAL_COMMUNITY): Payer: 59

## 2022-08-11 ENCOUNTER — Other Ambulatory Visit: Payer: Self-pay

## 2022-08-11 ENCOUNTER — Emergency Department (HOSPITAL_COMMUNITY): Payer: 59 | Admitting: Anesthesiology

## 2022-08-11 DIAGNOSIS — G4733 Obstructive sleep apnea (adult) (pediatric): Secondary | ICD-10-CM | POA: Diagnosis present

## 2022-08-11 DIAGNOSIS — G4730 Sleep apnea, unspecified: Secondary | ICD-10-CM

## 2022-08-11 DIAGNOSIS — I639 Cerebral infarction, unspecified: Secondary | ICD-10-CM | POA: Diagnosis not present

## 2022-08-11 DIAGNOSIS — I6602 Occlusion and stenosis of left middle cerebral artery: Secondary | ICD-10-CM | POA: Diagnosis not present

## 2022-08-11 DIAGNOSIS — R4701 Aphasia: Secondary | ICD-10-CM | POA: Diagnosis present

## 2022-08-11 DIAGNOSIS — I63311 Cerebral infarction due to thrombosis of right middle cerebral artery: Secondary | ICD-10-CM

## 2022-08-11 DIAGNOSIS — Z823 Family history of stroke: Secondary | ICD-10-CM

## 2022-08-11 DIAGNOSIS — R29704 NIHSS score 4: Secondary | ICD-10-CM | POA: Diagnosis present

## 2022-08-11 DIAGNOSIS — I6522 Occlusion and stenosis of left carotid artery: Secondary | ICD-10-CM | POA: Diagnosis present

## 2022-08-11 DIAGNOSIS — R911 Solitary pulmonary nodule: Secondary | ICD-10-CM | POA: Diagnosis present

## 2022-08-11 DIAGNOSIS — R471 Dysarthria and anarthria: Secondary | ICD-10-CM | POA: Diagnosis present

## 2022-08-11 DIAGNOSIS — Z87891 Personal history of nicotine dependence: Secondary | ICD-10-CM | POA: Diagnosis not present

## 2022-08-11 DIAGNOSIS — R451 Restlessness and agitation: Secondary | ICD-10-CM | POA: Diagnosis not present

## 2022-08-11 DIAGNOSIS — Z8673 Personal history of transient ischemic attack (TIA), and cerebral infarction without residual deficits: Secondary | ICD-10-CM

## 2022-08-11 DIAGNOSIS — Z79890 Hormone replacement therapy (postmenopausal): Secondary | ICD-10-CM

## 2022-08-11 DIAGNOSIS — N179 Acute kidney failure, unspecified: Secondary | ICD-10-CM | POA: Diagnosis present

## 2022-08-11 DIAGNOSIS — Z713 Dietary counseling and surveillance: Secondary | ICD-10-CM

## 2022-08-11 DIAGNOSIS — N3281 Overactive bladder: Secondary | ICD-10-CM | POA: Diagnosis present

## 2022-08-11 DIAGNOSIS — E119 Type 2 diabetes mellitus without complications: Secondary | ICD-10-CM | POA: Diagnosis present

## 2022-08-11 DIAGNOSIS — Z79899 Other long term (current) drug therapy: Secondary | ICD-10-CM

## 2022-08-11 DIAGNOSIS — I959 Hypotension, unspecified: Secondary | ICD-10-CM | POA: Diagnosis not present

## 2022-08-11 DIAGNOSIS — G8929 Other chronic pain: Secondary | ICD-10-CM | POA: Diagnosis present

## 2022-08-11 DIAGNOSIS — Z6841 Body Mass Index (BMI) 40.0 and over, adult: Secondary | ICD-10-CM

## 2022-08-11 DIAGNOSIS — R1310 Dysphagia, unspecified: Secondary | ICD-10-CM | POA: Diagnosis present

## 2022-08-11 DIAGNOSIS — Z8249 Family history of ischemic heart disease and other diseases of the circulatory system: Secondary | ICD-10-CM | POA: Diagnosis not present

## 2022-08-11 DIAGNOSIS — I63232 Cerebral infarction due to unspecified occlusion or stenosis of left carotid arteries: Secondary | ICD-10-CM | POA: Diagnosis not present

## 2022-08-11 DIAGNOSIS — E785 Hyperlipidemia, unspecified: Secondary | ICD-10-CM | POA: Diagnosis present

## 2022-08-11 DIAGNOSIS — Z7902 Long term (current) use of antithrombotics/antiplatelets: Secondary | ICD-10-CM

## 2022-08-11 DIAGNOSIS — I10 Essential (primary) hypertension: Secondary | ICD-10-CM | POA: Diagnosis not present

## 2022-08-11 DIAGNOSIS — Z888 Allergy status to other drugs, medicaments and biological substances status: Secondary | ICD-10-CM

## 2022-08-11 DIAGNOSIS — S301XXA Contusion of abdominal wall, initial encounter: Secondary | ICD-10-CM | POA: Diagnosis not present

## 2022-08-11 DIAGNOSIS — I63512 Cerebral infarction due to unspecified occlusion or stenosis of left middle cerebral artery: Secondary | ICD-10-CM | POA: Diagnosis present

## 2022-08-11 DIAGNOSIS — X58XXXA Exposure to other specified factors, initial encounter: Secondary | ICD-10-CM | POA: Diagnosis not present

## 2022-08-11 DIAGNOSIS — I6389 Other cerebral infarction: Secondary | ICD-10-CM | POA: Diagnosis not present

## 2022-08-11 DIAGNOSIS — J9690 Respiratory failure, unspecified, unspecified whether with hypoxia or hypercapnia: Secondary | ICD-10-CM | POA: Diagnosis not present

## 2022-08-11 DIAGNOSIS — I724 Aneurysm of artery of lower extremity: Secondary | ICD-10-CM | POA: Diagnosis not present

## 2022-08-11 DIAGNOSIS — Z96653 Presence of artificial knee joint, bilateral: Secondary | ICD-10-CM | POA: Diagnosis present

## 2022-08-11 DIAGNOSIS — I6300 Cerebral infarction due to thrombosis of unspecified precerebral artery: Secondary | ICD-10-CM | POA: Diagnosis not present

## 2022-08-11 DIAGNOSIS — Z978 Presence of other specified devices: Secondary | ICD-10-CM | POA: Diagnosis not present

## 2022-08-11 DIAGNOSIS — R579 Shock, unspecified: Secondary | ICD-10-CM | POA: Diagnosis not present

## 2022-08-11 DIAGNOSIS — I63312 Cerebral infarction due to thrombosis of left middle cerebral artery: Secondary | ICD-10-CM | POA: Diagnosis not present

## 2022-08-11 HISTORY — PX: RADIOLOGY WITH ANESTHESIA: SHX6223

## 2022-08-11 LAB — COMPREHENSIVE METABOLIC PANEL
ALT: 9 U/L (ref 0–44)
AST: 14 U/L — ABNORMAL LOW (ref 15–41)
Albumin: 3.8 g/dL (ref 3.5–5.0)
Alkaline Phosphatase: 52 U/L (ref 38–126)
Anion gap: 8 (ref 5–15)
BUN: 10 mg/dL (ref 8–23)
CO2: 26 mmol/L (ref 22–32)
Calcium: 9.7 mg/dL (ref 8.9–10.3)
Chloride: 105 mmol/L (ref 98–111)
Creatinine, Ser: 1.39 mg/dL — ABNORMAL HIGH (ref 0.44–1.00)
GFR, Estimated: 41 mL/min — ABNORMAL LOW (ref >60)
Glucose, Bld: 107 mg/dL — ABNORMAL HIGH (ref 70–99)
Potassium: 4 mmol/L (ref 3.5–5.1)
Sodium: 139 mmol/L (ref 135–145)
Total Bilirubin: 1 mg/dL (ref 0.3–1.2)
Total Protein: 7 g/dL (ref 6.5–8.1)

## 2022-08-11 LAB — I-STAT CHEM 8, ED
BUN: 11 mg/dL (ref 8–23)
Calcium, Ion: 1.23 mmol/L (ref 1.15–1.40)
Chloride: 105 mmol/L (ref 98–111)
Creatinine, Ser: 1.4 mg/dL — ABNORMAL HIGH (ref 0.44–1.00)
Glucose, Bld: 104 mg/dL — ABNORMAL HIGH (ref 70–99)
HCT: 47 % — ABNORMAL HIGH (ref 36.0–46.0)
Hemoglobin: 16 g/dL — ABNORMAL HIGH (ref 12.0–15.0)
Potassium: 4 mmol/L (ref 3.5–5.1)
Sodium: 141 mmol/L (ref 135–145)
TCO2: 25 mmol/L (ref 22–32)

## 2022-08-11 LAB — DIFFERENTIAL
Abs Immature Granulocytes: 0.01 10*3/uL (ref 0.00–0.07)
Basophils Absolute: 0 10*3/uL (ref 0.0–0.1)
Basophils Relative: 1 %
Eosinophils Absolute: 0 10*3/uL (ref 0.0–0.5)
Eosinophils Relative: 0 %
Immature Granulocytes: 0 %
Lymphocytes Relative: 29 %
Lymphs Abs: 1.3 10*3/uL (ref 0.7–4.0)
Monocytes Absolute: 0.5 10*3/uL (ref 0.1–1.0)
Monocytes Relative: 11 %
Neutro Abs: 2.7 10*3/uL (ref 1.7–7.7)
Neutrophils Relative %: 59 %

## 2022-08-11 LAB — PROTIME-INR
INR: 1.1 (ref 0.8–1.2)
Prothrombin Time: 14.2 seconds (ref 11.4–15.2)

## 2022-08-11 LAB — ETHANOL: Alcohol, Ethyl (B): <10 mg/dL (ref <10)

## 2022-08-11 LAB — CBC
HCT: 46.1 % — ABNORMAL HIGH (ref 36.0–46.0)
Hemoglobin: 14.3 g/dL (ref 12.0–15.0)
MCH: 25.6 pg — ABNORMAL LOW (ref 26.0–34.0)
MCHC: 31 g/dL (ref 30.0–36.0)
MCV: 82.6 fL (ref 80.0–100.0)
Platelets: 208 10*3/uL (ref 150–400)
RBC: 5.58 MIL/uL — ABNORMAL HIGH (ref 3.87–5.11)
RDW: 16.5 % — ABNORMAL HIGH (ref 11.5–15.5)
WBC: 4.6 10*3/uL (ref 4.0–10.5)
nRBC: 0 % (ref 0.0–0.2)

## 2022-08-11 LAB — APTT: aPTT: 28 seconds (ref 24–36)

## 2022-08-11 LAB — CBG MONITORING, ED: Glucose-Capillary: 112 mg/dL — ABNORMAL HIGH (ref 70–99)

## 2022-08-11 SURGERY — IR WITH ANESTHESIA
Anesthesia: General

## 2022-08-11 MED ORDER — SENNOSIDES-DOCUSATE SODIUM 8.6-50 MG PO TABS
1.0000 | ORAL_TABLET | Freq: Every evening | ORAL | Status: DC | PRN
  Filled 2022-08-11: qty 1

## 2022-08-11 MED ORDER — CLEVIDIPINE BUTYRATE 0.5 MG/ML IV EMUL
INTRAVENOUS | Status: DC | PRN
  Administered 2022-08-11: 2 mg/h via INTRAVENOUS

## 2022-08-11 MED ORDER — PHENYLEPHRINE HCL (PRESSORS) 10 MG/ML IV SOLN
INTRAVENOUS | Status: DC | PRN
  Administered 2022-08-11: 160 ug via SUBCUTANEOUS
  Administered 2022-08-11: 80 ug via SUBCUTANEOUS

## 2022-08-11 MED ORDER — ACETAMINOPHEN 325 MG PO TABS: 650.0000 mg | ORAL_TABLET | ORAL | Status: DC | PRN

## 2022-08-11 MED ORDER — ACETAMINOPHEN 160 MG/5ML PO SOLN: 650.0000 mg | ORAL | Status: DC | PRN

## 2022-08-11 MED ORDER — INSULIN ASPART 100 UNIT/ML IJ SOLN
0.0000 [IU] | INTRAMUSCULAR | Status: DC
  Administered 2022-08-12: 4 [IU] via SUBCUTANEOUS
  Administered 2022-08-12 – 2022-08-14 (×4): 3 [IU] via SUBCUTANEOUS

## 2022-08-11 MED ORDER — FENTANYL CITRATE (PF) 100 MCG/2ML IJ SOLN
INTRAMUSCULAR | Status: DC | PRN
  Administered 2022-08-11: 50 ug via INTRAVENOUS

## 2022-08-11 MED ORDER — SODIUM CHLORIDE 0.9% FLUSH
3.0000 mL | Freq: Once | INTRAVENOUS | Status: AC
  Administered 2022-08-11: 3 mL via INTRAVENOUS

## 2022-08-11 MED ORDER — CARVEDILOL 12.5 MG PO TABS: 50.0000 mg | ORAL_TABLET | Freq: Two times a day (BID) | ORAL | Status: DC

## 2022-08-11 MED ORDER — ACETAMINOPHEN 325 MG PO TABS
650.0000 mg | ORAL_TABLET | ORAL | Status: DC | PRN
  Administered 2022-08-13 – 2022-08-14 (×3): 650 mg via ORAL
  Filled 2022-08-11 (×4): qty 2

## 2022-08-11 MED ORDER — IRBESARTAN 75 MG PO TABS: 37.5000 mg | ORAL_TABLET | Freq: Every day | ORAL | Status: DC

## 2022-08-11 MED ORDER — CHLORHEXIDINE GLUCONATE CLOTH 2 % EX PADS
6.0000 | MEDICATED_PAD | Freq: Every day | CUTANEOUS | Status: DC
  Administered 2022-08-12 – 2022-08-14 (×4): 6 via TOPICAL

## 2022-08-11 MED ORDER — PROPOFOL 500 MG/50ML IV EMUL
INTRAVENOUS | Status: DC | PRN
  Administered 2022-08-11: 50 ug/kg/min via INTRAVENOUS

## 2022-08-11 MED ORDER — LIDOCAINE HCL (CARDIAC) PF 100 MG/5ML IV SOSY
PREFILLED_SYRINGE | INTRAVENOUS | Status: DC | PRN
  Administered 2022-08-11: 100 mg via INTRATRACHEAL

## 2022-08-11 MED ORDER — ALBUTEROL SULFATE HFA 108 (90 BASE) MCG/ACT IN AERS: 2.0000 | INHALATION_SPRAY | RESPIRATORY_TRACT | Status: DC | PRN

## 2022-08-11 MED ORDER — PHENYLEPHRINE HCL-NACL 20-0.9 MG/250ML-% IV SOLN
INTRAVENOUS | Status: DC | PRN
  Administered 2022-08-11: 25 ug/min via INTRAVENOUS

## 2022-08-11 MED ORDER — ACETAMINOPHEN 650 MG RE SUPP: 650.0000 mg | RECTAL | Status: DC | PRN

## 2022-08-11 MED ORDER — ROCURONIUM BROMIDE 10 MG/ML (PF) SYRINGE
PREFILLED_SYRINGE | INTRAVENOUS | Status: DC | PRN
  Administered 2022-08-11: 50 mg via INTRAVENOUS
  Administered 2022-08-11 (×2): 10 mg via INTRAVENOUS
  Administered 2022-08-12: 40 mg via INTRAVENOUS

## 2022-08-11 MED ORDER — CARVEDILOL 12.5 MG PO TABS: 25.0000 mg | ORAL_TABLET | Freq: Two times a day (BID) | ORAL | Status: DC

## 2022-08-11 MED ORDER — DEXAMETHASONE SODIUM PHOSPHATE 10 MG/ML IJ SOLN
INTRAMUSCULAR | Status: DC | PRN
  Administered 2022-08-11: 5 mg via INTRAVENOUS

## 2022-08-11 MED ORDER — SODIUM CHLORIDE 0.9 % IV SOLN: INTRAVENOUS | Status: DC | PRN

## 2022-08-11 MED ORDER — FENTANYL CITRATE (PF) 100 MCG/2ML IJ SOLN
INTRAMUSCULAR | Status: AC
  Filled 2022-08-11: qty 2

## 2022-08-11 MED ORDER — ORAL CARE MOUTH RINSE: 15.0000 mL | OROMUCOSAL | Status: DC | PRN

## 2022-08-11 MED ORDER — SODIUM CHLORIDE 0.9 % IV SOLN: INTRAVENOUS | Status: DC

## 2022-08-11 MED ORDER — SUGAMMADEX SODIUM 200 MG/2ML IV SOLN
INTRAVENOUS | Status: DC | PRN
  Administered 2022-08-11: 400 mg via INTRAVENOUS

## 2022-08-11 MED ORDER — ONDANSETRON HCL 4 MG/2ML IJ SOLN
INTRAMUSCULAR | Status: DC | PRN
  Administered 2022-08-11: 4 mg via INTRAVENOUS

## 2022-08-11 MED ORDER — SPIRONOLACTONE 12.5 MG HALF TABLET: 25.0000 mg | ORAL_TABLET | Freq: Every day | ORAL | Status: DC

## 2022-08-11 MED ORDER — CEFAZOLIN SODIUM-DEXTROSE 2-3 GM-%(50ML) IV SOLR
INTRAVENOUS | Status: DC | PRN
  Administered 2022-08-11: 2 g via INTRAVENOUS

## 2022-08-11 MED ORDER — IOHEXOL 350 MG/ML SOLN
75.0000 mL | Freq: Once | INTRAVENOUS | Status: AC | PRN
  Administered 2022-08-11: 75 mL via INTRAVENOUS

## 2022-08-11 MED ORDER — CLEVIDIPINE BUTYRATE 0.5 MG/ML IV EMUL: 0.0000 mg/h | INTRAVENOUS | Status: DC

## 2022-08-11 MED ORDER — IOHEXOL 300 MG/ML  SOLN
150.0000 mL | Freq: Once | INTRAMUSCULAR | Status: AC | PRN
  Administered 2022-08-11: 64 mL via INTRA_ARTERIAL

## 2022-08-11 MED ORDER — SODIUM CHLORIDE 0.9 % IV BOLUS: 250.0000 mL | INTRAVENOUS | Status: AC | PRN

## 2022-08-11 MED ORDER — EPHEDRINE SULFATE-NACL 50-0.9 MG/10ML-% IV SOSY
PREFILLED_SYRINGE | INTRAVENOUS | Status: DC | PRN
  Administered 2022-08-11: 10 mg via INTRAVENOUS
  Administered 2022-08-11: 5 mg via INTRAVENOUS
  Administered 2022-08-11: 10 mg via INTRAVENOUS

## 2022-08-11 MED ORDER — PROPOFOL 10 MG/ML IV BOLUS
INTRAVENOUS | Status: DC | PRN
  Administered 2022-08-11: 50 mg via INTRAVENOUS
  Administered 2022-08-11: 160 mg via INTRAVENOUS
  Administered 2022-08-11: 100 mg via INTRAVENOUS

## 2022-08-11 MED ORDER — PHENYLEPHRINE HCL (PRESSORS) 10 MG/ML IV SOLN
INTRAVENOUS | Status: DC | PRN
  Administered 2022-08-11 (×5): 80 ug via INTRAVENOUS
  Administered 2022-08-11: 160 ug via INTRAVENOUS

## 2022-08-11 MED ORDER — SUCCINYLCHOLINE CHLORIDE 200 MG/10ML IV SOSY
PREFILLED_SYRINGE | INTRAVENOUS | Status: DC | PRN
  Administered 2022-08-11: 150 mg via INTRAVENOUS
  Administered 2022-08-11: 120 mg via INTRAVENOUS

## 2022-08-11 MED ORDER — STROKE: EARLY STAGES OF RECOVERY BOOK
Freq: Once | Status: AC
  Administered 2022-08-12: 1
  Filled 2022-08-11: qty 1

## 2022-08-11 MED ORDER — LACTATED RINGERS IV SOLN: INTRAVENOUS | Status: DC | PRN

## 2022-08-11 NOTE — Procedures (Incomplete)
INTERVENTIONAL NEURORADIOLOGY BRIEF POSTPROCEDURE NOTE  DIAGNOSTIC CEREBRAL ANGIOGRAM AND MECHANICAL THROMBECTOMY  Attending: Dr. Baldemar Lenis  Diagnosis: Left M3 occlusion.  Access site: RCFA  Access closure: 20F angioseal + manual pressure  Anesthesia: GETA  Medication used: Ancef 3 g IV.  Complications: None.  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 Mechanical thrombectomy performed with direct contact aspiration with complete recanalization after one pass. Slow flow noted in distal M4 branch (TICI 2C). Post procedural flat panel CT showed basal ganglia and anterior temporal contrast staining. No parenchymal or subarachnoid hemorrhage.  *** First pass delay: anatomically challenging due to occlusion at the origin of the vessel with no stump opacification with difficult access.  PLAN: - transfer to ICU - bed rest x 12 hours - SBP 120-160 mmHg

## 2022-08-11 NOTE — Sedation Documentation (Signed)
H+H DRAWN

## 2022-08-11 NOTE — Procedures (Signed)
INTERVENTIONAL NEURORADIOLOGY BRIEF POSTPROCEDURE NOTE  DIAGNOSTIC CEREBRAL ANGIOGRAM AND MECHANICAL THROMBECTOMY  Attending: Dr. Baldemar Lenis  Diagnosis: Left M3 occlusion.  Access site: RCFA  Access closure: 57F angioseal + manual pressure  Anesthesia: GETA  Medication used: Ancef 3 g IV.  Complications: Groin hematoma.  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 Mechanical thrombectomy performed with direct contact aspiration with complete recanalization after one pass. Slow flow noted in distal M4 branch (TICI 2C). Post procedural flat panel CT showed basal ganglia and anterior temporal contrast staining. No parenchymal or subarachnoid hemorrhage.  Immediately after extubation patient became very agitated and combative. Hematoma was noted in the thigh and groin. Pressure held. Patient was reintubated. [name] Arterial line placed. Hematoma stable and marked.  ++ First pass delay: anatomically challenging due to occlusion at the origin of the vessel with no stump opacification with difficult access.  PLAN: - transfer to ICU - bed rest x 12 hours - SBP 120-160 mmHg - H&H in 2 hours - Ultrasound for eval of femoral site in 3 hours

## 2022-08-11 NOTE — Sedation Documentation (Addendum)
PATIENT MOVING ALL EXTREMITIES,Patient reintubated after attempting to sit upon xray table after extubation, hematoma noted right femoral site, DrRodriguez in to hold pressure x another 

## 2022-08-11 NOTE — Code Documentation (Addendum)
Stroke Response Nurse Documentation Code Documentation  [name] [name] is a 72 y.o. female arriving to [facility]  via [facility] on 6/26 with past medical hx of DM,HTN, CVA, HLD . On clopidogrel 75 mg daily. Code stroke was activated by EMS.   Patient from home where she was LKW at 1300 and now complaining of word difficulty and slurred speech.  Stroke team at the bedside on patient arrival. Labs drawn and patient cleared for CT by Dr. Posey Rea. Patient to CT with team. NIHSS 3, see documentation for details and code stroke times. Patient with right hemianopia and Expressive aphasia  on exam. The following imaging was completed:  CT Head, CTA, and CTP. Patient is not a candidate for IV Thrombolytic due to Outside of window. Patient is a candidate for IR due to Left M3 occlusion. Multiple attempts to reach family over the phone. No family present at hospital. 2 physician consent obtained by Dr Otelia Limes and Dr. Posey Rea.   Care Plan: Emergent Thrombectomy.   Bedside handoff with IR RN Tabitha.    Rose Fillers  Rapid Response RN

## 2022-08-11 NOTE — H&P (Signed)
NEUROHOSPITALIST ADMISSION HISTORY AND PHYSICAL   Requesting physician: Dr. Posey Rea  Reason for Consult: Acute onset of garbled speech  History obtained from:  EMS and Chart     HPI:                                                                                                                                          [name] [name] is an 72 y.o. female with a PMHx of arthritis, right basal ganglia stroke, DM2, HLD, HTN, obesity and OSA who presents via EMS as a Code Stroke after acute onset of garbled speech at home. LKN was 1:00 PM when family last saw her. When family saw her again, she had abnormal speech with a word-salad quality. EMS was called, and they noted same. Code stroke was called in the field.   Home medications include Plavix. She is not on a blood thinner.   Past Medical History:  Diagnosis Date   Arthritis    "right knee" (07/02/2015)   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   Diabetes (HCC)    type II   Hyperlipidemia    Hypertension    Obesity    OSA (obstructive sleep apnea)    Overactive bladder    S/P right knee arthroscopy    with septic joint growing out MSSA   Septic arthritis of knee, right (HCC)    Wears glasses     Past Surgical History:  Procedure Laterality Date   BIOPSY  08/05/2020   Procedure: BIOPSY;  Surgeon: Kathi Der, MD;  Location: [facility];  Service: Gastroenterology;;   COLONOSCOPY     COLONOSCOPY WITH PROPOFOL N/A 08/05/2020   Procedure: COLONOSCOPY WITH PROPOFOL;  Surgeon: Kathi Der, MD;  Location: [facility];  Service: Gastroenterology;  Laterality: N/A;   DIAGNOSTIC LAPAROSCOPY     JOINT REPLACEMENT     KNEE ARTHROSCOPY Right 12/09/2012   Procedure: ARTHROSCOPIC IRRIGATION AND DEBRIDEMENT RIGHT KNEE;  Surgeon: Budd Palmer, MD;  Location: [facility] OR;  Service: Orthopedics;  Laterality: Right;   LEFT HEART CATH AND CORONARY ANGIOGRAPHY N/A 09/02/2016   Procedure: Left Heart  Cath and Coronary Angiography;  Surgeon: Rinaldo Cloud, MD;  Location: [facility] INVASIVE CV LAB;  Service: Cardiovascular;  Laterality: N/A;   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/06/2014   Procedure: PLACEMENT OF CEMENT SPACER;  Surgeon: Loreta Ave, MD;  Location: [facility] OR;  Service: Orthopedics;  Laterality: Right;   TOTAL KNEE ARTHROPLASTY Bilateral 2011-2012   left-right   TOTAL KNEE REVISION Right 11/06/2014   Procedure: REMOVAL OF TOTAL COMPONENTS, EXTENSIVE  IRRIGATION AND DEBRIDEMENT  RIGHT KNEE;  Surgeon: Loreta Ave, MD;  Location: [facility] OR;  Service: Orthopedics;  Laterality: Right;   TOTAL KNEE REVISION Right 07/02/2015  TOTAL KNEE REVISION Right 07/02/2015   Procedure: TOTAL KNEE REVISION;  Surgeon: Loreta Ave, MD;  Location: [facility] OR;  Service: Orthopedics;  Laterality: Right;   TUBAL LIGATION  ~ 1980   tummy tuck  1980s    Family History  Problem Relation Age of Onset   Breast cancer Mother    Stroke Father    Hypertension Other               Social History:  reports that she quit smoking about 30 years ago. Her smoking use included cigarettes. She has a 6.00 pack-year smoking history. She has never used smokeless tobacco. She reports that she does not drink alcohol and does not use drugs.  Allergies  Allergen Reactions   Metoprolol Tartrate Other (See Comments)    Other reaction(s): kidney pain    HOME MEDICATIONS:                                                                                                                      No current facility-administered medications on file prior to encounter.   Current Outpatient Medications on File Prior to Encounter  Medication Sig Dispense Refill   albuterol (VENTOLIN HFA) 108 (90 Base) MCG/ACT inhaler Inhale 2 puffs into the lungs every 4 (four) hours as needed for wheezing or shortness of breath.     carvedilol (COREG) 25 MG tablet Take 50 mg by mouth 2 (two) times daily.     clopidogrel (PLAVIX) 75 MG tablet  Take 1 tablet (75 mg total) by mouth daily. 30 tablet 0   cyclobenzaprine (FLEXERIL) 10 MG tablet Take 10 mg by mouth 2 (two) times daily as needed for muscle spasms.     diclofenac (VOLTAREN) 75 MG EC tablet Take 75 mg by mouth daily as needed for mild pain or moderate pain.     estradiol (ESTRACE) 0.1 MG/GM vaginal cream Place 1 Applicatorful vaginally daily as needed (vaginal burning).     furosemide (LASIX) 20 MG tablet Take 1 tablet (20 mg total) by mouth daily. 10 tablet 0   gabapentin (NEURONTIN) 100 MG capsule Take 1 capsule (100 mg total) by mouth 3 (three) times daily. 60 capsule 0   lidocaine (XYLOCAINE) 2 % solution Use as directed 15 mLs in the mouth or throat every 3 (three) hours as needed for mouth pain (Sore throat). 300 mL 0   minoxidil (LONITEN) 2.5 MG tablet Take 5 mg by mouth daily.     mupirocin cream (BACTROBAN) 2 % Apply 1 application topically daily as needed (rash under breast).     nystatin (MYCOSTATIN/NYSTOP) powder Apply 1 application topically 3 (three) times daily as needed for rash.     pantoprazole (PROTONIX) 40 MG tablet Take 40 mg by mouth daily as needed (heartburn).     solifenacin (VESICARE) 10 MG tablet Take 10 mg by mouth at bedtime.     spironolactone (ALDACTONE) 25 MG tablet Take 25 mg by mouth daily.     triamcinolone lotion (  KENALOG) 0.1 % Apply 1 application topically daily as needed (Reash under breast).     valsartan (DIOVAN) 320 MG tablet Take 320 mg by mouth at bedtime.       ROS:                                                                                                                                       Unable to obtain a reliable ROS due to aphasia. She does not appear to be in any pain.    Weight 130.2 kg. BP (!) 97/56 (BP Location: Left Arm)   Pulse 74   Temp (!) 97.5 F (36.4 C) (Oral)   Resp 13   Ht 5\' 4"  (1.626 m)   Wt 130.2 kg   SpO2 97%   BMI 49.27 kg/m     General Examination:                                                                                                        Physical Exam  General: Morbidly obese HEENT-  [place]/AT    Lungs- Respirations unlabored Extremities- No edema  Neurological Examination Mental Status: Awake and alert. Speech is intermittently with a fluent but garbled, word-salad quality that is devoid of information content. At times she can give short 2-5 word answers that are of normal content and fluency. Moderate to severe naming deficit. No dysarthria. Repetition impaired. Comprehension impaired to multiple commands, but was able to follow some basic commands. Frequent paraphasic errors.  Cranial Nerves: II: Right homonymous hemianopsia. PERRL  III,IV, VI: No ptosis. EOMI.  V: Temp sensation decreased on the right  VII: Smile symmetric VIII: Hearing intact to voice IX,X: No hoarseness XI: Symmetric shoulder shrug XII: Midline tongue extension Motor: BUE 5/5 proximally and distally BLE 5/5 proximally and distally  No pronator drift.  Sensory: Light touch intact throughout, bilaterally. No extinction to DSS.  Deep Tendon Reflexes: 2+ bilateral brachioradialis and biceps. Unable to elicit patellar or achilles reflexes. Toes downgoing bilaterally.  Cerebellar: No ataxia with FNF or H-S bilaterally  Gait: Deferred  NIHSS: 4   Lab Results: Basic Metabolic Panel: Recent Labs  Lab 08/11/22 2004  NA 141  K 4.0  CL 105  GLUCOSE 104*  BUN 11  CREATININE 1.40*    CBC: Recent Labs  Lab 08/11/22 2000 08/11/22 2004  WBC 4.6  --   NEUTROABS 2.7  --   HGB 14.3 16.0*  HCT 46.1* 47.0*  MCV 82.6  --  PLT 208  --     Cardiac Enzymes: No results for input(s): "CKTOTAL", "CKMB", "CKMBINDEX", "TROPONINI" in the last 168 hours.  Lipid Panel: No results for input(s): "CHOL", "TRIG", "HDL", "CHOLHDL", "VLDL", "LDLCALC" in the last 168 hours.  Imaging: No results found.   Assessment: 72 year old female presenting as a Code Stroke after acute onset of garbled  speech - Exam reveals mixed expressive and receptive aphasia in conjunction with right homonymous hemianopsia.  - CT head: Negative for acute abnormality. ASPECTS 10. - CTA of head and neck with CTP: Acute left M3 stump occlusion, inferior division. Negative CT perfusion for acute core infarct. 23 mL perfusion deficit at the posterior left MCA distribution, in keeping with the above described left M3 occlusion. Bulky calcified plaque about the carotid bifurcations/proximal ICAs with associated stenoses of up to 75% on the left and 50% on the right. Occlusion of the right vertebral artery at its origin. Attenuated distal reconstitution at the right V2 segment via collaterals. Atheromatous change about the dominant left V4 segment with associated moderate stenoses at its origin and about the left V3 segment. Atheromatous change about the carotid siphons with associated moderate multifocal narrowing, left worse than right. Short-segment moderate left P2 stenosis. Aortic Atherosclerosis  - The patient is not a TNK candidate due to time criteria - The patient is a VIR candidate on an emergent basis. Dr. Posey Rea, Dr. Nyra Jabs and myself have discussed extensively the risks/benefits of the procedure. With VIR, she has an approximately 50% chance of significant clinical neurological improvement, but with an associated approximate 10% chance of subarachnoid hemorrhage with associated possibility of significant worsening including death. The patient was not able to provide informed consent due to her aphasia. Family was not available to obtain consent despite multiple attempts to reach by telephone. The patient fits criteria for emergent two-physician decision-making regarding emergency treatment for her left M3 occlusion due to a high risk for severe long-term disability in the setting of her receptive and expressive aphasia.    - Stroke risk factors: Prior stroke, DM2, HLD, HTN, morbid obesity and OSA -  DDx for underlying etiology includes cardioembolic, atherothrombotic with artery-to-artery embolization, and in-situ thrombosis within the left M3 segment.    Recommendations: - Admitting to Neuro ICU.  - Post-VIR order set to include frequent neuro checks and BP management.  - No antiplatelet medications or anticoagulants for at least 24 hours following VIR, unless indicated due to stent placement.  - DVT prophylaxis with SCDs.  - Will need to be started on a statin.  - Will need escalation of antiplatelet therapy if follow up CT at 24 hours is negative for hemorrhagic conversion. - Carotid ultrasound to further evaluate bilateral ICA atherosclerotic narrowing. May be candidate for CEA.  - TTE.  - MRI brain  - PT/OT/Speech.  - NPO until passes swallow evaluation.  - Sliding scale insulin.  - Telemetry monitoring - Fasting lipid panel, HgbA1c - Telemetry monitoring. - Full Code based on past code status history. Patient is unable to make informed decisions at this time. No family available.   60 minutes spent in the emergent neurological evaluation and management of this critically ill patient.   Electronically signed: Dr. Caryl Pina 08/11/2022, 8:18 PM\

## 2022-08-11 NOTE — ED Notes (Signed)
Patient transported to CT 

## 2022-08-11 NOTE — Anesthesia Procedure Notes (Signed)
Procedure Name: Intubation Date/Time: 08/11/2022 9:31 PM  Performed by: Lelon Perla, CRNAPre-anesthesia Checklist: Patient identified, Emergency Drugs available, Suction available and Patient being monitored Patient Re-evaluated:Patient Re-evaluated prior to induction Oxygen Delivery Method: Circle system utilized Preoxygenation: Pre-oxygenation with 100% oxygen Induction Type: IV induction, Cricoid Pressure applied and Rapid sequence Laryngoscope Size: Mac and 4 Grade View: Grade II Tube type: Oral Tube size: 7.5 mm Number of attempts: 1 Airway Equipment and Method: Stylet and Oral airway Placement Confirmation: ETT inserted through vocal cords under direct vision, positive ETCO2 and breath sounds checked- equal and bilateral Secured at: 21 cm Tube secured with: Tape Dental Injury: Teeth and Oropharynx as per pre-operative assessment

## 2022-08-11 NOTE — Sedation Documentation (Addendum)
Angioseal to right femoral artery,pressure held X20 minutes

## 2022-08-11 NOTE — ED Triage Notes (Signed)
Patient BIB EMS from home due to confusion, weakness, and word salad. Patient LKW at 1300 today. Patient A&O at bridge with no word salad. Word salad while in CT room. Patient initially had headache with EMS, upon arrival denies headache.

## 2022-08-11 NOTE — Anesthesia Preprocedure Evaluation (Addendum)
Anesthesia Evaluation  Patient identified by MRN, date of birth, ID band Patient awake    Reviewed: Allergy & Precautions, NPO status , Patient's Chart, lab work & pertinent test results  Airway Mallampati: II  TM Distance: >3 FB Neck ROM: Full    Dental  (+) Dental Advisory Given   Pulmonary sleep apnea , former smoker   Pulmonary exam normal breath sounds clear to auscultation       Cardiovascular hypertension, Pt. on medications and Pt. on home beta blockers Normal cardiovascular exam Rhythm:Regular Rate:Normal  ECHO 04/22/2020: EF 60 to 65% RV normal No significant valve abnormality   Echo 2019 Left ventricle:  The cavity size was normal. There was mild  concentric hypertrophy. Systolic function was normal. The estimated  ejection fraction was in the range of 55% to 60%. Wall motion was  normal; there were no regional wall motion abnormalities.   -------------------------------------------------------------------  Aortic valve:   Structurally normal valve.   Cusp separation was  normal.  Doppler:  Transvalvular velocity was within the normal  range. There was no stenosis. There was no regurgitation.   -------------------------------------------------------------------  Mitral valve:   Structurally normal valve.   Leaflet separation was  normal.  Doppler:  Transvalvular velocity was within the normal  range. There was no evidence for stenosis. There was trivial  regurgitation.    Peak gradient (D): 3 mm Hg.   -------------------------------------------------------------------  Left atrium:  The atrium was normal in size.   -------------------------------------------------------------------  Atrial septum:  No defect or patent foramen ovale was identified.    -------------------------------------------------------------------  Pulmonic valve:   Poorly visualized.    -------------------------------------------------------------------  Tricuspid valve:   Structurally normal valve.   Leaflet separation  was normal.  Doppler:  Transvalvular velocity was within the normal  range. There was trivial regurgitation.   -------------------------------------------------------------------  Right atrium:  Poorly visualized.   -------------------------------------------------------------------  Pericardium: There was no pericardial effusion.   -------------------------------------------------------------------  Systemic veins:  Inferior vena cava: The vessel was normal in size. The  respirophasic diameter changes were in the normal range (>= 50%),  consistent with normal central venous pressure.     Neuro/Psych CVA, No Residual Symptoms  negative psych ROS   GI/Hepatic Neg liver ROS,GERD  Medicated,,Screening    Endo/Other  diabetes, Type 2  Morbid obesity  Renal/GU negative Renal ROS  negative genitourinary   Musculoskeletal  (+) Arthritis ,    Abdominal  (+) + obese  Peds  Hematology negative hematology ROS (+)   Anesthesia Other Findings   Reproductive/Obstetrics                             Anesthesia Physical Anesthesia Plan  ASA: 3 and emergent  Anesthesia Plan: General   Post-op Pain Management: Minimal or no pain anticipated   Induction: Intravenous, Rapid sequence and Cricoid pressure planned  PONV Risk Score and Plan: 2 and Ondansetron, Dexamethasone and Treatment may vary due to age or medical condition  Airway Management Planned: Oral ETT  Additional Equipment: Arterial line  Intra-op Plan:   Post-operative Plan: Possible Post-op intubation/ventilation  Informed Consent: I have reviewed the patients History and Physical, chart, labs and discussed the procedure including the risks, benefits and alternatives for the proposed anesthesia with the patient or authorized representative who has  indicated his/her understanding and acceptance.     Dental advisory given  Plan Discussed with: CRNA  Anesthesia Plan Comments:  Anesthesia Quick Evaluation  

## 2022-08-12 ENCOUNTER — Inpatient Hospital Stay (HOSPITAL_COMMUNITY): Payer: 59

## 2022-08-12 ENCOUNTER — Encounter (HOSPITAL_COMMUNITY): Payer: Self-pay | Admitting: Interventional Radiology

## 2022-08-12 DIAGNOSIS — Z978 Presence of other specified devices: Secondary | ICD-10-CM

## 2022-08-12 DIAGNOSIS — N179 Acute kidney failure, unspecified: Secondary | ICD-10-CM

## 2022-08-12 DIAGNOSIS — S301XXA Contusion of abdominal wall, initial encounter: Secondary | ICD-10-CM | POA: Diagnosis not present

## 2022-08-12 DIAGNOSIS — I6389 Other cerebral infarction: Secondary | ICD-10-CM | POA: Diagnosis not present

## 2022-08-12 DIAGNOSIS — I639 Cerebral infarction, unspecified: Secondary | ICD-10-CM | POA: Diagnosis not present

## 2022-08-12 DIAGNOSIS — I63232 Cerebral infarction due to unspecified occlusion or stenosis of left carotid arteries: Secondary | ICD-10-CM | POA: Diagnosis not present

## 2022-08-12 DIAGNOSIS — I724 Aneurysm of artery of lower extremity: Secondary | ICD-10-CM

## 2022-08-12 HISTORY — PX: IR US GUIDE VASC ACCESS RIGHT: IMG2390

## 2022-08-12 HISTORY — PX: IR PERCUTANEOUS ART THROMBECTOMY/INFUSION INTRACRANIAL INC DIAG ANGIO: IMG6087

## 2022-08-12 HISTORY — PX: IR CT HEAD LTD: IMG2386

## 2022-08-12 LAB — URINALYSIS, W/ REFLEX TO CULTURE (INFECTION SUSPECTED)
Bilirubin Urine: NEGATIVE
Glucose, UA: NEGATIVE mg/dL
Hgb urine dipstick: NEGATIVE
Ketones, ur: NEGATIVE mg/dL
Nitrite: NEGATIVE
Protein, ur: 30 mg/dL — AB
Specific Gravity, Urine: >1.046 — ABNORMAL HIGH (ref 1.005–1.030)
WBC, UA: >50 WBC/hpf (ref 0–5)
pH: 5 (ref 5.0–8.0)

## 2022-08-12 LAB — CBC
HCT: 38 % (ref 36.0–46.0)
Hemoglobin: 12 g/dL (ref 12.0–15.0)
MCH: 25.9 pg — ABNORMAL LOW (ref 26.0–34.0)
MCHC: 31.6 g/dL (ref 30.0–36.0)
MCV: 82.1 fL (ref 80.0–100.0)
Platelets: 244 10*3/uL (ref 150–400)
RBC: 4.63 MIL/uL (ref 3.87–5.11)
RDW: 16.2 % — ABNORMAL HIGH (ref 11.5–15.5)
WBC: 8.6 10*3/uL (ref 4.0–10.5)
nRBC: 0 % (ref 0.0–0.2)

## 2022-08-12 LAB — RAPID URINE DRUG SCREEN, HOSP PERFORMED
Amphetamines: NOT DETECTED
Barbiturates: NOT DETECTED
Benzodiazepines: NOT DETECTED
Cocaine: NOT DETECTED
Opiates: NOT DETECTED
Tetrahydrocannabinol: NOT DETECTED

## 2022-08-12 LAB — BASIC METABOLIC PANEL
Anion gap: 8 (ref 5–15)
BUN: 9 mg/dL (ref 8–23)
CO2: 21 mmol/L — ABNORMAL LOW (ref 22–32)
Calcium: 8.6 mg/dL — ABNORMAL LOW (ref 8.9–10.3)
Chloride: 109 mmol/L (ref 98–111)
Creatinine, Ser: 0.93 mg/dL (ref 0.44–1.00)
GFR, Estimated: >60 mL/min (ref >60)
Glucose, Bld: 148 mg/dL — ABNORMAL HIGH (ref 70–99)
Potassium: 3.7 mmol/L (ref 3.5–5.1)
Sodium: 138 mmol/L (ref 135–145)

## 2022-08-12 LAB — POCT I-STAT 7, (LYTES, BLD GAS, ICA,H+H)
Acid-base deficit: 5 mmol/L — ABNORMAL HIGH (ref 0.0–2.0)
Bicarbonate: 20.9 mmol/L (ref 20.0–28.0)
Calcium, Ion: 1.23 mmol/L (ref 1.15–1.40)
HCT: 34 % — ABNORMAL LOW (ref 36.0–46.0)
Hemoglobin: 11.6 g/dL — ABNORMAL LOW (ref 12.0–15.0)
O2 Saturation: 98 %
Patient temperature: 94
Potassium: 3.5 mmol/L (ref 3.5–5.1)
Sodium: 141 mmol/L (ref 135–145)
TCO2: 22 mmol/L (ref 22–32)
pCO2 arterial: 35 mmHg (ref 32–48)
pH, Arterial: 7.372 (ref 7.35–7.45)
pO2, Arterial: 100 mmHg (ref 83–108)

## 2022-08-12 LAB — GLUCOSE, CAPILLARY
Glucose-Capillary: 156 mg/dL — ABNORMAL HIGH (ref 70–99)
Glucose-Capillary: 124 mg/dL — ABNORMAL HIGH (ref 70–99)
Glucose-Capillary: 137 mg/dL — ABNORMAL HIGH (ref 70–99)
Glucose-Capillary: 112 mg/dL — ABNORMAL HIGH (ref 70–99)
Glucose-Capillary: 126 mg/dL — ABNORMAL HIGH (ref 70–99)
Glucose-Capillary: 126 mg/dL — ABNORMAL HIGH (ref 70–99)
Glucose-Capillary: 81 mg/dL (ref 70–99)

## 2022-08-12 LAB — MRSA NEXT GEN BY PCR, NASAL: MRSA by PCR Next Gen: NOT DETECTED

## 2022-08-12 LAB — ECHOCARDIOGRAM COMPLETE
Area-P 1/2: 2.73 cm2
Height: 64 in
S' Lateral: 2.5 cm
Weight: 4652.59 oz

## 2022-08-12 LAB — PHOSPHORUS: Phosphorus: 2.1 mg/dL — ABNORMAL LOW (ref 2.5–4.6)

## 2022-08-12 LAB — LIPID PANEL
Cholesterol: 133 mg/dL (ref 0–200)
HDL: 34 mg/dL — ABNORMAL LOW (ref >40)
LDL Cholesterol: 82 mg/dL (ref 0–99)
Total CHOL/HDL Ratio: 3.9 RATIO
Triglycerides: 87 mg/dL (ref <150)
VLDL: 17 mg/dL (ref 0–40)

## 2022-08-12 LAB — HEMOGLOBIN AND HEMATOCRIT, BLOOD
HCT: 39.7 % (ref 36.0–46.0)
HCT: 37.6 % (ref 36.0–46.0)
Hemoglobin: 12.1 g/dL (ref 12.0–15.0)
Hemoglobin: 12 g/dL (ref 12.0–15.0)

## 2022-08-12 LAB — MAGNESIUM: Magnesium: 1.8 mg/dL (ref 1.7–2.4)

## 2022-08-12 MED ORDER — ORAL CARE MOUTH RINSE: 15.0000 mL | OROMUCOSAL | Status: DC | PRN

## 2022-08-12 MED ORDER — LACTATED RINGERS IV BOLUS
500.0000 mL | Freq: Once | INTRAVENOUS | Status: AC
  Administered 2022-08-12: 500 mL via INTRAVENOUS

## 2022-08-12 MED ORDER — PERFLUTREN LIPID MICROSPHERE
1.0000 mL | INTRAVENOUS | Status: AC | PRN
  Administered 2022-08-12: 3 mL via INTRAVENOUS

## 2022-08-12 MED ORDER — DOCUSATE SODIUM 50 MG/5ML PO LIQD
100.0000 mg | Freq: Two times a day (BID) | ORAL | Status: DC
  Administered 2022-08-12: 100 mg
  Filled 2022-08-12: qty 10

## 2022-08-12 MED ORDER — POTASSIUM PHOSPHATES 15 MMOLE/5ML IV SOLN
15.0000 mmol | Freq: Once | INTRAVENOUS | Status: AC
  Administered 2022-08-12: 15 mmol via INTRAVENOUS
  Filled 2022-08-12: qty 5

## 2022-08-12 MED ORDER — FAMOTIDINE 20 MG PO TABS
20.0000 mg | ORAL_TABLET | Freq: Two times a day (BID) | ORAL | Status: DC
  Administered 2022-08-12: 20 mg
  Filled 2022-08-12: qty 1

## 2022-08-12 MED ORDER — PHENYLEPHRINE HCL-NACL 20-0.9 MG/250ML-% IV SOLN
25.0000 ug/min | INTRAVENOUS | Status: DC
  Administered 2022-08-12: 75 ug/min via INTRAVENOUS
  Administered 2022-08-12: 90 ug/min via INTRAVENOUS
  Administered 2022-08-12: 120 ug/min via INTRAVENOUS
  Administered 2022-08-12: 25 ug/min via INTRAVENOUS
  Administered 2022-08-13: 30 ug/min via INTRAVENOUS
  Filled 2022-08-12 (×5): qty 250

## 2022-08-12 MED ORDER — ORAL CARE MOUTH RINSE
15.0000 mL | OROMUCOSAL | Status: DC
  Administered 2022-08-12 – 2022-08-13 (×4): 15 mL via OROMUCOSAL

## 2022-08-12 MED ORDER — CLOPIDOGREL BISULFATE 75 MG PO TABS
75.0000 mg | ORAL_TABLET | Freq: Every day | ORAL | Status: DC
  Administered 2022-08-12: 75 mg
  Filled 2022-08-12: qty 1

## 2022-08-12 MED ORDER — ALBUTEROL SULFATE (2.5 MG/3ML) 0.083% IN NEBU
2.5000 mg | INHALATION_SOLUTION | RESPIRATORY_TRACT | Status: DC | PRN
  Administered 2022-08-14: 2.5 mg via RESPIRATORY_TRACT
  Filled 2022-08-12: qty 3

## 2022-08-12 MED ORDER — FENTANYL CITRATE PF 50 MCG/ML IJ SOSY: 25.0000 ug | PREFILLED_SYRINGE | INTRAMUSCULAR | Status: DC | PRN

## 2022-08-12 MED ORDER — DEXMEDETOMIDINE HCL IN NACL 400 MCG/100ML IV SOLN
0.0000 ug/kg/h | INTRAVENOUS | Status: DC
  Administered 2022-08-12: 1 ug/kg/h via INTRAVENOUS
  Administered 2022-08-12: 0.4 ug/kg/h via INTRAVENOUS
  Filled 2022-08-12 (×2): qty 100

## 2022-08-12 MED ORDER — LACTATED RINGERS IV SOLN: INTRAVENOUS | Status: DC

## 2022-08-12 MED ORDER — SODIUM CHLORIDE 0.9 % IV SOLN
250.0000 mL | INTRAVENOUS | Status: DC
  Administered 2022-08-12: 250 mL via INTRAVENOUS

## 2022-08-12 MED ORDER — ROSUVASTATIN CALCIUM 20 MG PO TABS
20.0000 mg | ORAL_TABLET | Freq: Every day | ORAL | Status: DC
  Administered 2022-08-12: 20 mg
  Filled 2022-08-12: qty 1

## 2022-08-12 MED ORDER — FENTANYL CITRATE PF 50 MCG/ML IJ SOSY
25.0000 ug | PREFILLED_SYRINGE | INTRAMUSCULAR | Status: DC | PRN
  Administered 2022-08-12 (×2): 50 ug via INTRAVENOUS
  Filled 2022-08-12: qty 2
  Filled 2022-08-12 (×2): qty 1

## 2022-08-12 MED ORDER — ORAL CARE MOUTH RINSE
15.0000 mL | OROMUCOSAL | Status: DC
  Administered 2022-08-12 (×8): 15 mL via OROMUCOSAL

## 2022-08-12 MED ORDER — POLYETHYLENE GLYCOL 3350 17 G PO PACK
17.0000 g | PACK | Freq: Every day | ORAL | Status: DC
  Administered 2022-08-12: 17 g
  Filled 2022-08-12: qty 1

## 2022-08-12 MED ORDER — PROPOFOL 1000 MG/100ML IV EMUL
0.0000 ug/kg/min | INTRAVENOUS | Status: DC
  Administered 2022-08-12 (×3): 45 ug/kg/min via INTRAVENOUS
  Administered 2022-08-12: 50 ug/kg/min via INTRAVENOUS
  Filled 2022-08-12 (×4): qty 100

## 2022-08-12 MED ORDER — ASPIRIN 81 MG PO CHEW
81.0000 mg | CHEWABLE_TABLET | Freq: Every day | ORAL | Status: DC
  Administered 2022-08-12: 81 mg
  Filled 2022-08-12: qty 1

## 2022-08-12 NOTE — Transfer of Care (Signed)
Immediate Anesthesia Transfer of Care Note  Patient: [name] [name]  Procedure(s) Performed: IR WITH ANESTHESIA  Patient Location: NICU  Anesthesia Type:General  Level of Consciousness: Patient remains intubated per anesthesia plan  Airway & Oxygen Therapy: Patient placed on Ventilator (see vital sign flow sheet for setting)  Post-op Assessment: Report given to RN and Post -op Vital signs reviewed and stable  Post vital signs: Reviewed and stable  Last Vitals:  Vitals Value Taken Time  BP 91/63 08/12/22 0030  Temp    Pulse 78 08/12/22 0033  Resp 24 08/12/22 0033  SpO2 100 % 08/12/22 0033  Vitals shown include unvalidated device data.  Last Pain:  Vitals:   08/11/22 2035  TempSrc:   PainSc: 0-No pain         Complications: No notable events documented.

## 2022-08-12 NOTE — Progress Notes (Signed)
SLP Cancellation Note  Patient Details Name: [name] [name] MRN: [id number] DOB: [birth year]   Cancelled treatment:       Reason Eval/Treat Not Completed: Medical issues which prohibited therapy (Pt currently on the vent. SLP will follow up subsequently.)  Korbyn Chopin I. Vear Clock, MS, CCC-SLP Neuro Diagnostic Specialist  Acute Rehabilitation Services Office number: [phone]  Scheryl Marten 08/12/2022, 10:21 AM

## 2022-08-12 NOTE — Consult Note (Addendum)
NAME:  [name] [name], MRN:  [id number], DOB:  1950-12-05, LOS: 1 ADMISSION DATE:  08/11/2022, CONSULTATION DATE:  6/27 REFERRING MD:  Dr. Otelia Limes, CHIEF COMPLAINT:  Stroke   History of Present Illness:  Patient is encephalopathic and/or intubated; therefore, history has been obtained from chart review.  72 year old female with past medical history as below, which is significant for former stroke, diabetes mellitus, hypertension, hyperlipidemia, chronic pain.  She presented to [facility] emergency department on 6/26 via EMS with complaints of garbled speech.  Last known well 1 PM.  Code stroke was called upon arrival to the emergency department.  CT of the head was negative.  She was outside the window for systemic thrombolytics.  CT angiogram demonstrated a left M3 occlusion.  Based on the CT perfusion study she was deemed to be candidate for VIR.  She underwent successful mechanical thrombectomy with TICI 2c result.  Postoperatively she was extubated but became quite agitated ultimately requiring reintubation.  She was transferred to the ICU where PCCM was asked to assist with her care.   Pertinent  Medical History   has a past medical history of Arthritis, CVA (cerebral vascular accident) (HCC), Diabetes (HCC), Hyperlipidemia, Hypertension, Obesity, OSA (obstructive sleep apnea), Overactive bladder, S/P right knee arthroscopy, Septic arthritis of knee, right (HCC), and Wears glasses.   Significant Hospital Events: Including procedures, antibiotic start and stop dates in addition to other pertinent events     Interim History / Subjective:    Objective   Blood pressure (!) 97/56, pulse 75, temperature (!) 94 F (34.4 C), temperature source Axillary, resp. rate 16, height 5\' 4"  (1.626 m), weight 130.2 kg, SpO2 97 %.    Vent Mode: PRVC FiO2 (%):  [40 %] 40 % Set Rate:  [16 bmp] 16 bmp Vt Set:  [440 mL] 440 mL PEEP:  [5 cmH20] 5 cmH20 Plateau Pressure:  [20 cmH20] 20 cmH20    Intake/Output Summary (Last 24 hours) at 08/12/2022 0040 Last data filed at 08/11/2022 2259 Gross per 24 hour  Intake 1000 ml  Output --  Net 1000 ml   Filed Weights   08/11/22 2004 08/11/22 2035  Weight: 130.2 kg 130.2 kg    Examination: General: obese elderly female on vent HENT: [place]/AT, PERRL, no JVD Lungs: Clear bilateral breath sounds Cardiovascular: S1 S2, no MRG Abdomen: Soft, NT Extremities: Hematoma R groin extending to markings.  Neuro: Sedated, intermittently agitated and reaching for tube with R arm  GU: Foley  Resolved Hospital Problem list     Assessment & Plan:   CVA: Left M3 stump occlusion s/p thrombectomy 6/27 - Management per stroke service - Echo pending - Statin when taking PO - Repeat CT at 24 hours - MRI - Keep SBP 120-140 mmHg - Currently requiring phenylephrine   R groin hematoma - hemoglobin pending - Frequent assessments  ETT present: failed extubation due to agitation.  - Full vent support - Propofol and PRN fentanyl for RASS goal -1 to -2 - Daily SBT/SAT  AKI: - gentle hydration - repeat BMP in AM  DM - CBG monitoring and SSI  HTN - holding home medications for now    Best Practice (right click and "Reselect all SmartList Selections" daily)   Diet/type: NPO DVT prophylaxis: SCD GI prophylaxis: H2B Lines: Arterial Line Foley:  Yes, and it is still needed Code Status:  full code Last date of multidisciplinary goals of care discussion [unable to contact family]  Labs   CBC: Recent Labs  Lab  08/11/22 2000 08/11/22 2004  WBC 4.6  --   NEUTROABS 2.7  --   HGB 14.3 16.0*  HCT 46.1* 47.0*  MCV 82.6  --   PLT 208  --     Basic Metabolic Panel: Recent Labs  Lab 08/11/22 2000 08/11/22 2004  NA 139 141  K 4.0 4.0  CL 105 105  CO2 26  --   GLUCOSE 107* 104*  BUN 10 11  CREATININE 1.39* 1.40*  CALCIUM 9.7  --    GFR: Estimated Creatinine Clearance: 49.4 mL/min (A) (by C-G formula based on SCr of 1.4 mg/dL  (H)). Recent Labs  Lab 08/11/22 2000  WBC 4.6    Liver Function Tests: Recent Labs  Lab 08/11/22 2000  AST 14*  ALT 9  ALKPHOS 52  BILITOT 1.0  PROT 7.0  ALBUMIN 3.8   No results for input(s): "LIPASE", "AMYLASE" in the last 168 hours. No results for input(s): "AMMONIA" in the last 168 hours.  ABG    Component Value Date/Time   TCO2 25 08/11/2022 2004     Coagulation Profile: Recent Labs  Lab 08/11/22 2000  INR 1.1    Cardiac Enzymes: No results for input(s): "CKTOTAL", "CKMB", "CKMBINDEX", "TROPONINI" in the last 168 hours.  HbA1C: Hgb A1c MFr Bld  Date/Time Value Ref Range Status  03/03/2018 11:42 AM 6.0 (H) 4.8 - 5.6 % Final    Comment:             Prediabetes: 5.7 - 6.4          Diabetes: >6.4          Glycemic control for adults with diabetes: <7.0   10/07/2017 04:03 AM 6.2 (H) 4.8 - 5.6 % Final    Comment:    (NOTE) Pre diabetes:          5.7%-6.4% Diabetes:              >6.4% Glycemic control for   <7.0% adults with diabetes     CBG: Recent Labs  Lab 08/11/22 1958  GLUCAP 112*    Review of Systems:   Patient is encephalopathic and/or intubated; therefore, history has been obtained from chart review.    Past Medical History:  She,  has a past medical history of Arthritis, CVA (cerebral vascular accident) (HCC), Diabetes (HCC), Hyperlipidemia, Hypertension, Obesity, OSA (obstructive sleep apnea), Overactive bladder, S/P right knee arthroscopy, Septic arthritis of knee, right (HCC), and Wears glasses.   Surgical History:   Past Surgical History:  Procedure Laterality Date   BIOPSY  08/05/2020   Procedure: BIOPSY;  Surgeon: Kathi Der, MD;  Location: [facility] ENDOSCOPY;  Service: Gastroenterology;;   COLONOSCOPY     COLONOSCOPY WITH PROPOFOL N/A 08/05/2020   Procedure: COLONOSCOPY WITH PROPOFOL;  Surgeon: Kathi Der, MD;  Location: [facility] ENDOSCOPY;  Service: Gastroenterology;  Laterality: N/A;   DIAGNOSTIC LAPAROSCOPY     JOINT  REPLACEMENT     KNEE ARTHROSCOPY Right 12/09/2012   Procedure: ARTHROSCOPIC IRRIGATION AND DEBRIDEMENT RIGHT KNEE;  Surgeon: Budd Palmer, MD;  Location: [facility] OR;  Service: Orthopedics;  Laterality: Right;   LEFT HEART CATH AND CORONARY ANGIOGRAPHY N/A 09/02/2016   Procedure: Left Heart Cath and Coronary Angiography;  Surgeon: Rinaldo Cloud, MD;  Location: [facility] INVASIVE CV LAB;  Service: Cardiovascular;  Laterality: N/A;   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/06/2014   Procedure: PLACEMENT OF CEMENT SPACER;  Surgeon: Loreta Ave, MD;  Location: [facility] OR;  Service: Orthopedics;  Laterality: Right;  TOTAL KNEE ARTHROPLASTY Bilateral 2011-2012   left-right   TOTAL KNEE REVISION Right 11/06/2014   Procedure: REMOVAL OF TOTAL COMPONENTS, EXTENSIVE  IRRIGATION AND DEBRIDEMENT  RIGHT KNEE;  Surgeon: Loreta Ave, MD;  Location[facility] OR;  Service: Orthopedics;  Laterality: Right;   TOTAL KNEE REVISION Right 07/02/2015   TOTAL KNEE REVISION Right 07/02/2015   Procedure: TOTAL KNEE REVISION;  Surgeon: Loreta Ave, MD;  Location: [facility] OR;  Service: Orthopedics;  Laterality: Right;   TUBAL LIGATION  ~ 1980   tummy tuck  1980s     Social History:   reports that she quit smoking about 30 years ago. Her smoking use included cigarettes. She has a 6.00 pack-year smoking history. She has never used smokeless tobacco. She reports that she does not drink alcohol and does not use drugs.   Family History:  Her family history includes Breast cancer in her mother; Hypertension in an other family member; Stroke in her father.   Allergies Allergies  Allergen Reactions   Metoprolol Tartrate Other (See Comments)    Other reaction(s): kidney pain     Home Medications  Prior to Admission medications   Medication Sig Start Date End Date Taking? Authorizing Provider  albuterol (VENTOLIN HFA) 108 (90 Base) MCG/ACT inhaler Inhale 2 puffs into the lungs every 4 (four) hours as needed for wheezing or  shortness of breath. 07/25/20   [provider]  carvedilol (COREG) 25 MG tablet Take 50 mg by mouth 2 (two) times daily. 06/16/20   [provider]  clopidogrel (PLAVIX) 75 MG tablet Take 1 tablet (75 mg total) by mouth daily. 10/11/17   Albertine Grates, MD  cyclobenzaprine (FLEXERIL) 10 MG tablet Take 10 mg by mouth 2 (two) times daily as needed for muscle spasms. 07/16/20   [provider]  diclofenac (VOLTAREN) 75 MG EC tablet Take 75 mg by mouth daily as needed for mild pain or moderate pain.    [provider]  estradiol (ESTRACE) 0.1 MG/GM vaginal cream Place 1 Applicatorful vaginally daily as needed (vaginal burning). 06/30/20   [provider]  furosemide (LASIX) 20 MG tablet Take 1 tablet (20 mg total) by mouth daily. 10/25/20   Melene Plan, DO  gabapentin (NEURONTIN) 100 MG capsule Take 1 capsule (100 mg total) by mouth 3 (three) times daily. 06/19/22   Pricilla Loveless, MD  lidocaine (XYLOCAINE) 2 % solution Use as directed 15 mLs in the mouth or throat every 3 (three) hours as needed for mouth pain (Sore throat). 03/31/21   Theadora Rama Scales, PA-C  minoxidil (LONITEN) 2.5 MG tablet Take 5 mg by mouth daily. 06/23/20   [provider]  mupirocin cream (BACTROBAN) 2 % Apply 1 application topically daily as needed (rash under breast).    [provider]  nystatin (MYCOSTATIN/NYSTOP) powder Apply 1 application topically 3 (three) times daily as needed for rash. 06/11/20   [provider]  pantoprazole (PROTONIX) 40 MG tablet Take 40 mg by mouth daily as needed (heartburn).    [provider]  solifenacin (VESICARE) 10 MG tablet Take 10 mg by mouth at bedtime. 07/01/20   [provider]  spironolactone (ALDACTONE) 25 MG tablet Take 25 mg by mouth daily. 06/06/20   [provider]  triamcinolone lotion (KENALOG) 0.1 % Apply 1 application topically daily as needed (Reash under breast).    [provider]   valsartan (DIOVAN) 320 MG tablet Take 320 mg by mouth at bedtime. 06/24/20   [provider]     Critical care time: 37 minutes     Joneen Roach, AGACNP-BC [facility]  See Amion for personal pager PCCM on call pager [phone] until 7pm. Please call Elink 7p-7a. [phone]  08/12/2022 1:30 AM

## 2022-08-12 NOTE — Progress Notes (Signed)
08/12/2022 Seen and examined. Groin doesn't look too bad Vascular US benign Will get MRI then see if we can wean off vent on precedex given agitation issues  My cc time 20 mins

## 2022-08-12 NOTE — Progress Notes (Signed)
VASCULAR LAB    Ultrasound of right groin has been performed.  See CV proc for preliminary results.   Janyla Biscoe, RVT 08/12/2022, 8:27 AM

## 2022-08-12 NOTE — Progress Notes (Signed)
PT Cancellation Note  Patient Details Name: [name] [name] MRN: [id number] DOB: 06/25/1950   Cancelled Treatment:    Reason Eval/Treat Not Completed: Active bedrest order remains this morning that is not set to expire until 11:09AM today. PT will continue to follow but hold on initial evaluation until after bedrest expires or until otherwise directed by MD.   Vickki Muff, PT, DPT   Acute Rehabilitation Department Office [phone] Secure Chat Communication Preferred  [name] 08/12/2022, 7:54 AM

## 2022-08-12 NOTE — Progress Notes (Signed)
Pt transported from 4N26 to MRI and back to 4N26 with no complications.

## 2022-08-12 NOTE — TOC Initial Note (Signed)
Transition of Care [facility]) - Initial/Assessment Note    Patient Details  Name: [name] [name] MRN: [id number] Date of Birth: Jun 18, 1950  Transition of Care [facility]) CM/SW Contact:    Mearl Latin, LCSW Phone Number: 08/12/2022, 10:04 AM  Clinical Narrative:                 Patient admitted from home with stroke and is currently intubated. No current TOC needs identified but will continue to monitor.     Barriers to Discharge: Continued Medical Work up   Patient Goals and CMS Choice            Expected Discharge Plan and Services       Living arrangements for the past 2 months: Single Family Home                                      Prior Living Arrangements/Services Living arrangements for the past 2 months: Single Family Home Lives with:: Self, Adult Children Patient language and need for interpreter reviewed:: Yes        Need for Family Participation in Patient Care: Yes (Comment) Care giver support system in place?: Yes (comment)   Criminal Activity/Legal Involvement Pertinent to Current Situation/Hospitalization: No - Comment as needed  Activities of Daily Living   ADL Screening (condition at time of admission) Patient's cognitive ability adequate to safely complete daily activities?: No Is the patient deaf or have difficulty hearing?: No Does the patient have difficulty seeing, even when wearing glasses/contacts?: No Does the patient have difficulty concentrating, remembering, or making decisions?: Yes Patient able to express need for assistance with ADLs?: No Does the patient have difficulty dressing or bathing?: Yes Independently performs ADLs?: No Communication: Dependent Is this a change from baseline?: Change from baseline, expected to last >3 days Dressing (OT): Dependent Is this a change from baseline?: Change from baseline, expected to last >3 days Grooming: Dependent Is this a change from baseline?: Change from baseline, expected to last  >3 days Feeding: Dependent Is this a change from baseline?: Change from baseline, expected to last >3 days Bathing: Dependent Is this a change from baseline?: Change from baseline, expected to last >3 days Toileting: Dependent Is this a change from baseline?: Change from baseline, expected to last >3days In/Out Bed: Dependent Is this a change from baseline?: Change from baseline, expected to last >3 days Walks in Home: Dependent Is this a change from baseline?: Change from baseline, expected to last >3 days Does the patient have difficulty walking or climbing stairs?: No Weakness of Legs: Both Weakness of Arms/Hands: None  Permission Sought/Granted                  Emotional Assessment   Attitude/Demeanor/Rapport: Unable to Assess Affect (typically observed): Unable to Assess   Alcohol / Substance Use: Not Applicable Psych Involvement: No (comment)  Admission diagnosis:  Stroke (cerebrum) [facility]) [I63.9] Patient Active Problem List   Diagnosis Date Noted   Hematoma of groin 08/12/2022   Endotracheal tube present 08/12/2022   AKI (acute kidney injury) (HCC) 08/12/2022   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/11/2022   Stroke (HCC) 10/06/2017   Acute CVA (cerebrovascular accident) (HCC) 10/06/2017   Trigger thumb, right thumb 05/03/2016   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   Protein-calorie malnutrition (HCC) 11/15/2014   Osteomyelitis of right knee region (HCC) 11/08/2014   Complication of device 11/04/2014   Septic  arthritis (HCC) 11/04/2014   S/P right knee arthroscopy    UTI (urinary tract infection) 12/09/2012   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 [place] [address] Phone: [phone] Fax: [phone]  [id number] [place], [place] - [address] AT [facility] [address] [place]  [address] Phone: [phone] Fax: [phone]  [facility] #[id number] [place], [place] - [address] AT [facility] OF [facility] [address] [place] [address] Phone: [phone] Fax: [phone]     Social Determinants of Health (SDOH) Social History: SDOH Screenings   Tobacco Use: Medium Risk (08/12/2022)   SDOH Interventions:     Readmission Risk Interventions     No data to display

## 2022-08-12 NOTE — Progress Notes (Addendum)
PCCM Interval Note  69 yoF admitted overnight w/ Left M3 occlusion s/p successful NIR thrombectomy overnight complicated by agitation and R groin hematoma requiring reintubation.  H/H since stable with no evidence of pseudoaneurysm on vascular ultrasound and site remains soft/ reassuring. MRI completed.   Off pressors, changed over to precedex earlier in hopes of extubation this afternoon and tolerating PSV trials however pt now hypotensive off all sedation and minimally responsive to sternal rub.  Earlier was sitting up, moving extremities but not following commands.  UOP cyu since 7am.  TTE noted hyperdynamic function, EF 70-75%, G1DD, est RA pressure of 3. sCr 0.93 on am labs.  P:  - hold on extubation given recurrent hypotension and poor mental status - Neo for SBP goals > 120 for initial 24hrs s/p thrombectomy - change fluids to LR 75 ml/hr with LR 500 ml bolus now.  Consider repeat if ongoing vasopressor support required.   - prn precedex    Addendum: 1654> pt remained off precedex and tolerating PSV 5/5 for nearly 2hrs.  BP improved> getting fluid bolus and on low dose NEO at 15.  Pt now awake, mouthing words, therefore will continue now with extubation.      Posey Boyer, MSN, NP, AG-ACNP-BC [facility] 08/12/2022, 4:07 PM  See Amion for pager If no response to pager , please call [phone] until 7pm After 7:00 pm call Elink  [phone]

## 2022-08-12 NOTE — Anesthesia Postprocedure Evaluation (Signed)
Anesthesia Post Note  Patient: [name] [name]  Procedure(s) Performed: IR WITH ANESTHESIA     Patient location during evaluation: PACU Anesthesia Type: General Level of consciousness: sedated and patient remains intubated per anesthesia plan Pain management: pain level controlled Vital Signs Assessment: post-procedure vital signs reviewed and stable Respiratory status: patient remains intubated per anesthesia plan Cardiovascular status: stable Anesthetic complications: no Comments: Pt had to be reintubated due to being combative.   No notable events documented.  Last Vitals:  Vitals:   08/12/22 2045 08/12/22 2100  BP: (!) 113/50 (!) 145/57  Pulse: 65 66  Resp: 14   Temp:    SpO2: 100% 100%    Last Pain:  Vitals:   08/12/22 2000  TempSrc: Axillary  PainSc: 0-No pain                 Lewie Loron

## 2022-08-12 NOTE — Progress Notes (Signed)
*  PRELIMINARY RESULTS* Echocardiogram 2D Echocardiogram has been performed.  [name] [name] 08/12/2022, 11:51 AM

## 2022-08-12 NOTE — Progress Notes (Addendum)
STROKE TEAM PROGRESS NOTE   BRIEF HPI [name] is a 72 y.o. female with history of arthritis, right basal ganglia stroke, diabetes, hypertension, hyperlipidemia, obesity and sleep apnea presenting with acute onset aphasia with right visual field cut.  She presented outside the window for TNK.  She was found to have a left M3 occlusion with 23 mm perfusion deficit, no core and was taken to IR for mechanical thrombectomy.  This procedure was successful with TICI 2c flow achieved.  Patient was extubated postprocedure, but she then became very agitated and combative and developed a hematoma in the groin.  She was then reintubated.   SIGNIFICANT HOSPITAL EVENTS Mechanical thrombectomy 6/26  INTERM HISTORY/SUBJECTIVE Patient has been hypotensive overnight requiring phenylephrine.  She remains intubated and will hopefully be extubated tomorrow.   OBJECTIVE  CBC    Component Value Date/Time   WBC 8.6 08/12/2022 0647   RBC 4.63 08/12/2022 0647   HGB 12.0 08/12/2022 0647   HGB 12.5 03/03/2018 1142   HCT 38.0 08/12/2022 0647   HCT 40.6 03/03/2018 1142   PLT 244 08/12/2022 0647   PLT 297 03/03/2018 1142   MCV 82.1 08/12/2022 0647   MCV 74 (L) 03/03/2018 1142   MCH 25.9 (L) 08/12/2022 0647   MCHC 31.6 08/12/2022 0647   RDW 16.2 (H) 08/12/2022 0647   RDW 16.6 (H) 03/03/2018 1142   LYMPHSABS 1.3 08/11/2022 2000   MONOABS 0.5 08/11/2022 2000   EOSABS 0.0 08/11/2022 2000   BASOSABS 0.0 08/11/2022 2000    BMET    Component Value Date/Time   NA 138 08/12/2022 0647   NA 144 03/03/2018 1142   K 3.7 08/12/2022 0647   CL 109 08/12/2022 0647   CO2 21 (L) 08/12/2022 0647   GLUCOSE 148 (H) 08/12/2022 0647   BUN 9 08/12/2022 0647   BUN 10 03/03/2018 1142   CREATININE 0.93 08/12/2022 0647   CREATININE 0.71 01/01/2016 1617   CALCIUM 8.6 (L) 08/12/2022 0647   GFRNONAA >60 08/12/2022 0647    IMAGING past 24 hours ECHOCARDIOGRAM COMPLETE  Result Date: 08/12/2022    ECHOCARDIOGRAM  REPORT   Patient Name:   [name] [name] Date of Exam: 08/12/2022 Medical Rec #:  [id number]       Height:       64.0 in Accession #:    [id number]      Weight:       290.8 lb Date of Birth:  06-24-1950       BSA:          2.294 m Patient Age:    72 years        BP:           134/47 mmHg Patient Gender: F               HR:           84 bpm. Exam Location:  Inpatient Procedure: 2D Echo, Cardiac Doppler, Color Doppler and Intracardiac            Opacification Agent Indications:    Stroke I63.9  History:        Patient has prior history of Echocardiogram examinations, most                 recent 10/06/2017. Stroke; Risk Factors:Hypertension,                 Dyslipidemia and Former Smoker.  Sonographer:    Dondra Prader RVT RCS Referring Phys: [phone] ERIC LINDZEN  Sonographer Comments: Suboptimal apical window. 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 IMPRESSIONS  1. Left ventricular ejection fraction, by estimation, is 70 to 75%. The left ventricle has hyperdynamic function. The left ventricle has no regional wall motion abnormalities. There is moderate concentric left ventricular hypertrophy. Left ventricular diastolic parameters are consistent with Grade I diastolic dysfunction (impaired relaxation).  2. Right ventricular systolic function is normal. The right ventricular size is normal.  3. The mitral valve is grossly normal. Trivial mitral valve regurgitation. No evidence of mitral stenosis.  4. The aortic valve was not well visualized. Aortic valve regurgitation is not visualized.  5. The inferior vena cava is normal in size with greater than 50% respiratory variability, suggesting right atrial pressure of 3 mmHg. Comparison(s): No significant change from prior study. Conclusion(s)/Recommendation(s): No intracardiac source of embolism detected on this transthoracic study. Consider a transesophageal echocardiogram to exclude cardiac source of embolism if clinically indicated. FINDINGS  Left Ventricle:  Left ventricular ejection fraction, by estimation, is 70 to 75%. The left ventricle has hyperdynamic function. The left ventricle has no regional wall motion abnormalities. Definity contrast agent was given IV to delineate the left ventricular endocardial borders. The left ventricular internal cavity size was normal in size. There is moderate concentric left ventricular hypertrophy. Left ventricular diastolic parameters are consistent with Grade I diastolic dysfunction (impaired relaxation). Right Ventricle: The right ventricular size is normal. No increase in right ventricular wall thickness. Right ventricular systolic function is normal. Left Atrium: Left atrial size was normal in size. Right Atrium: Right atrial size was normal in size. Pericardium: There is no evidence of pericardial effusion. Mitral Valve: The mitral valve is grossly normal. Mild to moderate mitral annular calcification. Trivial mitral valve regurgitation. No evidence of mitral valve stenosis. Tricuspid Valve: The tricuspid valve is normal in structure. Tricuspid valve regurgitation is trivial. Aortic Valve: The aortic valve was not well visualized. Aortic valve regurgitation is not visualized. Pulmonic Valve: The pulmonic valve was normal in structure. Pulmonic valve regurgitation is trivial. Aorta: The aortic root is normal in size and structure. Venous: The inferior vena cava is normal in size with greater than 50% respiratory variability, suggesting right atrial pressure of 3 mmHg. IAS/Shunts: The atrial septum is grossly normal.  LEFT VENTRICLE           + PLAX 2D LVIDd:         4.05 cm LVIDs:         2.50 cm   Diastology LV PW:         1.25 cm   LV e' medial:    4.35 cm/s LV IVS:        1.40 cm   LV E/e' medial:  17.8 LVOT diam:     2.00 cm   LV e' lateral:   5.22 cm/s LV SV:         69        LV E/e' lateral: 14.9 LV SV Index:   30 LVOT Area:     3.14 cm  RIGHT VENTRICLE             IVC RV S prime:     13.80 cm/s  IVC diam: 1.60 cm  TAPSE (M-mode): 1.8 cm LEFT ATRIUM             Index        RIGHT ATRIUM           Index LA diam:  3.50 cm 1.53 cm/m   RA Area:     14.30 cm LA Vol (A2C):   32.9 ml 14.34 ml/m  RA Volume:   30.80 ml  13.43 ml/m LA Vol (A4C):   25.0 ml 10.90 ml/m LA Biplane Vol: 30.0 ml 13.08 ml/m  AORTIC VALVE LVOT Vmax:   106.00 cm/s LVOT Vmean:  66.900 cm/s LVOT VTI:    0.220 m  AORTA Ao Root diam: 2.80 cm Ao Asc diam:  2.20 cm MITRAL VALVE               TRICUSPID VALVE MV Area (PHT): 2.73 cm    TR Peak grad:   11.0 mmHg MV Decel Time: 278 msec    TR Vmax:        166.00 cm/s MV E velocity: 77.60 cm/s MV A velocity: 87.00 cm/s  SHUNTS MV E/A ratio:  0.89        Systemic VTI:  0.22 m                            Systemic Diam: 2.00 cm Laurance Flatten MD Electronically signed by Laurance Flatten MD Signature Date/Time: 08/12/2022/1:46:15 PM    Final    IR PERCUTANEOUS ART THROMBECTOMY/INFUSION INTRACRANIAL INC DIAG ANGIO  Result Date: 08/12/2022 INDICATION: Sicilia Killough is an 72 year old female presenting with acute onset of aphasia and right mid hemianopia; NIHSS 4. Her past medical history significant for arthritis, right basal ganglia stroke, DM2, HLD, HTN, morbid obesity and OSA; premorbid modified Rankin scale 0. Her last known well was 1:00 PM on 08/11/2022. Head CT showed no evidence of an acute infarct or hemorrhage. CT angiogram of the head and neck showed a left M3/MCA posterior division branch occlusion. She comes to our service for mechanical thrombectomy. EXAM: ULTRASOUND-GUIDED VASCULAR ACCESS DIAGNOSTIC CEREBRAL ANGIOGRAM MECHANICAL THROMBECTOMY FLAT PANEL HEAD CT ARTERIAL LINE PLACEMENT COMPARISON:  CT/CT angiogram of the head and neck 08/11/2022. MEDICATIONS: Ancef 3 g IV. ANESTHESIA/SEDATION: The procedure was performed under general anesthesia. CONTRAST:  64 mL of Omnipaque 300 milligram/mL FLUOROSCOPY: Radiation Exposure Index (as provided by the fluoroscopic device): 2,328 mGy Kerma  COMPLICATIONS: SIR Level A - No therapy, no consequence. TECHNIQUE: Informed written consent was waved due to altered mental status of patient with no healthcare proxy or next of kin available and emergent nature of procedure. Three physicians agreed that the procedure was necessary. Maximal Sterile Barrier Technique was utilized including caps, mask, sterile gowns, sterile gloves, sterile drape, hand hygiene and skin antiseptic. A timeout was performed prior to the initiation of the procedure. The right groin was prepped and draped in the usual sterile fashion. Using a micropuncture kit and the modified Seldinger technique, access was gained to the right common femoral artery and an 8 French sheath was placed. Real-time ultrasound guidance was utilized for vascular access including the acquisition of a permanent ultrasound image documenting patency of the accessed vessel. Under fluoroscopy, a Zoom 88 guide catheter was navigated over a 6 [place] Berenstein 2 catheter and a 0.035" Terumo Glidewire into the aortic arch. The catheter was placed into the left common carotid artery and then advanced into the left internal carotid artery. The diagnostic catheter was removed. Frontal, lateral angiograms and multiple magnified oblique angiograms of the head were obtained. FINDINGS: 1. Atherosclerotic changes of the right common femoral artery without hemodynamically significant stenosis. 2. Perfusion wedge defect in the left temporoparietal region. Suspected M3 occlusion although no opacification of stump  is seen. PROCEDURE: Using biplane roadmap guidance, a 38 Socrates aspiration catheter was navigated over a phenom 21 microcatheter and an Aristotle 14 microguidewire into the cavernous segment of the left ICA. The microcatheter was then navigated over the wire into the left M3/MCA posterior division branch. Multiple attempts necessary to access the occluded M3 branch. Then, the aspiration catheter was advanced over the  microcatheter into the occluded branch and connected to an aspiration pump. The microcatheter was removed. After approximately 1 minutes, the aspiration catheter was removed under constant aspiration. The guide catheter was aspirated for debris. Left internal carotid artery angiograms with frontal and lateral views of the head showed recanalization of the left M3 branch - TICI 2C. Slow flow is seen at the terminal cortical branches (M4). Flat panel CT of the head was obtained and post processed in a separate workstation with concurrent attending physician supervision. Selected images were sent to PACS. No evidence of hemorrhagic complication. Contrast staining of the basal ganglia and anterior left temporal lobe seen. Right common femoral artery angiogram was obtained in right anterior oblique view. The puncture is at the level of the common femoral artery. The artery has normal caliber, adequate for closure device. The sheath was exchanged over the wire for an 8 [place] Angio-Seal which was utilized for access closure. However, collagen plug herniated through the skin. Manual pressure was then held. Hemostasis was achieved. Patient was extubated. However, due to extreme agitation and combativeness, sedation and re-intubation was necessary. At this point, a groin hematoma was noted and systolic blood pressure dropped to 60 mmHg. Additional manual pressure was held at the access site for approximately 45 minutes. For better blood pressure monitoring, access was gained to the right radial artery using the modified Seldinger technique for arterial line placement. A 20 gauge catheter was utilized. Real-time ultrasound guidance was utilized for vascular access including the acquisition of a permanent ultrasound image documenting patency of the accessed vessel. Patient was then transferred to ICU in stable condition. IMPRESSION: Successful mechanical thrombectomy performed for treatment of a left M3/MCA occlusion achieving  complete recanalization after 1 pass with slow flow noted in the distal M4 branches (TICI 2 C). No hemorrhagic complication. PLAN: Transfer to ICU for continued post stroke care. Electronically Signed   By: Baldemar Lenis M.D.   On: 08/12/2022 10:42   IR US Guide Vasc Access Right  Result Date: 08/12/2022 INDICATION: Tiffancy Moger is an 72 year old female presenting with acute onset of aphasia and right mid hemianopia; NIHSS 4. Her past medical history significant for arthritis, right basal ganglia stroke, DM2, HLD, HTN, morbid obesity and OSA; premorbid modified Rankin scale 0. Her last known well was 1:00 PM on 08/11/2022. Head CT showed no evidence of an acute infarct or hemorrhage. CT angiogram of the head and neck showed a left M3/MCA posterior division branch occlusion. She comes to our service for mechanical thrombectomy. EXAM: ULTRASOUND-GUIDED VASCULAR ACCESS DIAGNOSTIC CEREBRAL ANGIOGRAM MECHANICAL THROMBECTOMY FLAT PANEL HEAD CT ARTERIAL LINE PLACEMENT COMPARISON:  CT/CT angiogram of the head and neck 08/11/2022. MEDICATIONS: Ancef 3 g IV. ANESTHESIA/SEDATION: The procedure was performed under general anesthesia. CONTRAST:  64 mL of Omnipaque 300 milligram/mL FLUOROSCOPY: Radiation Exposure Index (as provided by the fluoroscopic device): 2,328 mGy Kerma COMPLICATIONS: SIR Level A - No therapy, no consequence. TECHNIQUE: Informed written consent was waved due to altered mental status of patient with no healthcare proxy or next of kin available and emergent nature of procedure. Three physicians agreed that the  procedure was necessary. Maximal Sterile Barrier Technique was utilized including caps, mask, sterile gowns, sterile gloves, sterile drape, hand hygiene and skin antiseptic. A timeout was performed prior to the initiation of the procedure. The right groin was prepped and draped in the usual sterile fashion. Using a micropuncture kit and the modified Seldinger technique, access was  gained to the right common femoral artery and an 8 French sheath was placed. Real-time ultrasound guidance was utilized for vascular access including the acquisition of a permanent ultrasound image documenting patency of the accessed vessel. Under fluoroscopy, a Zoom 88 guide catheter was navigated over a 6 [place] Berenstein 2 catheter and a 0.035" Terumo Glidewire into the aortic arch. The catheter was placed into the left common carotid artery and then advanced into the left internal carotid artery. The diagnostic catheter was removed. Frontal, lateral angiograms and multiple magnified oblique angiograms of the head were obtained. FINDINGS: 1. Atherosclerotic changes of the right common femoral artery without hemodynamically significant stenosis. 2. Perfusion wedge defect in the left temporoparietal region. 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 PROCEDURE: Using biplane roadmap guidance, a 38 Socrates aspiration catheter was navigated over a phenom 21 microcatheter and an Aristotle 14 microguidewire into the cavernous segment of the left ICA. The microcatheter was then navigated over the wire into the left M3/MCA posterior division branch. Multiple attempts necessary to access the occluded M3 branch. Then, the aspiration catheter was advanced over the microcatheter into the occluded branch and connected to an aspiration pump. The microcatheter was removed. After approximately 1 minutes, the aspiration catheter was removed under constant aspiration. The guide catheter was aspirated for debris. Left internal carotid artery angiograms with frontal and lateral views of the head showed recanalization of the left M3 branch - TICI 2C. Slow flow is seen at the terminal cortical branches (M4). Flat panel CT of the head was obtained and post processed in a separate workstation with concurrent attending physician supervision. Selected images were sent to PACS. No evidence of hemorrhagic  complication. Contrast staining of the basal ganglia and anterior left temporal lobe seen. Right common femoral artery angiogram was obtained in right anterior oblique view. The puncture is at the level of the common femoral artery. The artery has normal caliber, adequate for closure device. The sheath was exchanged over the wire for an 8 [place] Angio-Seal which was utilized for access closure. However, collagen plug herniated through the skin. Manual pressure was then held. Hemostasis was achieved. Patient was extubated. However, due to extreme agitation and combativeness, sedation and re-intubation was necessary. At this point, a groin hematoma was noted and systolic blood pressure dropped to 60 mmHg. Additional manual pressure was held at the access site for approximately 45 minutes. For better blood pressure monitoring, access was gained to the right radial artery using the modified Seldinger technique for arterial line placement. A 20 gauge catheter was utilized. Real-time ultrasound guidance was utilized for vascular access including the acquisition of a permanent ultrasound image documenting patency of the accessed vessel. Patient was then transferred to ICU in stable condition. IMPRESSION: Successful mechanical thrombectomy performed for treatment of a left M3/MCA occlusion achieving complete recanalization after 1 pass with slow flow noted in the distal M4 branches (TICI 2 C). No hemorrhagic complication. PLAN: Transfer to ICU for continued post stroke care. Electronically Signed   By: Baldemar Lenis M.D.   On: 08/12/2022 10:42   IR CT Head Ltd  Result Date: 08/12/2022 INDICATION: Lendell Caprice  Plaut is an 72 year old female presenting with acute onset of aphasia and right mid hemianopia; NIHSS 4. Her past medical history significant for arthritis, right basal ganglia stroke, DM2, HLD, HTN, morbid obesity and OSA; premorbid modified Rankin scale 0. Her last known well was 1:00 PM on  08/11/2022. Head CT showed no evidence of an acute infarct or hemorrhage. CT angiogram of the head and neck showed a left M3/MCA posterior division branch occlusion. She comes to our service for mechanical thrombectomy. EXAM: ULTRASOUND-GUIDED VASCULAR ACCESS DIAGNOSTIC CEREBRAL ANGIOGRAM MECHANICAL THROMBECTOMY FLAT PANEL HEAD CT ARTERIAL LINE PLACEMENT COMPARISON:  CT/CT angiogram of the head and neck 08/11/2022. MEDICATIONS: Ancef 3 g IV. ANESTHESIA/SEDATION: The procedure was performed under general anesthesia. CONTRAST:  64 mL of Omnipaque 300 milligram/mL FLUOROSCOPY: Radiation Exposure Index (as provided by the fluoroscopic device): 2,328 mGy Kerma COMPLICATIONS: SIR Level A - No therapy, no consequence. TECHNIQUE: Informed written consent was waved due to altered mental status of patient with no healthcare proxy or next of kin available and emergent nature of procedure. Three physicians agreed that the procedure was necessary. Maximal Sterile Barrier Technique was utilized including caps, mask, sterile gowns, sterile gloves, sterile drape, hand hygiene and skin antiseptic. A timeout was performed prior to the initiation of the procedure. The right groin was prepped and draped in the usual sterile fashion. Using a micropuncture kit and the modified Seldinger technique, access was gained to the right common femoral artery and an 8 French sheath was placed. Real-time ultrasound guidance was utilized for vascular access including the acquisition of a permanent ultrasound image documenting patency of the accessed vessel. Under fluoroscopy, a Zoom 88 guide catheter was navigated over a 6 [place] Berenstein 2 catheter and a 0.035" Terumo Glidewire into the aortic arch. The catheter was placed into the left common carotid artery and then advanced into the left internal carotid artery. The diagnostic catheter was removed. Frontal, lateral angiograms and multiple magnified oblique angiograms of the head were obtained.  FINDINGS: 1. Atherosclerotic changes of the right common femoral artery without hemodynamically significant stenosis. 2. Perfusion wedge defect in the left temporoparietal region. 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 PROCEDURE: Using biplane roadmap guidance, a 38 Socrates aspiration catheter was navigated over a phenom 21 microcatheter and an Aristotle 14 microguidewire into the cavernous segment of the left ICA. The microcatheter was then navigated over the wire into the left M3/MCA posterior division branch. Multiple attempts necessary to access the occluded M3 branch. Then, the aspiration catheter was advanced over the microcatheter into the occluded branch and connected to an aspiration pump. The microcatheter was removed. After approximately 1 minutes, the aspiration catheter was removed under constant aspiration. The guide catheter was aspirated for debris. Left internal carotid artery angiograms with frontal and lateral views of the head showed recanalization of the left M3 branch - TICI 2C. Slow flow is seen at the terminal cortical branches (M4). Flat panel CT of the head was obtained and post processed in a separate workstation with concurrent attending physician supervision. Selected images were sent to PACS. No evidence of hemorrhagic complication. Contrast staining of the basal ganglia and anterior left temporal lobe seen. Right common femoral artery angiogram was obtained in right anterior oblique view. The puncture is at the level of the common femoral artery. The artery has normal caliber, adequate for closure device. The sheath was exchanged over the wire for an 8 [place] Angio-Seal which was utilized for access closure. However, collagen plug herniated  through the skin. Manual pressure was then held. Hemostasis was achieved. Patient was extubated. However, due to extreme agitation and combativeness, sedation and re-intubation was necessary. At this point, a groin  hematoma was noted and systolic blood pressure dropped to 60 mmHg. Additional manual pressure was held at the access site for approximately 45 minutes. For better blood pressure monitoring, access was gained to the right radial artery using the modified Seldinger technique for arterial line placement. A 20 gauge catheter was utilized. Real-time ultrasound guidance was utilized for vascular access including the acquisition of a permanent ultrasound image documenting patency of the accessed vessel. Patient was then transferred to ICU in stable condition. IMPRESSION: Successful mechanical thrombectomy performed for treatment of a left M3/MCA occlusion achieving complete recanalization after 1 pass with slow flow noted in the distal M4 branches (TICI 2 C). No hemorrhagic complication. PLAN: Transfer to ICU for continued post stroke care. Electronically Signed   By: Baldemar Lenis M.D.   On: 08/12/2022 10:42   IR US Guide Vasc Access Right  Result Date: 08/12/2022 INDICATION: Mckinnley Cottier is an 72 year old female presenting with acute onset of aphasia and right mid hemianopia; NIHSS 4. Her past medical history significant for arthritis, right basal ganglia stroke, DM2, HLD, HTN, morbid obesity and OSA; premorbid modified Rankin scale 0. Her last known well was 1:00 PM on 08/11/2022. Head CT showed no evidence of an acute infarct or hemorrhage. CT angiogram of the head and neck showed a left M3/MCA posterior division branch occlusion. She comes to our service for mechanical thrombectomy. EXAM: ULTRASOUND-GUIDED VASCULAR ACCESS DIAGNOSTIC CEREBRAL ANGIOGRAM MECHANICAL THROMBECTOMY FLAT PANEL HEAD CT ARTERIAL LINE PLACEMENT COMPARISON:  CT/CT angiogram of the head and neck 08/11/2022. MEDICATIONS: Ancef 3 g IV. ANESTHESIA/SEDATION: The procedure was performed under general anesthesia. CONTRAST:  64 mL of Omnipaque 300 milligram/mL FLUOROSCOPY: Radiation Exposure Index (as provided by the fluoroscopic  device): 2,328 mGy Kerma COMPLICATIONS: SIR Level A - No therapy, no consequence. TECHNIQUE: Informed written consent was waved due to altered mental status of patient with no healthcare proxy or next of kin available and emergent nature of procedure. Three physicians agreed that the procedure was necessary. Maximal Sterile Barrier Technique was utilized including caps, mask, sterile gowns, sterile gloves, sterile drape, hand hygiene and skin antiseptic. A timeout was performed prior to the initiation of the procedure. The right groin was prepped and draped in the usual sterile fashion. Using a micropuncture kit and the modified Seldinger technique, access was gained to the right common femoral artery and an 8 French sheath was placed. Real-time ultrasound guidance was utilized for vascular access including the acquisition of a permanent ultrasound image documenting patency of the accessed vessel. Under fluoroscopy, a Zoom 88 guide catheter was navigated over a 6 [place] Berenstein 2 catheter and a 0.035" Terumo Glidewire into the aortic arch. The catheter was placed into the left common carotid artery and then advanced into the left internal carotid artery. The diagnostic catheter was removed. Frontal, lateral angiograms and multiple magnified oblique angiograms of the head were obtained. FINDINGS: 1. Atherosclerotic changes of the right common femoral artery without hemodynamically significant stenosis. 2. Perfusion wedge defect in the left temporoparietal region. 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 PROCEDURE: Using biplane roadmap guidance, a 38 Socrates aspiration catheter was navigated over a phenom 21 microcatheter and an Aristotle 14 microguidewire into the cavernous segment of the left ICA. The microcatheter was then navigated over the wire into the  left M3/MCA posterior division branch. Multiple attempts necessary to access the occluded M3 branch. Then, the aspiration catheter  was advanced over the microcatheter into the occluded branch and connected to an aspiration pump. The microcatheter was removed. After approximately 1 minutes, the aspiration catheter was removed under constant aspiration. The guide catheter was aspirated for debris. Left internal carotid artery angiograms with frontal and lateral views of the head showed recanalization of the left M3 branch - TICI 2C. Slow flow is seen at the terminal cortical branches (M4). Flat panel CT of the head was obtained and post processed in a separate workstation with concurrent attending physician supervision. Selected images were sent to PACS. No evidence of hemorrhagic complication. Contrast staining of the basal ganglia and anterior left temporal lobe seen. Right common femoral artery angiogram was obtained in right anterior oblique view. The puncture is at the level of the common femoral artery. The artery has normal caliber, adequate for closure device. The sheath was exchanged over the wire for an 8 [place] Angio-Seal which was utilized for access closure. However, collagen plug herniated through the skin. Manual pressure was then held. Hemostasis was achieved. Patient was extubated. However, due to extreme agitation and combativeness, sedation and re-intubation was necessary. At this point, a groin hematoma was noted and systolic blood pressure dropped to 60 mmHg. Additional manual pressure was held at the access site for approximately 45 minutes. For better blood pressure monitoring, access was gained to the right radial artery using the modified Seldinger technique for arterial line placement. A 20 gauge catheter was utilized. Real-time ultrasound guidance was utilized for vascular access including the acquisition of a permanent ultrasound image documenting patency of the accessed vessel. Patient was then transferred to ICU in stable condition. IMPRESSION: Successful mechanical thrombectomy performed for treatment of a left  M3/MCA occlusion achieving complete recanalization after 1 pass with slow flow noted in the distal M4 branches (TICI 2 C). No hemorrhagic complication. PLAN: Transfer to ICU for continued post stroke care. Electronically Signed   By: Baldemar Lenis M.D.   On: 08/12/2022 10:42   MR BRAIN WO CONTRAST  Result Date: 08/12/2022 CLINICAL DATA:  72 year old female code stroke presentation yesterday with left MCA branch occlusion status post endovascular revascularization. EXAM: MRI HEAD WITHOUT CONTRAST MRA HEAD WITHOUT CONTRAST TECHNIQUE: Multiplanar, multi-echo pulse sequences of the brain and surrounding structures were acquired without intravenous contrast. Angiographic images of the Circle of Willis were acquired using MRA technique without intravenous contrast. COMPARISON:  CTA head and neck yesterday. Previous brain MRI 10/06/2017. FINDINGS: MRI HEAD FINDINGS Brain: Punctate diffusion restriction in the left caudate nucleus suspected on series 5, image 74. And mild scattered posterior left MCA, left MCA/PCA watershed cortical and white matter diffusion restriction (series 5, images 72 and 75). Mild associated cytotoxic edema. No hemorrhagic transformation; underlying chronic microhemorrhage in the left occipital lobe white matter series 16, image 28 on SWI. No contralateral right hemisphere or posterior fossa restricted diffusion. 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 Chronic right basal ganglia lacunar infarcts are new since that time. Widely scattered patchy bilateral cerebral white matter T2 and FLAIR hyperintensity otherwise not significantly changed. No midline shift, mass effect, evidence of mass lesion, ventriculomegaly, extra-axial collection or acute intracranial hemorrhage. Cervicomedullary junction and pituitary are within normal limits. Vascular: Major intracranial vascular flow voids appear stable. Skull and upper cervical spine: Negative for age visible  cervical spine. Visualized bone marrow signal is within normal limits. Sinuses/Orbits: Negative  orbits. Paranasal sinus mucosal thickening and fluid in the setting of intubation. Other: Fluid in the nasal cavity and nasopharynx in the setting of intubation. Mastoids remain well aerated. MRA HEAD FINDINGS Anterior circulation: Antegrade flow in both ICA siphons. Left greater than right ICA siphon irregularity. Patent carotid termini, MCA and right ACA origins. Left ACA A1 irregularity and stenosis is stable on series 1065, image 11. Other visible ACA branches are within normal limits. Right MCA M1 segment and visible right MCA branches are stable, including moderate posterior division irregularity and stenosis on series 1071, image 15). Left MCA M1 segment remains patent with mild distal M1 stenosis (series 1071, image 10). Left MCA branches appear more numerous compared to the CTA yesterday. There is residual moderate to severe tandem a regularity and stenosis in the most posterior division on series 1071, image 6. Posterior circulation: Antegrade flow in the posterior circulation. Dominant distal left vertebral artery, vertebrobasilar junction and basilar artery appear stable. Bilateral PICA, SCA and PCA origins remain patent. Posterior communicating arteries are diminutive or absent. Bilateral PCA branches appear stable. Anatomic variants: Dominant left vertebral artery. IMPRESSION: 1. Intracranial atherosclerosis. Improved appearance of Left MCA posterior division branches following endovascular reperfusion. 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 2. Mild, scattered small acute infarcts in the Left MCA and Left MCA/PCA watershed territories. No hemorrhagic transformation or mass effect. 3. No new intracranial abnormality. 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 Electronically Signed   By: Odessa Fleming  M.D.   On: 08/12/2022 10:24   MR ANGIO HEAD WO CONTRAST  Result Date: 08/12/2022 CLINICAL DATA:  72 year old female code stroke presentation yesterday with left MCA branch occlusion status post endovascular revascularization. EXAM: MRI HEAD WITHOUT CONTRAST MRA HEAD WITHOUT CONTRAST TECHNIQUE: Multiplanar, multi-echo pulse sequences of the brain and surrounding structures were acquired without intravenous contrast. Angiographic images of the Circle of Willis were acquired using MRA technique without intravenous contrast. COMPARISON:  CTA head and neck yesterday. Previous brain MRI 10/06/2017. FINDINGS: MRI HEAD FINDINGS Brain: Punctate diffusion restriction in the left caudate nucleus suspected on series 5, image 74. And mild scattered posterior left MCA, left MCA/PCA watershed cortical and white matter diffusion restriction (series 5, images 72 and 75). Mild associated cytotoxic edema. No hemorrhagic transformation; underlying chronic microhemorrhage in the left occipital lobe white matter series 16, image 28 on SWI. No contralateral right hemisphere or posterior fossa restricted diffusion. 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 Chronic right basal ganglia lacunar infarcts are new since that time. Widely scattered patchy bilateral cerebral white matter T2 and FLAIR hyperintensity otherwise not significantly changed. No midline shift, mass effect, evidence of mass lesion, ventriculomegaly, extra-axial collection or acute intracranial hemorrhage. Cervicomedullary junction and pituitary are within normal limits. Vascular: Major intracranial vascular flow voids appear stable. Skull and upper cervical spine: Negative for age visible cervical spine. Visualized bone marrow signal is within normal limits. Sinuses/Orbits: Negative orbits. Paranasal sinus mucosal thickening and fluid in the setting of intubation. Other: Fluid in the nasal cavity and nasopharynx in the setting of intubation. Mastoids  remain well aerated. MRA HEAD FINDINGS Anterior circulation: Antegrade flow in both ICA siphons. Left greater than right ICA siphon irregularity. Patent carotid termini, MCA and right ACA origins. Left ACA A1 irregularity and stenosis is stable on series 1065, image 11. Other visible ACA branches are within normal limits. Right MCA M1 segment and visible right  MCA branches are stable, including moderate posterior division irregularity and stenosis on series 1071, image 15). Left MCA M1 segment remains patent with mild distal M1 stenosis (series 1071, image 10). Left MCA branches appear more numerous compared to the CTA yesterday. There is residual moderate to severe tandem a regularity and stenosis in the most posterior division on series 1071, image 6. Posterior circulation: Antegrade flow in the posterior circulation. Dominant distal left vertebral artery, vertebrobasilar junction and basilar artery appear stable. Bilateral PICA, SCA and PCA origins remain patent. Posterior communicating arteries are diminutive or absent. Bilateral PCA branches appear stable. Anatomic variants: Dominant left vertebral artery. IMPRESSION: 1. Intracranial atherosclerosis. Improved appearance of Left MCA posterior division branches following endovascular reperfusion. 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 2. Mild, scattered small acute infarcts in the Left MCA and Left MCA/PCA watershed territories. No hemorrhagic transformation or mass effect. 3. No new intracranial abnormality. 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 Electronically Signed   By: Odessa Fleming M.D.   On: 08/12/2022 10:24   ARTERIAL PSEUDOANEURYSM COMPRESSION  Result Date: 08/12/2022  ARTERIAL PSEUDOANEURYSM  Patient Name:  AMBERIA BAYLESS  Date of Exam:   08/12/2022 Medical Rec #: [id number]        Accession #:    [id number] Date of Birth: 1950/05/13         Patient Gender: F Patient Age:   35 years Exam Location:  [facility] Procedure:      VAS [place] LOWER EXT ARTERIAL PSEUDOANEURYSM COMPRESSION Referring Phys: Jerilynn Mages DE MACEDO RODRIGUES --------------------------------------------------------------------------------  Exam: Right groin Indications: Patient complains of bruising and hematoma. History: S/p cerebral angiogram and thrombectomy 6/26. Comparison Study: No prior study Performing Technologist: Sherren Kerns RVS  Examination Guidelines: A complete evaluation includes B-mode imaging, spectral Doppler, color Doppler, and power Doppler as needed of all accessible portions of each vessel. Bilateral testing is considered an integral part of a complete examination. Limited examinations for reoccurring indications may be performed as noted.  Summary: No evidence of pseudoaneurysm, AVF or DVT    --------------------------------------------------------------------------------    Preliminary    Portable Chest x-ray  Result Date: 08/12/2022 CLINICAL DATA:  [id number] Endotracheal tube present [id number] EXAM: PORTABLE CHEST 1 VIEW COMPARISON:  Chest x-ray 07/08/2022, CT chest 04/23/2016 FINDINGS: Interval placement endotracheal tube with tip terminating 3 cm above the carina. Enteric tube coursing below the hemidiaphragm with tip and side port collimated off view. Enlarged cardiac silhouette. The heart and mediastinal contours are unchanged. Question retrocardiac airspace opacity. No pulmonary edema. No pleural effusion. No pneumothorax. No acute osseous abnormality. IMPRESSION: 1. Question retrocardiac airspace opacity. 2. Lines and tubes as above. Electronically Signed   By: Tish Frederickson M.D.   On: 08/12/2022 01:35   CT ANGIO HEAD NECK W WO CM W PERF (CODE STROKE)  Result Date: 08/11/2022 CLINICAL DATA:  Initial evaluation for neuro deficit, stroke. EXAM: CT ANGIOGRAPHY HEAD AND NECK CT PERFUSION BRAIN TECHNIQUE: Multidetector CT imaging of the head and  neck was performed using the standard protocol during bolus administration of intravenous contrast. Multiplanar CT image reconstructions and MIPs were obtained to evaluate the vascular anatomy. Carotid stenosis measurements (when applicable) are obtained utilizing NASCET criteria, using the distal internal carotid diameter as the denominator. Multiphase CT imaging of the brain was performed following IV bolus contrast injection. Subsequent parametric perfusion maps were calculated using RAPID software. RADIATION DOSE REDUCTION: This exam was performed  according to the departmental dose-optimization program which includes automated exposure control, adjustment of the mA and/or kV according to patient size and/or use of iterative reconstruction technique. CONTRAST:  75mL OMNIPAQUE IOHEXOL 350 MG/ML SOLN COMPARISON:  Prior CT from earlier the same day. FINDINGS: CTA NECK FINDINGS Aortic arch: Visualized arch normal in caliber with standard 3 vessel morphology. Moderate atheromatous change about the arch itself. No significant stenosis about the origin the great vessels. Right carotid system: Right common and internal carotid arteries are patent without dissection. Bulky calcified plaque about the right carotid bulb/proximal right ICA associated stenosis of up to 50% by NASCET criteria. Left carotid system: Left common and internal carotid arteries are patent without evidence for dissection. Bulky calcified plaque about the left carotid bulb/proximal left ICA. Associated stenosis of up to 75% by NASCET criteria (series 7, image 191). Vertebral arteries: Both vertebral arteries arise from subclavian arteries. No significant proximal subclavian artery stenosis. Atheromatous plaque at the origin of the left vertebral artery with associated moderate ostial stenosis. Additional scattered atheromatous change within the distal left V3 segment with moderate diffuse narrowing (series 7, image 162). The contralateral right  vertebral artery is largely occluded at its origin. Scant attenuated distal reconstitution at the right V2 segment via collaterals. Right vertebral artery is attenuated but patent distally to the skull base. No dissection. Skeleton: No discrete or worrisome osseous lesions. Anterior endplate degenerative spurring noted throughout the cervical spine. Other neck: No other acute soft tissue abnormality within the neck. Few thyroid nodules noted, largest of which measures 1.9 cm on the right. These have been previously evaluated by thyroid ultrasound on 05/11/2016 (ref: J Am Coll Radiol. 2015 Feb;12(2): 143-50). Upper chest: Mild emphysematous changes. 4 mm right upper lobe nodule (series 8, image 169), indeterminate. No other acute finding. Review of the MIP images confirms the above findings CTA HEAD FINDINGS Anterior circulation: Atheromatous change seen about the carotid siphons with associated moderate multifocal narrowing on the left and more mild narrowing on the right. A1 segments patent bilaterally. Left A1 hypoplastic. Normal anterior communicating complex. Anterior cerebral arteries patent without significant stenosis. No M1 stenosis or occlusion. Distal right MCA branches are patent and perfused. On the left, there is an acute stump occlusion of a proximal left M3 branch, inferior division (series 7, image 106). Remainder of the left MCA branches remain patent and perfused. Distal small vessel atheromatous irregularity noted. Posterior circulation: Atheromatous change within the dominant proximal left V4 segment with mild stenosis. Diminutive right V4 segment attenuated but patent without stenosis. Both PICA patent. Basilar irregular but patent without significant stenosis. Superior cerebral arteries patent bilaterally. Both PCAs primarily supplied via the basilar. Atheromatous irregularity about the PCAs bilaterally with associated short-segment moderate left P2 stenosis (series 11, image 23). PCAs  otherwise patent. Venous sinuses: Grossly patent allowing for timing the contrast bolus. Anatomic variants: As above.  No aneurysm. Review of the MIP images confirms the above findings CT Brain Perfusion Findings: ASPECTS: 10 CBF (<30%) Volume: 0mL Perfusion (Tmax>6.0s) volume: 23mL Mismatch Volume: 23mL Infarction Location:Negative CT perfusion for acute core infarct. 23 mL perfusion deficit at the posterior left MCA distribution, in keeping with the above described left M3 occlusion. IMPRESSION: 1. Acute left M3 stump occlusion, inferior division. 2. Negative CT perfusion for acute core infarct. 23 mL perfusion deficit at the posterior left MCA distribution, in keeping with the above described left M3 occlusion. 3. Bulky calcified plaque about the carotid bifurcations/proximal ICAs with associated stenoses of up to  75% on the left and 50% on the right. 4. Occlusion of the right vertebral artery at its origin. Attenuated distal reconstitution at the right V2 segment via collaterals. Atheromatous change about the dominant left V4 segment with associated moderate stenoses at its origin and about the left V3 segment. 5. Atheromatous change about the carotid siphons with associated moderate multifocal narrowing, left worse than right. 6. Short-segment moderate left P2 stenosis. 7. 4 mm right upper lobe nodule, indeterminate. Per Fleischner Society Guidelines, a non-contrast Chest CT at 12 months is optional. If performed and the nodule is stable at 12 months, no further follow-up is recommended. These guidelines do not apply to immunocompromised patients and patients with cancer. Follow up in patients with significant comorbidities as clinically warranted. For lung cancer screening, adhere to Lung-RADS guidelines. Reference: Radiology. 2017; 284(1):228-43. Aortic Atherosclerosis (ICD10-I70.0) and Emphysema (ICD10-J43.9). Critical Value/emergent results were called by telephone at the time of interpretation on 08/11/2022  at 8:42 pm to provider ERIC [facility] , who verbally acknowledged these results. Electronically Signed   By: Rise Mu M.D.   On: 08/11/2022 21:17   CT HEAD CODE STROKE WO CONTRAST  Result Date: 08/11/2022 CLINICAL DATA:  Code stroke. Initial evaluation for neuro deficit, stroke. EXAM: CT HEAD WITHOUT CONTRAST TECHNIQUE: Contiguous axial images were obtained from the base of the skull through the vertex without intravenous contrast. RADIATION DOSE REDUCTION: This exam was performed according to the departmental dose-optimization program which includes automated exposure control, adjustment of the mA and/or kV according to patient size and/or use of iterative reconstruction technique. COMPARISON:  Prior exam from 07/08/2022. FINDINGS: Brain: Age-related cerebral atrophy with moderate chronic microvascular ischemic disease. Remote right cerebellar infarct. Few small remote lacunar infarcts present about the basal adult basal bilateral basal ganglia. No acute intracranial hemorrhage. No acute large vessel territory infarct. No mass lesion, midline shift or mass effect. No hydrocephalus or extra-axial fluid collection. Vascular: No abnormal hyperdense vessel. Calcified atherosclerosis present at the skull base. Skull: Scalp soft tissues and calvarium demonstrate no acute finding. Sinuses/Orbits: Globes and orbits soft tissues within normal limits. Paranasal sinuses are clear. Trace left mastoid effusion, of doubtful significance. Other: None. ASPECTS [facility] Stroke Program Early CT Score) - Ganglionic level infarction (caudate, lentiform nuclei, internal capsule, insula, M1-M3 cortex): 7 - Supraganglionic infarction (M4-M6 cortex): 3 Total score (0-10 with 10 being normal): 10 IMPRESSION: 1. No acute intracranial abnormality. 2. ASPECTS is 10. 3. Age-related cerebral atrophy with moderate chronic small vessel ischemic disease. Small remote right cerebellar infarct. These results were communicated to Dr.  Otelia Limes at 8:20 pm on 08/11/2022 by text page via the [facility] messaging system. Electronically Signed   By: Rise Mu M.D.   On: 08/11/2022 20:21    Vitals:   08/12/22 1215 08/12/22 1230 08/12/22 1245 08/12/22 1246  BP: (!) 140/49 (!) 132/48 (!) 137/50 (!) 137/50  Pulse: 67 68 69 71  Resp: 17 18 17 17   Temp:      TempSrc:      SpO2: 98% 98% 99% 98%  Weight:      Height:         PHYSICAL EXAM General: Intubated elderly patient in no acute distress CV: Regular rate and rhythm on monitor Respiratory: Respirations synchronous with ventilator   NEURO (sedation with propofol paused):  Patient does not respond to name or follow commands.  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 Small vessel disease. Atrophy. ASPECTS 10.    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 Bulky calcified plaque about the carotid bifurcations/proximal ICAs with associated stenoses of up to 75% on the left and 50% on the right. Atheromatous change about the carotid siphons with associated moderate multifocal narrowing, left worse than right CT perfusion no core infarct, 23 mm perfusion deficit at posterior left M3 distribution IR left M3 occlusion s/p TICI2c with left M4 slow flow MRI scattered small acute infarcts in left MCA and left MCA/PCA watershed territories with no hemorrhagic transformation MRA improved appearance of left MCA posterior division  branches and residual moderate to severe posterior M2 tandem stenosis 2D Echo EF 70 to 75% LDL 82 HgbA1c pending VTE prophylaxis - SCDs clopidogrel 75 mg daily prior to admission, now on aspirin 81 mg daily and clopidogrel 75 mg daily  Therapy recommendations: Pending Disposition: Pending  Carotid stenosis CTA head & neck Bulky calcified plaque about the carotid bifurcations/proximal ICAs with associated stenoses of up to 75% on the left and 50% on the right. Atheromatous change about the carotid siphons with associated moderate multifocal narrowing, left worse than right Will discuss with IR Dr. Illene Bolus for next step   Hx of Stroke/TIA 09/2017 CT showed old left BG lacunar infarct. MRI showed right BG infarct. MRA right V4 stenosis.  Carotid Doppler negative.  EF 55 to 60%, LDL 117, A1c 6.2.  Discharged on DAPT and Lipitor 80.  Respiratory failure Patient was extubated after procedure but reintubated due to agitation Ventilator management per CCM SBT daily, extubate when able  Right groin hematoma Patient developed right groin hematoma after mechanical thrombectomy Manual pressure held Ultrasound reveals no pseudoaneurysm  History of hypertension currently hypotensive Home meds: Minoxidil 5 mg daily, carvedilol 25 mg twice daily, spironolactone 25 mg daily, valsartan 320 mg daily Unstable requiring phenylephrine Wean phenylephrine as able Blood Pressure Goal: SBP 120-160 for first 24 hours then less than 180   Hyperlipidemia Home meds: None LDL 82, goal < 70 Add rosuvastatin 20 mg daily Continue statin at discharge  Diabetes type II  Home meds: None HgbA1c pending, goal < 7.0 CBGs SSI Recommend close follow-up with PCP for better DM control  Dysphagia  NPO intubated On IVF, will start TF Speech eval after extubation  Other Stroke Risk Factors Obesity, Body mass index is 49.91 kg/m., BMI >/= 30 associated with increased stroke risk, recommend weight loss, diet  and exercise as appropriate  Obstructive sleep apnea  Other Active Problems Right upper lung nodule-recommend noncontrast chest CT in 12 months UA WBC >50, no antibiotic needed at this time per pharmacy.  Hospital day # 1  Patient seen by NP and then by MD, MD to edit note is needed Cortney E Ernestina Columbia , MSN, AGACNP-BC [facility] See Amion for schedule and pager information 08/12/2022 3:06 PM   ATTENDING NOTE: I reviewed above note and agree with the assessment and plan. Pt was seen and examined.   No family at bedside.  Patient still intubated, just off sedation for neurocheck,  slightly open eyes on voice, not follow commands.  PERRL, doll's eye absent, left corneal positive, right corneal absent, positive gag and cough. withdraws upper extremities to noxious stimuli, seems symmetrical, and moves toes to light noxious stimuli in lower extremities, also seemed symmetrical.  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 Sherlon Handing for further plan.  Currently on DAPT and statin.  Wean off ventilation as able.  DC A-line, BP management per cuff pressure.  Relax BP less than 180 after 24 hours IR.  Right groin ultrasound showed no pseudoaneurysm.  PT and OT pending.  For detailed assessment and plan, please refer to above/below as I have made changes wherever appropriate.   Marvel Plan, MD PhD Stroke Neurology 08/12/2022 5:35 PM  This patient is critically ill due to left MCA stroke status post thrombectomy, left ICA stenosis, respiratory failure, hypotension on pressor and at significant risk of neurological worsening, death form recurrent stroke, hemorrhagic transformation, seizure, aspiration. This patient's care requires constant monitoring of vital signs, hemodynamics, respiratory and cardiac monitoring, review of multiple databases, neurological assessment,  discussion with family, other specialists and medical decision making of high complexity. I spent 40 minutes of neurocritical care time in the care of this patient.  I discussed with IR Dr. Sherlon Handing.    To contact Stroke Continuity provider, please refer to [email or web address]. After hours, contact General Neurology

## 2022-08-12 NOTE — Progress Notes (Signed)
OT Cancellation Note  Patient Details Name: [name] [name] MRN: [id number] DOB: 19-Sep-1950   Cancelled Treatment:    Reason Eval/Treat Not Completed: Patient not medically ready  [name] [name],[name] [name] 08/12/2022, 3:54 PM Luisa Dago, OT/L   Acute OT Clinical Specialist Acute Rehabilitation Services Pager [phone] Office [phone]

## 2022-08-12 NOTE — Progress Notes (Signed)
Pt extubated to 4L [place] and tolerating well, positive cuff leaked was heard prior to extubation. Pt was able to speak her name post extubation, no stridor noted, vitals are stable at this time.

## 2022-08-12 NOTE — ED Provider Notes (Signed)
[facility]UR[facility]AU[facility]/SURGICAL ICU Provider Note  CSN: [id number] Arrival date & time: 08/11/22 1956  Chief Complaint(s) Code Stroke  HPI [name] [name] is a 72 y.o. female with PMH T2DM, previous CVA, HTN, HLD, obesity who presents emergency department as a code stroke due to concern for dysarthria and garbled speech.  Last known well 1 PM on 08/11/2022.  Family previously saw her speaking normally, but then returned to find her speaking abnormally and EMS was called.  On arrival, patient with continued dysarthria and garbled speech but denies numbness, tingling, weakness or other neurologic complaints.   Past Medical History Past Medical History:  Diagnosis Date   Arthritis    "right knee" (07/02/2015)   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   Diabetes (HCC)    type II   Hyperlipidemia    Hypertension    Obesity    OSA (obstructive sleep apnea)    Overactive bladder    S/P right knee arthroscopy    with septic joint growing out MSSA   Septic arthritis of knee, right (HCC)    Wears glasses    Patient Active Problem List   Diagnosis Date Noted   Hematoma of groin 08/12/2022   Endotracheal tube present 08/12/2022   AKI (acute kidney injury) (HCC) 08/12/2022   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/11/2022   Stroke (HCC) 10/06/2017   Acute CVA (cerebrovascular accident) (HCC) 10/06/2017   Trigger thumb, right thumb 05/03/2016   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 Authorizing Provider  albuterol (VENTOLIN HFA) 108 (90 Base) MCG/ACT  inhaler Inhale 2 puffs into the lungs every 4 (four) hours as needed for wheezing or shortness of breath. 07/25/20   [provider]  carvedilol (COREG) 25 MG tablet Take 50 mg by mouth 2 (two) times daily. 06/16/20   [provider]  clopidogrel (PLAVIX) 75 MG tablet Take 1 tablet (75 mg total) by mouth daily. 10/11/17   Albertine Grates, MD  cyclobenzaprine (FLEXERIL) 10 MG tablet Take 10 mg by mouth 2 (two) times daily as needed for muscle spasms. 07/16/20   [provider]  diclofenac (VOLTAREN) 75 MG EC tablet Take 75 mg by mouth daily as needed for mild pain or moderate pain.    [provider]  estradiol (ESTRACE) 0.1 MG/GM vaginal cream Place 1 Applicatorful vaginally daily as needed (vaginal burning). 06/30/20   [provider]  furosemide (LASIX) 20 MG tablet Take 1 tablet (20 mg total) by mouth daily. 10/25/20   Melene Plan, DO  gabapentin (NEURONTIN) 100 MG capsule Take 1 capsule (100 mg total) by mouth 3 (three) times daily. 06/19/22   Pricilla Loveless, MD  lidocaine (XYLOCAINE) 2 % solution Use as directed 15 mLs in the mouth or throat every 3 (three) hours as needed for mouth pain (Sore throat). 03/31/21   Theadora Rama Scales, PA-C  minoxidil (LONITEN) 2.5 MG tablet Take 5 mg by mouth daily. 06/23/20   [provider]  mupirocin cream (BACTROBAN) 2 % Apply 1 application topically daily as needed (rash under breast).  [provider]  nystatin (MYCOSTATIN/NYSTOP) powder Apply 1 application topically 3 (three) times daily as needed for rash. 06/11/20   [provider]  pantoprazole (PROTONIX) 40 MG tablet Take 40 mg by mouth daily as needed (heartburn).    [provider]  solifenacin (VESICARE) 10 MG tablet Take 10 mg by mouth at bedtime. 07/01/20   [provider]  spironolactone (ALDACTONE) 25 MG tablet Take 25 mg by mouth daily. 06/06/20   [provider]  triamcinolone lotion (KENALOG) 0.1 % Apply 1  application topically daily as needed (Reash under breast).    [provider]  valsartan (DIOVAN) 320 MG tablet Take 320 mg by mouth at bedtime. 06/24/20   [provider]                                                                                                                                    Past Surgical History Past Surgical History:  Procedure Laterality Date   BIOPSY  08/05/2020   Procedure: BIOPSY;  Surgeon: Kathi Der, MD;  Location: [facility];  Service: Gastroenterology;;   COLONOSCOPY     COLONOSCOPY WITH PROPOFOL N/A 08/05/2020   Procedure: COLONOSCOPY WITH PROPOFOL;  Surgeon: Kathi Der, MD;  Location: [facility];  Service: Gastroenterology;  Laterality: N/A;   DIAGNOSTIC LAPAROSCOPY     IR CT HEAD LTD  08/12/2022   IR PERCUTANEOUS ART THROMBECTOMY/INFUSION INTRACRANIAL INC DIAG ANGIO  08/12/2022   IR US GUIDE VASC ACCESS RIGHT  08/12/2022   IR US GUIDE VASC ACCESS RIGHT  08/12/2022   JOINT REPLACEMENT     KNEE ARTHROSCOPY Right 12/09/2012   Procedure: ARTHROSCOPIC IRRIGATION AND DEBRIDEMENT RIGHT KNEE;  Surgeon: Budd Palmer, MD;  Location: [facility] OR;  Service: Orthopedics;  Laterality: Right;   LEFT HEART CATH AND CORONARY ANGIOGRAPHY N/A 09/02/2016   Procedure: Left Heart Cath and Coronary Angiography;  Surgeon: Rinaldo Cloud, MD;  Location: [facility] INVASIVE CV LAB;  Service: Cardiovascular;  Laterality: N/A;   RADIOLOGY WITH ANESTHESIA N/A 08/11/2022   Procedure: IR WITH ANESTHESIA;  Surgeon: Julieanne Cotton, MD;  Location: [facility] OR;  Service: Radiology;  Laterality: N/A;   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/06/2014   Procedure: PLACEMENT OF CEMENT SPACER;  Surgeon: Loreta Ave, MD;  Location: [facility] OR;  Service: Orthopedics;  Laterality: Right;   TOTAL KNEE ARTHROPLASTY Bilateral 2011-2012   left-right   TOTAL KNEE REVISION Right 11/06/2014   Procedure: REMOVAL OF TOTAL COMPONENTS, EXTENSIVE  IRRIGATION AND DEBRIDEMENT  RIGHT  KNEE;  Surgeon: Loreta Ave, MD;  Location: [facility] OR;  Service: Orthopedics;  Laterality: Right;   TOTAL KNEE REVISION Right 07/02/2015   TOTAL KNEE REVISION Right 07/02/2015   Procedure: TOTAL KNEE REVISION;  Surgeon: Loreta Ave, MD;  Location: [facility] OR;  Service: Orthopedics;  Laterality: Right;   TUBAL LIGATION  ~ 1980   tummy tuck  1980s   Family  History Family History  Problem Relation Age of Onset   Breast cancer Mother    Stroke Father    Hypertension Other     Social History Social History   Tobacco Use   Smoking status: Former    Packs/day: 0.50    Years: 12.00    Additional pack years: 0.00    Total pack years: 6.00    Types: Cigarettes    Quit date: 08/01/1992    Years since quitting: 30.0   Smokeless tobacco: Never  Vaping Use   Vaping Use: Never used  Substance Use Topics   Alcohol use: No   Drug use: No   Allergies Metoprolol tartrate  Review of Systems Review of Systems  Neurological:  Positive for speech difficulty.    Physical Exam Vital Signs  I have reviewed the triage vital signs BP (!) 137/50   Pulse 71   Temp 97.9 F (36.6 C) (Oral)   Resp 17   Ht 5\' 4"  (1.626 m)   Wt 131.9 kg   SpO2 98%   BMI 49.91 kg/m   Physical Exam Vitals and nursing note reviewed.  Constitutional:      General: She is not in acute distress.    Appearance: She is well-developed.  HENT:     Head: Normocephalic and atraumatic.  Eyes:     Conjunctiva/sclera: Conjunctivae normal.  Cardiovascular:     Rate and Rhythm: Normal rate and regular rhythm.     Heart sounds: No murmur heard. Pulmonary:     Effort: Pulmonary effort is normal. No respiratory distress.     Breath sounds: Normal breath sounds.  Abdominal:     Palpations: Abdomen is soft.     Tenderness: There is no abdominal tenderness.  Musculoskeletal:        General: No swelling.     Cervical back: Neck supple.  Skin:    General: Skin is warm and dry.     Capillary Refill: Capillary refill  takes less than 2 seconds.  Neurological:     Mental Status: She is alert.     Sensory: No sensory deficit.     Motor: No weakness.     Comments: Garbled speech  Psychiatric:        Mood and Affect: Mood normal.     ED Results and Treatments Labs (all labs ordered are listed, but only abnormal results are displayed) Labs Reviewed  CBC - Abnormal; Notable for the following components:      Result Value   RBC 5.58 (*)    HCT 46.1 (*)    MCH 25.6 (*)    RDW 16.5 (*)    All other components within normal limits  COMPREHENSIVE METABOLIC PANEL - Abnormal; Notable for the following components:   Glucose, Bld 107 (*)    Creatinine, Ser 1.39 (*)    AST 14 (*)    GFR, Estimated 41 (*)    All other components within normal limits  URINALYSIS, W/ REFLEX TO CULTURE (INFECTION SUSPECTED) - Abnormal; Notable for the following components:   APPearance HAZY (*)    Specific Gravity, Urine >1.046 (*)    Protein, ur 30 (*)    Leukocytes,Ua LARGE (*)    Bacteria, UA RARE (*)    All other components within normal limits  LIPID PANEL - Abnormal; Notable for the following components:   HDL 34 (*)    All other components within normal limits  GLUCOSE, CAPILLARY - Abnormal; Notable for the following components:  Glucose-Capillary 156 (*)    All other components within normal limits  CBC - Abnormal; Notable for the following components:   MCH 25.9 (*)    RDW 16.2 (*)    All other components within normal limits  BASIC METABOLIC PANEL - Abnormal; Notable for the following components:   CO2 21 (*)    Glucose, Bld 148 (*)    Calcium 8.6 (*)    All other components within normal limits  PHOSPHORUS - Abnormal; Notable for the following components:   Phosphorus 2.1 (*)    All other components within normal limits  GLUCOSE, CAPILLARY - Abnormal; Notable for the following components:   Glucose-Capillary 124 (*)    All other components within normal limits  GLUCOSE, CAPILLARY - Abnormal; Notable  for the following components:   Glucose-Capillary 137 (*)    All other components within normal limits  GLUCOSE, CAPILLARY - Abnormal; Notable for the following components:   Glucose-Capillary 112 (*)    All other components within normal limits  I-STAT CHEM 8, ED - Abnormal; Notable for the following components:   Creatinine, Ser 1.40 (*)    Glucose, Bld 104 (*)    Hemoglobin 16.0 (*)    HCT 47.0 (*)    All other components within normal limits  CBG MONITORING, ED - Abnormal; Notable for the following components:   Glucose-Capillary 112 (*)    All other components within normal limits  POCT I-STAT 7, (LYTES, BLD GAS, ICA,H+H) - Abnormal; Notable for the following components:   Acid-base deficit 5.0 (*)    HCT 34.0 (*)    Hemoglobin 11.6 (*)    All other components within normal limits  MRSA NEXT GEN BY PCR, NASAL  URINE CULTURE  PROTIME-INR  APTT  DIFFERENTIAL  ETHANOL  HEMOGLOBIN AND HEMATOCRIT, BLOOD  HEMOGLOBIN AND HEMATOCRIT, BLOOD  MAGNESIUM  HEMOGLOBIN A1C  RAPID URINE DRUG SCREEN, HOSP PERFORMED                                                                                                                          Radiology IR PERCUTANEOUS ART THROMBECTOMY/INFUSION INTRACRANIAL INC DIAG ANGIO  Result Date: 08/12/2022 INDICATION: [name] [name] is an 72 year old female presenting with acute onset of aphasia and right mid hemianopia; NIHSS 4. Her past medical history significant for arthritis, right basal ganglia stroke, DM2, HLD, HTN, morbid obesity and OSA; premorbid modified Rankin scale 0. Her last known well was 1:00 PM on 08/11/2022. Head CT showed no evidence of an acute infarct or hemorrhage. CT angiogram of the head and neck showed a left M3/MCA posterior division branch occlusion. She comes to our service for mechanical thrombectomy. EXAM: ULTRASOUND-GUIDED VASCULAR ACCESS DIAGNOSTIC CEREBRAL ANGIOGRAM MECHANICAL THROMBECTOMY FLAT PANEL HEAD CT ARTERIAL LINE  PLACEMENT COMPARISON:  CT/CT angiogram of the head and neck 08/11/2022. MEDICATIONS: Ancef 3 g IV. ANESTHESIA/SEDATION: The procedure was performed under general anesthesia. CONTRAST:  64 mL of Omnipaque 300 milligram/mL FLUOROSCOPY: Radiation Exposure Index (as  provided by the fluoroscopic device): 2,328 mGy Kerma COMPLICATIONS: SIR Level A - No therapy, no consequence. TECHNIQUE: Informed written consent was waved due to altered mental status of patient with no healthcare proxy or next of kin available and emergent nature of procedure. Three physicians agreed that the procedure was necessary. Maximal Sterile Barrier Technique was utilized including caps, mask, sterile gowns, sterile gloves, sterile drape, hand hygiene and skin antiseptic. A timeout was performed prior to the initiation of the procedure. The right groin was prepped and draped in the usual sterile fashion. Using a micropuncture kit and the modified Seldinger technique, access was gained to the right common femoral artery and an 8 French sheath was placed. Real-time ultrasound guidance was utilized for vascular access including the acquisition of a permanent ultrasound image documenting patency of the accessed vessel. Under fluoroscopy, a Zoom 88 guide catheter was navigated over a 6 [place] Berenstein 2 catheter and a 0.035" Terumo Glidewire into the aortic arch. The catheter was placed into the left common carotid artery and then advanced into the left internal carotid artery. The diagnostic catheter was removed. Frontal, lateral angiograms and multiple magnified oblique angiograms of the head were obtained. FINDINGS: 1. Atherosclerotic changes of the right common femoral artery without hemodynamically significant stenosis. 2. Perfusion wedge defect in the left temporoparietal region. 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 PROCEDURE: Using biplane roadmap guidance, a 38 Socrates aspiration catheter was navigated over a  phenom 21 microcatheter and an Aristotle 14 microguidewire into the cavernous segment of the left ICA. The microcatheter was then navigated over the wire into the left M3/MCA posterior division branch. Multiple attempts necessary to access the occluded M3 branch. Then, the aspiration catheter was advanced over the microcatheter into the occluded branch and connected to an aspiration pump. The microcatheter was removed. After approximately 1 minutes, the aspiration catheter was removed under constant aspiration. The guide catheter was aspirated for debris. Left internal carotid artery angiograms with frontal and lateral views of the head showed recanalization of the left M3 branch - TICI 2C. Slow flow is seen at the terminal cortical branches (M4). Flat panel CT of the head was obtained and post processed in a separate workstation with concurrent attending physician supervision. Selected images were sent to PACS. No evidence of hemorrhagic complication. Contrast staining of the basal ganglia and anterior left temporal lobe seen. Right common femoral artery angiogram was obtained in right anterior oblique view. The puncture is at the level of the common femoral artery. The artery has normal caliber, adequate for closure device. The sheath was exchanged over the wire for an 8 [place] Angio-Seal which was utilized for access closure. However, collagen plug herniated through the skin. Manual pressure was then held. Hemostasis was achieved. Patient was extubated. However, due to extreme agitation and combativeness, sedation and re-intubation was necessary. At this point, a groin hematoma was noted and systolic blood pressure dropped to 60 mmHg. Additional manual pressure was held at the access site for approximately 45 minutes. For better blood pressure monitoring, access was gained to the right radial artery using the modified Seldinger technique for arterial line placement. A 20 gauge catheter was utilized. Real-time  ultrasound guidance was utilized for vascular access including the acquisition of a permanent ultrasound image documenting patency of the accessed vessel. Patient was then transferred to ICU in stable condition. IMPRESSION: Successful mechanical thrombectomy performed for treatment of a left M3/MCA occlusion achieving complete recanalization after 1 pass with slow flow  noted in the distal M4 branches (TICI 2 C). No hemorrhagic complication. PLAN: Transfer to ICU for continued post stroke care. Electronically Signed   By: Baldemar Lenis M.D.   On: 08/12/2022 10:42   IR US Guide Vasc Access Right  Result Date: 08/12/2022 INDICATION: [name] [name] is an 72 year old female presenting with acute onset of aphasia and right mid hemianopia; NIHSS 4. Her past medical history significant for arthritis, right basal ganglia stroke, DM2, HLD, HTN, morbid obesity and OSA; premorbid modified Rankin scale 0. Her last known well was 1:00 PM on 08/11/2022. Head CT showed no evidence of an acute infarct or hemorrhage. CT angiogram of the head and neck showed a left M3/MCA posterior division branch occlusion. She comes to our service for mechanical thrombectomy. EXAM: ULTRASOUND-GUIDED VASCULAR ACCESS DIAGNOSTIC CEREBRAL ANGIOGRAM MECHANICAL THROMBECTOMY FLAT PANEL HEAD CT ARTERIAL LINE PLACEMENT COMPARISON:  CT/CT angiogram of the head and neck 08/11/2022. MEDICATIONS: Ancef 3 g IV. ANESTHESIA/SEDATION: The procedure was performed under general anesthesia. CONTRAST:  64 mL of Omnipaque 300 milligram/mL FLUOROSCOPY: Radiation Exposure Index (as provided by the fluoroscopic device): 2,328 mGy Kerma COMPLICATIONS: SIR Level A - No therapy, no consequence. TECHNIQUE: Informed written consent was waved due to altered mental status of patient with no healthcare proxy or next of kin available and emergent nature of procedure. Three physicians agreed that the procedure was necessary. Maximal Sterile Barrier Technique was  utilized including caps, mask, sterile gowns, sterile gloves, sterile drape, hand hygiene and skin antiseptic. A timeout was performed prior to the initiation of the procedure. The right groin was prepped and draped in the usual sterile fashion. Using a micropuncture kit and the modified Seldinger technique, access was gained to the right common femoral artery and an 8 French sheath was placed. Real-time ultrasound guidance was utilized for vascular access including the acquisition of a permanent ultrasound image documenting patency of the accessed vessel. Under fluoroscopy, a Zoom 88 guide catheter was navigated over a 6 [place] Berenstein 2 catheter and a 0.035" Terumo Glidewire into the aortic arch. The catheter was placed into the left common carotid artery and then advanced into the left internal carotid artery. The diagnostic catheter was removed. Frontal, lateral angiograms and multiple magnified oblique angiograms of the head were obtained. FINDINGS: 1. Atherosclerotic changes of the right common femoral artery without hemodynamically significant stenosis. 2. Perfusion wedge defect in the left temporoparietal region. 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 PROCEDURE: Using biplane roadmap guidance, a 38 Socrates aspiration catheter was navigated over a phenom 21 microcatheter and an Aristotle 14 microguidewire into the cavernous segment of the left ICA. The microcatheter was then navigated over the wire into the left M3/MCA posterior division branch. Multiple attempts necessary to access the occluded M3 branch. Then, the aspiration catheter was advanced over the microcatheter into the occluded branch and connected to an aspiration pump. The microcatheter was removed. After approximately 1 minutes, the aspiration catheter was removed under constant aspiration. The guide catheter was aspirated for debris. Left internal carotid artery angiograms with frontal and lateral views of the  head showed recanalization of the left M3 branch - TICI 2C. Slow flow is seen at the terminal cortical branches (M4). Flat panel CT of the head was obtained and post processed in a separate workstation with concurrent attending physician supervision. Selected images were sent to PACS. No evidence of hemorrhagic complication. Contrast staining of the basal ganglia and anterior left temporal lobe seen. Right common femoral  artery angiogram was obtained in right anterior oblique view. The puncture is at the level of the common femoral artery. The artery has normal caliber, adequate for closure device. The sheath was exchanged over the wire for an 8 [place] Angio-Seal which was utilized for access closure. However, collagen plug herniated through the skin. Manual pressure was then held. Hemostasis was achieved. Patient was extubated. However, due to extreme agitation and combativeness, sedation and re-intubation was necessary. At this point, a groin hematoma was noted and systolic blood pressure dropped to 60 mmHg. Additional manual pressure was held at the access site for approximately 45 minutes. For better blood pressure monitoring, access was gained to the right radial artery using the modified Seldinger technique for arterial line placement. A 20 gauge catheter was utilized. Real-time ultrasound guidance was utilized for vascular access including the acquisition of a permanent ultrasound image documenting patency of the accessed vessel. Patient was then transferred to ICU in stable condition. IMPRESSION: Successful mechanical thrombectomy performed for treatment of a left M3/MCA occlusion achieving complete recanalization after 1 pass with slow flow noted in the distal M4 branches (TICI 2 C). No hemorrhagic complication. PLAN: Transfer to ICU for continued post stroke care. Electronically Signed   By: Baldemar Lenis M.D.   On: 08/12/2022 10:42   IR CT Head Ltd  Result Date: 08/12/2022 INDICATION:  [name] [name] is an 72 year old female presenting with acute onset of aphasia and right mid hemianopia; NIHSS 4. Her past medical history significant for arthritis, right basal ganglia stroke, DM2, HLD, HTN, morbid obesity and OSA; premorbid modified Rankin scale 0. Her last known well was 1:00 PM on 08/11/2022. Head CT showed no evidence of an acute infarct or hemorrhage. CT angiogram of the head and neck showed a left M3/MCA posterior division branch occlusion. She comes to our service for mechanical thrombectomy. EXAM: ULTRASOUND-GUIDED VASCULAR ACCESS DIAGNOSTIC CEREBRAL ANGIOGRAM MECHANICAL THROMBECTOMY FLAT PANEL HEAD CT ARTERIAL LINE PLACEMENT COMPARISON:  CT/CT angiogram of the head and neck 08/11/2022. MEDICATIONS: Ancef 3 g IV. ANESTHESIA/SEDATION: The procedure was performed under general anesthesia. CONTRAST:  64 mL of Omnipaque 300 milligram/mL FLUOROSCOPY: Radiation Exposure Index (as provided by the fluoroscopic device): 2,328 mGy Kerma COMPLICATIONS: SIR Level A - No therapy, no consequence. TECHNIQUE: Informed written consent was waved due to altered mental status of patient with no healthcare proxy or next of kin available and emergent nature of procedure. Three physicians agreed that the procedure was necessary. Maximal Sterile Barrier Technique was utilized including caps, mask, sterile gowns, sterile gloves, sterile drape, hand hygiene and skin antiseptic. A timeout was performed prior to the initiation of the procedure. The right groin was prepped and draped in the usual sterile fashion. Using a micropuncture kit and the modified Seldinger technique, access was gained to the right common femoral artery and an 8 French sheath was placed. Real-time ultrasound guidance was utilized for vascular access including the acquisition of a permanent ultrasound image documenting patency of the accessed vessel. Under fluoroscopy, a Zoom 88 guide catheter was navigated over a 6 [place] Berenstein 2 catheter  and a 0.035" Terumo Glidewire into the aortic arch. The catheter was placed into the left common carotid artery and then advanced into the left internal carotid artery. The diagnostic catheter was removed. Frontal, lateral angiograms and multiple magnified oblique angiograms of the head were obtained. FINDINGS: 1. Atherosclerotic changes of the right common femoral artery without hemodynamically significant stenosis. 2. Perfusion wedge defect in the left temporoparietal region. Suspected  M3 occlusion although no opacification of stump is seen. PROCEDURE: Using biplane roadmap guidance, a 38 Socrates aspiration catheter was navigated over a phenom 21 microcatheter and an Aristotle 14 microguidewire into the cavernous segment of the left ICA. The microcatheter was then navigated over the wire into the left M3/MCA posterior division branch. Multiple attempts necessary to access the occluded M3 branch. Then, the aspiration catheter was advanced over the microcatheter into the occluded branch and connected to an aspiration pump. The microcatheter was removed. After approximately 1 minutes, the aspiration catheter was removed under constant aspiration. The guide catheter was aspirated for debris. Left internal carotid artery angiograms with frontal and lateral views of the head showed recanalization of the left M3 branch - TICI 2C. Slow flow is seen at the terminal cortical branches (M4). Flat panel CT of the head was obtained and post processed in a separate workstation with concurrent attending physician supervision. Selected images were sent to PACS. No evidence of hemorrhagic complication. Contrast staining of the basal ganglia and anterior left temporal lobe seen. Right common femoral artery angiogram was obtained in right anterior oblique view. The puncture is at the level of the common femoral artery. The artery has normal caliber, adequate for closure device. The sheath was exchanged over the wire for an 8 [place]  Angio-Seal which was utilized for access closure. However, collagen plug herniated through the skin. Manual pressure was then held. Hemostasis was achieved. Patient was extubated. However, due to extreme agitation and combativeness, sedation and re-intubation was necessary. At this point, a groin hematoma was noted and systolic blood pressure dropped to 60 mmHg. Additional manual pressure was held at the access site for approximately 45 minutes. For better blood pressure monitoring, access was gained to the right radial artery using the modified Seldinger technique for arterial line placement. A 20 gauge catheter was utilized. Real-time ultrasound guidance was utilized for vascular access including the acquisition of a permanent ultrasound image documenting patency of the accessed vessel. Patient was then transferred to ICU in stable condition. IMPRESSION: Successful mechanical thrombectomy performed for treatment of a left M3/MCA occlusion achieving complete recanalization after 1 pass with slow flow noted in the distal M4 branches (TICI 2 C). No hemorrhagic complication. PLAN: Transfer to ICU for continued post stroke care. Electronically Signed   By: Baldemar Lenis M.D.   On: 08/12/2022 10:42   IR US Guide Vasc Access Right  Result Date: 08/12/2022 INDICATION: [name] [name] is an 72 year old female presenting with acute onset of aphasia and right mid hemianopia; NIHSS 4. Her past medical history significant for arthritis, right basal ganglia stroke, DM2, HLD, HTN, morbid obesity and OSA; premorbid modified Rankin scale 0. Her last known well was 1:00 PM on 08/11/2022. Head CT showed no evidence of an acute infarct or hemorrhage. CT angiogram of the head and neck showed a left M3/MCA posterior division branch occlusion. She comes to our service for mechanical thrombectomy. EXAM: ULTRASOUND-GUIDED VASCULAR ACCESS DIAGNOSTIC CEREBRAL ANGIOGRAM MECHANICAL THROMBECTOMY FLAT PANEL HEAD CT ARTERIAL  LINE PLACEMENT COMPARISON:  CT/CT angiogram of the head and neck 08/11/2022. MEDICATIONS: Ancef 3 g IV. ANESTHESIA/SEDATION: The procedure was performed under general anesthesia. CONTRAST:  64 mL of Omnipaque 300 milligram/mL FLUOROSCOPY: Radiation Exposure Index (as provided by the fluoroscopic device): 2,328 mGy Kerma COMPLICATIONS: SIR Level A - No therapy, no consequence. TECHNIQUE: Informed written consent was waved due to altered mental status of patient with no healthcare proxy or next of kin available and emergent nature  of procedure. Three physicians agreed that the procedure was necessary. Maximal Sterile Barrier Technique was utilized including caps, mask, sterile gowns, sterile gloves, sterile drape, hand hygiene and skin antiseptic. A timeout was performed prior to the initiation of the procedure. The right groin was prepped and draped in the usual sterile fashion. Using a micropuncture kit and the modified Seldinger technique, access was gained to the right common femoral artery and an 8 French sheath was placed. Real-time ultrasound guidance was utilized for vascular access including the acquisition of a permanent ultrasound image documenting patency of the accessed vessel. Under fluoroscopy, a Zoom 88 guide catheter was navigated over a 6 [place] Berenstein 2 catheter and a 0.035" Terumo Glidewire into the aortic arch. The catheter was placed into the left common carotid artery and then advanced into the left internal carotid artery. The diagnostic catheter was removed. Frontal, lateral angiograms and multiple magnified oblique angiograms of the head were obtained. FINDINGS: 1. Atherosclerotic changes of the right common femoral artery without hemodynamically significant stenosis. 2. Perfusion wedge defect in the left temporoparietal region. 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 PROCEDURE: Using biplane roadmap guidance, a 38 Socrates aspiration catheter was navigated over  a phenom 21 microcatheter and an Aristotle 14 microguidewire into the cavernous segment of the left ICA. The microcatheter was then navigated over the wire into the left M3/MCA posterior division branch. Multiple attempts necessary to access the occluded M3 branch. Then, the aspiration catheter was advanced over the microcatheter into the occluded branch and connected to an aspiration pump. The microcatheter was removed. After approximately 1 minutes, the aspiration catheter was removed under constant aspiration. The guide catheter was aspirated for debris. Left internal carotid artery angiograms with frontal and lateral views of the head showed recanalization of the left M3 branch - TICI 2C. Slow flow is seen at the terminal cortical branches (M4). Flat panel CT of the head was obtained and post processed in a separate workstation with concurrent attending physician supervision. Selected images were sent to PACS. No evidence of hemorrhagic complication. Contrast staining of the basal ganglia and anterior left temporal lobe seen. Right common femoral artery angiogram was obtained in right anterior oblique view. The puncture is at the level of the common femoral artery. The artery has normal caliber, adequate for closure device. The sheath was exchanged over the wire for an 8 [place] Angio-Seal which was utilized for access closure. However, collagen plug herniated through the skin. Manual pressure was then held. Hemostasis was achieved. Patient was extubated. However, due to extreme agitation and combativeness, sedation and re-intubation was necessary. At this point, a groin hematoma was noted and systolic blood pressure dropped to 60 mmHg. Additional manual pressure was held at the access site for approximately 45 minutes. For better blood pressure monitoring, access was gained to the right radial artery using the modified Seldinger technique for arterial line placement. A 20 gauge catheter was utilized. Real-time  ultrasound guidance was utilized for vascular access including the acquisition of a permanent ultrasound image documenting patency of the accessed vessel. Patient was then transferred to ICU in stable condition. IMPRESSION: Successful mechanical thrombectomy performed for treatment of a left M3/MCA occlusion achieving complete recanalization after 1 pass with slow flow noted in the distal M4 branches (TICI 2 C). No hemorrhagic complication. PLAN: Transfer to ICU for continued post stroke care. Electronically Signed   By: Baldemar Lenis M.D.   On: 08/12/2022 10:42   MR BRAIN WO  CONTRAST  Result Date: 08/12/2022 CLINICAL DATA:  72 year old female code stroke presentation yesterday with left MCA branch occlusion status post endovascular revascularization. EXAM: MRI HEAD WITHOUT CONTRAST MRA HEAD WITHOUT CONTRAST TECHNIQUE: Multiplanar, multi-echo pulse sequences of the brain and surrounding structures were acquired without intravenous contrast. Angiographic images of the Circle of Willis were acquired using MRA technique without intravenous contrast. COMPARISON:  CTA head and neck yesterday. Previous brain MRI 10/06/2017. FINDINGS: MRI HEAD FINDINGS Brain: Punctate diffusion restriction in the left caudate nucleus suspected on series 5, image 74. And mild scattered posterior left MCA, left MCA/PCA watershed cortical and white matter diffusion restriction (series 5, images 72 and 75). Mild associated cytotoxic edema. No hemorrhagic transformation; underlying chronic microhemorrhage in the left occipital lobe white matter series 16, image 28 on [facility]. No contralateral right hemisphere or posterior fossa restricted diffusion. 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 Chronic right basal ganglia lacunar infarcts are new since that time. Widely scattered patchy bilateral cerebral white matter T2 and FLAIR hyperintensity otherwise not significantly changed. No midline shift, mass effect,  evidence of mass lesion, ventriculomegaly, extra-axial collection or acute intracranial hemorrhage. Cervicomedullary junction and pituitary are within normal limits. Vascular: Major intracranial vascular flow voids appear stable. Skull and upper cervical spine: Negative for age visible cervical spine. Visualized bone marrow signal is within normal limits. Sinuses/Orbits: Negative orbits. Paranasal sinus mucosal thickening and fluid in the setting of intubation. Other: Fluid in the nasal cavity and nasopharynx in the setting of intubation. Mastoids remain well aerated. MRA HEAD FINDINGS Anterior circulation: Antegrade flow in both ICA siphons. Left greater than right ICA siphon irregularity. Patent carotid termini, MCA and right ACA origins. Left ACA A1 irregularity and stenosis is stable on series 1065, image 11. Other visible ACA branches are within normal limits. Right MCA M1 segment and visible right MCA branches are stable, including moderate posterior division irregularity and stenosis on series 1071, image 15). Left MCA M1 segment remains patent with mild distal M1 stenosis (series 1071, image 10). Left MCA branches appear more numerous compared to the CTA yesterday. There is residual moderate to severe tandem a regularity and stenosis in the most posterior division on series 1071, image 6. Posterior circulation: Antegrade flow in the posterior circulation. Dominant distal left vertebral artery, vertebrobasilar junction and basilar artery appear stable. Bilateral PICA, SCA and PCA origins remain patent. Posterior communicating arteries are diminutive or absent. Bilateral PCA branches appear stable. Anatomic variants: Dominant left vertebral artery. IMPRESSION: 1. Intracranial atherosclerosis. Improved appearance of Left MCA posterior division branches following endovascular reperfusion. 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 2. Mild, scattered small acute infarcts in the Left MCA and Left  MCA/PCA watershed territories. No hemorrhagic transformation or mass effect. 3. No new intracranial abnormality. 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 Electronically Signed   By: Odessa Fleming M.D.   On: 08/12/2022 10:24   MR ANGIO HEAD WO CONTRAST  Result Date: 08/12/2022 CLINICAL DATA:  72 year old female code stroke presentation yesterday with left MCA branch occlusion status post endovascular revascularization. EXAM: MRI HEAD WITHOUT CONTRAST MRA HEAD WITHOUT CONTRAST TECHNIQUE: Multiplanar, multi-echo pulse sequences of the brain and surrounding structures were acquired without intravenous contrast. Angiographic images of the Circle of Willis were acquired using MRA technique without intravenous contrast. COMPARISON:  CTA head and neck yesterday. Previous brain MRI 10/06/2017. FINDINGS: MRI HEAD FINDINGS Brain: Punctate diffusion restriction in  the left caudate nucleus suspected on series 5, image 74. And mild scattered posterior left MCA, left MCA/PCA watershed cortical and white matter diffusion restriction (series 5, images 72 and 75). Mild associated cytotoxic edema. No hemorrhagic transformation; underlying chronic microhemorrhage in the left occipital lobe white matter series 16, image 28 on [facility]. No contralateral right hemisphere or posterior fossa restricted diffusion. 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 Chronic right basal ganglia lacunar infarcts are new since that time. Widely scattered patchy bilateral cerebral white matter T2 and FLAIR hyperintensity otherwise not significantly changed. No midline shift, mass effect, evidence of mass lesion, ventriculomegaly, extra-axial collection or acute intracranial hemorrhage. Cervicomedullary junction and pituitary are within normal limits. Vascular: Major intracranial vascular flow voids appear stable. Skull and upper cervical spine:  Negative for age visible cervical spine. Visualized bone marrow signal is within normal limits. Sinuses/Orbits: Negative orbits. Paranasal sinus mucosal thickening and fluid in the setting of intubation. Other: Fluid in the nasal cavity and nasopharynx in the setting of intubation. Mastoids remain well aerated. MRA HEAD FINDINGS Anterior circulation: Antegrade flow in both ICA siphons. Left greater than right ICA siphon irregularity. Patent carotid termini, MCA and right ACA origins. Left ACA A1 irregularity and stenosis is stable on series 1065, image 11. Other visible ACA branches are within normal limits. Right MCA M1 segment and visible right MCA branches are stable, including moderate posterior division irregularity and stenosis on series 1071, image 15). Left MCA M1 segment remains patent with mild distal M1 stenosis (series 1071, image 10). Left MCA branches appear more numerous compared to the CTA yesterday. There is residual moderate to severe tandem a regularity and stenosis in the most posterior division on series 1071, image 6. Posterior circulation: Antegrade flow in the posterior circulation. Dominant distal left vertebral artery, vertebrobasilar junction and basilar artery appear stable. Bilateral PICA, SCA and PCA origins remain patent. Posterior communicating arteries are diminutive or absent. Bilateral PCA branches appear stable. Anatomic variants: Dominant left vertebral artery. IMPRESSION: 1. Intracranial atherosclerosis. Improved appearance of Left MCA posterior division branches following endovascular reperfusion. 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 2. Mild, scattered small acute infarcts in the Left MCA and Left MCA/PCA watershed territories. No hemorrhagic transformation or mass effect. 3. No new intracranial abnormality. 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  Electronically Signed   By: Odessa Fleming M.D.   On: 08/12/2022 10:24   ARTERIAL PSEUDOANEURYSM COMPRESSION  Result Date: 08/12/2022  ARTERIAL PSEUDOANEURYSM  Patient Name:  [name] [name]  Date of Exam:   08/12/2022 Medical Rec #: [id number]        Accession #:    [id number] Date of Birth: August 24, 1950        Patient Gender: F Patient Age:   69 years Exam Location:  [facility] Procedure:      VAS [place] LOWER EXT ARTERIAL PSEUDOANEURYSM COMPRESSION Referring Phys: Jerilynn Mages DE MACEDO RODRIGUES --------------------------------------------------------------------------------  Exam: Right groin Indications: Patient complains of bruising and hematoma. History: S/p cerebral angiogram and thrombectomy 6/26. Comparison Study: No prior study Performing Technologist: Sherren Kerns RVS  Examination Guidelines: A complete evaluation includes B-mode imaging, spectral Doppler, color Doppler, and power Doppler as needed of all accessible portions of each vessel. Bilateral testing is considered an integral part of a complete examination. Limited examinations for reoccurring indications may be performed as noted.  Summary: No evidence of pseudoaneurysm, AVF or DVT    --------------------------------------------------------------------------------  Preliminary    Portable Chest x-ray  Result Date: 08/12/2022 CLINICAL DATA:  [id number] Endotracheal tube present [id number] EXAM: PORTABLE CHEST 1 VIEW COMPARISON:  Chest x-ray 07/08/2022, CT chest 04/23/2016 FINDINGS: Interval placement endotracheal tube with tip terminating 3 cm above the carina. Enteric tube coursing below the hemidiaphragm with tip and side port collimated off view. Enlarged cardiac silhouette. The heart and mediastinal contours are unchanged. Question retrocardiac airspace opacity. No pulmonary edema. No pleural effusion. No pneumothorax. No acute osseous abnormality. IMPRESSION: 1. Question retrocardiac airspace opacity. 2. Lines and tubes as above. Electronically  Signed   By: Tish Frederickson M.D.   On: 08/12/2022 01:35   CT ANGIO HEAD NECK W WO CM W PERF (CODE STROKE)  Result Date: 08/11/2022 CLINICAL DATA:  Initial evaluation for neuro deficit, stroke. EXAM: CT ANGIOGRAPHY HEAD AND NECK CT PERFUSION BRAIN TECHNIQUE: Multidetector CT imaging of the head and neck was performed using the standard protocol during bolus administration of intravenous contrast. Multiplanar CT image reconstructions and MIPs were obtained to evaluate the vascular anatomy. Carotid stenosis measurements (when applicable) are obtained utilizing NASCET criteria, using the distal internal carotid diameter as the denominator. Multiphase CT imaging of the brain was performed following IV bolus contrast injection. Subsequent parametric perfusion maps were calculated using RAPID software. RADIATION DOSE REDUCTION: This exam was performed according to the departmental dose-optimization program which includes automated exposure control, adjustment of the mA and/or kV according to patient size and/or use of iterative reconstruction technique. CONTRAST:  75mL OMNIPAQUE IOHEXOL 350 MG/ML SOLN COMPARISON:  Prior CT from earlier the same day. FINDINGS: CTA NECK FINDINGS Aortic arch: Visualized arch normal in caliber with standard 3 vessel morphology. Moderate atheromatous change about the arch itself. No significant stenosis about the origin the great vessels. Right carotid system: Right common and internal carotid arteries are patent without dissection. Bulky calcified plaque about the right carotid bulb/proximal right ICA associated stenosis of up to 50% by NASCET criteria. Left carotid system: Left common and internal carotid arteries are patent without evidence for dissection. Bulky calcified plaque about the left carotid bulb/proximal left ICA. Associated stenosis of up to 75% by NASCET criteria (series 7, image 191). Vertebral arteries: Both vertebral arteries arise from subclavian arteries. No  significant proximal subclavian artery stenosis. Atheromatous plaque at the origin of the left vertebral artery with associated moderate ostial stenosis. Additional scattered atheromatous change within the distal left V3 segment with moderate diffuse narrowing (series 7, image 162). The contralateral right vertebral artery is largely occluded at its origin. Scant attenuated distal reconstitution at the right V2 segment via collaterals. Right vertebral artery is attenuated but patent distally to the skull base. No dissection. Skeleton: No discrete or worrisome osseous lesions. Anterior endplate degenerative spurring noted throughout the cervical spine. Other neck: No other acute soft tissue abnormality within the neck. Few thyroid nodules noted, largest of which measures 1.9 cm on the right. These have been previously evaluated by thyroid ultrasound on 05/11/2016 (ref: J Am Coll Radiol. 2015 Feb;12(2): 143-50). Upper chest: Mild emphysematous changes. 4 mm right upper lobe nodule (series 8, image 169), indeterminate. No other acute finding. Review of the MIP images confirms the above findings CTA HEAD FINDINGS Anterior circulation: Atheromatous change seen about the carotid siphons with associated moderate multifocal narrowing on the left and more mild narrowing on the right. A1 segments patent bilaterally. Left A1 hypoplastic. Normal anterior communicating complex. Anterior cerebral arteries patent without significant stenosis. No M1 stenosis or occlusion. Distal right MCA  branches are patent and perfused. On the left, there is an acute stump occlusion of a proximal left M3 branch, inferior division (series 7, image 106). Remainder of the left MCA branches remain patent and perfused. Distal small vessel atheromatous irregularity noted. Posterior circulation: Atheromatous change within the dominant proximal left V4 segment with mild stenosis. Diminutive right V4 segment attenuated but patent without stenosis. Both  PICA patent. Basilar irregular but patent without significant stenosis. Superior cerebral arteries patent bilaterally. Both PCAs primarily supplied via the basilar. Atheromatous irregularity about the PCAs bilaterally with associated short-segment moderate left P2 stenosis (series 11, image 23). PCAs otherwise patent. Venous sinuses: Grossly patent allowing for timing the contrast bolus. Anatomic variants: As above.  No aneurysm. Review of the MIP images confirms the above findings CT Brain Perfusion Findings: ASPECTS: 10 CBF (<30%) Volume: 0mL Perfusion (Tmax>6.0s) volume: 23mL Mismatch Volume: 23mL Infarction Location:Negative CT perfusion for acute core infarct. 23 mL perfusion deficit at the posterior left MCA distribution, in keeping with the above described left M3 occlusion. IMPRESSION: 1. Acute left M3 stump occlusion, inferior division. 2. Negative CT perfusion for acute core infarct. 23 mL perfusion deficit at the posterior left MCA distribution, in keeping with the above described left M3 occlusion. 3. Bulky calcified plaque about the carotid bifurcations/proximal ICAs with associated stenoses of up to 75% on the left and 50% on the right. 4. Occlusion of the right vertebral artery at its origin. Attenuated distal reconstitution at the right V2 segment via collaterals. Atheromatous change about the dominant left V4 segment with associated moderate stenoses at its origin and about the left V3 segment. 5. Atheromatous change about the carotid siphons with associated moderate multifocal narrowing, left worse than right. 6. Short-segment moderate left P2 stenosis. 7. 4 mm right upper lobe nodule, indeterminate. Per Fleischner Society Guidelines, a non-contrast Chest CT at 12 months is optional. If performed and the nodule is stable at 12 months, no further follow-up is recommended. These guidelines do not apply to immunocompromised patients and patients with cancer. Follow up in patients with significant  comorbidities as clinically warranted. For lung cancer screening, adhere to Lung-RADS guidelines. Reference: Radiology. 2017; 284(1):228-43. Aortic Atherosclerosis (ICD10-I70.0) and Emphysema (ICD10-J43.9). Critical Value/emergent results were called by telephone at the time of interpretation on 08/11/2022 at 8:42 pm to provider [facility] , who verbally acknowledged these results. Electronically Signed   By: Rise Mu M.D.   On: 08/11/2022 21:17   CT HEAD CODE STROKE WO CONTRAST  Result Date: 08/11/2022 CLINICAL DATA:  Code stroke. Initial evaluation for neuro deficit, stroke. EXAM: CT HEAD WITHOUT CONTRAST TECHNIQUE: Contiguous axial images were obtained from the base of the skull through the vertex without intravenous contrast. RADIATION DOSE REDUCTION: This exam was performed according to the departmental dose-optimization program which includes automated exposure control, adjustment of the mA and/or kV according to patient size and/or use of iterative reconstruction technique. COMPARISON:  Prior exam from 07/08/2022. FINDINGS: Brain: Age-related cerebral atrophy with moderate chronic microvascular ischemic disease. Remote right cerebellar infarct. Few small remote lacunar infarcts present about the basal adult basal bilateral basal ganglia. No acute intracranial hemorrhage. No acute large vessel territory infarct. No mass lesion, midline shift or mass effect. No hydrocephalus or extra-axial fluid collection. Vascular: No abnormal hyperdense vessel. Calcified atherosclerosis present at the skull base. Skull: Scalp soft tissues and calvarium demonstrate no acute finding. Sinuses/Orbits: Globes and orbits soft tissues within normal limits. Paranasal sinuses are clear. Trace left mastoid effusion, of doubtful significance.  Other: None. ASPECTS [facility] Stroke Program Early CT Score) - Ganglionic level infarction (caudate, lentiform nuclei, internal capsule, insula, M1-M3 cortex): 7 - Supraganglionic  infarction (M4-M6 cortex): 3 Total score (0-10 with 10 being normal): 10 IMPRESSION: 1. No acute intracranial abnormality. 2. ASPECTS is 10. 3. Age-related cerebral atrophy with moderate chronic small vessel ischemic disease. Small remote right cerebellar infarct. These results were communicated to Dr. Otelia Limes at 8:20 pm on 08/11/2022 by text page via the [facility] messaging system. Electronically Signed   By: Rise Mu M.D.   On: 08/11/2022 20:21    Pertinent labs & imaging results that were available during my care of the patient were reviewed by me and considered in my medical decision making (see MDM for details).  Medications Ordered in ED Medications  0.9 %  sodium chloride infusion ( Intravenous Infusion Verify 08/12/22 1200)  acetaminophen (TYLENOL) tablet 650 mg (has no administration in time range)    Or  acetaminophen (TYLENOL) 160 MG/5ML solution 650 mg (has no administration in time range)    Or  acetaminophen (TYLENOL) suppository 650 mg (has no administration in time range)  senna-docusate (Senokot-S) tablet 1 tablet (has no administration in time range)  insulin aspart (novoLOG) injection 0-20 Units ( Subcutaneous Not Given 08/12/22 1200)  sodium chloride 0.9 % bolus 250 mL (has no administration in time range)  clevidipine (CLEVIPREX) infusion 0.5 mg/mL ( Intravenous Not Given 08/12/22 0058)  Chlorhexidine Gluconate Cloth 2 % PADS 6 each (6 each Topical Given 08/12/22 0035)  famotidine (PEPCID) tablet 20 mg (20 mg Per Tube Given 08/12/22 1038)  docusate (COLACE) 50 MG/5ML liquid 100 mg (100 mg Per Tube Given 08/12/22 1038)  polyethylene glycol (MIRALAX / GLYCOLAX) packet 17 g (17 g Per Tube Given 08/12/22 1038)  fentaNYL (SUBLIMAZE) injection 25 mcg (has no administration in time range)  fentaNYL (SUBLIMAZE) injection 25-100 mcg (50 mcg Intravenous Given 08/12/22 0406)  propofol (DIPRIVAN) 1000 MG/100ML infusion (0 mcg/kg/min  130.2 kg Intravenous Stopped 08/12/22 1059)  0.9 %   sodium chloride infusion (250 mLs Intravenous Not Given 08/12/22 0056)  phenylephrine (NEO-SYNEPHRINE) 20mg /NS premix infusion (40 mcg/min Intravenous Infusion Verify 08/12/22 1200)  albuterol (PROVENTIL) (2.5 MG/3ML) 0.083% nebulizer solution 2.5 mg (has no administration in time range)  Oral care mouth rinse (15 mLs Mouth Rinse Given 08/12/22 1205)  Oral care mouth rinse (has no administration in time range)  dexmedetomidine (PRECEDEX) 400 MCG/100ML (4 mcg/mL) infusion (1 mcg/kg/hr  131.9 kg Intravenous New Bag/Given 08/12/22 1247)  perflutren lipid microspheres (DEFINITY) IV suspension (3 mLs Intravenous Given 08/12/22 1130)  sodium chloride flush (NS) 0.9 % injection 3 mL (3 mLs Intravenous Given 08/11/22 2043)  iohexol (OMNIPAQUE) 350 MG/ML injection 75 mL (75 mLs Intravenous Contrast Given 08/11/22 2031)  iohexol (OMNIPAQUE) 300 MG/ML solution 150 mL (64 mLs Intra-arterial Contrast Given 08/11/22 2239)   stroke: early stages of recovery book (1 each Does not apply Given 08/12/22 1039)  lactated ringers bolus 500 mL ( Intravenous Stopped 08/12/22 0201)  Procedures .Critical Care  Performed by: Glendora Score, MD Authorized by: Glendora Score, MD   Critical care provider statement:    Critical care time (minutes):  30   Critical care was necessary to treat or prevent imminent or life-threatening deterioration of the following conditions:  CNS failure or compromise   Critical care was time spent personally by me on the following activities:  Development of treatment plan with patient or surrogate, discussions with consultants, evaluation of patient's response to treatment, examination of patient, ordering and review of laboratory studies, ordering and review of radiographic studies, ordering and performing treatments and interventions, pulse oximetry,  re-evaluation of patient's condition and review of old charts   (including critical care time)  Medical Decision Making / ED Course   This patient presents to the ED for concern of garbled speech, this involves an extensive number of treatment options, and is a complaint that carries with it a high risk of complications and morbidity.  The differential diagnosis includes CVA, hypoglycemia, electrolyte abnormality, toxic encephalopathy, polypharmacy, metabolic encephalopathy  MDM: Patient seen emergency room for evaluation of speech difficulty.  Patient arrives as a code stroke and initial exam revealing garbled speech but neurologic exam otherwise unremarkable.  Patient taken immediately to CT where I worked alongside the stroke neurologist Dr. Otelia Limes.  Stroke imaging concerning for an acute left M3 stump occlusion.  Patient outside the window for TNK but does qualify for IR thrombectomy.  We were unable to contact family and thus Dr. Otelia Limes and I performed a two-physician consult and determined that a speech deficit would be life-changing and that the benefits of thrombectomy outweigh the risks at this time.  Patient then admitted to the stroke ICU and will be taken for thrombectomy.   Additional history obtained:  -External records from outside source obtained and reviewed including: Chart review including previous notes, labs, imaging, consultation notes   Lab Tests: -I ordered, reviewed, and interpreted labs.   The pertinent results include:   Labs Reviewed  CBC - Abnormal; Notable for the following components:      Result Value   RBC 5.58 (*)    HCT 46.1 (*)    MCH 25.6 (*)    RDW 16.5 (*)    All other components within normal limits  COMPREHENSIVE METABOLIC PANEL - Abnormal; Notable for the following components:   Glucose, Bld 107 (*)    Creatinine, Ser 1.39 (*)    AST 14 (*)    GFR, Estimated 41 (*)    All other components within normal limits  URINALYSIS, W/ REFLEX TO  CULTURE (INFECTION SUSPECTED) - Abnormal; Notable for the following components:   APPearance HAZY (*)    Specific Gravity, Urine >1.046 (*)    Protein, ur 30 (*)    Leukocytes,Ua LARGE (*)    Bacteria, UA RARE (*)    All other components within normal limits  LIPID PANEL - Abnormal; Notable for the following components:   HDL 34 (*)    All other components within normal limits  GLUCOSE, CAPILLARY - Abnormal; Notable for the following components:   Glucose-Capillary 156 (*)    All other components within normal limits  CBC - Abnormal; Notable for the following components:   MCH 25.9 (*)    RDW 16.2 (*)    All other components within normal limits  BASIC METABOLIC PANEL - Abnormal; Notable for the following components:   CO2 21 (*)    Glucose, Bld 148 (*)    Calcium  8.6 (*)    All other components within normal limits  PHOSPHORUS - Abnormal; Notable for the following components:   Phosphorus 2.1 (*)    All other components within normal limits  GLUCOSE, CAPILLARY - Abnormal; Notable for the following components:   Glucose-Capillary 124 (*)    All other components within normal limits  GLUCOSE, CAPILLARY - Abnormal; Notable for the following components:   Glucose-Capillary 137 (*)    All other components within normal limits  GLUCOSE, CAPILLARY - Abnormal; Notable for the following components:   Glucose-Capillary 112 (*)    All other components within normal limits  I-STAT CHEM 8, ED - Abnormal; Notable for the following components:   Creatinine, Ser 1.40 (*)    Glucose, Bld 104 (*)    Hemoglobin 16.0 (*)    HCT 47.0 (*)    All other components within normal limits  CBG MONITORING, ED - Abnormal; Notable for the following components:   Glucose-Capillary 112 (*)    All other components within normal limits  POCT I-STAT 7, (LYTES, BLD GAS, ICA,H+H) - Abnormal; Notable for the following components:   Acid-base deficit 5.0 (*)    HCT 34.0 (*)    Hemoglobin 11.6 (*)    All  other components within normal limits  MRSA NEXT GEN BY PCR, NASAL  URINE CULTURE  PROTIME-INR  APTT  DIFFERENTIAL  ETHANOL  HEMOGLOBIN AND HEMATOCRIT, BLOOD  HEMOGLOBIN AND HEMATOCRIT, BLOOD  MAGNESIUM  HEMOGLOBIN A1C  RAPID URINE DRUG SCREEN, HOSP PERFORMED      EKG   EKG Interpretation Date/Time:  Wednesday August 11 2022 20:40:19 EDT Ventricular Rate:  74 PR Interval:  138 QRS Duration:  109 QT Interval:  513 QTC Calculation: 570 R Axis:   74  Text Interpretation: Sinus rhythm Consider right atrial enlargement Borderline ST depression, diffuse leads Prolonged QT interval QT has lengthened Confirmed by Jacalyn Lefevre [phone]) on 08/12/2022 11:20:54 AM         Imaging Studies ordered: I ordered imaging studies including CT head, CT angio brain with perfusion I independently visualized and interpreted imaging. I agree with the radiologist interpretation   Medicines ordered and prescription drug management: Meds ordered this encounter  Medications   sodium chloride flush (NS) 0.9 % injection 3 mL   iohexol (OMNIPAQUE) 350 MG/ML injection 75 mL   iohexol (OMNIPAQUE) 300 MG/ML solution 150 mL    stroke: early stages of recovery book   0.9 %  sodium chloride infusion   OR Linked Order Group    acetaminophen (TYLENOL) tablet 650 mg    acetaminophen (TYLENOL) 160 MG/5ML solution 650 mg    acetaminophen (TYLENOL) suppository 650 mg   senna-docusate (Senokot-S) tablet 1 tablet   insulin aspart (novoLOG) injection 0-20 Units    Order Specific Question:   Correction coverage:    Answer:   Resistant (obese, steroids)    Order Specific Question:   CBG < 70:    Answer:   Implement Hypoglycemia Standing Orders and refer to Hypoglycemia Standing Orders sidebar report    Order Specific Question:   CBG 70 - 120:    Answer:   0 units    Order Specific Question:   CBG 121 - 150:    Answer:   3 units    Order Specific Question:   CBG 151 - 200:    Answer:   4 units    Order  Specific Question:   CBG 201 - 250:    Answer:  7 units    Order Specific Question:   CBG 251 - 300:    Answer:   11 units    Order Specific Question:   CBG 301 - 350:    Answer:   15 units    Order Specific Question:   CBG 351 - 400:    Answer:   20 units    Order Specific Question:   CBG > 400    Answer:   call MD and obtain STAT lab verification   DISCONTD: carvedilol (COREG) tablet 50 mg   DISCONTD: albuterol (VENTOLIN HFA) 108 (90 Base) MCG/ACT inhaler 2 puff   DISCONTD: spironolactone (ALDACTONE) tablet 25 mg   DISCONTD: irbesartan (AVAPRO) tablet 37.5 mg   DISCONTD: carvedilol (COREG) tablet 25 mg   sodium chloride 0.9 % bolus 250 mL   DISCONTD: acetaminophen (TYLENOL) tablet 650 mg   DISCONTD: acetaminophen (TYLENOL) 160 MG/5ML solution 650 mg   DISCONTD: acetaminophen (TYLENOL) suppository 650 mg   clevidipine (CLEVIPREX) infusion 0.5 mg/mL   Chlorhexidine Gluconate Cloth 2 % PADS 6 each   DISCONTD: Oral care mouth rinse   famotidine (PEPCID) tablet 20 mg   docusate (COLACE) 50 MG/5ML liquid 100 mg   polyethylene glycol (MIRALAX / GLYCOLAX) packet 17 g   fentaNYL (SUBLIMAZE) injection 25 mcg   fentaNYL (SUBLIMAZE) injection 25-100 mcg   propofol (DIPRIVAN) 1000 MG/100ML infusion   0.9 %  sodium chloride infusion   phenylephrine (NEO-SYNEPHRINE) 20mg /NS premix infusion    Order Specific Question:   IV Access    Answer:   Peripheral   lactated ringers bolus 500 mL   albuterol (PROVENTIL) (2.5 MG/3ML) 0.083% nebulizer solution 2.5 mg   Oral care mouth rinse   Oral care mouth rinse   dexmedetomidine (PRECEDEX) 400 MCG/100ML (4 mcg/mL) infusion   perflutren lipid microspheres (DEFINITY) IV suspension    -I have reviewed the patients home medicines and have made adjustments as needed  Critical interventions Stroke activation and management prior to transfer to VIR  Consultations Obtained: I requested consultation with the stroke neurologist Dr. Otelia Limes,  and  discussed lab and imaging findings as well as pertinent plan - they recommend: Neuro ICU admission and VIR   Cardiac Monitoring: The patient was maintained on a cardiac monitor.  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

## 2022-08-12 NOTE — Progress Notes (Signed)
Referring Physician(s): Dr Jerel Shepherd  Supervising Physician: Baldemar Lenis  Patient Status:  [facility] - In-pt  Chief Complaint:  Left M3 occlusion.  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 Mechanical thrombectomy performed with direct contact aspiration with complete recanalization after one pass. Slow flow noted in distal M4 branch (TICI 2C). Post procedural flat panel CT showed basal ganglia and anterior temporal contrast staining. No parenchymal or subarachnoid hemorrhage. Immediately after extubation patient became very agitated and combative. Hematoma was noted in the thigh and groin. Pressure held. Patient was reintubated. [name] Arterial line placed. Hematoma stable and marked  MRI this am- IMPRESSION: 1. Intracranial atherosclerosis. Improved appearance of Left MCA posterior division branches following endovascular reperfusion. 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 2. Mild, scattered small acute infarcts in the Left MCA and Left MCA/PCA watershed territories. No hemorrhagic transformation or mass effect. 3. No new intracranial abnormality. 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   [place] groin: Neg for pseudoaneurysm  Off sedation now Moving some in bed Opens eyes to command; squeezes hands  Allergies: Metoprolol tartrate  Medications: Prior to Admission medications   Medication Sig Start Date End Date Taking? Authorizing Provider  albuterol (VENTOLIN HFA) 108 (90 Base) MCG/ACT inhaler Inhale 2 puffs into the lungs every 4 (four) hours as needed for wheezing or shortness of breath. 07/25/20   [provider]  carvedilol (COREG) 25 MG tablet Take 50 mg by mouth 2 (two) times daily. 06/16/20   [provider]  clopidogrel (PLAVIX) 75 MG  tablet Take 1 tablet (75 mg total) by mouth daily. 10/11/17   Albertine Grates, MD  cyclobenzaprine (FLEXERIL) 10 MG tablet Take 10 mg by mouth 2 (two) times daily as needed for muscle spasms. 07/16/20   [provider]  diclofenac (VOLTAREN) 75 MG EC tablet Take 75 mg by mouth daily as needed for mild pain or moderate pain.    [provider]  estradiol (ESTRACE) 0.1 MG/GM vaginal cream Place 1 Applicatorful vaginally daily as needed (vaginal burning). 06/30/20   [provider]  furosemide (LASIX) 20 MG tablet Take 1 tablet (20 mg total) by mouth daily. 10/25/20   Melene Plan, DO  gabapentin (NEURONTIN) 100 MG capsule Take 1 capsule (100 mg total) by mouth 3 (three) times daily. 06/19/22   Pricilla Loveless, MD  lidocaine (XYLOCAINE) 2 % solution Use as directed 15 mLs in the mouth or throat every 3 (three) hours as needed for mouth pain (Sore throat). 03/31/21   Theadora Rama Scales, PA-C  minoxidil (LONITEN) 2.5 MG tablet Take 5 mg by mouth daily. 06/23/20   [provider]  mupirocin cream (BACTROBAN) 2 % Apply 1 application topically daily as needed (rash under breast).    [provider]  nystatin (MYCOSTATIN/NYSTOP) powder Apply 1 application topically 3 (three) times daily as needed for rash. 06/11/20   [provider]  pantoprazole (PROTONIX) 40 MG tablet Take 40 mg by mouth daily as needed (heartburn).    [provider]  solifenacin (VESICARE) 10 MG tablet Take 10 mg by mouth at bedtime. 07/01/20   [provider]  spironolactone (ALDACTONE) 25 MG tablet Take 25 mg by mouth daily. 06/06/20   [provider]  triamcinolone lotion (KENALOG) 0.1 % Apply 1 application topically daily as needed (Reash under  breast).    [provider]  valsartan (DIOVAN) 320 MG tablet Take 320 mg by mouth at bedtime. 06/24/20   [provider]     Vital Signs: BP (!) 161/51   Pulse 76   Temp 97.6 F (36.4 C) (Axillary)   Resp 16    Ht 5\' 4"  (1.626 m)   Wt 290 lb 12.6 oz (131.9 kg)   SpO2 100%   BMI 49.91 kg/m   Physical Exam Constitutional:      Comments: Intubated   Eyes:     Comments: Opens eyes to my command; tries to focus  Musculoskeletal:     Comments: Rt groin soft; no hematoma appreciated Rt foot pulses by doppler  Able to squeeze my hand Bilat - to command No movement of legs/feet     Skin:    General: Skin is warm.     Imaging: IR PERCUTANEOUS ART THROMBECTOMY/INFUSION INTRACRANIAL INC DIAG ANGIO  Result Date: 08/12/2022 INDICATION: Lylla Eifler is an 72 year old female presenting with acute onset of aphasia and right mid hemianopia; NIHSS 4. Her past medical history significant for arthritis, right basal ganglia stroke, DM2, HLD, HTN, morbid obesity and OSA; premorbid modified Rankin scale 0. Her last known well was 1:00 PM on 08/11/2022. Head CT showed no evidence of an acute infarct or hemorrhage. CT angiogram of the head and neck showed a left M3/MCA posterior division branch occlusion. She comes to our service for mechanical thrombectomy. EXAM: ULTRASOUND-GUIDED VASCULAR ACCESS DIAGNOSTIC CEREBRAL ANGIOGRAM MECHANICAL THROMBECTOMY FLAT PANEL HEAD CT ARTERIAL LINE PLACEMENT COMPARISON:  CT/CT angiogram of the head and neck 08/11/2022. MEDICATIONS: Ancef 3 g IV. ANESTHESIA/SEDATION: The procedure was performed under general anesthesia. CONTRAST:  64 mL of Omnipaque 300 milligram/mL FLUOROSCOPY: Radiation Exposure Index (as provided by the fluoroscopic device): 2,328 mGy Kerma COMPLICATIONS: SIR Level A - No therapy, no consequence. TECHNIQUE: Informed written consent was waved due to altered mental status of patient with no healthcare proxy or next of kin available and emergent nature of procedure. Three physicians agreed that the procedure was necessary. Maximal Sterile Barrier Technique was utilized including caps, mask, sterile gowns, sterile gloves, sterile drape, hand hygiene and skin  antiseptic. A timeout was performed prior to the initiation of the procedure. The right groin was prepped and draped in the usual sterile fashion. Using a micropuncture kit and the modified Seldinger technique, access was gained to the right common femoral artery and an 8 French sheath was placed. Real-time ultrasound guidance was utilized for vascular access including the acquisition of a permanent ultrasound image documenting patency of the accessed vessel. Under fluoroscopy, a Zoom 88 guide catheter was navigated over a 6 [place] Berenstein 2 catheter and a 0.035" Terumo Glidewire into the aortic arch. The catheter was placed into the left common carotid artery and then advanced into the left internal carotid artery. The diagnostic catheter was removed. Frontal, lateral angiograms and multiple magnified oblique angiograms of the head were obtained. FINDINGS: 1. Atherosclerotic changes of the right common femoral artery without hemodynamically significant stenosis. 2. Perfusion wedge defect in the left temporoparietal region. 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 PROCEDURE: Using biplane roadmap guidance, a 38 Socrates aspiration catheter was navigated over a phenom 21 microcatheter and an Aristotle 14 microguidewire into the cavernous segment of the left ICA. The microcatheter was then navigated over the wire into the left M3/MCA posterior division branch. Multiple attempts necessary to access the occluded M3 branch. Then, the aspiration catheter was  advanced over the microcatheter into the occluded branch and connected to an aspiration pump. The microcatheter was removed. After approximately 1 minutes, the aspiration catheter was removed under constant aspiration. The guide catheter was aspirated for debris. Left internal carotid artery angiograms with frontal and lateral views of the head showed recanalization of the left M3 branch - TICI 2C. Slow flow is seen at the terminal  cortical branches (M4). Flat panel CT of the head was obtained and post processed in a separate workstation with concurrent attending physician supervision. Selected images were sent to PACS. No evidence of hemorrhagic complication. Contrast staining of the basal ganglia and anterior left temporal lobe seen. Right common femoral artery angiogram was obtained in right anterior oblique view. The puncture is at the level of the common femoral artery. The artery has normal caliber, adequate for closure device. The sheath was exchanged over the wire for an 8 [place] Angio-Seal which was utilized for access closure. However, collagen plug herniated through the skin. Manual pressure was then held. Hemostasis was achieved. Patient was extubated. However, due to extreme agitation and combativeness, sedation and re-intubation was necessary. At this point, a groin hematoma was noted and systolic blood pressure dropped to 60 mmHg. Additional manual pressure was held at the access site for approximately 45 minutes. For better blood pressure monitoring, access was gained to the right radial artery using the modified Seldinger technique for arterial line placement. A 20 gauge catheter was utilized. Real-time ultrasound guidance was utilized for vascular access including the acquisition of a permanent ultrasound image documenting patency of the accessed vessel. Patient was then transferred to ICU in stable condition. IMPRESSION: Successful mechanical thrombectomy performed for treatment of a left M3/MCA occlusion achieving complete recanalization after 1 pass with slow flow noted in the distal M4 branches (TICI 2 C). No hemorrhagic complication. PLAN: Transfer to ICU for continued post stroke care. Electronically Signed   By: Baldemar Lenis M.D.   On: 08/12/2022 10:42   IR US Guide Vasc Access Right  Result Date: 08/12/2022 INDICATION: Clover Feehan is an 72 year old female presenting with acute onset of aphasia  and right mid hemianopia; NIHSS 4. Her past medical history significant for arthritis, right basal ganglia stroke, DM2, HLD, HTN, morbid obesity and OSA; premorbid modified Rankin scale 0. Her last known well was 1:00 PM on 08/11/2022. Head CT showed no evidence of an acute infarct or hemorrhage. CT angiogram of the head and neck showed a left M3/MCA posterior division branch occlusion. She comes to our service for mechanical thrombectomy. EXAM: ULTRASOUND-GUIDED VASCULAR ACCESS DIAGNOSTIC CEREBRAL ANGIOGRAM MECHANICAL THROMBECTOMY FLAT PANEL HEAD CT ARTERIAL LINE PLACEMENT COMPARISON:  CT/CT angiogram of the head and neck 08/11/2022. MEDICATIONS: Ancef 3 g IV. ANESTHESIA/SEDATION: The procedure was performed under general anesthesia. CONTRAST:  64 mL of Omnipaque 300 milligram/mL FLUOROSCOPY: Radiation Exposure Index (as provided by the fluoroscopic device): 2,328 mGy Kerma COMPLICATIONS: SIR Level A - No therapy, no consequence. TECHNIQUE: Informed written consent was waved due to altered mental status of patient with no healthcare proxy or next of kin available and emergent nature of procedure. Three physicians agreed that the procedure was necessary. Maximal Sterile Barrier Technique was utilized including caps, mask, sterile gowns, sterile gloves, sterile drape, hand hygiene and skin antiseptic. A timeout was performed prior to the initiation of the procedure. The right groin was prepped and draped in the usual sterile fashion. Using a micropuncture kit and the modified Seldinger technique, access was gained to the right  common femoral artery and an 8 French sheath was placed. Real-time ultrasound guidance was utilized for vascular access including the acquisition of a permanent ultrasound image documenting patency of the accessed vessel. Under fluoroscopy, a Zoom 88 guide catheter was navigated over a 6 [place] Berenstein 2 catheter and a 0.035" Terumo Glidewire into the aortic arch. The catheter was placed  into the left common carotid artery and then advanced into the left internal carotid artery. The diagnostic catheter was removed. Frontal, lateral angiograms and multiple magnified oblique angiograms of the head were obtained. FINDINGS: 1. Atherosclerotic changes of the right common femoral artery without hemodynamically significant stenosis. 2. Perfusion wedge defect in the left temporoparietal region. 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 PROCEDURE: Using biplane roadmap guidance, a 38 Socrates aspiration catheter was navigated over a phenom 21 microcatheter and an Aristotle 14 microguidewire into the cavernous segment of the left ICA. The microcatheter was then navigated over the wire into the left M3/MCA posterior division branch. Multiple attempts necessary to access the occluded M3 branch. Then, the aspiration catheter was advanced over the microcatheter into the occluded branch and connected to an aspiration pump. The microcatheter was removed. After approximately 1 minutes, the aspiration catheter was removed under constant aspiration. The guide catheter was aspirated for debris. Left internal carotid artery angiograms with frontal and lateral views of the head showed recanalization of the left M3 branch - TICI 2C. Slow flow is seen at the terminal cortical branches (M4). Flat panel CT of the head was obtained and post processed in a separate workstation with concurrent attending physician supervision. Selected images were sent to PACS. No evidence of hemorrhagic complication. Contrast staining of the basal ganglia and anterior left temporal lobe seen. Right common femoral artery angiogram was obtained in right anterior oblique view. The puncture is at the level of the common femoral artery. The artery has normal caliber, adequate for closure device. The sheath was exchanged over the wire for an 8 [place] Angio-Seal which was utilized for access closure. However, collagen plug  herniated through the skin. Manual pressure was then held. Hemostasis was achieved. Patient was extubated. However, due to extreme agitation and combativeness, sedation and re-intubation was necessary. At this point, a groin hematoma was noted and systolic blood pressure dropped to 60 mmHg. Additional manual pressure was held at the access site for approximately 45 minutes. For better blood pressure monitoring, access was gained to the right radial artery using the modified Seldinger technique for arterial line placement. A 20 gauge catheter was utilized. Real-time ultrasound guidance was utilized for vascular access including the acquisition of a permanent ultrasound image documenting patency of the accessed vessel. Patient was then transferred to ICU in stable condition. IMPRESSION: Successful mechanical thrombectomy performed for treatment of a left M3/MCA occlusion achieving complete recanalization after 1 pass with slow flow noted in the distal M4 branches (TICI 2 C). No hemorrhagic complication. PLAN: Transfer to ICU for continued post stroke care. Electronically Signed   By: Baldemar Lenis M.D.   On: 08/12/2022 10:42   IR CT Head Ltd  Result Date: 08/12/2022 INDICATION: Angeliyah Kirkey is an 72 year old female presenting with acute onset of aphasia and right mid hemianopia; NIHSS 4. Her past medical history significant for arthritis, right basal ganglia stroke, DM2, HLD, HTN, morbid obesity and OSA; premorbid modified Rankin scale 0. Her last known well was 1:00 PM on 08/11/2022. Head CT showed no evidence of an acute infarct or hemorrhage. CT  angiogram of the head and neck showed a left M3/MCA posterior division branch occlusion. She comes to our service for mechanical thrombectomy. EXAM: ULTRASOUND-GUIDED VASCULAR ACCESS DIAGNOSTIC CEREBRAL ANGIOGRAM MECHANICAL THROMBECTOMY FLAT PANEL HEAD CT ARTERIAL LINE PLACEMENT COMPARISON:  CT/CT angiogram of the head and neck 08/11/2022.  MEDICATIONS: Ancef 3 g IV. ANESTHESIA/SEDATION: The procedure was performed under general anesthesia. CONTRAST:  64 mL of Omnipaque 300 milligram/mL FLUOROSCOPY: Radiation Exposure Index (as provided by the fluoroscopic device): 2,328 mGy Kerma COMPLICATIONS: SIR Level A - No therapy, no consequence. TECHNIQUE: Informed written consent was waved due to altered mental status of patient with no healthcare proxy or next of kin available and emergent nature of procedure. Three physicians agreed that the procedure was necessary. Maximal Sterile Barrier Technique was utilized including caps, mask, sterile gowns, sterile gloves, sterile drape, hand hygiene and skin antiseptic. A timeout was performed prior to the initiation of the procedure. The right groin was prepped and draped in the usual sterile fashion. Using a micropuncture kit and the modified Seldinger technique, access was gained to the right common femoral artery and an 8 French sheath was placed. Real-time ultrasound guidance was utilized for vascular access including the acquisition of a permanent ultrasound image documenting patency of the accessed vessel. Under fluoroscopy, a Zoom 88 guide catheter was navigated over a 6 [place] Berenstein 2 catheter and a 0.035" Terumo Glidewire into the aortic arch. The catheter was placed into the left common carotid artery and then advanced into the left internal carotid artery. The diagnostic catheter was removed. Frontal, lateral angiograms and multiple magnified oblique angiograms of the head were obtained. FINDINGS: 1. Atherosclerotic changes of the right common femoral artery without hemodynamically significant stenosis. 2. Perfusion wedge defect in the left temporoparietal region. 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 PROCEDURE: Using biplane roadmap guidance, a 38 Socrates aspiration catheter was navigated over a phenom 21 microcatheter and an Aristotle 14 microguidewire into the  cavernous segment of the left ICA. The microcatheter was then navigated over the wire into the left M3/MCA posterior division branch. Multiple attempts necessary to access the occluded M3 branch. Then, the aspiration catheter was advanced over the microcatheter into the occluded branch and connected to an aspiration pump. The microcatheter was removed. After approximately 1 minutes, the aspiration catheter was removed under constant aspiration. The guide catheter was aspirated for debris. Left internal carotid artery angiograms with frontal and lateral views of the head showed recanalization of the left M3 branch - TICI 2C. Slow flow is seen at the terminal cortical branches (M4). Flat panel CT of the head was obtained and post processed in a separate workstation with concurrent attending physician supervision. Selected images were sent to PACS. No evidence of hemorrhagic complication. Contrast staining of the basal ganglia and anterior left temporal lobe seen. Right common femoral artery angiogram was obtained in right anterior oblique view. The puncture is at the level of the common femoral artery. The artery has normal caliber, adequate for closure device. The sheath was exchanged over the wire for an 8 [place] Angio-Seal which was utilized for access closure. However, collagen plug herniated through the skin. Manual pressure was then held. Hemostasis was achieved. Patient was extubated. However, due to extreme agitation and combativeness, sedation and re-intubation was necessary. At this point, a groin hematoma was noted and systolic blood pressure dropped to 60 mmHg. Additional manual pressure was held at the access site for approximately 45 minutes. For better blood pressure monitoring, access  was gained to the right radial artery using the modified Seldinger technique for arterial line placement. A 20 gauge catheter was utilized. Real-time ultrasound guidance was utilized for vascular access including the  acquisition of a permanent ultrasound image documenting patency of the accessed vessel. Patient was then transferred to ICU in stable condition. IMPRESSION: Successful mechanical thrombectomy performed for treatment of a left M3/MCA occlusion achieving complete recanalization after 1 pass with slow flow noted in the distal M4 branches (TICI 2 C). No hemorrhagic complication. PLAN: Transfer to ICU for continued post stroke care. Electronically Signed   By: Baldemar Lenis M.D.   On: 08/12/2022 10:42   IR US Guide Vasc Access Right  Result Date: 08/12/2022 INDICATION: Aizley Stenseth is an 72 year old female presenting with acute onset of aphasia and right mid hemianopia; NIHSS 4. Her past medical history significant for arthritis, right basal ganglia stroke, DM2, HLD, HTN, morbid obesity and OSA; premorbid modified Rankin scale 0. Her last known well was 1:00 PM on 08/11/2022. Head CT showed no evidence of an acute infarct or hemorrhage. CT angiogram of the head and neck showed a left M3/MCA posterior division branch occlusion. She comes to our service for mechanical thrombectomy. EXAM: ULTRASOUND-GUIDED VASCULAR ACCESS DIAGNOSTIC CEREBRAL ANGIOGRAM MECHANICAL THROMBECTOMY FLAT PANEL HEAD CT ARTERIAL LINE PLACEMENT COMPARISON:  CT/CT angiogram of the head and neck 08/11/2022. MEDICATIONS: Ancef 3 g IV. ANESTHESIA/SEDATION: The procedure was performed under general anesthesia. CONTRAST:  64 mL of Omnipaque 300 milligram/mL FLUOROSCOPY: Radiation Exposure Index (as provided by the fluoroscopic device): 2,328 mGy Kerma COMPLICATIONS: SIR Level A - No therapy, no consequence. TECHNIQUE: Informed written consent was waved due to altered mental status of patient with no healthcare proxy or next of kin available and emergent nature of procedure. Three physicians agreed that the procedure was necessary. Maximal Sterile Barrier Technique was utilized including caps, mask, sterile gowns, sterile gloves,  sterile drape, hand hygiene and skin antiseptic. A timeout was performed prior to the initiation of the procedure. The right groin was prepped and draped in the usual sterile fashion. Using a micropuncture kit and the modified Seldinger technique, access was gained to the right common femoral artery and an 8 French sheath was placed. Real-time ultrasound guidance was utilized for vascular access including the acquisition of a permanent ultrasound image documenting patency of the accessed vessel. Under fluoroscopy, a Zoom 88 guide catheter was navigated over a 6 [place] Berenstein 2 catheter and a 0.035" Terumo Glidewire into the aortic arch. The catheter was placed into the left common carotid artery and then advanced into the left internal carotid artery. The diagnostic catheter was removed. Frontal, lateral angiograms and multiple magnified oblique angiograms of the head were obtained. FINDINGS: 1. Atherosclerotic changes of the right common femoral artery without hemodynamically significant stenosis. 2. Perfusion wedge defect in the left temporoparietal region. 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 PROCEDURE: Using biplane roadmap guidance, a 38 Socrates aspiration catheter was navigated over a phenom 21 microcatheter and an Aristotle 14 microguidewire into the cavernous segment of the left ICA. The microcatheter was then navigated over the wire into the left M3/MCA posterior division branch. Multiple attempts necessary to access the occluded M3 branch. Then, the aspiration catheter was advanced over the microcatheter into the occluded branch and connected to an aspiration pump. The microcatheter was removed. After approximately 1 minutes, the aspiration catheter was removed under constant aspiration. The guide catheter was aspirated for debris. Left internal carotid artery angiograms  with frontal and lateral views of the head showed recanalization of the left M3 branch - TICI 2C. Slow  flow is seen at the terminal cortical branches (M4). Flat panel CT of the head was obtained and post processed in a separate workstation with concurrent attending physician supervision. Selected images were sent to PACS. No evidence of hemorrhagic complication. Contrast staining of the basal ganglia and anterior left temporal lobe seen. Right common femoral artery angiogram was obtained in right anterior oblique view. The puncture is at the level of the common femoral artery. The artery has normal caliber, adequate for closure device. The sheath was exchanged over the wire for an 8 [place] Angio-Seal which was utilized for access closure. However, collagen plug herniated through the skin. Manual pressure was then held. Hemostasis was achieved. Patient was extubated. However, due to extreme agitation and combativeness, sedation and re-intubation was necessary. At this point, a groin hematoma was noted and systolic blood pressure dropped to 60 mmHg. Additional manual pressure was held at the access site for approximately 45 minutes. For better blood pressure monitoring, access was gained to the right radial artery using the modified Seldinger technique for arterial line placement. A 20 gauge catheter was utilized. Real-time ultrasound guidance was utilized for vascular access including the acquisition of a permanent ultrasound image documenting patency of the accessed vessel. Patient was then transferred to ICU in stable condition. IMPRESSION: Successful mechanical thrombectomy performed for treatment of a left M3/MCA occlusion achieving complete recanalization after 1 pass with slow flow noted in the distal M4 branches (TICI 2 C). No hemorrhagic complication. PLAN: Transfer to ICU for continued post stroke care. Electronically Signed   By: Baldemar Lenis M.D.   On: 08/12/2022 10:42   MR BRAIN WO CONTRAST  Result Date: 08/12/2022 CLINICAL DATA:  72 year old female code stroke presentation yesterday  with left MCA branch occlusion status post endovascular revascularization. EXAM: MRI HEAD WITHOUT CONTRAST MRA HEAD WITHOUT CONTRAST TECHNIQUE: Multiplanar, multi-echo pulse sequences of the brain and surrounding structures were acquired without intravenous contrast. Angiographic images of the Circle of Willis were acquired using MRA technique without intravenous contrast. COMPARISON:  CTA head and neck yesterday. Previous brain MRI 10/06/2017. FINDINGS: MRI HEAD FINDINGS Brain: Punctate diffusion restriction in the left caudate nucleus suspected on series 5, image 74. And mild scattered posterior left MCA, left MCA/PCA watershed cortical and white matter diffusion restriction (series 5, images 72 and 75). Mild associated cytotoxic edema. No hemorrhagic transformation; underlying chronic microhemorrhage in the left occipital lobe white matter series 16, image 28 on SWI. No contralateral right hemisphere or posterior fossa restricted diffusion. 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 Chronic right basal ganglia lacunar infarcts are new since that time. Widely scattered patchy bilateral cerebral white matter T2 and FLAIR hyperintensity otherwise not significantly changed. No midline shift, mass effect, evidence of mass lesion, ventriculomegaly, extra-axial collection or acute intracranial hemorrhage. Cervicomedullary junction and pituitary are within normal limits. Vascular: Major intracranial vascular flow voids appear stable. Skull and upper cervical spine: Negative for age visible cervical spine. Visualized bone marrow signal is within normal limits. Sinuses/Orbits: Negative orbits. Paranasal sinus mucosal thickening and fluid in the setting of intubation. Other: Fluid in the nasal cavity and nasopharynx in the setting of intubation. Mastoids remain well aerated. MRA HEAD FINDINGS Anterior circulation: Antegrade flow in both ICA siphons. Left greater than right ICA siphon irregularity. Patent  carotid termini, MCA and right ACA origins. Left ACA A1 irregularity and stenosis  is stable on series 1065, image 11. Other visible ACA branches are within normal limits. Right MCA M1 segment and visible right MCA branches are stable, including moderate posterior division irregularity and stenosis on series 1071, image 15). Left MCA M1 segment remains patent with mild distal M1 stenosis (series 1071, image 10). Left MCA branches appear more numerous compared to the CTA yesterday. There is residual moderate to severe tandem a regularity and stenosis in the most posterior division on series 1071, image 6. Posterior circulation: Antegrade flow in the posterior circulation. Dominant distal left vertebral artery, vertebrobasilar junction and basilar artery appear stable. Bilateral PICA, SCA and PCA origins remain patent. Posterior communicating arteries are diminutive or absent. Bilateral PCA branches appear stable. Anatomic variants: Dominant left vertebral artery. IMPRESSION: 1. Intracranial atherosclerosis. Improved appearance of Left MCA posterior division branches following endovascular reperfusion. 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 2. Mild, scattered small acute infarcts in the Left MCA and Left MCA/PCA watershed territories. No hemorrhagic transformation or mass effect. 3. No new intracranial abnormality. 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 Electronically Signed   By: Odessa Fleming M.D.   On: 08/12/2022 10:24   MR ANGIO HEAD WO CONTRAST  Result Date: 08/12/2022 CLINICAL DATA:  72 year old female code stroke presentation yesterday with left MCA branch occlusion status post endovascular revascularization. EXAM: MRI HEAD WITHOUT CONTRAST MRA HEAD WITHOUT CONTRAST TECHNIQUE: Multiplanar, multi-echo pulse sequences of the brain and surrounding structures were acquired without intravenous  contrast. Angiographic images of the Circle of Willis were acquired using MRA technique without intravenous contrast. COMPARISON:  CTA head and neck yesterday. Previous brain MRI 10/06/2017. FINDINGS: MRI HEAD FINDINGS Brain: Punctate diffusion restriction in the left caudate nucleus suspected on series 5, image 74. And mild scattered posterior left MCA, left MCA/PCA watershed cortical and white matter diffusion restriction (series 5, images 72 and 75). Mild associated cytotoxic edema. No hemorrhagic transformation; underlying chronic microhemorrhage in the left occipital lobe white matter series 16, image 28 on SWI. No contralateral right hemisphere or posterior fossa restricted diffusion. 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 Chronic right basal ganglia lacunar infarcts are new since that time. Widely scattered patchy bilateral cerebral white matter T2 and FLAIR hyperintensity otherwise not significantly changed. No midline shift, mass effect, evidence of mass lesion, ventriculomegaly, extra-axial collection or acute intracranial hemorrhage. Cervicomedullary junction and pituitary are within normal limits. Vascular: Major intracranial vascular flow voids appear stable. Skull and upper cervical spine: Negative for age visible cervical spine. Visualized bone marrow signal is within normal limits. Sinuses/Orbits: Negative orbits. Paranasal sinus mucosal thickening and fluid in the setting of intubation. Other: Fluid in the nasal cavity and nasopharynx in the setting of intubation. Mastoids remain well aerated. MRA HEAD FINDINGS Anterior circulation: Antegrade flow in both ICA siphons. Left greater than right ICA siphon irregularity. Patent carotid termini, MCA and right ACA origins. Left ACA A1 irregularity and stenosis is stable on series 1065, image 11. Other visible ACA branches are within normal limits. Right MCA M1 segment and visible right MCA branches are stable, including moderate  posterior division irregularity and stenosis on series 1071, image 15). Left MCA M1 segment remains patent with mild distal M1 stenosis (series 1071, image 10). Left MCA branches appear more numerous compared to the CTA yesterday. There is residual moderate to severe tandem a regularity and stenosis in the most posterior division on series 1071,  image 6. Posterior circulation: Antegrade flow in the posterior circulation. Dominant distal left vertebral artery, vertebrobasilar junction and basilar artery appear stable. Bilateral PICA, SCA and PCA origins remain patent. Posterior communicating arteries are diminutive or absent. Bilateral PCA branches appear stable. Anatomic variants: Dominant left vertebral artery. IMPRESSION: 1. Intracranial atherosclerosis. Improved appearance of Left MCA posterior division branches following endovascular reperfusion. 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 2. Mild, scattered small acute infarcts in the Left MCA and Left MCA/PCA watershed territories. No hemorrhagic transformation or mass effect. 3. No new intracranial abnormality. 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 Electronically Signed   By: Odessa Fleming M.D.   On: 08/12/2022 10:24   ARTERIAL PSEUDOANEURYSM COMPRESSION  Result Date: 08/12/2022  ARTERIAL PSEUDOANEURYSM  Patient Name:  KAYLEEANN HUXFORD  Date of Exam:   08/12/2022 Medical Rec #: [id number]        Accession #:    [id number] Date of Birth: Apr 16, 1950        Patient Gender: F Patient Age:   79 years Exam Location:  [facility] Procedure:      VAS [place] LOWER EXT ARTERIAL PSEUDOANEURYSM COMPRESSION Referring Phys: Jerilynn Mages DE MACEDO RODRIGUES --------------------------------------------------------------------------------  Exam: Right groin Indications: Patient complains of bruising and hematoma. History: S/p cerebral angiogram and thrombectomy 6/26.  Comparison Study: No prior study Performing Technologist: Sherren Kerns RVS  Examination Guidelines: A complete evaluation includes B-mode imaging, spectral Doppler, color Doppler, and power Doppler as needed of all accessible portions of each vessel. Bilateral testing is considered an integral part of a complete examination. Limited examinations for reoccurring indications may be performed as noted.  Summary: No evidence of pseudoaneurysm, AVF or DVT    --------------------------------------------------------------------------------    Preliminary    Portable Chest x-ray  Result Date: 08/12/2022 CLINICAL DATA:  [id number] Endotracheal tube present [id number] EXAM: PORTABLE CHEST 1 VIEW COMPARISON:  Chest x-ray 07/08/2022, CT chest 04/23/2016 FINDINGS: Interval placement endotracheal tube with tip terminating 3 cm above the carina. Enteric tube coursing below the hemidiaphragm with tip and side port collimated off view. Enlarged cardiac silhouette. The heart and mediastinal contours are unchanged. Question retrocardiac airspace opacity. No pulmonary edema. No pleural effusion. No pneumothorax. No acute osseous abnormality. IMPRESSION: 1. Question retrocardiac airspace opacity. 2. Lines and tubes as above. Electronically Signed   By: Tish Frederickson M.D.   On: 08/12/2022 01:35   CT ANGIO HEAD NECK W WO CM W PERF (CODE STROKE)  Result Date: 08/11/2022 CLINICAL DATA:  Initial evaluation for neuro deficit, stroke. EXAM: CT ANGIOGRAPHY HEAD AND NECK CT PERFUSION BRAIN TECHNIQUE: Multidetector CT imaging of the head and neck was performed using the standard protocol during bolus administration of intravenous contrast. Multiplanar CT image reconstructions and MIPs were obtained to evaluate the vascular anatomy. Carotid stenosis measurements (when applicable) are obtained utilizing NASCET criteria, using the distal internal carotid diameter as the denominator. Multiphase CT imaging of the brain was performed following IV  bolus contrast injection. Subsequent parametric perfusion maps were calculated using RAPID software. RADIATION DOSE REDUCTION: This exam was performed according to the departmental dose-optimization program which includes automated exposure control, adjustment of the mA and/or kV according to patient size and/or use of iterative reconstruction technique. CONTRAST:  75mL OMNIPAQUE IOHEXOL 350 MG/ML SOLN COMPARISON:  Prior CT from earlier the same day. FINDINGS: CTA NECK FINDINGS Aortic arch: Visualized arch normal in caliber with standard 3 vessel morphology. Moderate  atheromatous change about the arch itself. No significant stenosis about the origin the great vessels. Right carotid system: Right common and internal carotid arteries are patent without dissection. Bulky calcified plaque about the right carotid bulb/proximal right ICA associated stenosis of up to 50% by NASCET criteria. Left carotid system: Left common and internal carotid arteries are patent without evidence for dissection. Bulky calcified plaque about the left carotid bulb/proximal left ICA. Associated stenosis of up to 75% by NASCET criteria (series 7, image 191). Vertebral arteries: Both vertebral arteries arise from subclavian arteries. No significant proximal subclavian artery stenosis. Atheromatous plaque at the origin of the left vertebral artery with associated moderate ostial stenosis. Additional scattered atheromatous change within the distal left V3 segment with moderate diffuse narrowing (series 7, image 162). The contralateral right vertebral artery is largely occluded at its origin. Scant attenuated distal reconstitution at the right V2 segment via collaterals. Right vertebral artery is attenuated but patent distally to the skull base. No dissection. Skeleton: No discrete or worrisome osseous lesions. Anterior endplate degenerative spurring noted throughout the cervical spine. Other neck: No other acute soft tissue abnormality within the  neck. Few thyroid nodules noted, largest of which measures 1.9 cm on the right. These have been previously evaluated by thyroid ultrasound on 05/11/2016 (ref: J Am Coll Radiol. 2015 Feb;12(2): 143-50). Upper chest: Mild emphysematous changes. 4 mm right upper lobe nodule (series 8, image 169), indeterminate. No other acute finding. Review of the MIP images confirms the above findings CTA HEAD FINDINGS Anterior circulation: Atheromatous change seen about the carotid siphons with associated moderate multifocal narrowing on the left and more mild narrowing on the right. A1 segments patent bilaterally. Left A1 hypoplastic. Normal anterior communicating complex. Anterior cerebral arteries patent without significant stenosis. No M1 stenosis or occlusion. Distal right MCA branches are patent and perfused. On the left, there is an acute stump occlusion of a proximal left M3 branch, inferior division (series 7, image 106). Remainder of the left MCA branches remain patent and perfused. Distal small vessel atheromatous irregularity noted. Posterior circulation: Atheromatous change within the dominant proximal left V4 segment with mild stenosis. Diminutive right V4 segment attenuated but patent without stenosis. Both PICA patent. Basilar irregular but patent without significant stenosis. Superior cerebral arteries patent bilaterally. Both PCAs primarily supplied via the basilar. Atheromatous irregularity about the PCAs bilaterally with associated short-segment moderate left P2 stenosis (series 11, image 23). PCAs otherwise patent. Venous sinuses: Grossly patent allowing for timing the contrast bolus. Anatomic variants: As above.  No aneurysm. Review of the MIP images confirms the above findings CT Brain Perfusion Findings: ASPECTS: 10 CBF (<30%) Volume: 0mL Perfusion (Tmax>6.0s) volume: 23mL Mismatch Volume: 23mL Infarction Location:Negative CT perfusion for acute core infarct. 23 mL perfusion deficit at the posterior left MCA  distribution, in keeping with the above described left M3 occlusion. IMPRESSION: 1. Acute left M3 stump occlusion, inferior division. 2. Negative CT perfusion for acute core infarct. 23 mL perfusion deficit at the posterior left MCA distribution, in keeping with the above described left M3 occlusion. 3. Bulky calcified plaque about the carotid bifurcations/proximal ICAs with associated stenoses of up to 75% on the left and 50% on the right. 4. Occlusion of the right vertebral artery at its origin. Attenuated distal reconstitution at the right V2 segment via collaterals. Atheromatous change about the dominant left V4 segment with associated moderate stenoses at its origin and about the left V3 segment. 5. Atheromatous change about the carotid siphons with associated moderate multifocal  narrowing, left worse than right. 6. Short-segment moderate left P2 stenosis. 7. 4 mm right upper lobe nodule, indeterminate. Per Fleischner Society Guidelines, a non-contrast Chest CT at 12 months is optional. If performed and the nodule is stable at 12 months, no further follow-up is recommended. These guidelines do not apply to immunocompromised patients and patients with cancer. Follow up in patients with significant comorbidities as clinically warranted. For lung cancer screening, adhere to Lung-RADS guidelines. Reference: Radiology. 2017; 284(1):228-43. Aortic Atherosclerosis (ICD10-I70.0) and Emphysema (ICD10-J43.9). Critical Value/emergent results were called by telephone at the time of interpretation on 08/11/2022 at 8:42 pm to provider ERIC [facility] , who verbally acknowledged these results. Electronically Signed   By: Rise Mu M.D.   On: 08/11/2022 21:17   CT HEAD CODE STROKE WO CONTRAST  Result Date: 08/11/2022 CLINICAL DATA:  Code stroke. Initial evaluation for neuro deficit, stroke. EXAM: CT HEAD WITHOUT CONTRAST TECHNIQUE: Contiguous axial images were obtained from the base of the skull through the vertex  without intravenous contrast. RADIATION DOSE REDUCTION: This exam was performed according to the departmental dose-optimization program which includes automated exposure control, adjustment of the mA and/or kV according to patient size and/or use of iterative reconstruction technique. COMPARISON:  Prior exam from 07/08/2022. FINDINGS: Brain: Age-related cerebral atrophy with moderate chronic microvascular ischemic disease. Remote right cerebellar infarct. Few small remote lacunar infarcts present about the basal adult basal bilateral basal ganglia. No acute intracranial hemorrhage. No acute large vessel territory infarct. No mass lesion, midline shift or mass effect. No hydrocephalus or extra-axial fluid collection. Vascular: No abnormal hyperdense vessel. Calcified atherosclerosis present at the skull base. Skull: Scalp soft tissues and calvarium demonstrate no acute finding. Sinuses/Orbits: Globes and orbits soft tissues within normal limits. Paranasal sinuses are clear. Trace left mastoid effusion, of doubtful significance. Other: None. ASPECTS [facility] Stroke Program Early CT Score) - Ganglionic level infarction (caudate, lentiform nuclei, internal capsule, insula, M1-M3 cortex): 7 - Supraganglionic infarction (M4-M6 cortex): 3 Total score (0-10 with 10 being normal): 10 IMPRESSION: 1. No acute intracranial abnormality. 2. ASPECTS is 10. 3. Age-related cerebral atrophy with moderate chronic small vessel ischemic disease. Small remote right cerebellar infarct. These results were communicated to Dr. Otelia Limes at 8:20 pm on 08/11/2022 by text page via the [facility] messaging system. Electronically Signed   By: Rise Mu M.D.   On: 08/11/2022 20:21    Labs:  CBC: Recent Labs    07/08/22 1200 08/11/22 2000 08/11/22 2004 08/11/22 2207 08/12/22 0045 08/12/22 0223 08/12/22 0647  WBC 4.0 4.6  --   --   --   --  8.6  HGB 12.3 14.3   < > 12.1 11.6* 12.0 12.0  HCT 39.3 46.1*   < > 39.7 34.0* 37.6 38.0   PLT 195 208  --   --   --   --  244   < > = values in this interval not displayed.    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 Mechanical thrombectomy performed with direct contact aspiration with complete recanalization after one pass.  IR will follow with Stroke team  Electronically Signed: Robet Leu, PA-C 08/12/2022, 11:13 AM   I spent a total of 15 Minutes at the the patient's bedside AND on the patient's hospital floor or unit, greater than 50% of which was counseling/coordinating care for L MCA revascularization

## 2022-08-13 DIAGNOSIS — R579 Shock, unspecified: Secondary | ICD-10-CM

## 2022-08-13 LAB — CBC
HCT: 33.2 % — ABNORMAL LOW (ref 36.0–46.0)
Hemoglobin: 10.4 g/dL — ABNORMAL LOW (ref 12.0–15.0)
MCH: 26.1 pg (ref 26.0–34.0)
MCHC: 31.3 g/dL (ref 30.0–36.0)
MCV: 83.4 fL (ref 80.0–100.0)
Platelets: 193 10*3/uL (ref 150–400)
RBC: 3.98 MIL/uL (ref 3.87–5.11)
RDW: 16.4 % — ABNORMAL HIGH (ref 11.5–15.5)
WBC: 12 10*3/uL — ABNORMAL HIGH (ref 4.0–10.5)
nRBC: 0 % (ref 0.0–0.2)

## 2022-08-13 LAB — RENAL FUNCTION PANEL
Albumin: 3 g/dL — ABNORMAL LOW (ref 3.5–5.0)
Anion gap: 7 (ref 5–15)
BUN: 8 mg/dL (ref 8–23)
CO2: 22 mmol/L (ref 22–32)
Calcium: 8.6 mg/dL — ABNORMAL LOW (ref 8.9–10.3)
Chloride: 113 mmol/L — ABNORMAL HIGH (ref 98–111)
Creatinine, Ser: 0.91 mg/dL (ref 0.44–1.00)
GFR, Estimated: >60 mL/min (ref >60)
Glucose, Bld: 116 mg/dL — ABNORMAL HIGH (ref 70–99)
Phosphorus: 3.2 mg/dL (ref 2.5–4.6)
Potassium: 4 mmol/L (ref 3.5–5.1)
Sodium: 142 mmol/L (ref 135–145)

## 2022-08-13 LAB — URINE CULTURE: Culture: <10000 — AB

## 2022-08-13 LAB — GLUCOSE, CAPILLARY
Glucose-Capillary: 112 mg/dL — ABNORMAL HIGH (ref 70–99)
Glucose-Capillary: 67 mg/dL — ABNORMAL LOW (ref 70–99)
Glucose-Capillary: 79 mg/dL (ref 70–99)
Glucose-Capillary: 86 mg/dL (ref 70–99)
Glucose-Capillary: 89 mg/dL (ref 70–99)
Glucose-Capillary: 91 mg/dL (ref 70–99)
Glucose-Capillary: 98 mg/dL (ref 70–99)

## 2022-08-13 LAB — HEMOGLOBIN A1C
Hgb A1c MFr Bld: 5.8 % — ABNORMAL HIGH (ref 4.8–5.6)
Mean Plasma Glucose: 120 mg/dL

## 2022-08-13 LAB — MAGNESIUM: Magnesium: 1.8 mg/dL (ref 1.7–2.4)

## 2022-08-13 MED ORDER — DEXTROSE 50 % IV SOLN
INTRAVENOUS | Status: AC
  Filled 2022-08-13: qty 50

## 2022-08-13 MED ORDER — CLOPIDOGREL BISULFATE 75 MG PO TABS: 75.0000 mg | ORAL_TABLET | Freq: Every day | ORAL | Status: DC

## 2022-08-13 MED ORDER — ENOXAPARIN SODIUM 40 MG/0.4ML IJ SOSY
40.0000 mg | PREFILLED_SYRINGE | INTRAMUSCULAR | Status: DC
  Administered 2022-08-13 – 2022-08-14 (×2): 40 mg via SUBCUTANEOUS
  Filled 2022-08-13 (×2): qty 0.4

## 2022-08-13 MED ORDER — FAMOTIDINE 20 MG PO TABS
20.0000 mg | ORAL_TABLET | Freq: Two times a day (BID) | ORAL | Status: DC
  Administered 2022-08-13: 20 mg via ORAL
  Filled 2022-08-13: qty 1

## 2022-08-13 MED ORDER — ROSUVASTATIN CALCIUM 20 MG PO TABS: 20.0000 mg | ORAL_TABLET | Freq: Every day | ORAL | Status: DC

## 2022-08-13 MED ORDER — CLOPIDOGREL BISULFATE 75 MG PO TABS
75.0000 mg | ORAL_TABLET | Freq: Every day | ORAL | Status: DC
  Administered 2022-08-13 – 2022-08-15 (×3): 75 mg via ORAL
  Filled 2022-08-13 (×3): qty 1

## 2022-08-13 MED ORDER — ASPIRIN 81 MG PO CHEW: 81.0000 mg | CHEWABLE_TABLET | Freq: Every day | ORAL | Status: DC

## 2022-08-13 MED ORDER — ASPIRIN 81 MG PO CHEW
81.0000 mg | CHEWABLE_TABLET | Freq: Every day | ORAL | Status: DC
  Administered 2022-08-13 – 2022-08-15 (×3): 81 mg via ORAL
  Filled 2022-08-13 (×3): qty 1

## 2022-08-13 MED ORDER — ROSUVASTATIN CALCIUM 20 MG PO TABS
20.0000 mg | ORAL_TABLET | Freq: Every day | ORAL | Status: DC
  Administered 2022-08-13 – 2022-08-15 (×3): 20 mg via ORAL
  Filled 2022-08-13 (×3): qty 1

## 2022-08-13 MED ORDER — DEXTROSE 50 % IV SOLN
12.5000 g | INTRAVENOUS | Status: AC
  Administered 2022-08-13: 12.5 g via INTRAVENOUS

## 2022-08-13 MED ORDER — DOCUSATE SODIUM 50 MG/5ML PO LIQD: 100.0000 mg | Freq: Two times a day (BID) | ORAL | Status: DC

## 2022-08-13 MED ORDER — MIDODRINE HCL 5 MG PO TABS
5.0000 mg | ORAL_TABLET | Freq: Three times a day (TID) | ORAL | Status: DC
  Administered 2022-08-13 (×2): 5 mg via ORAL
  Filled 2022-08-13 (×2): qty 1

## 2022-08-13 MED ORDER — POLYETHYLENE GLYCOL 3350 17 G PO PACK: 17.0000 g | PACK | Freq: Every day | ORAL | Status: DC

## 2022-08-13 MED ORDER — FAMOTIDINE 20 MG PO TABS
20.0000 mg | ORAL_TABLET | Freq: Two times a day (BID) | ORAL | Status: DC
Start: 2022-08-13 — End: 2022-08-13

## 2022-08-13 NOTE — Inpatient Diabetes Management (Signed)
Inpatient Diabetes Program Recommendations  AACE/ADA: New Consensus Statement on Inpatient Glycemic Control (2015)  Target Ranges:  Prepandial:   less than 140 mg/dL      Peak postprandial:   less than 180 mg/dL (1-2 hours)      Critically ill patients:  140 - 180 mg/dL   Lab Results  Component Value Date   GLUCAP 86 08/13/2022   HGBA1C 5.8 (H) 08/11/2022    Review of Glycemic Control  Latest Reference Range & Units 08/12/22 07:41 08/12/22 11:14 08/12/22 15:16 08/12/22 19:43 08/12/22 23:26 08/13/22 03:28 08/13/22 04:28 08/13/22 07:30  Glucose-Capillary 70 - 99 mg/dL 161 (H) 096 (H) 045 (H) 126 (H) 81 67 (L) 112 (H) 86   Diabetes history: None Current orders for Inpatient glycemic control:  Novolog 0-20 units Q4 hours  Note hypoglycemia of 67  Inpatient Diabetes Program Recommendations:    -   reduce Novolog Correction to 0-15 units and change to tid + hs scale since diet is ordered  Thanks,  Christena Deem RN, MSN, BC-ADM Inpatient Diabetes Coordinator Team Pager [phone] (8a-5p)

## 2022-08-13 NOTE — Evaluation (Signed)
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   Protein-calorie malnutrition (HCC) 11/15/2014   Osteomyelitis of right knee region (HCC) 11/08/2014   Complication of device 11/04/2014   Septic arthritis (HCC) 11/04/2014   S/P right knee arthroscopy    UTI (urinary tract infection) 12/09/2012   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   Hyperlipidemia    Hypertension    Past Medical History:  Past Medical History:  Diagnosis Date   Arthritis    "right knee" (07/02/2015)   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   Diabetes (HCC)    type II   Hyperlipidemia    Hypertension    Obesity    OSA (obstructive sleep apnea)    Overactive bladder    S/P right knee arthroscopy    with septic joint growing out MSSA   Septic arthritis of knee, right (HCC)    Wears glasses    Past Surgical History:  Past Surgical History:  Procedure Laterality Date   BIOPSY  08/05/2020   Procedure: BIOPSY;  Surgeon: Kathi Der, MD;  Location: [facility];  Service: Gastroenterology;;   COLONOSCOPY     COLONOSCOPY WITH PROPOFOL N/A 08/05/2020   Procedure: COLONOSCOPY WITH PROPOFOL;  Surgeon: Kathi Der, MD;  Location: [facility];  Service: Gastroenterology;  Laterality: N/A;   DIAGNOSTIC LAPAROSCOPY     IR CT HEAD LTD  08/12/2022   IR PERCUTANEOUS ART THROMBECTOMY/INFUSION INTRACRANIAL INC DIAG ANGIO  08/12/2022   IR US GUIDE VASC  ACCESS RIGHT  08/12/2022   IR US GUIDE VASC ACCESS RIGHT  08/12/2022   JOINT REPLACEMENT     KNEE ARTHROSCOPY Right 12/09/2012   Procedure: ARTHROSCOPIC IRRIGATION AND DEBRIDEMENT RIGHT KNEE;  Surgeon: Budd Palmer, MD;  Location: [facility] OR;  Service: Orthopedics;  Laterality: Right;   LEFT HEART CATH AND CORONARY ANGIOGRAPHY N/A 09/02/2016   Procedure: Left Heart Cath and Coronary Angiography;  Surgeon: Rinaldo Cloud, MD;  Location: [facility] INVASIVE CV LAB;  Service: Cardiovascular;  Laterality: N/A;   RADIOLOGY WITH ANESTHESIA N/A 08/11/2022   Procedure: IR WITH ANESTHESIA;  Surgeon: Julieanne Cotton, MD;  Location: [facility] OR;  Service: Radiology;  Laterality: N/A;   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/06/2014   Procedure: PLACEMENT OF CEMENT SPACER;  Surgeon: Loreta Ave, MD;  Location: [facility] OR;  Service: Orthopedics;  Laterality: Right;   TOTAL KNEE ARTHROPLASTY Bilateral 2011-2012   left-right   TOTAL KNEE REVISION Right 11/06/2014   Procedure: REMOVAL OF TOTAL COMPONENTS, EXTENSIVE  IRRIGATION AND DEBRIDEMENT  RIGHT KNEE;  Surgeon: Loreta Ave, MD;  Location: [facility] OR;  Service: Orthopedics;  Laterality: Right;   TOTAL KNEE REVISION Right 07/02/2015   TOTAL KNEE REVISION Right 07/02/2015   Procedure: TOTAL KNEE REVISION;  Surgeon: Loreta Ave, MD;  Location: [facility] OR;  Service: Orthopedics;  Laterality: Right;   TUBAL LIGATION  ~ 1980   tummy tuck  1980s   HPI:  Pt is a 72 y.o.  female who presented secondary to acute onset of garbled speech. CTA 6/26: Acute left M3 stump occlusion, inferior division; pt s/p mechanical thrombectomy 6/26. ETT 6/26-6/27. Pt failed the [facility] since she stopped drinking. CT head 6/26 negative for acute changes; MRI brain 6/27: mild, scattered small acute infarcts in the Left MCA and Left  MCA/PCA watershed territories. PMH: arthritis, right basal ganglia stroke, DM2, HLD, HTN, obesity and OSA.   Assessment / Plan / Recommendation Clinical Impression  Pt was  seen for speech-language evaluation. Pt reported that she lives with her daughter and was independent with medication and financial management prior to admission. Pt denied any baseline deficits in speech, language, or cognition, but stated that she is now having difficulty with word retrieval. Pt presented with mild-moderate non-fluent aphasia characterized by impairments in both receptive and expressive language, but with more difficulty with verbal expression. Pt was able to follow most 2-step commands and accurately respond to simple yes/no questions, but she exhibited difficulty with auditory comprehension of complex yes/no question and with reading comprehension at the sentence level. Pt communicated at the conversational level with intermittent word retrieval difficulty. Phonemic paraphasias and neologistic errors were observed during structured naming tasks and during conversation, and pt was inconsistently able to self-correct. Pt's cognition could not be reliably assessed due to the nature of pt's linguistic errors. Motor speech skills were WFL. Skilled SLP services are clinically indicated at this time to improve language.    SLP Assessment  SLP Recommendation/Assessment: Patient needs continued Speech Lanaguage Pathology Services SLP Visit Diagnosis: Aphasia (R47.01)    Recommendations for follow up therapy are one component of a multi-disciplinary discharge planning process, led by the attending physician.  Recommendations may be updated based on patient status, additional functional criteria and insurance authorization.    Follow Up Recommendations   (Continued SLP services at level of care recommended by PT/OT)    Assistance Recommended at Discharge  Intermittent Supervision/Assistance  Functional Status Assessment Patient has had a recent decline in their functional status and demonstrates the ability to make significant improvements in function in a reasonable and predictable amount of  time.  Frequency and Duration min 2x/week  2 weeks      SLP Evaluation Cognition  Overall Cognitive Status: Difficult to assess Arousal/Alertness: Awake/alert       Comprehension  Auditory Comprehension Overall Auditory Comprehension: Impaired Yes/No Questions: Impaired Basic Immediate Environment Questions:  (5/5) Complex Questions:  (1/5) Paragraph Comprehension (via yes/no questions):  (1/4) Commands: Impaired One Step Basic Commands:  (4/4) Two Step Basic Commands:  (3/4) Multistep Basic Commands:  (0/2) Conversation:  (moderately complex) Reading Comprehension Reading Status: Impaired Word level: Within functional limits Sentence Level: Impaired (4/4 with additional processing time and self-correction) Paragraph Level: Impaired    Expression Expression Primary Mode of Expression: Verbal Verbal Expression Overall Verbal Expression: Impaired Initiation: No impairment Automatic Speech: Day of week;Month of year (With additional processing time) Level of Generative/Spontaneous Verbalization: Conversation Repetition: Impaired Level of Impairment: Sentence level (3/5) Naming: Impairment Responsive:  (3/5) Confrontation:  (6/10) Convergent:  (Sentence completion: 3/5) Verbal Errors: Phonemic paraphasias;Neologisms Written Expression Dominant Hand: Left   Oral / Motor  Oral Motor/Sensory Function Overall Oral Motor/Sensory Function: Mild impairment Facial Symmetry: Abnormal symmetry right;Suspected CN VII (facial) dysfunction Motor Speech Overall Motor Speech: Appears within functional limits for tasks assessed Respiration: Within functional limits Phonation: Normal Resonance: Within functional limits Articulation: Within functional limitis Intelligibility: Intelligible           [place]  Jodi Marble, MS, CCC-SLP Neuro Diagnostic Specialist  Acute Rehabilitation Services Office number: [phone]  Scheryl Marten 08/13/2022, 12:25 PM

## 2022-08-13 NOTE — Evaluation (Signed)
Physical Therapy Evaluation Patient Details Name: [name] [name] MRN: [id number] DOB: [birth year] Today's Date: 08/13/2022  History of Present Illness  53 female presented 6/27 with garbled speech, found to have L M3 occlusion s/p thrombectomy 6/27. Pt with increased agitation after extubation leading to R groin hematoma and reintubation, extubated 6/28.   PMH includes: CVA, DM HTN HLD Chronic pain arthritis, OSA, bilateral TKA in 2016-2017, and wears glasses.   Clinical Impression  Pt in bed upon arrival of PT, agreeable to evaluation at this time. Prior to admission the pt was ambulating short distances in her home, using a cane or power scooter for errands in the community, but reports she was independent with ADLs and cooking in the home. The pt now presents with limitations in functional mobility, power, stability, strength, and endurance due to above dx, and will continue to benefit from skilled PT to address these deficits. She also reports a largely sedentary lifestyle and is likely near baseline level of endurance, but the pt needed more physical assist and UE support to manage with short-distance ambulation in the room today than she described prior to admission. The pt was limited to ~25 ft ambulation with minA and HHA, will continue to benefit from skilled PT acutely and after d/c to maximize strength and mobility as well as return to prior level of independence.         Recommendations for follow up therapy are one component of a multi-disciplinary discharge planning process, led by the attending physician.  Recommendations may be updated based on patient status, additional functional criteria and insurance authorization.  Follow Up Recommendations       Assistance Recommended at Discharge Frequent or constant Supervision/Assistance  Patient can return home with the following  A little help with walking and/or transfers;A little help with bathing/dressing/bathroom;Assistance with  cooking/housework;Assist for transportation;Help with stairs or ramp for entrance    Equipment Recommendations None recommended by PT  Recommendations for Other Services       Functional Status Assessment Patient has had a recent decline in their functional status and demonstrates the ability to make significant improvements in function in a reasonable and predictable amount of time.     Precautions / Restrictions Precautions Precautions: Fall Precaution Comments: watch BP Restrictions Weight Bearing Restrictions: No      Mobility  Bed Mobility Overal bed mobility: Needs Assistance Bed Mobility: Supine to Sit     Supine to sit: Min guard     General bed mobility comments: increased time, use of bed rail    Transfers Overall transfer level: Needs assistance Equipment used: None Transfers: Sit to/from Stand, Bed to chair/wheelchair/BSC Sit to Stand: Min assist   Step pivot transfers: Min assist       General transfer comment: minA to rise from low surface and steady in stance. pt reaching for UE support with turning towards chair or toilet.    Ambulation/Gait Ambulation/Gait assistance: Min assist Gait Distance (Feet): 25 Feet Assistive device: 1 person hand held assist Gait Pattern/deviations: Step-through pattern, Wide base of support, Shuffle Gait velocity: decreased     General Gait Details: minA to steady, minmal clearance, UE support through HHA. pt reports this is similar to distance walked at home    Modified Rankin (Stroke Patients Only) Modified Rankin (Stroke Patients Only) Pre-Morbid Rankin Score: No significant disability Modified Rankin: Moderately severe disability     Balance Overall balance assessment: Needs assistance Sitting-balance support: No upper extremity supported, Feet supported Sitting balance-Leahy Scale: Fair  Standing balance support: Single extremity supported, During functional activity Standing balance-Leahy Scale:  Fair                               Pertinent Vitals/Pain Pain Assessment Pain Assessment: No/denies pain    Home Living Family/patient expects to be discharged to:: Private residence Living Arrangements: Children;Other relatives (2 grandchildren (24 and 28)) Available Help at Discharge: Family;Available PRN/intermittently Type of Home: Apartment Home Access: Stairs to enter Entrance Stairs-Rails: None Entrance Stairs-Number of Steps: 1   Home Layout: Multi-level;Able to live on main level with bedroom/bathroom;1/2 bath on main level Home Equipment: Rolling Walker (2 wheels);Rollator (4 wheels);Cane - single point;Wheelchair - manual      Prior Function Prior Level of Function : Driving             Mobility Comments: used cane in community, reports use of WC in the home, no falls ADLs Comments: independent bird bath as no full bath onfirst floor, pt reports use of recliner for sleep during the day, often with feet down     Hand Dominance   Dominant Hand: Left    Extremity/Trunk Assessment   Upper Extremity Assessment Upper Extremity Assessment: Defer to OT evaluation    Lower Extremity Assessment Lower Extremity Assessment: LLE deficits/detail;Generalized weakness;RLE deficits/detail RLE Deficits / Details: significant edema in feet and lower leg, pt reports this is normal for her since TKA (2016?) and that she often sleeps with legs in dependent position, grossly 4/5 to MMT pt reports no change in sensation LLE Deficits / Details: significant edema in feet and lower leg, pt reports this is normal for her since TKA (2016?) and that she often sleeps with legs in dependent position, grossly 4-/5 to MMT pt reports no change in sensation    Cervical / Trunk Assessment Cervical / Trunk Assessment: Other exceptions Cervical / Trunk Exceptions: large body habitus  Communication   Communication: Expressive difficulties (intermittent word finding difficulties)   Cognition Arousal/Alertness: Awake/alert Behavior During Therapy: WFL for tasks assessed/performed Overall Cognitive Status: Difficult to assess                                 General Comments: no family present to confirm baseline, pt with intermittent word-finding deficits but able to recognize errors and correct. Pt followed instructions given in session, able to express needs.        General Comments General comments (skin integrity, edema, etc.): BP stable with mobility, drop from systolic in 140s to 111 with stand, recovered to 130s after 3 min. pt reports no dizziness        Assessment/Plan    PT Assessment Patient needs continued PT services  PT Problem List Cardiopulmonary status limiting activity;Decreased activity tolerance;Decreased balance       PT Treatment Interventions DME instruction;Gait training;Stair training;Functional mobility training;Therapeutic activities;Balance training;Therapeutic exercise;Patient/family education    PT Goals (Current goals can be found in the Care Plan section)  Acute Rehab PT Goals Patient Stated Goal: return home PT Goal Formulation: With patient Time For Goal Achievement: 08/27/22 Potential to Achieve Goals: Good    Frequency Min 3X/week     Co-evaluation PT/OT/SLP Co-Evaluation/Treatment: Yes Reason for Co-Treatment: Necessary to address cognition/behavior during functional activity;For patient/therapist safety;To address functional/ADL transfers PT goals addressed during session: Mobility/safety with mobility;Balance;Strengthening/ROM         AM-PAC PT "6 Clicks"  Mobility  Outcome Measure Help needed turning from your back to your side while in a flat bed without using bedrails?: A Little Help needed moving from lying on your back to sitting on the side of a flat bed without using bedrails?: A Little Help needed moving to and from a bed to a chair (including a wheelchair)?: A Little Help needed  standing up from a chair using your arms (e.g., wheelchair or bedside chair)?: A Little Help needed to walk in hospital room?: A Little Help needed climbing 3-5 steps with a railing? : A Lot 6 Click Score: 17    End of Session Equipment Utilized During Treatment: Gait belt Activity Tolerance: Patient tolerated treatment well Patient left: in chair;with call bell/phone within reach;with chair alarm set Nurse Communication: Mobility status PT Visit Diagnosis: Unsteadiness on feet (R26.81);Other abnormalities of gait and mobility (R26.89)    Time: 1610-9604 PT Time Calculation (min) (ACUTE ONLY): 44 min   Charges:   PT Evaluation $PT Eval Low Complexity: 1 Low PT Treatments $Gait Training: 8-22 mins        Vickki Muff, PT, DPT   Acute Rehabilitation Department Office [phone] Secure Chat Communication Preferred  [name] 08/13/2022, 1:52 PM

## 2022-08-13 NOTE — Progress Notes (Signed)
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 Mechanical thrombectomy performed with direct contact aspiration with complete recanalization after one pass. Slow flow noted in distal M4 branch (TICI 2C). Post procedural flat panel CT showed basal ganglia and anterior temporal contrast staining. No parenchymal or subarachnoid hemorrhage. Immediately after extubation patient became very agitated and combative. Hematoma was noted in the thigh and groin. Pressure held. Patient was reintubated. [name] Arterial line placed. Hematoma stable and marked.   On today's exam, patient was normotensive but RN reports episodes of hypotension throughout the morning. Patient was otherwise well-appearing, reports mild tenderness at groin puncture site.  MRI 6/27- IMPRESSION: 1. Intracranial atherosclerosis. Improved appearance of Left MCA posterior division branches following endovascular reperfusion. 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 2. Mild, scattered small acute infarcts in the Left MCA and Left MCA/PCA watershed territories. No hemorrhagic transformation or mass effect. 3. No new intracranial abnormality. 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   [place] groin: Neg for pseudoaneurysm    Allergies: Metoprolol tartrate  Medications: Prior to Admission medications   Medication Sig Start Date End Date Taking? Authorizing Provider  albuterol (VENTOLIN HFA) 108 (90 Base) MCG/ACT inhaler Inhale 2 puffs into the lungs every 4 (four) hours as needed for wheezing or shortness of breath. 07/25/20   [provider]  carvedilol (COREG) 25 MG tablet Take 50 mg by mouth 2 (two) times daily. 06/16/20   [provider]  clopidogrel (PLAVIX) 75 MG tablet Take 1 tablet (75 mg total) by mouth daily. 10/11/17   Albertine Grates, MD  cyclobenzaprine (FLEXERIL) 10 MG tablet Take 10 mg by mouth 2 (two) times daily as needed for muscle spasms. 07/16/20   [provider]  diclofenac (VOLTAREN) 75 MG EC tablet Take 75 mg by mouth daily as needed for mild pain or moderate pain.    [provider]  estradiol (ESTRACE) 0.1 MG/GM vaginal cream Place 1 Applicatorful vaginally daily as needed (vaginal burning). 06/30/20   [provider]  furosemide (LASIX) 20 MG tablet Take 1 tablet (20 mg total) by mouth daily. 10/25/20   Melene Plan, DO  gabapentin (NEURONTIN) 100 MG capsule Take 1 capsule (100 mg total) by mouth 3 (three) times daily. 06/19/22   Pricilla Loveless, MD  lidocaine (XYLOCAINE) 2 % solution Use as directed 15 mLs in the mouth or throat every 3 (three) hours as needed for mouth pain (Sore throat). 03/31/21   Theadora Rama Scales, PA-C  minoxidil (LONITEN) 2.5 MG tablet Take 5 mg by mouth daily. 06/23/20   [provider]  mupirocin cream (BACTROBAN) 2 % Apply 1 application topically daily as needed (rash under breast).    [provider]  nystatin (MYCOSTATIN/NYSTOP) powder Apply 1 application topically 3 (three) times daily as needed for rash. 06/11/20   [provider]  pantoprazole (PROTONIX) 40 MG tablet Take 40 mg by mouth daily as needed (heartburn).    [provider]  solifenacin (VESICARE) 10 MG tablet Take 10 mg by mouth at bedtime. 07/01/20   [provider]  spironolactone (ALDACTONE) 25 MG tablet Take 25 mg by mouth daily. 06/06/20  [provider]  triamcinolone lotion (KENALOG) 0.1 % Apply 1 application topically daily as needed (Reash under breast).    [provider]  valsartan (DIOVAN) 320 MG tablet Take 320  mg by mouth at bedtime. 06/24/20   [provider]     Vital Signs: BP (!) 136/94 (BP Location: Right Wrist)   Pulse 89   Temp 98.8 F (37.1 C) (Axillary)   Resp 15   Ht 5\' 4"  (1.626 m)   Wt 290 lb 12.6 oz (131.9 kg)   SpO2 98%   BMI 49.91 kg/m   Physical Exam Vitals reviewed.  Skin:    General: Skin is warm.   Alert, awake, and oriented x3 Speech and comprehension intact PERRL  EOMs intact, no nystagmus or subjective diplopia. No facial asymmetry. Tongue midline  Motor power 5/5 in RUE, RLE, and LUE. Patient with decreased movement of LLE secondary to left hip pain Distal pulses dopplerable   Imaging: ARTERIAL PSEUDOANEURYSM COMPRESSION  Result Date: 08/12/2022  ARTERIAL PSEUDOANEURYSM  Patient Name:  [name] [name]  Date of Exam:   08/12/2022 Medical Rec #: [id number]        Accession #:    [id number] Date of Birth: 1950/04/27        Patient Gender: F Patient Age:   72 years Exam Location:  [facility] Procedure:      VAS [place] LOWER EXT ARTERIAL PSEUDOANEURYSM COMPRESSION Referring Phys: Jerilynn Mages DE MACEDO RODRIGUES --------------------------------------------------------------------------------  Exam: Right groin Indications: Patient complains of bruising and hematoma. History: S/p cerebral angiogram and thrombectomy 6/26. Comparison Study: No prior study Performing Technologist: Sherren Kerns RVS  Examination Guidelines: A complete evaluation includes B-mode imaging, spectral Doppler, color Doppler, and power Doppler as needed of all accessible portions of each vessel. Bilateral testing is considered an integral part of a complete examination. Limited examinations for reoccurring indications may be performed as noted.  Summary: No evidence of pseudoaneurysm, AVF or DVT  Diagnosing physician: Gerarda Fraction Electronically signed by Gerarda Fraction on 08/12/2022 at 7:36:36 PM.   --------------------------------------------------------------------------------    Final     ECHOCARDIOGRAM COMPLETE  Result Date: 08/12/2022    ECHOCARDIOGRAM REPORT   Patient Name:   [name] [name] Date of Exam: 08/12/2022 Medical Rec #:  [id number]       Height:       64.0 in Accession #:    [id number]      Weight:       290.8 lb Date of Birth:  06/13/1950       BSA:          2.294 m Patient Age:    72 years        BP:           134/47 mmHg Patient Gender: F               HR:           84 bpm. Exam Location:  Inpatient Procedure: 2D Echo, Cardiac Doppler, Color Doppler and Intracardiac            Opacification Agent Indications:    Stroke I63.9  History:        Patient has prior history of Echocardiogram examinations, most                 recent 10/06/2017. Stroke; Risk Factors:Hypertension,                 Dyslipidemia and Former Smoker.  Sonographer:    Dondra Prader  RVT RCS Referring Phys: [id number] ERIC LINDZEN  Sonographer Comments: Suboptimal apical window. 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 IMPRESSIONS  1. Left ventricular ejection fraction, by estimation, is 70 to 75%. The left ventricle has hyperdynamic function. The left ventricle has no regional wall motion abnormalities. There is moderate concentric left ventricular hypertrophy. Left ventricular diastolic parameters are consistent with Grade I diastolic dysfunction (impaired relaxation).  2. Right ventricular systolic function is normal. The right ventricular size is normal.  3. The mitral valve is grossly normal. Trivial mitral valve regurgitation. No evidence of mitral stenosis.  4. The aortic valve was not well visualized. Aortic valve regurgitation is not visualized.  5. The inferior vena cava is normal in size with greater than 50% respiratory variability, suggesting right atrial pressure of 3 mmHg. Comparison(s): No significant change from prior study. Conclusion(s)/Recommendation(s): No intracardiac source of embolism detected on this transthoracic study. Consider a transesophageal echocardiogram to exclude  cardiac source of embolism if clinically indicated. FINDINGS  Left Ventricle: Left ventricular ejection fraction, by estimation, is 70 to 75%. The left ventricle has hyperdynamic function. The left ventricle has no regional wall motion abnormalities. Definity contrast agent was given IV to delineate the left ventricular endocardial borders. The left ventricular internal cavity size was normal in size. There is moderate concentric left ventricular hypertrophy. Left ventricular diastolic parameters are consistent with Grade I diastolic dysfunction (impaired relaxation). Right Ventricle: The right ventricular size is normal. No increase in right ventricular wall thickness. Right ventricular systolic function is normal. Left Atrium: Left atrial size was normal in size. Right Atrium: Right atrial size was normal in size. Pericardium: There is no evidence of pericardial effusion. Mitral Valve: The mitral valve is grossly normal. Mild to moderate mitral annular calcification. Trivial mitral valve regurgitation. No evidence of mitral valve stenosis. Tricuspid Valve: The tricuspid valve is normal in structure. Tricuspid valve regurgitation is trivial. Aortic Valve: The aortic valve was not well visualized. Aortic valve regurgitation is not visualized. Pulmonic Valve: The pulmonic valve was normal in structure. Pulmonic valve regurgitation is trivial. Aorta: The aortic root is normal in size and structure. Venous: The inferior vena cava is normal in size with greater than 50% respiratory variability, suggesting right atrial pressure of 3 mmHg. IAS/Shunts: The atrial septum is grossly normal.  LEFT VENTRICLE           + PLAX 2D LVIDd:         4.05 cm LVIDs:         2.50 cm   Diastology LV PW:         1.25 cm   LV e' medial:    4.35 cm/s LV IVS:        1.40 cm   LV E/e' medial:  17.8 LVOT diam:     2.00 cm   LV e' lateral:   5.22 cm/s LV SV:         69        LV E/e' lateral: 14.9 LV SV Index:   30 LVOT Area:     3.14 cm   RIGHT VENTRICLE             IVC RV S prime:     13.80 cm/s  IVC diam: 1.60 cm TAPSE (M-mode): 1.8 cm LEFT ATRIUM             Index        RIGHT ATRIUM           Index LA  diam:        3.50 cm 1.53 cm/m   RA Area:     14.30 cm LA Vol (A2C):   32.9 ml 14.34 ml/m  RA Volume:   30.80 ml  13.43 ml/m LA Vol (A4C):   25.0 ml 10.90 ml/m LA Biplane Vol: 30.0 ml 13.08 ml/m  AORTIC VALVE LVOT Vmax:   106.00 cm/s LVOT Vmean:  66.900 cm/s LVOT VTI:    0.220 m  AORTA Ao Root diam: 2.80 cm Ao Asc diam:  2.20 cm MITRAL VALVE               TRICUSPID VALVE MV Area (PHT): 2.73 cm    TR Peak grad:   11.0 mmHg MV Decel Time: 278 msec    TR Vmax:        166.00 cm/s MV E velocity: 77.60 cm/s MV A velocity: 87.00 cm/s  SHUNTS MV E/A ratio:  0.89        Systemic VTI:  0.22 m                            Systemic Diam: 2.00 cm Laurance Flatten MD Electronically signed by Laurance Flatten MD Signature Date/Time: 08/12/2022/1:46:15 PM    Final    IR PERCUTANEOUS ART THROMBECTOMY/INFUSION INTRACRANIAL INC DIAG ANGIO  Result Date: 08/12/2022 INDICATION: [name] [name] is an 72 year old female presenting with acute onset of aphasia and right mid hemianopia; NIHSS 4. Her past medical history significant for arthritis, right basal ganglia stroke, DM2, HLD, HTN, morbid obesity and OSA; premorbid modified Rankin scale 0. Her last known well was 1:00 PM on 08/11/2022. Head CT showed no evidence of an acute infarct or hemorrhage. CT angiogram of the head and neck showed a left M3/MCA posterior division branch occlusion. She comes to our service for mechanical thrombectomy. EXAM: ULTRASOUND-GUIDED VASCULAR ACCESS DIAGNOSTIC CEREBRAL ANGIOGRAM MECHANICAL THROMBECTOMY FLAT PANEL HEAD CT ARTERIAL LINE PLACEMENT COMPARISON:  CT/CT angiogram of the head and neck 08/11/2022. MEDICATIONS: Ancef 3 g IV. ANESTHESIA/SEDATION: The procedure was performed under general anesthesia. CONTRAST:  64 mL of Omnipaque 300 milligram/mL FLUOROSCOPY: Radiation  Exposure Index (as provided by the fluoroscopic device): 2,328 mGy Kerma COMPLICATIONS: SIR Level A - No therapy, no consequence. TECHNIQUE: Informed written consent was waved due to altered mental status of patient with no healthcare proxy or next of kin available and emergent nature of procedure. Three physicians agreed that the procedure was necessary. Maximal Sterile Barrier Technique was utilized including caps, mask, sterile gowns, sterile gloves, sterile drape, hand hygiene and skin antiseptic. A timeout was performed prior to the initiation of the procedure. The right groin was prepped and draped in the usual sterile fashion. Using a micropuncture kit and the modified Seldinger technique, access was gained to the right common femoral artery and an 8 French sheath was placed. Real-time ultrasound guidance was utilized for vascular access including the acquisition of a permanent ultrasound image documenting patency of the accessed vessel. Under fluoroscopy, a Zoom 88 guide catheter was navigated over a 6 [place] Berenstein 2 catheter and a 0.035" Terumo Glidewire into the aortic arch. The catheter was placed into the left common carotid artery and then advanced into the left internal carotid artery. The diagnostic catheter was removed. Frontal, lateral angiograms and multiple magnified oblique angiograms of the head were obtained. FINDINGS: 1. Atherosclerotic changes of the right common femoral artery without hemodynamically significant stenosis. 2. Perfusion wedge defect in the left temporoparietal region.  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 PROCEDURE: Using biplane roadmap guidance, a 38 Socrates aspiration catheter was navigated over a phenom 21 microcatheter and an Aristotle 14 microguidewire into the cavernous segment of the left ICA. The microcatheter was then navigated over the wire into the left M3/MCA posterior division branch. Multiple attempts necessary to access the  occluded M3 branch. Then, the aspiration catheter was advanced over the microcatheter into the occluded branch and connected to an aspiration pump. The microcatheter was removed. After approximately 1 minutes, the aspiration catheter was removed under constant aspiration. The guide catheter was aspirated for debris. Left internal carotid artery angiograms with frontal and lateral views of the head showed recanalization of the left M3 branch - TICI 2C. Slow flow is seen at the terminal cortical branches (M4). Flat panel CT of the head was obtained and post processed in a separate workstation with concurrent attending physician supervision. Selected images were sent to PACS. No evidence of hemorrhagic complication. Contrast staining of the basal ganglia and anterior left temporal lobe seen. Right common femoral artery angiogram was obtained in right anterior oblique view. The puncture is at the level of the common femoral artery. The artery has normal caliber, adequate for closure device. The sheath was exchanged over the wire for an 8 [place] Angio-Seal which was utilized for access closure. However, collagen plug herniated through the skin. Manual pressure was then held. Hemostasis was achieved. Patient was extubated. However, due to extreme agitation and combativeness, sedation and re-intubation was necessary. At this point, a groin hematoma was noted and systolic blood pressure dropped to 60 mmHg. Additional manual pressure was held at the access site for approximately 45 minutes. For better blood pressure monitoring, access was gained to the right radial artery using the modified Seldinger technique for arterial line placement. A 20 gauge catheter was utilized. Real-time ultrasound guidance was utilized for vascular access including the acquisition of a permanent ultrasound image documenting patency of the accessed vessel. Patient was then transferred to ICU in stable condition. IMPRESSION: Successful mechanical  thrombectomy performed for treatment of a left M3/MCA occlusion achieving complete recanalization after 1 pass with slow flow noted in the distal M4 branches (TICI 2 C). No hemorrhagic complication. PLAN: Transfer to ICU for continued post stroke care. Electronically Signed   By: Baldemar Lenis M.D.   On: 08/12/2022 10:42   IR US Guide Vasc Access Right  Result Date: 08/12/2022 INDICATION: [name] [name] is an 72 year old female presenting with acute onset of aphasia and right mid hemianopia; NIHSS 4. Her past medical history significant for arthritis, right basal ganglia stroke, DM2, HLD, HTN, morbid obesity and OSA; premorbid modified Rankin scale 0. Her last known well was 1:00 PM on 08/11/2022. Head CT showed no evidence of an acute infarct or hemorrhage. CT angiogram of the head and neck showed a left M3/MCA posterior division branch occlusion. She comes to our service for mechanical thrombectomy. EXAM: ULTRASOUND-GUIDED VASCULAR ACCESS DIAGNOSTIC CEREBRAL ANGIOGRAM MECHANICAL THROMBECTOMY FLAT PANEL HEAD CT ARTERIAL LINE PLACEMENT COMPARISON:  CT/CT angiogram of the head and neck 08/11/2022. MEDICATIONS: Ancef 3 g IV. ANESTHESIA/SEDATION: The procedure was performed under general anesthesia. CONTRAST:  64 mL of Omnipaque 300 milligram/mL FLUOROSCOPY: Radiation Exposure Index (as provided by the fluoroscopic device): 2,328 mGy Kerma COMPLICATIONS: SIR Level A - No therapy, no consequence. TECHNIQUE: Informed written consent was waved due to altered mental status of patient with no healthcare proxy or next of kin available and emergent  nature of procedure. Three physicians agreed that the procedure was necessary. Maximal Sterile Barrier Technique was utilized including caps, mask, sterile gowns, sterile gloves, sterile drape, hand hygiene and skin antiseptic. A timeout was performed prior to the initiation of the procedure. The right groin was prepped and draped in the usual sterile fashion.  Using a micropuncture kit and the modified Seldinger technique, access was gained to the right common femoral artery and an 8 French sheath was placed. Real-time ultrasound guidance was utilized for vascular access including the acquisition of a permanent ultrasound image documenting patency of the accessed vessel. Under fluoroscopy, a Zoom 88 guide catheter was navigated over a 6 [place] Berenstein 2 catheter and a 0.035" Terumo Glidewire into the aortic arch. The catheter was placed into the left common carotid artery and then advanced into the left internal carotid artery. The diagnostic catheter was removed. Frontal, lateral angiograms and multiple magnified oblique angiograms of the head were obtained. FINDINGS: 1. Atherosclerotic changes of the right common femoral artery without hemodynamically significant stenosis. 2. Perfusion wedge defect in the left temporoparietal region. 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 PROCEDURE: Using biplane roadmap guidance, a 38 Socrates aspiration catheter was navigated over a phenom 21 microcatheter and an Aristotle 14 microguidewire into the cavernous segment of the left ICA. The microcatheter was then navigated over the wire into the left M3/MCA posterior division branch. Multiple attempts necessary to access the occluded M3 branch. Then, the aspiration catheter was advanced over the microcatheter into the occluded branch and connected to an aspiration pump. The microcatheter was removed. After approximately 1 minutes, the aspiration catheter was removed under constant aspiration. The guide catheter was aspirated for debris. Left internal carotid artery angiograms with frontal and lateral views of the head showed recanalization of the left M3 branch - TICI 2C. Slow flow is seen at the terminal cortical branches (M4). Flat panel CT of the head was obtained and post processed in a separate workstation with concurrent attending physician  supervision. Selected images were sent to PACS. No evidence of hemorrhagic complication. Contrast staining of the basal ganglia and anterior left temporal lobe seen. Right common femoral artery angiogram was obtained in right anterior oblique view. The puncture is at the level of the common femoral artery. The artery has normal caliber, adequate for closure device. The sheath was exchanged over the wire for an 8 [place] Angio-Seal which was utilized for access closure. However, collagen plug herniated through the skin. Manual pressure was then held. Hemostasis was achieved. Patient was extubated. However, due to extreme agitation and combativeness, sedation and re-intubation was necessary. At this point, a groin hematoma was noted and systolic blood pressure dropped to 60 mmHg. Additional manual pressure was held at the access site for approximately 45 minutes. For better blood pressure monitoring, access was gained to the right radial artery using the modified Seldinger technique for arterial line placement. A 20 gauge catheter was utilized. Real-time ultrasound guidance was utilized for vascular access including the acquisition of a permanent ultrasound image documenting patency of the accessed vessel. Patient was then transferred to ICU in stable condition. IMPRESSION: Successful mechanical thrombectomy performed for treatment of a left M3/MCA occlusion achieving complete recanalization after 1 pass with slow flow noted in the distal M4 branches (TICI 2 C). No hemorrhagic complication. PLAN: Transfer to ICU for continued post stroke care. Electronically Signed   By: Baldemar Lenis M.D.   On: 08/12/2022 10:42   IR CT  Head Ltd  Result Date: 08/12/2022 INDICATION: [name] [name] is an 72 year old female presenting with acute onset of aphasia and right mid hemianopia; NIHSS 4. Her past medical history significant for arthritis, right basal ganglia stroke, DM2, HLD, HTN, morbid obesity and OSA;  premorbid modified Rankin scale 0. Her last known well was 1:00 PM on 08/11/2022. Head CT showed no evidence of an acute infarct or hemorrhage. CT angiogram of the head and neck showed a left M3/MCA posterior division branch occlusion. She comes to our service for mechanical thrombectomy. EXAM: ULTRASOUND-GUIDED VASCULAR ACCESS DIAGNOSTIC CEREBRAL ANGIOGRAM MECHANICAL THROMBECTOMY FLAT PANEL HEAD CT ARTERIAL LINE PLACEMENT COMPARISON:  CT/CT angiogram of the head and neck 08/11/2022. MEDICATIONS: Ancef 3 g IV. ANESTHESIA/SEDATION: The procedure was performed under general anesthesia. CONTRAST:  64 mL of Omnipaque 300 milligram/mL FLUOROSCOPY: Radiation Exposure Index (as provided by the fluoroscopic device): 2,328 mGy Kerma COMPLICATIONS: SIR Level A - No therapy, no consequence. TECHNIQUE: Informed written consent was waved due to altered mental status of patient with no healthcare proxy or next of kin available and emergent nature of procedure. Three physicians agreed that the procedure was necessary. Maximal Sterile Barrier Technique was utilized including caps, mask, sterile gowns, sterile gloves, sterile drape, hand hygiene and skin antiseptic. A timeout was performed prior to the initiation of the procedure. The right groin was prepped and draped in the usual sterile fashion. Using a micropuncture kit and the modified Seldinger technique, access was gained to the right common femoral artery and an 8 French sheath was placed. Real-time ultrasound guidance was utilized for vascular access including the acquisition of a permanent ultrasound image documenting patency of the accessed vessel. Under fluoroscopy, a Zoom 88 guide catheter was navigated over a 6 [place] Berenstein 2 catheter and a 0.035" Terumo Glidewire into the aortic arch. The catheter was placed into the left common carotid artery and then advanced into the left internal carotid artery. The diagnostic catheter was removed. Frontal, lateral angiograms  and multiple magnified oblique angiograms of the head were obtained. FINDINGS: 1. Atherosclerotic changes of the right common femoral artery without hemodynamically significant stenosis. 2. Perfusion wedge defect in the left temporoparietal region. 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 PROCEDURE: Using biplane roadmap guidance, a 38 Socrates aspiration catheter was navigated over a phenom 21 microcatheter and an Aristotle 14 microguidewire into the cavernous segment of the left ICA. The microcatheter was then navigated over the wire into the left M3/MCA posterior division branch. Multiple attempts necessary to access the occluded M3 branch. Then, the aspiration catheter was advanced over the microcatheter into the occluded branch and connected to an aspiration pump. The microcatheter was removed. After approximately 1 minutes, the aspiration catheter was removed under constant aspiration. The guide catheter was aspirated for debris. Left internal carotid artery angiograms with frontal and lateral views of the head showed recanalization of the left M3 branch - TICI 2C. Slow flow is seen at the terminal cortical branches (M4). Flat panel CT of the head was obtained and post processed in a separate workstation with concurrent attending physician supervision. Selected images were sent to PACS. No evidence of hemorrhagic complication. Contrast staining of the basal ganglia and anterior left temporal lobe seen. Right common femoral artery angiogram was obtained in right anterior oblique view. The puncture is at the level of the common femoral artery. The artery has normal caliber, adequate for closure device. The sheath was exchanged over the wire for an 8 [place] Angio-Seal which was  utilized for access closure. However, collagen plug herniated through the skin. Manual pressure was then held. Hemostasis was achieved. Patient was extubated. However, due to extreme agitation and combativeness,  sedation and re-intubation was necessary. At this point, a groin hematoma was noted and systolic blood pressure dropped to 60 mmHg. Additional manual pressure was held at the access site for approximately 45 minutes. For better blood pressure monitoring, access was gained to the right radial artery using the modified Seldinger technique for arterial line placement. A 20 gauge catheter was utilized. Real-time ultrasound guidance was utilized for vascular access including the acquisition of a permanent ultrasound image documenting patency of the accessed vessel. Patient was then transferred to ICU in stable condition. IMPRESSION: Successful mechanical thrombectomy performed for treatment of a left M3/MCA occlusion achieving complete recanalization after 1 pass with slow flow noted in the distal M4 branches (TICI 2 C). No hemorrhagic complication. PLAN: Transfer to ICU for continued post stroke care. Electronically Signed   By: Baldemar Lenis M.D.   On: 08/12/2022 10:42   IR US Guide Vasc Access Right  Result Date: 08/12/2022 INDICATION: Zalayah Roseboom is an 72 year old female presenting with acute onset of aphasia and right mid hemianopia; NIHSS 4. Her past medical history significant for arthritis, right basal ganglia stroke, DM2, HLD, HTN, morbid obesity and OSA; premorbid modified Rankin scale 0. Her last known well was 1:00 PM on 08/11/2022. Head CT showed no evidence of an acute infarct or hemorrhage. CT angiogram of the head and neck showed a left M3/MCA posterior division branch occlusion. She comes to our service for mechanical thrombectomy. EXAM: ULTRASOUND-GUIDED VASCULAR ACCESS DIAGNOSTIC CEREBRAL ANGIOGRAM MECHANICAL THROMBECTOMY FLAT PANEL HEAD CT ARTERIAL LINE PLACEMENT COMPARISON:  CT/CT angiogram of the head and neck 08/11/2022. MEDICATIONS: Ancef 3 g IV. ANESTHESIA/SEDATION: The procedure was performed under general anesthesia. CONTRAST:  64 mL of Omnipaque 300 milligram/mL  FLUOROSCOPY: Radiation Exposure Index (as provided by the fluoroscopic device): 2,328 mGy Kerma COMPLICATIONS: SIR Level A - No therapy, no consequence. TECHNIQUE: Informed written consent was waved due to altered mental status of patient with no healthcare proxy or next of kin available and emergent nature of procedure. Three physicians agreed that the procedure was necessary. Maximal Sterile Barrier Technique was utilized including caps, mask, sterile gowns, sterile gloves, sterile drape, hand hygiene and skin antiseptic. A timeout was performed prior to the initiation of the procedure. The right groin was prepped and draped in the usual sterile fashion. Using a micropuncture kit and the modified Seldinger technique, access was gained to the right common femoral artery and an 8 French sheath was placed. Real-time ultrasound guidance was utilized for vascular access including the acquisition of a permanent ultrasound image documenting patency of the accessed vessel. Under fluoroscopy, a Zoom 88 guide catheter was navigated over a 6 [place] Berenstein 2 catheter and a 0.035" Terumo Glidewire into the aortic arch. The catheter was placed into the left common carotid artery and then advanced into the left internal carotid artery. The diagnostic catheter was removed. Frontal, lateral angiograms and multiple magnified oblique angiograms of the head were obtained. FINDINGS: 1. Atherosclerotic changes of the right common femoral artery without hemodynamically significant stenosis. 2. Perfusion wedge defect in the left temporoparietal region. 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 PROCEDURE: Using biplane roadmap guidance, a 38 Socrates aspiration catheter was navigated over a phenom 21 microcatheter and an Aristotle 14 microguidewire into the cavernous segment of the left ICA. The microcatheter  was then navigated over the wire into the left M3/MCA posterior division branch. Multiple attempts  necessary to access the occluded M3 branch. Then, the aspiration catheter was advanced over the microcatheter into the occluded branch and connected to an aspiration pump. The microcatheter was removed. After approximately 1 minutes, the aspiration catheter was removed under constant aspiration. The guide catheter was aspirated for debris. Left internal carotid artery angiograms with frontal and lateral views of the head showed recanalization of the left M3 branch - TICI 2C. Slow flow is seen at the terminal cortical branches (M4). Flat panel CT of the head was obtained and post processed in a separate workstation with concurrent attending physician supervision. Selected images were sent to PACS. No evidence of hemorrhagic complication. Contrast staining of the basal ganglia and anterior left temporal lobe seen. Right common femoral artery angiogram was obtained in right anterior oblique view. The puncture is at the level of the common femoral artery. The artery has normal caliber, adequate for closure device. The sheath was exchanged over the wire for an 8 [place] Angio-Seal which was utilized for access closure. However, collagen plug herniated through the skin. Manual pressure was then held. Hemostasis was achieved. Patient was extubated. However, due to extreme agitation and combativeness, sedation and re-intubation was necessary. At this point, a groin hematoma was noted and systolic blood pressure dropped to 60 mmHg. Additional manual pressure was held at the access site for approximately 45 minutes. For better blood pressure monitoring, access was gained to the right radial artery using the modified Seldinger technique for arterial line placement. A 20 gauge catheter was utilized. Real-time ultrasound guidance was utilized for vascular access including the acquisition of a permanent ultrasound image documenting patency of the accessed vessel. Patient was then transferred to ICU in stable condition. IMPRESSION:  Successful mechanical thrombectomy performed for treatment of a left M3/MCA occlusion achieving complete recanalization after 1 pass with slow flow noted in the distal M4 branches (TICI 2 C). No hemorrhagic complication. PLAN: Transfer to ICU for continued post stroke care. Electronically Signed   By: Baldemar Lenis M.D.   On: 08/12/2022 10:42   MR BRAIN WO CONTRAST  Result Date: 08/12/2022 CLINICAL DATA:  72 year old female code stroke presentation yesterday with left MCA branch occlusion status post endovascular revascularization. EXAM: MRI HEAD WITHOUT CONTRAST MRA HEAD WITHOUT CONTRAST TECHNIQUE: Multiplanar, multi-echo pulse sequences of the brain and surrounding structures were acquired without intravenous contrast. Angiographic images of the Circle of Willis were acquired using MRA technique without intravenous contrast. COMPARISON:  CTA head and neck yesterday. Previous brain MRI 10/06/2017. FINDINGS: MRI HEAD FINDINGS Brain: Punctate diffusion restriction in the left caudate nucleus suspected on series 5, image 74. And mild scattered posterior left MCA, left MCA/PCA watershed cortical and white matter diffusion restriction (series 5, images 72 and 75). Mild associated cytotoxic edema. No hemorrhagic transformation; underlying chronic microhemorrhage in the left occipital lobe white matter series 16, image 28 on [facility]. No contralateral right hemisphere or posterior fossa restricted diffusion. 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 Chronic right basal ganglia lacunar infarcts are new since that time. Widely scattered patchy bilateral cerebral white matter T2 and FLAIR hyperintensity otherwise not significantly changed. No midline shift, mass effect, evidence of mass lesion, ventriculomegaly, extra-axial collection or acute intracranial hemorrhage. Cervicomedullary junction and pituitary are within normal limits. Vascular: Major intracranial vascular flow voids appear  stable. Skull and upper cervical spine: Negative for age visible cervical spine. Visualized bone  marrow signal is within normal limits. Sinuses/Orbits: Negative orbits. Paranasal sinus mucosal thickening and fluid in the setting of intubation. Other: Fluid in the nasal cavity and nasopharynx in the setting of intubation. Mastoids remain well aerated. MRA HEAD FINDINGS Anterior circulation: Antegrade flow in both ICA siphons. Left greater than right ICA siphon irregularity. Patent carotid termini, MCA and right ACA origins. Left ACA A1 irregularity and stenosis is stable on series 1065, image 11. Other visible ACA branches are within normal limits. Right MCA M1 segment and visible right MCA branches are stable, including moderate posterior division irregularity and stenosis on series 1071, image 15). Left MCA M1 segment remains patent with mild distal M1 stenosis (series 1071, image 10). Left MCA branches appear more numerous compared to the CTA yesterday. There is residual moderate to severe tandem a regularity and stenosis in the most posterior division on series 1071, image 6. Posterior circulation: Antegrade flow in the posterior circulation. Dominant distal left vertebral artery, vertebrobasilar junction and basilar artery appear stable. Bilateral PICA, SCA and PCA origins remain patent. Posterior communicating arteries are diminutive or absent. Bilateral PCA branches appear stable. Anatomic variants: Dominant left vertebral artery. IMPRESSION: 1. Intracranial atherosclerosis. Improved appearance of Left MCA posterior division branches following endovascular reperfusion. 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 2. Mild, scattered small acute infarcts in the Left MCA and Left MCA/PCA watershed territories. No hemorrhagic transformation or mass effect. 3. No new intracranial abnormality. 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 Chronic white matter disease and chronic  Right cerebellum PICA infarcts. Electronically Signed   By: Odessa Fleming M.D.   On: 08/12/2022 10:24   MR ANGIO HEAD WO CONTRAST  Result Date: 08/12/2022 CLINICAL DATA:  72 year old female code stroke presentation yesterday with left MCA branch occlusion status post endovascular revascularization. EXAM: MRI HEAD WITHOUT CONTRAST MRA HEAD WITHOUT CONTRAST TECHNIQUE: Multiplanar, multi-echo pulse sequences of the brain and surrounding structures were acquired without intravenous contrast. Angiographic images of the Circle of Willis were acquired using MRA technique without intravenous contrast. COMPARISON:  CTA head and neck yesterday. Previous brain MRI 10/06/2017. FINDINGS: MRI HEAD FINDINGS Brain: Punctate diffusion restriction in the left caudate nucleus suspected on series 5, image 74. And mild scattered posterior left MCA, left MCA/PCA watershed cortical and white matter diffusion restriction (series 5, images 72 and 75). Mild associated cytotoxic edema. No hemorrhagic transformation; underlying chronic microhemorrhage in the left occipital lobe white matter series 16, image 28 on SWI. No contralateral right hemisphere or posterior fossa restricted diffusion. 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 Chronic right basal ganglia lacunar infarcts are new since that time. Widely scattered patchy bilateral cerebral white matter T2 and FLAIR hyperintensity otherwise not significantly changed. No midline shift, mass effect, evidence of mass lesion, ventriculomegaly, extra-axial collection or acute intracranial hemorrhage. Cervicomedullary junction and pituitary are within normal limits. Vascular: Major intracranial vascular flow voids appear stable. Skull and upper cervical spine: Negative for age visible cervical spine. Visualized bone marrow signal is within normal limits. Sinuses/Orbits: Negative orbits. Paranasal sinus mucosal thickening and fluid in the setting of intubation. Other: Fluid in the  nasal cavity and nasopharynx in the setting of intubation. Mastoids remain well aerated. MRA HEAD FINDINGS Anterior circulation: Antegrade flow in both ICA siphons. Left greater than right ICA siphon irregularity. Patent carotid termini, MCA and right ACA origins. Left ACA A1 irregularity and stenosis is stable on series 1065, image 11. Other visible ACA branches are within normal  limits. Right MCA M1 segment and visible right MCA branches are stable, including moderate posterior division irregularity and stenosis on series 1071, image 15). Left MCA M1 segment remains patent with mild distal M1 stenosis (series 1071, image 10). Left MCA branches appear more numerous compared to the CTA yesterday. There is residual moderate to severe tandem a regularity and stenosis in the most posterior division on series 1071, image 6. Posterior circulation: Antegrade flow in the posterior circulation. Dominant distal left vertebral artery, vertebrobasilar junction and basilar artery appear stable. Bilateral PICA, SCA and PCA origins remain patent. Posterior communicating arteries are diminutive or absent. Bilateral PCA branches appear stable. Anatomic variants: Dominant left vertebral artery. IMPRESSION: 1. Intracranial atherosclerosis. Improved appearance of Left MCA posterior division branches following endovascular reperfusion. 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 2. Mild, scattered small acute infarcts in the Left MCA and Left MCA/PCA watershed territories. No hemorrhagic transformation or mass effect. 3. No new intracranial abnormality. 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 Electronically Signed   By: Odessa Fleming M.D.   On: 08/12/2022 10:24   Portable Chest x-ray  Result Date: 08/12/2022 CLINICAL DATA:  [id number] Endotracheal tube present [id number] EXAM: PORTABLE CHEST 1 VIEW COMPARISON:  Chest x-ray  07/08/2022, CT chest 04/23/2016 FINDINGS: Interval placement endotracheal tube with tip terminating 3 cm above the carina. Enteric tube coursing below the hemidiaphragm with tip and side port collimated off view. Enlarged cardiac silhouette. The heart and mediastinal contours are unchanged. Question retrocardiac airspace opacity. No pulmonary edema. No pleural effusion. No pneumothorax. No acute osseous abnormality. IMPRESSION: 1. Question retrocardiac airspace opacity. 2. Lines and tubes as above. Electronically Signed   By: Tish Frederickson M.D.   On: 08/12/2022 01:35   CT ANGIO HEAD NECK W WO CM W PERF (CODE STROKE)  Result Date: 08/11/2022 CLINICAL DATA:  Initial evaluation for neuro deficit, stroke. EXAM: CT ANGIOGRAPHY HEAD AND NECK CT PERFUSION BRAIN TECHNIQUE: Multidetector CT imaging of the head and neck was performed using the standard protocol during bolus administration of intravenous contrast. Multiplanar CT image reconstructions and MIPs were obtained to evaluate the vascular anatomy. Carotid stenosis measurements (when applicable) are obtained utilizing NASCET criteria, using the distal internal carotid diameter as the denominator. Multiphase CT imaging of the brain was performed following IV bolus contrast injection. Subsequent parametric perfusion maps were calculated using RAPID software. RADIATION DOSE REDUCTION: This exam was performed according to the departmental dose-optimization program which includes automated exposure control, adjustment of the mA and/or kV according to patient size and/or use of iterative reconstruction technique. CONTRAST:  75mL OMNIPAQUE IOHEXOL 350 MG/ML SOLN COMPARISON:  Prior CT from earlier the same day. FINDINGS: CTA NECK FINDINGS Aortic arch: Visualized arch normal in caliber with standard 3 vessel morphology. Moderate atheromatous change about the arch itself. No significant stenosis about the origin the great vessels. Right carotid system: Right common and  internal carotid arteries are patent without dissection. Bulky calcified plaque about the right carotid bulb/proximal right ICA associated stenosis of up to 50% by NASCET criteria. Left carotid system: Left common and internal carotid arteries are patent without evidence for dissection. Bulky calcified plaque about the left carotid bulb/proximal left ICA. Associated stenosis of up to 75% by NASCET criteria (series 7, image 191). Vertebral arteries: Both vertebral arteries arise from subclavian arteries. No significant proximal subclavian artery stenosis. Atheromatous plaque at the origin of the left vertebral artery with  associated moderate ostial stenosis. Additional scattered atheromatous change within the distal left V3 segment with moderate diffuse narrowing (series 7, image 162). The contralateral right vertebral artery is largely occluded at its origin. Scant attenuated distal reconstitution at the right V2 segment via collaterals. Right vertebral artery is attenuated but patent distally to the skull base. No dissection. Skeleton: No discrete or worrisome osseous lesions. Anterior endplate degenerative spurring noted throughout the cervical spine. Other neck: No other acute soft tissue abnormality within the neck. Few thyroid nodules noted, largest of which measures 1.9 cm on the right. These have been previously evaluated by thyroid ultrasound on 05/11/2016 (ref: J Am Coll Radiol. 2015 Feb;12(2): 143-50). Upper chest: Mild emphysematous changes. 4 mm right upper lobe nodule (series 8, image 169), indeterminate. No other acute finding. Review of the MIP images confirms the above findings CTA HEAD FINDINGS Anterior circulation: Atheromatous change seen about the carotid siphons with associated moderate multifocal narrowing on the left and more mild narrowing on the right. A1 segments patent bilaterally. Left A1 hypoplastic. Normal anterior communicating complex. Anterior cerebral arteries patent without  significant stenosis. No M1 stenosis or occlusion. Distal right MCA branches are patent and perfused. On the left, there is an acute stump occlusion of a proximal left M3 branch, inferior division (series 7, image 106). Remainder of the left MCA branches remain patent and perfused. Distal small vessel atheromatous irregularity noted. Posterior circulation: Atheromatous change within the dominant proximal left V4 segment with mild stenosis. Diminutive right V4 segment attenuated but patent without stenosis. Both PICA patent. Basilar irregular but patent without significant stenosis. Superior cerebral arteries patent bilaterally. Both PCAs primarily supplied via the basilar. Atheromatous irregularity about the PCAs bilaterally with associated short-segment moderate left P2 stenosis (series 11, image 23). PCAs otherwise patent. Venous sinuses: Grossly patent allowing for timing the contrast bolus. Anatomic variants: As above.  No aneurysm. Review of the MIP images confirms the above findings CT Brain Perfusion Findings: ASPECTS: 10 CBF (<30%) Volume: 0mL Perfusion (Tmax>6.0s) volume: 23mL Mismatch Volume: 23mL Infarction Location:Negative CT perfusion for acute core infarct. 23 mL perfusion deficit at the posterior left MCA distribution, in keeping with the above described left M3 occlusion. IMPRESSION: 1. Acute left M3 stump occlusion, inferior division. 2. Negative CT perfusion for acute core infarct. 23 mL perfusion deficit at the posterior left MCA distribution, in keeping with the above described left M3 occlusion. 3. Bulky calcified plaque about the carotid bifurcations/proximal ICAs with associated stenoses of up to 75% on the left and 50% on the right. 4. Occlusion of the right vertebral artery at its origin. Attenuated distal reconstitution at the right V2 segment via collaterals. Atheromatous change about the dominant left V4 segment with associated moderate stenoses at its origin and about the left V3  segment. 5. Atheromatous change about the carotid siphons with associated moderate multifocal narrowing, left worse than right. 6. Short-segment moderate left P2 stenosis. 7. 4 mm right upper lobe nodule, indeterminate. Per Fleischner Society Guidelines, a non-contrast Chest CT at 12 months is optional. If performed and the nodule is stable at 12 months, no further follow-up is recommended. These guidelines do not apply to immunocompromised patients and patients with cancer. Follow up in patients with significant comorbidities as clinically warranted. For lung cancer screening, adhere to Lung-RADS guidelines. Reference: Radiology. 2017; 284(1):228-43. Aortic Atherosclerosis (ICD10-I70.0) and Emphysema (ICD10-J43.9). Critical Value/emergent results were called by telephone at the time of interpretation on 08/11/2022 at 8:42 pm to provider [facility] , who verbally  acknowledged these results. Electronically Signed   By: Rise Mu M.D.   On: 08/11/2022 21:17   CT HEAD CODE STROKE WO CONTRAST  Result Date: 08/11/2022 CLINICAL DATA:  Code stroke. Initial evaluation for neuro deficit, stroke. EXAM: CT HEAD WITHOUT CONTRAST TECHNIQUE: Contiguous axial images were obtained from the base of the skull through the vertex without intravenous contrast. RADIATION DOSE REDUCTION: This exam was performed according to the departmental dose-optimization program which includes automated exposure control, adjustment of the mA and/or kV according to patient size and/or use of iterative reconstruction technique. COMPARISON:  Prior exam from 07/08/2022. FINDINGS: Brain: Age-related cerebral atrophy with moderate chronic microvascular ischemic disease. Remote right cerebellar infarct. Few small remote lacunar infarcts present about the basal adult basal bilateral basal ganglia. No acute intracranial hemorrhage. No acute large vessel territory infarct. No mass lesion, midline shift or mass effect. No hydrocephalus or  extra-axial fluid collection. Vascular: No abnormal hyperdense vessel. Calcified atherosclerosis present at the skull base. Skull: Scalp soft tissues and calvarium demonstrate no acute finding. Sinuses/Orbits: Globes and orbits soft tissues within normal limits. Paranasal sinuses are clear. Trace left mastoid effusion, of doubtful significance. Other: None. ASPECTS [facility] Stroke Program Early CT Score) - Ganglionic level infarction (caudate, lentiform nuclei, internal capsule, insula, M1-M3 cortex): 7 - Supraganglionic infarction (M4-M6 cortex): 3 Total score (0-10 with 10 being normal): 10 IMPRESSION: 1. No acute intracranial abnormality. 2. ASPECTS is 10. 3. Age-related cerebral atrophy with moderate chronic small vessel ischemic disease. Small remote right cerebellar infarct. These results were communicated to Dr. Otelia Limes at 8:20 pm on 08/11/2022 by text page via the [facility] messaging system. Electronically Signed   By: Rise Mu M.D.   On: 08/11/2022 20:21    Labs:  CBC: Recent Labs    07/08/22 1200 08/11/22 2000 08/11/22 2004 08/12/22 0045 08/12/22 0223 08/12/22 0647 08/13/22 0450  WBC 4.0 4.6  --   --   --  8.6 12.0*  HGB 12.3 14.3   < > 11.6* 12.0 12.0 10.4*  HCT 39.3 46.1*   < > 34.0* 37.6 38.0 33.2*  PLT 195 208  --   --   --  244 193   < > = values in this interval not displayed.     COAGS: Recent Labs    08/11/22 2000  INR 1.1  APTT 28     BMP: Recent Labs    07/08/22 1200 08/11/22 2000 08/11/22 2004 08/12/22 0045 08/12/22 0647 08/13/22 0450  NA 137 139 141 141 138 142  K 4.4 4.0 4.0 3.5 3.7 4.0  CL 105 105 105  --  109 113*  CO2 23 26  --   --  21* 22  GLUCOSE 89 107* 104*  --  148* 116*  BUN 20 10 11   --  9 8  CALCIUM 9.4 9.7  --   --  8.6* 8.6*  CREATININE 0.91 1.39* 1.40*  --  0.93 0.91  GFRNONAA >60 41*  --   --  >60 >60     LIVER FUNCTION TESTS: Recent Labs    07/08/22 1200 08/11/22 2000 08/13/22 0450  BILITOT 0.4 1.0  --   AST 14*  14*  --   ALT 11 9  --   ALKPHOS 48 52  --   PROT 7.0 7.0  --   ALBUMIN 3.7 3.8 3.0*     Assessment and Plan:  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 Mechanical thrombectomy performed with  direct contact aspiration with complete recanalization after one pass.  Patient extubated and doing well at this time. Right groin hematoma stable at this time, no growth in size and it remains soft and only mildly tender.   NIR to sign off at this time, no further intervention necessary.  Further treatment per Neurology and Critical Care team.  Electronically Signed: Kennieth Francois, PA-C 08/13/2022, 12:10 PM   I spent a total of 15 Minutes at the the patient's bedside AND on the patient's hospital floor or unit, greater than 50% of which was counseling/coordinating care for L MCA revascularization

## 2022-08-13 NOTE — Evaluation (Signed)
Occupational Therapy Evaluation Patient Details Name: [name] [name] MRN: [id number] DOB: 01-10-1951 Today's Date: 08/13/2022   History of Present Illness 85 female presented 6/27 with garbled speech, found to have L M3 occlusion s/p thrombectomy 6/27. Pt with increased agitation after extubation leading to R groin hematoma and reintubation, extubated 6/28.   PMH includes: CVA, DM HTN HLD Chronic pain arthritis, OSA, bilateral TKA in 2016-2017, and wears glasses.   Clinical Impression   PT admitted with CVA. Pt currently with functional limitiations due to the deficits listed below (see OT problem list). Pt demonstrate word finding deficits and recognition of incorrect words during assessment. Pt with decreased balance with peri care and reports inability to complete hygiene. Pt at baseline reports urgency and incontinence with need to wear brief. Pt will need (A) for bathing and dressing at this time if d/c home.  Pt will benefit from skilled OT to increase their independence and safety with adls and balance to allow discharge HHOT.       Recommendations for follow up therapy are one component of a multi-disciplinary discharge planning process, led by the attending physician.  Recommendations may be updated based on patient status, additional functional criteria and insurance authorization.   Assistance Recommended at Discharge    Patient can return home with the following A little help with walking and/or transfers;A little help with bathing/dressing/bathroom    Functional Status Assessment  Patient has had a recent decline in their functional status and demonstrates the ability to make significant improvements in function in a reasonable and predictable amount of time.  Equipment Recommendations  None recommended by OT    Recommendations for Other Services Speech consult     Precautions / Restrictions Precautions Precautions: Fall Precaution Comments: watch BP Restrictions Weight  Bearing Restrictions: No      Mobility Bed Mobility Overal bed mobility: Needs Assistance Bed Mobility: Supine to Sit     Supine to sit: Min guard     General bed mobility comments: increased time, use of bed rail    Transfers Overall transfer level: Needs assistance Equipment used: None Transfers: Sit to/from Stand, Bed to chair/wheelchair/BSC Sit to Stand: Min assist     Step pivot transfers: Min assist     General transfer comment: minA to rise from low surface and steady in stance. pt reaching for UE support with turning towards chair or toilet.      Balance Overall balance assessment: Needs assistance Sitting-balance support: No upper extremity supported, Feet supported Sitting balance-Leahy Scale: Fair     Standing balance support: Single extremity supported, During functional activity Standing balance-Leahy Scale: Fair                             ADL either performed or assessed with clinical judgement   ADL Overall ADL's : Needs assistance/impaired Eating/Feeding: Set up   Grooming: Set up   Upper Body Bathing: Set up   Lower Body Bathing: Maximal assistance   Upper Body Dressing : Set up   Lower Body Dressing: Maximal assistance   Toilet Transfer: Minimal assistance   Toileting- Clothing Manipulation and Hygiene: Maximal assistance Toileting - Clothing Manipulation Details (indicate cue type and reason): unable to complete peri hygiene     Functional mobility during ADLs: Minimal assistance General ADL Comments: pt at baseline using w/c and transfer minimal distances. pt reports using electric scooter in grocery store     Vision Ability to See in Adequate  Light: 0 Adequate Patient Visual Report: No change from baseline Vision Assessment?: No apparent visual deficits Additional Comments: pt reports blurred vision has resolved     Perception     Praxis      Pertinent Vitals/Pain Pain Assessment Pain Assessment: No/denies  pain     Hand Dominance Left   Extremity/Trunk Assessment Upper Extremity Assessment Upper Extremity Assessment: Overall WFL for tasks assessed   Lower Extremity Assessment Lower Extremity Assessment: Defer to PT evaluation   Cervical / Trunk Assessment Cervical / Trunk Assessment: Other exceptions Cervical / Trunk Exceptions: large body habitus   Communication Communication Communication: Expressive difficulties (intermittent word finding difficulties)   Cognition Arousal/Alertness: Awake/alert Behavior During Therapy: WFL for tasks assessed/performed Overall Cognitive Status: Difficult to assess                                 General Comments: no family present to confirm baseline, pt with intermittent word-finding deficits but able to recognize errors and correct. Pt followed instructions given in session, able to express needs.     General Comments  BP stable with noted drop initially with change of position see orthostatic vitals    Exercises     Shoulder Instructions      Home Living Family/patient expects to be discharged to:: Private residence Living Arrangements: Children;Other relatives (2 grandchildren (24 and 63)) Available Help at Discharge: Family;Available PRN/intermittently Type of Home: Apartment Home Access: Stairs to enter Entrance Stairs-Number of Steps: 1 Entrance Stairs-Rails: None Home Layout: Multi-level;Able to live on main level with bedroom/bathroom;1/2 bath on main level     Bathroom Shower/Tub: Sponge bathes at baseline   Bathroom Toilet: Standard     Home Equipment: Agricultural consultant (2 wheels);Rollator (4 wheels);Cane - single point;Wheelchair - manual      Lives With: Daughter    Prior Functioning/Environment Prior Level of Function : Driving             Mobility Comments: used cane in community, reports use of WC in the home, no falls ADLs Comments: independent bird bath as no full bath onfirst floor, pt  reports use of recliner for sleep during the day, often with feet down        OT Problem List: Decreased activity tolerance;Impaired balance (sitting and/or standing);Decreased safety awareness;Decreased knowledge of precautions;Decreased knowledge of use of DME or AE;Cardiopulmonary status limiting activity;Obesity      OT Treatment/Interventions: Self-care/ADL training;Energy conservation;DME and/or AE instruction;Manual therapy;Therapeutic activities;Balance training;Patient/family education    OT Goals(Current goals can be found in the care plan section) Acute Rehab OT Goals Patient Stated Goal: to be able to complete peri hyigene again OT Goal Formulation: With patient Time For Goal Achievement: 08/27/22 Potential to Achieve Goals: Good  OT Frequency: Min 2X/week    Co-evaluation PT/OT/SLP Co-Evaluation/Treatment: Yes Reason for Co-Treatment: Necessary to address cognition/behavior during functional activity;For patient/therapist safety;To address functional/ADL transfers   OT goals addressed during session: ADL's and self-care;Strengthening/ROM      AM-PAC OT "6 Clicks" Daily Activity     Outcome Measure Help from another person eating meals?: A Little Help from another person taking care of personal grooming?: A Little Help from another person toileting, which includes using toliet, bedpan, or urinal?: A Lot Help from another person bathing (including washing, rinsing, drying)?: A Lot Help from another person to put on and taking off regular upper body clothing?: A Little Help from another person to put  on and taking off regular lower body clothing?: A Lot 6 Click Score: 15   End of Session Equipment Utilized During Treatment: Gait belt Nurse Communication: Mobility status;Precautions  Activity Tolerance: Patient tolerated treatment well Patient left: in chair;with call bell/phone within reach;with chair alarm set  OT Visit Diagnosis: Unsteadiness on feet  (R26.81);Muscle weakness (generalized) (M62.81)                Time: 1140-1210 OT Time Calculation (min): 30 min Charges:  OT General Charges $OT Visit: 1 Visit OT Evaluation $OT Eval Moderate Complexity: 1 Mod   Brynn, OTR/L  Acute Rehabilitation Services Office: [phone] .   Mateo Flow 08/13/2022, 6:08 PM

## 2022-08-13 NOTE — Progress Notes (Addendum)
STROKE TEAM PROGRESS NOTE   BRIEF HPI [name] is a 72 y.o. female with history of arthritis, right basal ganglia stroke, diabetes, hypertension, hyperlipidemia, obesity and sleep apnea presenting with acute onset aphasia with right visual field cut.  She presented outside the window for TNK.  She was found to have a left M3 occlusion with 23 mm perfusion deficit, no core and was taken to IR for mechanical thrombectomy.  This procedure was successful with TICI 2c flow achieved.  Patient was extubated postprocedure, but she then became very agitated and combative and developed a hematoma in the groin.  She was then reintubated.   SIGNIFICANT HOSPITAL EVENTS Mechanical thrombectomy 6/26 6/27- extubated   INTERM HISTORY/SUBJECTIVE Extubated yesterday, some low BP Follows with [facility] for cardiology Aphasia is improving, speech is not fluent   OBJECTIVE  CBC    Component Value Date/Time   WBC 12.0 (H) 08/13/2022 0450   RBC 3.98 08/13/2022 0450   HGB 10.4 (L) 08/13/2022 0450   HGB 12.5 03/03/2018 1142   HCT 33.2 (L) 08/13/2022 0450   HCT 40.6 03/03/2018 1142   PLT 193 08/13/2022 0450   PLT 297 03/03/2018 1142   MCV 83.4 08/13/2022 0450   MCV 74 (L) 03/03/2018 1142   MCH 26.1 08/13/2022 0450   MCHC 31.3 08/13/2022 0450   RDW 16.4 (H) 08/13/2022 0450   RDW 16.6 (H) 03/03/2018 1142   LYMPHSABS 1.3 08/11/2022 2000   MONOABS 0.5 08/11/2022 2000   EOSABS 0.0 08/11/2022 2000   BASOSABS 0.0 08/11/2022 2000    BMET    Component Value Date/Time   NA 142 08/13/2022 0450   NA 144 03/03/2018 1142   K 4.0 08/13/2022 0450   CL 113 (H) 08/13/2022 0450   CO2 22 08/13/2022 0450   GLUCOSE 116 (H) 08/13/2022 0450   BUN 8 08/13/2022 0450   BUN 10 03/03/2018 1142   CREATININE 0.91 08/13/2022 0450   CREATININE 0.71 01/01/2016 1617   CALCIUM 8.6 (L) 08/13/2022 0450   GFRNONAA >60 08/13/2022 0450    IMAGING past 24 hours ECHOCARDIOGRAM COMPLETE  Result Date: 08/12/2022     ECHOCARDIOGRAM REPORT   Patient Name:   [name] [name] Date of Exam: 08/12/2022 Medical Rec #:  [id number]       Height:       64.0 in Accession #:    [id number]      Weight:       290.8 lb Date of Birth:  Jul 02, 1950       BSA:          2.294 m Patient Age:    72 years        BP:           134/47 mmHg Patient Gender: F               HR:           84 bpm. Exam Location:  Inpatient Procedure: 2D Echo, Cardiac Doppler, Color Doppler and Intracardiac            Opacification Agent Indications:    Stroke I63.9  History:        Patient has prior history of Echocardiogram examinations, most                 recent 10/06/2017. Stroke; Risk Factors:Hypertension,                 Dyslipidemia and Former Smoker.  Sonographer:    Dondra Prader RVT RCS  Referring Phys: [id number] ERIC LINDZEN  Sonographer Comments: Suboptimal apical window. 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 IMPRESSIONS  1. Left ventricular ejection fraction, by estimation, is 70 to 75%. The left ventricle has hyperdynamic function. The left ventricle has no regional wall motion abnormalities. There is moderate concentric left ventricular hypertrophy. Left ventricular diastolic parameters are consistent with Grade I diastolic dysfunction (impaired relaxation).  2. Right ventricular systolic function is normal. The right ventricular size is normal.  3. The mitral valve is grossly normal. Trivial mitral valve regurgitation. No evidence of mitral stenosis.  4. The aortic valve was not well visualized. Aortic valve regurgitation is not visualized.  5. The inferior vena cava is normal in size with greater than 50% respiratory variability, suggesting right atrial pressure of 3 mmHg. Comparison(s): No significant change from prior study. Conclusion(s)/Recommendation(s): No intracardiac source of embolism detected on this transthoracic study. Consider a transesophageal echocardiogram to exclude cardiac source of embolism if clinically indicated. FINDINGS   Left Ventricle: Left ventricular ejection fraction, by estimation, is 70 to 75%. The left ventricle has hyperdynamic function. The left ventricle has no regional wall motion abnormalities. Definity contrast agent was given IV to delineate the left ventricular endocardial borders. The left ventricular internal cavity size was normal in size. There is moderate concentric left ventricular hypertrophy. Left ventricular diastolic parameters are consistent with Grade I diastolic dysfunction (impaired relaxation). Right Ventricle: The right ventricular size is normal. No increase in right ventricular wall thickness. Right ventricular systolic function is normal. Left Atrium: Left atrial size was normal in size. Right Atrium: Right atrial size was normal in size. Pericardium: There is no evidence of pericardial effusion. Mitral Valve: The mitral valve is grossly normal. Mild to moderate mitral annular calcification. Trivial mitral valve regurgitation. No evidence of mitral valve stenosis. Tricuspid Valve: The tricuspid valve is normal in structure. Tricuspid valve regurgitation is trivial. Aortic Valve: The aortic valve was not well visualized. Aortic valve regurgitation is not visualized. Pulmonic Valve: The pulmonic valve was normal in structure. Pulmonic valve regurgitation is trivial. Aorta: The aortic root is normal in size and structure. Venous: The inferior vena cava is normal in size with greater than 50% respiratory variability, suggesting right atrial pressure of 3 mmHg. IAS/Shunts: The atrial septum is grossly normal.  LEFT VENTRICLE           + PLAX 2D LVIDd:         4.05 cm LVIDs:         2.50 cm   Diastology LV PW:         1.25 cm   LV e' medial:    4.35 cm/s LV IVS:        1.40 cm   LV E/e' medial:  17.8 LVOT diam:     2.00 cm   LV e' lateral:   5.22 cm/s LV SV:         69        LV E/e' lateral: 14.9 LV SV Index:   30 LVOT Area:     3.14 cm  RIGHT VENTRICLE             IVC RV S prime:     13.80 cm/s  IVC  diam: 1.60 cm TAPSE (M-mode): 1.8 cm LEFT ATRIUM             Index        RIGHT ATRIUM           Index LA diam:  3.50 cm 1.53 cm/m   RA Area:     14.30 cm LA Vol (A2C):   32.9 ml 14.34 ml/m  RA Volume:   30.80 ml  13.43 ml/m LA Vol (A4C):   25.0 ml 10.90 ml/m LA Biplane Vol: 30.0 ml 13.08 ml/m  AORTIC VALVE LVOT Vmax:   106.00 cm/s LVOT Vmean:  66.900 cm/s LVOT VTI:    0.220 m  AORTA Ao Root diam: 2.80 cm Ao Asc diam:  2.20 cm MITRAL VALVE               TRICUSPID VALVE MV Area (PHT): 2.73 cm    TR Peak grad:   11.0 mmHg MV Decel Time: 278 msec    TR Vmax:        166.00 cm/s MV E velocity: 77.60 cm/s MV A velocity: 87.00 cm/s  SHUNTS MV E/A ratio:  0.89        Systemic VTI:  0.22 m                            Systemic Diam: 2.00 cm Laurance Flatten MD Electronically signed by Laurance Flatten MD Signature Date/Time: 08/12/2022/1:46:15 PM    Final    MR BRAIN WO CONTRAST  Result Date: 08/12/2022 CLINICAL DATA:  72 year old female code stroke presentation yesterday with left MCA branch occlusion status post endovascular revascularization. EXAM: MRI HEAD WITHOUT CONTRAST MRA HEAD WITHOUT CONTRAST TECHNIQUE: Multiplanar, multi-echo pulse sequences of the brain and surrounding structures were acquired without intravenous contrast. Angiographic images of the Circle of Willis were acquired using MRA technique without intravenous contrast. COMPARISON:  CTA head and neck yesterday. Previous brain MRI 10/06/2017. FINDINGS: MRI HEAD FINDINGS Brain: Punctate diffusion restriction in the left caudate nucleus suspected on series 5, image 74. And mild scattered posterior left MCA, left MCA/PCA watershed cortical and white matter diffusion restriction (series 5, images 72 and 75). Mild associated cytotoxic edema. No hemorrhagic transformation; underlying chronic microhemorrhage in the left occipital lobe white matter series 16, image 28 on [facility]. No contralateral right hemisphere or posterior fossa restricted  diffusion. 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 Chronic right basal ganglia lacunar infarcts are new since that time. Widely scattered patchy bilateral cerebral white matter T2 and FLAIR hyperintensity otherwise not significantly changed. No midline shift, mass effect, evidence of mass lesion, ventriculomegaly, extra-axial collection or acute intracranial hemorrhage. Cervicomedullary junction and pituitary are within normal limits. Vascular: Major intracranial vascular flow voids appear stable. Skull and upper cervical spine: Negative for age visible cervical spine. Visualized bone marrow signal is within normal limits. Sinuses/Orbits: Negative orbits. Paranasal sinus mucosal thickening and fluid in the setting of intubation. Other: Fluid in the nasal cavity and nasopharynx in the setting of intubation. Mastoids remain well aerated. MRA HEAD FINDINGS Anterior circulation: Antegrade flow in both ICA siphons. Left greater than right ICA siphon irregularity. Patent carotid termini, MCA and right ACA origins. Left ACA A1 irregularity and stenosis is stable on series 1065, image 11. Other visible ACA branches are within normal limits. Right MCA M1 segment and visible right MCA branches are stable, including moderate posterior division irregularity and stenosis on series 1071, image 15). Left MCA M1 segment remains patent with mild distal M1 stenosis (series 1071, image 10). Left MCA branches appear more numerous compared to the CTA yesterday. There is residual moderate to severe tandem a regularity and stenosis in the most posterior division on series 1071, image 6. Posterior  circulation: Antegrade flow in the posterior circulation. Dominant distal left vertebral artery, vertebrobasilar junction and basilar artery appear stable. Bilateral PICA, SCA and PCA origins remain patent. Posterior communicating arteries are diminutive or absent. Bilateral PCA branches appear stable. Anatomic variants:  Dominant left vertebral artery. IMPRESSION: 1. Intracranial atherosclerosis. Improved appearance of Left MCA posterior division branches following endovascular reperfusion. 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 2. Mild, scattered small acute infarcts in the Left MCA and Left MCA/PCA watershed territories. No hemorrhagic transformation or mass effect. 3. No new intracranial abnormality. 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 Electronically Signed   By: Odessa Fleming M.D.   On: 08/12/2022 10:24   MR ANGIO HEAD WO CONTRAST  Result Date: 08/12/2022 CLINICAL DATA:  72 year old female code stroke presentation yesterday with left MCA branch occlusion status post endovascular revascularization. EXAM: MRI HEAD WITHOUT CONTRAST MRA HEAD WITHOUT CONTRAST TECHNIQUE: Multiplanar, multi-echo pulse sequences of the brain and surrounding structures were acquired without intravenous contrast. Angiographic images of the Circle of Willis were acquired using MRA technique without intravenous contrast. COMPARISON:  CTA head and neck yesterday. Previous brain MRI 10/06/2017. FINDINGS: MRI HEAD FINDINGS Brain: Punctate diffusion restriction in the left caudate nucleus suspected on series 5, image 74. And mild scattered posterior left MCA, left MCA/PCA watershed cortical and white matter diffusion restriction (series 5, images 72 and 75). Mild associated cytotoxic edema. No hemorrhagic transformation; underlying chronic microhemorrhage in the left occipital lobe white matter series 16, image 28 on SWI. No contralateral right hemisphere or posterior fossa restricted diffusion. 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 Chronic right basal ganglia lacunar infarcts are new since that time. Widely scattered patchy bilateral cerebral white matter T2 and FLAIR hyperintensity otherwise not significantly  changed. No midline shift, mass effect, evidence of mass lesion, ventriculomegaly, extra-axial collection or acute intracranial hemorrhage. Cervicomedullary junction and pituitary are within normal limits. Vascular: Major intracranial vascular flow voids appear stable. Skull and upper cervical spine: Negative for age visible cervical spine. Visualized bone marrow signal is within normal limits. Sinuses/Orbits: Negative orbits. Paranasal sinus mucosal thickening and fluid in the setting of intubation. Other: Fluid in the nasal cavity and nasopharynx in the setting of intubation. Mastoids remain well aerated. MRA HEAD FINDINGS Anterior circulation: Antegrade flow in both ICA siphons. Left greater than right ICA siphon irregularity. Patent carotid termini, MCA and right ACA origins. Left ACA A1 irregularity and stenosis is stable on series 1065, image 11. Other visible ACA branches are within normal limits. Right MCA M1 segment and visible right MCA branches are stable, including moderate posterior division irregularity and stenosis on series 1071, image 15). Left MCA M1 segment remains patent with mild distal M1 stenosis (series 1071, image 10). Left MCA branches appear more numerous compared to the CTA yesterday. There is residual moderate to severe tandem a regularity and stenosis in the most posterior division on series 1071, image 6. Posterior circulation: Antegrade flow in the posterior circulation. Dominant distal left vertebral artery, vertebrobasilar junction and basilar artery appear stable. Bilateral PICA, SCA and PCA origins remain patent. Posterior communicating arteries are diminutive or absent. Bilateral PCA branches appear stable. Anatomic variants: Dominant left vertebral artery. IMPRESSION: 1. Intracranial atherosclerosis. Improved appearance of Left MCA posterior division branches following endovascular reperfusion. 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 2. Mild, scattered small  acute infarcts in the Left MCA and Left MCA/PCA watershed  territories. No hemorrhagic transformation or mass effect. 3. No new intracranial abnormality. 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 Electronically Signed   By: Odessa Fleming M.D.   On: 08/12/2022 10:24    Vitals:   08/13/22 0900 08/13/22 0915 08/13/22 0928 08/13/22 0930  BP: (!) 152/55 (!) 122/49  (!) 123/45  Pulse: 81 83  76  Resp: 12 19  15   Temp:      TempSrc:      SpO2: 96% 98% 91% 98%  Weight:      Height:         Physical Exam  Constitutional: Appears well-developed and well-nourished.   Cardiovascular: Normal rate and regular rhythm.  Respiratory: Effort normal, non-labored breathing, on 1L [place]- titrate off  Neuro: Mental Status: Patient is awake, alert, oriented to person, place, month, year, and situation. Patient is able to give a clear and coherent history. Speech is not fluent, but no aphasia Cranial Nerves: II: Visual Fields are full. Pupils are equal, round, and reactive to light.   III,IV, VI: EOMI without ptosis or diploplia.  V: Facial sensation is symmetric to temperature VII: Facial movement is symmetric resting and smiling VIII: Hearing is intact to voice X: Palate elevates symmetrically XI: Shoulder shrug is symmetric. XII: Tongue protrudes midline without atrophy or fasciculations.  Motor: Tone is normal. Bulk is normal. 5/5 strength present in bilateral upper extremities  4/5 strength in bilateral lower extremities Sensory: Sensation is symmetric to light touch in the arms and legs. Cerebellar: FNF intact bilaterally   ASSESSMENT/PLAN  Stroke:  left MCA scattered infarct due to left M3 occlusion s/p IR with TICI2c, etiology: Large vessel disease with left carotid stenosis Code Stroke CT head No acute abnormality. Small vessel disease. Atrophy. ASPECTS 10.    CTA head & neck acute M3 stump  occlusion and inferior division, occlusion of right vertebral artery at its origin with moderate stenoses of left vertebral artery, short segment moderate left P2 stenosis. Bulky calcified plaque about the carotid bifurcations/proximal ICAs with associated stenoses of up to 75% on the left and 50% on the right. Atheromatous change about the carotid siphons with associated moderate multifocal narrowing, left worse than right CT perfusion no core infarct, 23 mm perfusion deficit at posterior left M3 distribution IR left M3 occlusion s/p TICI2c with left M4 slow flow. Left ICA 35% stenosis, irregular plaque MRI scattered small acute infarcts in left MCA and left MCA/PCA watershed territories with no hemorrhagic transformation MRA improved appearance of left MCA posterior division branches and residual moderate to severe posterior M2 tandem stenosis 2D Echo EF 70 to 75% LDL 82 HgbA1c 5.8 VTE prophylaxis - lovenox clopidogrel 75 mg daily prior to admission, now on aspirin 81 mg daily and clopidogrel 75 mg daily for 3 months given intracranial stenosis and then Plavix alone Therapy recommendations: HH PT Disposition: Pending  Carotid stenosis CTA head & neck Bulky calcified plaque about the carotid bifurcations/proximal ICAs with associated stenoses of up to 75% on the left and 50% on the right. Atheromatous change about the carotid siphons with associated moderate multifocal narrowing, left worse than right IR angiogram showed left ICA stenosis is approximately 35% with irregular plaque No intervention warranted at this time On DAPT  Hx of Stroke/TIA 09/2017 CT showed old left BG lacunar infarct. MRI showed right BG infarct. MRA right V4 stenosis.  Carotid Doppler negative.  EF 55 to 60%, LDL  117, A1c 6.2.  Discharged on DAPT and Lipitor 80.  Right groin hematoma Patient developed right groin hematoma after mechanical thrombectomy Manual pressure held Ultrasound reveals no  pseudoaneurysm  History of hypertension  currently hypotensive Home meds: Minoxidil 5 mg daily, carvedilol 25 mg twice daily, spironolactone 25 mg daily, valsartan 320 mg daily Now off pressor  On midodrine 5 mg tid SBP goal less than 180 Long term BP goal 130-150 given intra- and extracranial ICA stenosis follows with [facility] Dr Jodie Echevaria  Hyperlipidemia Home meds: None LDL 82, goal < 70 Rosuvastatin 20 mg daily Continue statin at discharge  Diabetes type II  Home meds: None HgbA1c 5.8, goal < 7.0 CBGs  Dysphagia  NPO- SLP eval pending  On IVF, will start TF Speech eval after extubation  Other Stroke Risk Factors Obesity, Body mass index is 49.91 kg/m., BMI >/= 30 associated with increased stroke risk, recommend weight loss, diet and exercise as appropriate  Obstructive sleep apnea  Other Active Problems Right upper lung nodule-recommend noncontrast chest CT in 12 months UA WBC >50, no antibiotic needed at this time per pharmacy.  Hospital day # 2  Patient seen and examined by NP/APP with MD. MD to update note as needed.   Elmer Picker, DNP, FNP-BC [facility] Pager: [phone]   ATTENDING NOTE: I reviewed above note and agree with the assessment and plan. Pt was seen and examined.   RN at bedside.  Patient awake alert, orientated to place, situation and age, orientated to year but perseverated to year when asked about month.  No significant aphasia, however occasionally nonfluent with paraphasic errors.  However, able to name and repeat and read.  Visual field full, no gaze palsy, slight right nasolabial fold flattening.  Moving all extremities 5/5 upper extremities and 4/5 lower extremities.  Sensation symmetrical and finger-nose intact bilaterally.  Etiology for patient's stroke likely large vessel disease, including left ICA bulb and siphon stenosis.  Now on DAPT for 3 months and then Plavix alone.  Continue statin.  BP improved, off pressor, put on  midodrine.  Long-term goal 1 30-1 50 given large vessel stenosis.  PT and OT recommend home health.  For detailed assessment and plan, please refer to above/below as I have made changes wherever appropriate.   Marvel Plan, MD PhD Stroke Neurology 08/13/2022 5:53 PM  This patient is critically ill due to left MCA stroke status post thrombectomy, left ICA stenosis, respiratory failure, hypotension on pressor and at significant risk of neurological worsening, death form recurrent stroke, hemorrhagic transformation, seizure, aspiration. This patient's care requires constant monitoring of vital signs, hemodynamics, respiratory and cardiac monitoring, review of multiple databases, neurological assessment, discussion with family, other specialists and medical decision making of high complexity. I spent 35 minutes of neurocritical care time in the care of this patient.   To contact Stroke Continuity provider, please refer to [email or web address]. After hours, contact General Neurology

## 2022-08-13 NOTE — Progress Notes (Signed)
NAME:  [name] [name], MRN:  [id number], DOB:  1950-12-11, LOS: 2 ADMISSION DATE:  08/11/2022, CONSULTATION DATE:  6/27 REFERRING MD:  Dr. Otelia Limes, CHIEF COMPLAINT:  Stroke   History of Present Illness:  Patient is encephalopathic and/or intubated; therefore, history has been obtained from chart review.  72 year old female with past medical history as below, which is significant for former stroke, diabetes mellitus, hypertension, hyperlipidemia, chronic pain.  She presented to [facility] emergency department on 6/26 via EMS with complaints of garbled speech.  Last known well 1 PM.  Code stroke was called upon arrival to the emergency department.  CT of the head was negative.  She was outside the window for systemic thrombolytics.  CT angiogram demonstrated a left M3 occlusion.  Based on the CT perfusion study she was deemed to be candidate for VIR.  She underwent successful mechanical thrombectomy with TICI 2c result.  Postoperatively she was extubated but became quite agitated ultimately requiring reintubation.  She was transferred to the ICU where PCCM was asked to assist with her care.   Pertinent  Medical History   has a past medical history of Arthritis, CVA (cerebral vascular accident) (HCC), Diabetes (HCC), Hyperlipidemia, Hypertension, Obesity, OSA (obstructive sleep apnea), Overactive bladder, S/P right knee arthroscopy, Septic arthritis of knee, right (HCC), and Wears glasses.   Significant Hospital Events: Including procedures, antibiotic start and stop dates in addition to other pertinent events   6/27 admit, extubated post procedure but then reintubated for agitation 6/28 extubated  Interim History / Subjective:  Extubated successfully. On 20 Neo, MAP 76. Asking for food.  Objective   Blood pressure (!) 134/53, pulse 72, temperature (!) 97.5 F (36.4 C), temperature source Oral, resp. rate 16, height 5\' 4"  (1.626 m), weight 131.9 kg, SpO2 100 %.    Vent Mode: PSV;CPAP FiO2  (%):  [24 %-40 %] 24 % PEEP:  [5 cmH20] 5 cmH20 Pressure Support:  [8 cmH20] 8 cmH20 Plateau Pressure:  [13 cmH20] 13 cmH20   Intake/Output Summary (Last 24 hours) at 08/13/2022 0751 Last data filed at 08/13/2022 0700 Gross per 24 hour  Intake 3854.16 ml  Output 1665 ml  Net 2189.16 ml    Filed Weights   08/11/22 2004 08/11/22 2035 08/12/22 0032  Weight: 130.2 kg 130.2 kg 131.9 kg    Examination: General: obese elderly female, in NAD HENT: [place]/AT, EOMI, no JVD Lungs: Clear bilateral breath sounds Cardiovascular: RRR, no MRG Abdomen: Soft, NT Extremities: No deformities, no edema. R groin hematoma stable Neuro: Awake, Alert, MAE GU: Foley  Assessment & Plan:   CVA: Left M3 stump occlusion s/p thrombectomy 6/27 - Management per stroke service - Repeat imaging per neuro - Keep SBP 120-140 mmHg - PT/OT  Hypotension - currently on low dose phenylephrine - Wean phenylephrine to off as able - Push PO/fluids  R groin hematoma - stable - Continue supportive care  DM - CBG monitoring and SSI  HTN dCHF - echo this admit with EF 70-75%, G1DD - holding home medications for now  Nutrition - SLP eval pending - Resume per tube meds via oral route after swallow eval, if fails then will need cortrak but anticipate she'll do fine with swallow    Best Practice (right click and "Reselect all SmartList Selections" daily)   Diet/type: NPO, SLP eval pending DVT prophylaxis: SCD GI prophylaxis: H2B Lines: N/A Foley:  Yes, and it is still needed Code Status:  full code Last date of multidisciplinary goals of care discussion [unable  to contact family]  Critical care time: 30 minutes    Rutherford Guys, [place] - [facility] [place] [facility] For pager details, please see AMION or use Epic chat  After 1900, please call [facility] for cross coverage needs 08/13/2022, 8:00 AM

## 2022-08-13 NOTE — Evaluation (Signed)
Clinical/Bedside Swallow Evaluation Patient Details  Name: [name] [name] MRN: [id number] Date of Birth: Aug 21, 1950  Today's Date: 08/13/2022 Time: SLP Start Time (ACUTE ONLY): 1037 SLP Stop Time (ACUTE ONLY): 1037 SLP Time Calculation (min) (ACUTE ONLY): 12 min  Past Medical History:  Past Medical History:  Diagnosis Date   Arthritis    "right knee" (07/02/2015)   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   Diabetes (HCC)    type II   Hyperlipidemia    Hypertension    Obesity    OSA (obstructive sleep apnea)    Overactive bladder    S/P right knee arthroscopy    with septic joint growing out MSSA   Septic arthritis of knee, right (HCC)    Wears glasses    Past Surgical History:  Past Surgical History:  Procedure Laterality Date   BIOPSY  08/05/2020   Procedure: BIOPSY;  Surgeon: Kathi Der, MD;  Location: [facility];  Service: Gastroenterology;;   COLONOSCOPY     COLONOSCOPY WITH PROPOFOL N/A 08/05/2020   Procedure: COLONOSCOPY WITH PROPOFOL;  Surgeon: Kathi Der, MD;  Location: [facility];  Service: Gastroenterology;  Laterality: N/A;   DIAGNOSTIC LAPAROSCOPY     IR CT HEAD LTD  08/12/2022   IR PERCUTANEOUS ART THROMBECTOMY/INFUSION INTRACRANIAL INC DIAG ANGIO  08/12/2022   IR US GUIDE VASC ACCESS RIGHT  08/12/2022   IR US GUIDE VASC ACCESS RIGHT  08/12/2022   JOINT REPLACEMENT     KNEE ARTHROSCOPY Right 12/09/2012   Procedure: ARTHROSCOPIC IRRIGATION AND DEBRIDEMENT RIGHT KNEE;  Surgeon: Budd Palmer, MD;  Location: [facility] OR;  Service: Orthopedics;  Laterality: Right;   LEFT HEART CATH AND CORONARY ANGIOGRAPHY N/A 09/02/2016   Procedure: Left Heart Cath and Coronary Angiography;  Surgeon: Rinaldo Cloud, MD;  Location: [facility] INVASIVE CV LAB;  Service: Cardiovascular;  Laterality: N/A;   RADIOLOGY WITH ANESTHESIA N/A 08/11/2022   Procedure: IR WITH ANESTHESIA;  Surgeon: Julieanne Cotton, MD;  Location: [facility] OR;  Service: Radiology;  Laterality:  N/A;   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/06/2014   Procedure: PLACEMENT OF CEMENT SPACER;  Surgeon: Loreta Ave, MD;  Location: [facility] OR;  Service: Orthopedics;  Laterality: Right;   TOTAL KNEE ARTHROPLASTY Bilateral 2011-2012   left-right   TOTAL KNEE REVISION Right 11/06/2014   Procedure: REMOVAL OF TOTAL COMPONENTS, EXTENSIVE  IRRIGATION AND DEBRIDEMENT  RIGHT KNEE;  Surgeon: Loreta Ave, MD;  Location: [facility] OR;  Service: Orthopedics;  Laterality: Right;   TOTAL KNEE REVISION Right 07/02/2015   TOTAL KNEE REVISION Right 07/02/2015   Procedure: TOTAL KNEE REVISION;  Surgeon: Loreta Ave, MD;  Location: [facility] OR;  Service: Orthopedics;  Laterality: Right;   TUBAL LIGATION  ~ 1980   tummy tuck  1980s   HPI:  Pt is a 72 y.o. female who presented secondary to acute onset of garbled speech. CTA 6/26: Acute left M3 stump occlusion, inferior division; pt s/p mechanical thrombectomy 6/26. ETT 6/26-6/27. Pt failed the [facility] since she stopped drinking. CT head 6/26 negative for acute changes; MRI brain 6/27: mild, scattered small acute infarcts in the Left MCA and Left  MCA/PCA watershed territories. PMH: arthritis, right basal ganglia stroke, DM2, HLD, HTN, obesity and OSA.    Assessment / Plan / Recommendation  Clinical Impression  Pt was seen for bedside swallow evaluation and she denied a history of dysphagia. Oral mechanism exam was significant for mildly reduced right-sided facial movement and dentition was reduced.  She tolerated all solids and liquids without signs or symptoms of oropharyngeal dysphagia. A regular texture diet with thin liquids is recommended at this time and SLP will follow briefly to ensure tolerance. SLP Visit Diagnosis: Dysphagia, unspecified (R13.10)    Aspiration Risk  No limitations    Diet Recommendation Regular;Thin liquid    Liquid Administration via: Straw;Cup Medication Administration: Whole meds with liquid Postural Changes: Seated upright at 90  degrees    Other  Recommendations Oral Care Recommendations: Oral care BID    Recommendations for follow up therapy are one component of a multi-disciplinary discharge planning process, led by the attending physician.  Recommendations may be updated based on patient status, additional functional criteria and insurance authorization.  Follow up Recommendations  (TBD)      Assistance Recommended at Discharge    Functional Status Assessment    Frequency and Duration min 1 x/week  1 week       Prognosis Prognosis for improved oropharyngeal function: Good Barriers to Reach Goals: Language deficits      Swallow Study   General Date of Onset: 08/12/22 HPI: Pt is a 72 y.o. female who presented secondary to acute onset of garbled speech. CTA 6/26: Acute left M3 stump occlusion, inferior division; pt s/p mechanical thrombectomy 6/26. ETT 6/26-6/27. Pt failed the Yale since she stopped drinking. CT head 6/26 negative for acute changes; MRI brain 6/27: mild, scattered small acute infarcts in the Left MCA and Left  MCA/PCA watershed territories. PMH: arthritis, right basal ganglia stroke, DM2, HLD, HTN, obesity and OSA. Type of Study: Bedside Swallow Evaluation Previous Swallow Assessment: none Diet Prior to this Study: NPO Temperature Spikes Noted: No Respiratory Status: Nasal cannula History of Recent Intubation: No Behavior/Cognition: Alert;Cooperative;Pleasant mood Oral Cavity Assessment: Within Functional Limits Oral Care Completed by SLP: No Oral Cavity - Dentition: Adequate natural dentition Vision: Functional for self-feeding Self-Feeding Abilities: Able to feed self Patient Positioning: Upright in bed Baseline Vocal Quality: Normal Volitional Cough: Strong Volitional Swallow: Able to elicit    Oral/Motor/Sensory Function Overall Oral Motor/Sensory Function: Mild impairment Facial Symmetry: Abnormal symmetry right;Suspected CN VII (facial) dysfunction   Ice Chips Ice chips:  Within functional limits Presentation: Spoon Other Comments: `   Thin Liquid Thin Liquid: Within functional limits Presentation: Straw    Nectar Thick Nectar Thick Liquid: Not tested   Honey Thick Honey Thick Liquid: Not tested   Puree Puree: Within functional limits Presentation: Spoon   Solid     Solid: Within functional limits Presentation: Self Fed     Doshia Dalia I. Vear Clock, MS, CCC-SLP Neuro Diagnostic Specialist  Acute Rehabilitation Services Office number: [phone]  Scheryl Marten 08/13/2022,12:02 PM

## 2022-08-13 NOTE — Progress Notes (Signed)
0328 CBG resulted at 67 mg/dL. Hypoglycemia order set placed and given 12.5g of D50 per protocol. CBG checked 15 mins after medication administration and resulted 112 mg/dL.

## 2022-08-14 DIAGNOSIS — I10 Essential (primary) hypertension: Secondary | ICD-10-CM

## 2022-08-14 DIAGNOSIS — I6300 Cerebral infarction due to thrombosis of unspecified precerebral artery: Secondary | ICD-10-CM

## 2022-08-14 LAB — CBC
HCT: 31.6 % — ABNORMAL LOW (ref 36.0–46.0)
Hemoglobin: 9.8 g/dL — ABNORMAL LOW (ref 12.0–15.0)
MCH: 25.7 pg — ABNORMAL LOW (ref 26.0–34.0)
MCHC: 31 g/dL (ref 30.0–36.0)
MCV: 82.9 fL (ref 80.0–100.0)
Platelets: 174 10*3/uL (ref 150–400)
RBC: 3.81 MIL/uL — ABNORMAL LOW (ref 3.87–5.11)
RDW: 16.8 % — ABNORMAL HIGH (ref 11.5–15.5)
WBC: 5.5 10*3/uL (ref 4.0–10.5)
nRBC: 0 % (ref 0.0–0.2)

## 2022-08-14 LAB — GLUCOSE, CAPILLARY
Glucose-Capillary: 88 mg/dL (ref 70–99)
Glucose-Capillary: 91 mg/dL (ref 70–99)
Glucose-Capillary: 87 mg/dL (ref 70–99)

## 2022-08-14 LAB — RENAL FUNCTION PANEL
Albumin: 3.4 g/dL — ABNORMAL LOW (ref 3.5–5.0)
Anion gap: 7 (ref 5–15)
BUN: 7 mg/dL — ABNORMAL LOW (ref 8–23)
CO2: 23 mmol/L (ref 22–32)
Calcium: 8.9 mg/dL (ref 8.9–10.3)
Chloride: 111 mmol/L (ref 98–111)
Creatinine, Ser: 0.7 mg/dL (ref 0.44–1.00)
GFR, Estimated: >60 mL/min (ref >60)
Glucose, Bld: 91 mg/dL (ref 70–99)
Phosphorus: 2.2 mg/dL — ABNORMAL LOW (ref 2.5–4.6)
Potassium: 3.4 mmol/L — ABNORMAL LOW (ref 3.5–5.1)
Sodium: 141 mmol/L (ref 135–145)

## 2022-08-14 MED ORDER — CARVEDILOL 12.5 MG PO TABS
25.0000 mg | ORAL_TABLET | Freq: Two times a day (BID) | ORAL | Status: DC
  Administered 2022-08-14 – 2022-08-15 (×2): 25 mg via ORAL
  Filled 2022-08-14 (×2): qty 2

## 2022-08-14 MED ORDER — IRBESARTAN 75 MG PO TABS: 37.5000 mg | ORAL_TABLET | Freq: Every day | ORAL | Status: DC

## 2022-08-14 MED ORDER — CARVEDILOL 3.125 MG PO TABS: 6.2500 mg | ORAL_TABLET | Freq: Two times a day (BID) | ORAL | Status: DC

## 2022-08-14 MED ORDER — CARVEDILOL 12.5 MG PO TABS: 50.0000 mg | ORAL_TABLET | Freq: Two times a day (BID) | ORAL | Status: DC

## 2022-08-14 MED ORDER — HYDRALAZINE HCL 20 MG/ML IJ SOLN
20.0000 mg | INTRAMUSCULAR | Status: DC | PRN
  Administered 2022-08-14 (×3): 20 mg via INTRAVENOUS
  Filled 2022-08-14 (×3): qty 1

## 2022-08-14 MED ORDER — IRBESARTAN 75 MG PO TABS
37.5000 mg | ORAL_TABLET | Freq: Every day | ORAL | Status: DC
  Administered 2022-08-14 – 2022-08-15 (×2): 37.5 mg via ORAL
  Filled 2022-08-14 (×2): qty 0.5

## 2022-08-14 MED ORDER — MINOXIDIL 10 MG PO TABS
10.0000 mg | ORAL_TABLET | Freq: Every day | ORAL | Status: DC
  Administered 2022-08-14 – 2022-08-15 (×2): 10 mg via ORAL
  Filled 2022-08-14 (×3): qty 1

## 2022-08-14 MED ORDER — AMLODIPINE BESYLATE 5 MG PO TABS
5.0000 mg | ORAL_TABLET | Freq: Every day | ORAL | Status: DC
  Administered 2022-08-14 – 2022-08-15 (×2): 5 mg via ORAL
  Filled 2022-08-14 (×2): qty 1

## 2022-08-14 MED ORDER — HYDRALAZINE HCL 20 MG/ML IJ SOLN
10.0000 mg | INTRAMUSCULAR | Status: DC | PRN
  Administered 2022-08-14: 10 mg via INTRAVENOUS
  Filled 2022-08-14: qty 1

## 2022-08-14 MED ORDER — CARVEDILOL 12.5 MG PO TABS: 25.0000 mg | ORAL_TABLET | Freq: Two times a day (BID) | ORAL | Status: DC

## 2022-08-14 MED ORDER — SPIRONOLACTONE 25 MG PO TABS
25.0000 mg | ORAL_TABLET | Freq: Every day | ORAL | Status: DC
  Administered 2022-08-14 – 2022-08-15 (×2): 25 mg via ORAL
  Filled 2022-08-14 (×2): qty 1

## 2022-08-14 NOTE — Progress Notes (Addendum)
STROKE TEAM PROGRESS NOTE   BRIEF HPI [name] is a 72 y.o. female with history of arthritis, right basal ganglia stroke, diabetes, hypertension, hyperlipidemia, obesity and sleep apnea presented on 6/26 with acute onset aphasia with right visual field cut.  She presented outside the window for TNK.  She was found to have a left M3 occlusion with 23 mm perfusion deficit, no core and was taken to IR for mechanical thrombectomy.  This procedure was successful with TICI 2c flow achieved.  Patient was extubated postprocedure, but she then became very agitated and combative and developed a hematoma in the groin. Hematoma has resolved. Extubated 6/27 and transferred out of ICU.   SIGNIFICANT HOSPITAL EVENTS 6/26- Mechanical thrombectomy  6/27- extubated  6/28- off pressors, transfer orders placed  INTERM HISTORY/SUBJECTIVE Will need a 30 day heart monitor. Ambulating in hallway with PT/OT. BP is still elevated, medications adjusted and verified with pharmacy.  Neurologically stable.   OBJECTIVE  CBC    Component Value Date/Time   WBC 5.5 08/14/2022 0231   RBC 3.81 (L) 08/14/2022 0231   HGB 9.8 (L) 08/14/2022 0231   HGB 12.5 03/03/2018 1142   HCT 31.6 (L) 08/14/2022 0231   HCT 40.6 03/03/2018 1142   PLT 174 08/14/2022 0231   PLT 297 03/03/2018 1142   MCV 82.9 08/14/2022 0231   MCV 74 (L) 03/03/2018 1142   MCH 25.7 (L) 08/14/2022 0231   MCHC 31.0 08/14/2022 0231   RDW 16.8 (H) 08/14/2022 0231   RDW 16.6 (H) 03/03/2018 1142   LYMPHSABS 1.3 08/11/2022 2000   MONOABS 0.5 08/11/2022 2000   EOSABS 0.0 08/11/2022 2000   BASOSABS 0.0 08/11/2022 2000    BMET    Component Value Date/Time   NA 141 08/14/2022 0231   NA 144 03/03/2018 1142   K 3.4 (L) 08/14/2022 0231   CL 111 08/14/2022 0231   CO2 23 08/14/2022 0231   GLUCOSE 91 08/14/2022 0231   BUN 7 (L) 08/14/2022 0231   BUN 10 03/03/2018 1142   CREATININE 0.70 08/14/2022 0231   CREATININE 0.71 01/01/2016 1617   CALCIUM  8.9 08/14/2022 0231   GFRNONAA >60 08/14/2022 0231    IMAGING past 24 hours No results found.  Vitals:   08/14/22 0612 08/14/22 0700 08/14/22 0800 08/14/22 0825  BP: (!) 187/65 (!) 194/75 (!) 201/63 (!) 182/54  Pulse: 89 90 87 100  Resp: 20 (!) 21 20 17   Temp:   99 F (37.2 C)   TempSrc:   Axillary   SpO2: 96% 93% 96% 95%  Weight:      Height:         Physical Exam  Constitutional: Appears well-developed and well-nourished.   Cardiovascular: Normal rate and regular rhythm.  Respiratory: Effort normal, non-labored breathing, on 1L [place]- titrate off  Neuro: Mental Status: Patient is awake, alert, oriented to person, place, month, year, and situation. Patient is able to give a clear and coherent history. Speech is not fluent, but no aphasia. Fluency is improving  Cranial Nerves: II: Visual Fields are full. Pupils are equal, round, and reactive to light.   III,IV, VI: EOMI without ptosis or diploplia.  V: Facial sensation is symmetric to temperature VII: Facial movement is symmetric resting and smiling VIII: Hearing is intact to voice X: Palate elevates symmetrically XI: Shoulder shrug is symmetric. XII: Tongue protrudes midline without atrophy or fasciculations.  Motor: Tone is normal. Bulk is normal. 5/5 strength present in bilateral upper extremities  4/5 strength  in bilateral lower extremities Sensory: Sensation is symmetric to light touch in the arms and legs. Cerebellar: FNF intact bilaterally   ASSESSMENT/PLAN  Stroke:  left MCA scattered infarct due to left M3 occlusion s/p IR with TICI2c, etiology: Large vessel disease with left carotid stenosis Code Stroke CT head No acute abnormality. Small vessel disease. Atrophy. ASPECTS 10.    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 Bulky calcified plaque about  the carotid bifurcations/proximal ICAs with associated stenoses of up to 75% on the left and 50% on the right. Atheromatous change about the carotid siphons with associated moderate multifocal narrowing, left worse than right CT perfusion no core infarct, 23 mm perfusion deficit at posterior left M3 distribution IR left M3 occlusion s/p TICI2c with left M4 slow flow. Left ICA 35% stenosis, irregular plaque MRI scattered small acute infarcts in left MCA and left MCA/PCA watershed territories with no hemorrhagic transformation MRA improved appearance of left MCA posterior division branches and residual moderate to severe posterior M2 tandem stenosis 2D Echo EF 70 to 75% LDL 82 HgbA1c 5.8 VTE prophylaxis - lovenox clopidogrel 75 mg daily prior to admission, now on aspirin 81 mg daily and clopidogrel 75 mg daily for 3 weeks and then Plavix alone Therapy recommendations: HH PT Disposition: Pending  Carotid stenosis CTA head & neck Bulky calcified plaque about the carotid bifurcations/proximal ICAs with associated stenoses of up to 75% on the left and 50% on the right. Atheromatous change about the carotid siphons with associated moderate multifocal narrowing, left worse than right IR angiogram showed left ICA stenosis is approximately 35% with irregular plaque No intervention warranted at this time On DAPT  Hx of Stroke/TIA 09/2017 CT showed old left BG lacunar infarct. MRI showed right BG infarct. MRA right V4 stenosis.  Carotid Doppler negative.  EF 55 to 60%, LDL 117, A1c 6.2.  Discharged on DAPT and Lipitor 80.  Right groin hematoma Patient developed right groin hematoma after mechanical thrombectomy Manual pressure held Ultrasound reveals no pseudoaneurysm  History of hypertension  currently hypotensive Home meds: Minoxidil 5 mg daily, carvedilol 25 mg twice daily, spironolactone 25 mg daily, valsartan 320 mg daily - resumed SBP goal less than 180 Long term BP goal 130-150 given intra-  and extracranial ICA stenosis follows with [facility] Dr Jodie Echevaria  Hyperlipidemia Home meds: None LDL 82, goal < 70 Rosuvastatin 20 mg daily Continue statin at discharge  Diabetes type II  Home meds: None HgbA1c 5.8, goal < 7.0 CBGs  Other Stroke Risk Factors Obesity, Body mass index is 49.91 kg/m., BMI >/= 30 associated with increased stroke risk, recommend weight loss, diet and exercise as appropriate  Obstructive sleep apnea  Other Active Problems Right upper lung nodule-recommend noncontrast chest CT in 12 months UA WBC >50, no antibiotic needed at this time per pharmacy.  Hospital day # 3  Patient seen and examined by NP/APP with MD. MD to update note as needed.   Elmer Picker, DNP, FNP-BC [facility] Pager: [phone]  I have personally obtained history,examined this patient, reviewed notes, independently viewed imaging studies, participated in medical decision making and plan of care.ROS completed by me personally and pertinent positives fully documented  I have made any additions or clarifications directly to the above note. Agree with note above.  Patient is neurologically stable and doing well.  Blood pressure  still remains elevated requiring multiple PRNs.  Plan to increase blood pressure medications today.  Transfer to neurological floor bed.  Consider discharge when blood pressure adequately controlled.  Patient will need 30-day heart monitor at discharge to look for paroxysmal A-fib.  No family at the bedside.  Greater than 50% time during this 35-minute visit) on counseling and coordination of care about her stroke and discussion of plan of care with patient and care team and answering questions.  Delia Heady, MD Medical Director [facility] Pager: [phone] 08/14/2022 5:02 PM    To contact Stroke Continuity provider, please refer to [email or web address]. After hours, contact General Neurology

## 2022-08-14 NOTE — Progress Notes (Signed)
Off neo, now hypertensive which is probably her baseline.  PCCM available PRN.  Myrla Halsted MD PCCM

## 2022-08-14 NOTE — Plan of Care (Signed)
Problem: Education: Goal: Knowledge of disease or condition will improve Outcome: Progressing Goal: Knowledge of secondary prevention will improve (MUST DOCUMENT ALL) Outcome: Progressing Goal: Knowledge of patient specific risk factors will improve Loraine Leriche N/A or DELETE if not current risk factor) Outcome: Progressing   Problem: Ischemic Stroke/TIA Tissue Perfusion: Goal: Complications of ischemic stroke/TIA will be minimized Outcome: Progressing   Problem: Coping: Goal: Will verbalize positive feelings about self Outcome: Progressing Goal: Will identify appropriate support needs Outcome: Progressing   Problem: Health Behavior/Discharge Planning: Goal: Ability to manage health-related needs will improve Outcome: Progressing Goal: Goals will be collaboratively established with patient/family Outcome: Progressing   Problem: Self-Care: Goal: Ability to participate in self-care as condition permits will improve Outcome: Progressing Goal: Verbalization of feelings and concerns over difficulty with self-care will improve Outcome: Progressing Goal: Ability to communicate needs accurately will improve Outcome: Progressing   Problem: Nutrition: Goal: Risk of aspiration will decrease Outcome: Progressing Goal: Dietary intake will improve Outcome: Progressing   Problem: Education: Goal: Ability to describe self-care measures that may prevent or decrease complications (Diabetes Survival Skills Education) will improve Outcome: Progressing Goal: Individualized Educational Video(s) Outcome: Progressing   Problem: Coping: Goal: Ability to adjust to condition or change in health will improve Outcome: Progressing   Problem: Fluid Volume: Goal: Ability to maintain a balanced intake and output will improve Outcome: Progressing   Problem: Health Behavior/Discharge Planning: Goal: Ability to identify and utilize available resources and services will improve Outcome:  Progressing Goal: Ability to manage health-related needs will improve Outcome: Progressing   Problem: Metabolic: Goal: Ability to maintain appropriate glucose levels will improve Outcome: Progressing   Problem: Nutritional: Goal: Maintenance of adequate nutrition will improve Outcome: Progressing Goal: Progress toward achieving an optimal weight will improve Outcome: Progressing   Problem: Skin Integrity: Goal: Risk for impaired skin integrity will decrease Outcome: Progressing   Problem: Tissue Perfusion: Goal: Adequacy of tissue perfusion will improve Outcome: Progressing   Problem: Education: Goal: Understanding of CV disease, CV risk reduction, and recovery process will improve Outcome: Progressing Goal: Individualized Educational Video(s) Outcome: Progressing   Problem: Activity: Goal: Ability to return to baseline activity level will improve Outcome: Progressing   Problem: Cardiovascular: Goal: Ability to achieve and maintain adequate cardiovascular perfusion will improve Outcome: Progressing Goal: Vascular access site(s) Level 0-1 will be maintained Outcome: Progressing   Problem: Health Behavior/Discharge Planning: Goal: Ability to safely manage health-related needs after discharge will improve Outcome: Progressing   Problem: Activity: Goal: Ability to tolerate increased activity will improve Outcome: Progressing   Problem: Respiratory: Goal: Ability to maintain a clear airway and adequate ventilation will improve Outcome: Progressing   Problem: Role Relationship: Goal: Method of communication will improve Outcome: Progressing   Problem: Education: Goal: Knowledge of General Education information will improve Description: Including pain rating scale, medication(s)/side effects and non-pharmacologic comfort measures Outcome: Progressing   Problem: Health Behavior/Discharge Planning: Goal: Ability to manage health-related needs will improve Outcome:  Progressing   Problem: Clinical Measurements: Goal: Ability to maintain clinical measurements within normal limits will improve Outcome: Progressing Goal: Will remain free from infection Outcome: Progressing Goal: Diagnostic test results will improve Outcome: Progressing Goal: Respiratory complications will improve Outcome: Progressing Goal: Cardiovascular complication will be avoided Outcome: Progressing   Problem: Activity: Goal: Risk for activity intolerance will decrease Outcome: Progressing   Problem: Nutrition: Goal: Adequate nutrition will be maintained Outcome: Progressing   Problem: Coping: Goal: Level of anxiety will decrease Outcome: Progressing   Problem: Elimination: Goal: Will  not experience complications related to bowel motility Outcome: Progressing Goal: Will not experience complications related to urinary retention Outcome: Progressing   Problem: Pain Managment: Goal: General experience of comfort will improve Outcome: Progressing

## 2022-08-14 NOTE — Discharge Summary (Incomplete)
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   amLODipine 5 MG tablet Commonly known as: NORVASC Take 1 tablet (5 mg total) by mouth daily. Start taking on: August 16, 2022   aspirin 81 MG chewable tablet Chew 1 tablet (81 mg total) by mouth daily. Start taking on: August 16, 2022   carvedilol 25 MG tablet Commonly known as: COREG Take 1 tablet (25 mg total) by mouth 2 (two) times daily with a meal. What changed:  how much to take when to take this   clopidogrel 75 MG tablet Commonly known as: PLAVIX Take 1 tablet (75 mg total) by mouth daily.   diclofenac 75 MG EC tablet Commonly known as: VOLTAREN Take 75 mg by mouth daily as needed for mild pain or moderate pain.   estradiol 0.1 MG/GM vaginal cream Commonly known as: ESTRACE Place 1 Applicatorful vaginally daily as needed (vaginal burning).   ezetimibe 10 MG tablet Commonly known as: ZETIA Take 10 mg by mouth daily.   gabapentin 100 MG capsule Commonly known as: NEURONTIN Take 1 capsule (100 mg total) by mouth 3 (three) times  daily.   lidocaine 2 % solution Commonly known as: XYLOCAINE Use as directed 15 mLs in the mouth or throat every 3 (three) hours as needed for mouth pain (Sore throat).   minoxidil 10 MG tablet Commonly known as: LONITEN Take 10 mg by mouth 2 (two) times daily. What changed: Another medication with the same name was changed. Make sure you understand how and when to take each.   minoxidil 10 MG tablet Commonly known as: LONITEN Take 1 tablet (10 mg total) by mouth daily. Start taking on: August 16, 2022 What changed:  medication strength how much to take   mupirocin cream 2 % Commonly known as: BACTROBAN Apply 1 application topically daily as needed (rash under breast).   nystatin powder Commonly known as: MYCOSTATIN/NYSTOP Apply 1 application topically 3 (three) times daily as needed for rash.   Ozempic (0.25 or 0.5 MG/DOSE) 2 MG/3ML Sopn Generic drug: Semaglutide(0.25 or 0.5MG /DOS) Inject 0.5 mg into the skin once a week.   rosuvastatin 20 MG tablet Commonly known as: CRESTOR Take 20 mg by mouth daily.   spironolactone 25 MG tablet Commonly known as: ALDACTONE Take 25 mg by mouth daily.   triamcinolone lotion 0.1 % Commonly known as: KENALOG Apply 1 application topically daily as needed (Reash under breast).   trimethoprim 100 MG tablet Commonly known as: TRIMPEX Take 100 mg  by mouth daily.   valsartan 320 MG tablet Commonly known as: DIOVAN Take 320 mg by mouth at bedtime.   Xiidra 5 % Soln Generic drug: Lifitegrast Place 1 drop into both eyes 2 (two) times daily.        LABORATORY STUDIES CBC    Component Value Date/Time   WBC 5.4 08/15/2022 0536   RBC 3.95 08/15/2022 0536   HGB 10.4 (L) 08/15/2022 0536   HGB 12.5 03/03/2018 1142   HCT 32.5 (L) 08/15/2022 0536   HCT 40.6 03/03/2018 1142   PLT 197 08/15/2022 0536   PLT 297 03/03/2018 1142   MCV 82.3 08/15/2022 0536   MCV 74 (L) 03/03/2018 1142   MCH 26.3 08/15/2022 0536   MCHC 32.0 08/15/2022  0536   RDW 16.8 (H) 08/15/2022 0536   RDW 16.6 (H) 03/03/2018 1142   LYMPHSABS 1.3 08/11/2022 2000   MONOABS 0.5 08/11/2022 2000   EOSABS 0.0 08/11/2022 2000   BASOSABS 0.0 08/11/2022 2000   CMP    Component Value Date/Time   NA 140 08/15/2022 0536   NA 144 03/03/2018 1142   K 3.5 08/15/2022 0536   CL 109 08/15/2022 0536   CO2 25 08/15/2022 0536   GLUCOSE 101 (H) 08/15/2022 0536   BUN 6 (L) 08/15/2022 0536   BUN 10 03/03/2018 1142   CREATININE 0.69 08/15/2022 0536   CREATININE 0.71 01/01/2016 1617   CALCIUM 9.5 08/15/2022 0536   PROT 7.0 08/11/2022 2000   PROT 7.4 03/03/2018 1142   ALBUMIN 3.3 (L) 08/15/2022 0536   ALBUMIN 4.6 03/03/2018 1142   AST 14 (L) 08/11/2022 2000   ALT 9 08/11/2022 2000   ALKPHOS 52 08/11/2022 2000   BILITOT 1.0 08/11/2022 2000   BILITOT <0.2 03/03/2018 1142   GFRNONAA >60 08/15/2022 0536   GFRAA 99 03/03/2018 1142   COAGS Lab Results  Component Value Date   INR 1.1 08/11/2022   INR 1.00 10/06/2017   INR 1.12 11/05/2014   Lipid Panel    Component Value Date/Time   CHOL 133 08/12/2022 0647   CHOL 188 03/03/2018 1142   TRIG 87 08/12/2022 0647   HDL 34 (L) 08/12/2022 0647   HDL 56 03/03/2018 1142   CHOLHDL 3.9 08/12/2022 0647   VLDL 17 08/12/2022 0647   LDLCALC 82 08/12/2022 0647   LDLCALC 117 (H) 03/03/2018 1142   HgbA1C  Lab Results  Component Value Date   HGBA1C 5.8 (H) 08/11/2022   Urinalysis    Component Value Date/Time   COLORURINE YELLOW 08/12/2022 0112   APPEARANCEUR HAZY (A) 08/12/2022 0112   LABSPEC >1.046 (H) 08/12/2022 0112   PHURINE 5.0 08/12/2022 0112   GLUCOSEU NEGATIVE 08/12/2022 0112   HGBUR NEGATIVE 08/12/2022 0112   BILIRUBINUR NEGATIVE 08/12/2022 0112   KETONESUR NEGATIVE 08/12/2022 0112   PROTEINUR 30 (A) 08/12/2022 0112   UROBILINOGEN 1.0 12/09/2012 1203   NITRITE NEGATIVE 08/12/2022 0112   LEUKOCYTESUR LARGE (A) 08/12/2022 0112   Urine Drug Screen     Component Value Date/Time   LABOPIA NONE  DETECTED 08/12/2022 1617   COCAINSCRNUR NONE DETECTED 08/12/2022 1617   LABBENZ NONE DETECTED 08/12/2022 1617   AMPHETMU NONE DETECTED 08/12/2022 1617   THCU NONE DETECTED 08/12/2022 1617   LABBARB NONE DETECTED 08/12/2022 1617    Alcohol Level    Component Value Date/Time   ETH <10 08/11/2022 2000     SIGNIFICANT DIAGNOSTIC STUDIES ARTERIAL PSEUDOANEURYSM COMPRESSION  Result Date: 08/12/2022  ARTERIAL PSEUDOANEURYSM  Patient Name:  Keenya Hollick  Date of Exam:   08/12/2022 Medical Rec #: [id number]        Accession #:    [id number] Date of Birth: 1950-03-15        Patient Gender: F Patient Age:   49 years Exam Location:  [facility] Procedure:      VAS [place] LOWER EXT ARTERIAL PSEUDOANEURYSM COMPRESSION Referring Phys: Jerilynn Mages DE MACEDO RODRIGUES --------------------------------------------------------------------------------  Exam: Right groin Indications: Patient complains of bruising and hematoma. History: S/p cerebral angiogram and thrombectomy 6/26. Comparison Study: No prior study Performing Technologist: Sherren Kerns RVS  Examination Guidelines: A complete evaluation includes B-mode imaging, spectral Doppler, color Doppler, and power Doppler as needed of all accessible portions of each vessel. Bilateral testing is considered an integral part of a complete examination. Limited examinations for reoccurring indications may be performed as noted.  Summary: No evidence of pseudoaneurysm, AVF or DVT  Diagnosing physician: Gerarda Fraction Electronically signed by Gerarda Fraction on 08/12/2022 at 7:36:36 PM.   --------------------------------------------------------------------------------    Final    ECHOCARDIOGRAM COMPLETE  Result Date: 08/12/2022    ECHOCARDIOGRAM REPORT   Patient Name:   AMIYLA PLOTT Date of Exam: 08/12/2022 Medical Rec #:  [id number]       Height:       64.0 in Accession #:    [id number]      Weight:       290.8 lb Date of Birth:  12-20-1950       BSA:          2.294 m  Patient Age:    72 years        BP:           134/47 mmHg Patient Gender: F               HR:           84 bpm. Exam Location:  Inpatient Procedure: 2D Echo, Cardiac Doppler, Color Doppler and Intracardiac            Opacification Agent Indications:    Stroke I63.9  History:        Patient has prior history of Echocardiogram examinations, most                 recent 10/06/2017. Stroke; Risk Factors:Hypertension,                 Dyslipidemia and Former Smoker.  Sonographer:    Dondra Prader RVT RCS Referring Phys: [phone] ERIC LINDZEN  Sonographer Comments: Suboptimal apical window. 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 IMPRESSIONS  1. Left ventricular ejection fraction, by estimation, is 70 to 75%. The left ventricle has hyperdynamic function. The left ventricle has no regional wall motion abnormalities. There is moderate concentric left ventricular hypertrophy. Left ventricular diastolic parameters are consistent with Grade I diastolic dysfunction (impaired relaxation).  2. Right ventricular systolic function is normal. The right ventricular size is normal.  3. The mitral valve is grossly normal. Trivial mitral valve regurgitation. No evidence of mitral stenosis.  4. The aortic valve was not well visualized. Aortic valve regurgitation is not visualized.  5. The inferior vena cava is normal in size with greater than 50% respiratory variability, suggesting right atrial pressure of 3 mmHg. Comparison(s): No significant change from prior study. Conclusion(s)/Recommendation(s): No intracardiac source of embolism detected on this transthoracic study. Consider a transesophageal echocardiogram to exclude cardiac source of embolism if clinically indicated. FINDINGS  Left Ventricle: Left ventricular ejection fraction, by estimation, is 70 to  75%. The left ventricle has hyperdynamic function. The left ventricle has no regional wall motion abnormalities. Definity contrast agent was given IV to delineate the  left ventricular endocardial borders. The left ventricular internal cavity size was normal in size. There is moderate concentric left ventricular hypertrophy. Left ventricular diastolic parameters are consistent with Grade I diastolic dysfunction (impaired relaxation). Right Ventricle: The right ventricular size is normal. No increase in right ventricular wall thickness. Right ventricular systolic function is normal. Left Atrium: Left atrial size was normal in size. Right Atrium: Right atrial size was normal in size. Pericardium: There is no evidence of pericardial effusion. Mitral Valve: The mitral valve is grossly normal. Mild to moderate mitral annular calcification. Trivial mitral valve regurgitation. No evidence of mitral valve stenosis. Tricuspid Valve: The tricuspid valve is normal in structure. Tricuspid valve regurgitation is trivial. Aortic Valve: The aortic valve was not well visualized. Aortic valve regurgitation is not visualized. Pulmonic Valve: The pulmonic valve was normal in structure. Pulmonic valve regurgitation is trivial. Aorta: The aortic root is normal in size and structure. Venous: The inferior vena cava is normal in size with greater than 50% respiratory variability, suggesting right atrial pressure of 3 mmHg. IAS/Shunts: The atrial septum is grossly normal.  LEFT VENTRICLE           + PLAX 2D LVIDd:         4.05 cm LVIDs:         2.50 cm   Diastology LV PW:         1.25 cm   LV e' medial:    4.35 cm/s LV IVS:        1.40 cm   LV E/e' medial:  17.8 LVOT diam:     2.00 cm   LV e' lateral:   5.22 cm/s LV SV:         69        LV E/e' lateral: 14.9 LV SV Index:   30 LVOT Area:     3.14 cm  RIGHT VENTRICLE             IVC RV S prime:     13.80 cm/s  IVC diam: 1.60 cm TAPSE (M-mode): 1.8 cm LEFT ATRIUM             Index        RIGHT ATRIUM           Index LA diam:        3.50 cm 1.53 cm/m   RA Area:     14.30 cm LA Vol (A2C):   32.9 ml 14.34 ml/m  RA Volume:   30.80 ml  13.43 ml/m LA Vol  (A4C):   25.0 ml 10.90 ml/m LA Biplane Vol: 30.0 ml 13.08 ml/m  AORTIC VALVE LVOT Vmax:   106.00 cm/s LVOT Vmean:  66.900 cm/s LVOT VTI:    0.220 m  AORTA Ao Root diam: 2.80 cm Ao Asc diam:  2.20 cm MITRAL VALVE               TRICUSPID VALVE MV Area (PHT): 2.73 cm    TR Peak grad:   11.0 mmHg MV Decel Time: 278 msec    TR Vmax:        166.00 cm/s MV E velocity: 77.60 cm/s MV A velocity: 87.00 cm/s  SHUNTS MV E/A ratio:  0.89        Systemic VTI:  0.22 m  Systemic Diam: 2.00 cm Laurance Flatten MD Electronically signed by Laurance Flatten MD Signature Date/Time: 08/12/2022/1:46:15 PM    Final    IR PERCUTANEOUS ART THROMBECTOMY/INFUSION INTRACRANIAL INC DIAG ANGIO  Result Date: 08/12/2022 INDICATION: Sarya Roath is an 72 year old female presenting with acute onset of aphasia and right mid hemianopia; NIHSS 4. Her past medical history significant for arthritis, right basal ganglia stroke, DM2, HLD, HTN, morbid obesity and OSA; premorbid modified Rankin scale 0. Her last known well was 1:00 PM on 08/11/2022. Head CT showed no evidence of an acute infarct or hemorrhage. CT angiogram of the head and neck showed a left M3/MCA posterior division branch occlusion. She comes to our service for mechanical thrombectomy. EXAM: ULTRASOUND-GUIDED VASCULAR ACCESS DIAGNOSTIC CEREBRAL ANGIOGRAM MECHANICAL THROMBECTOMY FLAT PANEL HEAD CT ARTERIAL LINE PLACEMENT COMPARISON:  CT/CT angiogram of the head and neck 08/11/2022. MEDICATIONS: Ancef 3 g IV. ANESTHESIA/SEDATION: The procedure was performed under general anesthesia. CONTRAST:  64 mL of Omnipaque 300 milligram/mL FLUOROSCOPY: Radiation Exposure Index (as provided by the fluoroscopic device): 2,328 mGy Kerma COMPLICATIONS: SIR Level A - No therapy, no consequence. TECHNIQUE: Informed written consent was waved due to altered mental status of patient with no healthcare proxy or next of kin available and emergent nature of procedure. Three  physicians agreed that the procedure was necessary. Maximal Sterile Barrier Technique was utilized including caps, mask, sterile gowns, sterile gloves, sterile drape, hand hygiene and skin antiseptic. A timeout was performed prior to the initiation of the procedure. The right groin was prepped and draped in the usual sterile fashion. Using a micropuncture kit and the modified Seldinger technique, access was gained to the right common femoral artery and an 8 French sheath was placed. Real-time ultrasound guidance was utilized for vascular access including the acquisition of a permanent ultrasound image documenting patency of the accessed vessel. Under fluoroscopy, a Zoom 88 guide catheter was navigated over a 6 [place] Berenstein 2 catheter and a 0.035" Terumo Glidewire into the aortic arch. The catheter was placed into the left common carotid artery and then advanced into the left internal carotid artery. The diagnostic catheter was removed. Frontal, lateral angiograms and multiple magnified oblique angiograms of the head were obtained. FINDINGS: 1. Atherosclerotic changes of the right common femoral artery without hemodynamically significant stenosis. 2. Perfusion wedge defect in the left temporoparietal region. 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 PROCEDURE: Using biplane roadmap guidance, a 38 Socrates aspiration catheter was navigated over a phenom 21 microcatheter and an Aristotle 14 microguidewire into the cavernous segment of the left ICA. The microcatheter was then navigated over the wire into the left M3/MCA posterior division branch. Multiple attempts necessary to access the occluded M3 branch. Then, the aspiration catheter was advanced over the microcatheter into the occluded branch and connected to an aspiration pump. The microcatheter was removed. After approximately 1 minutes, the aspiration catheter was removed under constant aspiration. The guide catheter was aspirated  for debris. Left internal carotid artery angiograms with frontal and lateral views of the head showed recanalization of the left M3 branch - TICI 2C. Slow flow is seen at the terminal cortical branches (M4). Flat panel CT of the head was obtained and post processed in a separate workstation with concurrent attending physician supervision. Selected images were sent to PACS. No evidence of hemorrhagic complication. Contrast staining of the basal ganglia and anterior left temporal lobe seen. Right common femoral artery angiogram was obtained in right anterior oblique view. The puncture is at  the level of the common femoral artery. The artery has normal caliber, adequate for closure device. The sheath was exchanged over the wire for an 8 [place] Angio-Seal which was utilized for access closure. However, collagen plug herniated through the skin. Manual pressure was then held. Hemostasis was achieved. Patient was extubated. However, due to extreme agitation and combativeness, sedation and re-intubation was necessary. At this point, a groin hematoma was noted and systolic blood pressure dropped to 60 mmHg. Additional manual pressure was held at the access site for approximately 45 minutes. For better blood pressure monitoring, access was gained to the right radial artery using the modified Seldinger technique for arterial line placement. A 20 gauge catheter was utilized. Real-time ultrasound guidance was utilized for vascular access including the acquisition of a permanent ultrasound image documenting patency of the accessed vessel. Patient was then transferred to ICU in stable condition. IMPRESSION: Successful mechanical thrombectomy performed for treatment of a left M3/MCA occlusion achieving complete recanalization after 1 pass with slow flow noted in the distal M4 branches (TICI 2 C). No hemorrhagic complication. PLAN: Transfer to ICU for continued post stroke care. Electronically Signed   By: Baldemar Lenis M.D.   On: 08/12/2022 10:42   IR US Guide Vasc Access Right  Result Date: 08/12/2022 INDICATION: Deloris Bierl is an 72 year old female presenting with acute onset of aphasia and right mid hemianopia; NIHSS 4. Her past medical history significant for arthritis, right basal ganglia stroke, DM2, HLD, HTN, morbid obesity and OSA; premorbid modified Rankin scale 0. Her last known well was 1:00 PM on 08/11/2022. Head CT showed no evidence of an acute infarct or hemorrhage. CT angiogram of the head and neck showed a left M3/MCA posterior division branch occlusion. She comes to our service for mechanical thrombectomy. EXAM: ULTRASOUND-GUIDED VASCULAR ACCESS DIAGNOSTIC CEREBRAL ANGIOGRAM MECHANICAL THROMBECTOMY FLAT PANEL HEAD CT ARTERIAL LINE PLACEMENT COMPARISON:  CT/CT angiogram of the head and neck 08/11/2022. MEDICATIONS: Ancef 3 g IV. ANESTHESIA/SEDATION: The procedure was performed under general anesthesia. CONTRAST:  64 mL of Omnipaque 300 milligram/mL FLUOROSCOPY: Radiation Exposure Index (as provided by the fluoroscopic device): 2,328 mGy Kerma COMPLICATIONS: SIR Level A - No therapy, no consequence. TECHNIQUE: Informed written consent was waved due to altered mental status of patient with no healthcare proxy or next of kin available and emergent nature of procedure. Three physicians agreed that the procedure was necessary. Maximal Sterile Barrier Technique was utilized including caps, mask, sterile gowns, sterile gloves, sterile drape, hand hygiene and skin antiseptic. A timeout was performed prior to the initiation of the procedure. The right groin was prepped and draped in the usual sterile fashion. Using a micropuncture kit and the modified Seldinger technique, access was gained to the right common femoral artery and an 8 French sheath was placed. Real-time ultrasound guidance was utilized for vascular access including the acquisition of a permanent ultrasound image documenting patency of the  accessed vessel. Under fluoroscopy, a Zoom 88 guide catheter was navigated over a 6 [place] Berenstein 2 catheter and a 0.035" Terumo Glidewire into the aortic arch. The catheter was placed into the left common carotid artery and then advanced into the left internal carotid artery. The diagnostic catheter was removed. Frontal, lateral angiograms and multiple magnified oblique angiograms of the head were obtained. FINDINGS: 1. Atherosclerotic changes of the right common femoral artery without hemodynamically significant stenosis. 2. Perfusion wedge defect in the left temporoparietal region. 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 PROCEDURE: Using  biplane roadmap guidance, a 38 Socrates aspiration catheter was navigated over a phenom 21 microcatheter and an Aristotle 14 microguidewire into the cavernous segment of the left ICA. The microcatheter was then navigated over the wire into the left M3/MCA posterior division branch. Multiple attempts necessary to access the occluded M3 branch. Then, the aspiration catheter was advanced over the microcatheter into the occluded branch and connected to an aspiration pump. The microcatheter was removed. After approximately 1 minutes, the aspiration catheter was removed under constant aspiration. The guide catheter was aspirated for debris. Left internal carotid artery angiograms with frontal and lateral views of the head showed recanalization of the left M3 branch - TICI 2C. Slow flow is seen at the terminal cortical branches (M4). Flat panel CT of the head was obtained and post processed in a separate workstation with concurrent attending physician supervision. Selected images were sent to PACS. No evidence of hemorrhagic complication. Contrast staining of the basal ganglia and anterior left temporal lobe seen. Right common femoral artery angiogram was obtained in right anterior oblique view. The puncture is at the level of the common femoral artery.  The artery has normal caliber, adequate for closure device. The sheath was exchanged over the wire for an 8 [place] Angio-Seal which was utilized for access closure. However, collagen plug herniated through the skin. Manual pressure was then held. Hemostasis was achieved. Patient was extubated. However, due to extreme agitation and combativeness, sedation and re-intubation was necessary. At this point, a groin hematoma was noted and systolic blood pressure dropped to 60 mmHg. Additional manual pressure was held at the access site for approximately 45 minutes. For better blood pressure monitoring, access was gained to the right radial artery using the modified Seldinger technique for arterial line placement. A 20 gauge catheter was utilized. Real-time ultrasound guidance was utilized for vascular access including the acquisition of a permanent ultrasound image documenting patency of the accessed vessel. Patient was then transferred to ICU in stable condition. IMPRESSION: Successful mechanical thrombectomy performed for treatment of a left M3/MCA occlusion achieving complete recanalization after 1 pass with slow flow noted in the distal M4 branches (TICI 2 C). No hemorrhagic complication. PLAN: Transfer to ICU for continued post stroke care. Electronically Signed   By: Baldemar Lenis M.D.   On: 08/12/2022 10:42   IR CT Head Ltd  Result Date: 08/12/2022 INDICATION: Donabelle Picquet is an 72 year old female presenting with acute onset of aphasia and right mid hemianopia; NIHSS 4. Her past medical history significant for arthritis, right basal ganglia stroke, DM2, HLD, HTN, morbid obesity and OSA; premorbid modified Rankin scale 0. Her last known well was 1:00 PM on 08/11/2022. Head CT showed no evidence of an acute infarct or hemorrhage. CT angiogram of the head and neck showed a left M3/MCA posterior division branch occlusion. She comes to our service for mechanical thrombectomy. EXAM:  ULTRASOUND-GUIDED VASCULAR ACCESS DIAGNOSTIC CEREBRAL ANGIOGRAM MECHANICAL THROMBECTOMY FLAT PANEL HEAD CT ARTERIAL LINE PLACEMENT COMPARISON:  CT/CT angiogram of the head and neck 08/11/2022. MEDICATIONS: Ancef 3 g IV. ANESTHESIA/SEDATION: The procedure was performed under general anesthesia. CONTRAST:  64 mL of Omnipaque 300 milligram/mL FLUOROSCOPY: Radiation Exposure Index (as provided by the fluoroscopic device): 2,328 mGy Kerma COMPLICATIONS: SIR Level A - No therapy, no consequence. TECHNIQUE: Informed written consent was waved due to altered mental status of patient with no healthcare proxy or next of kin available and emergent nature of procedure. Three physicians agreed that the procedure was necessary. Maximal Sterile Barrier  Technique was utilized including caps, mask, sterile gowns, sterile gloves, sterile drape, hand hygiene and skin antiseptic. A timeout was performed prior to the initiation of the procedure. The right groin was prepped and draped in the usual sterile fashion. Using a micropuncture kit and the modified Seldinger technique, access was gained to the right common femoral artery and an 8 French sheath was placed. Real-time ultrasound guidance was utilized for vascular access including the acquisition of a permanent ultrasound image documenting patency of the accessed vessel. Under fluoroscopy, a Zoom 88 guide catheter was navigated over a 6 [place] Berenstein 2 catheter and a 0.035" Terumo Glidewire into the aortic arch. The catheter was placed into the left common carotid artery and then advanced into the left internal carotid artery. The diagnostic catheter was removed. Frontal, lateral angiograms and multiple magnified oblique angiograms of the head were obtained. FINDINGS: 1. Atherosclerotic changes of the right common femoral artery without hemodynamically significant stenosis. 2. Perfusion wedge defect in the left temporoparietal region. Suspected M3 occlusion although no  opacification of stump is seen. PROCEDURE: Using biplane roadmap guidance, a 38 Socrates aspiration catheter was navigated over a phenom 21 microcatheter and an Aristotle 14 microguidewire into the cavernous segment of the left ICA. The microcatheter was then navigated over the wire into the left M3/MCA posterior division branch. Multiple attempts necessary to access the occluded M3 branch. Then, the aspiration catheter was advanced over the microcatheter into the occluded branch and connected to an aspiration pump. The microcatheter was removed. After approximately 1 minutes, the aspiration catheter was removed under constant aspiration. The guide catheter was aspirated for debris. Left internal carotid artery angiograms with frontal and lateral views of the head showed recanalization of the left M3 branch - TICI 2C. Slow flow is seen at the terminal cortical branches (M4). Flat panel CT of the head was obtained and post processed in a separate workstation with concurrent attending physician supervision. Selected images were sent to PACS. No evidence of hemorrhagic complication. Contrast staining of the basal ganglia and anterior left temporal lobe seen. Right common femoral artery angiogram was obtained in right anterior oblique view. The puncture is at the level of the common femoral artery. The artery has normal caliber, adequate for closure device. The sheath was exchanged over the wire for an 8 [place] Angio-Seal which was utilized for access closure. However, collagen plug herniated through the skin. Manual pressure was then held. Hemostasis was achieved. Patient was extubated. However, due to extreme agitation and combativeness, sedation and re-intubation was necessary. At this point, a groin hematoma was noted and systolic blood pressure dropped to 60 mmHg. Additional manual pressure was held at the access site for approximately 45 minutes. For better blood pressure monitoring, access was gained to the right  radial artery using the modified Seldinger technique for arterial line placement. A 20 gauge catheter was utilized. Real-time ultrasound guidance was utilized for vascular access including the acquisition of a permanent ultrasound image documenting patency of the accessed vessel. Patient was then transferred to ICU in stable condition. IMPRESSION: Successful mechanical thrombectomy performed for treatment of a left M3/MCA occlusion achieving complete recanalization after 1 pass with slow flow noted in the distal M4 branches (TICI 2 C). No hemorrhagic complication. PLAN: Transfer to ICU for continued post stroke care. Electronically Signed   By: Baldemar Lenis M.D.   On: 08/12/2022 10:42   IR US Guide Vasc Access Right  Result Date: 08/12/2022 INDICATION: Kaileigh Duxbury is an 72 year old  female presenting with acute onset of aphasia and right mid hemianopia; NIHSS 4. Her past medical history significant for arthritis, right basal ganglia stroke, DM2, HLD, HTN, morbid obesity and OSA; premorbid modified Rankin scale 0. Her last known well was 1:00 PM on 08/11/2022. Head CT showed no evidence of an acute infarct or hemorrhage. CT angiogram of the head and neck showed a left M3/MCA posterior division branch occlusion. She comes to our service for mechanical thrombectomy. EXAM: ULTRASOUND-GUIDED VASCULAR ACCESS DIAGNOSTIC CEREBRAL ANGIOGRAM MECHANICAL THROMBECTOMY FLAT PANEL HEAD CT ARTERIAL LINE PLACEMENT COMPARISON:  CT/CT angiogram of the head and neck 08/11/2022. MEDICATIONS: Ancef 3 g IV. ANESTHESIA/SEDATION: The procedure was performed under general anesthesia. CONTRAST:  64 mL of Omnipaque 300 milligram/mL FLUOROSCOPY: Radiation Exposure Index (as provided by the fluoroscopic device): 2,328 mGy Kerma COMPLICATIONS: SIR Level A - No therapy, no consequence. TECHNIQUE: Informed written consent was waved due to altered mental status of patient with no healthcare proxy or next of kin available and  emergent nature of procedure. Three physicians agreed that the procedure was necessary. Maximal Sterile Barrier Technique was utilized including caps, mask, sterile gowns, sterile gloves, sterile drape, hand hygiene and skin antiseptic. A timeout was performed prior to the initiation of the procedure. The right groin was prepped and draped in the usual sterile fashion. Using a micropuncture kit and the modified Seldinger technique, access was gained to the right common femoral artery and an 8 French sheath was placed. Real-time ultrasound guidance was utilized for vascular access including the acquisition of a permanent ultrasound image documenting patency of the accessed vessel. Under fluoroscopy, a Zoom 88 guide catheter was navigated over a 6 [place] Berenstein 2 catheter and a 0.035" Terumo Glidewire into the aortic arch. The catheter was placed into the left common carotid artery and then advanced into the left internal carotid artery. The diagnostic catheter was removed. Frontal, lateral angiograms and multiple magnified oblique angiograms of the head were obtained. FINDINGS: 1. Atherosclerotic changes of the right common femoral artery without hemodynamically significant stenosis. 2. Perfusion wedge defect in the left temporoparietal region. 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 PROCEDURE: Using biplane roadmap guidance, a 38 Socrates aspiration catheter was navigated over a phenom 21 microcatheter and an Aristotle 14 microguidewire into the cavernous segment of the left ICA. The microcatheter was then navigated over the wire into the left M3/MCA posterior division branch. Multiple attempts necessary to access the occluded M3 branch. Then, the aspiration catheter was advanced over the microcatheter into the occluded branch and connected to an aspiration pump. The microcatheter was removed. After approximately 1 minutes, the aspiration catheter was removed under constant aspiration.  The guide catheter was aspirated for debris. Left internal carotid artery angiograms with frontal and lateral views of the head showed recanalization of the left M3 branch - TICI 2C. Slow flow is seen at the terminal cortical branches (M4). Flat panel CT of the head was obtained and post processed in a separate workstation with concurrent attending physician supervision. Selected images were sent to PACS. No evidence of hemorrhagic complication. Contrast staining of the basal ganglia and anterior left temporal lobe seen. Right common femoral artery angiogram was obtained in right anterior oblique view. The puncture is at the level of the common femoral artery. The artery has normal caliber, adequate for closure device. The sheath was exchanged over the wire for an 8 [place] Angio-Seal which was utilized for access closure. However, collagen plug herniated through the skin. Manual  pressure was then held. Hemostasis was achieved. Patient was extubated. However, due to extreme agitation and combativeness, sedation and re-intubation was necessary. At this point, a groin hematoma was noted and systolic blood pressure dropped to 60 mmHg. Additional manual pressure was held at the access site for approximately 45 minutes. For better blood pressure monitoring, access was gained to the right radial artery using the modified Seldinger technique for arterial line placement. A 20 gauge catheter was utilized. Real-time ultrasound guidance was utilized for vascular access including the acquisition of a permanent ultrasound image documenting patency of the accessed vessel. Patient was then transferred to ICU in stable condition. IMPRESSION: Successful mechanical thrombectomy performed for treatment of a left M3/MCA occlusion achieving complete recanalization after 1 pass with slow flow noted in the distal M4 branches (TICI 2 C). No hemorrhagic complication. PLAN: Transfer to ICU for continued post stroke care. Electronically Signed    By: Baldemar Lenis M.D.   On: 08/12/2022 10:42   MR BRAIN WO CONTRAST  Result Date: 08/12/2022 CLINICAL DATA:  72 year old female code stroke presentation yesterday with left MCA branch occlusion status post endovascular revascularization. EXAM: MRI HEAD WITHOUT CONTRAST MRA HEAD WITHOUT CONTRAST TECHNIQUE: Multiplanar, multi-echo pulse sequences of the brain and surrounding structures were acquired without intravenous contrast. Angiographic images of the Circle of Willis were acquired using MRA technique without intravenous contrast. COMPARISON:  CTA head and neck yesterday. Previous brain MRI 10/06/2017. FINDINGS: MRI HEAD FINDINGS Brain: Punctate diffusion restriction in the left caudate nucleus suspected on series 5, image 74. And mild scattered posterior left MCA, left MCA/PCA watershed cortical and white matter diffusion restriction (series 5, images 72 and 75). Mild associated cytotoxic edema. No hemorrhagic transformation; underlying chronic microhemorrhage in the left occipital lobe white matter series 16, image 28 on [facility]. No contralateral right hemisphere or posterior fossa restricted diffusion. 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 Chronic right basal ganglia lacunar infarcts are new since that time. Widely scattered patchy bilateral cerebral white matter T2 and FLAIR hyperintensity otherwise not significantly changed. No midline shift, mass effect, evidence of mass lesion, ventriculomegaly, extra-axial collection or acute intracranial hemorrhage. Cervicomedullary junction and pituitary are within normal limits. Vascular: Major intracranial vascular flow voids appear stable. Skull and upper cervical spine: Negative for age visible cervical spine. Visualized bone marrow signal is within normal limits. Sinuses/Orbits: Negative orbits. Paranasal sinus mucosal thickening and fluid in the setting of intubation. Other: Fluid in the nasal cavity and nasopharynx in the  setting of intubation. Mastoids remain well aerated. MRA HEAD FINDINGS Anterior circulation: Antegrade flow in both ICA siphons. Left greater than right ICA siphon irregularity. Patent carotid termini, MCA and right ACA origins. Left ACA A1 irregularity and stenosis is stable on series 1065, image 11. Other visible ACA branches are within normal limits. Right MCA M1 segment and visible right MCA branches are stable, including moderate posterior division irregularity and stenosis on series 1071, image 15). Left MCA M1 segment remains patent with mild distal M1 stenosis (series 1071, image 10). Left MCA branches appear more numerous compared to the CTA yesterday. There is residual moderate to severe tandem a regularity and stenosis in the most posterior division on series 1071, image 6. Posterior circulation: Antegrade flow in the posterior circulation. Dominant distal left vertebral artery, vertebrobasilar junction and basilar artery appear stable. Bilateral PICA, SCA and PCA origins remain patent. Posterior communicating arteries are diminutive or absent. Bilateral PCA branches appear stable. Anatomic variants: Dominant left vertebral  artery. IMPRESSION: 1. Intracranial atherosclerosis. Improved appearance of Left MCA posterior division branches following endovascular reperfusion. 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 2. Mild, scattered small acute infarcts in the Left MCA and Left MCA/PCA watershed territories. No hemorrhagic transformation or mass effect. 3. No new intracranial abnormality. 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 Electronically Signed   By: Odessa Fleming M.D.   On: 08/12/2022 10:24   MR ANGIO HEAD WO CONTRAST  Result Date: 08/12/2022 CLINICAL DATA:  72 year old female code stroke presentation yesterday with left MCA branch occlusion status post endovascular revascularization.  EXAM: MRI HEAD WITHOUT CONTRAST MRA HEAD WITHOUT CONTRAST TECHNIQUE: Multiplanar, multi-echo pulse sequences of the brain and surrounding structures were acquired without intravenous contrast. Angiographic images of the Circle of Willis were acquired using MRA technique without intravenous contrast. COMPARISON:  CTA head and neck yesterday. Previous brain MRI 10/06/2017. FINDINGS: MRI HEAD FINDINGS Brain: Punctate diffusion restriction in the left caudate nucleus suspected on series 5, image 74. And mild scattered posterior left MCA, left MCA/PCA watershed cortical and white matter diffusion restriction (series 5, images 72 and 75). Mild associated cytotoxic edema. No hemorrhagic transformation; underlying chronic microhemorrhage in the left occipital lobe white matter series 16, image 28 on [facility]. No contralateral right hemisphere or posterior fossa restricted diffusion. 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 Chronic right basal ganglia lacunar infarcts are new since that time. Widely scattered patchy bilateral cerebral white matter T2 and FLAIR hyperintensity otherwise not significantly changed. No midline shift, mass effect, evidence of mass lesion, ventriculomegaly, extra-axial collection or acute intracranial hemorrhage. Cervicomedullary junction and pituitary are within normal limits. Vascular: Major intracranial vascular flow voids appear stable. Skull and upper cervical spine: Negative for age visible cervical spine. Visualized bone marrow signal is within normal limits. Sinuses/Orbits: Negative orbits. Paranasal sinus mucosal thickening and fluid in the setting of intubation. Other: Fluid in the nasal cavity and nasopharynx in the setting of intubation. Mastoids remain well aerated. MRA HEAD FINDINGS Anterior circulation: Antegrade flow in both ICA siphons. Left greater than right ICA siphon irregularity. Patent carotid termini, MCA and right ACA origins. Left ACA A1 irregularity and  stenosis is stable on series 1065, image 11. Other visible ACA branches are within normal limits. Right MCA M1 segment and visible right MCA branches are stable, including moderate posterior division irregularity and stenosis on series 1071, image 15). Left MCA M1 segment remains patent with mild distal M1 stenosis (series 1071, image 10). Left MCA branches appear more numerous compared to the CTA yesterday. There is residual moderate to severe tandem a regularity and stenosis in the most posterior division on series 1071, image 6. Posterior circulation: Antegrade flow in the posterior circulation. Dominant distal left vertebral artery, vertebrobasilar junction and basilar artery appear stable. Bilateral PICA, SCA and PCA origins remain patent. Posterior communicating arteries are diminutive or absent. Bilateral PCA branches appear stable. Anatomic variants: Dominant left vertebral artery. IMPRESSION: 1. Intracranial atherosclerosis. Improved appearance of Left MCA posterior division branches following endovascular reperfusion. 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 2. Mild, scattered small acute infarcts in the Left MCA and Left MCA/PCA watershed territories. No hemorrhagic transformation or mass effect. 3. No new intracranial abnormality. 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 Electronically Signed   By: Odessa Fleming M.D.   On: 08/12/2022  10:24   Portable Chest x-ray  Result Date: 08/12/2022 CLINICAL DATA:  [id number] Endotracheal tube present [id number] EXAM: PORTABLE CHEST 1 VIEW COMPARISON:  Chest x-ray 07/08/2022, CT chest 04/23/2016 FINDINGS: Interval placement endotracheal tube with tip terminating 3 cm above the carina. Enteric tube coursing below the hemidiaphragm with tip and side port collimated off view. Enlarged cardiac silhouette. The heart and mediastinal contours are unchanged. Question  retrocardiac airspace opacity. No pulmonary edema. No pleural effusion. No pneumothorax. No acute osseous abnormality. IMPRESSION: 1. Question retrocardiac airspace opacity. 2. Lines and tubes as above. Electronically Signed   By: Tish Frederickson M.D.   On: 08/12/2022 01:35   CT ANGIO HEAD NECK W WO CM W PERF (CODE STROKE)  Result Date: 08/11/2022 CLINICAL DATA:  Initial evaluation for neuro deficit, stroke. EXAM: CT ANGIOGRAPHY HEAD AND NECK CT PERFUSION BRAIN TECHNIQUE: Multidetector CT imaging of the head and neck was performed using the standard protocol during bolus administration of intravenous contrast. Multiplanar CT image reconstructions and MIPs were obtained to evaluate the vascular anatomy. Carotid stenosis measurements (when applicable) are obtained utilizing NASCET criteria, using the distal internal carotid diameter as the denominator. Multiphase CT imaging of the brain was performed following IV bolus contrast injection. Subsequent parametric perfusion maps were calculated using RAPID software. RADIATION DOSE REDUCTION: This exam was performed according to the departmental dose-optimization program which includes automated exposure control, adjustment of the mA and/or kV according to patient size and/or use of iterative reconstruction technique. CONTRAST:  75mL OMNIPAQUE IOHEXOL 350 MG/ML SOLN COMPARISON:  Prior CT from earlier the same day. FINDINGS: CTA NECK FINDINGS Aortic arch: Visualized arch normal in caliber with standard 3 vessel morphology. Moderate atheromatous change about the arch itself. No significant stenosis about the origin the great vessels. Right carotid system: Right common and internal carotid arteries are patent without dissection. Bulky calcified plaque about the right carotid bulb/proximal right ICA associated stenosis of up to 50% by NASCET criteria. Left carotid system: Left common and internal carotid arteries are patent without evidence for dissection. Bulky calcified  plaque about the left carotid bulb/proximal left ICA. Associated stenosis of up to 75% by NASCET criteria (series 7, image 191). Vertebral arteries: Both vertebral arteries arise from subclavian arteries. No significant proximal subclavian artery stenosis. Atheromatous plaque at the origin of the left vertebral artery with associated moderate ostial stenosis. Additional scattered atheromatous change within the distal left V3 segment with moderate diffuse narrowing (series 7, image 162). The contralateral right vertebral artery is largely occluded at its origin. Scant attenuated distal reconstitution at the right V2 segment via collaterals. Right vertebral artery is attenuated but patent distally to the skull base. No dissection. Skeleton: No discrete or worrisome osseous lesions. Anterior endplate degenerative spurring noted throughout the cervical spine. Other neck: No other acute soft tissue abnormality within the neck. Few thyroid nodules noted, largest of which measures 1.9 cm on the right. These have been previously evaluated by thyroid ultrasound on 05/11/2016 (ref: J Am Coll Radiol. 2015 Feb;12(2): 143-50). Upper chest: Mild emphysematous changes. 4 mm right upper lobe nodule (series 8, image 169), indeterminate. No other acute finding. Review of the MIP images confirms the above findings CTA HEAD FINDINGS Anterior circulation: Atheromatous change seen about the carotid siphons with associated moderate multifocal narrowing on the left and more mild narrowing on the right. A1 segments patent bilaterally. Left A1 hypoplastic. Normal anterior communicating complex. Anterior cerebral arteries patent without significant stenosis. No M1 stenosis or occlusion. Distal right MCA branches  are patent and perfused. On the left, there is an acute stump occlusion of a proximal left M3 branch, inferior division (series 7, image 106). Remainder of the left MCA branches remain patent and perfused. Distal small vessel  atheromatous irregularity noted. Posterior circulation: Atheromatous change within the dominant proximal left V4 segment with mild stenosis. Diminutive right V4 segment attenuated but patent without stenosis. Both PICA patent. Basilar irregular but patent without significant stenosis. Superior cerebral arteries patent bilaterally. Both PCAs primarily supplied via the basilar. Atheromatous irregularity about the PCAs bilaterally with associated short-segment moderate left P2 stenosis (series 11, image 23). PCAs otherwise patent. Venous sinuses: Grossly patent allowing for timing the contrast bolus. Anatomic variants: As above.  No aneurysm. Review of the MIP images confirms the above findings CT Brain Perfusion Findings: ASPECTS: 10 CBF (<30%) Volume: 0mL Perfusion (Tmax>6.0s) volume: 23mL Mismatch Volume: 23mL Infarction Location:Negative CT perfusion for acute core infarct. 23 mL perfusion deficit at the posterior left MCA distribution, in keeping with the above described left M3 occlusion. IMPRESSION: 1. Acute left M3 stump occlusion, inferior division. 2. Negative CT perfusion for acute core infarct. 23 mL perfusion deficit at the posterior left MCA distribution, in keeping with the above described left M3 occlusion. 3. Bulky calcified plaque about the carotid bifurcations/proximal ICAs with associated stenoses of up to 75% on the left and 50% on the right. 4. Occlusion of the right vertebral artery at its origin. Attenuated distal reconstitution at the right V2 segment via collaterals. Atheromatous change about the dominant left V4 segment with associated moderate stenoses at its origin and about the left V3 segment. 5. Atheromatous change about the carotid siphons with associated moderate multifocal narrowing, left worse than right. 6. Short-segment moderate left P2 stenosis. 7. 4 mm right upper lobe nodule, indeterminate. Per Fleischner Society Guidelines, a non-contrast Chest CT at 12 months is optional. If  performed and the nodule is stable at 12 months, no further follow-up is recommended. These guidelines do not apply to immunocompromised patients and patients with cancer. Follow up in patients with significant comorbidities as clinically warranted. For lung cancer screening, adhere to Lung-RADS guidelines. Reference: Radiology. 2017; 284(1):228-43. Aortic Atherosclerosis (ICD10-I70.0) and Emphysema (ICD10-J43.9). Critical Value/emergent results were called by telephone at the time of interpretation on 08/11/2022 at 8:42 pm to provider [facility] , who verbally acknowledged these results. Electronically Signed   By: Rise Mu M.D.   On: 08/11/2022 21:17   CT HEAD CODE STROKE WO CONTRAST  Result Date: 08/11/2022 CLINICAL DATA:  Code stroke. Initial evaluation for neuro deficit, stroke. EXAM: CT HEAD WITHOUT CONTRAST TECHNIQUE: Contiguous axial images were obtained from the base of the skull through the vertex without intravenous contrast. RADIATION DOSE REDUCTION: This exam was performed according to the departmental dose-optimization program which includes automated exposure control, adjustment of the mA and/or kV according to patient size and/or use of iterative reconstruction technique. COMPARISON:  Prior exam from 07/08/2022. FINDINGS: Brain: Age-related cerebral atrophy with moderate chronic microvascular ischemic disease. Remote right cerebellar infarct. Few small remote lacunar infarcts present about the basal adult basal bilateral basal ganglia. No acute intracranial hemorrhage. No acute large vessel territory infarct. No mass lesion, midline shift or mass effect. No hydrocephalus or extra-axial fluid collection. Vascular: No abnormal hyperdense vessel. Calcified atherosclerosis present at the skull base. Skull: Scalp soft tissues and calvarium demonstrate no acute finding. Sinuses/Orbits: Globes and orbits soft tissues within normal limits. Paranasal sinuses are clear. Trace left mastoid  effusion, of doubtful significance.  Other: None. ASPECTS [facility] Stroke Program Early CT Score) - Ganglionic level infarction (caudate, lentiform nuclei, internal capsule, insula, M1-M3 cortex): 7 - Supraganglionic infarction (M4-M6 cortex): 3 Total score (0-10 with 10 being normal): 10 IMPRESSION: 1. No acute intracranial abnormality. 2. ASPECTS is 10. 3. Age-related cerebral atrophy with moderate chronic small vessel ischemic disease. Small remote right cerebellar infarct. These results were communicated to Dr. Otelia Limes at 8:20 pm on 08/11/2022 by text page via the [facility] messaging system. Electronically Signed   By: Rise Mu M.D.   On: 08/11/2022 20:21      HISTORY OF PRESENT ILLNESS Ms. Momoko Chynoweth is a 72 y.o. female with history of arthritis, right basal ganglia stroke, diabetes, hypertension, hyperlipidemia, obesity and sleep apnea presented on 6/26 with acute onset aphasia with right visual field cut.  She presented outside the window for TNK.  She was found to have a left M3 occlusion with 23 mm perfusion deficit, no core and was taken to IR for mechanical thrombectomy.  This procedure was successful with TICI 2c flow achieved.   HOSPITAL COURSE Stroke:  left MCA scattered infarct due to left M3 occlusion s/p IR with TICI2c, etiology: Large vessel disease with left carotid stenosis Code Stroke CT head No acute abnormality. Small vessel disease. Atrophy. ASPECTS 10.    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 Bulky calcified plaque about the carotid bifurcations/proximal ICAs with associated stenoses of up to 75% on the left and 50% on the right. Atheromatous change about the carotid siphons with associated moderate multifocal narrowing, left worse than right CT perfusion no core infarct, 23 mm perfusion deficit at posterior  left M3 distribution IR left M3 occlusion s/p TICI2c with left M4 slow flow. Left ICA 35% stenosis, irregular plaque MRI scattered small acute infarcts in left MCA and left MCA/PCA watershed territories with no hemorrhagic transformation MRA improved appearance of left MCA posterior division branches and residual moderate to severe posterior M2 tandem stenosis 2D Echo EF 70 to 75% LDL 82 HgbA1c 5.8 VTE prophylaxis - lovenox clopidogrel 75 mg daily prior to admission, now on aspirin 81 mg daily and clopidogrel 75 mg daily for 3 weeks and then Plavix alone Therapy recommendations: HH PT Disposition: Pending   Carotid stenosis CTA head & neck Bulky calcified plaque about the carotid bifurcations/proximal ICAs with associated stenoses of up to 75% on the left and 50% on the right. Atheromatous change about the carotid siphons with associated moderate multifocal narrowing, left worse than right IR angiogram showed left ICA stenosis is approximately 35% with irregular plaque No intervention warranted at this time On DAPT   Hx of Stroke/TIA 09/2017 CT showed old left BG lacunar infarct. MRI showed right BG infarct. MRA right V4 stenosis.  Carotid Doppler negative.  EF 55 to 60%, LDL 117, A1c 6.2.  Discharged on DAPT and Lipitor 80.   Right groin hematoma Patient developed right groin hematoma after mechanical thrombectomy Manual pressure held Ultrasound reveals no pseudoaneurysm   History of hypertension  currently hypotensive Home meds: Minoxidil 5 mg daily, carvedilol 25 mg twice daily, spironolactone 25 mg daily, valsartan 320 mg daily - resumed SBP goal less than 180 Long term BP goal 130-150 given intra- and extracranial ICA stenosis follows with [facility] Dr Jodie Echevaria   Hyperlipidemia Home meds: None LDL 82, goal < 70 Rosuvastatin 20 mg  daily Continue statin at discharge   Diabetes type II  Home meds: None HgbA1c 5.8, goal < 7.0 CBGs   Other Stroke Risk Factors Obesity, Body mass  index is 49.91 kg/m., BMI >/= 30 associated with increased stroke risk, recommend weight loss, diet and exercise as appropriate  Obstructive sleep apnea   Other Active Problems Right upper lung nodule-recommend noncontrast chest CT in 12 months UA WBC >50, no antibiotic needed at this time per pharmacy.  RN Pressure Injury Documentation:     DISCHARGE EXAM Blood pressure (!) 174/60, pulse 90, temperature 98.2 F (36.8 C), temperature source Oral, resp. rate 17, height 5\' 4"  (1.626 m), weight 131.9 kg, SpO2 97 %. Constitutional: Appears well-developed and well-nourished.   Cardiovascular: Normal rate and regular rhythm.  Respiratory: Effort normal, non-labored breathing on RA   Neuro: Mental Status: Patient is awake, alert, oriented to person, place, month, year, and situation. Patient is able to give a clear and coherent history. Speech is not fluent, but no aphasia. Fluency is improving  Cranial Nerves: II: Visual Fields are full. Pupils are equal, round, and reactive to light.   III,IV, VI: EOMI without ptosis or diploplia.  V: Facial sensation is symmetric to temperature VII: Facial movement is symmetric resting and smiling VIII: Hearing is intact to voice X: Palate elevates symmetrically XI: Shoulder shrug is symmetric. XII: Tongue protrudes midline without atrophy or fasciculations.  Motor: Tone is normal. Bulk is normal. 5/5 strength present in bilateral upper extremities  4/5 strength in bilateral lower extremities Sensory: Sensation is symmetric to light touch in the arms and legs. Cerebellar: FNF intact bilaterally  Discharge Diet       Diet   Diet heart healthy/carb modified Room service appropriate? Yes with Assist; Fluid consistency: Thin   liquids  DISCHARGE PLAN Disposition:  Home with outpatient PT aspirin 81 mg daily and clopidogrel 75 mg daily for secondary stroke prevention for 3 weeks then Plavix 75mg  alone. Ongoing stroke risk factor control by  Primary Care Physician at time of discharge Follow-up PCP Truett Perna, MD in 2 weeks. Follow-up in [facility] in 8 weeks, office to schedule an appointment.   32 minutes were spent preparing discharge.  Patient seen and examined by NP/APP with MD. MD to update note as needed.   Elmer Picker, DNP, FNP-BC [facility] Pager: [phone]

## 2022-08-14 NOTE — Progress Notes (Signed)
Blood pressure trending up throughout night. PRN for hydralazine received and PO meds changed.

## 2022-08-14 NOTE — Progress Notes (Signed)
Physical Therapy Treatment Patient Details Name: [name] [name] MRN: [id number] DOB: 1950/07/25 Today's Date: 08/14/2022   History of Present Illness 76 female presented 6/27 with garbled speech, found to have L M3 occlusion s/p thrombectomy 6/27. Pt with increased agitation after extubation leading to R groin hematoma and reintubation, extubated 6/28.   PMH includes: CVA, DM HTN HLD Chronic pain arthritis, OSA, bilateral TKA in 2016-2017, and wears glasses.    PT Comments    Today's session focused on progressive functional mobility and activity tolerance. Pt able to sit<>stand with min A, amb x2 bouts of 50' using SPC with one seated rest break due to increased nausea. Pt intermittently swaps their SPC between hands, reporting they have always used it interchangeably, with no notable differences in gait patterns. Pt with more activity tolerance today, increasing distance w amb and showing motivation to make further improvements. Pt will still benefit from skilled PT to increase endurance and progress functional mobility and independence.   Recommendations for follow up therapy are one component of a multi-disciplinary discharge planning process, led by the attending physician.  Recommendations may be updated based on patient status, additional functional criteria and insurance authorization.  Follow Up Recommendations       Assistance Recommended at Discharge Frequent or constant Supervision/Assistance  Patient can return home with the following A little help with walking and/or transfers;A little help with bathing/dressing/bathroom;Assistance with cooking/housework;Assist for transportation;Help with stairs or ramp for entrance   Equipment Recommendations  None recommended by PT    Recommendations for Other Services       Precautions / Restrictions Precautions Precautions: Fall Restrictions Weight Bearing Restrictions: No     Mobility  Bed Mobility Overal bed mobility: Needs  Assistance Bed Mobility: Supine to Sit     Supine to sit: Min guard     General bed mobility comments: increased time, use of bed rail    Transfers Overall transfer level: Needs assistance Equipment used: None Transfers: Sit to/from Stand Sit to Stand: Min assist           General transfer comment: pushes with BUE to achieve slow rise to stand, grasps for external support of SPC at achieved stand    Ambulation/Gait Ambulation/Gait assistance: Min assist Gait Distance (Feet): 100 Feet Assistive device: Straight cane Gait Pattern/deviations: Step-through pattern, Wide base of support, Shuffle Gait velocity: reduced Gait velocity interpretation: <1.8 ft/sec, indicate of risk for recurrent falls   General Gait Details: swaps SPC between R and L hand, intermittently reaches out for hallway rail but accepts cues to maintain SPC only with no changes in gait. 1x seated rest break due to nausea   Stairs             Wheelchair Mobility    Modified Rankin (Stroke Patients Only) Modified Rankin (Stroke Patients Only) Pre-Morbid Rankin Score: No significant disability Modified Rankin: Moderately severe disability     Balance Overall balance assessment: Needs assistance Sitting-balance support: No upper extremity supported, Feet supported Sitting balance-Leahy Scale: Fair     Standing balance support: Single extremity supported, During functional activity Standing balance-Leahy Scale: Fair Standing balance comment: Able to rise to stand but reaches for [facility] once in static standing                            Cognition Arousal/Alertness: Awake/alert Behavior During Therapy: WFL for tasks assessed/performed Overall Cognitive Status: Impaired/Different from baseline Area of Impairment: Safety/judgement, Awareness, Problem solving  Safety/Judgement: Decreased awareness of safety, Decreased awareness of deficits Awareness:  Emergent Problem Solving: Difficulty sequencing, Slow processing General Comments: some difficulties with expression but able to navigate to correct.        Exercises      General Comments        Pertinent Vitals/Pain Pain Assessment Pain Assessment: No/denies pain    Home Living                          Prior Function            PT Goals (current goals can now be found in the care plan section) Acute Rehab PT Goals Patient Stated Goal: return home PT Goal Formulation: With patient Time For Goal Achievement: 08/27/22 Potential to Achieve Goals: Good Progress towards PT goals: Progressing toward goals    Frequency    Min 3X/week      PT Plan Current plan remains appropriate    Co-evaluation              AM-PAC PT "6 Clicks" Mobility   Outcome Measure  Help needed turning from your back to your side while in a flat bed without using bedrails?: A Little Help needed moving from lying on your back to sitting on the side of a flat bed without using bedrails?: A Little Help needed moving to and from a bed to a chair (including a wheelchair)?: A Little Help needed standing up from a chair using your arms (e.g., wheelchair or bedside chair)?: A Little Help needed to walk in hospital room?: A Little Help needed climbing 3-5 steps with a railing? : A Lot 6 Click Score: 17    End of Session Equipment Utilized During Treatment: Gait belt Activity Tolerance: Patient tolerated treatment well Patient left: in chair;with call bell/phone within reach;with chair alarm set Nurse Communication: Mobility status PT Visit Diagnosis: Unsteadiness on feet (R26.81);Other abnormalities of gait and mobility (R26.89)     Time: 1345-1420 PT Time Calculation (min) (ACUTE ONLY): 35 min  Charges:  $Gait Training: 8-22 mins $Therapeutic Activity: 8-22 mins                     Hendricks Milo, SPT  Acute Rehabilitation Services    Hendricks Milo 08/14/2022,  3:20 PM

## 2022-08-14 NOTE — Progress Notes (Signed)
eLink Physician-Brief Progress Note Patient Name: [name] [name] DOB: 1950/03/06 MRN: [id number]   Date of Service  08/14/2022  HPI/Events of Note  BP  remains elevated  eICU Interventions  Begin norvasc 5 mg daily     Intervention Category Intermediate Interventions: Hypertension - evaluation and management  Henry Russel, P 08/14/2022, 6:32 AM

## 2022-08-14 NOTE — Progress Notes (Signed)
eLink Physician-Brief Progress Note Patient Name: Shi Winchell DOB: 06/13/1950 MRN: [id number]   Date of Service  08/14/2022  HPI/Events of Note  Labile bp.  Now 203/87  eICU Interventions  Stop midodrine Hydralazine prn     Intervention Category Intermediate Interventions: Hypertension - evaluation and management  Henry Russel, P 08/14/2022, 2:29 AM

## 2022-08-15 DIAGNOSIS — I63312 Cerebral infarction due to thrombosis of left middle cerebral artery: Secondary | ICD-10-CM

## 2022-08-15 LAB — RENAL FUNCTION PANEL
Albumin: 3.3 g/dL — ABNORMAL LOW (ref 3.5–5.0)
Anion gap: 6 (ref 5–15)
BUN: 6 mg/dL — ABNORMAL LOW (ref 8–23)
CO2: 25 mmol/L (ref 22–32)
Calcium: 9.5 mg/dL (ref 8.9–10.3)
Chloride: 109 mmol/L (ref 98–111)
Creatinine, Ser: 0.69 mg/dL (ref 0.44–1.00)
GFR, Estimated: >60 mL/min (ref >60)
Glucose, Bld: 101 mg/dL — ABNORMAL HIGH (ref 70–99)
Phosphorus: 2.9 mg/dL (ref 2.5–4.6)
Potassium: 3.5 mmol/L (ref 3.5–5.1)
Sodium: 140 mmol/L (ref 135–145)

## 2022-08-15 LAB — CBC
HCT: 32.5 % — ABNORMAL LOW (ref 36.0–46.0)
Hemoglobin: 10.4 g/dL — ABNORMAL LOW (ref 12.0–15.0)
MCH: 26.3 pg (ref 26.0–34.0)
MCHC: 32 g/dL (ref 30.0–36.0)
MCV: 82.3 fL (ref 80.0–100.0)
Platelets: 197 10*3/uL (ref 150–400)
RBC: 3.95 MIL/uL (ref 3.87–5.11)
RDW: 16.8 % — ABNORMAL HIGH (ref 11.5–15.5)
WBC: 5.4 10*3/uL (ref 4.0–10.5)
nRBC: 0 % (ref 0.0–0.2)

## 2022-08-15 LAB — GLUCOSE, CAPILLARY
Glucose-Capillary: 87 mg/dL (ref 70–99)
Glucose-Capillary: 100 mg/dL — ABNORMAL HIGH (ref 70–99)
Glucose-Capillary: 104 mg/dL — ABNORMAL HIGH (ref 70–99)

## 2022-08-15 MED ORDER — MINOXIDIL 10 MG PO TABS: 10.0000 mg | ORAL_TABLET | Freq: Every day | ORAL | 1 refills | Status: DC

## 2022-08-15 MED ORDER — CARVEDILOL 25 MG PO TABS: 25.0000 mg | ORAL_TABLET | Freq: Two times a day (BID) | ORAL | 1 refills | Status: DC

## 2022-08-15 MED ORDER — ASPIRIN 81 MG PO CHEW: 81.0000 mg | CHEWABLE_TABLET | Freq: Every day | ORAL | 0 refills | Status: DC

## 2022-08-15 MED ORDER — AMLODIPINE BESYLATE 5 MG PO TABS: 5.0000 mg | ORAL_TABLET | Freq: Every day | ORAL | 1 refills | Status: DC

## 2022-08-15 NOTE — Progress Notes (Signed)
Occupational Therapy Treatment Patient Details Name: [name] [name] MRN: [id number] DOB: Aug 06, 1950 Today's Date: 08/15/2022   History of present illness 55 female presented 6/27 with garbled speech, found to have L M3 occlusion s/p thrombectomy 6/27. Pt with increased agitation after extubation leading to R groin hematoma and reintubation, extubated 6/28.   PMH includes: CVA, DM HTN HLD Chronic pain arthritis, OSA, bilateral TKA in 2016-2017, and wears glasses.   OT comments  Pt. Motivated and doing well with skilled OT treatment session.  Able to complete bed mobility, ambulation to/from b.room for simulated toileting task, and LB dressing task all with set up/min guard a.  Reports having all dme.  Eager for cont. Out patient therapy and states ST is her focus as she is frustrated with word finding delays.  States she wants out patient vs. HH therapies.     Recommendations for follow up therapy are one component of a multi-disciplinary discharge planning process, led by the attending physician.  Recommendations may be updated based on patient status, additional functional criteria and insurance authorization.    Assistance Recommended at Discharge    Patient can return home with the following  A little help with walking and/or transfers;A little help with bathing/dressing/bathroom   Equipment Recommendations  None recommended by OT    Recommendations for Other Services Speech consult    Precautions / Restrictions Precautions Precautions: Fall Precaution Comments: watch BP       Mobility Bed Mobility Overal bed mobility: Needs Assistance Bed Mobility: Supine to Sit, Sit to Supine     Supine to sit: Min guard Sit to supine: Supervision        Transfers Overall transfer level: Needs assistance Equipment used: Rolling walker (2 wheels) Transfers: Sit to/from Stand, Bed to chair/wheelchair/BSC Sit to Stand: Min guard     Step pivot transfers: Min guard            Balance                                           ADL either performed or assessed with clinical judgement   ADL Overall ADL's : Needs assistance/impaired                     Lower Body Dressing: Set up;Sitting/lateral leans Lower Body Dressing Details (indicate cue type and reason): pt. able to demo don/doff pants seated eob, describes sock aide that she uses at home or says sometimes her g.dtr assists Toilet Transfer: Supervision/safety;Ambulation;Rolling walker (2 wheels);BSC/3in1;Regular Toilet           Functional mobility during ADLs: Min guard;Supervision/safety General ADL Comments: pt at baseline using w/c and transfer minimal distances. pt reports using electric scooter in grocery store    Extremity/Trunk Assessment              Vision       Perception     Praxis      Cognition                                                Exercises      Shoulder Instructions       General Comments      Pertinent Vitals/ Pain  Pain Assessment Pain Assessment: No/denies pain  Home Living                                          Prior Functioning/Environment              Frequency  Min 2X/week        Progress Toward Goals  OT Goals(current goals can now be found in the care plan section)  Progress towards OT goals: Progressing toward goals     Plan Discharge plan remains appropriate    Co-evaluation                 AM-PAC OT "6 Clicks" Daily Activity     Outcome Measure   Help from another person eating meals?: A Little Help from another person taking care of personal grooming?: A Little Help from another person toileting, which includes using toliet, bedpan, or urinal?: A Lot Help from another person bathing (including washing, rinsing, drying)?: A Lot Help from another person to put on and taking off regular upper body clothing?: A Little Help from another  person to put on and taking off regular lower body clothing?: A Lot 6 Click Score: 15    End of Session Equipment Utilized During Treatment: Rolling walker (2 wheels)  OT Visit Diagnosis: Unsteadiness on feet (R26.81);Muscle weakness (generalized) (M62.81)   Activity Tolerance Patient tolerated treatment well   Patient Left in bed;with call bell/phone within reach;with bed alarm set   Nurse Communication          Time: 2956-2130 OT Time Calculation (min): 13 min  Charges: OT General Charges $OT Visit: 1 Visit OT Treatments $Self Care/Home Management : 8-22 mins  Boneta Lucks, COTA/L Acute Rehabilitation [phone]   [name] [name] 08/15/2022, 12:37 PM

## 2022-08-15 NOTE — Progress Notes (Signed)
RNCM received order from provider for HHPT/OT.  RNCM spoke to patient and she declined HH services.  Patient states she would prefer outpatient therapy services as she does not want anyone coming to her daughter's home.  RNCM notified provider that patient prefers outpatient services.

## 2022-08-16 ENCOUNTER — Other Ambulatory Visit: Payer: Self-pay | Admitting: Home Health

## 2022-08-16 LAB — GLUCOSE, CAPILLARY
Glucose-Capillary: 97 mg/dL (ref 70–99)
Glucose-Capillary: 132 mg/dL — ABNORMAL HIGH (ref 70–99)
Glucose-Capillary: 100 mg/dL — ABNORMAL HIGH (ref 70–99)

## 2022-09-09 ENCOUNTER — Other Ambulatory Visit: Payer: Self-pay | Admitting: Neurology

## 2022-09-09 ENCOUNTER — Telehealth: Payer: Self-pay | Admitting: Physical Therapy

## 2022-09-09 ENCOUNTER — Ambulatory Visit: Payer: 59 | Admitting: Physical Therapy

## 2022-09-09 DIAGNOSIS — I63311 Cerebral infarction due to thrombosis of right middle cerebral artery: Secondary | ICD-10-CM

## 2022-09-09 DIAGNOSIS — R4701 Aphasia: Secondary | ICD-10-CM

## 2022-09-09 NOTE — Telephone Encounter (Signed)
Dr. Pearlean Brownie,  [name] [name] was referred to our clinic for physical therapy after her stroke. The patient would benefit from a Speech Therapy evaluation for expressive aphasia.    If you agree, please place an order in [facility] in [facility] or fax the order to [phone]. Thank you, Peter Congo, PT, DPT, [facility] [address] [place], [place]  [address] Phone:  [phone] Fax:  [phone]

## 2022-09-09 NOTE — Therapy (Signed)
[facility] [address] [place], [place], [address] Phone: [phone]   Fax:  [phone]  Patient Details  Name: [name] [name] MRN: [id number] Date of Birth: April 08, 1950 Referring Provider:  Elmer Picker, NP  Encounter Date: 09/09/2022  Pt arrived to scheduled PT evaluation, states that she doesn't feel that she needs PT at this time as she feels like she is walking and moving around at her baseline level. Pt is requesting speech therapy services due to some expressive aphasia, request sent to referring provider/patient's neurologist for a speech therapy referral.    Peter Congo, PT, DPT, CSRS  09/09/2022, 1:36 PM  [facility] [address] [place], [place], [address] Phone: [phone]   Fax:  [phone]

## 2022-09-14 NOTE — Therapy (Unsigned)
OUTPATIENT SPEECH LANGUAGE PATHOLOGY APHASIA EVALUATION   Patient Name: [name] [name] MRN: [id number] DOB:07-05-1950, 72 y.o., female Today's Date: 09/15/2022  PCP: [name] Perna, MD REFERRING PROVIDER: Micki Riley, MD  END OF SESSION:  End of Session - 09/15/22 1131     Visit Number 1    Number of Visits 17    Date for SLP Re-Evaluation 11/10/22    Authorization Type [employer] Medicare & Medicaid    SLP Start Time 0847    SLP Stop Time  0930    SLP Time Calculation (min) 43 min    Activity Tolerance Patient tolerated treatment well             Past Medical History:  Diagnosis Date   Arthritis    "right knee" (07/02/2015)   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   Diabetes (HCC)    type II   Hyperlipidemia    Hypertension    Obesity    OSA (obstructive sleep apnea)    Overactive bladder    S/P right knee arthroscopy    with septic joint growing out MSSA   Septic arthritis of knee, right (HCC)    Wears glasses    Past Surgical History:  Procedure Laterality Date   BIOPSY  08/05/2020   Procedure: BIOPSY;  Surgeon: Kathi Der, MD;  Location: [facility];  Service: Gastroenterology;;   COLONOSCOPY     COLONOSCOPY WITH PROPOFOL N/A 08/05/2020   Procedure: COLONOSCOPY WITH PROPOFOL;  Surgeon: Kathi Der, MD;  Location: [facility];  Service: Gastroenterology;  Laterality: N/A;   DIAGNOSTIC LAPAROSCOPY     IR CT HEAD LTD  08/12/2022   IR PERCUTANEOUS ART THROMBECTOMY/INFUSION INTRACRANIAL INC DIAG ANGIO  08/12/2022   IR US GUIDE VASC ACCESS RIGHT  08/12/2022   IR US GUIDE VASC ACCESS RIGHT  08/12/2022   JOINT REPLACEMENT     KNEE ARTHROSCOPY Right 12/09/2012   Procedure: ARTHROSCOPIC IRRIGATION AND DEBRIDEMENT RIGHT KNEE;  Surgeon: Budd Palmer, MD;  Location: [facility] OR;  Service: Orthopedics;  Laterality: Right;   LEFT HEART CATH AND CORONARY ANGIOGRAPHY N/A 09/02/2016   Procedure: Left Heart Cath and Coronary Angiography;  Surgeon:  Rinaldo Cloud, MD;  Location: [facility] INVASIVE CV LAB;  Service: Cardiovascular;  Laterality: N/A;   RADIOLOGY WITH ANESTHESIA N/A 08/11/2022   Procedure: IR WITH ANESTHESIA;  Surgeon: Julieanne Cotton, MD;  Location: [facility] OR;  Service: Radiology;  Laterality: N/A;   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/06/2014   Procedure: PLACEMENT OF CEMENT SPACER;  Surgeon: Loreta Ave, MD;  Location: [facility] OR;  Service: Orthopedics;  Laterality: Right;   TOTAL KNEE ARTHROPLASTY Bilateral 2011-2012   left-right   TOTAL KNEE REVISION Right 11/06/2014   Procedure: REMOVAL OF TOTAL COMPONENTS, EXTENSIVE  IRRIGATION AND DEBRIDEMENT  RIGHT KNEE;  Surgeon: Loreta Ave, MD;  Location: [facility] OR;  Service: Orthopedics;  Laterality: Right;   TOTAL KNEE REVISION Right 07/02/2015   TOTAL KNEE REVISION Right 07/02/2015   Procedure: TOTAL KNEE REVISION;  Surgeon: Loreta Ave, MD;  Location: [facility] OR;  Service: Orthopedics;  Laterality: Right;   TUBAL LIGATION  ~ 1980   tummy tuck  1980s   Patient Active Problem List   Diagnosis Date Noted   Hematoma of groin 08/12/2022   Endotracheal tube present 08/12/2022   AKI (acute kidney injury) (HCC) 08/12/2022   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/11/2022   Stroke (HCC) 10/06/2017   Acute CVA (cerebrovascular accident) (HCC) 10/06/2017  Trigger thumb, right thumb 05/03/2016   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   Protein-calorie malnutrition (HCC) 11/15/2014   Osteomyelitis of right knee region (HCC) 11/08/2014   Complication of device 11/04/2014   Septic arthritis (HCC) 11/04/2014   S/P right knee arthroscopy    UTI (urinary tract infection) 12/09/2012   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 female with history of arthritis, right basal ganglia stroke, diabetes, hypertension, hyperlipidemia, obesity and sleep apnea presented on 6/26 with acute onset aphasia with right visual field cut. She presented outside the window for TNK. She was found to have a left M3 occlusion with 23 mm perfusion deficit, no core and was taken to IR for mechanical thrombectomy. This procedure was successful with TICI 2c flow achieved. "  PAIN: Are you having pain? No  FALLS: Has patient fallen in last 6 months?  No  LIVING ENVIRONMENT: Lives with: lives with their daughter Lives in: House/apartment  PLOF:  Level of assistance: Independent with ADLs, Independent with IADLs Employment: Retired  PATIENT GOALS: "Get my words back."  OBJECTIVE:   COGNITION: Overall cognitive status: Within functional limits for tasks assessed  AUDITORY COMPREHENSION: Overall auditory comprehension: Appears intact YES/NO questions: Appears intact Following directions: Appears intact Conversation: Simple Comments: Pt states no difficulty with auditory comprehension, and is able to follow simple directions well Effective technique: pausing, repetition/stressing words, slowed speech, and written cues  READING COMPREHENSION: Intact  EXPRESSION: verbal  VERBAL EXPRESSION: Level of generative/spontaneous verbalization: conversation Automatic speech: name: intact and social response: intact  Repetition: Appears intact Naming: Confrontation: 51-75% Pragmatics: Appears intact Interfering components:  increased rate or speech Effective technique: open ended questions, semantic cues, sentence completion, phonemic cues, and written cues Non-verbal means of communication: N/A Comments: reduced articulatory  precision as rate increased, intermittent phonemic and semantic paraphasias, occasional anomia with attempted description  ORAL MOTOR EXAMINATION: Overall status: WNL Comments: Decreased coordination for SMR when cued to increase rate of speech.   MOTOR SPEECH: Overall motor speech: impaired Level of impairment:  Conversation Respiration: thoracic breathing, clavicular breathing, and speaking on residual capacity Phonation: normal Resonance: WFL Articulation: Appears intact Intelligibility: Intelligible Motor planning: Impaired: inconsistent Motor speech errors: aware, unaware, and inconsistent Interfering components:  Speech rate  Effective technique: slow rate, over articulate, and pacing   STANDARDIZED ASSESSMENTS: [place] NAMING TEST: 43 /60 (self-corrections x11; semantic paraphasias x3; phonemic paraphasias x9)  PATIENT REPORTED OUTCOME MEASURES (PROM): Communication Participation Item Bank: 16/30 Complaining most difficulty when communicating with others, participating in long conversations, and giving someone detailed conversation.    TODAY'S TREATMENT:  09-14-22: Patient demonstrates a fast rate of speech, which decreases intelligibility and articulatory precision. States that the longer she speaks the more trouble she will have finding the words, which was evidenced today. Education provided on relaxation techniques and use of slow rate  in order to increase fluency. Intermittent semantic and phonemic paraphasias exhibited. Pt required occasional mod-A of semantic/phonemic cues as well as sentence completion to aid anomia and correct paraphasias. HEP given to read passages aloud to practice speech and to be intentional about slowing speech.    PATIENT EDUCATION: Education details: See above Person educated: Patient Education method:  Explanation, Demonstration, and Verbal cues Education comprehension: verbalized understanding and needs further education   GOALS: Goals reviewed with patient? Yes  SHORT TERM GOALS: Target date: 10/13/2022  Pt will report daily HEP completion over 1 week period  Baseline: Goal status: INITIAL  2.  Pt will ID and attempt to self correct paraphasias in 3/5 opportunities in a structured task across 2 sessions given mod-A Baseline:  Goal status: INITIAL  3.  Pt will use verbal compensations 4/5 word finding episodes during structured tasks with occasional min A  Baseline:  Goal status: INITIAL  4.  Pt will successfully carryover trained techniques to effectively communicate in 2 5-minute conversations given mod-A. Baseline:  Goal status: INITIAL  LONG TERM GOALS: Target date: 11/10/2022  Pt will ID and self correct paraphasias in 80% of opportunities during a 10+ minute structured conversation given occasional min-A. Baseline:  Goal status: INITIAL  2.  Pt will utilize word retrieval strategies to optimize verbal expression during a 10+ minute structured conversation given occasional min-A. Baseline:  Goal status: INITIAL  3.  Pt will be 100% intelligible in conversation utilizing trained strategies during a 10+ minute structured conversation given occasional min-A. Baseline:  Goal status: INITIAL  4.  Pt will report improved communication effectiveness by 2 points via PROM by d/c Baseline: CPIB=16 Goal status: INITIAL   ASSESSMENT:  CLINICAL IMPRESSION: Patient is a 72 y.o. F who was seen today for aphasia s/p CVA in June 2024. Pt c/o frequent word finding episodes and mumbling in conversation resulting in family members requesting repetition and clarification. 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 Pt's expressive language is c/b increased speech errors (semantic & phonemic paraphasias and reduced articulatory precision) as conversation  length and rate of speech increases. Given functional impact of ability to effectively and confidently communicate, pt would benefit from skilled ST to address aforementioned deficits to enhance communication efficacy.    OBJECTIVE IMPAIRMENTS: include aphasia, apraxia, and dysarthria. These impairments are limiting patient from effectively communicating at home and in community.Factors affecting potential to achieve goals and functional outcome are ability to learn/carryover information, cooperation/participation level, previous level of function, and family/community support. Patient will benefit from skilled SLP services to address above impairments and improve overall function.  REHAB POTENTIAL: Good  PLAN:  SLP FREQUENCY: 2x/week  SLP DURATION: 8 weeks  PLANNED INTERVENTIONS: Language facilitation, Cueing hierachy, Oral motor exercises, Functional tasks, SLP instruction and feedback, Compensatory strategies, and Patient/family education    Gracy Racer, CCC-SLP 09/15/2022, 11:32 AM

## 2022-09-15 ENCOUNTER — Ambulatory Visit: Payer: 59 | Attending: Neurology

## 2022-09-15 ENCOUNTER — Other Ambulatory Visit: Payer: Self-pay

## 2022-09-15 DIAGNOSIS — R471 Dysarthria and anarthria: Secondary | ICD-10-CM | POA: Diagnosis present

## 2022-09-15 DIAGNOSIS — R482 Apraxia: Secondary | ICD-10-CM | POA: Insufficient documentation

## 2022-09-15 DIAGNOSIS — R4701 Aphasia: Secondary | ICD-10-CM | POA: Diagnosis present

## 2022-09-15 NOTE — Addendum Note (Signed)
Addended by: Zena Amos I on: 09/15/2022 12:27 PM   Modules accepted: Orders

## 2022-09-20 NOTE — Therapy (Unsigned)
OUTPATIENT SPEECH LANGUAGE PATHOLOGY APHASIA TREATMENT   Patient Name: [name] [name] MRN: [id number] DOB:1950/11/14, 72 y.o., female Today's Date: 09/21/2022  PCP: [name] Perna, MD REFERRING PROVIDER: Micki Riley, MD  END OF SESSION:  End of Session - 09/21/22 1233     Visit Number 2    Number of Visits 17    Date for SLP Re-Evaluation 11/10/22    Authorization Type [employer] Medicare & Medicaid    SLP Start Time 1231    SLP Stop Time  1315    SLP Time Calculation (min) 44 min    Activity Tolerance Patient tolerated treatment well              Past Medical History:  Diagnosis Date   Arthritis    "right knee" (07/02/2015)   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   Diabetes (HCC)    type II   Hyperlipidemia    Hypertension    Obesity    OSA (obstructive sleep apnea)    Overactive bladder    S/P right knee arthroscopy    with septic joint growing out MSSA   Septic arthritis of knee, right (HCC)    Wears glasses    Past Surgical History:  Procedure Laterality Date   BIOPSY  08/05/2020   Procedure: BIOPSY;  Surgeon: [name] Der, MD;  Location: [facility];  Service: Gastroenterology;;   COLONOSCOPY     COLONOSCOPY WITH PROPOFOL N/A 08/05/2020   Procedure: COLONOSCOPY WITH PROPOFOL;  Surgeon: [name] Der, MD;  Location: [facility];  Service: Gastroenterology;  Laterality: N/A;   DIAGNOSTIC LAPAROSCOPY     IR CT HEAD LTD  08/12/2022   IR PERCUTANEOUS ART THROMBECTOMY/INFUSION INTRACRANIAL INC DIAG ANGIO  08/12/2022   IR US GUIDE VASC ACCESS RIGHT  08/12/2022   IR US GUIDE VASC ACCESS RIGHT  08/12/2022   JOINT REPLACEMENT     KNEE ARTHROSCOPY Right 12/09/2012   Procedure: ARTHROSCOPIC IRRIGATION AND DEBRIDEMENT RIGHT KNEE;  Surgeon: [name] Palmer, MD;  Location: [facility] OR;  Service: Orthopedics;  Laterality: Right;   LEFT HEART CATH AND CORONARY ANGIOGRAPHY N/A 09/02/2016   Procedure: Left Heart Cath and Coronary Angiography;  Surgeon:  [name] Cloud, MD;  Location: [facility] INVASIVE CV LAB;  Service: Cardiovascular;  Laterality: N/A;   RADIOLOGY WITH ANESTHESIA N/A 08/11/2022   Procedure: IR WITH ANESTHESIA;  Surgeon: [name] Cotton, MD;  Location: [facility] OR;  Service: Radiology;  Laterality: N/A;   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/06/2014   Procedure: PLACEMENT OF CEMENT SPACER;  Surgeon: [name] Ave, MD;  Location: [facility] OR;  Service: Orthopedics;  Laterality: Right;   TOTAL KNEE ARTHROPLASTY Bilateral 2011-2012   left-right   TOTAL KNEE REVISION Right 11/06/2014   Procedure: REMOVAL OF TOTAL COMPONENTS, EXTENSIVE  IRRIGATION AND DEBRIDEMENT  RIGHT KNEE;  Surgeon: [name] Ave, MD;  Location: [facility] OR;  Service: Orthopedics;  Laterality: Right;   TOTAL KNEE REVISION Right 07/02/2015   TOTAL KNEE REVISION Right 07/02/2015   Procedure: TOTAL KNEE REVISION;  Surgeon: [name] Ave, MD;  Location: [facility] OR;  Service: Orthopedics;  Laterality: Right;   TUBAL LIGATION  ~ 1980   tummy tuck  1980s   Patient Active Problem List   Diagnosis Date Noted   Hematoma of groin 08/12/2022   Endotracheal tube present 08/12/2022   AKI (acute kidney injury) (HCC) 08/12/2022   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/11/2022   Stroke (HCC) 10/06/2017   Acute CVA (cerebrovascular accident) (HCC) 10/06/2017  Trigger thumb, right thumb 05/03/2016   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   Protein-calorie malnutrition (HCC) 11/15/2014   Osteomyelitis of right knee region (HCC) 11/08/2014   Complication of device 11/04/2014   Septic arthritis (HCC) 11/04/2014   S/P right knee arthroscopy    UTI (urinary tract infection) 12/09/2012   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 female with history of arthritis, right basal ganglia stroke, diabetes, hypertension, hyperlipidemia, obesity and sleep apnea presented on 6/26 with acute onset aphasia with right visual field cut. She presented outside the window for TNK. She was found to have a left M3 occlusion with 23 mm perfusion deficit, no core and was taken to IR for mechanical thrombectomy. This procedure was successful with TICI 2c flow achieved. "  PAIN: Are you having pain? No  FALLS: Has patient fallen in last 6 months?  No  LIVING ENVIRONMENT: Lives with: lives with their daughter Lives in: House/apartment  PLOF:  Level of assistance: Independent with ADLs, Independent with IADLs Employment: Retired  PATIENT GOALS: "Get my words back."  OBJECTIVE:   TODAY'S TREATMENT:                                                                                                                                          09-21-22: Pt endorsed some difficulty maintaining slower rate in conversation with intermittent frustration reported and exhibited when speech errors occurred. Re-iterated importance of intentional slow rate and reducing tension to optimize performance, with usual cues required to carryover in today's session. Targeted apraxia strategies at word level, including intentional slow rate and syllable segmentation. Pt was 84% accurate with occasional self-corrections. Intermittent mod A required to break down syllables and correct errors. In subsequent conversation, pt presented with rare errors with inconsistent awareness. When aware, pt often able to self-correct with use of cued writing to aid anomia and correct apraxic errors.   09-14-22: Patient demonstrates a fast rate of speech, which decreases intelligibility and articulatory precision. States that the longer  she speaks the more trouble she will have finding the words, which was evidenced today. Education provided on relaxation techniques and use of slow rate  in order to increase fluency. Intermittent semantic and phonemic paraphasias exhibited. Pt required occasional mod-A of semantic/phonemic cues as well as sentence completion to aid anomia and correct paraphasias. HEP given to read passages aloud to practice speech and to be intentional about slowing speech.    PATIENT EDUCATION: Education details: See above Person  educated: Patient Education method: Explanation, Demonstration, and Verbal cues Education comprehension: verbalized understanding and needs further education   GOALS: Goals reviewed with patient? Yes  SHORT TERM GOALS: Target date: 10/13/2022  Pt will report daily HEP completion over 1 week period  Baseline: Goal status: IN PROGRESS  2.  Pt will ID and attempt to self correct paraphasias in 3/5 opportunities in a structured task across 2 sessions given mod-A Baseline:  Goal status: IN PROGRESS  3.  Pt will use verbal compensations 4/5 word finding episodes during structured tasks with occasional min A  Baseline:  Goal status: IN PROGRESS  4.  Pt will successfully carryover trained techniques to effectively communicate in 2 5-minute conversations given mod-A. Baseline:  Goal status: IN PROGRESS  LONG TERM GOALS: Target date: 11/10/2022  Pt will ID and self correct paraphasias in 80% of opportunities during a 10+ minute structured conversation given occasional min-A. Baseline:  Goal status: IN PROGRESS  2.  Pt will utilize word retrieval strategies to optimize verbal expression during a 10+ minute structured conversation given occasional min-A. Baseline:  Goal status: IN PROGRESS  3.  Pt will be 100% intelligible in conversation utilizing trained strategies during a 10+ minute structured conversation given occasional min-A. Baseline:  Goal status: IN PROGRESS  4.   Pt will report improved communication effectiveness by 2 points via PROM by d/c Baseline: CPIB=16 Goal status: IN PROGRESS   ASSESSMENT:  CLINICAL IMPRESSION: Patient is a 72 y.o. F who was seen today for aphasia s/p CVA in June 2024. Pt c/o frequent word finding episodes and mumbling in conversation resulting in family members requesting repetition and clarification. 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 Pt's expressive language is c/b increased speech errors (semantic & phonemic paraphasias and reduced articulatory precision) as conversation length and rate of speech increases. Given functional impact of ability to effectively and confidently communicate, pt would benefit from skilled ST to address aforementioned deficits to enhance communication efficacy.    OBJECTIVE IMPAIRMENTS: include aphasia, apraxia, and dysarthria. These impairments are limiting patient from effectively communicating at home and in community.Factors affecting potential to achieve goals and functional outcome are ability to learn/carryover information, cooperation/participation level, previous level of function, and family/community support. Patient will benefit from skilled SLP services to address above impairments and improve overall function.  REHAB POTENTIAL: Good  PLAN:  SLP FREQUENCY: 2x/week  SLP DURATION: 8 weeks  PLANNED INTERVENTIONS: Language facilitation, Cueing hierachy, Oral motor exercises, Functional tasks, SLP instruction and feedback, Compensatory strategies, and Patient/family education    Gracy Racer, CCC-SLP 09/21/2022, 12:33 PM

## 2022-09-21 ENCOUNTER — Ambulatory Visit: Payer: 59 | Attending: Neurology

## 2022-09-21 DIAGNOSIS — R482 Apraxia: Secondary | ICD-10-CM | POA: Insufficient documentation

## 2022-09-21 DIAGNOSIS — R471 Dysarthria and anarthria: Secondary | ICD-10-CM | POA: Diagnosis present

## 2022-09-21 DIAGNOSIS — R4701 Aphasia: Secondary | ICD-10-CM | POA: Insufficient documentation

## 2022-09-21 NOTE — Patient Instructions (Signed)
Slow down & relax - going faster and beating yourself up is making it worse!  If you make an error,  Slow down Break down the syllables Write the word then write it how it sounds (bunny, bun-e)  Once it's correct, say 3x in a row   If you are stuck, use a sentence starter Take out the....garage Let me ask you a.... question     Cueing Hierarchy:  Description - define the word or provide the function First letter - provide the first letter of the target word Spell - spell the target word, orally Phrase Completion - give a phrase to complete First sound - provide the first sound of the target word Model - say the word aloud to repeat   Example target: "mug" The thing you put coffee in Starts with m M - U - G Fill my coffee _____ "Muh" Mug

## 2022-09-22 ENCOUNTER — Ambulatory Visit: Payer: 59 | Admitting: Speech Pathology

## 2022-09-22 ENCOUNTER — Ambulatory Visit: Payer: 59

## 2022-09-22 DIAGNOSIS — R471 Dysarthria and anarthria: Secondary | ICD-10-CM

## 2022-09-22 DIAGNOSIS — R4701 Aphasia: Secondary | ICD-10-CM | POA: Diagnosis not present

## 2022-09-22 DIAGNOSIS — R482 Apraxia: Secondary | ICD-10-CM

## 2022-09-22 NOTE — Therapy (Signed)
OUTPATIENT SPEECH LANGUAGE PATHOLOGY APHASIA TREATMENT   Patient Name: [name] [name] MRN: [id number] DOB:1950/10/11, 72 y.o., female Today's Date: 09/22/2022  PCP: Truett Perna, MD REFERRING PROVIDER: Micki Riley, MD  END OF SESSION:  End of Session - 09/22/22 1405     Visit Number 3    Number of Visits 17    Date for SLP Re-Evaluation 11/10/22    Authorization Type [employer] Medicare & Medicaid    SLP Start Time 1404    SLP Stop Time  1445    SLP Time Calculation (min) 41 min    Activity Tolerance Patient tolerated treatment well              Past Medical History:  Diagnosis Date   Arthritis    "right knee" (07/02/2015)   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   Diabetes (HCC)    type II   Hyperlipidemia    Hypertension    Obesity    OSA (obstructive sleep apnea)    Overactive bladder    S/P right knee arthroscopy    with septic joint growing out MSSA   Septic arthritis of knee, right (HCC)    Wears glasses    Past Surgical History:  Procedure Laterality Date   BIOPSY  08/05/2020   Procedure: BIOPSY;  Surgeon: Kathi Der, MD;  Location: [facility];  Service: Gastroenterology;;   COLONOSCOPY     COLONOSCOPY WITH PROPOFOL N/A 08/05/2020   Procedure: COLONOSCOPY WITH PROPOFOL;  Surgeon: Kathi Der, MD;  Location: [facility];  Service: Gastroenterology;  Laterality: N/A;   DIAGNOSTIC LAPAROSCOPY     IR CT HEAD LTD  08/12/2022   IR PERCUTANEOUS ART THROMBECTOMY/INFUSION INTRACRANIAL INC DIAG ANGIO  08/12/2022   IR US GUIDE VASC ACCESS RIGHT  08/12/2022   IR US GUIDE VASC ACCESS RIGHT  08/12/2022   JOINT REPLACEMENT     KNEE ARTHROSCOPY Right 12/09/2012   Procedure: ARTHROSCOPIC IRRIGATION AND DEBRIDEMENT RIGHT KNEE;  Surgeon: Budd Palmer, MD;  Location: [facility];  Service: Orthopedics;  Laterality: Right;   LEFT HEART CATH AND CORONARY ANGIOGRAPHY N/A 09/02/2016   Procedure: Left Heart Cath and Coronary Angiography;  Surgeon:  Rinaldo Cloud, MD;  Location: [facility] INVASIVE CV LAB;  Service: Cardiovascular;  Laterality: N/A;   RADIOLOGY WITH ANESTHESIA N/A 08/11/2022   Procedure: IR WITH ANESTHESIA;  Surgeon: Julieanne Cotton, MD;  Location: [facility] OR;  Service: Radiology;  Laterality: N/A;   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/06/2014   Procedure: PLACEMENT OF CEMENT SPACER;  Surgeon: Loreta Ave, MD;  Location: [facility] OR;  Service: Orthopedics;  Laterality: Right;   TOTAL KNEE ARTHROPLASTY Bilateral 2011-2012   left-right   TOTAL KNEE REVISION Right 11/06/2014   Procedure: REMOVAL OF TOTAL COMPONENTS, EXTENSIVE  IRRIGATION AND DEBRIDEMENT  RIGHT KNEE;  Surgeon: Loreta Ave, MD;  Location: [facility] OR;  Service: Orthopedics;  Laterality: Right;   TOTAL KNEE REVISION Right 07/02/2015   TOTAL KNEE REVISION Right 07/02/2015   Procedure: TOTAL KNEE REVISION;  Surgeon: Loreta Ave, MD;  Location: [facility] OR;  Service: Orthopedics;  Laterality: Right;   TUBAL LIGATION  ~ 1980   tummy tuck  1980s   Patient Active Problem List   Diagnosis Date Noted   Hematoma of groin 08/12/2022   Endotracheal tube present 08/12/2022   AKI (acute kidney injury) (HCC) 08/12/2022   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/11/2022   Stroke (HCC) 10/06/2017   Acute CVA (cerebrovascular accident) (HCC) 10/06/2017  Trigger thumb, right thumb 05/03/2016   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  Rationale for Evaluation and Treatment: Rehabilitation  SUBJECTIVE:   SUBJECTIVE  STATEMENT: "I must have read the time wrong" Pt accompanied by: self  PERTINENT HISTORY: "[name] is a 72 y.o. female with history of arthritis, right basal ganglia stroke, diabetes, hypertension, hyperlipidemia, obesity and sleep apnea presented on 6/26 with acute onset aphasia with right visual field cut. She presented outside the window for TNK. She was found to have a left M3 occlusion with 23 mm perfusion deficit, no core and was taken to IR for mechanical thrombectomy. This procedure was successful with TICI 2c flow achieved. "  PAIN: Are you having pain? No  FALLS: Has patient fallen in last 6 months?  No  LIVING ENVIRONMENT: Lives with: lives with their daughter Lives in: House/apartment  PLOF:  Level of assistance: Independent with ADLs, Independent with IADLs Employment: Retired  PATIENT GOALS: "Get my words back."  OBJECTIVE:   TODAY'S TREATMENT:                                                                                                                                          09-22-22: Missed earlier ST appointment but able to reschedule for later slot today. Did not complete HEP with reported plan to complete over weekend. Re-iterated importance of daily practice to maximize recovery window. Pt verbalized understanding. Pt benefited from cued writing x2/3 opportunities to aid anomia; however, errored spelling exhibited. Reported baseline spelling difficulty. Trialed divergent naming in personally relevant categories, with self-correction of written errors x2 (skirt vs. shirt) with extra time. Targeted awareness of errors in conversational speech, with pt able to correct ~80% of errors independently. Other errors required mod A to ID and/or correct with verbal and/or written cues to break down syllables.   09-21-22: Pt endorsed some difficulty maintaining slower rate in conversation with intermittent frustration reported and exhibited when speech errors occurred.  Re-iterated importance of intentional slow rate and reducing tension to optimize performance, with usual cues required to carryover in today's session. Targeted apraxia strategies at word level, including intentional slow rate and syllable segmentation. Pt was 84% accurate with occasional self-corrections. Intermittent mod A required to break down syllables and correct errors. In subsequent conversation, pt presented with rare errors with inconsistent awareness. When aware, pt often able to self-correct with use of cued writing to  aid anomia and correct apraxic errors.   09-14-22: Patient demonstrates a fast rate of speech, which decreases intelligibility and articulatory precision. States that the longer she speaks the more trouble she will have finding the words, which was evidenced today. Education provided on relaxation techniques and use of slow rate  in order to increase fluency. Intermittent semantic and phonemic paraphasias exhibited. Pt required occasional mod-A of semantic/phonemic cues as well as sentence completion to aid anomia and correct paraphasias. HEP given to read passages aloud to practice speech and to be intentional about slowing speech.    PATIENT EDUCATION: Education details: See above Person educated: Patient Education method: Explanation, Demonstration, and Verbal cues Education comprehension: verbalized understanding and needs further education   GOALS: Goals reviewed with patient? Yes  SHORT TERM GOALS: Target date: 10/13/2022  Pt will report daily HEP completion over 1 week period  Baseline: Goal status: IN PROGRESS  2.  Pt will ID and attempt to self correct paraphasias in 3/5 opportunities in a structured task across 2 sessions given mod-A Baseline:  Goal status: IN PROGRESS  3.  Pt will use verbal compensations 4/5 word finding episodes during structured tasks with occasional min A  Baseline:  Goal status: IN PROGRESS  4.  Pt will successfully carryover  trained techniques to effectively communicate in 2 5-minute conversations given mod-A. Baseline:  Goal status: IN PROGRESS  LONG TERM GOALS: Target date: 11/10/2022  Pt will ID and self correct paraphasias in 80% of opportunities during a 10+ minute structured conversation given occasional min-A. Baseline:  Goal status: IN PROGRESS  2.  Pt will utilize word retrieval strategies to optimize verbal expression during a 10+ minute structured conversation given occasional min-A. Baseline:  Goal status: IN PROGRESS  3.  Pt will be 100% intelligible in conversation utilizing trained strategies during a 10+ minute structured conversation given occasional min-A. Baseline:  Goal status: IN PROGRESS  4.  Pt will report improved communication effectiveness by 2 points via PROM by d/c Baseline: CPIB=16 Goal status: IN PROGRESS   ASSESSMENT:  CLINICAL IMPRESSION: Patient is a 72 y.o. F who was seen today for aphasia s/p CVA in June 2024. Pt c/o frequent word finding episodes and mumbling in conversation resulting in family members requesting repetition and clarification. 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 Pt's expressive language is c/b increased speech errors (semantic & phonemic paraphasias and reduced articulatory precision) as conversation length and rate of speech increases. Given functional impact of ability to effectively and confidently communicate, pt would benefit from skilled ST to address aforementioned deficits to enhance communication efficacy.    OBJECTIVE IMPAIRMENTS: include aphasia, apraxia, and dysarthria. These impairments are limiting patient from effectively communicating at home and in community.Factors affecting potential to achieve goals and functional outcome are ability to learn/carryover information, cooperation/participation level, previous level of function, and family/community support. Patient will benefit from skilled SLP services to  address above impairments and improve overall function.  REHAB POTENTIAL: Good  PLAN:  SLP FREQUENCY: 2x/week  SLP DURATION: 8 weeks  PLANNED INTERVENTIONS: Language facilitation, Cueing hierachy, Oral motor exercises, Functional tasks, SLP instruction and feedback, Compensatory strategies, and Patient/family education    Gracy Racer, CCC-SLP 09/22/2022, 2:11 PM

## 2022-09-22 NOTE — Patient Instructions (Signed)
Speaking slowly so you can identify any errors. Don't just avoid them or say "forget it." Stop and try to say the errored word again   Practice saying these words:  Paprika (pap - reka)  Lemon pepper seasoning  Sazon (say - zon) Temperature (temp - uh - chur) Layer  Shorten/ing  Butter/milk Stir fry

## 2022-09-23 ENCOUNTER — Ambulatory Visit: Payer: 59 | Admitting: Family Medicine

## 2022-09-27 ENCOUNTER — Ambulatory Visit: Payer: 59

## 2022-09-27 NOTE — Therapy (Deleted)
OUTPATIENT SPEECH LANGUAGE PATHOLOGY APHASIA TREATMENT   Patient Name: [name] [name] MRN: [id number] DOB:1950-12-09, 72 y.o., female Today's Date: 09/27/2022  PCP: Truett Perna, MD REFERRING PROVIDER: Micki Riley, MD  END OF SESSION:     Past Medical History:  Diagnosis Date   Arthritis    "right knee" (07/02/2015)   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   Diabetes (HCC)    type II   Hyperlipidemia    Hypertension    Obesity    OSA (obstructive sleep apnea)    Overactive bladder    S/P right knee arthroscopy    with septic joint growing out MSSA   Septic arthritis of knee, right (HCC)    Wears glasses    Past Surgical History:  Procedure Laterality Date   BIOPSY  08/05/2020   Procedure: BIOPSY;  Surgeon: Kathi Der, MD;  Location: [facility];  Service: Gastroenterology;;   COLONOSCOPY     COLONOSCOPY WITH PROPOFOL N/A 08/05/2020   Procedure: COLONOSCOPY WITH PROPOFOL;  Surgeon: Kathi Der, MD;  Location: [facility];  Service: Gastroenterology;  Laterality: N/A;   DIAGNOSTIC LAPAROSCOPY     IR CT HEAD LTD  08/12/2022   IR PERCUTANEOUS ART THROMBECTOMY/INFUSION INTRACRANIAL INC DIAG ANGIO  08/12/2022   IR US GUIDE VASC ACCESS RIGHT  08/12/2022   IR US GUIDE VASC ACCESS RIGHT  08/12/2022   JOINT REPLACEMENT     KNEE ARTHROSCOPY Right 12/09/2012   Procedure: ARTHROSCOPIC IRRIGATION AND DEBRIDEMENT RIGHT KNEE;  Surgeon: Budd Palmer, MD;  Location: [facility] OR;  Service: Orthopedics;  Laterality: Right;   LEFT HEART CATH AND CORONARY ANGIOGRAPHY N/A 09/02/2016   Procedure: Left Heart Cath and Coronary Angiography;  Surgeon: Rinaldo Cloud, MD;  Location: [facility] INVASIVE CV LAB;  Service: Cardiovascular;  Laterality: N/A;   RADIOLOGY WITH ANESTHESIA N/A 08/11/2022   Procedure: IR WITH ANESTHESIA;  Surgeon: Julieanne Cotton, MD;  Location: [facility] OR;  Service: Radiology;  Laterality: N/A;   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/06/2014    Procedure: PLACEMENT OF CEMENT SPACER;  Surgeon: Loreta Ave, MD;  Location[facility] OR;  Service: Orthopedics;  Laterality: Right;   TOTAL KNEE ARTHROPLASTY Bilateral 2011-2012   left-right   TOTAL KNEE REVISION Right 11/06/2014   Procedure: REMOVAL OF TOTAL COMPONENTS, EXTENSIVE  IRRIGATION AND DEBRIDEMENT  RIGHT KNEE;  Surgeon: Loreta Ave, MD;  Location: [facility] OR;  Service: Orthopedics;  Laterality: Right;   TOTAL KNEE REVISION Right 07/02/2015   TOTAL KNEE REVISION Right 07/02/2015   Procedure: TOTAL KNEE REVISION;  Surgeon: Loreta Ave, MD;  Location: [facility] OR;  Service: Orthopedics;  Laterality: Right;   TUBAL LIGATION  ~ 1980   tummy tuck  1980s   Patient Active Problem List   Diagnosis Date Noted   Hematoma of groin 08/12/2022   Endotracheal tube present 08/12/2022   AKI (acute kidney injury) (HCC) 08/12/2022   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/11/2022   Stroke (HCC) 10/06/2017   Acute CVA (cerebrovascular accident) (HCC) 10/06/2017   Trigger thumb, right thumb 05/03/2016   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  Rationale for Evaluation and Treatment: Rehabilitation  SUBJECTIVE:   SUBJECTIVE STATEMENT: "*** Pt accompanied by: self  PERTINENT HISTORY: "[name] [name] is a 72 y.o. female with history of arthritis, right basal ganglia stroke, diabetes, hypertension, hyperlipidemia, obesity and sleep apnea presented on 6/26 with acute onset aphasia with right visual field cut. She presented outside the window  for TNK. She was found to have a left M3 occlusion with 23 mm perfusion deficit, no core and was taken to IR for mechanical thrombectomy. This procedure was successful with TICI 2c flow achieved. "  PAIN: Are you having pain? No  FALLS: Has patient fallen in last 6 months?  No  LIVING ENVIRONMENT: Lives with: lives with their daughter Lives in: House/apartment  PLOF:  Level of assistance: Independent with ADLs, Independent with IADLs Employment: Retired  PATIENT GOALS: "Get my words back."  OBJECTIVE:   TODAY'S TREATMENT:                                                                                                                                          09-27-22:  09-22-22: Missed earlier ST appointment but able to reschedule for later slot today. Did not complete HEP with reported plan to complete over weekend. Re-iterated importance of daily practice to maximize recovery window. Pt verbalized understanding. Pt benefited from cued writing x2/3 opportunities to aid anomia; however, errored spelling exhibited. Reported baseline spelling difficulty. Trialed divergent naming in personally relevant categories, with self-correction of written errors x2 (skirt vs. shirt) with extra time. Targeted awareness of errors in conversational speech, with pt able to correct ~80% of errors independently. Other errors required mod A to ID and/or correct with verbal and/or written cues to break down syllables.   09-21-22: Pt endorsed some difficulty maintaining slower rate in conversation with intermittent frustration reported and exhibited when speech errors occurred. Re-iterated importance of intentional slow rate and reducing tension to optimize performance, with usual cues required to carryover in today's session. Targeted apraxia strategies at word level, including intentional slow rate and syllable segmentation. Pt was 84% accurate with occasional self-corrections. Intermittent mod A required to break down  syllables and correct errors. In subsequent conversation, pt presented with rare errors with inconsistent awareness. When aware, pt often able to self-correct with use of cued writing to aid anomia and correct apraxic errors.   09-14-22: Patient demonstrates a fast rate of speech, which decreases intelligibility and articulatory precision. States that the longer she speaks the more trouble she will have finding the words, which was evidenced today. Education provided on relaxation techniques and use of slow rate  in order to increase fluency. Intermittent semantic and phonemic paraphasias exhibited. Pt required occasional mod-A of semantic/phonemic cues as well as sentence completion to aid anomia and correct paraphasias. HEP  given to read passages aloud to practice speech and to be intentional about slowing speech.    PATIENT EDUCATION: Education details: See above Person educated: Patient Education method: Explanation, Demonstration, and Verbal cues Education comprehension: verbalized understanding and needs further education   GOALS: Goals reviewed with patient? Yes  SHORT TERM GOALS: Target date: 10/13/2022  Pt will report daily HEP completion over 1 week period  Baseline: Goal status: IN PROGRESS  2.  Pt will ID and attempt to self correct paraphasias in 3/5 opportunities in a structured task across 2 sessions given mod-A Baseline:  Goal status: IN PROGRESS  3.  Pt will use verbal compensations 4/5 word finding episodes during structured tasks with occasional min A  Baseline:  Goal status: IN PROGRESS  4.  Pt will successfully carryover trained techniques to effectively communicate in 2 5-minute conversations given mod-A. Baseline:  Goal status: IN PROGRESS  LONG TERM GOALS: Target date: 11/10/2022  Pt will ID and self correct paraphasias in 80% of opportunities during a 10+ minute structured conversation given occasional min-A. Baseline:  Goal status: IN PROGRESS  2.  Pt  will utilize word retrieval strategies to optimize verbal expression during a 10+ minute structured conversation given occasional min-A. Baseline:  Goal status: IN PROGRESS  3.  Pt will be 100% intelligible in conversation utilizing trained strategies during a 10+ minute structured conversation given occasional min-A. Baseline:  Goal status: IN PROGRESS  4.  Pt will report improved communication effectiveness by 2 points via PROM by d/c Baseline: CPIB=16 Goal status: IN PROGRESS   ASSESSMENT:  CLINICAL IMPRESSION: Patient is a 72 y.o. F who was seen today for aphasia s/p CVA in June 2024. Pt c/o frequent word finding episodes and mumbling in conversation resulting in family members requesting repetition and clarification. 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 Pt's expressive language is c/b increased speech errors (semantic & phonemic paraphasias and reduced articulatory precision) as conversation length and rate of speech increases. Given functional impact of ability to effectively and confidently communicate, pt would benefit from skilled ST to address aforementioned deficits to enhance communication efficacy.    OBJECTIVE IMPAIRMENTS: include aphasia, apraxia, and dysarthria. These impairments are limiting patient from effectively communicating at home and in community.Factors affecting potential to achieve goals and functional outcome are ability to learn/carryover information, cooperation/participation level, previous level of function, and family/community support. Patient will benefit from skilled SLP services to address above impairments and improve overall function.  REHAB POTENTIAL: Good  PLAN:  SLP FREQUENCY: 2x/week  SLP DURATION: 8 weeks  PLANNED INTERVENTIONS: Language facilitation, Cueing hierachy, Oral motor exercises, Functional tasks, SLP instruction and feedback, Compensatory strategies, and Patient/family education    Gracy Racer, CCC-SLP 09/27/2022, 7:57 AM

## 2022-09-27 NOTE — Therapy (Deleted)
OUTPATIENT SPEECH LANGUAGE PATHOLOGY APHASIA TREATMENT   Patient Name: [name] [name] MRN: [id number] DOB:1950/10/15, 72 y.o., female Today's Date: 09/27/2022  PCP: Truett Perna, MD REFERRING PROVIDER: Micki Riley, MD  END OF SESSION:     Past Medical History:  Diagnosis Date   Arthritis    "right knee" (07/02/2015)   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   Diabetes (HCC)    type II   Hyperlipidemia    Hypertension    Obesity    OSA (obstructive sleep apnea)    Overactive bladder    S/P right knee arthroscopy    with septic joint growing out MSSA   Septic arthritis of knee, right (HCC)    Wears glasses    Past Surgical History:  Procedure Laterality Date   BIOPSY  08/05/2020   Procedure: BIOPSY;  Surgeon: Kathi Der, MD;  Location: [facility];  Service: Gastroenterology;;   COLONOSCOPY     COLONOSCOPY WITH PROPOFOL N/A 08/05/2020   Procedure: COLONOSCOPY WITH PROPOFOL;  Surgeon: Kathi Der, MD;  Location: [facility];  Service: Gastroenterology;  Laterality: N/A;   DIAGNOSTIC LAPAROSCOPY     IR CT HEAD LTD  08/12/2022   IR PERCUTANEOUS ART THROMBECTOMY/INFUSION INTRACRANIAL INC DIAG ANGIO  08/12/2022   IR US GUIDE VASC ACCESS RIGHT  08/12/2022   IR US GUIDE VASC ACCESS RIGHT  08/12/2022   JOINT REPLACEMENT     KNEE ARTHROSCOPY Right 12/09/2012   Procedure: ARTHROSCOPIC IRRIGATION AND DEBRIDEMENT RIGHT KNEE;  Surgeon: Budd Palmer, MD;  Location: [facility] OR;  Service: Orthopedics;  Laterality: Right;   LEFT HEART CATH AND CORONARY ANGIOGRAPHY N/A 09/02/2016   Procedure: Left Heart Cath and Coronary Angiography;  Surgeon: Rinaldo Cloud, MD;  Location: [facility] INVASIVE CV LAB;  Service: Cardiovascular;  Laterality: N/A;   RADIOLOGY WITH ANESTHESIA N/A 08/11/2022   Procedure: IR WITH ANESTHESIA;  Surgeon: Julieanne Cotton, MD;  Location: [facility] OR;  Service: Radiology;  Laterality: N/A;   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/06/2014    Procedure: PLACEMENT OF CEMENT SPACER;  Surgeon: Loreta Ave, MD;  Location: [facility] OR;  Service: Orthopedics;  Laterality: Right;   TOTAL KNEE ARTHROPLASTY Bilateral 2011-2012   left-right   TOTAL KNEE REVISION Right 11/06/2014   Procedure: REMOVAL OF TOTAL COMPONENTS, EXTENSIVE  IRRIGATION AND DEBRIDEMENT  RIGHT KNEE;  Surgeon: Loreta Ave, MD;  Location: [facility] OR;  Service: Orthopedics;  Laterality: Right;   TOTAL KNEE REVISION Right 07/02/2015   TOTAL KNEE REVISION Right 07/02/2015   Procedure: TOTAL KNEE REVISION;  Surgeon: Loreta Ave, MD;  Location: [facility] OR;  Service: Orthopedics;  Laterality: Right;   TUBAL LIGATION  ~ 1980   tummy tuck  1980s   Patient Active Problem List   Diagnosis Date Noted   Hematoma of groin 08/12/2022   Endotracheal tube present 08/12/2022   AKI (acute kidney injury) (HCC) 08/12/2022   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/11/2022   Stroke (HCC) 10/06/2017   Acute CVA (cerebrovascular accident) (HCC) 10/06/2017   Trigger thumb, right thumb 05/03/2016   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  Rationale for Evaluation and Treatment: Rehabilitation  SUBJECTIVE:   SUBJECTIVE STATEMENT: "*** Pt accompanied by: self  PERTINENT HISTORY: "[name] [name] is a 72 y.o. female with history of arthritis, right basal ganglia stroke, diabetes, hypertension, hyperlipidemia, obesity and sleep apnea presented on 6/26 with acute onset aphasia with right visual field cut. She presented outside the window  for TNK. She was found to have a left M3 occlusion with 23 mm perfusion deficit, no core and was taken to IR for mechanical thrombectomy. This procedure was successful with TICI 2c flow achieved. "  PAIN: Are you having pain? No  FALLS: Has patient fallen in last 6 months?  No  LIVING ENVIRONMENT: Lives with: lives with their daughter Lives in: House/apartment  PLOF:  Level of assistance: Independent with ADLs, Independent with IADLs Employment: Retired  PATIENT GOALS: "Get my words back."  OBJECTIVE:   TODAY'S TREATMENT:                                                                                                                                          09-28-22:  09-22-22: Missed earlier ST appointment but able to reschedule for later slot today. Did not complete HEP with reported plan to complete over weekend. Re-iterated importance of daily practice to maximize recovery window. Pt verbalized understanding. Pt benefited from cued writing x2/3 opportunities to aid anomia; however, errored spelling exhibited. Reported baseline spelling difficulty. Trialed divergent naming in personally relevant categories, with self-correction of written errors x2 (skirt vs. shirt) with extra time. Targeted awareness of errors in conversational speech, with pt able to correct ~80% of errors independently. Other errors required mod A to ID and/or correct with verbal and/or written cues to break down syllables.   09-21-22: Pt endorsed some difficulty maintaining slower rate in conversation with intermittent frustration reported and exhibited when speech errors occurred. Re-iterated importance of intentional slow rate and reducing tension to optimize performance, with usual cues required to carryover in today's session. Targeted apraxia strategies at word level, including intentional slow rate and syllable segmentation. Pt was 84% accurate with occasional self-corrections. Intermittent mod A required to break down  syllables and correct errors. In subsequent conversation, pt presented with rare errors with inconsistent awareness. When aware, pt often able to self-correct with use of cued writing to aid anomia and correct apraxic errors.   09-14-22: Patient demonstrates a fast rate of speech, which decreases intelligibility and articulatory precision. States that the longer she speaks the more trouble she will have finding the words, which was evidenced today. Education provided on relaxation techniques and use of slow rate  in order to increase fluency. Intermittent semantic and phonemic paraphasias exhibited. Pt required occasional mod-A of semantic/phonemic cues as well as sentence completion to aid anomia and correct paraphasias. HEP  given to read passages aloud to practice speech and to be intentional about slowing speech.    PATIENT EDUCATION: Education details: See above Person educated: Patient Education method: Explanation, Demonstration, and Verbal cues Education comprehension: verbalized understanding and needs further education   GOALS: Goals reviewed with patient? Yes  SHORT TERM GOALS: Target date: 10/13/2022  Pt will report daily HEP completion over 1 week period  Baseline: Goal status: IN PROGRESS  2.  Pt will ID and attempt to self correct paraphasias in 3/5 opportunities in a structured task across 2 sessions given mod-A Baseline:  Goal status: IN PROGRESS  3.  Pt will use verbal compensations 4/5 word finding episodes during structured tasks with occasional min A  Baseline:  Goal status: IN PROGRESS  4.  Pt will successfully carryover trained techniques to effectively communicate in 2 5-minute conversations given mod-A. Baseline:  Goal status: IN PROGRESS  LONG TERM GOALS: Target date: 11/10/2022  Pt will ID and self correct paraphasias in 80% of opportunities during a 10+ minute structured conversation given occasional min-A. Baseline:  Goal status: IN PROGRESS  2.  Pt  will utilize word retrieval strategies to optimize verbal expression during a 10+ minute structured conversation given occasional min-A. Baseline:  Goal status: IN PROGRESS  3.  Pt will be 100% intelligible in conversation utilizing trained strategies during a 10+ minute structured conversation given occasional min-A. Baseline:  Goal status: IN PROGRESS  4.  Pt will report improved communication effectiveness by 2 points via PROM by d/c Baseline: CPIB=16 Goal status: IN PROGRESS   ASSESSMENT:  CLINICAL IMPRESSION: Patient is a 72 y.o. F who was seen today for aphasia s/p CVA in June 2024. Pt c/o frequent word finding episodes and mumbling in conversation resulting in family members requesting repetition and clarification. 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 Pt's expressive language is c/b increased speech errors (semantic & phonemic paraphasias and reduced articulatory precision) as conversation length and rate of speech increases. Given functional impact of ability to effectively and confidently communicate, pt would benefit from skilled ST to address aforementioned deficits to enhance communication efficacy.    OBJECTIVE IMPAIRMENTS: include aphasia, apraxia, and dysarthria. These impairments are limiting patient from effectively communicating at home and in community.Factors affecting potential to achieve goals and functional outcome are ability to learn/carryover information, cooperation/participation level, previous level of function, and family/community support. Patient will benefit from skilled SLP services to address above impairments and improve overall function.  REHAB POTENTIAL: Good  PLAN:  SLP FREQUENCY: 2x/week  SLP DURATION: 8 weeks  PLANNED INTERVENTIONS: Language facilitation, Cueing hierachy, Oral motor exercises, Functional tasks, SLP instruction and feedback, Compensatory strategies, and Patient/family education    Gracy Racer, CCC-SLP 09/27/2022, 11:10 AM

## 2022-09-28 ENCOUNTER — Ambulatory Visit: Payer: 59

## 2022-09-29 ENCOUNTER — Ambulatory Visit: Payer: 59

## 2022-10-04 ENCOUNTER — Ambulatory Visit: Payer: 59

## 2022-10-04 ENCOUNTER — Ambulatory Visit: Payer: 59 | Admitting: Neurology

## 2022-10-13 ENCOUNTER — Other Ambulatory Visit: Payer: Self-pay | Admitting: Podiatry

## 2022-10-13 ENCOUNTER — Ambulatory Visit (INDEPENDENT_AMBULATORY_CARE_PROVIDER_SITE_OTHER): Payer: 59

## 2022-10-13 ENCOUNTER — Encounter: Payer: Self-pay | Admitting: Podiatry

## 2022-10-13 ENCOUNTER — Ambulatory Visit (INDEPENDENT_AMBULATORY_CARE_PROVIDER_SITE_OTHER): Payer: 59 | Admitting: Podiatry

## 2022-10-13 DIAGNOSIS — M79671 Pain in right foot: Secondary | ICD-10-CM

## 2022-10-13 DIAGNOSIS — R600 Localized edema: Secondary | ICD-10-CM | POA: Diagnosis not present

## 2022-10-13 DIAGNOSIS — M779 Enthesopathy, unspecified: Secondary | ICD-10-CM | POA: Diagnosis not present

## 2022-10-13 MED ORDER — TRIAMCINOLONE ACETONIDE 10 MG/ML IJ SUSP
10.0000 mg | Freq: Once | INTRAMUSCULAR | Status: AC
Start: 2022-10-13 — End: 2022-10-13
  Administered 2022-10-13: 10 mg via INTRA_ARTICULAR

## 2022-10-13 NOTE — Progress Notes (Signed)
Subjective:   Patient ID: [name] [name], female   DOB: 72 y.o.   MRN: [id number]   HPI Patient presents stating she gets a lot of pain in her right foot and it is more on the top she has a lot of swelling in her feet and has had swelling in her legs it has been present for over 10 years with probable lymphedema.  Patient does not currently smoke likes to be active if possible   Review of Systems  All other systems reviewed and are negative.       Objective:  Physical Exam Vitals and nursing note reviewed.  Constitutional:      Appearance: She is well-developed.  Pulmonary:     Effort: Pulmonary effort is normal.  Musculoskeletal:        General: Normal range of motion.  Skin:    General: Skin is warm.  Neurological:     Mental Status: She is alert.     Neurovascular status was found to be intact with severe edema of the legs bilateral which appears to be more lymph in his origin with flatfoot deformity and inflammation of the dorsal tendon group.  Good digital perfusion well-oriented     Assessment:  H&P reviewed discussed the difficulty of this condition and the lymphedema present along with the inflammatory tendinitis     Plan:  H&P reviewed I am going to focus on the top and I did go ahead today and I did sterile prep and injected the dorsal tendon complex 3 mg Kenalog 5 mg Xylocaine advised on ice therapy anti-inflammatories and elevation.  Reappoint as symptoms indicate  X-rays indicate moderate collapse of the arch with dorsal spur formation noted

## 2022-11-24 ENCOUNTER — Other Ambulatory Visit: Payer: Self-pay

## 2022-11-24 NOTE — Patient Outreach (Signed)
Telephone outreach to patient to obtain mRS was successfully completed. MRS= 0  Ritta Slot [facility] health Care Management Assistant [phone]

## 2023-03-23 NOTE — Therapy (Signed)
 [facility] [address] [place], [place], [address] Phone: [phone]   Fax:  [phone]  Patient Details  Name: [name] [name] MRN: [id number] Date of Birth: [birth year] Referring Provider:  No ref. provider found  Encounter Date: 03/23/2023  SPEECH THERAPY DISCHARGE SUMMARY  Visits from Start of Care: 3  Current functional level related to goals / functional outcomes: Unknown current level as pt did not return after last visit.    Remaining deficits: Unknown   Education / Equipment: Speech strategies   Patient agrees to discharge. Patient goals were not met. Patient is being discharged due to not returning since the last visit.SABRA Comer LILLETTE Vicci, CCC-SLP 03/23/2023, 9:25 AM  [place] [facility] [address] [place], [place], [address] Phone: [phone]   Fax:  [phone]

## 2023-04-11 ENCOUNTER — Telehealth: Payer: Self-pay | Admitting: Podiatry

## 2023-04-11 NOTE — Telephone Encounter (Signed)
 Pt states they sense tingling/numbness in R foot. It began at night but now feels it more frequently throughout the day. Pt would like to know if there are any at-home remedies that can help her in the meantime before her appt on 04/18/2023.

## 2023-04-14 ENCOUNTER — Ambulatory Visit (HOSPITAL_BASED_OUTPATIENT_CLINIC_OR_DEPARTMENT_OTHER)
Admission: RE | Admit: 2023-04-14 | Discharge: 2023-04-14 | Disposition: A | Discharge status: Home / Self Care | Payer: 59 | Source: Ambulatory Visit | Attending: Sports Medicine | Admitting: Sports Medicine

## 2023-04-14 ENCOUNTER — Ambulatory Visit (INDEPENDENT_AMBULATORY_CARE_PROVIDER_SITE_OTHER): Payer: 59 | Admitting: Sports Medicine

## 2023-04-14 ENCOUNTER — Encounter: Payer: Self-pay | Admitting: Sports Medicine

## 2023-04-14 VITALS — BP 140/80 | Ht 64.0 in | Wt 292.0 lb

## 2023-04-14 DIAGNOSIS — M25511 Pain in right shoulder: Secondary | ICD-10-CM | POA: Diagnosis not present

## 2023-04-14 DIAGNOSIS — G8929 Other chronic pain: Secondary | ICD-10-CM | POA: Insufficient documentation

## 2023-04-14 DIAGNOSIS — M25561 Pain in right knee: Secondary | ICD-10-CM | POA: Diagnosis present

## 2023-04-14 DIAGNOSIS — M5416 Radiculopathy, lumbar region: Secondary | ICD-10-CM

## 2023-04-14 MED ORDER — GABAPENTIN 300 MG PO CAPS: 300.0000 mg | ORAL_CAPSULE | Freq: Three times a day (TID) | ORAL | 0 refills | Status: DC

## 2023-04-14 NOTE — Progress Notes (Signed)
   Subjective:    Patient ID: [name] [name], female    DOB: June 10, 1950, 73 y.o.   MRN: [id number]  HPI chief complaint: Right shoulder and right leg pain  [place] presents today with a couple of different complaints.  She is complaining of intermittent right shoulder pain that became worse on Sunday.  She describes it as a throbbing type pain that will radiate into the proximal upper arm.  No trauma.  Pain will occasionally radiate into the neck as well.  Pain is worse with shoulder motion. She is also complaining of intermittent right leg numbness and tingling which has been present since this past Saturday.  She was simply sitting on her couch when the symptoms began.  Symptoms are localized to the lower leg and foot.  She has a well-documented history of chronic low back issues including previous spinal surgery.  Past medical history reviewed Medications reviewed Allergies reviewed  Review of Systems As above    Objective:   Physical Exam  She is sitting comfortably in the exam room  Right shoulder: Full shoulder range of motion.  Positive signs of impingement including positive empty can and positive Hawkins.  Good rotator cuff strength.  No tenderness to palpation.  Examination of the right lower leg was limited somewhat by body habitus.  She does have some pain and swelling in the right knee.  No atrophy.  No focal strength deficit appreciated.  Sensation intact to light touch.      Assessment & Plan:   Right shoulder pain likely secondary to rotator cuff tendinopathy versus subacromial bursitis Right lower extremity numbness and tingling likely originating in the lumbar spine Status post remote right total knee replacement  For the right shoulder we discussed subacromial cortisone injection but she would like to try something different.  I recommend a trial of Voltaren gel twice daily to the shoulder for the next several days.  Her numbness and tingling in the right lower leg  are bothersome enough that she would like to try something for that as well.  So we will trial Neurontin titrating up to 300 mg 3 times a day.  I will also get an x-ray of her right knee.  I will call her early next week to discuss those results and see how she is doing with her Neurontin and Voltaren gel.  This note was dictated using Dragon naturally speaking software and may contain errors in syntax, spelling, or content which have not been identified prior to signing this note.

## 2023-04-14 NOTE — Patient Instructions (Signed)
 Gapapentin 300 mg Take 1 by mouth every night for 2 nights, then take 1 twice daily for 2 days, then take 1 each three times daily until finished.

## 2023-04-18 ENCOUNTER — Ambulatory Visit (INDEPENDENT_AMBULATORY_CARE_PROVIDER_SITE_OTHER): Payer: 59 | Admitting: Podiatry

## 2023-04-18 ENCOUNTER — Encounter: Payer: Self-pay | Admitting: Podiatry

## 2023-04-18 DIAGNOSIS — M7751 Other enthesopathy of right foot: Secondary | ICD-10-CM

## 2023-04-18 DIAGNOSIS — Q828 Other specified congenital malformations of skin: Secondary | ICD-10-CM | POA: Diagnosis not present

## 2023-04-18 DIAGNOSIS — D689 Coagulation defect, unspecified: Secondary | ICD-10-CM | POA: Diagnosis not present

## 2023-04-18 MED ORDER — TRIAMCINOLONE ACETONIDE 10 MG/ML IJ SUSP
10.0000 mg | Freq: Once | INTRAMUSCULAR | Status: AC
  Administered 2023-04-18: 10 mg via INTRA_ARTICULAR

## 2023-04-19 NOTE — Progress Notes (Signed)
 Subjective:   Patient ID: [name] [name], female   DOB: 73 y.o.   MRN: [id number]   HPI Patient presents stating that she has inflammation fluid around the fifth metatarsal head right and has several nucleated lesion plantar aspect right foot that are painful when pressed make it hard to walk and does take blood thinner   ROS      Objective:  Physical Exam  Neurovascular status was found to be intact with inflammation fluid around the fifth MPJ that are painful when pressed with chronic lesion x 3 subthird fifth metatarsal with lucent cores upon debridement     Assessment:  Inflammatory capsulitis fifth MPJ right with 3 separate lesions painful right with patient on blood thinner and cannot take care of herself with pain     Plan:  H&P reviewed sterile sharp debridement 3 separate lesions plantar aspect right no iatrogenic bleeding and for the plantar capsule with fluid buildup I injected 3 mg Dexasone Kenalog 5 mg Xylocaine applied sterile dress

## 2023-04-19 NOTE — Telephone Encounter (Signed)
  I spoke with [facility] today on the phone to see how she was doing.  The gabapentin has worked very well for her right leg sciatica.  She has been able to ambulate much easier.  I also told her that I did not see any evidence of hardware loosening in her right knee.  Radiology interpretation was pending at the time of this dictation and I will touch base with her once again once I get the radiologist report.

## 2023-04-22 DIAGNOSIS — N3281 Overactive bladder: Secondary | ICD-10-CM | POA: Diagnosis not present

## 2023-05-02 DIAGNOSIS — R0602 Shortness of breath: Secondary | ICD-10-CM | POA: Diagnosis not present

## 2023-05-09 DIAGNOSIS — I10 Essential (primary) hypertension: Secondary | ICD-10-CM | POA: Diagnosis not present

## 2023-05-09 DIAGNOSIS — R600 Localized edema: Secondary | ICD-10-CM | POA: Diagnosis not present

## 2023-05-09 DIAGNOSIS — M19011 Primary osteoarthritis, right shoulder: Secondary | ICD-10-CM | POA: Diagnosis not present

## 2023-05-09 DIAGNOSIS — M19012 Primary osteoarthritis, left shoulder: Secondary | ICD-10-CM | POA: Diagnosis not present

## 2023-05-26 DIAGNOSIS — M5136 Other intervertebral disc degeneration, lumbar region with discogenic back pain only: Secondary | ICD-10-CM | POA: Diagnosis not present

## 2023-05-26 DIAGNOSIS — E119 Type 2 diabetes mellitus without complications: Secondary | ICD-10-CM | POA: Diagnosis not present

## 2023-05-26 DIAGNOSIS — Z8673 Personal history of transient ischemic attack (TIA), and cerebral infarction without residual deficits: Secondary | ICD-10-CM | POA: Diagnosis not present

## 2023-05-27 ENCOUNTER — Telehealth: Payer: Self-pay | Admitting: *Deleted

## 2023-05-27 DIAGNOSIS — M1711 Unilateral primary osteoarthritis, right knee: Secondary | ICD-10-CM

## 2023-05-27 NOTE — Telephone Encounter (Signed)
 Patient called requesting referral to the doctor you mentioned to her for her knee pain.  Who do you want her to see for her knee pain? I will send referral.

## 2023-05-27 NOTE — Telephone Encounter (Signed)
 Referral placed for [name] [name], per Dr. Margaretha Sheffield.

## 2023-05-31 DIAGNOSIS — I890 Lymphedema, not elsewhere classified: Secondary | ICD-10-CM | POA: Diagnosis not present

## 2023-06-07 DIAGNOSIS — K130 Diseases of lips: Secondary | ICD-10-CM | POA: Diagnosis not present

## 2023-06-07 DIAGNOSIS — L218 Other seborrheic dermatitis: Secondary | ICD-10-CM | POA: Diagnosis not present

## 2023-06-14 DIAGNOSIS — N3281 Overactive bladder: Secondary | ICD-10-CM | POA: Diagnosis not present

## 2023-06-17 DIAGNOSIS — R600 Localized edema: Secondary | ICD-10-CM | POA: Diagnosis not present

## 2023-06-24 DIAGNOSIS — R600 Localized edema: Secondary | ICD-10-CM | POA: Diagnosis not present

## 2023-07-08 DIAGNOSIS — R600 Localized edema: Secondary | ICD-10-CM | POA: Diagnosis not present

## 2023-07-16 ENCOUNTER — Other Ambulatory Visit: Payer: Self-pay

## 2023-07-16 ENCOUNTER — Inpatient Hospital Stay (HOSPITAL_COMMUNITY)
Admission: EM | Admit: 2023-07-16 | Discharge: 2023-07-19 | DRG: 683 | Disposition: A | Discharge status: Home / Self Care | Attending: Student | Admitting: Student

## 2023-07-16 ENCOUNTER — Encounter (HOSPITAL_COMMUNITY): Payer: Self-pay | Admitting: Emergency Medicine

## 2023-07-16 ENCOUNTER — Emergency Department (HOSPITAL_COMMUNITY)

## 2023-07-16 DIAGNOSIS — G4733 Obstructive sleep apnea (adult) (pediatric): Secondary | ICD-10-CM | POA: Diagnosis present

## 2023-07-16 DIAGNOSIS — N390 Urinary tract infection, site not specified: Secondary | ICD-10-CM | POA: Diagnosis present

## 2023-07-16 DIAGNOSIS — I5032 Chronic diastolic (congestive) heart failure: Secondary | ICD-10-CM | POA: Diagnosis present

## 2023-07-16 DIAGNOSIS — N281 Cyst of kidney, acquired: Secondary | ICD-10-CM | POA: Diagnosis not present

## 2023-07-16 DIAGNOSIS — E1151 Type 2 diabetes mellitus with diabetic peripheral angiopathy without gangrene: Secondary | ICD-10-CM | POA: Diagnosis present

## 2023-07-16 DIAGNOSIS — E785 Hyperlipidemia, unspecified: Secondary | ICD-10-CM | POA: Diagnosis present

## 2023-07-16 DIAGNOSIS — Z888 Allergy status to other drugs, medicaments and biological substances status: Secondary | ICD-10-CM

## 2023-07-16 DIAGNOSIS — B9620 Unspecified Escherichia coli [E. coli] as the cause of diseases classified elsewhere: Secondary | ICD-10-CM | POA: Diagnosis present

## 2023-07-16 DIAGNOSIS — R531 Weakness: Secondary | ICD-10-CM

## 2023-07-16 DIAGNOSIS — R059 Cough, unspecified: Secondary | ICD-10-CM | POA: Diagnosis not present

## 2023-07-16 DIAGNOSIS — Z823 Family history of stroke: Secondary | ICD-10-CM

## 2023-07-16 DIAGNOSIS — Z1611 Resistance to penicillins: Secondary | ICD-10-CM | POA: Diagnosis present

## 2023-07-16 DIAGNOSIS — Z79899 Other long term (current) drug therapy: Secondary | ICD-10-CM | POA: Diagnosis not present

## 2023-07-16 DIAGNOSIS — Z6841 Body Mass Index (BMI) 40.0 and over, adult: Secondary | ICD-10-CM

## 2023-07-16 DIAGNOSIS — R42 Dizziness and giddiness: Secondary | ICD-10-CM | POA: Diagnosis not present

## 2023-07-16 DIAGNOSIS — Z7985 Long-term (current) use of injectable non-insulin antidiabetic drugs: Secondary | ICD-10-CM

## 2023-07-16 DIAGNOSIS — N3281 Overactive bladder: Secondary | ICD-10-CM | POA: Diagnosis present

## 2023-07-16 DIAGNOSIS — R404 Transient alteration of awareness: Secondary | ICD-10-CM | POA: Diagnosis not present

## 2023-07-16 DIAGNOSIS — Z8249 Family history of ischemic heart disease and other diseases of the circulatory system: Secondary | ICD-10-CM

## 2023-07-16 DIAGNOSIS — I69351 Hemiplegia and hemiparesis following cerebral infarction affecting right dominant side: Secondary | ICD-10-CM | POA: Diagnosis not present

## 2023-07-16 DIAGNOSIS — R0989 Other specified symptoms and signs involving the circulatory and respiratory systems: Secondary | ICD-10-CM | POA: Diagnosis not present

## 2023-07-16 DIAGNOSIS — N179 Acute kidney failure, unspecified: Secondary | ICD-10-CM | POA: Diagnosis present

## 2023-07-16 DIAGNOSIS — E860 Dehydration: Secondary | ICD-10-CM | POA: Diagnosis present

## 2023-07-16 DIAGNOSIS — Z95828 Presence of other vascular implants and grafts: Secondary | ICD-10-CM

## 2023-07-16 DIAGNOSIS — Z87891 Personal history of nicotine dependence: Secondary | ICD-10-CM | POA: Diagnosis not present

## 2023-07-16 DIAGNOSIS — I110 Hypertensive heart disease with heart failure: Secondary | ICD-10-CM | POA: Diagnosis present

## 2023-07-16 DIAGNOSIS — I160 Hypertensive urgency: Secondary | ICD-10-CM | POA: Diagnosis present

## 2023-07-16 DIAGNOSIS — Z7982 Long term (current) use of aspirin: Secondary | ICD-10-CM

## 2023-07-16 DIAGNOSIS — R55 Syncope and collapse: Secondary | ICD-10-CM | POA: Diagnosis present

## 2023-07-16 DIAGNOSIS — I959 Hypotension, unspecified: Secondary | ICD-10-CM | POA: Diagnosis present

## 2023-07-16 DIAGNOSIS — Z96653 Presence of artificial knee joint, bilateral: Secondary | ICD-10-CM | POA: Diagnosis present

## 2023-07-16 DIAGNOSIS — Z7902 Long term (current) use of antithrombotics/antiplatelets: Secondary | ICD-10-CM

## 2023-07-16 DIAGNOSIS — Z803 Family history of malignant neoplasm of breast: Secondary | ICD-10-CM

## 2023-07-16 DIAGNOSIS — I517 Cardiomegaly: Secondary | ICD-10-CM | POA: Diagnosis not present

## 2023-07-16 LAB — CBC WITH DIFFERENTIAL/PLATELET
Abs Immature Granulocytes: 0.03 10*3/uL (ref 0.00–0.07)
Basophils Absolute: 0 10*3/uL (ref 0.0–0.1)
Basophils Relative: 0 %
Eosinophils Absolute: 0 10*3/uL (ref 0.0–0.5)
Eosinophils Relative: 1 %
HCT: 46.4 % — ABNORMAL HIGH (ref 36.0–46.0)
Hemoglobin: 14.9 g/dL (ref 12.0–15.0)
Immature Granulocytes: 0 %
Lymphocytes Relative: 11 %
Lymphs Abs: 0.9 10*3/uL (ref 0.7–4.0)
MCH: 27.5 pg (ref 26.0–34.0)
MCHC: 32.1 g/dL (ref 30.0–36.0)
MCV: 85.8 fL (ref 80.0–100.0)
Monocytes Absolute: 0.7 10*3/uL (ref 0.1–1.0)
Monocytes Relative: 8 %
Neutro Abs: 6.4 10*3/uL (ref 1.7–7.7)
Neutrophils Relative %: 80 %
Platelets: 194 10*3/uL (ref 150–400)
RBC: 5.41 MIL/uL — ABNORMAL HIGH (ref 3.87–5.11)
RDW: 16.3 % — ABNORMAL HIGH (ref 11.5–15.5)
WBC: 8 10*3/uL (ref 4.0–10.5)
nRBC: 0 % (ref 0.0–0.2)

## 2023-07-16 LAB — COMPREHENSIVE METABOLIC PANEL WITH GFR
ALT: 10 U/L (ref 0–44)
AST: 16 U/L (ref 15–41)
Albumin: 3.9 g/dL (ref 3.5–5.0)
Alkaline Phosphatase: 46 U/L (ref 38–126)
Anion gap: 8 (ref 5–15)
BUN: 20 mg/dL (ref 8–23)
CO2: 24 mmol/L (ref 22–32)
Calcium: 10 mg/dL (ref 8.9–10.3)
Chloride: 107 mmol/L (ref 98–111)
Creatinine, Ser: 1.16 mg/dL — ABNORMAL HIGH (ref 0.44–1.00)
GFR, Estimated: 50 mL/min — ABNORMAL LOW (ref >60)
Glucose, Bld: 100 mg/dL — ABNORMAL HIGH (ref 70–99)
Potassium: 4.5 mmol/L (ref 3.5–5.1)
Sodium: 139 mmol/L (ref 135–145)
Total Bilirubin: 0.9 mg/dL (ref 0.0–1.2)
Total Protein: 7.2 g/dL (ref 6.5–8.1)

## 2023-07-16 LAB — URINALYSIS, W/ REFLEX TO CULTURE (INFECTION SUSPECTED)
Bilirubin Urine: NEGATIVE
Glucose, UA: NEGATIVE mg/dL
Ketones, ur: NEGATIVE mg/dL
Nitrite: POSITIVE — AB
Protein, ur: 30 mg/dL — AB
Specific Gravity, Urine: 1.012 (ref 1.005–1.030)
WBC, UA: >50 WBC/hpf (ref 0–5)
pH: 5 (ref 5.0–8.0)

## 2023-07-16 MED ORDER — SODIUM CHLORIDE 0.9 % IV SOLN
INTRAVENOUS | Status: DC
  Administered 2023-07-16: 100 mL/h via INTRAVENOUS

## 2023-07-16 MED ORDER — PROCHLORPERAZINE EDISYLATE 10 MG/2ML IJ SOLN
5.0000 mg | Freq: Four times a day (QID) | INTRAMUSCULAR | Status: DC | PRN
  Administered 2023-07-19: 5 mg via INTRAVENOUS
  Filled 2023-07-16: qty 2

## 2023-07-16 MED ORDER — POLYETHYLENE GLYCOL 3350 17 G PO PACK: 17.0000 g | PACK | Freq: Every day | ORAL | Status: DC | PRN

## 2023-07-16 MED ORDER — MELATONIN 5 MG PO TABS: 5.0000 mg | ORAL_TABLET | Freq: Every evening | ORAL | Status: DC | PRN

## 2023-07-16 MED ORDER — ACETAMINOPHEN 325 MG PO TABS
650.0000 mg | ORAL_TABLET | Freq: Four times a day (QID) | ORAL | Status: DC | PRN
  Administered 2023-07-17 – 2023-07-19 (×2): 650 mg via ORAL
  Filled 2023-07-16 (×2): qty 2

## 2023-07-16 MED ORDER — ENOXAPARIN SODIUM 40 MG/0.4ML IJ SOSY
40.0000 mg | PREFILLED_SYRINGE | INTRAMUSCULAR | Status: DC
  Administered 2023-07-16 – 2023-07-18 (×3): 40 mg via SUBCUTANEOUS
  Filled 2023-07-16 (×3): qty 0.4

## 2023-07-16 MED ORDER — SODIUM CHLORIDE 0.9 % IV SOLN
1.0000 g | INTRAVENOUS | Status: DC
  Administered 2023-07-17 – 2023-07-18 (×2): 1 g via INTRAVENOUS
  Filled 2023-07-16 (×2): qty 10

## 2023-07-16 MED ORDER — LACTATED RINGERS IV BOLUS
1000.0000 mL | Freq: Once | INTRAVENOUS | Status: AC
  Administered 2023-07-16: 1000 mL via INTRAVENOUS

## 2023-07-16 MED ORDER — SODIUM CHLORIDE 0.9 % IV SOLN
2.0000 g | Freq: Once | INTRAVENOUS | Status: AC
  Administered 2023-07-16: 2 g via INTRAVENOUS
  Filled 2023-07-16: qty 20

## 2023-07-16 NOTE — ED Provider Notes (Signed)
  Physical Exam  BP (!) 141/52   Pulse 76   Temp 97.9 F (36.6 C) (Oral)   Resp 16   Ht 5\' 4"  (1.626 m)   Wt 122.5 kg   SpO2 99%   BMI 46.35 kg/m   Physical Exam Vitals and nursing note reviewed.  HENT:     Head: Normocephalic and atraumatic.  Eyes:     Pupils: Pupils are equal, round, and reactive to light.  Cardiovascular:     Rate and Rhythm: Normal rate and regular rhythm.  Pulmonary:     Effort: Pulmonary effort is normal.     Breath sounds: Normal breath sounds.  Abdominal:     Palpations: Abdomen is soft.     Tenderness: There is no abdominal tenderness.  Skin:    General: Skin is warm and dry.  Neurological:     Mental Status: She is alert.  Psychiatric:        Mood and Affect: Mood normal.     Procedures  Procedures  ED Course / MDM   Clinical Course as of 07/16/23 2158  Sat Jul 16, 2023  2157 UA does show UTI.  Will send for culture.  Will obtain blood cultures and start on Rocephin.  Given story of syncope/near syncope, hypotension and UTI we will admit to medicine for further evaluation and treatment [MP]    Clinical Course User Index [MP] Sallyanne Creamer, DO   Medical Decision Making I, Rafael Bun DO, have assumed care of this patient from the previous provider pending urinalysis, orthostatic vital signs, reevaluation disposition  Amount and/or Complexity of Data Reviewed Labs: ordered. Radiology: ordered.          Sallyanne Creamer, DO 07/16/23 2158

## 2023-07-16 NOTE — Progress Notes (Signed)
IV obtained by ED RN.

## 2023-07-16 NOTE — ED Notes (Signed)
 ED Provider at bedside.

## 2023-07-16 NOTE — H&P (Signed)
 History and Physical  [name] [name] QIO:[id number] DOB: [birth year] DOA: 07/16/2023  Referring physician: Dr. Ranelle Buys, EDP. PCP: Carlye Child, MD  Outpatient Specialists: Neurosurgery, pulmonary, podiatry. Patient coming from: Home  Chief Complaint: Generalized weakness.  HPI: [name] [name] is a 73 y.o. female with medical history significant for severe morbid obesity, OSA intolerant of CPAP, prior CVA with residual right-sided weakness, PAD, left carotid stenosis, status post recent left carotid artery stent placement, type 2 diabetes, hypertension, chronic HFrEF, overactive bladder, who presents to the ER due to generalized weakness and shortness of breath.  Her family member helper get to the toilet after falling on the floor.  EMS was activated.  Upon EMS arrival the patient was noted to be hypotensive.  BP improved with IV fluid bolus.  No reported subjective fevers or chills.  Oral intake has been poor since starting Ozempic over a month ago.  In the ER, hypotensive with MAP of 59, responded well to an additional 1 L IV fluid bolus LR.  UA was positive for pyuria.  TRH, hospitalist service, was asked to admit.  ED Course: Temperature 98.  BP 133/76, pulse 76, respiration rate 16.  O2 saturation 95% on room air.  Lab studies notable for creatinine 1.16 with GFR 50.  Review of Systems: Review of systems as noted in the HPI. All other systems reviewed and are negative.   Past Medical History:  Diagnosis Date   Arthritis    "right knee" (07/02/2015)   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   Diabetes (HCC)    type II   Hyperlipidemia    Hypertension    Obesity    OSA (obstructive sleep apnea)    Overactive bladder    S/P right knee arthroscopy    with septic joint growing out MSSA   Septic arthritis of knee, right (HCC)    Wears glasses    Past Surgical History:  Procedure Laterality Date   BIOPSY  08/05/2020   Procedure: BIOPSY;  Surgeon: Felecia Hopper,  MD;  Location: [facility];  Service: Gastroenterology;;   COLONOSCOPY     COLONOSCOPY WITH PROPOFOL  N/A 08/05/2020   Procedure: COLONOSCOPY WITH PROPOFOL ;  Surgeon: Felecia Hopper, MD;  Location: [facility];  Service: Gastroenterology;  Laterality: N/A;   DIAGNOSTIC LAPAROSCOPY     IR CT HEAD LTD  08/12/2022   IR PERCUTANEOUS ART THROMBECTOMY/INFUSION INTRACRANIAL INC DIAG ANGIO  08/12/2022   IR US  GUIDE VASC ACCESS RIGHT  08/12/2022   IR US  GUIDE VASC ACCESS RIGHT  08/12/2022   JOINT REPLACEMENT     KNEE ARTHROSCOPY Right 12/09/2012   Procedure: ARTHROSCOPIC IRRIGATION AND DEBRIDEMENT RIGHT KNEE;  Surgeon: Arlette Lagos, MD;  Location: [facility] OR;  Service: Orthopedics;  Laterality: Right;   LEFT HEART CATH AND CORONARY ANGIOGRAPHY N/A 09/02/2016   Procedure: Left Heart Cath and Coronary Angiography;  Surgeon: Chapman Commodore, MD;  Location: [facility] INVASIVE CV LAB;  Service: Cardiovascular;  Laterality: N/A;   RADIOLOGY WITH ANESTHESIA N/A 08/11/2022   Procedure: IR WITH ANESTHESIA;  Surgeon: Luellen Sages, MD;  Location: [facility] OR;  Service: Radiology;  Laterality: N/A;   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/06/2014   Procedure: PLACEMENT OF CEMENT SPACER;  Surgeon: Ferd Householder, MD;  Location: [facility] OR;  Service: Orthopedics;  Laterality: Right;   TOTAL KNEE ARTHROPLASTY Bilateral 2011-2012   left-right   TOTAL KNEE REVISION Right 11/06/2014   Procedure: REMOVAL OF TOTAL COMPONENTS, EXTENSIVE  IRRIGATION AND DEBRIDEMENT  RIGHT KNEE;  Surgeon: Ferd Householder, MD;  Location: [facility] OR;  Service: Orthopedics;  Laterality: Right;   TOTAL KNEE REVISION Right 07/02/2015   TOTAL KNEE REVISION Right 07/02/2015   Procedure: TOTAL KNEE REVISION;  Surgeon: Ferd Householder, MD;  Location: [facility] OR;  Service: Orthopedics;  Laterality: Right;   TUBAL LIGATION  ~ 1980   tummy tuck  1980s    Social History:  reports that she quit smoking about 30 years ago. Her smoking use included cigarettes. She started  smoking about 42 years ago. She has a 6 pack-year smoking history. She has never used smokeless tobacco. She reports that she does not drink alcohol and does not use drugs.   Allergies  Allergen Reactions   Metoprolol  Tartrate Other (See Comments)    Other reaction(s): kidney pain    Family History  Problem Relation Age of Onset   Breast cancer Mother    Stroke Father    Hypertension Other       Prior to Admission medications   Medication Sig Start Date End Date Taking? Authorizing Provider  albuterol  (VENTOLIN  HFA) 108 (90 Base) MCG/ACT inhaler Inhale 2 puffs into the lungs every 4 (four) hours as needed for wheezing or shortness of breath. 07/25/20   [provider]  amLODipine  (NORVASC ) 5 MG tablet Take 1 tablet (5 mg total) by mouth daily. 08/16/22   Imogene Mana, NP  aspirin  81 MG chewable tablet Chew 1 tablet (81 mg total) by mouth daily. 08/16/22   Imogene Mana, NP  carvedilol  (COREG ) 25 MG tablet Take 1 tablet (25 mg total) by mouth 2 (two) times daily with a meal. 08/15/22   Imogene Mana, NP  clopidogrel  (PLAVIX ) 75 MG tablet Take 1 tablet (75 mg total) by mouth daily. 10/11/17   Lavaughn Portland, MD  diclofenac  (VOLTAREN ) 75 MG EC tablet Take 75 mg by mouth daily as needed for mild pain or moderate pain.    [provider]  estradiol (ESTRACE) 0.1 MG/GM vaginal cream Place 1 Applicatorful vaginally daily as needed (vaginal burning). 06/30/20   [provider]  ezetimibe (ZETIA) 10 MG tablet Take 10 mg by mouth daily. 09/10/21   [provider]  gabapentin  (NEURONTIN ) 300 MG capsule Take 1 capsule (300 mg total) by mouth 3 (three) times daily. 04/14/23   Frazier Jacob, DO  lidocaine  (XYLOCAINE ) 2 % solution Use as directed 15 mLs in the mouth or throat every 3 (three) hours as needed for mouth pain (Sore throat). 03/31/21   Eloise Hake Scales, PA-C  Lifitegrast (XIIDRA) 5 % SOLN Place 1 drop into both eyes 2 (two) times daily. 03/27/19   [provider]  minoxidil  (LONITEN ) 10 MG tablet Take 10 mg by mouth 2 (two) times daily.    [provider]  minoxidil  (LONITEN ) 10 MG tablet Take 1 tablet (10 mg total) by mouth daily. 08/16/22   Imogene Mana, NP  mupirocin cream (BACTROBAN) 2 % Apply 1 application topically daily as needed (rash under breast).    [provider]  nystatin (MYCOSTATIN/NYSTOP) powder Apply 1 application topically 3 (three) times daily as needed for rash. 06/11/20   [provider]  OZEMPIC, 0.25 OR 0.5 MG/DOSE, 2 MG/3ML SOPN Inject 0.5 mg into the skin once a week.    [provider]  rosuvastatin  (CRESTOR ) 20 MG tablet Take 20 mg by mouth daily.    [provider]  spironolactone  (ALDACTONE ) 25 MG tablet Take 25 mg by mouth daily.  06/06/20   [provider]  triamcinolone  lotion (KENALOG ) 0.1 % Apply 1 application topically daily as needed (Reash under breast).    [provider]  trimethoprim  (TRIMPEX ) 100 MG tablet Take 100 mg by mouth daily.    [provider]  valsartan  (DIOVAN ) 320 MG tablet Take 320 mg by mouth at bedtime. 06/24/20   [provider]    Physical Exam: BP (!) 141/52   Pulse 76   Temp 97.9 F (36.6 C) (Oral)   Resp 16   Ht 5\' 4"  (1.626 m)   Wt 122.5 kg   SpO2 99%   BMI 46.35 kg/m   General: 74 y.o. year-old female well developed well nourished in no acute distress.  Alert and oriented x3. Cardiovascular: Regular rate and rhythm with no rubs or gallops.  No thyromegaly or JVD noted.  No lower extremity edema. 2/4 pulses in all 4 extremities. Respiratory: Clear to auscultation with no wheezes or rales. Good inspiratory effort. Abdomen: Soft nontender nondistended with normal bowel sounds x4 quadrants. Muskuloskeletal: No cyanosis, clubbing or edema noted bilaterally Neuro: CN II-XII intact, strength, sensation, reflexes Skin: No ulcerative lesions noted or rashes Psychiatry: Judgement and insight appear  normal. Mood is appropriate for condition and setting          Labs on Admission:  Basic Metabolic Panel: Recent Labs  Lab 07/16/23 1810  NA 139  K 4.5  CL 107  CO2 24  GLUCOSE 100*  BUN 20  CREATININE 1.16*  CALCIUM  10.0   Liver Function Tests: Recent Labs  Lab 07/16/23 1810  AST 16  ALT 10  ALKPHOS 46  BILITOT 0.9  PROT 7.2  ALBUMIN  3.9   No results for input(s): "LIPASE", "AMYLASE" in the last 168 hours. No results for input(s): "AMMONIA" in the last 168 hours. CBC: Recent Labs  Lab 07/16/23 1810  WBC 8.0  NEUTROABS 6.4  HGB 14.9  HCT 46.4*  MCV 85.8  PLT 194   Cardiac Enzymes: No results for input(s): "CKTOTAL", "CKMB", "CKMBINDEX", "TROPONINI" in the last 168 hours.  BNP (last 3 results) No results for input(s): "BNP" in the last 8760 hours.  ProBNP (last 3 results) No results for input(s): "PROBNP" in the last 8760 hours.  CBG: No results for input(s): "GLUCAP" in the last 168 hours.  Radiological Exams on Admission: DG Chest Portable 1 View Result Date: 07/16/2023 CLINICAL DATA:  Cough, near syncope. EXAM: PORTABLE CHEST 1 VIEW COMPARISON:  08/12/2022. FINDINGS: The heart is enlarged the mediastinal contours within normal limits. There is atherosclerotic calcification of the aorta. Lung volumes are low. The pulmonary vasculature is mildly distended. No consolidation, effusion, or pneumothorax is seen. No acute osseous abnormality. IMPRESSION: Cardiomegaly with mildly distended pulmonary vasculature. Electronically Signed   By: Wyvonnia Heimlich M.D.   On: 07/16/2023 18:52    EKG: I independently viewed the EKG done and my findings are as followed: Sinus rhythm rate of 69.  Nonspecific ST-T changes.  TC 488.  Assessment/Plan Present on Admission: **None**  Principal Problem:   Generalized weakness  Generalized weakness, suspect multifactorial secondary to UTI, dehydration Continue to [name] underlying condition UA positive for pyuria, follow  urine culture for ID and sensitivities Continue Rocephin empirically Continue gentle IV fluid hydration PT OT evaluation Fall precautions  Presumptive UTI, POA UA positive for pyuria Continue IV antibiotics and follow urine culture  AKI, prerenal in the setting of dehydration from poor oral intake Presented with creatinine of 1.16 Baseline creatinine 0.6 with  GFR grain 60 Avoid nephrotoxic agents, dehydration, and hypotension Continue IV fluid hydration Monitor urine output Repeat BMP in the morning.  Severe morbid obesity BMI 46 Recommend weight loss outpatient Follow-up with primary care provider.  Chronic HFpEF No evidence of volume overload on exam Closely monitor volume status while on IV fluid hydration Start strict I's and O's and daily weight  History of CVA Resume home regimen.   Critical care time: 55 minutes.   DVT prophylaxis: Subcu Lovenox  daily.  Code Status: Full code.  Family Communication: None at bedside.  Disposition Plan: Admitted to telemetry medical unit.  Consults called: None.  Admission status: Inpatient status.   Status is: Inpatient The patient requires at least 2 midnights for further evaluation and treatment of present condition.   Bary Boss MD [facility] Pager [phone]  If 7PM-7AM, please contact night-coverage [email or web address] Password [email or web address]  07/16/2023, 10:12 PM

## 2023-07-16 NOTE — ED Provider Notes (Signed)
 [place] EMERGENCY DEPARTMENT AT [facility] Provider Note   CSN: [id number] Arrival date & time: 07/16/23  1735     History  Chief Complaint  Patient presents with   Altered Mental Status    [name] [name] is a 73 y.o. female.  HPI 73 year old female presents with lightheadedness, near syncope and generalized weakness.  Symptoms started this afternoon.  She has had similar symptoms to this over the past month or so but typically goes away on its own and she has not sought medical care.  She states in general since getting on Ozempic she does not have a great appetite.  Today she was feeling weak and lightheaded and this progressed to near syncope.  Felt like she had to go to the bathroom and then felt herself nodding off but does not think she passed out on the toilet.  EMS found her to be hypotensive and gave her about 500 cc IV fluid.  Patient is feeling better currently.  She has chronic right-sided leg more than arm weakness from a prior stroke.  No new unilateral weakness but does feel overall weak.  No new headache, chest pain, shortness of breath.  She had a little bit of a cough this morning that went away.  No urinary symptoms.  She has some diarrhea couple days ago but none today.  No blood in the stools.  Home Medications Prior to Admission medications   Medication Sig Start Date End Date Taking? Authorizing Provider  albuterol  (VENTOLIN  HFA) 108 (90 Base) MCG/ACT inhaler Inhale 2 puffs into the lungs every 4 (four) hours as needed for wheezing or shortness of breath. 07/25/20   [provider]  amLODipine  (NORVASC ) 5 MG tablet Take 1 tablet (5 mg total) by mouth daily. 08/16/22   Imogene Mana, NP  aspirin  81 MG chewable tablet Chew 1 tablet (81 mg total) by mouth daily. 08/16/22   Imogene Mana, NP  carvedilol  (COREG ) 25 MG tablet Take 1 tablet (25 mg total) by mouth 2 (two) times daily with a meal. 08/15/22   Imogene Mana, NP  clopidogrel  (PLAVIX ) 75 MG  tablet Take 1 tablet (75 mg total) by mouth daily. 10/11/17   Lavaughn Portland, MD  diclofenac  (VOLTAREN ) 75 MG EC tablet Take 75 mg by mouth daily as needed for mild pain or moderate pain.    [provider]  estradiol (ESTRACE) 0.1 MG/GM vaginal cream Place 1 Applicatorful vaginally daily as needed (vaginal burning). 06/30/20   [provider]  ezetimibe (ZETIA) 10 MG tablet Take 10 mg by mouth daily. 09/10/21   [provider]  gabapentin  (NEURONTIN ) 300 MG capsule Take 1 capsule (300 mg total) by mouth 3 (three) times daily. 04/14/23   Raydell Cahill R, DO  lidocaine  (XYLOCAINE ) 2 % solution Use as directed 15 mLs in the mouth or throat every 3 (three) hours as needed for mouth pain (Sore throat). 03/31/21   Eloise Hake Scales, PA-C  Lifitegrast (XIIDRA) 5 % SOLN Place 1 drop into both eyes 2 (two) times daily. 03/27/19   [provider]  minoxidil  (LONITEN ) 10 MG tablet Take 10 mg by mouth 2 (two) times daily.    [provider]  minoxidil  (LONITEN ) 10 MG tablet Take 1 tablet (10 mg total) by mouth daily. 08/16/22   Imogene Mana, NP  mupirocin cream (BACTROBAN) 2 % Apply 1 application topically daily as needed (rash under breast).    [provider]  nystatin (MYCOSTATIN/NYSTOP) powder Apply 1  application topically 3 (three) times daily as needed for rash. 06/11/20   [provider]  OZEMPIC, 0.25 OR 0.5 MG/DOSE, 2 MG/3ML SOPN Inject 0.5 mg into the skin once a week.    [provider]  rosuvastatin  (CRESTOR ) 20 MG tablet Take 20 mg by mouth daily.    [provider]  spironolactone  (ALDACTONE ) 25 MG tablet Take 25 mg by mouth daily. 06/06/20   [provider]  triamcinolone  lotion (KENALOG ) 0.1 % Apply 1 application topically daily as needed (Reash under breast).    [provider]  trimethoprim  (TRIMPEX ) 100 MG tablet Take 100 mg by mouth daily.    [provider]  valsartan  (DIOVAN ) 320 MG tablet  Take 320 mg by mouth at bedtime. 06/24/20   [provider]      Allergies    Metoprolol  tartrate    Review of Systems   Review of Systems  Constitutional:  Negative for fever.  Respiratory:  Positive for cough. Negative for shortness of breath.   Cardiovascular:  Negative for chest pain.  Gastrointestinal:  Negative for abdominal pain and vomiting.  Genitourinary:  Negative for dysuria.  Neurological:  Positive for weakness and light-headedness. Negative for syncope and headaches.    Physical Exam Updated Vital Signs BP (!) 118/38 (BP Location: Right Arm)   Pulse 75   Resp 16   Ht 5\' 4"  (1.626 m)   Wt 122.5 kg   SpO2 93%   BMI 46.35 kg/m  Physical Exam Vitals and nursing note reviewed.  Constitutional:      Appearance: She is well-developed. She is obese.  HENT:     Head: Normocephalic and atraumatic.  Eyes:     Extraocular Movements: Extraocular movements intact.     Pupils: Pupils are equal, round, and reactive to light.  Cardiovascular:     Rate and Rhythm: Normal rate and regular rhythm.     Heart sounds: Normal heart sounds.  Pulmonary:     Effort: Pulmonary effort is normal.     Breath sounds: Normal breath sounds.  Abdominal:     Palpations: Abdomen is soft.     Tenderness: There is no abdominal tenderness.  Musculoskeletal:     Cervical back: No rigidity.  Skin:    General: Skin is warm and dry.  Neurological:     Mental Status: She is alert and oriented to person, place, and time.     Comments: Patient is awake, alert, oriented to person, place, time, situation.  No facial droop or slurred speech.  5/5 strength in both upper extremities.  Left lower extremity she is able to lift off the stretcher though appears mildly weak.  The right lower extremity she can barely lift up the stretcher but states this is about normal for her.     ED Results / Procedures / Treatments   Labs (all labs ordered are listed, but only abnormal results are  displayed) Labs Reviewed  CBC WITH DIFFERENTIAL/PLATELET  COMPREHENSIVE METABOLIC PANEL WITH GFR  URINALYSIS, W/ REFLEX TO CULTURE (INFECTION SUSPECTED)    EKG None  Radiology No results found.  Procedures .Ultrasound ED Peripheral IV (Provider)  Date/Time: 07/16/2023 8:08 PM  Performed by: Jerilynn Montenegro, MD Authorized by: Jerilynn Montenegro, MD   Procedure details:    Indications: multiple failed IV attempts     Skin Prep: chlorhexidine  gluconate     Location:  Right AC   Angiocath:  20 G   Bedside Ultrasound Guided: Yes  Images: archived     Patient tolerated procedure without complications: Yes     Dressing applied: Yes       Medications Ordered in ED Medications  lactated ringers  bolus 1,000 mL (has no administration in time range)    ED Course/ Medical Decision Making/ A&P                                 Medical Decision Making Amount and/or Complexity of Data Reviewed Labs: ordered.    Details: Mild acute kidney injury Radiology: ordered and independent interpretation performed.    Details: No Pneumonia   Patient presents with weakness and near syncope. She is not altered (thought that is listed as a part of her chief complaint). She is well appearing here, hypotension has resolved though still has some widened pulse pressures. She was given a liter of fluid here in addition to the fluid given by EMS. No further hypotension.  I suspect her hypotension and near syncope are probably related to poor p.o. intake and her blood pressure medicine.  At this time, no signs or symptoms of infection.  Check some orthostatic after her fluids to see how she feels and how her blood pressure response.  Urine is currently pending.  Care transferred to Dr. Ranelle Buys.        Final Clinical Impression(s) / ED Diagnoses Final diagnoses:  None    Rx / DC Orders ED Discharge Orders     None         Jerilynn Montenegro, MD 07/16/23 2059

## 2023-07-16 NOTE — ED Notes (Signed)
 This RN attempted to get blood cultures, unsuccessful but will send down what was obtained. MD Ranelle Buys notified; no new orders.

## 2023-07-16 NOTE — ED Triage Notes (Signed)
 Patient with decreased appetite, dizziness, and generalized weakness all day. A family member helped her get to the toilet after falling in the floor and called EMS. They report on their arrival strong UTI smell, patient semi-responsive and hypotensive 80/40 without radial pulses. 500cc bolus given without improvement. Last BP 74/40.

## 2023-07-17 ENCOUNTER — Inpatient Hospital Stay (HOSPITAL_COMMUNITY)

## 2023-07-17 ENCOUNTER — Other Ambulatory Visit (HOSPITAL_COMMUNITY)

## 2023-07-17 DIAGNOSIS — R531 Weakness: Secondary | ICD-10-CM | POA: Diagnosis not present

## 2023-07-17 LAB — BASIC METABOLIC PANEL WITH GFR
Anion gap: 8 (ref 5–15)
BUN: 18 mg/dL (ref 8–23)
CO2: 22 mmol/L (ref 22–32)
Calcium: 9.5 mg/dL (ref 8.9–10.3)
Chloride: 111 mmol/L (ref 98–111)
Creatinine, Ser: 0.74 mg/dL (ref 0.44–1.00)
GFR, Estimated: >60 mL/min (ref >60)
Glucose, Bld: 99 mg/dL (ref 70–99)
Potassium: 3.9 mmol/L (ref 3.5–5.1)
Sodium: 141 mmol/L (ref 135–145)

## 2023-07-17 LAB — CBC
HCT: 44.7 % (ref 36.0–46.0)
Hemoglobin: 14.2 g/dL (ref 12.0–15.0)
MCH: 27.3 pg (ref 26.0–34.0)
MCHC: 31.8 g/dL (ref 30.0–36.0)
MCV: 85.8 fL (ref 80.0–100.0)
Platelets: 173 10*3/uL (ref 150–400)
RBC: 5.21 MIL/uL — ABNORMAL HIGH (ref 3.87–5.11)
RDW: 16.3 % — ABNORMAL HIGH (ref 11.5–15.5)
WBC: 6.1 10*3/uL (ref 4.0–10.5)
nRBC: 0 % (ref 0.0–0.2)

## 2023-07-17 LAB — PHOSPHORUS: Phosphorus: 3.7 mg/dL (ref 2.5–4.6)

## 2023-07-17 LAB — CK: Total CK: 143 U/L (ref 38–234)

## 2023-07-17 LAB — MAGNESIUM: Magnesium: 1.9 mg/dL (ref 1.7–2.4)

## 2023-07-17 MED ORDER — ASPIRIN 81 MG PO CHEW
81.0000 mg | CHEWABLE_TABLET | Freq: Every day | ORAL | Status: DC
  Administered 2023-07-17 – 2023-07-19 (×3): 81 mg via ORAL
  Filled 2023-07-17 (×3): qty 1

## 2023-07-17 MED ORDER — HYDRALAZINE HCL 20 MG/ML IJ SOLN
10.0000 mg | Freq: Four times a day (QID) | INTRAMUSCULAR | Status: DC | PRN
  Administered 2023-07-18: 10 mg via INTRAVENOUS
  Filled 2023-07-17: qty 1

## 2023-07-17 MED ORDER — ASPIRIN 81 MG PO CHEW: 81.0000 mg | CHEWABLE_TABLET | Freq: Every day | ORAL | Status: DC

## 2023-07-17 MED ORDER — CLOPIDOGREL BISULFATE 75 MG PO TABS
75.0000 mg | ORAL_TABLET | Freq: Every day | ORAL | Status: DC
  Administered 2023-07-17 – 2023-07-19 (×3): 75 mg via ORAL
  Filled 2023-07-17 (×3): qty 1

## 2023-07-17 MED ORDER — EZETIMIBE 10 MG PO TABS
10.0000 mg | ORAL_TABLET | Freq: Every day | ORAL | Status: DC
  Administered 2023-07-18 – 2023-07-19 (×2): 10 mg via ORAL
  Filled 2023-07-17 (×3): qty 1

## 2023-07-17 MED ORDER — ROSUVASTATIN CALCIUM 20 MG PO TABS
20.0000 mg | ORAL_TABLET | Freq: Every day | ORAL | Status: DC
  Administered 2023-07-18 – 2023-07-19 (×2): 20 mg via ORAL
  Filled 2023-07-17 (×3): qty 1

## 2023-07-17 MED ORDER — HYDRALAZINE HCL 50 MG PO TABS
50.0000 mg | ORAL_TABLET | Freq: Four times a day (QID) | ORAL | Status: DC | PRN
  Administered 2023-07-17 – 2023-07-18 (×2): 50 mg via ORAL
  Filled 2023-07-17 (×2): qty 1

## 2023-07-17 NOTE — Evaluation (Signed)
 Physical Therapy Evaluation Patient Details Name: Shanaiya Bene MRN: [id number] DOB: 1951-02-03 Today's Date: 07/17/2023  History of Present Illness  [name] [name] is a 73 y.o. female who presents to the ER due to generalized weakness; with medical history significant for severe morbid obesity, OSA intolerant of CPAP, prior CVA with residual right-sided weakness, PAD, left carotid stenosis status post recent left carotid artery stent placement, type 2 diabetes, hypertension, chronic HFpEF, overactive bladder,  Clinical Impression   Pt admitted with above diagnosis. Lives at home with adult daughter, in a two-level home with 1 steps to enter; Prior to admission, pt was able to walk without an assistive device, manage ADLs independently, drive; Presents to PT with generalized weakness, unsteady gait, a functional decline compared to her baseline; Pt stood with CGA for safety, walked to and from bathroom with minguard assist before needing to sit down due to knee pain;  Pt currently with functional limitations due to the deficits listed below (see PT Problem List). Pt will benefit from skilled PT to increase their independence and safety with mobility to allow discharge to the venue listed below.           If plan is discharge home, recommend the following: A little help with bathing/dressing/bathroom;Assistance with cooking/housework   Can travel by private vehicle        Equipment Recommendations None recommended by PT (pretty well-equipped)  Recommendations for Other Services  OT consult (as ordered)    Functional Status Assessment Patient has had a recent decline in their functional status and demonstrates the ability to make significant improvements in function in a reasonable and predictable amount of time.     Precautions / Restrictions Precautions Precautions: Fall Recall of Precautions/Restrictions: Intact Restrictions Weight Bearing Restrictions Per Provider Order: No       Mobility  Bed Mobility                    Transfers Overall transfer level: Needs assistance Equipment used: Rolling walker (2 wheels) Transfers: Sit to/from Stand Sit to Stand: Contact guard assist           General transfer comment: smooth rise, but slow; reported stiffness once standing    Ambulation/Gait Ambulation/Gait assistance: Contact guard assist Gait Distance (Feet): 20 Feet (to/from bathroom) Assistive device: Rolling walker (2 wheels) Gait Pattern/deviations: Step-through pattern, Decreased step length - right, Decreased step length - left       General Gait Details: Cues to self-monitor for activity tolerance  Stairs            Wheelchair Mobility     Tilt Bed    Modified Rankin (Stroke Patients Only)       Balance Overall balance assessment: Needs assistance   Sitting balance-Leahy Scale: Fair       Standing balance-Leahy Scale: Poor (approaching fair)                               Pertinent Vitals/Pain Pain Assessment Pain Assessment: Faces Faces Pain Scale: Hurts a little bit Pain Location: bil knees, arthritis pain Pain Descriptors / Indicators: Tightness, Sore Pain Intervention(s): Repositioned    Home Living Family/patient expects to be discharged to:: Private residence Living Arrangements: Children (lives with her daughter) Available Help at Discharge: Family;Available PRN/intermittently Type of Home: Apartment Home Access: Stairs to enter Entrance Stairs-Rails: None Entrance Stairs-Number of Steps: 1   Home Layout: Multi-level;Able to live on main level with  bedroom/bathroom;1/2 bath on main level Home Equipment: Rolling Walker (2 wheels);Rollator (4 wheels);Cane - single point;Wheelchair - manual Additional Comments: typically uses a cane    Prior Function Prior Level of Function : Driving             Mobility Comments: used cane in community, reports use of WC in the home, no falls ADLs  Comments: independent bird bath as no full bath onfirst floor, pt reports use of recliner for sleep during the day, often with feet down     Extremity/Trunk Assessment   Upper Extremity Assessment Upper Extremity Assessment: Defer to OT evaluation    Lower Extremity Assessment Lower Extremity Assessment: Generalized weakness (and some arthritis pain and stiffness)       Communication   Communication Communication: Impaired Factors Affecting Communication: Difficulty expressing self (at times)    Cognition Arousal: Alert Behavior During Therapy: WFL for tasks assessed/performed                           PT - Cognition Comments: Will benefit from further assessment Following commands: Intact       Cueing Cueing Techniques: Verbal cues     General Comments General comments (skin integrity, edema, etc.): Able to void on toilet in bathroom; difficulty moving bowels    Exercises     Assessment/Plan    PT Assessment Patient needs continued PT services  PT Problem List Decreased strength;Decreased activity tolerance;Decreased balance;Decreased mobility;Decreased knowledge of use of DME;Decreased safety awareness;Decreased knowledge of precautions;Pain       PT Treatment Interventions DME instruction;Gait training;Stair training;Functional mobility training;Therapeutic activities;Therapeutic exercise;Balance training;Neuromuscular re-education;Cognitive remediation;Patient/family education;Wheelchair mobility training    PT Goals (Current goals can be found in the Care Plan section)  Acute Rehab PT Goals Patient Stated Goal: get home soon PT Goal Formulation: With patient Time For Goal Achievement: 07/31/23 Potential to Achieve Goals: Good    Frequency Min 2X/week     Co-evaluation               AM-PAC PT "6 Clicks" Mobility  Outcome Measure Help needed turning from your back to your side while in a flat bed without using bedrails?: A Little Help  needed moving from lying on your back to sitting on the side of a flat bed without using bedrails?: A Little Help needed moving to and from a bed to a chair (including a wheelchair)?: A Little Help needed standing up from a chair using your arms (e.g., wheelchair or bedside chair)?: A Little Help needed to walk in hospital room?: A Little Help needed climbing 3-5 steps with a railing? : A Little 6 Click Score: 18    End of Session Equipment Utilized During Treatment: Gait belt Activity Tolerance: Patient tolerated treatment well Patient left: in chair;with call bell/phone within reach;with chair alarm set;with nursing/sitter in room Nurse Communication: Mobility status PT Visit Diagnosis: Unsteadiness on feet (R26.81)    Time: 1450-1528 (minus approx 5 minutes as pt was on teh commode) PT Time Calculation (min) (ACUTE ONLY): 38 min   Charges:   PT Evaluation $PT Eval Low Complexity: 1 Low PT Treatments $Gait Training: 8-22 mins PT General Charges $$ ACUTE PT VISIT: 1 Visit         Darcus Eastern, PT  Acute Rehabilitation Services Office [phone] Secure Chat welcomed   Marcial Setting 07/17/2023, 4:53 PM

## 2023-07-17 NOTE — Progress Notes (Signed)
 [facility] Progress Note  Patient: [name] [name]    GMW:[id number]  DOA: 07/16/2023     Date of Service: the patient was seen and examined on 07/17/2023  Chief Complaint  Patient presents with   Altered Mental Status   Brief hospital course: [name] [name] is a 73 y.o. female with medical history significant for severe morbid obesity, OSA intolerant of CPAP, prior CVA with residual right-sided weakness, PAD, left carotid stenosis status post recent left carotid artery stent placement, type 2 diabetes, hypertension, chronic HFpEF, overactive bladder, who presents to the ER due to generalized weakness.  Oral intake has been poor since starting Ozempic over a month ago.  Endorses some dysuria and loose stools 2 days ago.  EMS was activated.  Upon EMS arrival the patient was noted to be hypotensive with SBP's in the 80s, improved with IV fluid bolus.  No reported subjective fevers or chills.    In the ER, had recurrent hypotension with MAP of 59, responded well to an additional 1 L IV fluid bolus LR.  UA was positive for pyuria.  Urine culture and peripheral blood cultures x 2 were collected.  The patient was started on empiric IV antibiotics Rocephin.  TRH, hospitalist service, was asked to admit.   ED Course: Temperature 98.  BP 133/76, pulse 76, respiration rate 16.  O2 saturation 95% on room air.  Lab studies notable for creatinine 1.16 with GFR 50.   Assessment and Plan:  # Generalized weakness, suspect multifactorial secondary to UTI, hypotension due to dehydration  Continue to treat underlying condition Continue gentle IV fluid hydration PT OT evaluation Fall precautions-ambulates with a cane.   # Presumptive UTI, POA UA positive for pyuria Continue Rocephin empirically follow urine culture, peripheral blood cultures x 2. 6/1 c/o urinary incontinence, check bladder scan and renal sonogram to rule out overflow incontinence    # AKI, prerenal in the setting of dehydration  from poor oral intake.  Resolved Presented with creatinine of 1.16 Baseline creatinine 0.6 with GFR grain 60 Avoid nephrotoxic agents, dehydration, and hypotension S/p IV fluid hydration Monitor urine output Repeat BMP in the morning.     Chronic HFpEF and HTN No evidence of volume overload on exam Closely monitor volume status, s/p IV fluid hydration Start strict I's and O's and daily weight Blood pressure was low on admission, Continue to hold home medications for now Monitor BP and titrate medications accordingly Use hydralazine  p.o. versus IV as needed as per BP    History of CVA Resumed DAPT and statin      Super morbid obesity Body mass index is 46.35 kg/m.  Interventions: Recommend weight loss outpatient Follow-up with primary care provider.  Diet: Regular diet DVT Prophylaxis: Subcutaneous Lovenox    Advance goals of care discussion: Full code  Family Communication: family was not present at bedside, at the time of interview.  The pt provided permission to discuss medical plan with the family. Opportunity was given to ask question and all questions were answered satisfactorily.   Disposition:  Pt is from home, admitted with generalized weakness due to hypotension and UTI, still on IV antibiotics and culture pending, which precludes a safe discharge. Discharge to home, when stable, most likely in 1 to 2 days.  Subjective: No significant events overnight, patient feels improvement, still little bit sleepy.  Complaining of dysuria.  Denied any abdominal pain, no chest pain or palpitation, no shortness of breath.   Physical Exam: General: NAD, lying comfortably Appear in no  distress, affect appropriate Eyes: PERRLA ENT: Oral Mucosa Clear, moist  Neck: no JVD,  Cardiovascular: S1 and S2 Present, no Murmur,  Respiratory: good respiratory effort, Bilateral Air entry equal and Decreased, no Crackles, no wheezes Abdomen: Bowel Sound present, Soft and no tenderness,   Skin: no rashes Extremities: no Pedal edema, no calf tenderness Neurologic: Mild weakness on the right side due to prior stroke Gait not checked due to patient safety concerns  Vitals:   07/16/23 2318 07/17/23 0440 07/17/23 0751 07/17/23 1228  BP: 133/76 (!) 122/42 (!) 143/46 (!) 129/41  Pulse: 76 80 81 93  Resp: 16 18 18 17   Temp: 97.8 F (36.6 C) 97.7 F (36.5 C) 98 F (36.7 C) 97.9 F (36.6 C)  TempSrc: Oral Oral Oral Oral  SpO2: 95% 98% 99% 96%  Weight:      Height:        Intake/Output Summary (Last 24 hours) at 07/17/2023 1517 Last data filed at 07/17/2023 1440 Gross per 24 hour  Intake 2446.02 ml  Output 800 ml  Net 1646.02 ml   Filed Weights   07/16/23 1738  Weight: 122.5 kg    Data Reviewed: I have personally reviewed and interpreted daily labs, tele strips, imagings as discussed above. I reviewed all nursing notes, pharmacy notes, vitals, pertinent old records I have discussed plan of care as described above with RN and patient/family.  CBC: Recent Labs  Lab 07/16/23 1810 07/17/23 0633  WBC 8.0 6.1  NEUTROABS 6.4  --   HGB 14.9 14.2  HCT 46.4* 44.7  MCV 85.8 85.8  PLT 194 173   Basic Metabolic Panel: Recent Labs  Lab 07/16/23 1810 07/17/23 0633  NA 139 141  K 4.5 3.9  CL 107 111  CO2 24 22  GLUCOSE 100* 99  BUN 20 18  CREATININE 1.16* 0.74  CALCIUM  10.0 9.5  MG  --  1.9  PHOS  --  3.7    Studies: DG Chest Portable 1 View Result Date: 07/16/2023 CLINICAL DATA:  Cough, near syncope. EXAM: PORTABLE CHEST 1 VIEW COMPARISON:  08/12/2022. FINDINGS: The heart is enlarged the mediastinal contours within normal limits. There is atherosclerotic calcification of the aorta. Lung volumes are low. The pulmonary vasculature is mildly distended. No consolidation, effusion, or pneumothorax is seen. No acute osseous abnormality. IMPRESSION: Cardiomegaly with mildly distended pulmonary vasculature. Electronically Signed   By: Wyvonnia Heimlich M.D.   On:  07/16/2023 18:52    Scheduled Meds:  clopidogrel   75 mg Oral Daily   enoxaparin  (LOVENOX ) injection  40 mg Subcutaneous Q24H   ezetimibe  10 mg Oral Daily   rosuvastatin   20 mg Oral Daily   Continuous Infusions:  cefTRIAXone (ROCEPHIN)  IV     PRN Meds: acetaminophen , melatonin, polyethylene glycol, prochlorperazine  Time spent: 55 minutes  Author: Althia Atlas. MD [facility] 07/17/2023 3:17 PM  To reach On-call, see care teams to locate the attending and reach out to them via [email or web address]. If 7PM-7AM, please contact night-coverage If you still have difficulty reaching the attending provider, please page the [facility] (Director on Call) for [facility] on amion for assistance.

## 2023-07-18 DIAGNOSIS — R531 Weakness: Secondary | ICD-10-CM | POA: Diagnosis not present

## 2023-07-18 LAB — URINE CULTURE: Culture: >=100000 — AB

## 2023-07-18 LAB — BASIC METABOLIC PANEL WITH GFR
Anion gap: 8 (ref 5–15)
BUN: 9 mg/dL (ref 8–23)
CO2: 23 mmol/L (ref 22–32)
Calcium: 9.6 mg/dL (ref 8.9–10.3)
Chloride: 110 mmol/L (ref 98–111)
Creatinine, Ser: 0.65 mg/dL (ref 0.44–1.00)
GFR, Estimated: >60 mL/min (ref >60)
Glucose, Bld: 86 mg/dL (ref 70–99)
Potassium: 3.8 mmol/L (ref 3.5–5.1)
Sodium: 141 mmol/L (ref 135–145)

## 2023-07-18 LAB — CBC
HCT: 44.1 % (ref 36.0–46.0)
Hemoglobin: 14.2 g/dL (ref 12.0–15.0)
MCH: 27.6 pg (ref 26.0–34.0)
MCHC: 32.2 g/dL (ref 30.0–36.0)
MCV: 85.8 fL (ref 80.0–100.0)
Platelets: 201 10*3/uL (ref 150–400)
RBC: 5.14 MIL/uL — ABNORMAL HIGH (ref 3.87–5.11)
RDW: 16.2 % — ABNORMAL HIGH (ref 11.5–15.5)
WBC: 3.2 10*3/uL — ABNORMAL LOW (ref 4.0–10.5)
nRBC: 0 % (ref 0.0–0.2)

## 2023-07-18 LAB — MAGNESIUM: Magnesium: 1.9 mg/dL (ref 1.7–2.4)

## 2023-07-18 LAB — PHOSPHORUS: Phosphorus: 3.1 mg/dL (ref 2.5–4.6)

## 2023-07-18 MED ORDER — MINOXIDIL 10 MG PO TABS
10.0000 mg | ORAL_TABLET | Freq: Two times a day (BID) | ORAL | Status: DC
  Administered 2023-07-18 – 2023-07-19 (×3): 10 mg via ORAL
  Filled 2023-07-18 (×4): qty 1

## 2023-07-18 MED ORDER — CEPHALEXIN 500 MG PO CAPS: 500.0000 mg | ORAL_CAPSULE | Freq: Three times a day (TID) | ORAL | 0 refills | Status: AC

## 2023-07-18 MED ORDER — IRBESARTAN 300 MG PO TABS: 300.0000 mg | ORAL_TABLET | Freq: Every day | ORAL | Status: DC

## 2023-07-18 MED ORDER — AMLODIPINE BESYLATE 5 MG PO TABS: 5.0000 mg | ORAL_TABLET | Freq: Every day | ORAL | Status: DC

## 2023-07-18 MED ORDER — ROPINIROLE HCL 0.5 MG PO TABS
0.5000 mg | ORAL_TABLET | Freq: Every day | ORAL | Status: DC
  Administered 2023-07-18: 0.5 mg via ORAL
  Filled 2023-07-18: qty 1

## 2023-07-18 MED ORDER — CARVEDILOL 25 MG PO TABS
25.0000 mg | ORAL_TABLET | Freq: Two times a day (BID) | ORAL | Status: DC
  Administered 2023-07-18 – 2023-07-19 (×2): 25 mg via ORAL
  Filled 2023-07-18 (×2): qty 1

## 2023-07-18 MED ORDER — CARVEDILOL 12.5 MG PO TABS: 12.5000 mg | ORAL_TABLET | Freq: Two times a day (BID) | ORAL | Status: DC

## 2023-07-18 MED ORDER — CARVEDILOL 12.5 MG PO TABS
12.5000 mg | ORAL_TABLET | Freq: Once | ORAL | Status: AC
  Administered 2023-07-18: 12.5 mg via ORAL
  Filled 2023-07-18: qty 1

## 2023-07-18 MED ORDER — HYDRALAZINE HCL 20 MG/ML IJ SOLN
10.0000 mg | Freq: Once | INTRAMUSCULAR | Status: AC
  Administered 2023-07-18: 10 mg via INTRAVENOUS
  Filled 2023-07-18: qty 1

## 2023-07-18 MED ORDER — LOSARTAN POTASSIUM 50 MG PO TABS
50.0000 mg | ORAL_TABLET | Freq: Every day | ORAL | Status: DC
  Administered 2023-07-18 – 2023-07-19 (×2): 50 mg via ORAL
  Filled 2023-07-18 (×2): qty 1

## 2023-07-18 NOTE — Evaluation (Addendum)
 Occupational Therapy Evaluation Patient Details Name: [name] [name] MRN: [id number] DOB: Feb 19, 1950 Today's Date: 07/18/2023   History of Present Illness   [name] [name] is a 73 y.o. female who presents to the ER due to generalized weakness; with medical history significant for severe morbid obesity, OSA intolerant of CPAP, prior CVA with residual right-sided weakness, PAD, left carotid stenosis status post recent left carotid artery stent placement, type 2 diabetes, hypertension, chronic HFpEF, overactive bladder,     Clinical Impressions PTA patient independent. Admitted for above and presents with problem list below. Patient demonstrates ability to manage ADLs with supervision, transfers and limited mobility in room with supervision.  No knee pain or LOB noted today.  Believe she is near baseline level for basic ADLs, but will follow acutely to optimize safety, educate on fall prevention, and promote increased independence/tolerance for IADL engagement.  No OT follow up after dc home.      If plan is discharge home, recommend the following:   A little help with walking and/or transfers;A little help with bathing/dressing/bathroom;Assistance with cooking/housework;Assist for transportation;Help with stairs or ramp for entrance     Functional Status Assessment   Patient has had a recent decline in their functional status and demonstrates the ability to make significant improvements in function in a reasonable and predictable amount of time.     Equipment Recommendations   None recommended by OT     Recommendations for Other Services         Precautions/Restrictions   Precautions Precautions: Fall Recall of Precautions/Restrictions: Intact Restrictions Weight Bearing Restrictions Per Provider Order: No     Mobility Bed Mobility               General bed mobility comments: OOB in recliner    Transfers Overall transfer level: Needs assistance Equipment  used: None Transfers: Sit to/from Stand Sit to Stand: Supervision                  Balance Overall balance assessment: Needs assistance Sitting-balance support: Feet supported, No upper extremity supported Sitting balance-Leahy Scale: Good     Standing balance support: No upper extremity supported, During functional activity Standing balance-Leahy Scale: Fair                             ADL either performed or assessed with clinical judgement   ADL Overall ADL's : Needs assistance/impaired     Grooming: Set up;Sitting           Upper Body Dressing : Set up;Sitting   Lower Body Dressing: Supervision/safety;Sit to/from stand   Toilet Transfer: Supervision/safety;Ambulation           Functional mobility during ADLs: Supervision/safety General ADL Comments: no AD, supervision for transfers and mobility     Vision   Vision Assessment?: No apparent visual deficits     Perception         Praxis         Pertinent Vitals/Pain Pain Assessment Pain Assessment: No/denies pain Pain Intervention(s): Limited activity within patient's tolerance, Monitored during session, Repositioned     Extremity/Trunk Assessment Upper Extremity Assessment Upper Extremity Assessment: Overall WFL for tasks assessed   Lower Extremity Assessment Lower Extremity Assessment: Defer to PT evaluation       Communication Communication Communication: Impaired Factors Affecting Communication: Difficulty expressing self (intermittently)   Cognition Arousal: Alert Behavior During Therapy: WFL for tasks assessed/performed Cognition: No apparent impairments  Following commands: Intact       Cueing  General Comments   Cueing Techniques: Verbal cues      Exercises     Shoulder Instructions      Home Living Family/patient expects to be discharged to:: Private residence Living Arrangements: Children  (daughter) Available Help at Discharge: Family;Available PRN/intermittently Type of Home: Apartment Home Access: Stairs to enter Entrance Stairs-Number of Steps: 1 Entrance Stairs-Rails: None Home Layout: Multi-level;Able to live on main level with bedroom/bathroom;1/2 bath on main level     Bathroom Shower/Tub: Sponge bathes at baseline;Tub/shower unit (tub shower upstairs)   Bathroom Toilet: Standard     Home Equipment: Agricultural consultant (2 wheels);Rollator (4 wheels);Cane - single point;Wheelchair - manual;BSC/3in1;Electric scooter (upright rollator)   Additional Comments: typically uses a cane; grandson assists as needed (61 y/o)      Prior Functioning/Environment Prior Level of Function : Independent/Modified Independent;Driving             Mobility Comments: used cane in community, reports use of WC in the home; amb in bathroom without UE support ADLs Comments: independent bird bath as no full bath onfirst floor, pt reports use of recliner for sleep during the day, often with feet down; independent ADLs, IADLs, driving    OT Problem List: Decreased activity tolerance;Decreased knowledge of use of DME or AE;Decreased knowledge of precautions;Obesity   OT Treatment/Interventions: Self-care/ADL training;DME and/or AE instruction;Therapeutic activities;Patient/family education;Balance training      OT Goals(Current goals can be found in the care plan section)   Acute Rehab OT Goals Patient Stated Goal: home OT Goal Formulation: With patient Time For Goal Achievement: 08/01/23 Potential to Achieve Goals: Good   OT Frequency:  Min 2X/week    Co-evaluation              AM-PAC OT "6 Clicks" Daily Activity     Outcome Measure Help from another person eating meals?: None Help from another person taking care of personal grooming?: A Little Help from another person toileting, which includes using toliet, bedpan, or urinal?: A Little Help from another person bathing  (including washing, rinsing, drying)?: A Little Help from another person to put on and taking off regular upper body clothing?: A Little Help from another person to put on and taking off regular lower body clothing?: A Little 6 Click Score: 19   End of Session Nurse Communication: Mobility status  Activity Tolerance: Patient tolerated treatment well Patient left: in chair;with call bell/phone within reach;with chair alarm set  OT Visit Diagnosis: Other abnormalities of gait and mobility (R26.89)                Time: 1130-1146 OT Time Calculation (min): 16 min Charges:  OT General Charges $OT Visit: 1 Visit OT Evaluation $OT Eval Low Complexity: 1 Low  Bary Boss, OT Acute Rehabilitation Services Office [phone] Secure Chat Preferred    Fredrich Jefferson 07/18/2023, 1:42 PM

## 2023-07-18 NOTE — TOC Initial Note (Signed)
 Transition of Care (TOC) - Initial/Assessment Note   Patient from home with daughter. Has DME.   Currently active with OP PT in [facility], unsure name of agency, [facility]  Patient Details  Name: [name] [name] MRN: [id number] Date of Birth: 1950-11-04  Transition of Care [facility]) CM/SW Contact:    Terre Ferri, RN Phone Number: 07/18/2023, 1:59 PM  Clinical Narrative:                   Expected Discharge Plan: Home/Self Care Barriers to Discharge: Continued Medical Work up   Patient Goals and CMS Choice Patient states their goals for this hospitalization and ongoing recovery are:: to return to home          Expected Discharge Plan and Services   Discharge Planning Services: CM Consult Post Acute Care Choice: NA Living arrangements for the past 2 months: Apartment Expected Discharge Date: 07/18/23               DME Arranged: N/A DME Agency: NA       HH Arranged: NA HH Agency: NA        Prior Living Arrangements/Services Living arrangements for the past 2 months: Apartment Lives with:: Adult Children Patient language and need for interpreter reviewed:: Yes Do you feel safe going back to the place where you live?: Yes      Need for Family Participation in Patient Care: Yes (Comment) Care giver support system in place?: Yes (comment)   Criminal Activity/Legal Involvement Pertinent to Current Situation/Hospitalization: No - Comment as needed  Activities of Daily Living   ADL Screening (condition at time of admission) Independently performs ADLs?: Yes (appropriate for developmental age) Is the patient deaf or have difficulty hearing?: No Does the patient have difficulty seeing, even when wearing glasses/contacts?: No Does the patient have difficulty concentrating, remembering, or making decisions?: No  Permission Sought/Granted   Permission granted to share information with : No              Emotional Assessment Appearance:: Appears stated  age Attitude/Demeanor/Rapport: Engaged Affect (typically observed): Appropriate Orientation: : Oriented to Self, Oriented to Place, Oriented to  Time, Oriented to Situation Alcohol / Substance Use: Not Applicable Psych Involvement: No (comment)  Admission diagnosis:  Generalized weakness [R53.1] Hypotension, unspecified hypotension type [I95.9] Urinary tract infection without hematuria, site unspecified [N39.0] Patient Active Problem List   Diagnosis Date Noted   Generalized weakness 07/16/2023   Hematoma of groin 08/12/2022   Endotracheal tube present 08/12/2022   AKI (acute kidney injury) (HCC) 08/12/2022   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/11/2022   Stroke (HCC) 10/06/2017   Acute CVA (cerebrovascular accident) (HCC) 10/06/2017   Trigger thumb, right thumb 05/03/2016   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   Protein-calorie malnutrition (HCC) 11/15/2014   Osteomyelitis of right knee region (HCC) 11/08/2014   Complication of device 11/04/2014   Septic arthritis (HCC) 11/04/2014   S/P right knee arthroscopy    UTI (urinary tract infection) 12/09/2012   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   Hyperlipidemia    Hypertension    PCP:  Victorio Grave, MD Pharmacy:   [id number] [place] Nestle, [place] - [address] AT [facility] OF [facility] [address] [place] [address] Phone: [phone] Fax: [phone]     Social Drivers of Health (SDOH) Social History: SDOH Screenings   Food Insecurity: No Food Insecurity (07/16/2023)  Housing: Low Risk  (07/16/2023)  Transportation Needs:  No Transportation Needs (07/16/2023)  Utilities: Not At Risk (07/16/2023)  Social Connections: Moderately Integrated (07/16/2023)  Stress: No Stress Concern Present (02/26/2021)   Received from [facility], [facility]  Tobacco Use: Medium Risk (07/16/2023)   SDOH Interventions:     Readmission Risk Interventions     No data to display

## 2023-07-18 NOTE — Progress Notes (Signed)
   07/18/23 0945  Spiritual Encounters  Type of Visit Initial  Care provided to: Patient  Conversation partners present during encounter Nurse  Reason for visit Advance directives  OnCall Visit No  Advance Directives (For Healthcare)  Does Patient Have a Medical Advance Directive? No  Would patient like information on creating a medical advance directive? Yes (Inpatient - patient defers creating a medical advance directive at this time - Information given)    No AD documents on file.  Chaplain responded to spiritual request for AD paperwork/information. Chaplain provided both. Pt states she will complete after she's discharged. Chaplain also informed pt of our spiritual care services, and remains available.

## 2023-07-18 NOTE — Progress Notes (Signed)
 Mobility Specialist Progress Note:    07/18/23 1016  Mobility  Activity Ambulated with assistance in room;Transferred from bed to chair  Level of Assistance Contact guard assist, steadying assist  Assistive Device None  Distance Ambulated (ft) 25 ft  Activity Response Tolerated well  Mobility Referral Yes  Mobility visit 1 Mobility  Mobility Specialist Start Time (ACUTE ONLY) 0957  Mobility Specialist Stop Time (ACUTE ONLY) 1016  Mobility Specialist Time Calculation (min) (ACUTE ONLY) 19 min   Pt received in bed agreeable to mobility. No physical assistance needed. Was able to get to EOB independently, nursing assisted in wash up. Pt was able to perform bathing needs on front half of body, required assistance w/ back. Was able to ambulate around room w/o fault. Left in chair w/ call bell and personal belongings in reach. Chair alarm on. Nursing staff in room.    Inetta Manes Mobility Specialist  Please contact vis Secure Chat or  Rehab Office [phone]

## 2023-07-18 NOTE — Progress Notes (Signed)
 [facility] Progress Note  Patient: [name] [name]    JXB:[id number]  DOA: 07/16/2023     Date of Service: the patient was seen and examined on 07/18/2023  Chief Complaint  Patient presents with   Altered Mental Status   Brief hospital course: [name] [name] is a 73 y.o. female with medical history significant for severe morbid obesity, OSA intolerant of CPAP, prior CVA with residual right-sided weakness, PAD, left carotid stenosis status post recent left carotid artery stent placement, type 2 diabetes, hypertension, chronic HFpEF, overactive bladder, who presents to the ER due to generalized weakness.  Oral intake has been poor since starting Ozempic over a month ago.  Endorses some dysuria and loose stools 2 days ago.  EMS was activated.  Upon EMS arrival the patient was noted to be hypotensive with SBP's in the 80s, improved with IV fluid bolus.  No reported subjective fevers or chills.    In the ER, had recurrent hypotension with MAP of 59, responded well to an additional 1 L IV fluid bolus LR.  UA was positive for pyuria.  Urine culture and peripheral blood cultures x 2 were collected.  The patient was started on empiric IV antibiotics Rocephin.  TRH, hospitalist service, was asked to admit.   ED Course: Temperature 98.  BP 133/76, pulse 76, respiration rate 16.  O2 saturation 95% on room air.  Lab studies notable for creatinine 1.16 with GFR 50.   Assessment and Plan:  # Generalized weakness, suspect multifactorial secondary to UTI, hypotension due to dehydration  Generalized weakness resolved, patient is back to her baseline. PT/OT evaluation done, recommended outpatient PT, no OT needs. Fall precautions-ambulates with a cane.   # Presumptive UTI, POA UA positive for pyuria Continue Rocephin empirically Blood culture NGTD, urine culture growing E. coli sensitive to cephalosporin and Macrobid. 6/1 c/o urinary incontinence, US  renal showed no hydronephrosis, chronic medical  renal disease, PVR 25 cc Patient was recommended to follow-up with urology as an outpatient for urodynamic studies Transition to oral antibiotics Keflex  500 mg p.o. 3 times daily for 5 days.   # AKI, prerenal in the setting of dehydration from poor oral intake.  Resolved Presented with creatinine of 1.16 Baseline creatinine 0.6 with GFR grain 60 Avoid nephrotoxic agents, dehydration, and hypotension S/p IV fluid hydration Monitor urine output Repeat BMP in the morning.     Chronic HFpEF and HTN No evidence of volume overload on exam Closely monitor volume status, s/p IV fluid hydration Start strict I's and O's and daily weight Blood pressure was low on admission, 6/1 Continue to hold home medications for now Monitor BP and titrate medications accordingly Use hydralazine  p.o. versus IV as needed as per BP 6/2 hypertensive urgency, BP is significantly elevated, resumed home dose Coreg  and minoxidil  Patient received IV hydralazine  as well Betesil blood pressure is high Started losartan 50 mg p.o. daily Monitor BP and titrate medications accordingly, we will plan to DC tomorrow a.m. if remains stable    History of CVA Resumed DAPT and statin      Super morbid obesity Body mass index is 46.35 kg/m.  Interventions: Recommend weight loss outpatient Follow-up with primary care provider.  Diet: Regular diet DVT Prophylaxis: Subcutaneous Lovenox    Advance goals of care discussion: Full code  Family Communication: family was not present at bedside, at the time of interview.  The pt provided permission to discuss medical plan with the family. Opportunity was given to ask question and all questions were answered  satisfactorily.   Disposition:  Pt is from home, admitted with generalized weakness due to hypotension and UTI, still on IV antibiotics and culture pending, which precludes a safe discharge. Discharge to home, when stable, most likely in 1 to 2 days.  Subjective: No  significant events overnight, patient was sitting on the recliner, overall feels improvement, denied any active issues at this time.  Patient was ready to go home. Blood pressure seems to be very elevated, so we will continue to monitor today and discharge plan tomorrow a.m.  Physical Exam: General: NAD, lying comfortably Appear in no distress, affect appropriate Eyes: PERRLA ENT: Oral Mucosa Clear, moist  Neck: no JVD,  Cardiovascular: S1 and S2 Present, no Murmur,  Respiratory: good respiratory effort, Bilateral Air entry equal and Decreased, no Crackles, no wheezes Abdomen: Bowel Sound present, Soft and no tenderness,  Skin: no rashes Extremities: no Pedal edema, no calf tenderness Neurologic: Mild weakness on the right side due to prior stroke Gait not checked due to patient safety concerns  Vitals:   07/18/23 0326 07/18/23 0733 07/18/23 1400 07/18/23 1526  BP: (!) 150/51 (!) 150/125 (!) 199/77 (!) 197/70  Pulse: 93 89  87  Resp:  16  16  Temp:  97.6 F (36.4 C)  97.7 F (36.5 C)  TempSrc:  Oral  Oral  SpO2:  100%  100%  Weight:      Height:        Intake/Output Summary (Last 24 hours) at 07/18/2023 1540 Last data filed at 07/18/2023 1439 Gross per 24 hour  Intake 436 ml  Output 2050 ml  Net -1614 ml   Filed Weights   07/16/23 1738  Weight: 122.5 kg    Data Reviewed: I have personally reviewed and interpreted daily labs, tele strips, imagings as discussed above. I reviewed all nursing notes, pharmacy notes, vitals, pertinent old records I have discussed plan of care as described above with RN and patient/family.  CBC: Recent Labs  Lab 07/16/23 1810 07/17/23 0633 07/18/23 0714  WBC 8.0 6.1 3.2*  NEUTROABS 6.4  --   --   HGB 14.9 14.2 14.2  HCT 46.4* 44.7 44.1  MCV 85.8 85.8 85.8  PLT 194 173 201   Basic Metabolic Panel: Recent Labs  Lab 07/16/23 1810 07/17/23 0633 07/18/23 0714  NA 139 141 141  K 4.5 3.9 3.8  CL 107 111 110  CO2 24 22 23   GLUCOSE  100* 99 86  BUN 20 18 9   CREATININE 1.16* 0.74 0.65  CALCIUM  10.0 9.5 9.6  MG  --  1.9 1.9  PHOS  --  3.7 3.1    Studies: US  RENAL Result Date: 07/17/2023 CLINICAL DATA:  Incontinence EXAM: RENAL / URINARY TRACT ULTRASOUND COMPLETE COMPARISON:  None Available. FINDINGS: Right Kidney: Renal measurements: 10.9 x 5.7 x 6.1 cm = volume: 198 mL. There is no hydronephrosis. There is increased echogenicity diffusely. There is a cyst in the mid kidney measuring 1.5 x 1.7 x 1.6 cm. Left Kidney: Renal measurements: 12.6 x 5.3 x 4.7 cm = volume: 165 mL. There is increased echogenicity. There is no hydronephrosis. No focal lesion identified. Bladder: Appears normal for degree of bladder distention. Postvoid residual of 25 cc. Other: None. IMPRESSION: 1. No hydronephrosis. 2. Increased echogenicity of the kidneys bilaterally consistent with medical renal disease. 3. Small right renal cyst. 4. Postvoid residual of 25 cc. Electronically Signed   By: Tyron Gallon M.D.   On: 07/17/2023 20:21    Scheduled Meds:  aspirin   81 mg Oral Daily   carvedilol   25 mg Oral BID WC   clopidogrel   75 mg Oral Daily   enoxaparin  (LOVENOX ) injection  40 mg Subcutaneous Q24H   ezetimibe  10 mg Oral Daily   hydrALAZINE   10 mg Intravenous Once   losartan  50 mg Oral Daily   minoxidil   10 mg Oral BID   rosuvastatin   20 mg Oral Daily   Continuous Infusions:  cefTRIAXone (ROCEPHIN)  IV 1 g (07/18/23 1524)   PRN Meds: acetaminophen , hydrALAZINE  **OR** hydrALAZINE , melatonin, polyethylene glycol, prochlorperazine  Time spent: 55 minutes  Author: Althia Atlas. MD [facility] 07/18/2023 3:40 PM  To reach On-call, see care teams to locate the attending and reach out to them via [email or web address]. If 7PM-7AM, please contact night-coverage If you still have difficulty reaching the attending provider, please page the [facility] (Director on Call) for [facility] on amion for assistance.

## 2023-07-19 DIAGNOSIS — R531 Weakness: Secondary | ICD-10-CM | POA: Diagnosis not present

## 2023-07-19 LAB — MAGNESIUM: Magnesium: 2 mg/dL (ref 1.7–2.4)

## 2023-07-19 LAB — BASIC METABOLIC PANEL WITH GFR
Anion gap: 7 (ref 5–15)
BUN: 10 mg/dL (ref 8–23)
CO2: 21 mmol/L — ABNORMAL LOW (ref 22–32)
Calcium: 9.9 mg/dL (ref 8.9–10.3)
Chloride: 113 mmol/L — ABNORMAL HIGH (ref 98–111)
Creatinine, Ser: 0.64 mg/dL (ref 0.44–1.00)
GFR, Estimated: >60 mL/min (ref >60)
Glucose, Bld: 96 mg/dL (ref 70–99)
Potassium: 4.3 mmol/L (ref 3.5–5.1)
Sodium: 141 mmol/L (ref 135–145)

## 2023-07-19 LAB — CBC
HCT: 44.1 % (ref 36.0–46.0)
Hemoglobin: 14.3 g/dL (ref 12.0–15.0)
MCH: 27.7 pg (ref 26.0–34.0)
MCHC: 32.4 g/dL (ref 30.0–36.0)
MCV: 85.3 fL (ref 80.0–100.0)
Platelets: 179 10*3/uL (ref 150–400)
RBC: 5.17 MIL/uL — ABNORMAL HIGH (ref 3.87–5.11)
RDW: 16.2 % — ABNORMAL HIGH (ref 11.5–15.5)
WBC: 4.2 10*3/uL (ref 4.0–10.5)
nRBC: 0 % (ref 0.0–0.2)

## 2023-07-19 LAB — PHOSPHORUS: Phosphorus: 3.4 mg/dL (ref 2.5–4.6)

## 2023-07-19 MED ORDER — LOSARTAN POTASSIUM 50 MG PO TABS: 50.0000 mg | ORAL_TABLET | Freq: Every day | ORAL | 11 refills | Status: AC

## 2023-07-19 NOTE — Discharge Summary (Signed)
 [facility] Discharge Summary   Patient: [name] [name] NUU:[id number]  PCP: Victorio Grave, MD  Date of admission: 07/16/2023   Date of discharge:  07/19/2023     Discharge Diagnoses:  Principal Problem:   Generalized weakness   Admitted From: Home Disposition:  Home with [facility]  Recommendations for Outpatient Follow-up:  Follow-up with PCP in 1 week, BP meds were held during hospital stay due to low blood pressure on arrival, then blood pressure was very high, resumed home medications and started on losartan as well.  Patient was recommended to monitor BP at home and follow-up with PCP to titrate medication accordingly.  Follow parameters for all the BP medications. Follow up LABS/TEST:  As above   Follow-up Information     Carlye Child, MD Follow up in 1 week(s).   Specialty: Internal Medicine Contact information: [phone] [place] [address] [phone]                Diet recommendation: Cardiac diet  Activity: The patient is advised to gradually reintroduce usual activities, as tolerated  Discharge Condition: stable  Code Status: Full code   History of present illness: As per the H and P dictated on admission Hospital Course:    [name] [name] is a 73 y.o. female with medical history significant for severe morbid obesity, OSA intolerant of CPAP, prior CVA with residual right-sided weakness, PAD, left carotid stenosis status post recent left carotid artery stent placement, type 2 diabetes, hypertension, chronic HFpEF, overactive bladder, who presents to the ER due to generalized weakness.  Oral intake has been poor since starting Ozempic over a month ago.  Endorses some dysuria and loose stools 2 days ago.  EMS was activated.  Upon EMS arrival the patient was noted to be hypotensive with SBP's in the 80s, improved with IV fluid bolus.  No reported subjective fevers or chills.    In the ER, had recurrent hypotension with MAP of 59, responded  well to an additional 1 L IV fluid bolus LR.  UA was positive for pyuria.  Urine culture and peripheral blood cultures x 2 were collected.  The patient was started on empiric IV antibiotics Rocephin.  TRH, hospitalist service, was asked to admit.   ED Course: Temperature 98.  BP 133/76, pulse 76, respiration rate 16.  O2 saturation 95% on room air.  Lab studies notable for creatinine 1.16 with GFR 50.     Assessment and Plan:  # Generalized weakness, suspect multifactorial secondary to UTI, hypotension due to dehydration  Generalized weakness resolved, patient is back to her baseline. PT/OT evaluation done, recommended outpatient PT, no OT needs. Fall precautions-ambulates with a cane.   # Presumptive UTI, POA UA positive for pyuria. S/p Rocephin empirically Blood culture NGTD, urine culture growing E. coli sensitive to cephalosporin and Macrobid.  6/1 c/o urinary incontinence, US  renal showed no hydronephrosis, chronic medical renal disease, PVR 25 cc Patient was recommended to follow-up with urology as an outpatient for urodynamic studies Transition to oral antibiotics Keflex  500 mg p.o. 3 times daily for 5 days.   # AKI, prerenal in the setting of dehydration from poor oral intake.  Resolved. Presented with creatinine of 1.16 Baseline creatinine 0.6 with GFR grain 60. S/p IV fluid hydration, AKI resolved.   # Chronic HFpEF and HTN No evidence of volume overload on exam, s/p IV fluid hydration Blood pressure was low on admission, On 6/1 Continue to hold home medications for now On 6/2 hypertensive  urgency, BP was significantly elevated, resumed home dose Coreg  and minoxidil  Patient received IV hydralazine , but blood pressure remained high so started on Losartan 50 mg p.o. daily.  6/3 today blood pressure is stable, patient was discharged on Coreg  25 mg p.o. twice daily, minoxidil  10 mg p.o. twice daily home dose, Aldactone  25 mg p.o. daily home dose. Started losartan 50 mg p.o. daily.   Patient was advised to monitor BP at home and follow with PCP to titrate medications accordingly   History of CVA: Resumed DAPT and statin    Super morbid obesity Body mass index is 46.35 kg/m.  Interventions: Recommend weight loss outpatient Follow-up with primary care provider.  Body mass index is 46.35 kg/m.  Nutrition Interventions:  - Patient was instructed, not to drive, operate heavy machinery, perform activities at heights, swimming or participation in water activities or provide baby sitting services while on Pain, Sleep and Anxiety Medications; until her outpatient Physician has advised to do so again.  - Also recommended to not to take more than prescribed Pain, Sleep and Anxiety Medications.  Patient was seen by physical therapy, who recommended Home health, which was arranged. On the day of the discharge the patient's vitals were stable, and no other acute medical condition were reported by patient. the patient was felt safe to be discharge at Home with Home health.  Consultants: None Procedures: None  Discharge Exam: General: Appear in no distress, no Rash; Oral Mucosa Clear, moist. Cardiovascular: S1 and S2 Present, no Murmur, Respiratory: normal respiratory effort, Bilateral Air entry present and no Crackles, no wheezes Abdomen: Bowel Sound present, Soft and no tenderness, no hernia Extremities: no Pedal edema, no calf tenderness Neurology: alert and oriented to time, place, and person affect appropriate.  Filed Weights   07/16/23 1738  Weight: 122.5 kg   Vitals:   07/19/23 0725 07/19/23 1419  BP: (!) 159/59 (!) 121/49  Pulse: 88 88  Resp: 18   Temp: 97.8 F (36.6 C)   SpO2: 98% 98%    DISCHARGE MEDICATION: Allergies as of 07/19/2023       Reactions   Metoprolol  Tartrate Other (See Comments)   Other reaction(s): kidney pain        Medication List     STOP taking these medications    valsartan  320 MG tablet Commonly known as: DIOVAN         TAKE these medications    albuterol  108 (90 Base) MCG/ACT inhaler Commonly known as: VENTOLIN  HFA Inhale 2 puffs into the lungs every 4 (four) hours as needed for wheezing or shortness of breath.   aspirin  81 MG chewable tablet Chew 1 tablet (81 mg total) by mouth daily.   carvedilol  25 MG tablet Commonly known as: COREG  Take 1 tablet (25 mg total) by mouth 2 (two) times daily with a meal. Hold if SBP <130 mmHg and or HR <65 What changed: additional instructions   cephALEXin  500 MG capsule Commonly known as: KEFLEX  Take 1 capsule (500 mg total) by mouth 3 (three) times daily for 5 days.   clopidogrel  75 MG tablet Commonly known as: PLAVIX  Take 1 tablet (75 mg total) by mouth daily.   diclofenac  75 MG EC tablet Commonly known as: VOLTAREN  Take 75 mg by mouth daily as needed for mild pain or moderate pain.   estradiol 0.1 MG/GM vaginal cream Commonly known as: ESTRACE Place 1 Applicatorful vaginally daily as needed (vaginal burning).   gabapentin  100 MG capsule Commonly known as: NEURONTIN  Take 100 mg  by mouth at bedtime.   Gemtesa 75 MG Tabs Generic drug: Vibegron Take 75 mg by mouth daily.   lidocaine  5 % Commonly known as: LIDODERM  Place 1-2 patches onto the skin daily.   losartan 50 MG tablet Commonly known as: COZAAR Take 1 tablet (50 mg total) by mouth daily. Skip the dose if systolic BP less than 140 mmHg Start taking on: July 20, 2023   minoxidil  10 MG tablet Commonly known as: LONITEN  Take 1 tablet (10 mg total) by mouth 2 (two) times daily. Hold if SBP <150 What changed: additional instructions   mupirocin cream 2 % Commonly known as: BACTROBAN Apply 1 application topically daily as needed (rash under breast).   nystatin powder Commonly known as: MYCOSTATIN/NYSTOP Apply 1 application topically 3 (three) times daily as needed for rash.   Ozempic (0.25 or 0.5 MG/DOSE) 2 MG/3ML Sopn Generic drug: Semaglutide(0.25 or 0.5MG /DOS) Inject 0.25 mg into  the skin once a week. Fridays   Pitavastatin Calcium  1 MG Tabs Take 1 mg by mouth daily.   spironolactone  25 MG tablet Commonly known as: ALDACTONE  Take 1 tablet (25 mg total) by mouth daily. Hold if SBP <130 What changed: additional instructions   triamcinolone  lotion 0.1 % Commonly known as: KENALOG  Apply 1 application topically daily as needed (Reash under breast).   Xiidra 5 % Soln Generic drug: Lifitegrast Place 1 drop into both eyes 2 (two) times daily.       Allergies  Allergen Reactions   Metoprolol  Tartrate Other (See Comments)    Other reaction(s): kidney pain   Discharge Instructions     Call MD for:  difficulty breathing, headache or visual disturbances   Complete by: As directed    Call MD for:  extreme fatigue   Complete by: As directed    Call MD for:  persistant dizziness or light-headedness   Complete by: As directed    Call MD for:  persistant nausea and vomiting   Complete by: As directed    Call MD for:  severe uncontrolled pain   Complete by: As directed    Call MD for:  temperature >100.4   Complete by: As directed    Diet - low sodium heart healthy   Complete by: As directed    Discharge instructions   Complete by: As directed    Follow-up with PCP in 1 week, BP meds were held during hospital stay due to low blood pressure on arrival, then blood pressure was very high, resumed home medications and started on losartan as well.  Patient was recommended to monitor BP at home and follow-up with PCP to titrate medication accordingly.  Follow parameters for all the BP medications.   Increase activity slowly   Complete by: As directed        The results of significant diagnostics from this hospitalization (including imaging, microbiology, ancillary and laboratory) are listed below for reference.    Significant Diagnostic Studies: US  RENAL Result Date: 07/17/2023 CLINICAL DATA:  Incontinence EXAM: RENAL / URINARY TRACT ULTRASOUND COMPLETE  COMPARISON:  None Available. FINDINGS: Right Kidney: Renal measurements: 10.9 x 5.7 x 6.1 cm = volume: 198 mL. There is no hydronephrosis. There is increased echogenicity diffusely. There is a cyst in the mid kidney measuring 1.5 x 1.7 x 1.6 cm. Left Kidney: Renal measurements: 12.6 x 5.3 x 4.7 cm = volume: 165 mL. There is increased echogenicity. There is no hydronephrosis. No focal lesion identified. Bladder: Appears normal for degree of bladder distention. Postvoid  residual of 25 cc. Other: None. IMPRESSION: 1. No hydronephrosis. 2. Increased echogenicity of the kidneys bilaterally consistent with medical renal disease. 3. Small right renal cyst. 4. Postvoid residual of 25 cc. Electronically Signed   By: Tyron Gallon M.D.   On: 07/17/2023 20:21   DG Chest Portable 1 View Result Date: 07/16/2023 CLINICAL DATA:  Cough, near syncope. EXAM: PORTABLE CHEST 1 VIEW COMPARISON:  08/12/2022. FINDINGS: The heart is enlarged the mediastinal contours within normal limits. There is atherosclerotic calcification of the aorta. Lung volumes are low. The pulmonary vasculature is mildly distended. No consolidation, effusion, or pneumothorax is seen. No acute osseous abnormality. IMPRESSION: Cardiomegaly with mildly distended pulmonary vasculature. Electronically Signed   By: Wyvonnia Heimlich M.D.   On: 07/16/2023 18:52    Microbiology: Recent Results (from the past 240 hours)  Urine Culture     Status: Abnormal   Collection Time: 07/16/23  6:10 PM   Specimen: Urine, Random  Result Value Ref Range Status   Specimen Description URINE, RANDOM  Final   Special Requests   Final    NONE Reflexed from [id number] Performed at [facility], [address] [place], [place] [address]    Culture >=100,000 COLONIES/mL ESCHERICHIA COLI (A)  Final   Report Status 07/18/2023 FINAL  Final   Organism ID, Bacteria ESCHERICHIA COLI (A)  Final      Susceptibility   Escherichia coli - MIC*    AMPICILLIN >=32 RESISTANT Resistant      CEFAZOLIN  16 SENSITIVE Sensitive     CEFEPIME <=0.12 SENSITIVE Sensitive     CEFTRIAXONE <=0.25 SENSITIVE Sensitive     CIPROFLOXACIN 0.5 INTERMEDIATE Intermediate     GENTAMICIN  <=1 SENSITIVE Sensitive     IMIPENEM <=0.25 SENSITIVE Sensitive     NITROFURANTOIN <=16 SENSITIVE Sensitive     TRIMETH/SULFA >=320 RESISTANT Resistant     AMPICILLIN/SULBACTAM >=32 RESISTANT Resistant     PIP/TAZO <=4 SENSITIVE Sensitive ug/mL    * >=100,000 COLONIES/mL ESCHERICHIA COLI  Culture, blood (routine x 2)     Status: None (Preliminary result)   Collection Time: 07/16/23 10:30 PM   Specimen: Left; Blood  Result Value Ref Range Status   Specimen Description LEFT WRIST  Final   Special Requests   Final    BOTTLES DRAWN AEROBIC ONLY Blood Culture results may not be optimal due to an inadequate volume of blood received in culture bottles   Culture   Final    NO GROWTH 3 DAYS Performed at [facility], [address] [place], [place] [address]    Report Status PENDING  Incomplete  Culture, blood (routine x 2)     Status: None (Preliminary result)   Collection Time: 07/16/23 10:49 PM   Specimen: BLOOD LEFT ARM  Result Value Ref Range Status   Specimen Description BLOOD LEFT ARM  Final   Special Requests   Final    BOTTLES DRAWN AEROBIC ONLY Blood Culture results may not be optimal due to an inadequate volume of blood received in culture bottles   Culture   Final    NO GROWTH 3 DAYS Performed at [facility], [address] [place], [place] [address]    Report Status PENDING  Incomplete     Labs: CBC: Recent Labs  Lab 07/16/23 1810 07/17/23 0633 07/18/23 0714 07/19/23 0558  WBC 8.0 6.1 3.2* 4.2  NEUTROABS 6.4  --   --   --   HGB 14.9 14.2 14.2 14.3  HCT 46.4* 44.7 44.1 44.1  MCV 85.8 85.8 85.8 85.3  PLT 194 173 201 179   Basic Metabolic Panel: Recent Labs  Lab 07/16/23 1810 07/17/23 0633 07/18/23 0714 07/19/23 0558  NA 139 141 141 141  K 4.5 3.9 3.8 4.3  CL  107 111 110 113*  CO2 24 22 23  21*  GLUCOSE 100* 99 86 96  BUN 20 18 9 10   CREATININE 1.16* 0.74 0.65 0.64  CALCIUM  10.0 9.5 9.6 9.9  MG  --  1.9 1.9 2.0  PHOS  --  3.7 3.1 3.4   Liver Function Tests: Recent Labs  Lab 07/16/23 1810  AST 16  ALT 10  ALKPHOS 46  BILITOT 0.9  PROT 7.2  ALBUMIN  3.9   No results for input(s): "LIPASE", "AMYLASE" in the last 168 hours. No results for input(s): "AMMONIA" in the last 168 hours. Cardiac Enzymes: Recent Labs  Lab 07/17/23 0632  CKTOTAL 143   BNP (last 3 results) No results for input(s): "BNP" in the last 8760 hours. CBG: No results for input(s): "GLUCAP" in the last 168 hours.  Time spent: 35 minutes  Signed:  Althia Atlas  [facility] 07/19/2023 2:26 PM

## 2023-07-19 NOTE — Plan of Care (Signed)

## 2023-07-19 NOTE — Plan of Care (Signed)
?  Problem: Education: ?Goal: Knowledge of General Education information will improve ?Description: Including pain rating scale, medication(s)/side effects and non-pharmacologic comfort measures ?Outcome: Progressing ?  ?Problem: Clinical Measurements: ?Goal: Ability to maintain clinical measurements within normal limits will improve ?Outcome: Progressing ?  ?Problem: Activity: ?Goal: Risk for activity intolerance will decrease ?Outcome: Progressing ?  ?Problem: Nutrition: ?Goal: Adequate nutrition will be maintained ?Outcome: Progressing ?  ?Problem: Elimination: ?Goal: Will not experience complications related to bowel motility ?Outcome: Progressing ?  ?Problem: Safety: ?Goal: Ability to remain free from injury will improve ?Outcome: Progressing ?  ?

## 2023-07-19 NOTE — Care Management Important Message (Signed)
 Important Message  Patient Details  Name: [name] [name] MRN: [id number] Date of Birth: [birth year]   Important Message Given:  Yes - Medicare IM     Felix Host 07/19/2023, 1:34 PM

## 2023-07-19 NOTE — Progress Notes (Signed)
 Physical Therapy Treatment Patient Details Name: [name] [name] MRN: [id number] DOB: 1950-07-10 Today's Date: 07/19/2023   History of Present Illness [name] [name] is a 73 y.o. female who presents to the ER due to generalized weakness; with medical history significant for severe morbid obesity, OSA intolerant of CPAP, prior CVA with residual right-sided weakness, PAD, left carotid stenosis status post recent left carotid artery stent placement, type 2 diabetes, hypertension, chronic HFpEF, overactive bladder,    PT Comments  Pt received in supine and agreeable to session with encouragement. Pt able to complete bed mobility, STS, and short ambulation distance with up to CGA for safety. Pt declines use of RW and is able to ambulate without AD, however demonstrates slightly increased instability. Pt reports no concerns about returning home. Pt left in bathroom with NT to complete hygiene tasks. Pt continues to benefit from PT services to progress toward functional mobility goals.     If plan is discharge home, recommend the following: A little help with bathing/dressing/bathroom;Assistance with cooking/housework   Can travel by private vehicle        Equipment Recommendations  None recommended by PT    Recommendations for Other Services       Precautions / Restrictions Precautions Precautions: Fall Recall of Precautions/Restrictions: Intact Restrictions Weight Bearing Restrictions Per Provider Order: No     Mobility  Bed Mobility Overal bed mobility: Needs Assistance Bed Mobility: Supine to Sit     Supine to sit: Supervision, HOB elevated          Transfers Overall transfer level: Needs assistance Equipment used: None Transfers: Sit to/from Stand Sit to Stand: Supervision           General transfer comment: From EOB and recliner with BUE support for power up    Ambulation/Gait Ambulation/Gait assistance: Contact guard assist Gait Distance (Feet): 15  Feet Assistive device: None Gait Pattern/deviations: Step-through pattern, Decreased stride length       General Gait Details: slightly unsteady without UE support, but no LOB. CGA for safety   Stairs             Wheelchair Mobility     Tilt Bed    Modified Rankin (Stroke Patients Only)       Balance Overall balance assessment: Needs assistance Sitting-balance support: Feet supported, No upper extremity supported Sitting balance-Leahy Scale: Good     Standing balance support: No upper extremity supported, During functional activity Standing balance-Leahy Scale: Fair                              Hotel manager: Impaired Factors Affecting Communication: Difficulty expressing self  Cognition Arousal: Alert Behavior During Therapy: WFL for tasks assessed/performed   PT - Cognitive impairments: No apparent impairments                         Following commands: Intact      Cueing Cueing Techniques: Verbal cues  Exercises      General Comments        Pertinent Vitals/Pain Pain Assessment Pain Assessment: Faces Faces Pain Scale: Hurts a little bit Pain Location: B knees (R>L) Pain Descriptors / Indicators: Tightness, Sore Pain Intervention(s): Limited activity within patient's tolerance, Monitored during session     PT Goals (current goals can now be found in the care plan section) Acute Rehab PT Goals Patient Stated Goal: get home soon PT Goal Formulation: With patient  Time For Goal Achievement: 07/31/23 Progress towards PT goals: Progressing toward goals    Frequency    Min 2X/week       AM-PAC PT "6 Clicks" Mobility   Outcome Measure  Help needed turning from your back to your side while in a flat bed without using bedrails?: A Little Help needed moving from lying on your back to sitting on the side of a flat bed without using bedrails?: A Little Help needed moving to and from a bed to  a chair (including a wheelchair)?: A Little Help needed standing up from a chair using your arms (e.g., wheelchair or bedside chair)?: A Little Help needed to walk in hospital room?: A Little Help needed climbing 3-5 steps with a railing? : A Little 6 Click Score: 18    End of Session   Activity Tolerance: Patient tolerated treatment well Patient left: with nursing/sitter in room;Other (comment) (in bathroom with NT) Nurse Communication: Mobility status PT Visit Diagnosis: Unsteadiness on feet (R26.81)     Time: 1610-9604 PT Time Calculation (min) (ACUTE ONLY): 19 min  Charges:    $Gait Training: 8-22 mins PT General Charges $$ ACUTE PT VISIT: 1 Visit                     Michaelle Adolphus, PTA Acute Rehabilitation Services Secure Chat Preferred  [phone]    Michaelle Adolphus 07/19/2023, 9:57 AM

## 2023-07-20 DIAGNOSIS — L659 Nonscarring hair loss, unspecified: Secondary | ICD-10-CM | POA: Diagnosis not present

## 2023-07-21 DIAGNOSIS — I6522 Occlusion and stenosis of left carotid artery: Secondary | ICD-10-CM | POA: Diagnosis not present

## 2023-07-21 DIAGNOSIS — Z95828 Presence of other vascular implants and grafts: Secondary | ICD-10-CM | POA: Diagnosis not present

## 2023-07-21 DIAGNOSIS — I700 Atherosclerosis of aorta: Secondary | ICD-10-CM | POA: Diagnosis not present

## 2023-07-21 DIAGNOSIS — N3090 Cystitis, unspecified without hematuria: Secondary | ICD-10-CM | POA: Diagnosis not present

## 2023-07-21 DIAGNOSIS — M5136 Other intervertebral disc degeneration, lumbar region with discogenic back pain only: Secondary | ICD-10-CM | POA: Diagnosis not present

## 2023-07-21 DIAGNOSIS — I10 Essential (primary) hypertension: Secondary | ICD-10-CM | POA: Diagnosis not present

## 2023-07-21 DIAGNOSIS — I5032 Chronic diastolic (congestive) heart failure: Secondary | ICD-10-CM | POA: Diagnosis not present

## 2023-07-21 DIAGNOSIS — Z8673 Personal history of transient ischemic attack (TIA), and cerebral infarction without residual deficits: Secondary | ICD-10-CM | POA: Diagnosis not present

## 2023-07-21 DIAGNOSIS — E119 Type 2 diabetes mellitus without complications: Secondary | ICD-10-CM | POA: Diagnosis not present

## 2023-07-21 DIAGNOSIS — E782 Mixed hyperlipidemia: Secondary | ICD-10-CM | POA: Diagnosis not present

## 2023-07-21 LAB — CULTURE, BLOOD (ROUTINE X 2)
Culture: NO GROWTH
Culture: NO GROWTH

## 2023-07-26 DIAGNOSIS — H40023 Open angle with borderline findings, high risk, bilateral: Secondary | ICD-10-CM | POA: Diagnosis not present

## 2023-07-29 DIAGNOSIS — I890 Lymphedema, not elsewhere classified: Secondary | ICD-10-CM | POA: Diagnosis not present

## 2023-08-09 DIAGNOSIS — Z7902 Long term (current) use of antithrombotics/antiplatelets: Secondary | ICD-10-CM | POA: Diagnosis not present

## 2023-08-09 DIAGNOSIS — I6522 Occlusion and stenosis of left carotid artery: Secondary | ICD-10-CM | POA: Diagnosis not present

## 2023-08-09 DIAGNOSIS — Z8673 Personal history of transient ischemic attack (TIA), and cerebral infarction without residual deficits: Secondary | ICD-10-CM | POA: Diagnosis not present

## 2023-08-09 DIAGNOSIS — T82855A Stenosis of coronary artery stent, initial encounter: Secondary | ICD-10-CM | POA: Diagnosis not present

## 2023-08-09 DIAGNOSIS — I6523 Occlusion and stenosis of bilateral carotid arteries: Secondary | ICD-10-CM | POA: Diagnosis not present

## 2023-08-09 DIAGNOSIS — Z7982 Long term (current) use of aspirin: Secondary | ICD-10-CM | POA: Diagnosis not present

## 2023-08-11 DIAGNOSIS — I890 Lymphedema, not elsewhere classified: Secondary | ICD-10-CM | POA: Diagnosis not present

## 2023-08-11 DIAGNOSIS — R42 Dizziness and giddiness: Secondary | ICD-10-CM | POA: Diagnosis not present

## 2023-08-11 DIAGNOSIS — I959 Hypotension, unspecified: Secondary | ICD-10-CM | POA: Diagnosis not present

## 2023-08-12 DIAGNOSIS — M25511 Pain in right shoulder: Secondary | ICD-10-CM | POA: Diagnosis not present

## 2023-08-12 DIAGNOSIS — Z79899 Other long term (current) drug therapy: Secondary | ICD-10-CM | POA: Diagnosis not present

## 2023-08-12 DIAGNOSIS — G894 Chronic pain syndrome: Secondary | ICD-10-CM | POA: Diagnosis not present

## 2023-08-13 ENCOUNTER — Encounter (HOSPITAL_COMMUNITY): Payer: Self-pay | Admitting: Interventional Radiology

## 2023-08-17 ENCOUNTER — Ambulatory Visit: Admitting: Podiatry

## 2023-08-23 ENCOUNTER — Other Ambulatory Visit: Payer: Self-pay | Admitting: Family Medicine

## 2023-08-23 DIAGNOSIS — R911 Solitary pulmonary nodule: Secondary | ICD-10-CM

## 2023-08-24 DIAGNOSIS — I890 Lymphedema, not elsewhere classified: Secondary | ICD-10-CM | POA: Diagnosis not present

## 2023-08-24 DIAGNOSIS — G8929 Other chronic pain: Secondary | ICD-10-CM | POA: Diagnosis not present

## 2023-08-24 DIAGNOSIS — M5136 Other intervertebral disc degeneration, lumbar region with discogenic back pain only: Secondary | ICD-10-CM | POA: Diagnosis not present

## 2023-08-24 DIAGNOSIS — Z8673 Personal history of transient ischemic attack (TIA), and cerebral infarction without residual deficits: Secondary | ICD-10-CM | POA: Diagnosis not present

## 2023-08-24 DIAGNOSIS — M79651 Pain in right thigh: Secondary | ICD-10-CM | POA: Diagnosis not present

## 2023-08-24 DIAGNOSIS — I10 Essential (primary) hypertension: Secondary | ICD-10-CM | POA: Diagnosis not present

## 2023-08-25 ENCOUNTER — Other Ambulatory Visit: Payer: Self-pay | Admitting: Family Medicine

## 2023-08-25 ENCOUNTER — Ambulatory Visit
Admission: RE | Admit: 2023-08-25 | Discharge: 2023-08-25 | Disposition: A | Discharge status: Home / Self Care | Source: Ambulatory Visit | Attending: Family Medicine | Admitting: Family Medicine

## 2023-08-25 ENCOUNTER — Other Ambulatory Visit

## 2023-08-25 DIAGNOSIS — J439 Emphysema, unspecified: Secondary | ICD-10-CM | POA: Diagnosis not present

## 2023-08-25 DIAGNOSIS — R918 Other nonspecific abnormal finding of lung field: Secondary | ICD-10-CM | POA: Diagnosis not present

## 2023-08-25 DIAGNOSIS — E041 Nontoxic single thyroid nodule: Secondary | ICD-10-CM | POA: Diagnosis not present

## 2023-08-25 DIAGNOSIS — M79651 Pain in right thigh: Secondary | ICD-10-CM

## 2023-08-25 DIAGNOSIS — R911 Solitary pulmonary nodule: Secondary | ICD-10-CM

## 2023-09-02 ENCOUNTER — Telehealth: Payer: Self-pay | Admitting: *Deleted

## 2023-09-02 NOTE — Telephone Encounter (Signed)
 Pt called in c/o Right heel pain, especially when she is driving and using her foot on the pedals.  She states her RLE numbness and tingling is better.  She is taking her gabapentin  tid. I informed her to elevate her right foot, try a foot soak and maybe look into getting some better shoe inserts/soles. Please advise what else she can do.

## 2023-09-06 NOTE — Telephone Encounter (Signed)
 Pt informed of below.     Arvell Evalene SAUNDERS, DO to Me (Selected Message)    09/02/23  2:23 PM That is perfect.  She can also try icing her heel 3 times daily for 10 minutes at a time and she can purchase some heel cups to put in her shoes.

## 2023-09-13 ENCOUNTER — Other Ambulatory Visit: Payer: Self-pay | Admitting: Family Medicine

## 2023-09-13 DIAGNOSIS — E041 Nontoxic single thyroid nodule: Secondary | ICD-10-CM

## 2023-09-14 ENCOUNTER — Ambulatory Visit
Admission: RE | Admit: 2023-09-14 | Discharge: 2023-09-14 | Disposition: A | Discharge status: Home / Self Care | Source: Ambulatory Visit | Attending: Family Medicine | Admitting: Family Medicine

## 2023-09-14 DIAGNOSIS — E042 Nontoxic multinodular goiter: Secondary | ICD-10-CM | POA: Diagnosis not present

## 2023-09-14 DIAGNOSIS — E041 Nontoxic single thyroid nodule: Secondary | ICD-10-CM

## 2023-09-21 DIAGNOSIS — M79672 Pain in left foot: Secondary | ICD-10-CM | POA: Diagnosis not present

## 2023-09-21 DIAGNOSIS — I10 Essential (primary) hypertension: Secondary | ICD-10-CM | POA: Diagnosis not present

## 2023-09-21 DIAGNOSIS — M79671 Pain in right foot: Secondary | ICD-10-CM | POA: Diagnosis not present

## 2023-09-21 DIAGNOSIS — T148XXA Other injury of unspecified body region, initial encounter: Secondary | ICD-10-CM | POA: Diagnosis not present

## 2023-09-21 DIAGNOSIS — E119 Type 2 diabetes mellitus without complications: Secondary | ICD-10-CM | POA: Diagnosis not present

## 2023-10-04 NOTE — Progress Notes (Deleted)
    [name] [address] [place] [address] Phone: [phone]   Assessment and Plan:     There are no diagnoses linked to this encounter.  ***   Pertinent previous records reviewed include ***    Follow Up: ***     Subjective:    Chief Complaint: Bilateral foot pain  HPI:   10/05/23 Patient is a 73 year old female with a complaint of bilateral foot pain. Patient states   Relevant Historical Information: ***  Additional pertinent review of systems negative.   Current Outpatient Medications:    albuterol  (VENTOLIN  HFA) 108 (90 Base) MCG/ACT inhaler, Inhale 2 puffs into the lungs every 4 (four) hours as needed for wheezing or shortness of breath., Disp: , Rfl:    aspirin  81 MG chewable tablet, Chew 1 tablet (81 mg total) by mouth daily., Disp: 21 tablet, Rfl: 0   carvedilol  (COREG ) 25 MG tablet, Take 1 tablet (25 mg total) by mouth 2 (two) times daily with a meal. Hold if SBP <130 mmHg and or HR <65, Disp: , Rfl:    clopidogrel  (PLAVIX ) 75 MG tablet, Take 1 tablet (75 mg total) by mouth daily., Disp: 30 tablet, Rfl: 0   diclofenac  (VOLTAREN ) 75 MG EC tablet, Take 75 mg by mouth daily as needed for mild pain or moderate pain., Disp: , Rfl:    estradiol (ESTRACE) 0.1 MG/GM vaginal cream, Place 1 Applicatorful vaginally daily as needed (vaginal burning)., Disp: , Rfl:    gabapentin  (NEURONTIN ) 100 MG capsule, Take 100 mg by mouth at bedtime., Disp: , Rfl:    GEMTESA 75 MG TABS, Take 75 mg by mouth daily., Disp: , Rfl:    lidocaine  (LIDODERM ) 5 %, Place 1-2 patches onto the skin daily., Disp: , Rfl:    Lifitegrast (XIIDRA) 5 % SOLN, Place 1 drop into both eyes 2 (two) times daily., Disp: , Rfl:    losartan  (COZAAR ) 50 MG tablet, Take 1 tablet (50 mg total) by mouth daily. Skip the dose if systolic BP less than 140 mmHg, Disp: 30 tablet, Rfl: 11   minoxidil  (LONITEN ) 10 MG tablet, Take 1 tablet (10 mg total) by mouth 2  (two) times daily. Hold if SBP <150, Disp: , Rfl:    mupirocin cream (BACTROBAN) 2 %, Apply 1 application topically daily as needed (rash under breast)., Disp: , Rfl:    nystatin (MYCOSTATIN/NYSTOP) powder, Apply 1 application topically 3 (three) times daily as needed for rash., Disp: , Rfl:    OZEMPIC, 0.25 OR 0.5 MG/DOSE, 2 MG/3ML SOPN, Inject 0.25 mg into the skin once a week. Fridays, Disp: , Rfl:    Pitavastatin Calcium  1 MG TABS, Take 1 mg by mouth daily., Disp: , Rfl:    spironolactone  (ALDACTONE ) 25 MG tablet, Take 1 tablet (25 mg total) by mouth daily. Hold if SBP <130, Disp: , Rfl:    triamcinolone  lotion (KENALOG ) 0.1 %, Apply 1 application topically daily as needed (Reash under breast)., Disp: , Rfl:    Objective:     There were no vitals filed for this visit.    There is no height or weight on file to calculate BMI.    Physical Exam:    ***   Electronically signed by:  Odis Mace D.CLEMENTEEN [name] 3:26 PM 10/04/23

## 2023-10-05 ENCOUNTER — Ambulatory Visit: Admitting: Sports Medicine

## 2023-10-11 DIAGNOSIS — H5213 Myopia, bilateral: Secondary | ICD-10-CM | POA: Diagnosis not present

## 2023-10-11 NOTE — Progress Notes (Unsigned)
 I, Claretha Schimke am a scribe for Dr. Artist Lloyd, MD.  [name] [name] is a 73 y.o. female who presents to [facility] at [facility] today for bilat foot pain x for about a month now. It feels like she is walking on balls and it is sticking in the heels. Feet swell. Tried buying multiple slippers. There is a callus under her right foot. When it starts hurting it gets numb. Pt locates pain to inside the foot near the ankle bone. Patient is wanting to know if there is something that can be done about her legs. Feels like the fluid in the legs switches around when she moves.   Additionally she notes chronic low back pain.  She has not had much treatment for this recently.  Aggravates: walking Treatments tried:  Dx imaging: 10/13/22 R foot & ankle XR  Pertinent review of systems: No fever or chills  Relevant historical information: Previously seen and treated for lymphedema bilateral lower extremities with custom compression stockings.  She does wear them sometimes.   Exam:  BP (!) 140/60   Pulse 85   Ht 5' 4 (1.626 m)   SpO2 95%   BMI 46.35 kg/m  General: Well Developed, well nourished, and in no acute distress.   MSK: Bilateral lower extremity significant lymphedema without erythema or drainage.  Ankles bilaterally decreased range of motion mildly tender to palpation.  Right foot: Tender palpation plantar calcaneus. Tender palpable callus plantar fifth metatarsal head.  L-spine decreased range of motion.  Lab and Radiology Results  X-ray images lumbar spine and bilateral ankles obtained today personally and independently interpreted.  Lumbar spine: Multilevel degenerative changes.  No acute fractures.  Aortic atherosclerosis is present.  Right ankle: Significant soft tissue swelling.  Mild degenerative changes.  No acute fractures are visible.  Left ankle: May have again soft tissue swelling.  Mild degenerative changes.  No acute fractures are visible.   Significant plantar and posterior calcaneal spur visible.  Await formal radiology review     Assessment and Plan: 73 y.o. female with bilateral lower extremity pain and discomfort predominantly due to lymphedema.  She has previously been seen and treated for this with physical therapy dedicated for lymphedema.  She does have custom straps and stockings which are going to be essential to help improve her discomfort from her lymphedema.  Recommend using those every morning.  She does have somebody to help her put them on in the morning.  Right heel pain due predominantly to plantar fasciitis.  Plan for home exercise program working on eccentric exercises using elastic band.  Recheck in 1 month if not improved consider steroid injection.  Painful callus right fifth metatarsal plantar aspect: Plan to refer to podiatry consider shaving that down.  Chronic low back pain: Degenerative changes and muscle dysfunction.  Consider PT at recheck.   PDMP not reviewed this encounter. Orders Placed This Encounter  Procedures   DG Ankle Complete Right    Standing Status:   Future    Number of Occurrences:   1    Expiration Date:   10/11/2024    Reason for Exam (SYMPTOM  OR DIAGNOSIS REQUIRED):   ankle pain    Preferred imaging location?:   [facility] Ankle Complete Left    Standing Status:   Future    Number of Occurrences:   1    Expiration Date:   10/11/2024    Reason for Exam (SYMPTOM  OR DIAGNOSIS  REQUIRED):   ankle pain    Preferred imaging location?:   [place] [facility]   DG Lumbar Spine 2-3 Views    Standing Status:   Future    Number of Occurrences:   1    Expiration Date:   10/11/2024    Reason for Exam (SYMPTOM  OR DIAGNOSIS REQUIRED):   back pain    Preferred imaging location?:   [place] [facility]   Ambulatory referral to Podiatry    Referral Priority:   Routine    Referral Type:   Consultation    Referral Reason:   Specialty Services Required    Requested  Specialty:   Podiatry    Number of Visits Requested:   1   No orders of the defined types were placed in this encounter.    Discussed warning signs or symptoms. Please see discharge instructions. Patient expresses understanding.   The above documentation has been reviewed and is accurate and complete Artist Lloyd, M.D.

## 2023-10-12 ENCOUNTER — Ambulatory Visit

## 2023-10-12 ENCOUNTER — Other Ambulatory Visit: Payer: Self-pay

## 2023-10-12 ENCOUNTER — Ambulatory Visit (INDEPENDENT_AMBULATORY_CARE_PROVIDER_SITE_OTHER): Admitting: Family Medicine

## 2023-10-12 ENCOUNTER — Telehealth: Payer: Self-pay | Admitting: Family Medicine

## 2023-10-12 VITALS — BP 140/60 | HR 85 | Ht 64.0 in

## 2023-10-12 DIAGNOSIS — R600 Localized edema: Secondary | ICD-10-CM | POA: Diagnosis not present

## 2023-10-12 DIAGNOSIS — M79672 Pain in left foot: Secondary | ICD-10-CM

## 2023-10-12 DIAGNOSIS — M79671 Pain in right foot: Secondary | ICD-10-CM | POA: Diagnosis not present

## 2023-10-12 DIAGNOSIS — M19072 Primary osteoarthritis, left ankle and foot: Secondary | ICD-10-CM | POA: Diagnosis not present

## 2023-10-12 DIAGNOSIS — M545 Low back pain, unspecified: Secondary | ICD-10-CM

## 2023-10-12 DIAGNOSIS — M25571 Pain in right ankle and joints of right foot: Secondary | ICD-10-CM | POA: Diagnosis not present

## 2023-10-12 DIAGNOSIS — L84 Corns and callosities: Secondary | ICD-10-CM | POA: Diagnosis not present

## 2023-10-12 DIAGNOSIS — M25572 Pain in left ankle and joints of left foot: Secondary | ICD-10-CM | POA: Diagnosis not present

## 2023-10-12 DIAGNOSIS — M19071 Primary osteoarthritis, right ankle and foot: Secondary | ICD-10-CM | POA: Diagnosis not present

## 2023-10-12 DIAGNOSIS — M7989 Other specified soft tissue disorders: Secondary | ICD-10-CM | POA: Diagnosis not present

## 2023-10-12 NOTE — Telephone Encounter (Signed)
 Patient called asking if there is a medication that Dr Joane would recommend to help with her pain in addition to the exercises that were given today.   Please advise.

## 2023-10-12 NOTE — Patient Instructions (Addendum)
 Thank you for coming in today. Refer to pediatry for callus on foot. Xray of both ankles and low back Plantar Factitias HEP. Use compression stockings daily. Check back in a month.

## 2023-10-14 NOTE — Telephone Encounter (Signed)
 There are not great medication options.  If her foot is very bothersome I can proceed with a cortisone shot sooner than 1 month if needed.

## 2023-10-18 NOTE — Telephone Encounter (Signed)
 Called pt at [phone], no answer, no VM.

## 2023-10-20 NOTE — Telephone Encounter (Signed)
 Called pt at [phone], VM full.

## 2023-10-24 ENCOUNTER — Ambulatory Visit: Payer: Self-pay | Admitting: Family Medicine

## 2023-10-24 NOTE — Progress Notes (Signed)
 Right ankle x-ray shows lower leg swelling.  You do have arthritis multiple locations in the ankle, midfoot, and hindfoot.

## 2023-10-24 NOTE — Progress Notes (Signed)
 Left ankle x-ray shows arthritis and leg swelling.

## 2023-10-24 NOTE — Progress Notes (Signed)
 Lumbar spine x-ray shows multilevel arthritis severe in places.

## 2023-10-25 DIAGNOSIS — E782 Mixed hyperlipidemia: Secondary | ICD-10-CM | POA: Diagnosis not present

## 2023-10-25 DIAGNOSIS — I700 Atherosclerosis of aorta: Secondary | ICD-10-CM | POA: Diagnosis not present

## 2023-10-25 DIAGNOSIS — Z8673 Personal history of transient ischemic attack (TIA), and cerebral infarction without residual deficits: Secondary | ICD-10-CM | POA: Diagnosis not present

## 2023-10-25 DIAGNOSIS — E119 Type 2 diabetes mellitus without complications: Secondary | ICD-10-CM | POA: Diagnosis not present

## 2023-10-25 DIAGNOSIS — M47816 Spondylosis without myelopathy or radiculopathy, lumbar region: Secondary | ICD-10-CM | POA: Diagnosis not present

## 2023-10-25 DIAGNOSIS — I890 Lymphedema, not elsewhere classified: Secondary | ICD-10-CM | POA: Diagnosis not present

## 2023-10-25 DIAGNOSIS — Z8679 Personal history of other diseases of the circulatory system: Secondary | ICD-10-CM | POA: Diagnosis not present

## 2023-10-25 DIAGNOSIS — I10 Essential (primary) hypertension: Secondary | ICD-10-CM | POA: Diagnosis not present

## 2023-10-25 DIAGNOSIS — M5136 Other intervertebral disc degeneration, lumbar region with discogenic back pain only: Secondary | ICD-10-CM | POA: Diagnosis not present

## 2023-10-25 DIAGNOSIS — I5032 Chronic diastolic (congestive) heart failure: Secondary | ICD-10-CM | POA: Diagnosis not present

## 2023-10-26 ENCOUNTER — Other Ambulatory Visit: Payer: Self-pay | Admitting: Gastroenterology

## 2023-10-26 DIAGNOSIS — K7460 Unspecified cirrhosis of liver: Secondary | ICD-10-CM | POA: Diagnosis not present

## 2023-10-26 NOTE — Telephone Encounter (Signed)
 Unable to reach pt by phone, will discuss at upcoming visit 11/16/23.

## 2023-10-31 ENCOUNTER — Other Ambulatory Visit

## 2023-10-31 ENCOUNTER — Ambulatory Visit
Admission: RE | Admit: 2023-10-31 | Discharge: 2023-10-31 | Disposition: A | Discharge status: Home / Self Care | Source: Ambulatory Visit | Attending: Gastroenterology | Admitting: Gastroenterology

## 2023-10-31 DIAGNOSIS — K7460 Unspecified cirrhosis of liver: Secondary | ICD-10-CM

## 2023-11-09 ENCOUNTER — Ambulatory Visit (INDEPENDENT_AMBULATORY_CARE_PROVIDER_SITE_OTHER): Admitting: Podiatry

## 2023-11-09 ENCOUNTER — Encounter: Payer: Self-pay | Admitting: Podiatry

## 2023-11-09 DIAGNOSIS — M722 Plantar fascial fibromatosis: Secondary | ICD-10-CM

## 2023-11-09 DIAGNOSIS — Q828 Other specified congenital malformations of skin: Secondary | ICD-10-CM

## 2023-11-09 DIAGNOSIS — D689 Coagulation defect, unspecified: Secondary | ICD-10-CM | POA: Diagnosis not present

## 2023-11-09 MED ORDER — TRIAMCINOLONE ACETONIDE 10 MG/ML IJ SUSP
10.0000 mg | Freq: Once | INTRAMUSCULAR | Status: AC
  Administered 2023-11-09: 10 mg via INTRA_ARTICULAR

## 2023-11-09 NOTE — Progress Notes (Signed)
 Subjective:   Patient ID: [name] [name], female   DOB: 73 y.o.   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

## 2023-11-11 DIAGNOSIS — G8929 Other chronic pain: Secondary | ICD-10-CM | POA: Diagnosis not present

## 2023-11-11 DIAGNOSIS — Z96653 Presence of artificial knee joint, bilateral: Secondary | ICD-10-CM | POA: Diagnosis not present

## 2023-11-11 DIAGNOSIS — T8484XD Pain due to internal orthopedic prosthetic devices, implants and grafts, subsequent encounter: Secondary | ICD-10-CM | POA: Diagnosis not present

## 2023-11-11 DIAGNOSIS — M545 Low back pain, unspecified: Secondary | ICD-10-CM | POA: Diagnosis not present

## 2023-11-16 ENCOUNTER — Ambulatory Visit: Admitting: Family Medicine

## 2023-11-16 VITALS — BP 138/60 | HR 85 | Ht 64.0 in

## 2023-11-16 DIAGNOSIS — I890 Lymphedema, not elsewhere classified: Secondary | ICD-10-CM

## 2023-11-16 DIAGNOSIS — M47816 Spondylosis without myelopathy or radiculopathy, lumbar region: Secondary | ICD-10-CM | POA: Diagnosis not present

## 2023-11-16 DIAGNOSIS — M545 Low back pain, unspecified: Secondary | ICD-10-CM | POA: Diagnosis not present

## 2023-11-16 DIAGNOSIS — G8929 Other chronic pain: Secondary | ICD-10-CM | POA: Diagnosis not present

## 2023-11-16 NOTE — Patient Instructions (Addendum)
 Thank you for coming in today.   I've referred you to Aquatic Therapy.  Let us  know if you don't hear from them in one week.   Recommend compression stockings  Check back in 3 months

## 2023-11-16 NOTE — Progress Notes (Signed)
   LILLETTE Ileana Collet, PhD, LAT, ATC acting as a scribe for Artist Lloyd, MD.  [name] [name] is a 73 y.o. female who presents to [facility] at [facility] today for 3-month f/u low back and bilat LE pain. Pt was last seen by Dr. Lloyd on 10/12/23 and was advised to use her custom stockings daily. Also taught HEP, referred to podiatry.  Today, pt reports she has been wearing her compression stockings, but has a terrible time putting them on. She states that she did not seen any difference in her LE swelling. Her LBP is the same, locates pain to both sides of her low back. She will experience super painful sharp pains.   She has already seen pain management and had multiple back injections and no longer wants any further back injections.  She has not been to physical therapy for the back pain anytime recently.  Dx imaging: 10/12/23 L-spine, R & L ankle XR 10/13/22 R foot & ankle XR   Pertinent review of systems: No fevers or chills  Relevant historical information: History of a stroke.  Lymphedema bilateral lower extremities.   Exam:  BP 138/60   Pulse 85   Ht 5' 4 (1.626 m)   SpO2 98%   BMI 46.35 kg/m  General: Well Developed, well nourished, and in no acute distress.   MSK: Spine decreased lumbar motion.  Lower extremity lymphedema is present bilateral lower extremities.     Assessment and Plan: 73 y.o. female with chronic low back pain primarily due to facet degeneration and overall degenerative changes lumbar spine along with muscle weakness and spasm.  She is already had good conservative management trials including multiple different back procedures which have not helped.  The next logical step could be aquatic physical therapy.  Plan to refer to aquatic physical therapy.  Additionally she has continued bothersome lymphedema.  She already has custom compression stockings.  I stressed the importance of using the stockings.  Recheck in 79-month PDMP not  reviewed this encounter. Orders Placed This Encounter  Procedures   Ambulatory referral to Physical Therapy    Referral Priority:   Routine    Referral Type:   Physical Medicine    Referral Reason:   Specialty Services Required    Requested Specialty:   Physical Therapy    Number of Visits Requested:   1   No orders of the defined types were placed in this encounter.    Discussed warning signs or symptoms. Please see discharge instructions. Patient expresses understanding.   The above documentation has been reviewed and is accurate and complete Artist Lloyd, M.D.

## 2023-11-18 DIAGNOSIS — I110 Hypertensive heart disease with heart failure: Secondary | ICD-10-CM | POA: Diagnosis not present

## 2023-11-18 DIAGNOSIS — I2510 Atherosclerotic heart disease of native coronary artery without angina pectoris: Secondary | ICD-10-CM | POA: Diagnosis not present

## 2023-11-18 DIAGNOSIS — R002 Palpitations: Secondary | ICD-10-CM | POA: Diagnosis not present

## 2023-11-18 DIAGNOSIS — R0609 Other forms of dyspnea: Secondary | ICD-10-CM | POA: Diagnosis not present

## 2023-11-18 DIAGNOSIS — Z8673 Personal history of transient ischemic attack (TIA), and cerebral infarction without residual deficits: Secondary | ICD-10-CM | POA: Diagnosis not present

## 2023-11-18 DIAGNOSIS — Z13220 Encounter for screening for lipoid disorders: Secondary | ICD-10-CM | POA: Diagnosis not present

## 2023-11-18 DIAGNOSIS — I5032 Chronic diastolic (congestive) heart failure: Secondary | ICD-10-CM | POA: Diagnosis not present

## 2023-11-18 DIAGNOSIS — I10 Essential (primary) hypertension: Secondary | ICD-10-CM | POA: Diagnosis not present

## 2023-11-18 DIAGNOSIS — R600 Localized edema: Secondary | ICD-10-CM | POA: Diagnosis not present

## 2023-11-18 DIAGNOSIS — E785 Hyperlipidemia, unspecified: Secondary | ICD-10-CM | POA: Diagnosis not present

## 2023-11-21 DIAGNOSIS — R002 Palpitations: Secondary | ICD-10-CM | POA: Diagnosis not present

## 2023-11-24 ENCOUNTER — Telehealth: Payer: Self-pay | Admitting: Family Medicine

## 2023-11-24 NOTE — Telephone Encounter (Signed)
 Patient called and said she woke up and her right knee is swollen and stiff. She just needs to know what to do. Please advise.

## 2023-11-24 NOTE — Telephone Encounter (Signed)
 Pt seen 11/16/23 for LBP. Forwarding to Dr. Joane to review and advise.   Likely needs OV for evaluation.

## 2023-11-25 NOTE — Telephone Encounter (Signed)
 It looks like an office visit is probably needed.  In the meantime Tylenol  and topical Voltaren  gel could be helpful.

## 2023-11-25 NOTE — Telephone Encounter (Signed)
 Called pt and advised per Dr. Joane. Pt verbalized understanding. Appointment scheduled for 11/29/23.   Pt voices concerns about swelling and increased warmth in the knee. Denies erythema. Has concerns about recurring infection.

## 2023-11-25 NOTE — Telephone Encounter (Signed)
 Will take a look at it on Tuesday.  Sounds like we need to do an aspiration and culture.

## 2023-11-29 ENCOUNTER — Ambulatory Visit: Payer: Self-pay | Admitting: Family Medicine

## 2023-11-29 ENCOUNTER — Ambulatory Visit (INDEPENDENT_AMBULATORY_CARE_PROVIDER_SITE_OTHER): Admitting: Family Medicine

## 2023-11-29 ENCOUNTER — Encounter: Payer: Self-pay | Admitting: Family Medicine

## 2023-11-29 ENCOUNTER — Ambulatory Visit

## 2023-11-29 ENCOUNTER — Other Ambulatory Visit: Payer: Self-pay

## 2023-11-29 VITALS — BP 136/76 | HR 77 | Ht 64.0 in

## 2023-11-29 DIAGNOSIS — M170 Bilateral primary osteoarthritis of knee: Secondary | ICD-10-CM

## 2023-11-29 DIAGNOSIS — Z471 Aftercare following joint replacement surgery: Secondary | ICD-10-CM | POA: Diagnosis not present

## 2023-11-29 DIAGNOSIS — G8929 Other chronic pain: Secondary | ICD-10-CM

## 2023-11-29 DIAGNOSIS — M25561 Pain in right knee: Secondary | ICD-10-CM

## 2023-11-29 DIAGNOSIS — Z96653 Presence of artificial knee joint, bilateral: Secondary | ICD-10-CM | POA: Diagnosis not present

## 2023-11-29 NOTE — Progress Notes (Signed)
 Left knee x-ray shows prior knee replacement.  No evidence of hardware loosening or infection based on x-ray.

## 2023-11-29 NOTE — Progress Notes (Signed)
 Right knee x-ray shows knee replacement that still looks pretty good.

## 2023-11-29 NOTE — Patient Instructions (Addendum)
 Thank you for coming in today.   Please get an Xray today before you leave   Consider Genicular Artery Embolization procedure. Handout provided  Check back as needed

## 2023-11-29 NOTE — Progress Notes (Signed)
   I, Leotis Batter, CMA acting as a scribe for Artist Lloyd, MD.  [name] [name] is a 73 y.o. female who presents to [facility] at [facility] today for right knee pain and swelling. Pt was last seen by Dr. Lloyd on 11/16/22 for bilateral lower back pain and LE edema. Due to lack of response to prior conservative treatments, patient was referred to PT for aquatic therapy. Stressed importance of continued use of compression stockings.   Today, patient reports exacerbation of right knee pain and swelling. Feels fluid moving around. The knee locks up at times. Also having bilat hip pain. Denies fall. Has tried Tylenol  and BC with minimal relief. Sx have improved some since onset. Ambulating in wheelchair today.    Diagnostic Imaging  04/14/23 XR R Knee   Pertinent review of systems: No fevers or chills  Relevant historical information: History of knee replacement.  Lymphedema bilateral lower extremities.   Exam:  BP 136/76   Pulse 77   Ht 5' 4 (1.626 m)   SpO2 96%   BMI 46.35 kg/m  General: Well Developed, well nourished, and in no acute distress.   MSK: Right knee mature scar anterior knee mild effusion normal motion.  Decree strength.    Lab and Radiology Results  X-ray images bilateral knees obtained today personally and independently interpreted.  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  No hardware deformity.  No significant loosening is visible.  Left knee: Conventional total knee replacement.  No obvious loosening.  Await formal radiology review   Assessment and Plan: 73 y.o. female with chronic right knee pain.  Pain is since improved since her visit was scheduled.  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  She has had a revision.  At this point if pain is not well-controlled enough would recommend genicular artery embolization consultation.  She is not a good surgical candidate.  She  has very poor functional status.   PDMP not reviewed this encounter. Orders Placed This Encounter  Procedures   DG Knee AP/LAT W/Sunrise Left    Standing Status:   Future    Number of Occurrences:   1    Expiration Date:   12/30/2023    Reason for Exam (SYMPTOM  OR DIAGNOSIS REQUIRED):   bilateral knee pain    Preferred imaging location?:   [facility]   DG Knee AP/LAT W/Sunrise Right    Standing Status:   Future    Number of Occurrences:   1    Expiration Date:   12/30/2023    Reason for Exam (SYMPTOM  OR DIAGNOSIS REQUIRED):   bilateral knee pain    Preferred imaging location?:   [place]   No orders of the defined types were placed in this encounter.    Discussed warning signs or symptoms. Please see discharge instructions. Patient expresses understanding.   The above documentation has been reviewed and is accurate and complete Artist Lloyd, M.D.

## 2023-11-30 ENCOUNTER — Telehealth: Payer: Self-pay

## 2023-11-30 NOTE — Telephone Encounter (Signed)
 Called pt and advised of results per Dr. Joane. Pt verbalized understanding. Inquiring about referral to neuro for her back and hips. I let her know that the only referral that I see was for aqua therapy. Will check with Dr. Joane and get back to her.

## 2023-12-02 NOTE — Telephone Encounter (Signed)
 The plan was to refer to physical therapy.

## 2023-12-05 DIAGNOSIS — Z1231 Encounter for screening mammogram for malignant neoplasm of breast: Secondary | ICD-10-CM | POA: Diagnosis not present

## 2023-12-06 NOTE — Telephone Encounter (Signed)
 Pt returned call, please call her back on her cell [phone].

## 2023-12-06 NOTE — Telephone Encounter (Signed)
 Patient called back to speak to [facility]

## 2023-12-06 NOTE — Telephone Encounter (Signed)
Called pt, left VM to call the office.  

## 2023-12-06 NOTE — Telephone Encounter (Signed)
 Called this number [phone]; no answer and VM was not set up. Called [phone] and spoke to pt. Advised the referral was for aquatic therapy and the referral was for her back/lumbar radiculopathy. Pt already scheduled. Pt verbalized understanding.

## 2023-12-12 DIAGNOSIS — E7800 Pure hypercholesterolemia, unspecified: Secondary | ICD-10-CM | POA: Diagnosis not present

## 2023-12-12 DIAGNOSIS — E1169 Type 2 diabetes mellitus with other specified complication: Secondary | ICD-10-CM | POA: Diagnosis not present

## 2023-12-12 DIAGNOSIS — Z8673 Personal history of transient ischemic attack (TIA), and cerebral infarction without residual deficits: Secondary | ICD-10-CM | POA: Diagnosis not present

## 2023-12-12 DIAGNOSIS — I10 Essential (primary) hypertension: Secondary | ICD-10-CM | POA: Diagnosis not present

## 2023-12-12 NOTE — Telephone Encounter (Signed)
 Patient called back with more questions in regards to this referral. Can you call patient to discuss?

## 2023-12-13 DIAGNOSIS — L218 Other seborrheic dermatitis: Secondary | ICD-10-CM | POA: Diagnosis not present

## 2023-12-13 DIAGNOSIS — L6612 Frontal fibrosing alopecia: Secondary | ICD-10-CM | POA: Diagnosis not present

## 2023-12-13 DIAGNOSIS — I872 Venous insufficiency (chronic) (peripheral): Secondary | ICD-10-CM | POA: Diagnosis not present

## 2023-12-13 NOTE — Telephone Encounter (Signed)
 Lets see if we can get this straight with this patient.

## 2023-12-14 NOTE — Telephone Encounter (Signed)
 Called and spoke with patient about PT. I advised that they will be working on strengthening her back and trying to help her legs. Pt verbalized understanding and plans to complete PT as scheduled.

## 2023-12-15 ENCOUNTER — Telehealth: Payer: Self-pay | Admitting: Family Medicine

## 2023-12-15 NOTE — Telephone Encounter (Signed)
 Pt states her former ortho always had her take antibiotics before her dental appts due to her artificial legs.  Pt had a dental visit today and forgot about this. Sounds like the dentist gave her antibiotics AFTER the procedure and pt now very concerned this will cause a problem.  Pt aware we have not dealt with this, wants Dr. Virgilio opinion.

## 2023-12-16 NOTE — Telephone Encounter (Signed)
 I think it will be fine.

## 2023-12-19 NOTE — Telephone Encounter (Signed)
 Spoke to pt, listened to her re-tell the story, and provided Dr. Virgilio advise. Pt verbalized understanding.

## 2023-12-21 ENCOUNTER — Telehealth: Payer: Self-pay | Admitting: Family Medicine

## 2023-12-21 NOTE — Telephone Encounter (Signed)
 Patient called requesting a call back to review over her lumbar spine xrays.  Please advise.

## 2023-12-23 NOTE — Telephone Encounter (Signed)
 Called and spoke to pt. She reports someone already calling her to discuss her back. I wished her a great weekend and the call was ended.

## 2023-12-29 ENCOUNTER — Ambulatory Visit (HOSPITAL_BASED_OUTPATIENT_CLINIC_OR_DEPARTMENT_OTHER): Attending: Family Medicine | Admitting: Physical Therapy

## 2023-12-29 ENCOUNTER — Other Ambulatory Visit: Payer: Self-pay

## 2023-12-29 ENCOUNTER — Encounter (HOSPITAL_BASED_OUTPATIENT_CLINIC_OR_DEPARTMENT_OTHER): Payer: Self-pay | Admitting: Physical Therapy

## 2023-12-29 DIAGNOSIS — M6281 Muscle weakness (generalized): Secondary | ICD-10-CM | POA: Diagnosis present

## 2023-12-29 DIAGNOSIS — R262 Difficulty in walking, not elsewhere classified: Secondary | ICD-10-CM | POA: Insufficient documentation

## 2023-12-29 DIAGNOSIS — M25562 Pain in left knee: Secondary | ICD-10-CM | POA: Insufficient documentation

## 2023-12-29 DIAGNOSIS — M25561 Pain in right knee: Secondary | ICD-10-CM | POA: Diagnosis present

## 2023-12-29 DIAGNOSIS — M545 Low back pain, unspecified: Secondary | ICD-10-CM | POA: Diagnosis not present

## 2023-12-29 DIAGNOSIS — G8929 Other chronic pain: Secondary | ICD-10-CM | POA: Insufficient documentation

## 2023-12-29 DIAGNOSIS — M5459 Other low back pain: Secondary | ICD-10-CM | POA: Insufficient documentation

## 2023-12-29 NOTE — Therapy (Signed)
 OUTPATIENT PHYSICAL THERAPY THORACOLUMBAR EVALUATION   Patient Name: [name] [name] MRN: [id number] DOB:1951-02-12, 73 y.o., female Today's Date: 12/29/2023  END OF SESSION:  PT End of Session - 12/29/23 1100     Visit Number 1    Date for Recertification  02/24/24    Authorization Type [facility] m[facility]    PT Start Time 0935    PT Stop Time 1015    PT Time Calculation (min) 40 min    Activity Tolerance Patient tolerated treatment well    Behavior During Therapy WFL for tasks assessed/performed          Past Medical History:  Diagnosis Date   Arthritis    right knee (07/02/2015)   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   Diabetes (HCC)    type II   Hyperlipidemia    Hypertension    Obesity    OSA (obstructive sleep apnea)    Overactive bladder    S/P right knee arthroscopy    with septic joint growing out MSSA   Septic arthritis of knee, right (HCC)    Wears glasses    Past Surgical History:  Procedure Laterality Date   BIOPSY  08/05/2020   Procedure: BIOPSY;  Surgeon: Elicia Claw, MD;  Location: [facility];  Service: Gastroenterology;;   COLONOSCOPY     COLONOSCOPY WITH PROPOFOL  N/A 08/05/2020   Procedure: COLONOSCOPY WITH PROPOFOL ;  Surgeon: Elicia Claw, MD;  Location: [facility];  Service: Gastroenterology;  Laterality: N/A;   DIAGNOSTIC LAPAROSCOPY     IR CT HEAD LTD  08/12/2022   IR PERCUTANEOUS ART THROMBECTOMY/INFUSION INTRACRANIAL INC DIAG ANGIO  08/12/2022   IR US  GUIDE VASC ACCESS RIGHT  08/12/2022   IR US  GUIDE VASC ACCESS RIGHT  08/12/2022   JOINT REPLACEMENT     KNEE ARTHROSCOPY Right 12/09/2012   Procedure: ARTHROSCOPIC IRRIGATION AND DEBRIDEMENT RIGHT KNEE;  Surgeon: Ozell VEAR Bruch, MD;  Location: [facility] OR;  Service: Orthopedics;  Laterality: Right;   LEFT HEART CATH AND CORONARY ANGIOGRAPHY N/A 09/02/2016   Procedure: Left Heart Cath and Coronary Angiography;  Surgeon: Levern Hutching, MD;  Location: [facility] INVASIVE CV LAB;   Service: Cardiovascular;  Laterality: N/A;   RADIOLOGY WITH ANESTHESIA N/A 08/11/2022   Procedure: IR WITH ANESTHESIA;  Surgeon: Dolphus Carrion, MD;  Location: [facility] OR;  Service: Radiology;  Laterality: N/A;   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/06/2014   Procedure: PLACEMENT OF CEMENT SPACER;  Surgeon: Toribio JULIANNA Chancy, MD;  Location: [facility] OR;  Service: Orthopedics;  Laterality: Right;   TOTAL KNEE ARTHROPLASTY Bilateral 2011-2012   left-right   TOTAL KNEE REVISION Right 11/06/2014   Procedure: REMOVAL OF TOTAL COMPONENTS, EXTENSIVE  IRRIGATION AND DEBRIDEMENT  RIGHT KNEE;  Surgeon: Toribio JULIANNA Chancy, MD;  Location: [facility] OR;  Service: Orthopedics;  Laterality: Right;   TOTAL KNEE REVISION Right 07/02/2015   TOTAL KNEE REVISION Right 07/02/2015   Procedure: TOTAL KNEE REVISION;  Surgeon: Toribio JULIANNA Chancy, MD;  [facility] OR;  Service: Orthopedics;  Laterality: Right;   TUBAL LIGATION  ~ 1980   tummy tuck  1980s   Patient Active Problem List   Diagnosis Date Noted   Chronic bilateral low back pain without sciatica 11/16/2023   Generalized weakness 07/16/2023   Hematoma of groin 08/12/2022   Endotracheal tube present 08/12/2022   AKI (acute kidney injury) 08/12/2022   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/11/2022   Stroke (HCC) 10/06/2017   Acute CVA (cerebrovascular accident) (HCC) 10/06/2017  Trigger thumb, right thumb 05/03/2016   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   Protein-calorie malnutrition 11/15/2014   Osteomyelitis of right knee region Scnetx) 11/08/2014   Complication of device 11/04/2014   Septic arthritis (HCC) 11/04/2014   S/P right knee arthroscopy    UTI (urinary tract infection) 12/09/2012   Infection of prosthetic knee joint 12/09/2012   Hyperlipidemia    Hypertension     PCP: Montie Pizza MD  REFERRING PROVIDER: Artist Lloyd MD  REFERRING DIAG: M54.16 (ICD-10-CM) - Lumbar radiculopathy   Rationale for Evaluation and Treatment: Rehabilitation  THERAPY DIAG:   Other low back pain  Difficulty in walking, not elsewhere classified  Muscle weakness (generalized)  Bilateral chronic knee pain  ONSET DATE: chronic  SUBJECTIVE:                                                                                                                                                                                           SUBJECTIVE STATEMENT: I have tried everything.  I just need my back and knees to be better so I can get around.  I don't want anymore shots.  Living with dtr.  I cook and do laundry.  Keep myself busy.  No steps  PERTINENT HISTORY:  Dr Lloyd 10/1: chronic low back pain primarily due to facet degeneration and overall degenerative changes lumbar spine along with muscle weakness and spasm. She is already had good conservative management trials including multiple different back procedures which have not helped.   . . .seen pain management and had multiple back injections and no longer wants any further back injections  History of a stroke. Lymphedema bilateral lower extremities.  PAIN:  Are you having pain? Yes: NPRS scale: current 5/10 Pain location: across low thoracic area  Pain description: sharpe Aggravating factors: standing 5 mins Relieving factors: sitting/resting  Bilat knee pain 1/5  PRECAUTIONS: Fall  RED FLAGS: None   WEIGHT BEARING RESTRICTIONS: No  FALLS:  Has patient fallen in last 6 months? No  LIVING ENVIRONMENT: Lives with: lives with their daughter Lives in: House/apartment Stairs: No Has following equipment at home: Single point cane and Shower bench  OCCUPATION: retired/disability  PLOF: Independent with household mobility with device  PATIENT GOALS: improve pain in knees and back, improve walking, get stronger  NEXT MD VISIT: ~ 3 months  OBJECTIVE:  Note: Objective measures were completed at Evaluation unless otherwise noted.  DIAGNOSTIC FINDINGS:  10/25 x-ray R Knee IMPRESSION: 1. Status post  bilateral total knee arthroplasty, with hinged long-stem arthroplasty on the right. No evidence of perihardware fracture or component malpositioning. 2. Diffuse soft tissue edema of  the included lower extremities.  10/25 x-ray L IMPRESSION: 1. Status post bilateral total knee arthroplasty, with hinged long-stem arthroplasty on the right. No evidence of perihardware fracture or component malpositioning. 2. Diffuse soft tissue edema of the included lower extremities.  8/25 x-ray Lumbar  IMPRESSION: 1. No acute fracture or traumatic malalignment. 2. Multilevel spondylosis with moderate disc space height loss at L5-S1 and moderate-advanced facet arthropathy from L3-S1.  8/25 x-ray bilateral ankle IMPRESSION:  Multifocal osteoarthritis.   PATIENT SURVEYS:  ODI: 20/50= 40%   COGNITION: Overall cognitive status: Within functional limits for tasks assessed    EDEMA: bilat LE lymphedema  POSTURE: appears wfl; difficult to assess due to body habitus  PALPATION: Moderate TPP Right thoracic paraspinals  LUMBAR ROM:   AROM eval  Flexion full  Extension 50% limited P!  Right lateral flexion 50% limited P!  Left lateral flexion 50% limited P!  Right rotation   Left rotation    (Blank rows = not tested)  LOWER EXTREMITY ROM:     Knee rom limited due to LE lymphedema  LOWER EXTREMITY MMT:    MMT Right eval Left eval  Hip flexion 3+ 3+  Hip extension    Hip abduction 5 5  Hip adduction    Hip internal rotation    Hip external rotation    Knee flexion 4 4  Knee extension    Ankle dorsiflexion 5 5  Ankle plantarflexion 5 5  Ankle inversion    Ankle eversion     (Blank rows = not tested)    FUNCTIONAL TESTS:  Timed up and go (TUG): 25.73       Item Test date: 12/29/23 Date:  Date:   Sitting to standing 2. able to stand using hands after several tries Insert SmartPhrase OPRCBERGREEVAL Insert SmartPhrase OPRCBERGREEVAL  2. Standing unsupported 4. able to stand  safely for 2 minutes    3. Sitting with back unsupported, feet supported 4. able to sit safely and securely for 2 minutes    4. Standing to sitting 2. uses back of legs against chair to control descent    5. Pivot transfer  3. able to transfer safely with definite need of hands    6. Standing unsupported with eyes closed 3. able to stand 10 seconds with supervision    7. Standing unsupported with feet together 2. able to place feet together independently but unable to hold for 30 seconds    8. Reaching forward with outstretched arms while standing 3. can reach forward 12 cm (5 inches)    9. Pick up object from the floor from standing 2. unable to pick up but reaches 2-5 cm (1-2 inches) from slipper and keeps balance independently    10. Turning to look behind over left and right shoulders while standing 2. turns sideways but only maintains balance    11. Turn 360 degrees 2. able to turn 360 degrees safely but slowly    12. Place alternate foot on step or stool while standing unsupported 0. needs assistance to keep from falling/unable to try    13. Standing unsupported one foot in front 0. loses balance while stepping or standing    14. Standing on one leg 0. unable to try of needs assist to prevent fall      Total Score 29/56 Total Score:    Total Score:      GAIT: Distance walked: 20 ft Assistive device utilized: Single point cane Level of assistance: Modified independence Comments: lateral displacement  R and L/slight hip hiking and reduced knee flex with swing. Antalgic  TREATMENT  Eval Self care:Posture and optometrist instruction.  AD cane vs rollator. Safe transfers STS, height of chairs and use of arm rests.                                                                                                                               PATIENT EDUCATION:  Education details: Discussed eval findings, rehab rationale, aquatic program progression/POC and pools in area. Patient is in  agreement  Person educated: Patient Education method: Explanation Education comprehension: verbalized understanding  HOME EXERCISE PROGRAM: TBA  ASSESSMENT:  CLINICAL IMPRESSION: Patient is a 73 y.o. f who was seen today for physical therapy evaluation and treatment for LBP. She has a long hx of back as well as bilat knee pain consistent with OA which have not responded positively to other interventions including land PT. She has further complications with mobility due to bilat LE lymphedema. She presents with pain limited deficits in  LB >knees  in ROM, strength, endurance, activity tolerance, gait, balance, and functional mobility with ADL's. Patients goals are focused on recovering improved level of function to improve QOL. She is a good candidate for skilled aquatic PT to improve all deficit, introduce and instruct on the using the properties of water for pain management, improving balance and strength, improving function.    OBJECTIVE IMPAIRMENTS: Abnormal gait, decreased activity tolerance, decreased balance, decreased knowledge of use of DME, decreased mobility, difficulty walking, decreased ROM, decreased strength, increased edema, postural dysfunction, obesity, and pain.   ACTIVITY LIMITATIONS: carrying, lifting, bending, standing, squatting, stairs, transfers, and caring for others  PARTICIPATION LIMITATIONS: meal prep, cleaning, laundry, shopping, community activity, and yard work  PERSONAL FACTORS: Past/current experiences, Time since onset of injury/illness/exacerbation, and 1-2 comorbidities: See PmHx are also affecting patient's functional outcome.   REHAB POTENTIAL: Good  CLINICAL DECISION MAKING: Stable/uncomplicated  EVALUATION COMPLEXITY: Low   GOALS: Goals reviewed with patient? Yes  SHORT TERM GOALS: Target date: 01/28/24  Pt will tolerate full aquatic sessions consistently without increase in pain and with improving function to demonstrate good toleration  and effectiveness of intervention.  Baseline: Goal status: INITIAL  2.  Pt will consider gaining pool access for use of the properties of water for chronic conditions maintaining mobility and minimizing pain. Baseline:  Goal status: INITIAL  3.  Pt will tolerate walking to and/or from setting using AD as needed and engaging in aquatic therapy session without excessive fatigue or increase in pain to demonstrate improved toleration to activity. Baseline:  Goal status: INITIAL  4.  Pt will report a reduction in pain while submerged by at least 75% to demonstrate how the properties of water manage pain Baseline:  Goal status: INITIAL    LONG TERM GOALS: Target date: 02/24/23  Pt to improve on ODI by 13  % to demonstrate statistically significant Improvement in function. (MCID 13-15%) Baseline:  ODI: 20/50= 40% Goal status: INITIAL  2.  Pt will improve on Berg balance test to >/= 40/56 to demonstrate a decrease in fall risk. (MDC 7) Baseline: 29/56 Goal status: INITIAL  3.  Pt will improve strength in hip flex by at least 1 grade to demonstrate improved overall physical function Baseline:  Goal status: INITIAL  4.  Pt will report decrease in pain by at least 50% for improved toleration to activity/quality of life and to demonstrate improved management of pain. Baseline:  Goal status: INITIAL  5.  Pt will report ability to stand (and cook) for up to 15 minutes Baseline:  Goal status: INITIAL  6.  Pt will be indep with final HEP for continued management of condition Baseline:  Goal status: INITIAL  PLAN:  PT FREQUENCY: 2x/week  PT DURATION: 8 weeks  PLANNED INTERVENTIONS: 97164- PT Re-evaluation, 97750- Physical Performance Testing, 97110-Therapeutic exercises, 97530- Therapeutic activity, 97112- Neuromuscular re-education, 97535- Self Care, 02859- Manual therapy, [phone]- Gait training, [phone]- Aquatic Therapy, [phone]- Electrical stimulation (unattended), [phone]- Electrical  stimulation (manual), [id number]- Ionotophoresis 4mg /ml Dexamethasone , 79439 (1-2 muscles), 20561 (3+ muscles)- Dry Needling, Patient/Family education, Balance training, Stair training, Taping, Joint mobilization, DME instructions, Cryotherapy, and Moist heat.  PLAN FOR NEXT SESSION: aquatic only: le and core strengthening/stretching/ROM; balance and gait retraining, stair climbing; HEP; pool access   [place]) Rad Gramling MPT 12/29/23 12:18 PM [facility] [address] [place], [place], [address] Phone: [phone]   Fax:  [phone]   Date of referral: 11/16/23 Referring provider: Artist Lloyd MD Referring diagnosis? M54.16 (ICD-10-CM) - Lumbar radiculopathy  Treatment diagnosis? (if different than referring diagnosis) no  What was this (referring dx) caused by? Ongoing Issue and Arthritis  Lysle of Condition: Chronic (continuous duration > 3 months)   Laterality: Both  Current Functional Measure Score: Other ODI20/50= 40%  Objective measurements identify impairments when they are compared to normal values, the uninvolved extremity, and prior level of function.  [x]  Yes  []  No  Objective assessment of functional ability: Moderate functional limitations   Briefly describe symptoms: chronic LBP and knee pain limiting all functional mobility, balance/safety with Adl's   How did symptoms start: OA/chronic  Average pain intensity:  Last 24 hours: 5/10  Past week: 5/10  How often does the pt experience symptoms? Constantly  How much have the symptoms interfered with usual daily activities? Quite a bit  How has condition changed since care began at this facility? NA - initial visit  In general, how is the patients overall health? Good   BACK PAIN (STarT Back Screening Tool) Has pain spread down the leg(s) at some time in the last 2 weeks? no Has there been pain in the shoulder or neck at some time in the last 2 weeks?  no Has the pt only walked short distances because of back pain? yes Has patient dressed more slowly because of back pain in the past 2 weeks? yes Does patient think it's not safe for a person with this condition to be physically active? no Does patient have worrying thoughts a lot of the time? no Does patient feel back pain is terrible and will never get any better? yes Has patient stopped enjoying things they usually enjoy? yes

## 2024-01-03 ENCOUNTER — Encounter (HOSPITAL_BASED_OUTPATIENT_CLINIC_OR_DEPARTMENT_OTHER): Payer: Self-pay | Admitting: Physical Therapy

## 2024-01-03 ENCOUNTER — Ambulatory Visit (HOSPITAL_BASED_OUTPATIENT_CLINIC_OR_DEPARTMENT_OTHER): Admitting: Physical Therapy

## 2024-01-03 DIAGNOSIS — R262 Difficulty in walking, not elsewhere classified: Secondary | ICD-10-CM

## 2024-01-03 DIAGNOSIS — M5459 Other low back pain: Secondary | ICD-10-CM

## 2024-01-03 DIAGNOSIS — M6281 Muscle weakness (generalized): Secondary | ICD-10-CM

## 2024-01-03 NOTE — Therapy (Signed)
 OUTPATIENT PHYSICAL THERAPY THORACOLUMBAR TREATMENT   Patient Name: [name] [name] MRN: [id number] DOB:September 26, 1950, 73 y.o., female Today's Date: 01/03/2024  END OF SESSION:  PT End of Session - 01/03/24 0944     Visit Number 2    Date for Recertification  02/24/24    Authorization Type uhc mcr    PT Start Time [phone]    PT Stop Time 1015    PT Time Calculation (min) 33 min    Activity Tolerance Patient tolerated treatment well    Behavior During Therapy [facility] for tasks assessed/performed          Past Medical History:  Diagnosis Date   Arthritis    right knee (07/02/2015)   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   Diabetes (HCC)    type II   Hyperlipidemia    Hypertension    Obesity    OSA (obstructive sleep apnea)    Overactive bladder    S/P right knee arthroscopy    with septic joint growing out MSSA   Septic arthritis of knee, right (HCC)    Wears glasses    Past Surgical History:  Procedure Laterality Date   BIOPSY  08/05/2020   Procedure: BIOPSY;  Surgeon: Elicia Claw, MD;  Location: [facility];  Service: Gastroenterology;;   COLONOSCOPY     COLONOSCOPY WITH PROPOFOL  N/A 08/05/2020   Procedure: COLONOSCOPY WITH PROPOFOL ;  Surgeon: Elicia Claw, MD;  Location: [facility];  Service: Gastroenterology;  Laterality: N/A;   DIAGNOSTIC LAPAROSCOPY     IR CT HEAD LTD  08/12/2022   IR PERCUTANEOUS ART THROMBECTOMY/INFUSION INTRACRANIAL INC DIAG ANGIO  08/12/2022   IR US  GUIDE VASC ACCESS RIGHT  08/12/2022   IR US  GUIDE VASC ACCESS RIGHT  08/12/2022   JOINT REPLACEMENT     KNEE ARTHROSCOPY Right 12/09/2012   Procedure: ARTHROSCOPIC IRRIGATION AND DEBRIDEMENT RIGHT KNEE;  Surgeon: Ozell VEAR Bruch, MD;  Location: [facility] OR;  Service: Orthopedics;  Laterality: Right;   LEFT HEART CATH AND CORONARY ANGIOGRAPHY N/A 09/02/2016   Procedure: Left Heart Cath and Coronary Angiography;  Surgeon: Levern Hutching, MD;  Location: [facility] INVASIVE CV LAB;   Service: Cardiovascular;  Laterality: N/A;   RADIOLOGY WITH ANESTHESIA N/A 08/11/2022   Procedure: IR WITH ANESTHESIA;  Surgeon: Dolphus Carrion, MD;  Location: [facility] OR;  Service: Radiology;  Laterality: N/A;   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/06/2014   Procedure: PLACEMENT OF CEMENT SPACER;  Surgeon: Toribio JULIANNA Chancy, MD;  Location: [facility] OR;  Service: Orthopedics;  Laterality: Right;   TOTAL KNEE ARTHROPLASTY Bilateral 2011-2012   left-right   TOTAL KNEE REVISION Right 11/06/2014   Procedure: REMOVAL OF TOTAL COMPONENTS, EXTENSIVE  IRRIGATION AND DEBRIDEMENT  RIGHT KNEE;  Surgeon: Toribio JULIANNA Chancy, MD;  Location: [facility] OR;  Service: Orthopedics;  Laterality: Right;   TOTAL KNEE REVISION Right 07/02/2015   TOTAL KNEE REVISION Right 07/02/2015   Procedure: TOTAL KNEE REVISION;  Surgeon: Toribio JULIANNA Chancy, MD;  Location: [facility] OR;  Service: Orthopedics;  Laterality: Right;   TUBAL LIGATION  ~ 1980   tummy tuck  1980s   Patient Active Problem List   Diagnosis Date Noted   Chronic bilateral low back pain without sciatica 11/16/2023   Generalized weakness 07/16/2023   Hematoma of groin 08/12/2022   Endotracheal tube present 08/12/2022   AKI (acute kidney injury) 08/12/2022   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/11/2022   Stroke (HCC) 10/06/2017   Acute CVA (cerebrovascular accident) (HCC) 10/06/2017  Trigger thumb, right thumb 05/03/2016   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   Protein-calorie malnutrition 11/15/2014   Osteomyelitis of right knee region [facility]) 11/08/2014   Complication of device 11/04/2014   Septic arthritis (HCC) 11/04/2014   S/P right knee arthroscopy    UTI (urinary tract infection) 12/09/2012   Infection of prosthetic knee joint 12/09/2012   Hyperlipidemia    Hypertension     PCP: Montie Pizza MD  REFERRING PROVIDER: Artist Lloyd MD  REFERRING DIAG: M54.16 (ICD-10-CM) - Lumbar radiculopathy   Rationale for Evaluation and Treatment: Rehabilitation  THERAPY DIAG:   Other low back pain  Difficulty in walking, not elsewhere classified  Muscle weakness (generalized)  ONSET DATE: chronic  SUBJECTIVE:                                                                                                                                                                                           SUBJECTIVE STATEMENT: Pt reports having a cramp that has caught her on her left LB causing her increased pain and reducing her mobility.  She requested a WC to her car for assistance getting into clinic today.  Pain 7/10   Initial Subjective I have tried everything.  I just need my back and knees to be better so I can get around.  I don't want anymore shots.  Living with dtr.  I cook and do laundry.  Keep myself busy.  No steps  PERTINENT HISTORY:  Dr Lloyd 10/1: chronic low back pain primarily due to facet degeneration and overall degenerative changes lumbar spine along with muscle weakness and spasm. She is already had good conservative management trials including multiple different back procedures which have not helped.   . . .seen pain management and had multiple back injections and no longer wants any further back injections  History of a stroke. Lymphedema bilateral lower extremities.  PAIN:  Are you having pain? Yes: NPRS scale: current 5/10 Pain location: across low thoracic area  Pain description: sharp Aggravating factors: standing 5 mins Relieving factors: sitting/resting  Bilat knee pain 1/5  PRECAUTIONS: Fall  RED FLAGS: None   WEIGHT BEARING RESTRICTIONS: No  FALLS:  Has patient fallen in last 6 months? No  LIVING ENVIRONMENT: Lives with: lives with their daughter Lives in: House/apartment Stairs: No Has following equipment at home: Single point cane and Shower bench  OCCUPATION: retired/disability  PLOF: Independent with household mobility with device  PATIENT GOALS: improve pain in knees and back, improve walking, get  stronger  NEXT MD VISIT: ~ 3 months  OBJECTIVE:  Note: Objective measures were completed at Evaluation unless otherwise noted.  DIAGNOSTIC FINDINGS:  10/25 x-ray R Knee IMPRESSION: 1. Status post bilateral total knee arthroplasty, with hinged long-stem arthroplasty on the right. No evidence of perihardware fracture or component malpositioning. 2. Diffuse soft tissue edema of the included lower extremities.  10/25 x-ray L IMPRESSION: 1. Status post bilateral total knee arthroplasty, with hinged long-stem arthroplasty on the right. No evidence of perihardware fracture or component malpositioning. 2. Diffuse soft tissue edema of the included lower extremities.  8/25 x-ray Lumbar  IMPRESSION: 1. No acute fracture or traumatic malalignment. 2. Multilevel spondylosis with moderate disc space height loss at L5-S1 and moderate-advanced facet arthropathy from L3-S1.  8/25 x-ray bilateral ankle IMPRESSION:  Multifocal osteoarthritis.   PATIENT SURVEYS:  ODI: 20/50= 40%   COGNITION: Overall cognitive status: Within functional limits for tasks assessed    EDEMA: bilat LE lymphedema  POSTURE: appears wfl; difficult to assess due to body habitus  PALPATION: Moderate TPP Right thoracic paraspinals  LUMBAR ROM:   AROM eval  Flexion full  Extension 50% limited P!  Right lateral flexion 50% limited P!  Left lateral flexion 50% limited P!  Right rotation   Left rotation    (Blank rows = not tested)  LOWER EXTREMITY ROM:     Knee rom limited due to LE lymphedema  LOWER EXTREMITY MMT:    MMT Right eval Left eval  Hip flexion 3+ 3+  Hip extension    Hip abduction 5 5  Hip adduction    Hip internal rotation    Hip external rotation    Knee flexion 4 4  Knee extension    Ankle dorsiflexion 5 5  Ankle plantarflexion 5 5  Ankle inversion    Ankle eversion     (Blank rows = not tested)    FUNCTIONAL TESTS:  Timed up and go (TUG): 25.73       Item Test date:  12/29/23 Date:  Date:   Sitting to standing 2. able to stand using hands after several tries Insert SmartPhrase OPRCBERGREEVAL Insert SmartPhrase OPRCBERGREEVAL  2. Standing unsupported 4. able to stand safely for 2 minutes    3. Sitting with back unsupported, feet supported 4. able to sit safely and securely for 2 minutes    4. Standing to sitting 2. uses back of legs against chair to control descent    5. Pivot transfer  3. able to transfer safely with definite need of hands    6. Standing unsupported with eyes closed 3. able to stand 10 seconds with supervision    7. Standing unsupported with feet together 2. able to place feet together independently but unable to hold for 30 seconds    8. Reaching forward with outstretched arms while standing 3. can reach forward 12 cm (5 inches)    9. Pick up object from the floor from standing 2. unable to pick up but reaches 2-5 cm (1-2 inches) from slipper and keeps balance independently    10. Turning to look behind over left and right shoulders while standing 2. turns sideways but only maintains balance    11. Turn 360 degrees 2. able to turn 360 degrees safely but slowly    12. Place alternate foot on step or stool while standing unsupported 0. needs assistance to keep from falling/unable to try    13. Standing unsupported one foot in front 0. loses balance while stepping or standing    14. Standing on one leg 0. unable to try of needs assist to prevent fall  Total Score 29/56 Total Score:    Total Score:      GAIT: Distance walked: 20 ft Assistive device utilized: Single point cane Level of assistance: Modified independence Comments: lateral displacement  R and L/slight hip hiking and reduced knee flex with swing. Antalgic  TREATMENT  [facility] Adult PT Treatment:                                                DATE: 01/03/24 Pt seen for aquatic therapy today.  Treatment took place in water 3.5-4.75 ft in depth at the [facility] pool.  Temp of water was 91.  Pt entered/exited the pool via lift  *Intro to setting *seated on lift: cycling; hip add/abd; LAQ *VC and demonstration for STS from lift submerged *Ue support on wall: toe raises; heel raises; hip add/abd; HS curls x 10 *walking forward in 3.6- 4.3 ft with ue support of barbell.  Pt requires the buoyancy and hydrostatic pressure of water for support, and to offload joints by unweighting joint load by at least 50 % in navel deep water and by at least 75-80% in chest to neck deep water.  Viscosity of the water is needed for resistance of strengthening. Water current perturbations provides challenge to standing balance requiring increased core activation.                                                                                                                                  PATIENT EDUCATION:  Education details: Discussed eval findings, rehab rationale, aquatic program progression/POC and pools in area. Patient is in agreement  Person educated: Patient Education method: Explanation Education comprehension: verbalized understanding  HOME EXERCISE PROGRAM: TBA  ASSESSMENT:  CLINICAL IMPRESSION: Pt required assistance out of her car to get into clinic today due to a new pain/cramping she has been having in left LB/hip. She is aware of the policy that requires her to be able to get to and from setting indep or with a personal caregiver.  Pt requires lift for safety in and out of pool. She demonstrates safety and independence in 3.6-4.3 ft with therapist instructing from deck.  Pt is directed through various movement patterns and trials in both sitting and standing positions.   She is provided VC and demonstration throughout session for execution of exercises while monitoring toleration.As session progresses pt demonstrating increasing confidence submerged maintaining balance and moving fluidly. She is a good candidate for aquatic intervention and will benefit from  the properties of water to progress towards functional goals.    Initial Impression Patient is a 73 y.o. f who was seen today for physical therapy evaluation and treatment for LBP. She has a long hx of back as well as bilat knee pain consistent with OA which have not responded positively to other  interventions including land PT. She has further complications with mobility due to bilat LE lymphedema. She presents with pain limited deficits in  LB >knees  in ROM, strength, endurance, activity tolerance, gait, balance, and functional mobility with ADL's. Patients goals are focused on recovering improved level of function to improve QOL. She is a good candidate for skilled aquatic PT to improve all deficit, introduce and instruct on the using the properties of water for pain management, improving balance and strength, improving function.    OBJECTIVE IMPAIRMENTS: Abnormal gait, decreased activity tolerance, decreased balance, decreased knowledge of use of DME, decreased mobility, difficulty walking, decreased ROM, decreased strength, increased edema, postural dysfunction, obesity, and pain.   ACTIVITY LIMITATIONS: carrying, lifting, bending, standing, squatting, stairs, transfers, and caring for others  PARTICIPATION LIMITATIONS: meal prep, cleaning, laundry, shopping, community activity, and yard work  PERSONAL FACTORS: Past/current experiences, Time since onset of injury/illness/exacerbation, and 1-2 comorbidities: See PmHx are also affecting patient's functional outcome.   REHAB POTENTIAL: Good  CLINICAL DECISION MAKING: Stable/uncomplicated  EVALUATION COMPLEXITY: Low   GOALS: Goals reviewed with patient? Yes  SHORT TERM GOALS: Target date: 01/28/24  Pt will tolerate full aquatic sessions consistently without increase in pain and with improving function to demonstrate good toleration and effectiveness of intervention.  Baseline: Goal status: INITIAL  2.  Pt will consider gaining  pool access for use of the properties of water for chronic conditions maintaining mobility and minimizing pain. Baseline:  Goal status: INITIAL  3.  Pt will tolerate walking to and/or from setting using AD as needed and engaging in aquatic therapy session without excessive fatigue or increase in pain to demonstrate improved toleration to activity. Baseline:  Goal status: INITIAL  4.  Pt will report a reduction in pain while submerged by at least 75% to demonstrate how the properties of water manage pain Baseline:  Goal status: INITIAL    LONG TERM GOALS: Target date: 02/24/23  Pt to improve on ODI by 13  % to demonstrate statistically significant Improvement in function. (MCID 13-15%) Baseline: ODI: 20/50= 40% Goal status: INITIAL  2.  Pt will improve on Berg balance test to >/= 40/56 to demonstrate a decrease in fall risk. (MDC 7) Baseline: 29/56 Goal status: INITIAL  3.  Pt will improve strength in hip flex by at least 1 grade to demonstrate improved overall physical function Baseline:  Goal status: INITIAL  4.  Pt will report decrease in pain by at least 50% for improved toleration to activity/quality of life and to demonstrate improved management of pain. Baseline:  Goal status: INITIAL  5.  Pt will report ability to stand (and cook) for up to 15 minutes Baseline:  Goal status: INITIAL  6.  Pt will be indep with final HEP for continued management of condition Baseline:  Goal status: INITIAL  PLAN:  PT FREQUENCY: 2x/week  PT DURATION: 8 weeks  PLANNED INTERVENTIONS: [id number]- PT Re-evaluation, 97750- Physical Performance Testing, 97110-Therapeutic exercises, 97530- Therapeutic activity, 97112- Neuromuscular re-education, 97535- Self Care, [id number]- Manual therapy, [phone]- Gait training, [phone]- Aquatic Therapy, [phone]- Electrical stimulation (unattended), [phone]- Electrical stimulation (manual), [id number]- Ionotophoresis 4mg /ml Dexamethasone , [id number] (1-2 muscles), [phone] (3+ muscles)-  Dry Needling, Patient/Family education, Balance training, Stair training, Taping, Joint mobilization, DME instructions, Cryotherapy, and Moist heat.  PLAN FOR NEXT SESSION: aquatic only: le and core strengthening/stretching/ROM; balance and gait retraining, stair climbing; HEP; pool access   [place] Kem) Mackensi Mahadeo MPT 01/03/24 1:07 PM [facility] [address]  [place], [place], [address] Phone: [phone]   Fax:  [phone]   Date of referral: 11/16/23 Referring provider: Artist Lloyd MD Referring diagnosis? M54.16 (ICD-10-CM) - Lumbar radiculopathy  Treatment diagnosis? (if different than referring diagnosis) no  What was this (referring dx) caused by? Ongoing Issue and Arthritis  Lysle of Condition: Chronic (continuous duration > 3 months)   Laterality: Both  Current Functional Measure Score: Other ODI20/50= 40%  Objective measurements identify impairments when they are compared to normal values, the uninvolved extremity, and prior level of function.  [x]  Yes  []  No  Objective assessment of functional ability: Moderate functional limitations   Briefly describe symptoms: chronic LBP and knee pain limiting all functional mobility, balance/safety with Adl's   How did symptoms start: OA/chronic  Average pain intensity:  Last 24 hours: 5/10  Past week: 5/10  How often does the pt experience symptoms? Constantly  How much have the symptoms interfered with usual daily activities? Quite a bit  How has condition changed since care began at this facility? NA - initial visit  In general, how is the patients overall health? Good   BACK PAIN (STarT Back Screening Tool) Has pain spread down the leg(s) at some time in the last 2 weeks? no Has there been pain in the shoulder or neck at some time in the last 2 weeks? no Has the pt only walked short distances because of back pain? yes Has patient dressed more slowly because of  back pain in the past 2 weeks? yes Does patient think it's not safe for a person with this condition to be physically active? no Does patient have worrying thoughts a lot of the time? no Does patient feel back pain is terrible and will never get any better? yes Has patient stopped enjoying things they usually enjoy? yes

## 2024-01-10 ENCOUNTER — Ambulatory Visit (HOSPITAL_BASED_OUTPATIENT_CLINIC_OR_DEPARTMENT_OTHER): Admitting: Physical Therapy

## 2024-01-10 ENCOUNTER — Encounter (HOSPITAL_BASED_OUTPATIENT_CLINIC_OR_DEPARTMENT_OTHER): Payer: Self-pay | Admitting: Physical Therapy

## 2024-01-10 DIAGNOSIS — M5459 Other low back pain: Secondary | ICD-10-CM

## 2024-01-10 DIAGNOSIS — M6281 Muscle weakness (generalized): Secondary | ICD-10-CM

## 2024-01-10 DIAGNOSIS — R262 Difficulty in walking, not elsewhere classified: Secondary | ICD-10-CM

## 2024-01-10 DIAGNOSIS — G8929 Other chronic pain: Secondary | ICD-10-CM

## 2024-01-10 NOTE — Therapy (Signed)
 OUTPATIENT PHYSICAL THERAPY THORACOLUMBAR TREATMENT   Patient Name: [name] [name] MRN: [id number] DOB:Sep 04, 1950, 73 y.o., female Today's Date: 01/10/2024  END OF SESSION:  PT End of Session - 01/10/24 0950     Visit Number 3    Date for Recertification  02/24/24    Authorization Type [facility] mcr    PT Start Time 0930    PT Stop Time 1010    PT Time Calculation (min) 40 min    Behavior During Therapy [facility] for tasks assessed/performed          Past Medical History:  Diagnosis Date   Arthritis    right knee (07/02/2015)   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   Diabetes (HCC)    type II   Hyperlipidemia    Hypertension    Obesity    OSA (obstructive sleep apnea)    Overactive bladder    S/P right knee arthroscopy    with septic joint growing out MSSA   Septic arthritis of knee, right (HCC)    Wears glasses    Past Surgical History:  Procedure Laterality Date   BIOPSY  08/05/2020   Procedure: BIOPSY;  Surgeon: Elicia Claw, MD;  Location: [facility];  Service: Gastroenterology;;   COLONOSCOPY     COLONOSCOPY WITH PROPOFOL  N/A 08/05/2020   Procedure: COLONOSCOPY WITH PROPOFOL ;  Surgeon: Elicia Claw, MD;  Location: [facility];  Service: Gastroenterology;  Laterality: N/A;   DIAGNOSTIC LAPAROSCOPY     IR CT HEAD LTD  08/12/2022   IR PERCUTANEOUS ART THROMBECTOMY/INFUSION INTRACRANIAL INC DIAG ANGIO  08/12/2022   IR US  GUIDE VASC ACCESS RIGHT  08/12/2022   IR US  GUIDE VASC ACCESS RIGHT  08/12/2022   JOINT REPLACEMENT     KNEE ARTHROSCOPY Right 12/09/2012   Procedure: ARTHROSCOPIC IRRIGATION AND DEBRIDEMENT RIGHT KNEE;  Surgeon: Ozell VEAR Bruch, MD;  Location: [facility] OR;  Service: Orthopedics;  Laterality: Right;   LEFT HEART CATH AND CORONARY ANGIOGRAPHY N/A 09/02/2016   Procedure: Left Heart Cath and Coronary Angiography;  Surgeon: Levern Hutching, MD;  Location: [facility] INVASIVE CV LAB;  Service: Cardiovascular;  Laterality: N/A;   RADIOLOGY WITH  ANESTHESIA N/A 08/11/2022   Procedure: IR WITH ANESTHESIA;  Surgeon: Dolphus Carrion, MD;  Location: [facility] OR;  Service: Radiology;  Laterality: N/A;   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/06/2014   Procedure: PLACEMENT OF CEMENT SPACER;  Surgeon: Toribio JULIANNA Chancy, MD;  Location: [facility] OR;  Service: Orthopedics;  Laterality: Right;   TOTAL KNEE ARTHROPLASTY Bilateral 2011-2012   left-right   TOTAL KNEE REVISION Right 11/06/2014   Procedure: REMOVAL OF TOTAL COMPONENTS, EXTENSIVE  IRRIGATION AND DEBRIDEMENT  RIGHT KNEE;  Surgeon: Toribio JULIANNA Chancy, MD;  Location: [facility] OR;  Service: Orthopedics;  Laterality: Right;   TOTAL KNEE REVISION Right 07/02/2015   TOTAL KNEE REVISION Right 07/02/2015   Procedure: TOTAL KNEE REVISION;  Surgeon: Toribio JULIANNA Chancy, MD;  Location: [facility] OR;  Service: Orthopedics;  Laterality: Right;   TUBAL LIGATION  ~ 1980   tummy tuck  1980s   Patient Active Problem List   Diagnosis Date Noted   Chronic bilateral low back pain without sciatica 11/16/2023   Generalized weakness 07/16/2023   Hematoma of groin 08/12/2022   Endotracheal tube present 08/12/2022   AKI (acute kidney injury) 08/12/2022   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/11/2022   Stroke (HCC) 10/06/2017   Acute CVA (cerebrovascular accident) (HCC) 10/06/2017   Trigger thumb, right thumb 05/03/2016   S/P  revision of total knee 07/02/2015   Protein-calorie malnutrition 11/15/2014   Osteomyelitis of right knee region [facility]) 11/08/2014   Complication of device 11/04/2014   Septic arthritis (HCC) 11/04/2014   S/P right knee arthroscopy    UTI (urinary tract infection) 12/09/2012   Infection of prosthetic knee joint 12/09/2012   Hyperlipidemia    Hypertension     PCP: Montie Pizza MD  REFERRING PROVIDER: Artist Lloyd MD  REFERRING DIAG: M54.16 (ICD-10-CM) - Lumbar radiculopathy   Rationale for Evaluation and Treatment: Rehabilitation  THERAPY DIAG:  Other low back pain  Difficulty in walking, not elsewhere  classified  Muscle weakness (generalized)  Bilateral chronic knee pain  ONSET DATE: chronic  SUBJECTIVE:                                                                                                                                                                                           SUBJECTIVE STATEMENT: Pt reports she did ok after last session.  The water feels so good.   She requested a WC to her car for assistance getting into clinic today.     Initial Subjective I have tried everything.  I just need my back and knees to be better so I can get around.  I don't want anymore shots.  Living with dtr.  I cook and do laundry.  Keep myself busy.  No steps  PERTINENT HISTORY:  Dr Lloyd 10/1: chronic low back pain primarily due to facet degeneration and overall degenerative changes lumbar spine along with muscle weakness and spasm. She is already had good conservative management trials including multiple different back procedures which have not helped.   . . .seen pain management and had multiple back injections and no longer wants any further back injections  History of a stroke. Lymphedema bilateral lower extremities.  PAIN:  Are you having pain? Yes: NPRS scale: current 5/10 Pain location: across back  Pain description: sharp Aggravating factors: standing 5 mins Relieving factors: sitting/resting  Bilat knee pain 1/5  PRECAUTIONS: Fall  RED FLAGS: None   WEIGHT BEARING RESTRICTIONS: No  FALLS:  Has patient fallen in last 6 months? No  LIVING ENVIRONMENT: Lives with: lives with their daughter Lives in: House/apartment Stairs: No Has following equipment at home: Single point cane and Shower bench  OCCUPATION: retired/disability  PLOF: Independent with household mobility with device  PATIENT GOALS: improve pain in knees and back, improve walking, get stronger  NEXT MD VISIT: ~ 3 months  OBJECTIVE:  Note: Objective measures were completed at Evaluation  unless otherwise noted.  DIAGNOSTIC FINDINGS:  10/25 x-ray R Knee IMPRESSION: 1. Status post  bilateral total knee arthroplasty, with hinged long-stem arthroplasty on the right. No evidence of perihardware fracture or component malpositioning. 2. Diffuse soft tissue edema of the included lower extremities.  10/25 x-ray L IMPRESSION: 1. Status post bilateral total knee arthroplasty, with hinged long-stem arthroplasty on the right. No evidence of perihardware fracture or component malpositioning. 2. Diffuse soft tissue edema of the included lower extremities.  8/25 x-ray Lumbar  IMPRESSION: 1. No acute fracture or traumatic malalignment. 2. Multilevel spondylosis with moderate disc space height loss at L5-S1 and moderate-advanced facet arthropathy from L3-S1.  8/25 x-ray bilateral ankle IMPRESSION:  Multifocal osteoarthritis.   PATIENT SURVEYS:  ODI: 20/50= 40%   COGNITION: Overall cognitive status: Within functional limits for tasks assessed    EDEMA: bilat LE lymphedema  POSTURE: appears wfl; difficult to assess due to body habitus  PALPATION: Moderate TPP Right thoracic paraspinals  LUMBAR ROM:   AROM eval  Flexion full  Extension 50% limited P!  Right lateral flexion 50% limited P!  Left lateral flexion 50% limited P!  Right rotation   Left rotation    (Blank rows = not tested)  LOWER EXTREMITY ROM:     Knee rom limited due to LE lymphedema  LOWER EXTREMITY MMT:    MMT Right eval Left eval  Hip flexion 3+ 3+  Hip extension    Hip abduction 5 5  Hip adduction    Hip internal rotation    Hip external rotation    Knee flexion 4 4  Knee extension    Ankle dorsiflexion 5 5  Ankle plantarflexion 5 5  Ankle inversion    Ankle eversion     (Blank rows = not tested)    FUNCTIONAL TESTS:  Timed up and go (TUG): 25.73       Item Test date: 12/29/23 Date:  Date:   Sitting to standing 2. able to stand using hands after several tries Insert  SmartPhrase OPRCBERGREEVAL Insert SmartPhrase OPRCBERGREEVAL  2. Standing unsupported 4. able to stand safely for 2 minutes    3. Sitting with back unsupported, feet supported 4. able to sit safely and securely for 2 minutes    4. Standing to sitting 2. uses back of legs against chair to control descent    5. Pivot transfer  3. able to transfer safely with definite need of hands    6. Standing unsupported with eyes closed 3. able to stand 10 seconds with supervision    7. Standing unsupported with feet together 2. able to place feet together independently but unable to hold for 30 seconds    8. Reaching forward with outstretched arms while standing 3. can reach forward 12 cm (5 inches)    9. Pick up object from the floor from standing 2. unable to pick up but reaches 2-5 cm (1-2 inches) from slipper and keeps balance independently    10. Turning to look behind over left and right shoulders while standing 2. turns sideways but only maintains balance    11. Turn 360 degrees 2. able to turn 360 degrees safely but slowly    12. Place alternate foot on step or stool while standing unsupported 0. needs assistance to keep from falling/unable to try    13. Standing unsupported one foot in front 0. loses balance while stepping or standing    14. Standing on one leg 0. unable to try of needs assist to prevent fall      Total Score 29/56 Total Score:    Total  Score:      GAIT: Distance walked: 20 ft Assistive device utilized: Single point cane Level of assistance: Modified independence Comments: lateral displacement  R and L/slight hip hiking and reduced knee flex with swing. Antalgic  TREATMENT  [facility] Adult PT Treatment:                                                DATE: 01/10/24 Pt seen for aquatic therapy today.  Treatment took place in water 3.5-4.75 ft in depth at the [facility] pool. Temp of water was 91.  Pt entered the pool via lift chair; exited via step through pattern on stairs  with bil UE on rails.   *seated on lift: alternating LAQ *VC for STS from lift submerged * UE on yellow hand floats:  walking forward/ backward/ side stepping 2 laps each in 4 ft * arm add/abdct with rainbow hand floats x 10 * TrA set with long hollow noodle pull down to thighs x 10 *UE support on wall: heel raises x 12;  hip add/abdct x 10; alternating hamstring curls x 10 *return to walking forward/backward with yellow hand floats and arm horz abdct/ add   Pt requires the buoyancy and hydrostatic pressure of water for support, and to offload joints by unweighting joint load by at least 50 % in navel deep water and by at least 75-80% in chest to neck deep water.  Viscosity of the water is needed for resistance of strengthening. Water current perturbations provides challenge to standing balance requiring increased core activation.                                                                                                                                  PATIENT EDUCATION:  Education details: intro to aquatic therapy   Person educated: Patient Education method: Explanation Education comprehension: verbalized understanding  HOME EXERCISE PROGRAM: TBA  ASSESSMENT:  CLINICAL IMPRESSION: Pt reported gradual reduction of lower back pain while submerged 70% in water with exercise.  She was able to complete exercises in 68ft depth without difficulty.   She remains a good candidate for aquatic intervention and will benefit from the properties of water to progress towards functional goals, however will need to arrange for caregiver to assist her getting from car to/from pool area. * Of note: Pt has 2 visible wounds on Rt lower posterior leg ~ size of quarter and dime.  She has contacted her dermatologist and reports she okayed her getting into pool.  Will plan on covering wounds with Tegaderm prior to entering pool. Ronal Kem) Ziemba MPT   Initial Impression Patient is a 73 y.o. f who  was seen today for physical therapy evaluation and treatment for LBP. She has a long hx of back as well as bilat knee pain  consistent with OA which have not responded positively to other interventions including land PT. She has further complications with mobility due to bilat LE lymphedema. She presents with pain limited deficits in  LB >knees  in ROM, strength, endurance, activity tolerance, gait, balance, and functional mobility with ADL's. Patients goals are focused on recovering improved level of function to improve QOL. She is a good candidate for skilled aquatic PT to improve all deficit, introduce and instruct on the using the properties of water for pain management, improving balance and strength, improving function.    OBJECTIVE IMPAIRMENTS: Abnormal gait, decreased activity tolerance, decreased balance, decreased knowledge of use of DME, decreased mobility, difficulty walking, decreased ROM, decreased strength, increased edema, postural dysfunction, obesity, and pain.   ACTIVITY LIMITATIONS: carrying, lifting, bending, standing, squatting, stairs, transfers, and caring for others  PARTICIPATION LIMITATIONS: meal prep, cleaning, laundry, shopping, community activity, and yard work  PERSONAL FACTORS: Past/current experiences, Time since onset of injury/illness/exacerbation, and 1-2 comorbidities: See PmHx are also affecting patient's functional outcome.   REHAB POTENTIAL: Good  CLINICAL DECISION MAKING: Stable/uncomplicated  EVALUATION COMPLEXITY: Low   GOALS: Goals reviewed with patient? Yes  SHORT TERM GOALS: Target date: 01/28/24  Pt will tolerate full aquatic sessions consistently without increase in pain and with improving function to demonstrate good toleration and effectiveness of intervention.  Baseline: Goal status: INITIAL  2.  Pt will consider gaining pool access for use of the properties of water for chronic conditions maintaining mobility and minimizing  pain. Baseline:  Goal status: INITIAL  3.  Pt will tolerate walking to and/or from setting using AD as needed and engaging in aquatic therapy session without excessive fatigue or increase in pain to demonstrate improved toleration to activity. Baseline:  Goal status: INITIAL  4.  Pt will report a reduction in pain while submerged by at least 75% to demonstrate how the properties of water manage pain Baseline:  Goal status: INITIAL    LONG TERM GOALS: Target date: 02/24/23  Pt to improve on ODI by 13  % to demonstrate statistically significant Improvement in function. (MCID 13-15%) Baseline: ODI: 20/50= 40% Goal status: INITIAL  2.  Pt will improve on Berg balance test to >/= 40/56 to demonstrate a decrease in fall risk. (MDC 7) Baseline: 29/56 Goal status: INITIAL  3.  Pt will improve strength in hip flex by at least 1 grade to demonstrate improved overall physical function Baseline:  Goal status: INITIAL  4.  Pt will report decrease in pain by at least 50% for improved toleration to activity/quality of life and to demonstrate improved management of pain. Baseline:  Goal status: INITIAL  5.  Pt will report ability to stand (and cook) for up to 15 minutes Baseline:  Goal status: INITIAL  6.  Pt will be indep with final HEP for continued management of condition Baseline:  Goal status: INITIAL  PLAN:  PT FREQUENCY: 2x/week  PT DURATION: 8 weeks  PLANNED INTERVENTIONS: [id number]- PT Re-evaluation, 97750- Physical Performance Testing, 97110-Therapeutic exercises, 97530- Therapeutic activity, 97112- Neuromuscular re-education, 97535- Self Care, [id number]- Manual therapy, [id number]- Gait training, [phone]- Aquatic Therapy, [phone]- Electrical stimulation (unattended), [phone]- Electrical stimulation (manual), [id number]- Ionotophoresis 4mg /ml Dexamethasone , 79439 (1-2 muscles), [id number] (3+ muscles)- Dry Needling, Patient/Family education, Balance training, Stair training, Taping, Joint mobilization, DME  instructions, Cryotherapy, and Moist heat.  PLAN FOR NEXT SESSION: aquatic only: le and core strengthening/stretching/ROM; balance and gait retraining, stair climbing; HEP; pool access  [employer], PTA 01/10/24 2:01 PM [place]  [facility] [address] [place], [place], [address] Phone: [phone]   Fax:  [phone]  Phone: [phone]   Fax:  [phone]   Date of referral: 11/16/23 Referring provider: Artist Lloyd MD Referring diagnosis? M54.16 (ICD-10-CM) - Lumbar radiculopathy  Treatment diagnosis? (if different than referring diagnosis) no  What was this (referring dx) caused by? Ongoing Issue and Arthritis  Lysle of Condition: Chronic (continuous duration > 3 months)   Laterality: Both  Current Functional Measure Score: Other ODI20/50= 40%  Objective measurements identify impairments when they are compared to normal values, the uninvolved extremity, and prior level of function.  [x]  Yes  []  No  Objective assessment of functional ability: Moderate functional limitations   Briefly describe symptoms: chronic LBP and knee pain limiting all functional mobility, balance/safety with Adl's   How did symptoms start: OA/chronic  Average pain intensity:  Last 24 hours: 5/10  Past week: 5/10  How often does the pt experience symptoms? Constantly  How much have the symptoms interfered with usual daily activities? Quite a bit  How has condition changed since care began at this facility? NA - initial visit  In general, how is the patients overall health? Good   BACK PAIN (STarT Back Screening Tool) Has pain spread down the leg(s) at some time in the last 2 weeks? no Has there been pain in the shoulder or neck at some time in the last 2 weeks? no Has the pt only walked short distances because of back pain? yes Has patient dressed more slowly because of back pain in the past 2 weeks? yes Does patient think it's not  safe for a person with this condition to be physically active? no Does patient have worrying thoughts a lot of the time? no Does patient feel back pain is terrible and will never get any better? yes Has patient stopped enjoying things they usually enjoy? yes

## 2024-01-17 ENCOUNTER — Ambulatory Visit (HOSPITAL_BASED_OUTPATIENT_CLINIC_OR_DEPARTMENT_OTHER): Admitting: Physical Therapy

## 2024-01-17 ENCOUNTER — Encounter (HOSPITAL_BASED_OUTPATIENT_CLINIC_OR_DEPARTMENT_OTHER): Payer: Self-pay | Admitting: Physical Therapy

## 2024-01-17 DIAGNOSIS — M25561 Pain in right knee: Secondary | ICD-10-CM | POA: Insufficient documentation

## 2024-01-17 DIAGNOSIS — M5459 Other low back pain: Secondary | ICD-10-CM | POA: Insufficient documentation

## 2024-01-17 DIAGNOSIS — R262 Difficulty in walking, not elsewhere classified: Secondary | ICD-10-CM | POA: Insufficient documentation

## 2024-01-17 DIAGNOSIS — M25562 Pain in left knee: Secondary | ICD-10-CM | POA: Insufficient documentation

## 2024-01-17 DIAGNOSIS — M6281 Muscle weakness (generalized): Secondary | ICD-10-CM | POA: Diagnosis present

## 2024-01-17 DIAGNOSIS — G8929 Other chronic pain: Secondary | ICD-10-CM | POA: Insufficient documentation

## 2024-01-17 NOTE — Therapy (Signed)
 OUTPATIENT PHYSICAL THERAPY THORACOLUMBAR TREATMENT   Patient Name: [name] [name] MRN: [id number] DOB:07-Dec-1950, 73 y.o., female Today's Date: 01/17/2024  END OF SESSION:  PT End of Session - 01/17/24 0949     Visit Number 4    Date for Recertification  02/24/24    Authorization Type uhc mcr    PT Start Time 0930    PT Stop Time 1015    PT Time Calculation (min) 45 min    Activity Tolerance Patient tolerated treatment well    Behavior During Therapy [facility] for tasks assessed/performed          Past Medical History:  Diagnosis Date   Arthritis    right knee (07/02/2015)   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   Diabetes (HCC)    type II   Hyperlipidemia    Hypertension    Obesity    OSA (obstructive sleep apnea)    Overactive bladder    S/P right knee arthroscopy    with septic joint growing out MSSA   Septic arthritis of knee, right (HCC)    Wears glasses    Past Surgical History:  Procedure Laterality Date   BIOPSY  08/05/2020   Procedure: BIOPSY;  Surgeon: Elicia Claw, MD;  Location: [facility];  Service: Gastroenterology;;   COLONOSCOPY     COLONOSCOPY WITH PROPOFOL  N/A 08/05/2020   Procedure: COLONOSCOPY WITH PROPOFOL ;  Surgeon: Elicia Claw, MD;  Location: [facility];  Service: Gastroenterology;  Laterality: N/A;   DIAGNOSTIC LAPAROSCOPY     IR CT HEAD LTD  08/12/2022   IR PERCUTANEOUS ART THROMBECTOMY/INFUSION INTRACRANIAL INC DIAG ANGIO  08/12/2022   IR US  GUIDE VASC ACCESS RIGHT  08/12/2022   IR US  GUIDE VASC ACCESS RIGHT  08/12/2022   JOINT REPLACEMENT     KNEE ARTHROSCOPY Right 12/09/2012   Procedure: ARTHROSCOPIC IRRIGATION AND DEBRIDEMENT RIGHT KNEE;  Surgeon: Ozell VEAR Bruch, MD;  Location: [facility] OR;  Service: Orthopedics;  Laterality: Right;   LEFT HEART CATH AND CORONARY ANGIOGRAPHY N/A 09/02/2016   Procedure: Left Heart Cath and Coronary Angiography;  Surgeon: Levern Hutching, MD;  Location: [facility] INVASIVE CV LAB;   Service: Cardiovascular;  Laterality: N/A;   RADIOLOGY WITH ANESTHESIA N/A 08/11/2022   Procedure: IR WITH ANESTHESIA;  Surgeon: Dolphus Carrion, MD;  Location: [facility] OR;  Service: Radiology;  Laterality: N/A;   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/06/2014   Procedure: PLACEMENT OF CEMENT SPACER;  Surgeon: Toribio JULIANNA Chancy, MD;  Location: [facility] OR;  Service: Orthopedics;  Laterality: Right;   TOTAL KNEE ARTHROPLASTY Bilateral 2011-2012   left-right   TOTAL KNEE REVISION Right 11/06/2014   Procedure: REMOVAL OF TOTAL COMPONENTS, EXTENSIVE  IRRIGATION AND DEBRIDEMENT  RIGHT KNEE;  Surgeon: Toribio JULIANNA Chancy, MD;  Location: [facility] OR;  Service: Orthopedics;  Laterality: Right;   TOTAL KNEE REVISION Right 07/02/2015   TOTAL KNEE REVISION Right 07/02/2015   Procedure: TOTAL KNEE REVISION;  Surgeon: Toribio JULIANNA Chancy, MD;  Location: [facility] OR;  Service: Orthopedics;  Laterality: Right;   TUBAL LIGATION  ~ 1980   tummy tuck  1980s   Patient Active Problem List   Diagnosis Date Noted   Chronic bilateral low back pain without sciatica 11/16/2023   Generalized weakness 07/16/2023   Hematoma of groin 08/12/2022   Endotracheal tube present 08/12/2022   AKI (acute kidney injury) 08/12/2022   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/11/2022   Stroke (HCC) 10/06/2017   Acute CVA (cerebrovascular accident) (HCC) 10/06/2017  Trigger thumb, right thumb 05/03/2016   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   Protein-calorie malnutrition 11/15/2014   Osteomyelitis of right knee region [facility]) 11/08/2014   Complication of device 11/04/2014   Septic arthritis (HCC) 11/04/2014   S/P right knee arthroscopy    UTI (urinary tract infection) 12/09/2012   Infection of prosthetic knee joint 12/09/2012   Hyperlipidemia    Hypertension     PCP: Montie Pizza MD  REFERRING PROVIDER: Artist Lloyd MD  REFERRING DIAG: M54.16 (ICD-10-CM) - Lumbar radiculopathy   Rationale for Evaluation and Treatment: Rehabilitation  THERAPY DIAG:   Other low back pain  Difficulty in walking, not elsewhere classified  Muscle weakness (generalized)  Bilateral chronic knee pain  ONSET DATE: chronic  SUBJECTIVE:                                                                                                                                                                                           SUBJECTIVE STATEMENT: Pt reports she did ok after last session.  The water feels so good.   She requested a WC to her car for assistance getting into clinic today.     Initial Subjective I have tried everything.  I just need my back and knees to be better so I can get around.  I don't want anymore shots.  Living with dtr.  I cook and do laundry.  Keep myself busy.  No steps  PERTINENT HISTORY:  Dr Lloyd 10/1: chronic low back pain primarily due to facet degeneration and overall degenerative changes lumbar spine along with muscle weakness and spasm. She is already had good conservative management trials including multiple different back procedures which have not helped.   . . .seen pain management and had multiple back injections and no longer wants any further back injections  History of a stroke. Lymphedema bilateral lower extremities.  PAIN:  Are you having pain? Yes: NPRS scale: current 5/10 Pain location: across back  Pain description: sharp Aggravating factors: standing 5 mins Relieving factors: sitting/resting  Bilat knee pain 1/5  PRECAUTIONS: Fall  RED FLAGS: None   WEIGHT BEARING RESTRICTIONS: No  FALLS:  Has patient fallen in last 6 months? No  LIVING ENVIRONMENT: Lives with: lives with their daughter Lives in: House/apartment Stairs: No Has following equipment at home: Single point cane and Shower bench  OCCUPATION: retired/disability  PLOF: Independent with household mobility with device  PATIENT GOALS: improve pain in knees and back, improve walking, get stronger  NEXT MD VISIT: ~ 3  months  OBJECTIVE:  Note: Objective measures were completed at Evaluation unless otherwise noted.  DIAGNOSTIC FINDINGS:  10/25 x-ray R Knee IMPRESSION: 1. Status post bilateral total knee arthroplasty, with hinged long-stem arthroplasty on the right. No evidence of perihardware fracture or component malpositioning. 2. Diffuse soft tissue edema of the included lower extremities.  10/25 x-ray L IMPRESSION: 1. Status post bilateral total knee arthroplasty, with hinged long-stem arthroplasty on the right. No evidence of perihardware fracture or component malpositioning. 2. Diffuse soft tissue edema of the included lower extremities.  8/25 x-ray Lumbar  IMPRESSION: 1. No acute fracture or traumatic malalignment. 2. Multilevel spondylosis with moderate disc space height loss at L5-S1 and moderate-advanced facet arthropathy from L3-S1.  8/25 x-ray bilateral ankle IMPRESSION:  Multifocal osteoarthritis.   PATIENT SURVEYS:  ODI: 20/50= 40%   COGNITION: Overall cognitive status: Within functional limits for tasks assessed    EDEMA: bilat LE lymphedema  POSTURE: appears wfl; difficult to assess due to body habitus  PALPATION: Moderate TPP Right thoracic paraspinals  LUMBAR ROM:   AROM eval  Flexion full  Extension 50% limited P!  Right lateral flexion 50% limited P!  Left lateral flexion 50% limited P!  Right rotation   Left rotation    (Blank rows = not tested)  LOWER EXTREMITY ROM:     Knee rom limited due to LE lymphedema  LOWER EXTREMITY MMT:    MMT Right eval Left eval  Hip flexion 3+ 3+  Hip extension    Hip abduction 5 5  Hip adduction    Hip internal rotation    Hip external rotation    Knee flexion 4 4  Knee extension    Ankle dorsiflexion 5 5  Ankle plantarflexion 5 5  Ankle inversion    Ankle eversion     (Blank rows = not tested)    FUNCTIONAL TESTS:  Timed up and go (TUG): 25.73       Item Test date: 12/29/23 Date:  Date:   Sitting  to standing 2. able to stand using hands after several tries Insert SmartPhrase OPRCBERGREEVAL Insert SmartPhrase OPRCBERGREEVAL  2. Standing unsupported 4. able to stand safely for 2 minutes    3. Sitting with back unsupported, feet supported 4. able to sit safely and securely for 2 minutes    4. Standing to sitting 2. uses back of legs against chair to control descent    5. Pivot transfer  3. able to transfer safely with definite need of hands    6. Standing unsupported with eyes closed 3. able to stand 10 seconds with supervision    7. Standing unsupported with feet together 2. able to place feet together independently but unable to hold for 30 seconds    8. Reaching forward with outstretched arms while standing 3. can reach forward 12 cm (5 inches)    9. Pick up object from the floor from standing 2. unable to pick up but reaches 2-5 cm (1-2 inches) from slipper and keeps balance independently    10. Turning to look behind over left and right shoulders while standing 2. turns sideways but only maintains balance    11. Turn 360 degrees 2. able to turn 360 degrees safely but slowly    12. Place alternate foot on step or stool while standing unsupported 0. needs assistance to keep from falling/unable to try    13. Standing unsupported one foot in front 0. loses balance while stepping or standing    14. Standing on one leg 0. unable to try of needs assist to prevent fall      Total  Score 29/56 Total Score:    Total Score:      GAIT: Distance walked: 20 ft Assistive device utilized: Single point cane Level of assistance: Modified independence Comments: lateral displacement  R and L/slight hip hiking and reduced knee flex with swing. Antalgic  TREATMENT  [facility] Adult PT Treatment:                                                DATE: 01/17/24 Pt seen for aquatic therapy today.  Treatment took place in water 3.5-4.75 ft in depth at the [facility] pool. Temp of water was 91.  Pt  entered/exited via step through pattern on stairs with bil UE on rails.   *Tegaderm applied to rle wounds *walking forward/backward/side stepping with yellow hand floats *side stepping with yellow hand floats and arm horz abdct/ add  *seated on lift: alternating LAQ  * UE on yellow hand floats:  walking forward/ backward/ side stepping 2 laps each in 4 ft * UE support on wall: heel raises x 12;  hip add/abdct x 10; alternating hamstring curls x 10 * TrA set with long hollow noodle pull down to thighs x 10 wide stance then staggered * *return to   Pt requires the buoyancy and hydrostatic pressure of water for support, and to offload joints by unweighting joint load by at least 50 % in navel deep water and by at least 75-80% in chest to neck deep water.  Viscosity of the water is needed for resistance of strengthening. Water current perturbations provides challenge to standing balance requiring increased core activation.                                                                                                                                  PATIENT EDUCATION:  Education details: intro to aquatic therapy   Person educated: Patient Education method: Explanation Education comprehension: verbalized understanding  HOME EXERCISE PROGRAM: TBA  ASSESSMENT:  CLINICAL IMPRESSION: Pt arrives in [place].  After covering wound in Tegaderm and entering pool pt reports her dermatologist has not seen her wounds, a miscommunication as a picture was taken and emailed to her and pt reported that she sent it ito the MD as requested for clearance to participate in aquatic therapy. MD not in [facility] system. She is instructed today that she is required to bring written clearance  (or can be verbal) from MD before returning for next appt. Tegaderm bandage did not remain intact as session progressed. Pt able to enter and exit pool via stairs today with good tolerance.  Cuing for step to pattern and safety.  Goals ongoing  Last session * Of note: Pt has 2 visible wounds on Rt lower posterior leg ~ size of quarter and dime.  She has contacted her dermatologist and reports  she okayed her getting into pool.  Will plan on covering wounds with Tegaderm prior to entering pool. Ronal Kem) Laiya Wisby MPT   Initial Impression Patient is a 73 y.o. f who was seen today for physical therapy evaluation and treatment for LBP. She has a long hx of back as well as bilat knee pain consistent with OA which have not responded positively to other interventions including land PT. She has further complications with mobility due to bilat LE lymphedema. She presents with pain limited deficits in  LB >knees  in ROM, strength, endurance, activity tolerance, gait, balance, and functional mobility with ADL's. Patients goals are focused on recovering improved level of function to improve QOL. She is a good candidate for skilled aquatic PT to improve all deficit, introduce and instruct on the using the properties of water for pain management, improving balance and strength, improving function.    OBJECTIVE IMPAIRMENTS: Abnormal gait, decreased activity tolerance, decreased balance, decreased knowledge of use of DME, decreased mobility, difficulty walking, decreased ROM, decreased strength, increased edema, postural dysfunction, obesity, and pain.   ACTIVITY LIMITATIONS: carrying, lifting, bending, standing, squatting, stairs, transfers, and caring for others  PARTICIPATION LIMITATIONS: meal prep, cleaning, laundry, shopping, community activity, and yard work  PERSONAL FACTORS: Past/current experiences, Time since onset of injury/illness/exacerbation, and 1-2 comorbidities: See PmHx are also affecting patient's functional outcome.   REHAB POTENTIAL: Good  CLINICAL DECISION MAKING: Stable/uncomplicated  EVALUATION COMPLEXITY: Low   GOALS: Goals reviewed with patient? Yes  SHORT TERM GOALS: Target date: 01/28/24  Pt  will tolerate full aquatic sessions consistently without increase in pain and with improving function to demonstrate good toleration and effectiveness of intervention.  Baseline: Goal status: INITIAL  2.  Pt will consider gaining pool access for use of the properties of water for chronic conditions maintaining mobility and minimizing pain. Baseline:  Goal status: INITIAL  3.  Pt will tolerate walking to and/or from setting using AD as needed and engaging in aquatic therapy session without excessive fatigue or increase in pain to demonstrate improved toleration to activity. Baseline:  Goal status: INITIAL  4.  Pt will report a reduction in pain while submerged by at least 75% to demonstrate how the properties of water manage pain Baseline:  Goal status: INITIAL    LONG TERM GOALS: Target date: 02/24/23  Pt to improve on ODI by 13  % to demonstrate statistically significant Improvement in function. (MCID 13-15%) Baseline: ODI: 20/50= 40% Goal status: INITIAL  2.  Pt will improve on Berg balance test to >/= 40/56 to demonstrate a decrease in fall risk. (MDC 7) Baseline: 29/56 Goal status: INITIAL  3.  Pt will improve strength in hip flex by at least 1 grade to demonstrate improved overall physical function Baseline:  Goal status: INITIAL  4.  Pt will report decrease in pain by at least 50% for improved toleration to activity/quality of life and to demonstrate improved management of pain. Baseline:  Goal status: INITIAL  5.  Pt will report ability to stand (and cook) for up to 15 minutes Baseline:  Goal status: INITIAL  6.  Pt will be indep with final HEP for continued management of condition Baseline:  Goal status: INITIAL  PLAN:  PT FREQUENCY: 2x/week  PT DURATION: 8 weeks  PLANNED INTERVENTIONS: 97164- PT Re-evaluation, 97750- Physical Performance Testing, 97110-Therapeutic exercises, 97530- Therapeutic activity, [id number]- Neuromuscular re-education, 97535- Self Care,  97140- Manual therapy, [id number]- Gait training, 02886- Aquatic Therapy, [phone]- Electrical stimulation (unattended), [phone]- Electrical stimulation (  manual), 02966- Ionotophoresis 4mg /ml Dexamethasone , 20560 (1-2 muscles), 20561 (3+ muscles)- Dry Needling, Patient/Family education, Balance training, Stair training, Taping, Joint mobilization, DME instructions, Cryotherapy, and Moist heat.  PLAN FOR NEXT SESSION: aquatic only: le and core strengthening/stretching/ROM; balance and gait retraining, stair climbing; HEP; pool access  [place], PTA 01/17/24 9:51 AM [facility] [address] [place], [place], [address] Phone: [phone]   Fax:  [phone]  Phone: [phone]   Fax:  [phone]   Date of referral: 11/16/23 Referring provider: Artist Lloyd MD Referring diagnosis? M54.16 (ICD-10-CM) - Lumbar radiculopathy  Treatment diagnosis? (if different than referring diagnosis) no  What was this (referring dx) caused by? Ongoing Issue and Arthritis  Lysle of Condition: Chronic (continuous duration > 3 months)   Laterality: Both  Current Functional Measure Score: Other ODI20/50= 40%  Objective measurements identify impairments when they are compared to normal values, the uninvolved extremity, and prior level of function.  [x]  Yes  []  No  Objective assessment of functional ability: Moderate functional limitations   Briefly describe symptoms: chronic LBP and knee pain limiting all functional mobility, balance/safety with Adl's   How did symptoms start: OA/chronic  Average pain intensity:  Last 24 hours: 5/10  Past week: 5/10  How often does the pt experience symptoms? Constantly  How much have the symptoms interfered with usual daily activities? Quite a bit  How has condition changed since care began at this facility? NA - initial visit  In general, how is the patients overall health? Good   BACK PAIN (STarT  Back Screening Tool) Has pain spread down the leg(s) at some time in the last 2 weeks? no Has there been pain in the shoulder or neck at some time in the last 2 weeks? no Has the pt only walked short distances because of back pain? yes Has patient dressed more slowly because of back pain in the past 2 weeks? yes Does patient think it's not safe for a person with this condition to be physically active? no Does patient have worrying thoughts a lot of the time? no Does patient feel back pain is terrible and will never get any better? yes Has patient stopped enjoying things they usually enjoy? yes

## 2024-01-19 ENCOUNTER — Ambulatory Visit (HOSPITAL_BASED_OUTPATIENT_CLINIC_OR_DEPARTMENT_OTHER): Admitting: Physical Therapy

## 2024-01-19 ENCOUNTER — Telehealth (HOSPITAL_BASED_OUTPATIENT_CLINIC_OR_DEPARTMENT_OTHER): Payer: Self-pay | Admitting: Physical Therapy

## 2024-01-19 NOTE — Telephone Encounter (Signed)
 Patient called front desk of Outpatient Rehab inquiring if a letter from her Dermatologist had been received.  Patient stated that she was told by Dr. Reda Deforest office that a letter was faxed by 12pm. No fax had been received as of 2pm.   Spoke to patient on phone at 2:10pm.  Inquired about her discussion with Dermatologist office and what letter stated.  Patient stated that the letter said she should not be in the water and should follow up in 6 weeks.   Called Dr. Reda Anderson's office to verify above information.  Response from office triage was that they were misinformed about type of physical therapy and that the Dr was unaware it was Aquatic therapy.  She stated that patient cannot be involved in water activities including Aquatic therapy at this time. Triage staff stated they would refax letter to the correct office fax for [facility] and would inform Dr. Reda Piety of the type of PT that patient was scheduled to receive.  Patient does not currently have an appointment with Dr. Piety until June 2026. Pt has been referred to [facility].     [name] [name], PTA 01/19/24 6:18 PM [facility] [address] [place], [place], [address] Phone: [phone]   Fax:  [phone]

## 2024-01-20 ENCOUNTER — Telehealth (HOSPITAL_BASED_OUTPATIENT_CLINIC_OR_DEPARTMENT_OTHER): Payer: Self-pay | Admitting: Physical Therapy

## 2024-01-20 NOTE — Telephone Encounter (Signed)
 Letter received from Dr. Reda Piety via fax today (see media in chart).    Called patient to let her know we had received the letter from her Dermatologist stating that she could not participate in water activities in Physical therapy.  Inquired if patient would like to complete land based PT nstead.  Patient declined to do land based PT at this time, and stated she would seek treatment at wound clinic and for lymphedema at this time.   Informed patient that we would cancel all of her PT appointments. Requested pt get cleared for return to aquatic therapy after her wounds are fully healed, and to reach out to us  to begin PT again. Patient verbalized understanding.   Delon Aquas, PTA 01/20/24 4:33 PM [facility] [address]  [place] [place], [place], [address] Phone: [phone]   Fax:  [phone]

## 2024-01-24 ENCOUNTER — Ambulatory Visit (HOSPITAL_BASED_OUTPATIENT_CLINIC_OR_DEPARTMENT_OTHER): Admitting: Physical Therapy

## 2024-01-27 ENCOUNTER — Ambulatory Visit (HOSPITAL_BASED_OUTPATIENT_CLINIC_OR_DEPARTMENT_OTHER): Admitting: Physical Therapy

## 2024-01-31 ENCOUNTER — Ambulatory Visit (HOSPITAL_BASED_OUTPATIENT_CLINIC_OR_DEPARTMENT_OTHER): Admitting: Physical Therapy

## 2024-02-03 ENCOUNTER — Ambulatory Visit (HOSPITAL_BASED_OUTPATIENT_CLINIC_OR_DEPARTMENT_OTHER): Admitting: Physical Therapy

## 2024-02-07 ENCOUNTER — Ambulatory Visit: Attending: Family Medicine | Admitting: Physical Therapy

## 2024-02-07 ENCOUNTER — Encounter: Payer: Self-pay | Admitting: Physical Therapy

## 2024-02-07 ENCOUNTER — Telehealth: Payer: Self-pay | Admitting: Family Medicine

## 2024-02-07 DIAGNOSIS — R262 Difficulty in walking, not elsewhere classified: Secondary | ICD-10-CM | POA: Diagnosis present

## 2024-02-07 DIAGNOSIS — M6281 Muscle weakness (generalized): Secondary | ICD-10-CM | POA: Insufficient documentation

## 2024-02-07 DIAGNOSIS — M5459 Other low back pain: Secondary | ICD-10-CM | POA: Insufficient documentation

## 2024-02-07 NOTE — Therapy (Addendum)
 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   Diabetes (HCC)    type II   Hyperlipidemia    Hypertension    Obesity    OSA (obstructive sleep apnea)    Overactive bladder    S/P right knee arthroscopy    with septic joint growing out MSSA   Septic arthritis of knee, right (HCC)    Wears glasses    Past Surgical History:  Procedure Laterality Date   BIOPSY  08/05/2020   Procedure: BIOPSY;  Surgeon: Elicia Claw, MD;  Location: [facility];  Service: Gastroenterology;;   COLONOSCOPY     COLONOSCOPY WITH PROPOFOL  N/A 08/05/2020   Procedure: COLONOSCOPY WITH PROPOFOL ;  Surgeon: Elicia Claw, MD;  Location: [facility];  Service: Gastroenterology;  Laterality: N/A;   DIAGNOSTIC LAPAROSCOPY     IR CT HEAD LTD  08/12/2022   IR PERCUTANEOUS ART THROMBECTOMY/INFUSION INTRACRANIAL INC DIAG ANGIO  08/12/2022   IR US  GUIDE VASC ACCESS RIGHT  08/12/2022   IR US  GUIDE VASC ACCESS RIGHT  08/12/2022   JOINT REPLACEMENT     KNEE ARTHROSCOPY Right 12/09/2012   Procedure: ARTHROSCOPIC IRRIGATION AND DEBRIDEMENT RIGHT KNEE;  Surgeon: Ozell VEAR Bruch, MD;  Location: [facility] OR;  Service: Orthopedics;  Laterality: Right;   LEFT HEART CATH AND CORONARY ANGIOGRAPHY N/A 09/02/2016   Procedure: Left Heart Cath and Coronary Angiography;  Surgeon: Levern Hutching, MD;  Location: [facility] INVASIVE CV LAB;  Service:  Cardiovascular;  Laterality: N/A;   RADIOLOGY WITH ANESTHESIA N/A 08/11/2022   Procedure: IR WITH ANESTHESIA;  Surgeon: Dolphus Carrion, MD;  Location: [facility] OR;  Service: Radiology;  Laterality: N/A;   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/06/2014   Procedure: PLACEMENT OF CEMENT SPACER;  Surgeon: Toribio JULIANNA Chancy, MD;  Location: [facility] OR;  Service: Orthopedics;  Laterality: Right;   TOTAL KNEE ARTHROPLASTY Bilateral 2011-2012   left-right   TOTAL KNEE REVISION Right 11/06/2014   Procedure: REMOVAL OF TOTAL COMPONENTS, EXTENSIVE  IRRIGATION AND DEBRIDEMENT  RIGHT KNEE;  Surgeon: Toribio JULIANNA Chancy, MD;  Location: [facility] OR;  Service: Orthopedics;  Laterality: Right;   TOTAL KNEE REVISION Right 07/02/2015   TOTAL KNEE REVISION Right 07/02/2015   Procedure: TOTAL KNEE REVISION;  Surgeon: Toribio JULIANNA Chancy, MD;  Location: [facility] OR;  Service: Orthopedics;  Laterality: Right;   TUBAL LIGATION  ~ 1980   tummy tuck  1980s   Patient Active Problem List   Diagnosis Date Noted   Chronic bilateral low back pain without sciatica 11/16/2023   Generalized weakness 07/16/2023   Hematoma of groin 08/12/2022   Endotracheal tube present 08/12/2022   AKI (acute kidney injury) 08/12/2022   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/11/2022   Stroke (HCC) 10/06/2017   Acute CVA (cerebrovascular accident) (HCC) 10/06/2017  Trigger thumb, right thumb 05/03/2016   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   Protein-calorie malnutrition 11/15/2014   Osteomyelitis of right knee region [facility]) 11/08/2014   Complication of device 11/04/2014   Septic arthritis (HCC) 11/04/2014   S/P right knee arthroscopy    UTI (urinary tract infection) 12/09/2012   Infection of prosthetic knee joint 12/09/2012   Hyperlipidemia    Hypertension     PCP: Teresa Channel, MD  REFERRING PROVIDER: Teresa Channel, MD  REFERRING DIAG: Lumbar Radiculopathy  THERAPY DIAG:  Difficulty in walking, not elsewhere classified  Other low back pain  Muscle  weakness (generalized)  ONSET DATE: July 2024 for CVA  Rationale for Evaluation and Treatment: Habilitation  SUBJECTIVE:                                                                                                                                                                                           SUBJECTIVE STATEMENT: Pt presents for power wheelchair eval - is using clinic's manual wheelchair to access clinic from her car; pt has severe lymphedema in bil. LE's  PERTINENT HISTORY:  Per chart note [name] [name] is a 73 y.o. female with Pmhx of DM, COPD, OSA, HTN, heart failure, cervical and lumbar radiculopathy, and left MCA stroke s/p thrombectomy in June 2024 (Lead). 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 Yes: NPRS scale: 8/10 Pain location: back pain Pain description: throbbing, sharp at times Aggravating factors: standing or walking Relieving factors: sitting  PRECAUTIONS: Fall  WEIGHT BEARING RESTRICTIONS: No  FALLS:  Has patient fallen in last 6 months? No  LIVING ENVIRONMENT: Lives with: lives with their daughter Lives in: House/apartment Stairs: Yes: External: 1 steps; none Has following equipment at home: Wheelchair (power) and [employer]  OCCUPATION: retired  PLOF: Independent with basic ADLs, Independent with household mobility with device, Independent with homemaking with wheelchair, and Needs assistance with homemaking  PATIENT GOALS: obtain new power wheelchair   PATIENT INFORMATION: This Evaluation form will serve as the LMN for the following suppliers:  Supplier:  [employer] Contact Person:  Sidra Brett HOOK  Phone:  [phone]   Reason for Referral: Patient/caregiver Goals: Patient was seen for face-to-face evaluation for new power wheelchair.  Also present was  Josh Cadle, ATP with [employer], to discuss recommendations and wheelchair options.  Further paperwork was  completed and sent to vendor.  Patient appears to qualify for power mobility device at this time per objective findings.   MEDICAL HISTORY: Diagnosis: Primary Diagnosis Onset:  Primary osteoarthritis involving multiple  joints;  Morbid obesity:  Chronic diastolic (congestive) heart failure; s/p CVA; chronic bilateral low back pain without sciatica; Lymphedema in bil. LE's  [] Progressive Disease Relevant Past and Future Surgeries: pt had Rt TKA 2011; had infection so total knee revision performed in Sept. 2016 and in May 2017; Lt heart cath & coronary angiography (July 2018) Height:  5'4 Weight:  305# Explain and recent changes or trends in weight:   Relevant History including falls: Pt reports no falls; pt reports she is able to amb. Short household distances with use of walls/furniture for assist with balance; pt presents for wheelchair eval in clinic's manual wheelchair.  Pt has severe lymphedema in bil. LE's; pt reports she has a power wheelchair that is in disrepair, approx. 72 yrs old - is no longer operable      HOME ENVIRONMENT: [] House  [] Condo/town home  [x] Apartment  [] Assisted Living    [] Lives Alone [x]  Lives with Others                                                    Hours with caregiver: 12+  [x] Home is accessible to patient            Stairs  [x] Yes []  No    1 step only (small threshold)   Ramp [] Yes [x] No Comments:     COMMUNITY ADL: TRANSPORTATION: [x] Car    [] Sports Administrator    [] Adapted w/c Lift   [] Ambulance   [] Other:       [] Sits in wheelchair during transport  Employment/School:     Specific requirements pertaining to mobility                                                     Other:                                       FUNCTIONAL/SENSORY PROCESSING SKILLS:  Handedness:   [] Right     [x] Left    [] NA  Comments:                                 Functional Processing Skills for Wheeled Mobility [x] Processing Skills are adequate for safe  wheelchair operation  Areas of concern than may interfere with safe operation of wheelchair Description of problem   []  Attention to environment     [] Judgment     []  Hearing  []  Vision or visual processing    [] Motor Planning  []  Fluctuations in Behavior                                                   VERBAL COMMUNICATION: [x] WFL receptive [x]  WFL expressive [] Understandable  [] Difficult to understand  [] non-communicative []  Uses an augmented communication device    CURRENT SEATING / MOBILITY: Current Mobility Base:   [] None  [] Dependent  [] Manual  [] Scooter  [x] Power  Type of Control:                       Manufacturer:                         Size:                         Age:   73 yrs old per pt report                        Current Condition of Mobility Base:  In Disrepair                                                                                                                   Current Wheelchair components:                                                                                                                                   Describe posture in present seating system:                                                                            SENSATION and SKIN ISSUES: Sensation [] Intact [x] Impaired [] Absent   Level of sensation:    Pt reports numbness & tingling in bil. LE's with RLE > LLE                       Pressure Relief: Able to perform effective pressure relief :   [x] Yes  []  No Method:                                                                              If not, Why?:  Skin Issues/Skin Integrity Current Skin Issues   [x] Yes [] No  [] Intact []  Red area [x]  Open Area  [x] Scar Tissue [] At risk from prolonged sitting  Where  Rt lateral lower leg (2 open areas) and Lt lateral thigh (scar tissue)                          History of Skin Issues   [] Yes  [x] No  Where                                         When                         Hx of skin flap surgeries [] Yes [x] No  Where                  When                                                  Limited sitting tolerance [] Yes [x] No Hours spent sitting in wheelchair daily:     8+                                                    Complaint of Pain:  Please describe: pain in Rt foot; pt reports chronic back pain constantly (rates pain intensity 8/10 in standing)                                                                                                          Swelling/Edema:   pt has lymphedema in bil. LE's                                                                                                                                         ADL STATUS (in reference to wheelchair use):  Indep Assist Unable Indep with Equip Not assessed Comments  Dressing              X  Needs assist with shoes & socks; dresses from seated position from a chair                  Eating    X                                                                                                                          Toileting                                          X                         Uses elevated commode seat                                                            Bathing             X                                                    Does bed baths only                                                                     Grooming/ Hygiene                                     X                        Performs from wheelchair                                                                 Meal Prep                                      X  Performs from wheelchair                                                            IADLS                       X                                                                                            Bowel Management: [x] Continent  [] Incontinent  [] Accidents Comments:                                                  Bladder Management: [x] Continent  [] Incontinent  [] Accidents Comments:                                              WHEELCHAIR SKILLS: Manual w/c Propulsion: [] UE or LE strength and endurance sufficient to participate in ADLs using manual wheelchair Arm :  [] left [] right  [] Both                                   Foot:   [] left [] right  [] Both  Distance:   Operate Scooter: []  Strength, hand grip, balance and transfer appropriate for use [] Living environment is accessible for use of scooter  Operate Power w/c:  [x]  Std. Joystick   []  Alternative Controls Indep [x]  Assist []  Dependent/ Unable []  N/A []  [x] Safe          [x]  Functional      Distance: 100'             Bed confined without wheelchair []  Yes [x]  No   STRENGTH/RANGE OF MOTION:  Active Range of Motion Strength  Shoulder    Rt shoulder flexion 94 degrees; abdct. = 88 degrees:  Lt shoulder flexion 108; abdct = 102 degrees                                             Bil. Shoulder flexors and abductors 3-/5                                                            Elbow   WFL's bil. UE's  WNL's bil. Elbow flexors & extensors                                                         Wrist/Hand    WFL's bil. UE's                                                                 WFL's bil. UE's                                                                    Hip   WFL's bil. LE's                                                                  3-/5                                                       Knee     Rt knee flexion 70 degrees with pain at end ROM:  Lt knee flexion 84 degrees (limited by lymphedema and OA bil. LE's)                                                                                                                         Ankle WFL's bil. LE's                                                                       MOBILITY/BALANCE:  []  Patient is totally dependent for mobility  Balance Transfers Ambulation  Sitting Balance: Standing Balance: [x]  Independent []  Independent/Modified Independent  [x]  WFL     []  WFL []  Supervision []  Supervision  []  Uses UE for balance  []  Supervision []  Min Assist []  Ambulates with Assist                           []  Min Assist [x]  Min assist []  Mod Assist [x]  Ambulates with Device:  []  RW   []  StW   []  Cane   [x]    Uses walls for assist with balance             []  Mod Assist []  Mod assist []  Max assist   []  Max Assist []  Max assist []  Dependent []  Indep. Short Distance Only  []  Unable []  Unable []  Lift / Sling Required Distance (in feet)   approx. 10'-15'                         []  Sliding board []  Unable to Ambulate: (Explain:  Cardio Status:  [] Intact  [x]  Impaired   []  NA    s/p Lt heart cath & coronary angiography July 2018; has chronic diastolic CHF                    Respiratory Status:  [] Intact   [x] Impaired   [] NA   has OSA                                  Orthotics/Prosthetics:                                                                         Comments (Address manual vs power w/c vs scooter):  Pt is unable to functionally ambulate due to OA, chronic bilateral low back pain, chronic knee pain, morbid obesity, chronic (diastolic) CHF, and severe lymphedema in bil. LE's.  Pt reports she ambulates very short distances in her home, always holding onto walls/furniture for assist with balance. She is unable to functionally propel a manual wheelchair due to decreased bil. shoulder AROM and strength due to OA and also due to CHF and due to her large anatomical size (weight 305#). She is unable to functionally and efficiently propel a manual wheelchair due to her compromised cardiovascular function due to  diagnosis of CHF and also due to her morbid obesity.  She is unable to operate a scooter due to her inability to safely transfer on and off the platform of a scooter and also due to the standard weight limit of a scooter.  She is in need of a group 2 power wheelchair for independence and safety with mobility and ADL's in her home environment.  A power wheelchair would greatly decrease her risk of fall with mobility and ADL's in her home.                                              Anterior / Posterior Obliquity Rotation-Pelvis  PELVIS    [x] Neutral  []  Posterior  []  Anterior     [x] WFL  [] Right Elevated  [] Left Elevated   [x] WFL  [] Right Anterior []   Left Anterior    []  Fixed []  Partly Flexible [x]  Flexible  []  Other  []  Fixed  []  Partly Flexible  [x]  Flexible []  Other  []  Fixed  []  Partly Flexible  [x]  Flexible []  Other  TRUNK [x] WFL [] Thoracic Kyphosis [] Lumbar Lordosis   [x]  WFL [] Convex Right [] Convex Left   [] c-curve [] s-curve [] multiple  [x]  Neutral []  Left-anterior []  Right-anterior    []  Fixed [x]  Flexible []  Partly Flexible       Other  []  Fixed [x]  Flexible []  Partly Flexible []  Other  []  Fixed           [x]  Flexible []  Partly Flexible []  Other   Position Windswept   HIPS  [x]  Neutral []  Abduct []  ADduct [x]  Neutral []  Right []  Left       []  Fixed  []  Partly Flexible             []  Dislocated [x]  Flexible []  Subluxed    []  Fixed []  Partly Flexible  [x]  Flexible []  Other              Foot Positioning Knee Positioning   Knees and  Feet  [x]  WFL [x] Left [x] Right [x]  WFL [x] Left [] Right   KNEES ROM concerns: ROM concerns: Rt knee active flexion 70 degrees   & Dorsi-Flexed                    [] Lt [] Rt                                  FEET Plantar Flexed                  [] Lt [] Rt     Inversion                    [] Lt [] Rt     Eversion                    [] Lt [] Rt    HEAD [x]  Functional [x]  Good Head Control   & []   Flexed         []  Extended []  Adequate Head Control   NECK []  Rotated  Lt  []  Lat Flexed Lt []  Rotated  Rt []  Lat Flexed Rt []  Limited Head Control    []  Cervical Hyperextension []  Absent  Head Control    SHOULDERS ELBOWS WRIST& HAND         Left     Right    Left     Right  U/E [x] Functional  Left            [x] Functional  Right                                 [] Fisting             [] Fisting     [] elevated Left [] depressed  Left [] elevated Right [] depressed  Right      [] protracted Left [] retracted Left [] protracted Right [] retracted Right [] subluxed  Left              [] subluxed  Right         Goals for Wheelchair  Mobility  [x]  Independence with mobility in the home with motor related ADLs (MRADLs)  []  Independence with MRADLs in the community []  Provide dependent mobility  []  Provide recline     [] Provide tilt   Goals for Seating system [x]  Optimize pressure distribution [x]  Provide support needed to facilitate function or safety []  Provide corrective forces to assist with maintaining or improving posture []  Accommodate clients posture: current seated postures and positions are not flexible or will not tolerate corrective forces []  Client to be independent with relieving pressure in the wheelchair [] Enhance physiological function such as breathing, swallowing, digestion  Simulation ideas/Equipment trials:                                                                                                State why other equipment was unsuccessful:                                                                           MOBILITY BASE RECOMMENDATIONS and JUSTIFICATION: MOBILITY COMPONENT JUSTIFICATION  Manufacturer: [employer]             Size: Width  20         Seat Depth  20           [x] provide transport from point A to B [x] promote Indep mobility  [x] is not a safe, functional ambulator [x] walker or cane inadequate [] non-standard width/depth  necessary to accommodate anatomical measurement []                             [] Manual Mobility Base [] non-functional ambulator    [] Scooter/POV  [] can safely operate  [] can safely transfer   [] has adequate trunk stability  [] cannot functionally propel manual w/c  [x] Power Mobility Base  [] non-ambulatory  [x] cannot functionally propel manual wheelchair  [x]  cannot functionally and safely operate scooter/POV [x] can safely operate and willing to  [] Stroller Base [] infant/child  [] unable to propel manual wheelchair [] allows for growth [] non-functional ambulator [] non-functional UE [] Indep mobility is not a goal at this time  [] Tilt  [] Forward                   [] Backward                  [] Powered tilt              [] Manual tilt  [] change position against gravitational force on head and shoulders  [] change position for pressure relief/cannot weight shift [] transfers  [] management of tone [] rest periods [] control edema [] facilitate postural control  []                                       []   Recline  [] Power recline on power base [] Manual recline on manual base  [] accommodate femur to back angle  [] bring to full recline for ADL care  [] change position for pressure relief/cannot weight shift [] rest periods [] repositioning for transfers or clothing/diaper /catheter changes [] head positioning  [] Lighter weight required [] self- propulsion  [] lifting []                                                 [] Heavy Duty required [] user weight greater than 250# [] extreme tone/ over active movement [] broken frame on previous chair []                                     [x]  Back  []  Angle Adjustable []  Custom molded     Captain's seat                      [] postural control [] control of tone/spasticity [] accommodation of range of motion [] UE functional control [x] accommodation for seating system []                                          [] provide lateral trunk support [] accommodate  deformity [x] provide posterior trunk support [x] provide lumbar/sacral support [x] support trunk in midline [x] Pressure relief over spinal processes  [x]  Seat Cushion     Captain's seat                 [] impaired sensation  [] decubitus ulcers present [] history of pressure ulceration [] prevent pelvic extension [x] low maintenance  [] stabilize pelvis  [] accommodate obliquity [] accommodate multiple deformity [x] neutralize lower extremity position [x] increase pressure distribution []                                           []  Pelvic/thigh support  []  Lateral thigh guide []  Distal medial pad  []  Distal lateral pad []  pelvis in neutral [] accommodate pelvis []  position upper legs []  alignment []  accommodate ROM []  decrease adduction [] accommodate tone [] removable for transfers [] decrease abduction  []  Lateral trunk Supports []  Lt     []  Rt [] decrease lateral trunk leaning [] control tone [] contour for increased contact [] safety  [] accommodate asymmetry []                                                 []  Mounting hardware  [] lateral trunk supports  [] back   [] seat [] headrest      []  thigh support [] fixed   [] swing away [] attach seat platform/cushion to w/c frame [] attach back cushion to w/c frame [] mount postural supports [] mount headrest  [] swing medial thigh support away [] swing lateral supports away for transfers  []                                                     Armrests  [] fixed [x] adjustable  height [] removable   [] swing away  [x] flip back   [] reclining [x] full length pads [] desk    [] pads tubular  [x] provide support with elbow at 90   [] provide support for w/c tray [x] change of height/angles for variable activities [] remove for transfers [x] allow to come closer to table top [] remove for access to tables []                                               Hangers/ Leg rests  [] 60 [] 70 [] 90 [] elevating [] heavy duty  [] articulating [] fixed [] lift off [] swing  away     [] power [] provide LE support  [] accommodate to hamstring tightness [] elevate legs during recline   [] provide change in position for Legs [] Maintain placement of feet on footplate [] durability [] enable transfers [] decrease edema [] Accommodate lower leg length []                                         Foot support Footplate    [] Lt  []  Rt  [x]  Center mount [x] flip up                            [x] depth/angle adjustable [] Amputee adapter    []  Lt     []  Rt [x] provide foot support [x] accommodate to ankle ROM [x] transfers [] Provide support for residual extremity []  allow foot to go under wheelchair base []  decrease tone  []                                                 []  Ankle strap/heel loops [] support foot on foot support [] decrease extraneous movement [] provide input to heel  [] protect foot  Tires: [] pneumatic  [x] flat free inserts  [] solid  [x] decrease maintenance  [x] prevent frequent flats [] increase shock absorbency [] decrease pain from road shock [] decrease spasms from road shock []                                              []  Headrest  [] provide posterior head support [] provide posterior neck support [] provide lateral head support [] provide anterior head support [] support during tilt and recline [] improve feeding   [] improve respiration [] placement of switches [] safety  [] accommodate ROM  [] accommodate tone [] improve visual orientation  []  Anterior chest strap []  Vest []  Shoulder retractors  [] decrease forward movement of shoulder [] accommodation of TLSO [] decrease forward movement of trunk [] decrease shoulder elevation [] added abdominal support [] alignment [] assistance with shoulder control  []                                               Pelvic Positioner [x] Belt [] SubASIS bar [] Dual Pull [] stabilize tone [x] decrease falling out of chair/ **will not Decrease potential for sliding due to pelvic tilting [] prevent excessive rotation [] pad for  protection over boney prominence [] prominence comfort [] special pull angle to control rotation []   Upper ExtremitySupport  [] L   []  R [] Arm trough   [] hand support []  tray       [] full tray [] swivel mount [] decrease edema      [] decrease subluxation   [] control tone   [] placement for AAC/Computer/EADL [] decrease gravitational pull on shoulders [] provide midline positioning [] provide support to increase UE function [] provide hand support in natural position [] provide work surface   POWER WHEELCHAIR CONTROLS  [x] Proportional  [] Non-Proportional Type Joystick                                      [x] Left  [] Right [x] provides access for controlling wheelchair   [] lacks motor control to operate proportional drive control [] unable to understand proportional controls  Actuator Control Module  [] Single  [] Multiple   [] Allow the client to operate the power seat function(s) through the joystick control   [] Safety Reset Switches [] Used to change modes and stop the wheelchair when driving in latch mode    [] Upgraded Electronics   [] programming for accurate control [] progressive Disease/changing condition [] non-proportional drive control needed [] Needed in order to operate power seat functions through joystick control   [] Display box [] Allows user to see in which mode and drive the wheelchair is set  [] necessary for alternate controls    [] Digital interface electronics [] Allows w/c to operate when using alternative drive controls  [] ASL Head Array [] Allows client to operate wheelchair  through switches placed in tri-panel headrest  [] Sip and puff with tubing kit [] needed to operate sip and puff drive controls  [] Upgraded tracking electronics [] increase safety when driving [] correct tracking when on uneven surfaces  [x] Mount for switches or joystick [] Attaches switches to w/c  [x] Swing away for access or transfers [] midline for optimal  placement [] provides for consistent access  [] Attendant controlled joystick plus mount [] safety [] long distance driving [] operation of seat functions [] compliance with transportation regulations []                                             Rear wheel placement/Axle adjustability [] None [] semi adjustable [] fully adjustable  [] improved UE access to wheels [] improved stability [] changing angle in space for improvement of postural stability [] 1-arm drive access [] amputee pad placement []                                Wheel rims/ hand rims  [] metal   [] plastic coated [] oblique projections           [] vertical projections [] Provide ability to propel manual wheelchair  []  Increase self-propulsion with hand weakness/decreased grasp  Push handles [] extended   [] angle adjustable              [] standard [] caregiver access [] caregiver assist [] allows hooking to enable increased ability to perform ADLs or maintain balance  One armed device   [] Lt   [] Rt [] enable propulsion of manual wheelchair with one arm   []                                            Brake/wheel lock extension []  Lt   []  Rt [] increase indep in applying wheel locks   [] Side guards [] prevent  clothing getting caught in wheel or becoming soiled []  prevent skin tears/abrasions  Battery: NF 22 x 2                                          [x] to power wheelchair                                                         Other:                                                                                                                        The above equipment has a life- long use expectancy. Growth and changes in medical and/or functional conditions would be the exceptions. This is to certify that the therapist has no financial relationship with durable medical provider or manufacturer. The therapist will not receive remuneration of any kind for the equipment recommended in this evaluation.   Patient has mobility limitation that  significantly impairs safe, timely participation in one or more mobility related ADLs. (bathing, toileting, feeding, dressing, grooming, moving from room to room)  [x]  Yes []  No  Will mobility device sufficiently improve ability to participate and/or be aided in participation of MRADLs?      [x]  Yes []  No  Can limitation be compensated for with use of a cane or walker?                                    []  Yes [x]  No  Does patient or caregiver demonstrate ability/potential ability & willingness to safely use the mobility device?    [x]  Yes []  No  Does patients home environment support use of recommended mobility device?            [x]  Yes []  No  Does patient have sufficient upper extremity function necessary to functionally propel a manual wheelchair?     []  Yes [x]  No  Does patient have sufficient strength and trunk stability to safely operate a POV (scooter)?                                  []  Yes [x]  No  Does patient need additional features/benefits provided by a power wheelchair for MRADLs in the home?        [x]  Yes []  No  Does the patient demonstrate the ability to safely use a power wheelchair?                   [x]  Yes []  No     Physicians Name Printed:  Montie Pizza, MD                                                   Physicians Signature:  Date:     This is to certify that I, the above signed therapist have the following affiliations: []  This DME provider []  Manufacturer of recommended equipment []  Patients long term care facility [x]  None of the above  Therapist Name/Signature: Elvie Kussmaul, PT                                        Date:  02-07-24  ASSESSMENT:  CLINICAL IMPRESSION: Patient is a 73 y.o. lady who was seen today for physical therapy evaluation for power wheelchair.  Recommend a group 2 power wheelchair [employer]).  Josh Cadle, ATP with [facility], present in consult with wheelchair evaluation.      CLINICAL DECISION MAKING:  Evolving/moderate complexity  EVALUATION COMPLEXITY: Moderate   PLAN:  PT FREQUENCY: one time visit  PT DURATION: 1 week  PLANNED INTERVENTIONS: wheelchair evaluation and assistive technology assessment report.  PLAN FOR NEXT SESSION: N/A - eval only   Kussmaul Rock Elvie, PT 02/07/2024, 9:02 PM  "

## 2024-02-07 NOTE — Telephone Encounter (Signed)
 Patient called asking if there was a back brace that Dr Joane would recommend for her? She also asked if there was one with lights and heat.  Please advise.

## 2024-02-08 ENCOUNTER — Ambulatory Visit (HOSPITAL_BASED_OUTPATIENT_CLINIC_OR_DEPARTMENT_OTHER): Admitting: Physical Therapy

## 2024-02-08 NOTE — Telephone Encounter (Signed)
 There are back braces that have heat built into them.  They are typically relatively inexpensive on [employer].

## 2024-02-10 ENCOUNTER — Ambulatory Visit (HOSPITAL_BASED_OUTPATIENT_CLINIC_OR_DEPARTMENT_OTHER): Admitting: Physical Therapy

## 2024-02-13 ENCOUNTER — Ambulatory Visit (HOSPITAL_BASED_OUTPATIENT_CLINIC_OR_DEPARTMENT_OTHER): Admitting: Physical Therapy

## 2024-02-14 ENCOUNTER — Encounter (HOSPITAL_BASED_OUTPATIENT_CLINIC_OR_DEPARTMENT_OTHER): Attending: Internal Medicine | Admitting: Internal Medicine

## 2024-02-14 DIAGNOSIS — I87311 Chronic venous hypertension (idiopathic) with ulcer of right lower extremity: Secondary | ICD-10-CM | POA: Insufficient documentation

## 2024-02-14 DIAGNOSIS — I890 Lymphedema, not elsewhere classified: Secondary | ICD-10-CM | POA: Insufficient documentation

## 2024-02-14 DIAGNOSIS — L97812 Non-pressure chronic ulcer of other part of right lower leg with fat layer exposed: Secondary | ICD-10-CM | POA: Diagnosis present

## 2024-02-14 DIAGNOSIS — X58XXXA Exposure to other specified factors, initial encounter: Secondary | ICD-10-CM | POA: Diagnosis not present

## 2024-02-14 DIAGNOSIS — I639 Cerebral infarction, unspecified: Secondary | ICD-10-CM | POA: Insufficient documentation

## 2024-02-14 DIAGNOSIS — Z96651 Presence of right artificial knee joint: Secondary | ICD-10-CM | POA: Diagnosis not present

## 2024-02-14 DIAGNOSIS — T798XXA Other early complications of trauma, initial encounter: Secondary | ICD-10-CM | POA: Insufficient documentation

## 2024-02-15 ENCOUNTER — Ambulatory Visit (HOSPITAL_BASED_OUTPATIENT_CLINIC_OR_DEPARTMENT_OTHER): Admitting: Physical Therapy

## 2024-02-21 ENCOUNTER — Encounter (HOSPITAL_BASED_OUTPATIENT_CLINIC_OR_DEPARTMENT_OTHER): Attending: Internal Medicine | Admitting: Internal Medicine

## 2024-02-21 ENCOUNTER — Ambulatory Visit (INDEPENDENT_AMBULATORY_CARE_PROVIDER_SITE_OTHER): Admitting: Family Medicine

## 2024-02-21 VITALS — BP 136/76 | HR 77 | Ht 64.0 in

## 2024-02-21 DIAGNOSIS — M5442 Lumbago with sciatica, left side: Secondary | ICD-10-CM | POA: Diagnosis not present

## 2024-02-21 DIAGNOSIS — G8929 Other chronic pain: Secondary | ICD-10-CM | POA: Diagnosis not present

## 2024-02-21 DIAGNOSIS — L97812 Non-pressure chronic ulcer of other part of right lower leg with fat layer exposed: Secondary | ICD-10-CM | POA: Insufficient documentation

## 2024-02-21 DIAGNOSIS — M25562 Pain in left knee: Secondary | ICD-10-CM

## 2024-02-21 DIAGNOSIS — I890 Lymphedema, not elsewhere classified: Secondary | ICD-10-CM | POA: Insufficient documentation

## 2024-02-21 DIAGNOSIS — M5441 Lumbago with sciatica, right side: Secondary | ICD-10-CM | POA: Diagnosis not present

## 2024-02-21 DIAGNOSIS — Z96653 Presence of artificial knee joint, bilateral: Secondary | ICD-10-CM | POA: Diagnosis not present

## 2024-02-21 DIAGNOSIS — I87311 Chronic venous hypertension (idiopathic) with ulcer of right lower extremity: Secondary | ICD-10-CM | POA: Diagnosis not present

## 2024-02-21 DIAGNOSIS — X58XXXA Exposure to other specified factors, initial encounter: Secondary | ICD-10-CM | POA: Diagnosis not present

## 2024-02-21 DIAGNOSIS — T798XXA Other early complications of trauma, initial encounter: Secondary | ICD-10-CM | POA: Insufficient documentation

## 2024-02-21 DIAGNOSIS — G8928 Other chronic postprocedural pain: Secondary | ICD-10-CM

## 2024-02-21 DIAGNOSIS — M25561 Pain in right knee: Secondary | ICD-10-CM | POA: Diagnosis not present

## 2024-02-21 DIAGNOSIS — Z96651 Presence of right artificial knee joint: Secondary | ICD-10-CM | POA: Diagnosis not present

## 2024-02-21 DIAGNOSIS — Z8673 Personal history of transient ischemic attack (TIA), and cerebral infarction without residual deficits: Secondary | ICD-10-CM | POA: Diagnosis not present

## 2024-02-21 NOTE — Progress Notes (Signed)
"       ° °  LILLETTE Ileana Collet, PhD, LAT, ATC acting as a scribe for Artist Lloyd, MD.  [name] [name] is a 74 y.o. female who presents to [facility] today for f/u LBP. Pt was last seen for her back on 11/16/23 and was referred to aquatic PT. Also advised to use compression stockings for her lymphedema.  Today, pt reports legs are pretty painful and very swollen. She is being seen at wound center to manage some breaks in the skin on her legs. PT wanted her to address the wounds on her legs prior to using the pool for aqua therapy.  She continues to have difficulty with bilateral knee pain following knee replacements as well as lateral leg pain bilaterally and skin breakdown and lymphedema.  Pertinent review of systems: No fevers or chills  Relevant historical information: History of stroke.  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 Mostly wheelchair-bound.  Exam:  BP 136/76   Pulse 77   Ht 5' 4 (1.626 m)   SpO2 96%   BMI 46.35 kg/m  General: Well Developed, well nourished, and in no acute distress.   MSK: L-spine decreased lumbar motion lower extremity strength is decreased. Knees bilaterally decreased range of motion. Lower extremities complicated by lymphedema.    Lab and Radiology Results  Bilateral knee x-ray from October 2025 reviewed and lumbar spine x-ray from August 2025 reviewed.    Assessment and Plan: 74 y.o. female with Bilateral lower extremity pain concerning for from knee replacement and pain from lumbar radiculopathy concerning for L5 bilaterally.  Additionally pain could be due to skin breakdown managed by wound care at this time.  Will treat for multiple issues.  For knee pain will proceed to evaluation for genicular artery embolization.  For possible L5 lumbar radiculopathy will proceed to lumbar spine MRI.  She notes that she will need moderate sedation in the hospital based on prior experience with  MRI.  Will go ahead and arrange for that.  Recheck after MRI results come back.   PDMP not reviewed this encounter. Orders Placed This Encounter  Procedures   MR Lumbar Spine Wo Contrast    Standing Status:   Future    Expiration Date:   02/20/2025    What is the patient's sedation requirement?:   General Anesthesia (available ONLY at [facility])    Does the patient have a pacemaker or implanted devices?:   No    Preferred imaging location?:   [facility] (table limit - 500lbs)   IR Radiologist Eval & Mgmt    Standing Status:   Future    Expiration Date:   02/20/2025    Reason for Exam (SYMPTOM  OR DIAGNOSIS REQUIRED):   chronic knee pain    Preferred Imaging Location?:   [facility]   No orders of the defined types were placed in this encounter.    Discussed warning signs or symptoms. Please see discharge instructions. Patient expresses understanding.   The above documentation has been reviewed and is accurate and complete Artist Lloyd, M.D. Total encounter time 30 minutes including face-to-face time with the patient and, reviewing past medical record, and charting on the date of service.     "

## 2024-02-21 NOTE — Patient Instructions (Addendum)
 Thank you for coming in today.   You should hear from MRI scheduling within 1 week. If you do not hear please let me know.    I've placed an order for a Genicular Artery Embolization procedure consult. You should hear soon about scheduling.   Check back after we get the MRI results back

## 2024-02-23 ENCOUNTER — Telehealth (HOSPITAL_COMMUNITY): Payer: Self-pay | Admitting: Family Medicine

## 2024-02-23 NOTE — Telephone Encounter (Signed)
 Called pt to schedule MRI w/ general anesthesia- Made pt aware that next available MRI appointment w/ general anesthesia is in June/ July . She did not want to wait that long and requested I send a message to ordering provider to see what they would advise because she is very claustrophobic. Message sent to ordering office.

## 2024-02-24 NOTE — Telephone Encounter (Signed)
 After discussion we will offer CT myelogram

## 2024-02-28 ENCOUNTER — Encounter (HOSPITAL_BASED_OUTPATIENT_CLINIC_OR_DEPARTMENT_OTHER): Admitting: Internal Medicine

## 2024-02-28 DIAGNOSIS — L97812 Non-pressure chronic ulcer of other part of right lower leg with fat layer exposed: Secondary | ICD-10-CM

## 2024-02-28 DIAGNOSIS — I87311 Chronic venous hypertension (idiopathic) with ulcer of right lower extremity: Secondary | ICD-10-CM

## 2024-02-28 DIAGNOSIS — I890 Lymphedema, not elsewhere classified: Secondary | ICD-10-CM | POA: Diagnosis not present

## 2024-02-28 DIAGNOSIS — T798XXA Other early complications of trauma, initial encounter: Secondary | ICD-10-CM | POA: Diagnosis not present

## 2024-02-28 NOTE — Telephone Encounter (Signed)
 Spoke to pt to inform her of the revised imaging plan. Pt mentioned something about having to previously take some pills that could be addictive. She wanted to avoid this and was agreeable to the plan.

## 2024-02-28 NOTE — Addendum Note (Signed)
 Addended by: GEROME ILEANA RAMAN on: 02/28/2024 01:49 PM   Modules accepted: Orders

## 2024-03-06 ENCOUNTER — Encounter (HOSPITAL_BASED_OUTPATIENT_CLINIC_OR_DEPARTMENT_OTHER): Admitting: Internal Medicine

## 2024-03-06 DIAGNOSIS — I890 Lymphedema, not elsewhere classified: Secondary | ICD-10-CM | POA: Diagnosis not present

## 2024-03-06 DIAGNOSIS — T798XXA Other early complications of trauma, initial encounter: Secondary | ICD-10-CM

## 2024-03-06 DIAGNOSIS — L97812 Non-pressure chronic ulcer of other part of right lower leg with fat layer exposed: Secondary | ICD-10-CM | POA: Diagnosis not present

## 2024-03-06 DIAGNOSIS — I87311 Chronic venous hypertension (idiopathic) with ulcer of right lower extremity: Secondary | ICD-10-CM | POA: Diagnosis not present

## 2024-03-09 NOTE — Discharge Instructions (Signed)

## 2024-03-12 ENCOUNTER — Inpatient Hospital Stay
Admission: RE | Admit: 2024-03-12 | Discharge: 2024-03-12 | Disposition: A | Discharge status: Home / Self Care | Source: Ambulatory Visit | Attending: Family Medicine | Admitting: Family Medicine

## 2024-03-12 ENCOUNTER — Inpatient Hospital Stay: Admission: RE | Admit: 2024-03-12 | Source: Ambulatory Visit

## 2024-03-13 ENCOUNTER — Encounter (HOSPITAL_BASED_OUTPATIENT_CLINIC_OR_DEPARTMENT_OTHER): Admitting: Internal Medicine

## 2024-03-19 ENCOUNTER — Encounter (HOSPITAL_BASED_OUTPATIENT_CLINIC_OR_DEPARTMENT_OTHER): Admitting: Internal Medicine

## 2024-03-21 NOTE — Discharge Instructions (Signed)

## 2024-03-22 ENCOUNTER — Ambulatory Visit
Admission: RE | Admit: 2024-03-22 | Discharge: 2024-03-22 | Disposition: A | Source: Ambulatory Visit | Attending: Family Medicine | Admitting: Family Medicine

## 2024-03-22 ENCOUNTER — Inpatient Hospital Stay
Admission: RE | Admit: 2024-03-22 | Discharge: 2024-03-22 | Disposition: A | Source: Ambulatory Visit | Attending: Family Medicine | Admitting: Family Medicine

## 2024-03-22 DIAGNOSIS — G8929 Other chronic pain: Secondary | ICD-10-CM

## 2024-03-22 MED ORDER — DIAZEPAM 5 MG PO TABS: 5.0000 mg | ORAL_TABLET | Freq: Once | ORAL | Status: DC

## 2024-03-22 MED ORDER — MEPERIDINE HCL 50 MG/ML IJ SOLN: 50.0000 mg | Freq: Once | INTRAMUSCULAR | Status: DC | PRN

## 2024-03-22 MED ORDER — ONDANSETRON HCL 4 MG/2ML IJ SOLN: 4.0000 mg | Freq: Once | INTRAMUSCULAR | Status: DC | PRN

## 2024-03-22 MED ORDER — IOPAMIDOL (ISOVUE-M 200) INJECTION 41%
20.0000 mL | Freq: Once | INTRAMUSCULAR | Status: AC
  Administered 2024-03-22: 20 mL via INTRATHECAL

## 2024-03-26 ENCOUNTER — Encounter (HOSPITAL_BASED_OUTPATIENT_CLINIC_OR_DEPARTMENT_OTHER): Admitting: Internal Medicine
